# Patient Record
Sex: Male | Born: 1950 | Race: White | Hispanic: No | Marital: Married | Smoking: Former smoker
Health system: Southern US, Community
[De-identification: ages and names within clinical notes are randomized; demographics above are authoritative.]

## PROBLEM LIST (undated history)

## (undated) DIAGNOSIS — E785 Hyperlipidemia, unspecified: Secondary | ICD-10-CM

## (undated) DIAGNOSIS — R51 Headache: Secondary | ICD-10-CM

## (undated) DIAGNOSIS — N390 Urinary tract infection, site not specified: Secondary | ICD-10-CM

## (undated) DIAGNOSIS — T4145XA Adverse effect of unspecified anesthetic, initial encounter: Secondary | ICD-10-CM

## (undated) DIAGNOSIS — M5136 Other intervertebral disc degeneration, lumbar region: Secondary | ICD-10-CM

## (undated) DIAGNOSIS — R29818 Other symptoms and signs involving the nervous system: Secondary | ICD-10-CM

## (undated) DIAGNOSIS — Z8614 Personal history of Methicillin resistant Staphylococcus aureus infection: Secondary | ICD-10-CM

## (undated) DIAGNOSIS — R29898 Other symptoms and signs involving the musculoskeletal system: Secondary | ICD-10-CM

## (undated) DIAGNOSIS — G894 Chronic pain syndrome: Secondary | ICD-10-CM

## (undated) DIAGNOSIS — I739 Peripheral vascular disease, unspecified: Secondary | ICD-10-CM

## (undated) DIAGNOSIS — I5032 Chronic diastolic (congestive) heart failure: Secondary | ICD-10-CM

## (undated) DIAGNOSIS — N401 Enlarged prostate with lower urinary tract symptoms: Secondary | ICD-10-CM

## (undated) DIAGNOSIS — Z8701 Personal history of pneumonia (recurrent): Secondary | ICD-10-CM

## (undated) DIAGNOSIS — F329 Major depressive disorder, single episode, unspecified: Secondary | ICD-10-CM

## (undated) DIAGNOSIS — R0609 Other forms of dyspnea: Secondary | ICD-10-CM

## (undated) DIAGNOSIS — K000 Anodontia: Secondary | ICD-10-CM

## (undated) DIAGNOSIS — Z87442 Personal history of urinary calculi: Secondary | ICD-10-CM

## (undated) DIAGNOSIS — G479 Sleep disorder, unspecified: Secondary | ICD-10-CM

## (undated) DIAGNOSIS — K219 Gastro-esophageal reflux disease without esophagitis: Secondary | ICD-10-CM

## (undated) DIAGNOSIS — K760 Fatty (change of) liver, not elsewhere classified: Secondary | ICD-10-CM

## (undated) DIAGNOSIS — M1990 Unspecified osteoarthritis, unspecified site: Secondary | ICD-10-CM

## (undated) DIAGNOSIS — I6529 Occlusion and stenosis of unspecified carotid artery: Secondary | ICD-10-CM

## (undated) DIAGNOSIS — I10 Essential (primary) hypertension: Secondary | ICD-10-CM

## (undated) DIAGNOSIS — J449 Chronic obstructive pulmonary disease, unspecified: Secondary | ICD-10-CM

## (undated) DIAGNOSIS — N281 Cyst of kidney, acquired: Secondary | ICD-10-CM

## (undated) DIAGNOSIS — C801 Malignant (primary) neoplasm, unspecified: Secondary | ICD-10-CM

## (undated) DIAGNOSIS — M48062 Spinal stenosis, lumbar region with neurogenic claudication: Secondary | ICD-10-CM

## (undated) DIAGNOSIS — Z8639 Personal history of other endocrine, nutritional and metabolic disease: Secondary | ICD-10-CM

## (undated) DIAGNOSIS — Z9189 Other specified personal risk factors, not elsewhere classified: Secondary | ICD-10-CM

## (undated) DIAGNOSIS — G9519 Other vascular myelopathies: Secondary | ICD-10-CM

## (undated) DIAGNOSIS — A048 Other specified bacterial intestinal infections: Secondary | ICD-10-CM

## (undated) DIAGNOSIS — G629 Polyneuropathy, unspecified: Secondary | ICD-10-CM

## (undated) DIAGNOSIS — Z89519 Acquired absence of unspecified leg below knee: Secondary | ICD-10-CM

## (undated) DIAGNOSIS — T8859XA Other complications of anesthesia, initial encounter: Secondary | ICD-10-CM

## (undated) DIAGNOSIS — K08109 Complete loss of teeth, unspecified cause, unspecified class: Secondary | ICD-10-CM

## (undated) DIAGNOSIS — N529 Male erectile dysfunction, unspecified: Secondary | ICD-10-CM

## (undated) DIAGNOSIS — G8929 Other chronic pain: Secondary | ICD-10-CM

## (undated) DIAGNOSIS — K449 Diaphragmatic hernia without obstruction or gangrene: Secondary | ICD-10-CM

## (undated) DIAGNOSIS — R600 Localized edema: Secondary | ICD-10-CM

## (undated) DIAGNOSIS — K3184 Gastroparesis: Secondary | ICD-10-CM

## (undated) DIAGNOSIS — Z993 Dependence on wheelchair: Secondary | ICD-10-CM

## (undated) DIAGNOSIS — K222 Esophageal obstruction: Secondary | ICD-10-CM

## (undated) DIAGNOSIS — R519 Headache, unspecified: Secondary | ICD-10-CM

## (undated) DIAGNOSIS — N200 Calculus of kidney: Secondary | ICD-10-CM

## (undated) DIAGNOSIS — T884XXA Failed or difficult intubation, initial encounter: Secondary | ICD-10-CM

## (undated) DIAGNOSIS — E119 Type 2 diabetes mellitus without complications: Secondary | ICD-10-CM

## (undated) DIAGNOSIS — T7840XA Allergy, unspecified, initial encounter: Secondary | ICD-10-CM

## (undated) DIAGNOSIS — F419 Anxiety disorder, unspecified: Secondary | ICD-10-CM

## (undated) DIAGNOSIS — M51369 Other intervertebral disc degeneration, lumbar region without mention of lumbar back pain or lower extremity pain: Secondary | ICD-10-CM

## (undated) DIAGNOSIS — Z8619 Personal history of other infectious and parasitic diseases: Secondary | ICD-10-CM

## (undated) DIAGNOSIS — M722 Plantar fascial fibromatosis: Secondary | ICD-10-CM

## (undated) DIAGNOSIS — G5600 Carpal tunnel syndrome, unspecified upper limb: Secondary | ICD-10-CM

## (undated) DIAGNOSIS — F32A Depression, unspecified: Secondary | ICD-10-CM

## (undated) DIAGNOSIS — I2510 Atherosclerotic heart disease of native coronary artery without angina pectoris: Secondary | ICD-10-CM

## (undated) DIAGNOSIS — Z7409 Other reduced mobility: Secondary | ICD-10-CM

## (undated) DIAGNOSIS — N400 Enlarged prostate without lower urinary tract symptoms: Secondary | ICD-10-CM

## (undated) HISTORY — DX: Occlusion and stenosis of unspecified carotid artery: I65.29

## (undated) HISTORY — DX: Benign prostatic hyperplasia without lower urinary tract symptoms: N40.0

## (undated) HISTORY — DX: Allergy, unspecified, initial encounter: T78.40XA

## (undated) HISTORY — PX: TONSILLECTOMY: SUR1361

## (undated) HISTORY — DX: Urinary tract infection, site not specified: N39.0

## (undated) HISTORY — DX: Other specified bacterial intestinal infections: A04.8

## (undated) HISTORY — PX: ESOPHAGOGASTRODUODENOSCOPY (EGD) WITH ESOPHAGEAL DILATION: SHX5812

## (undated) HISTORY — PX: CATARACT EXTRACTION W/ INTRAOCULAR LENS  IMPLANT, BILATERAL: SHX1307

## (undated) HISTORY — DX: Carpal tunnel syndrome, unspecified upper limb: G56.00

## (undated) HISTORY — DX: Headache, unspecified: R51.9

## (undated) HISTORY — DX: Plantar fascial fibromatosis: M72.2

## (undated) HISTORY — PX: OTHER SURGICAL HISTORY: SHX169

## (undated) HISTORY — PX: CARDIOVASCULAR STRESS TEST: SHX262

## (undated) HISTORY — PX: PARATHYROIDECTOMY: SHX19

## (undated) HISTORY — PX: LUMBAR DISC SURGERY: SHX700

## (undated) HISTORY — PX: ESOPHAGOGASTRECTOMY: SHX6315

## (undated) HISTORY — DX: Fatty (change of) liver, not elsewhere classified: K76.0

## (undated) HISTORY — DX: Headache: R51

## (undated) HISTORY — DX: Other chronic pain: G89.29

## (undated) HISTORY — DX: Anxiety disorder, unspecified: F41.9

## (undated) HISTORY — PX: APPENDECTOMY: SHX54

---

## 1999-05-07 ENCOUNTER — Emergency Department (HOSPITAL_COMMUNITY): Admission: EM | Admit: 1999-05-07 | Discharge: 1999-05-07 | Payer: Self-pay | Admitting: Emergency Medicine

## 1999-05-15 ENCOUNTER — Encounter: Payer: Self-pay | Admitting: General Surgery

## 1999-05-15 ENCOUNTER — Ambulatory Visit (HOSPITAL_COMMUNITY): Admission: RE | Admit: 1999-05-15 | Discharge: 1999-05-15 | Payer: Self-pay | Admitting: General Surgery

## 1999-05-16 ENCOUNTER — Encounter: Payer: Self-pay | Admitting: General Surgery

## 1999-05-16 ENCOUNTER — Ambulatory Visit (HOSPITAL_COMMUNITY): Admission: RE | Admit: 1999-05-16 | Discharge: 1999-05-16 | Payer: Self-pay | Admitting: General Surgery

## 2000-05-25 ENCOUNTER — Encounter: Payer: Self-pay | Admitting: Endocrinology

## 2000-05-25 ENCOUNTER — Ambulatory Visit (HOSPITAL_COMMUNITY): Admission: RE | Admit: 2000-05-25 | Discharge: 2000-05-25 | Payer: Self-pay | Admitting: Endocrinology

## 2000-09-03 ENCOUNTER — Encounter: Payer: Self-pay | Admitting: Neurosurgery

## 2000-09-03 ENCOUNTER — Encounter: Admission: RE | Admit: 2000-09-03 | Discharge: 2000-09-03 | Payer: Self-pay | Admitting: Neurosurgery

## 2002-05-19 ENCOUNTER — Encounter: Payer: Self-pay | Admitting: Endocrinology

## 2002-05-19 ENCOUNTER — Encounter: Admission: RE | Admit: 2002-05-19 | Discharge: 2002-05-19 | Payer: Self-pay | Admitting: Endocrinology

## 2002-09-04 ENCOUNTER — Emergency Department (HOSPITAL_COMMUNITY): Admission: EM | Admit: 2002-09-04 | Discharge: 2002-09-04 | Payer: Self-pay | Admitting: Emergency Medicine

## 2002-09-04 ENCOUNTER — Encounter: Payer: Self-pay | Admitting: Emergency Medicine

## 2003-04-12 ENCOUNTER — Inpatient Hospital Stay (HOSPITAL_COMMUNITY): Admission: AD | Admit: 2003-04-12 | Discharge: 2003-04-14 | Payer: Self-pay | Admitting: Family Medicine

## 2003-04-12 ENCOUNTER — Encounter: Payer: Self-pay | Admitting: Family Medicine

## 2003-04-14 ENCOUNTER — Encounter: Admission: RE | Admit: 2003-04-14 | Discharge: 2003-04-14 | Payer: Self-pay | Admitting: Family Medicine

## 2004-07-16 ENCOUNTER — Ambulatory Visit: Payer: Self-pay | Admitting: Internal Medicine

## 2004-07-24 ENCOUNTER — Ambulatory Visit: Payer: Self-pay | Admitting: Endocrinology

## 2004-08-05 ENCOUNTER — Ambulatory Visit: Payer: Self-pay | Admitting: Internal Medicine

## 2004-08-05 LAB — HM COLONOSCOPY: HM Colonoscopy: NORMAL

## 2004-08-14 ENCOUNTER — Ambulatory Visit: Payer: Self-pay | Admitting: Endocrinology

## 2005-03-20 ENCOUNTER — Ambulatory Visit: Payer: Self-pay | Admitting: Endocrinology

## 2005-04-03 ENCOUNTER — Ambulatory Visit: Payer: Self-pay | Admitting: Endocrinology

## 2005-04-08 ENCOUNTER — Ambulatory Visit: Payer: Self-pay | Admitting: Cardiology

## 2005-04-15 ENCOUNTER — Ambulatory Visit: Payer: Self-pay | Admitting: Endocrinology

## 2005-05-05 ENCOUNTER — Ambulatory Visit: Payer: Self-pay | Admitting: Endocrinology

## 2005-05-08 ENCOUNTER — Ambulatory Visit (HOSPITAL_COMMUNITY): Admission: RE | Admit: 2005-05-08 | Discharge: 2005-05-08 | Payer: Self-pay | Admitting: Otolaryngology

## 2005-05-08 ENCOUNTER — Ambulatory Visit (HOSPITAL_BASED_OUTPATIENT_CLINIC_OR_DEPARTMENT_OTHER): Admission: RE | Admit: 2005-05-08 | Discharge: 2005-05-08 | Payer: Self-pay | Admitting: Otolaryngology

## 2005-05-08 ENCOUNTER — Encounter (INDEPENDENT_AMBULATORY_CARE_PROVIDER_SITE_OTHER): Payer: Self-pay | Admitting: *Deleted

## 2005-05-26 ENCOUNTER — Ambulatory Visit: Payer: Self-pay | Admitting: Endocrinology

## 2005-06-04 ENCOUNTER — Encounter: Admission: RE | Admit: 2005-06-04 | Discharge: 2005-06-04 | Payer: Self-pay | Admitting: Otolaryngology

## 2005-07-21 ENCOUNTER — Ambulatory Visit: Payer: Self-pay | Admitting: Endocrinology

## 2005-07-22 ENCOUNTER — Ambulatory Visit: Payer: Self-pay | Admitting: Endocrinology

## 2005-09-15 HISTORY — PX: CARPAL TUNNEL RELEASE: SHX101

## 2005-09-15 HISTORY — PX: KNEE ARTHROSCOPY: SUR90

## 2006-01-01 ENCOUNTER — Ambulatory Visit: Payer: Self-pay | Admitting: Endocrinology

## 2006-01-29 ENCOUNTER — Ambulatory Visit: Payer: Self-pay | Admitting: Endocrinology

## 2006-02-16 ENCOUNTER — Encounter: Admission: RE | Admit: 2006-02-16 | Discharge: 2006-02-16 | Payer: Self-pay | Admitting: Orthopedic Surgery

## 2006-04-02 ENCOUNTER — Ambulatory Visit: Payer: Self-pay | Admitting: Endocrinology

## 2006-05-14 ENCOUNTER — Ambulatory Visit (HOSPITAL_BASED_OUTPATIENT_CLINIC_OR_DEPARTMENT_OTHER): Admission: RE | Admit: 2006-05-14 | Discharge: 2006-05-14 | Payer: Self-pay | Admitting: Orthopedic Surgery

## 2006-05-14 HISTORY — PX: TRIGGER FINGER RELEASE: SHX641

## 2006-09-28 ENCOUNTER — Ambulatory Visit: Payer: Self-pay | Admitting: Endocrinology

## 2006-09-28 LAB — CONVERTED CEMR LAB
CO2: 27 meq/L (ref 19–32)
Calcium: 9 mg/dL (ref 8.4–10.5)
Chloride: 100 meq/L (ref 96–112)
GFR calc non Af Amer: 74 mL/min
Glucose, Bld: 402 mg/dL — ABNORMAL HIGH (ref 70–99)
Pro B Natriuretic peptide (BNP): 13 pg/mL (ref 0.0–100.0)

## 2006-10-01 ENCOUNTER — Ambulatory Visit: Payer: Self-pay

## 2006-10-02 ENCOUNTER — Ambulatory Visit: Payer: Self-pay

## 2006-10-05 ENCOUNTER — Ambulatory Visit: Payer: Self-pay | Admitting: Endocrinology

## 2006-11-04 ENCOUNTER — Ambulatory Visit: Payer: Self-pay | Admitting: Endocrinology

## 2006-11-24 ENCOUNTER — Ambulatory Visit: Payer: Self-pay | Admitting: Vascular Surgery

## 2006-11-24 ENCOUNTER — Ambulatory Visit (HOSPITAL_COMMUNITY): Admission: RE | Admit: 2006-11-24 | Discharge: 2006-11-24 | Payer: Self-pay | Admitting: Endocrinology

## 2006-11-24 ENCOUNTER — Ambulatory Visit: Payer: Self-pay | Admitting: Endocrinology

## 2007-01-06 ENCOUNTER — Ambulatory Visit (HOSPITAL_COMMUNITY): Admission: RE | Admit: 2007-01-06 | Discharge: 2007-01-06 | Payer: Self-pay | Admitting: Surgery

## 2007-01-08 ENCOUNTER — Ambulatory Visit (HOSPITAL_COMMUNITY): Admission: RE | Admit: 2007-01-08 | Discharge: 2007-01-08 | Payer: Self-pay | Admitting: Surgery

## 2007-01-12 ENCOUNTER — Encounter: Admission: RE | Admit: 2007-01-12 | Discharge: 2007-04-12 | Payer: Self-pay | Admitting: Internal Medicine

## 2007-03-29 ENCOUNTER — Encounter: Payer: Self-pay | Admitting: Endocrinology

## 2007-03-29 DIAGNOSIS — I10 Essential (primary) hypertension: Secondary | ICD-10-CM

## 2007-04-19 ENCOUNTER — Ambulatory Visit: Payer: Self-pay | Admitting: Endocrinology

## 2007-07-14 ENCOUNTER — Ambulatory Visit: Payer: Self-pay | Admitting: Endocrinology

## 2007-08-17 ENCOUNTER — Encounter: Payer: Self-pay | Admitting: Endocrinology

## 2007-09-16 HISTORY — PX: OTHER SURGICAL HISTORY: SHX169

## 2007-09-16 HISTORY — PX: TRACHEOSTOMY: SUR1362

## 2007-10-18 ENCOUNTER — Encounter: Payer: Self-pay | Admitting: Endocrinology

## 2007-10-18 ENCOUNTER — Ambulatory Visit: Payer: Self-pay | Admitting: Endocrinology

## 2007-10-19 LAB — CONVERTED CEMR LAB
BUN: 10 mg/dL (ref 6–23)
CO2: 29 meq/L (ref 19–32)
Calcium: 9.2 mg/dL (ref 8.4–10.5)
Creatinine, Ser: 0.9 mg/dL (ref 0.4–1.5)
GFR calc Af Amer: 113 mL/min
Hgb A1c MFr Bld: 8.5 % — ABNORMAL HIGH (ref 4.6–6.0)
Sed Rate: 34 mm/hr — ABNORMAL HIGH (ref 0–20)

## 2008-01-21 ENCOUNTER — Ambulatory Visit: Payer: Self-pay | Admitting: Endocrinology

## 2008-01-21 DIAGNOSIS — E210 Primary hyperparathyroidism: Secondary | ICD-10-CM

## 2008-01-21 DIAGNOSIS — M5137 Other intervertebral disc degeneration, lumbosacral region: Secondary | ICD-10-CM

## 2008-01-23 LAB — CONVERTED CEMR LAB
AST: 30 units/L (ref 0–37)
Albumin: 3.7 g/dL (ref 3.5–5.2)
Alkaline Phosphatase: 99 units/L (ref 39–117)
BUN: 15 mg/dL (ref 6–23)
Bacteria, UA: NEGATIVE
Basophils Absolute: 0.1 10*3/uL (ref 0.0–0.1)
Basophils Relative: 0.5 % (ref 0.0–1.0)
Bilirubin Urine: NEGATIVE
Cholesterol: 102 mg/dL (ref 0–200)
Creatinine, Ser: 1 mg/dL (ref 0.4–1.5)
Creatinine,U: 61.2 mg/dL
Crystals: NEGATIVE
Eosinophils Absolute: 0.2 10*3/uL (ref 0.0–0.7)
Eosinophils Relative: 1.3 % (ref 0.0–5.0)
GFR calc Af Amer: 100 mL/min
GFR calc non Af Amer: 83 mL/min
Glucose, Bld: 61 mg/dL — ABNORMAL LOW (ref 70–99)
HCT: 43.3 % (ref 39.0–52.0)
Hemoglobin, Urine: NEGATIVE
Hemoglobin: 14.5 g/dL (ref 13.0–17.0)
Hgb A1c MFr Bld: 9.2 % — ABNORMAL HIGH (ref 4.6–6.0)
Ketones, ur: NEGATIVE mg/dL
Leukocytes, UA: NEGATIVE
MCHC: 33.5 g/dL (ref 30.0–36.0)
MCV: 90.7 fL (ref 78.0–100.0)
Microalb Creat Ratio: 27.8 mg/g (ref 0.0–30.0)
Monocytes Absolute: 1.4 10*3/uL — ABNORMAL HIGH (ref 0.1–1.0)
Neutro Abs: 5.3 10*3/uL (ref 1.4–7.7)
Neutrophils Relative %: 39.4 % — ABNORMAL LOW (ref 43.0–77.0)
Potassium: 4.1 meq/L (ref 3.5–5.1)
RBC: 4.78 M/uL (ref 4.22–5.81)
Total Protein: 6.8 g/dL (ref 6.0–8.3)
Urine Glucose: NEGATIVE mg/dL
Urobilinogen, UA: 0.2 (ref 0.0–1.0)
VLDL: 17 mg/dL (ref 0–40)

## 2008-01-31 ENCOUNTER — Encounter: Admission: RE | Admit: 2008-01-31 | Discharge: 2008-01-31 | Payer: Self-pay | Admitting: Endocrinology

## 2008-02-09 ENCOUNTER — Telehealth (INDEPENDENT_AMBULATORY_CARE_PROVIDER_SITE_OTHER): Payer: Self-pay | Admitting: *Deleted

## 2008-02-14 ENCOUNTER — Encounter (INDEPENDENT_AMBULATORY_CARE_PROVIDER_SITE_OTHER): Payer: Self-pay | Admitting: *Deleted

## 2008-02-29 ENCOUNTER — Telehealth: Payer: Self-pay | Admitting: Endocrinology

## 2008-03-24 ENCOUNTER — Encounter
Admission: RE | Admit: 2008-03-24 | Discharge: 2008-04-10 | Payer: Self-pay | Admitting: Physical Medicine & Rehabilitation

## 2008-03-27 ENCOUNTER — Ambulatory Visit: Payer: Self-pay | Admitting: Physical Medicine & Rehabilitation

## 2008-04-10 ENCOUNTER — Ambulatory Visit: Payer: Self-pay | Admitting: Physical Medicine & Rehabilitation

## 2008-04-20 ENCOUNTER — Ambulatory Visit: Payer: Self-pay | Admitting: Endocrinology

## 2008-04-20 LAB — CONVERTED CEMR LAB: Hgb A1c MFr Bld: 9.7 % — ABNORMAL HIGH (ref 4.6–6.0)

## 2008-07-18 ENCOUNTER — Encounter: Payer: Self-pay | Admitting: Endocrinology

## 2008-09-18 ENCOUNTER — Ambulatory Visit: Payer: Self-pay | Admitting: Endocrinology

## 2008-09-18 DIAGNOSIS — E7800 Pure hypercholesterolemia, unspecified: Secondary | ICD-10-CM

## 2008-09-18 LAB — CONVERTED CEMR LAB
Albumin: 3.2 g/dL — ABNORMAL LOW (ref 3.5–5.2)
BUN: 9 mg/dL (ref 6–23)
Calcium: 10 mg/dL (ref 8.4–10.5)
Cholesterol: 102 mg/dL (ref 0–200)
Creatinine, Ser: 0.9 mg/dL (ref 0.4–1.5)
GFR calc Af Amer: 113 mL/min
GFR calc non Af Amer: 93 mL/min
Glucose, Bld: 136 mg/dL — ABNORMAL HIGH (ref 70–99)
HDL: 31.1 mg/dL — ABNORMAL LOW (ref >39.0)
Hgb A1c MFr Bld: 7.7 % — ABNORMAL HIGH (ref 4.6–6.0)
LDL Cholesterol: 44 mg/dL (ref 0–99)
Total Protein: 6.7 g/dL (ref 6.0–8.3)
VLDL: 27 mg/dL (ref 0–40)

## 2008-12-14 ENCOUNTER — Inpatient Hospital Stay: Admission: AD | Admit: 2008-12-14 | Discharge: 2009-01-09 | Payer: Self-pay | Admitting: Internal Medicine

## 2009-04-24 ENCOUNTER — Telehealth: Payer: Self-pay | Admitting: Endocrinology

## 2009-05-02 ENCOUNTER — Telehealth (INDEPENDENT_AMBULATORY_CARE_PROVIDER_SITE_OTHER): Payer: Self-pay | Admitting: *Deleted

## 2009-05-04 ENCOUNTER — Encounter: Payer: Self-pay | Admitting: Internal Medicine

## 2009-05-04 ENCOUNTER — Telehealth: Payer: Self-pay | Admitting: Internal Medicine

## 2009-05-14 ENCOUNTER — Encounter: Payer: Self-pay | Admitting: Endocrinology

## 2009-05-17 ENCOUNTER — Telehealth: Payer: Self-pay | Admitting: Endocrinology

## 2009-05-17 ENCOUNTER — Ambulatory Visit: Payer: Self-pay | Admitting: Endocrinology

## 2009-05-17 DIAGNOSIS — F329 Major depressive disorder, single episode, unspecified: Secondary | ICD-10-CM

## 2009-05-17 LAB — CONVERTED CEMR LAB
Creatinine,U: 170.9 mg/dL
Hgb A1c MFr Bld: 6.4 % (ref 4.6–6.5)
Microalb, Ur: 1.5 mg/dL (ref 0.0–1.9)

## 2009-05-30 ENCOUNTER — Ambulatory Visit: Payer: Self-pay | Admitting: *Deleted

## 2009-06-15 ENCOUNTER — Encounter: Payer: Self-pay | Admitting: Endocrinology

## 2009-06-15 ENCOUNTER — Telehealth: Payer: Self-pay | Admitting: Endocrinology

## 2009-06-21 ENCOUNTER — Ambulatory Visit: Payer: Self-pay | Admitting: Endocrinology

## 2009-06-21 DIAGNOSIS — M86169 Other acute osteomyelitis, unspecified tibia and fibula: Secondary | ICD-10-CM | POA: Insufficient documentation

## 2009-06-22 HISTORY — PX: BELOW KNEE LEG AMPUTATION: SUR23

## 2009-07-09 ENCOUNTER — Inpatient Hospital Stay (HOSPITAL_COMMUNITY)
Admission: RE | Admit: 2009-07-09 | Discharge: 2009-07-17 | Payer: Self-pay | Admitting: Physical Medicine & Rehabilitation

## 2009-07-09 ENCOUNTER — Ambulatory Visit: Payer: Self-pay | Admitting: Physical Medicine & Rehabilitation

## 2009-07-17 ENCOUNTER — Encounter: Payer: Self-pay | Admitting: Endocrinology

## 2009-08-21 HISTORY — PX: STUMP REVISION: SHX6102

## 2009-09-05 ENCOUNTER — Encounter: Payer: Self-pay | Admitting: Endocrinology

## 2009-09-10 ENCOUNTER — Encounter: Payer: Self-pay | Admitting: Endocrinology

## 2009-09-28 ENCOUNTER — Encounter: Payer: Self-pay | Admitting: Endocrinology

## 2009-10-01 ENCOUNTER — Encounter: Payer: Self-pay | Admitting: Endocrinology

## 2009-10-02 ENCOUNTER — Encounter: Payer: Self-pay | Admitting: Endocrinology

## 2009-10-11 ENCOUNTER — Ambulatory Visit: Payer: Self-pay | Admitting: Endocrinology

## 2009-10-11 ENCOUNTER — Telehealth (INDEPENDENT_AMBULATORY_CARE_PROVIDER_SITE_OTHER): Payer: Self-pay | Admitting: *Deleted

## 2009-10-11 DIAGNOSIS — D509 Iron deficiency anemia, unspecified: Secondary | ICD-10-CM | POA: Insufficient documentation

## 2009-11-12 ENCOUNTER — Ambulatory Visit: Payer: Self-pay | Admitting: Endocrinology

## 2009-11-12 DIAGNOSIS — I739 Peripheral vascular disease, unspecified: Secondary | ICD-10-CM

## 2009-11-12 DIAGNOSIS — K209 Esophagitis, unspecified without bleeding: Secondary | ICD-10-CM | POA: Insufficient documentation

## 2009-11-12 DIAGNOSIS — A4902 Methicillin resistant Staphylococcus aureus infection, unspecified site: Secondary | ICD-10-CM | POA: Insufficient documentation

## 2009-11-12 LAB — CONVERTED CEMR LAB
AST: 14 units/L (ref 0–37)
BUN: 9 mg/dL (ref 6–23)
Basophils Absolute: 0.1 10*3/uL (ref 0.0–0.1)
Bilirubin Urine: NEGATIVE
Bilirubin, Direct: 0.1 mg/dL (ref 0.0–0.3)
Calcium: 9.2 mg/dL (ref 8.4–10.5)
Cholesterol: 136 mg/dL (ref 0–200)
Creatinine, Ser: 0.6 mg/dL (ref 0.4–1.5)
GFR calc non Af Amer: 147.72 mL/min (ref >60)
Glucose, Bld: 191 mg/dL — ABNORMAL HIGH (ref 70–99)
HCT: 32 % — ABNORMAL LOW (ref 39.0–52.0)
HDL: 38.2 mg/dL — ABNORMAL LOW (ref >39.00)
Hemoglobin, Urine: NEGATIVE
Iron: 62 ug/dL (ref 42–165)
LDL Cholesterol: 65 mg/dL (ref 0–99)
Lymphs Abs: 3.4 10*3/uL (ref 0.7–4.0)
Microalb Creat Ratio: 6.5 mg/g (ref 0.0–30.0)
Monocytes Relative: 6.9 % (ref 3.0–12.0)
Neutrophils Relative %: 41.1 % — ABNORMAL LOW (ref 43.0–77.0)
Platelets: 468 10*3/uL — ABNORMAL HIGH (ref 150.0–400.0)
Potassium: 4 meq/L (ref 3.5–5.1)
RDW: 23.8 % — ABNORMAL HIGH (ref 11.5–14.6)
TSH: 0.81 microintl units/mL (ref 0.35–5.50)
Total Bilirubin: 0.3 mg/dL (ref 0.3–1.2)
Total Protein, Urine: NEGATIVE mg/dL
Urine Glucose: NEGATIVE mg/dL
VLDL: 33.2 mg/dL (ref 0.0–40.0)

## 2009-11-19 ENCOUNTER — Ambulatory Visit: Payer: Self-pay | Admitting: Cardiovascular Disease

## 2009-12-05 ENCOUNTER — Encounter: Payer: Self-pay | Admitting: Endocrinology

## 2009-12-20 ENCOUNTER — Encounter: Payer: Self-pay | Admitting: Endocrinology

## 2009-12-21 ENCOUNTER — Telehealth (INDEPENDENT_AMBULATORY_CARE_PROVIDER_SITE_OTHER): Payer: Self-pay | Admitting: *Deleted

## 2009-12-25 ENCOUNTER — Encounter: Payer: Self-pay | Admitting: Endocrinology

## 2009-12-25 ENCOUNTER — Telehealth: Payer: Self-pay | Admitting: Endocrinology

## 2009-12-25 ENCOUNTER — Encounter: Admission: RE | Admit: 2009-12-25 | Discharge: 2010-03-25 | Payer: Self-pay | Admitting: Endocrinology

## 2009-12-31 ENCOUNTER — Telehealth (INDEPENDENT_AMBULATORY_CARE_PROVIDER_SITE_OTHER): Payer: Self-pay | Admitting: *Deleted

## 2010-01-10 ENCOUNTER — Ambulatory Visit: Payer: Self-pay | Admitting: Endocrinology

## 2010-01-10 DIAGNOSIS — M25519 Pain in unspecified shoulder: Secondary | ICD-10-CM

## 2010-01-10 LAB — CONVERTED CEMR LAB
Basophils Absolute: 0.1 10*3/uL (ref 0.0–0.1)
Hgb A1c MFr Bld: 7.9 % — ABNORMAL HIGH (ref 4.6–6.5)
Lymphocytes Relative: 39.4 % (ref 12.0–46.0)
Lymphs Abs: 2.8 10*3/uL (ref 0.7–4.0)
Monocytes Relative: 6.8 % (ref 3.0–12.0)
Neutrophils Relative %: 48.7 % (ref 43.0–77.0)
Platelets: 323 10*3/uL (ref 150.0–400.0)
RDW: 27.7 % — ABNORMAL HIGH (ref 11.5–14.6)
Saturation Ratios: 36.5 % (ref 20.0–50.0)
Transferrin: 290 mg/dL (ref 212.0–360.0)

## 2010-01-11 ENCOUNTER — Encounter: Payer: Self-pay | Admitting: Endocrinology

## 2010-01-14 ENCOUNTER — Telehealth: Payer: Self-pay | Admitting: Endocrinology

## 2010-01-21 ENCOUNTER — Telehealth (INDEPENDENT_AMBULATORY_CARE_PROVIDER_SITE_OTHER): Payer: Self-pay | Admitting: *Deleted

## 2010-01-21 ENCOUNTER — Encounter: Payer: Self-pay | Admitting: Endocrinology

## 2010-01-22 ENCOUNTER — Telehealth (INDEPENDENT_AMBULATORY_CARE_PROVIDER_SITE_OTHER): Payer: Self-pay | Admitting: *Deleted

## 2010-01-23 ENCOUNTER — Encounter: Payer: Self-pay | Admitting: Endocrinology

## 2010-02-12 ENCOUNTER — Telehealth (INDEPENDENT_AMBULATORY_CARE_PROVIDER_SITE_OTHER): Payer: Self-pay | Admitting: *Deleted

## 2010-02-26 ENCOUNTER — Encounter: Payer: Self-pay | Admitting: Endocrinology

## 2010-03-13 ENCOUNTER — Encounter: Payer: Self-pay | Admitting: Endocrinology

## 2010-04-13 ENCOUNTER — Encounter: Payer: Self-pay | Admitting: Endocrinology

## 2010-04-29 ENCOUNTER — Encounter: Payer: Self-pay | Admitting: Endocrinology

## 2010-05-13 ENCOUNTER — Encounter: Payer: Self-pay | Admitting: Endocrinology

## 2010-05-13 ENCOUNTER — Ambulatory Visit: Payer: Self-pay | Admitting: Endocrinology

## 2010-05-13 DIAGNOSIS — R071 Chest pain on breathing: Secondary | ICD-10-CM

## 2010-05-13 LAB — CONVERTED CEMR LAB
Basophils Absolute: 0 10*3/uL (ref 0.0–0.1)
Hemoglobin: 9.1 g/dL — ABNORMAL LOW (ref 13.0–17.0)
Lymphocytes Relative: 40.5 % (ref 12.0–46.0)
Monocytes Relative: 7.8 % (ref 3.0–12.0)
Neutro Abs: 3.4 10*3/uL (ref 1.4–7.7)
PTH: 39.7 pg/mL (ref 14.0–72.0)
RBC: 2.91 M/uL — ABNORMAL LOW (ref 4.22–5.81)
RDW: 19 % — ABNORMAL HIGH (ref 11.5–14.6)
Saturation Ratios: 16.6 % — ABNORMAL LOW (ref 20.0–50.0)
Vitamin B-12: 581 pg/mL (ref 211–911)

## 2010-05-24 ENCOUNTER — Encounter: Payer: Self-pay | Admitting: Endocrinology

## 2010-07-01 ENCOUNTER — Ambulatory Visit: Payer: Self-pay | Admitting: Endocrinology

## 2010-08-01 ENCOUNTER — Ambulatory Visit: Payer: Self-pay | Admitting: Endocrinology

## 2010-09-02 ENCOUNTER — Ambulatory Visit: Payer: Self-pay | Admitting: Endocrinology

## 2010-09-02 DIAGNOSIS — N529 Male erectile dysfunction, unspecified: Secondary | ICD-10-CM

## 2010-10-01 ENCOUNTER — Ambulatory Visit
Admission: RE | Admit: 2010-10-01 | Discharge: 2010-10-01 | Payer: Self-pay | Source: Home / Self Care | Attending: Endocrinology | Admitting: Endocrinology

## 2010-10-01 ENCOUNTER — Other Ambulatory Visit: Payer: Self-pay | Admitting: Endocrinology

## 2010-10-01 ENCOUNTER — Encounter: Payer: Self-pay | Admitting: Endocrinology

## 2010-10-01 ENCOUNTER — Ambulatory Visit: Admission: RE | Admit: 2010-10-01 | Discharge: 2010-10-01 | Payer: Self-pay | Source: Home / Self Care

## 2010-10-01 DIAGNOSIS — R9389 Abnormal findings on diagnostic imaging of other specified body structures: Secondary | ICD-10-CM | POA: Insufficient documentation

## 2010-10-01 DIAGNOSIS — M7989 Other specified soft tissue disorders: Secondary | ICD-10-CM | POA: Insufficient documentation

## 2010-10-01 LAB — BASIC METABOLIC PANEL
BUN: 12 mg/dL (ref 6–23)
CO2: 24 mEq/L (ref 19–32)
Calcium: 8.7 mg/dL (ref 8.4–10.5)
Chloride: 108 mEq/L (ref 96–112)
Creatinine, Ser: 0.8 mg/dL (ref 0.4–1.5)
GFR: 101.26 mL/min (ref >60.00)
Glucose, Bld: 313 mg/dL — ABNORMAL HIGH (ref 70–99)
Potassium: 3.9 mEq/L (ref 3.5–5.1)
Sodium: 141 mEq/L (ref 135–145)

## 2010-10-01 LAB — CONVERTED CEMR LAB
Basophils Absolute: 0 10*3/uL (ref 0.0–0.1)
Basophils Relative: 0 % (ref 0–1)
Eosinophils Absolute: 0.1 10*3/uL (ref 0.0–0.7)
Eosinophils Relative: 2 % (ref 0–5)
HCT: 31.3 % — ABNORMAL LOW (ref 39.0–52.0)
Hemoglobin: 10.4 g/dL — ABNORMAL LOW (ref 13.0–17.0)
MCHC: 33.2 g/dL (ref 30.0–36.0)
MCV: 94.3 fL (ref 78.0–100.0)
Monocytes Absolute: 0.5 10*3/uL (ref 0.1–1.0)
Neutro Abs: 2 10*3/uL (ref 1.7–7.7)
RDW: 16.8 % — ABNORMAL HIGH (ref 11.5–15.5)

## 2010-10-01 LAB — IBC PANEL
Iron: 115 ug/dL (ref 42–165)
Saturation Ratios: 27.8 % (ref 20.0–50.0)
Transferrin: 295.1 mg/dL (ref 212.0–360.0)

## 2010-10-01 LAB — BRAIN NATRIURETIC PEPTIDE: Pro B Natriuretic peptide (BNP): 47.3 pg/mL (ref 0.0–100.0)

## 2010-10-01 LAB — HEMOGLOBIN A1C: Hgb A1c MFr Bld: 8.5 % — ABNORMAL HIGH (ref 4.6–6.5)

## 2010-10-02 ENCOUNTER — Telehealth: Payer: Self-pay | Admitting: Endocrinology

## 2010-10-15 NOTE — Letter (Signed)
Summary: [facility]   Imported By: Sherian Rein 04/19/2010 08:14:56  _____________________________________________________________________  External Attachment:    Type:   Image     Comment:   External Document
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
## 2010-10-15 NOTE — Miscellaneous (Signed)
Summary: [facility]  [facility]   Imported By: Sherian Rein 01/02/2010 09:02:27  _____________________________________________________________________  External Attachment:    Type:   Image     Comment:   External Document
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
## 2010-10-15 NOTE — Progress Notes (Signed)
Summary: dexilant  Phone Note Refill Request Message from:  Fax from Pharmacy on Jan 14, 2010 3:37 PM  Refills Requested: Medication #1:  DEXILANT 60 MG CPDR 1 qd  Method Requested: Electronic Initial call taken by: Orlan Leavens,  Jan 14, 2010 3:37 PM    Prescriptions: DEXILANT 60 MG CPDR (DEXLANSOPRAZOLE) 1 qd  #30 x 5   Entered by:   Orlan Leavens   Authorized by:   Minus Breeding MD   Signed by:   Orlan Leavens on 01/14/2010   Method used:   Electronically to        [facility] [address] #[id number]* (retail)       [address] [place], [place]  [address]       Ph: [phone]       Fax: [phone]   RxID:   [id number]
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
## 2010-10-15 NOTE — Progress Notes (Signed)
Summary: [facility]  Phone Note Outgoing Call   Summary of Call: Faxed completed paperwork to [facility] and sent a copy to be scanned.  Initial call taken by: Josph Macho RMA,  December 21, 2009 11:25 AM

## 2010-10-15 NOTE — Progress Notes (Signed)
Summary: [facility]  Phone Note Outgoing Call   Summary of Call: Faxed completed paperwork to [facility] and sent a copyt to be scanned. Initial call taken by: Josph Macho RMA,  Jan 22, 2010 9:14 AM

## 2010-10-15 NOTE — Miscellaneous (Signed)
Summary: Controlled Substances Contract  Controlled Substances Contract   Imported By: Sherian Rein 07/04/2010 14:16:20  _____________________________________________________________________  External Attachment:    Type:   Image     Comment:   External Document

## 2010-10-15 NOTE — Letter (Signed)
Summary: [facility]  [facility]   Imported By: Sherian Rein 10/16/2009 13:52:38  _____________________________________________________________________  External Attachment:    Type:   Image     Comment:   External Document

## 2010-10-15 NOTE — Miscellaneous (Signed)
Summary: OT Eval/[facility]  OT Eval/[place]   Imported By: Sherian Rein 03/22/2010 08:18:22  _____________________________________________________________________  External Attachment:    Type:   Image     Comment:   External Document
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
## 2010-10-15 NOTE — Miscellaneous (Signed)
Summary: [facility]   Imported By: Lester McKittrick 05/21/2010 13:01:28  _____________________________________________________________________  External Attachment:    Type:   Image     Comment:   External Document

## 2010-10-15 NOTE — Progress Notes (Signed)
[facility]  Phone Note Outgoing Call   Summary of Call: Informed Dot Lanes a at [facility] that MD is waiting on MRI report before signing off paperwork. Initial call taken by: Josph Macho RMA,  Jan 21, 2010 1:13 PM     Appended Document: [facility] Contacted Mia at [facility] and she is faxing over the MRI report of right and left shoulder.

## 2010-10-15 NOTE — Miscellaneous (Signed)
Summary: Discharge/[place]  Discharge/[facility]   Imported By: Lester Eupora 04/16/2010 09:13:14  _____________________________________________________________________  External Attachment:    Type:   Image     Comment:   External Document

## 2010-10-15 NOTE — Letter (Signed)
Summary: [facility]  [facility]   Imported By: Sherian Rein 10/16/2009 13:51:18  _____________________________________________________________________  External Attachment:    Type:   Image     Comment:   External Document

## 2010-10-15 NOTE — Letter (Signed)
Summary: [facility]  [facility]   Imported By: Sherian Rein 12/15/2009 08:57:55  _____________________________________________________________________  External Attachment:    Type:   Image     Comment:   External Document

## 2010-10-15 NOTE — Medication Information (Signed)
Summary: Crutches/[facility]  Crutches/[facility]   Imported By: Sherian Rein 01/24/2010 10:04:34  _____________________________________________________________________  External Attachment:    Type:   Image     Comment:   External Document

## 2010-10-15 NOTE — Progress Notes (Signed)
Summary:  [facility]  Phone Note From Other Clinic   Summary of Call: Faxed completed paperwork to [facility] and a copy to be scanned. Initial call taken by: Josph Macho RMA,  Feb 12, 2010 12:52 PM
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
## 2010-10-15 NOTE — Procedures (Signed)
Summary: EGD:    EGD  Procedure date:  08/05/2004  Findings:      Findings: Gastritis  Location: [facility]   Patient Name: [name], [name] MRN:  Procedure Procedures: Panendoscopy (EGD) CPT: 43235.    with biopsy(s)/brushing(s). CPT: [id number].  Personnel: Endoscopist: Iva Boop, MD, [facility].  Referred By: Cleophas Dunker Everardo All, MD.  Exam Location: Exam performed in Outpatient Clinic. Outpatient  Patient Consent: Procedure, Alternatives, Risks and Benefits discussed, consent obtained, from patient. Consent was obtained by the RN.  Indications Symptoms: Chest Pain. Nausea. Reflux symptoms  History  Current Medications: Patient is not currently taking Coumadin.  Pre-Exam Physical: Performed Aug 05, 2004  Cardio-pulmonary exam, HEENT exam, Mental status exam WNL. Abnormal PE findings include: ASA I airway.  Exam Exam Info: Maximum depth of insertion Duodenum, intended Duodenum. Patient position: on left side. Gastric retroflexion performed. Images taken. ASA Classification: III. Tolerance: excellent.  Sedation Meds: Patient assessed and found to be appropriate for moderate (conscious) sedation. Fentanyl 100 mcg. given IV. Versed 8 mg. given IV. Cetacaine Spray 2 sprays given aerosolized.  Monitoring: BP and pulse monitoring done. Oximetry used. Supplemental O2 given  Findings - Normal: Proximal Esophagus to Body.  MUCOSAL ABNORMALITY: Antrum. Erythematous mucosa. Red spots present. Mottled mucosa. Biopsy/Mucosal Abn taken. ICD9: Gastritis, Unspecified: 535.50.  Normal: Duodenal Bulb to Duodenal 2nd Portion.   Assessment Abnormal examination, see findings above.  Diagnoses: 535.50: Gastritis, Unspecified.   Comments: NON-EROSIVE GASTRITIS. HIS SYMPTOMS ARE MUCH IMPROVED. Events  Unplanned Intervention: No unplanned interventions were required.  Plans Comments: TRY TO REDUCE PREVACID TO 1X/DAY. CONROL BLOOD SUGARS. IF WORSE MAY NEED GASTRIC  EMPTYING STUDY Disposition: After procedure patient sent to recovery. After recovery patient sent home.  Scheduling: Colonoscopy, to Iva Boop, MD, Clementeen Graham, NEXT   CC:   [name], MD  This report was created from the original endoscopy report, which was reviewed and signed by the above listed endoscopist.

## 2010-10-15 NOTE — Letter (Signed)
Summary: CMN for Prosthetic/Advanced Prosthetic & Orthotics  CMN for Prosthetic/[facility] Orthotics   Imported By: Lennie Odor 12/27/2009 14:25:10  _____________________________________________________________________  External Attachment:    Type:   Image     Comment:   External Document

## 2010-10-15 NOTE — Assessment & Plan Note (Signed)
Summary: f/u appt/#/cd   Vital Signs:  Patient profile:   60 year old male Height:      72 inches (182.88 cm) Weight:      225 pounds (102.27 kg) O2 Sat:      98 % on Room air Temp:     97.3 degrees F (36.28 degrees C) oral Pulse rate:   82 / minute BP sitting:   158 / 62  (left arm) Cuff size:   large  Vitals Entered By: Josph Macho CMA (October 11, 2009 1:37 PM)  O2 Flow:  Room air CC: Follow-up visit/ pt states he needs RX refills/ CF Is Patient Diabetic? Yes   CC:  Follow-up visit/ pt states he needs RX refills/ CF.  History of Present Illness: dr at [facility] has noticed svere fe-deficiency anemia.  he does not take fe tabs.   no cbg record, but states cbg's are sometimes low in the early hours of the am. he still requires oxy-ir for the pain at his right bka site.  Current Medications (verified): 1)  Albertsons Ec Aspirin 325 Mg  Tbec (Aspirin) .... Take 1 By Mouth Qd 2)  Humalog Mix 50/50 Pen 50-50 %  Susp (Insulin Lispro Prot & Lispro) .... 60 Units Bid 3)  Bd U/f Short Pen Needle 31g X 8 Mm  Misc (Insulin Pen Needle) .... Use As Directed 4)  Accu-Chek Aviva   Strp (Glucose Blood) .... Check Blood Three Times A Day 5)  Metoclopramide Hcl 10 Mg Tabs (Metoclopramide Hcl) .... Take 1 Three Times A Day (Qac) 6)  Nexium 40 Mg Cpdr (Esomeprazole Magnesium) .... Take 2 By Mouth Qd 7)  Zolpidem Tartrate 10 Mg Tabs (Zolpidem Tartrate) .... Take 1 At Bedtime 8)  Oxycodone Hcl 5 Mg Tabs (Oxycodone Hcl) .Marland Kitchen.. 1-3 Tabs Q 3 To 4 Hours As Needed 9)  Sertraline Hcl 100 Mg Tabs (Sertraline Hcl) .Marland Kitchen.. 1 Qd 10)  Trazodone Hcl 100 Mg Tabs (Trazodone Hcl) .... At Bedtime  Allergies (verified): No Known Drug Allergies  Past History:  Past Medical History: Smoker (Quit 1984) Non-Cardiogenic (10/1997) ED Gastritis ANEMIA, IRON DEFICIENCY (ICD-280.9) ACUTE OSTEOMYELITIS, LOWER LEG (ICD-730.06) DEPRESSION (ICD-311) PULMONARY INSUFFICIENCY FOLLOWING TRAUMA&SURGERY  (ICD-518.5) ANTIHYPERLIPIDEMIC USE, LONG TERM (ICD-V58.69) HYPERCHOLESTEROLEMIA (ICD-272.0) PRIMARY HYPERPARATHYROIDISM (ICD-252.01) SPECIAL SCREENING MALIGNANT NEOPLASM OF PROSTATE (ICD-V76.44) ROUTINE GENERAL MEDICAL EXAM@HEALTH  CARE FACL (ICD-V70.0) SOB (ICD-786.05) BACK PAIN, LUMBAR (ICD-724.2) COUGH (ICD-786.2) EDEMA (ICD-782.3) DEGENERATIVE JOINT DISEASE, BACK (ICD-715.98) HYPERTENSION (ICD-401.9) DIABETES MELLITUS, TYPE I (ICD-250.01)  Review of Systems  The patient denies weight loss and weight gain.         denies brbpr/hematuria/bruising  Physical Exam  General:  obese.  no distress.  in wheelchair. Extremities:  has right bka Additional Exam:  outside test results are reviewed:  hemoglobin=7.6 iron saturation=5%   Impression & Recommendations:  Problem # 1:  DIABETES MELLITUS, TYPE I (ICD-250.01) ? overcontrolled  Problem # 2:  ANEMIA, IRON DEFICIENCY (ICD-280.9) needs increased rx  Problem # 3:  ACUTE OSTEOMYELITIS, LOWER LEG (ICD-730.06) s/p dka  Problem # 4:  HYPERTENSION (ICD-401.9) with ? of situational component  Medications Added to Medication List This Visit: 1)  Humalog Mix 50/50 Pen 50-50 % Susp (Insulin lispro prot & lispro) .... 60 units am and 50 units with evening meal  Other Orders: Gastroenterology Referral (GI) Est. Patient Level IV (82956)   Patient Instructions: 1)  take iron 2 tabs 3x a day. 2)  refer gi--prob needs repeat upper and lower gi endoscopic evaluation. 3)  reduce humalog 75/25 to 60 units am and 50 units with evening meal. 4)  we'll follow blood pressure on same medication for now. 5)  physical in 30 days with a1c and microalbumin 250.01, and iron panel 280.9 prior Prescriptions: ZOLPIDEM TARTRATE 10 MG TABS (ZOLPIDEM TARTRATE) take 1 at bedtime  #30 x 5   Entered and Authorized by:   Minus Breeding MD   Signed by:   Minus Breeding MD on 10/11/2009   Method used:   Print then Give to Patient   RxID:    [id number] TRAZODONE HCL 100 MG TABS (TRAZODONE HCL) at bedtime  #30.0 Each x 11   Entered and Authorized by:   Minus Breeding MD   Signed by:   Minus Breeding MD on 10/11/2009   Method used:   Electronically to        [facility] #[id number]* (retail)       [address] [place], [place]  [address]       Ph: [phone]       Fax: [phone]   RxID:   [id number] SERTRALINE HCL 100 MG TABS (SERTRALINE HCL) 1 qd  #30 x 11   Entered and Authorized by:   Minus Breeding MD   Signed by:   Minus Breeding MD on 10/11/2009   Method used:   Electronically to        [facility] #[id number]* (retail)       [address] [place], [place]  [address]       Ph: [phone]       Fax: [phone]   RxID:   [id number] NEXIUM 40 MG CPDR (ESOMEPRAZOLE MAGNESIUM) take 2 by mouth qd  #60 x 11   Entered and Authorized by:   Minus Breeding MD   Signed by:   Minus Breeding MD on 10/11/2009   Method used:   Electronically to        [facility] #[id number]* (retail)       [address] [place], [place]  [address]       Ph: [phone]       Fax: [phone]   RxID:   [id number] METOCLOPRAMIDE HCL 10 MG TABS (METOCLOPRAMIDE HCL) take 1 three times a day (qac)  #90 x 11   Entered and Authorized by:   Minus Breeding MD   Signed by:   Minus Breeding MD on 10/11/2009   Method used:   Electronically to        [facility] #[id number]* (retail)       [address] [place], [place]  [address]       Ph: [phone]       Fax: [phone]   RxID:   [id number] ACCU-CHEK AVIVA   STRP (GLUCOSE BLOOD) CHECK BLOOD three times a day  #100.0 Each x 11   Entered and Authorized by:   Minus Breeding MD   Signed by:   Minus Breeding MD on 10/11/2009   Method used:   Electronically to        [facility] #[id number]* (retail)       [address] [place], [place]  [address]       Ph: [phone]  Fax: [phone]   RxID:    [id number] BD U/F SHORT PEN NEEDLE 31G X 8 MM  MISC (INSULIN PEN NEEDLE) USE AS DIRECTED  #180 x 3   Entered and Authorized by:   Minus Breeding MD   Signed by:   Minus Breeding MD on 10/11/2009   Method used:   Electronically to        [facility] #[id number]* (retail)       [address] [place], [place]  [address]       Ph: [phone]       Fax: [phone]   RxID:   [id number] HUMALOG MIX 50/50 PEN 50-50 %  SUSP (INSULIN LISPRO PROT & LISPRO) 60 units am and 50 units with evening meal  #3 boxes x 11   Entered and Authorized by:   Minus Breeding MD   Signed by:   Minus Breeding MD on 10/11/2009   Method used:   Electronically to        [facility] #[id number]* (retail)       [address] [place], [place]  [address]       Ph: [phone]       Fax: [phone]   RxID:   [id number] OXYCODONE HCL 5 MG TABS (OXYCODONE HCL) 1-3 tabs q 3 to 4 hours as needed  #100 x 0   Entered and Authorized by:   Minus Breeding MD   Signed by:   Minus Breeding MD on 10/11/2009   Method used:   Print then Give to Patient   RxID:   [id number]

## 2010-10-15 NOTE — Procedures (Signed)
Summary: Colonoscopy: Normal   Colonoscopy  Procedure date:  08/05/2004  Findings:      Results: Normal. Location:  [facility].    Procedures Next Due Date:    Colonoscopy: 08/2014  Patient Name: [name], [name] MRN:  Procedure Procedures: Colonoscopy CPT: [phone].  Personnel: Endoscopist: Iva Boop, MD, [facility].  Referred By: Cleophas Dunker Everardo All, MD.  Exam Location: Exam performed in Outpatient Clinic. Outpatient  Patient Consent: Procedure, Alternatives, Risks and Benefits discussed, consent obtained, from patient. Consent was obtained by the RN.  Indications  Average Risk Screening Routine.  History  Current Medications: Patient is not currently taking Coumadin.  Pre-Exam Physical: Performed Aug 05, 2004. Cardio-pulmonary exam, Rectal exam, HEENT exam , Mental status exam WNL. Abnormal PE findings include: ASA I airway.  Exam Exam: Extent of exam reached: Cecum, extent intended: Cecum.  The cecum was identified by appendiceal orifice and IC valve. Patient position: on left side. Colon retroflexion performed. Images taken. ASA Classification: III. Tolerance: excellent.  Monitoring: Pulse and BP monitoring, Oximetry used. Supplemental O2 given.  Colon Prep Used MiraLax for colon prep. Prep results: excellent.  Sedation Meds: Patient assessed and found to be appropriate for moderate (conscious) sedation. Residual sedation present from prior procedure today.  Versed 2 mg. given IV.  Findings - NORMAL EXAM: Cecum to Rectum.   Assessment Normal examination.  Comments: NO POLYPS OR CANCER SEEN  Events  Unplanned Interventions: No intervention was required.  Plans Patient Education: Patient given standard instructions for: a normal exam. Yearly hemoccult testing recommended. START HEMOCCULTS IN 5 YRS.  Disposition: After procedure patient sent to recovery. After recovery patient sent home.  Scheduling/Referral: Colonoscopy, to Iva Boop, MD, Jenene Slicker,  Clinic Visit, to Iva Boop, MD, Eunice Blase NEEDED,  Clinic Visit, to [facility] A. Everardo All, MD, AS PLANNED,   CC:   [name], MD  This report was created from the original endoscopy report, which was reviewed and signed by the above listed endoscopist.

## 2010-10-15 NOTE — Letter (Signed)
Summary: CMN for Prosthetic/[facility]sthetic [facility] Orthotics  CMN for Prosthetic/[facility]sthetic [facility] Orthotics   Imported By: Sherian Rein 10/15/2009 10:27:29  _____________________________________________________________________  External Attachment:    Type:   Image     Comment:   External Document
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
## 2010-10-15 NOTE — Miscellaneous (Signed)
Summary: OT Eval order/[facility]  OT Eval order/[facility]   Imported By: Sherian Rein 02/14/2010 12:10:50  _____________________________________________________________________  External Attachment:    Type:   Image     Comment:   External Document

## 2010-10-15 NOTE — Progress Notes (Signed)
Summary: [facility]  Phone Note Outgoing Call   Summary of Call: Informed pt that he needed to come in for an appt or send records from dr that evaluated. Initial call taken by: Josph Macho RMA,  December 31, 2009 11:00 AM

## 2010-10-15 NOTE — Miscellaneous (Signed)
Summary: [facility]in[facility]  [facility]   Imported By: Lester Section 05/29/2010 11:39:35  _____________________________________________________________________  External Attachment:    Type:   Image     Comment:   External Document

## 2010-10-15 NOTE — Letter (Signed)
Summary: DME/[facility]  DME/[facility]   Imported By: Lester Carlton 01/16/2010 09:13:49  _____________________________________________________________________  External Attachment:    Type:   Image     Comment:   External Document

## 2010-10-15 NOTE — Progress Notes (Signed)
Summary: [employer]: Received paperwork from [facility] representative. Paperwork put on MD's desk. Initial call taken by: Josph Macho CMA,  October 11, 2009 3:52 PM

## 2010-10-17 NOTE — Miscellaneous (Signed)
Summary: Orders Update  Clinical Lists Changes  Problems: Added new problem of SWELLING, LIMB (ICD-729.81) Orders: Added new Test order of Venous Duplex Lower Extremity (Venous Duplex Lower) - Signed 

## 2010-10-17 NOTE — Assessment & Plan Note (Signed)
Summary: 1 MTH FU---STC   Vital Signs:  Patient profile:   60 year old male Height:      72 inches (182.88 cm) Weight:      240 pounds (109.09 kg) BMI:     32.67 O2 Sat:      95 % on Room air Temp:     99.2 degrees F (37.33 degrees C) oral Pulse rate:   81 / minute Pulse rhythm:   regular BP sitting:   126 / 62  (left arm) Cuff size:   large  Vitals Entered By: Brenton Grills CMA Duncan Dull) (October 01, 2010 9:39 AM)  O2 Flow:  Room air CC: 1 month F/U/aj Is Patient Diabetic? Yes   Primary Provider:  Minus Breeding MD  CC:  1 month F/U/aj.  History of Present Illness: pt states 2 weeks of moderate swelling of the left leg, and assoc pain. no cbg record, but states cbg's are 100-180.  it is higher later in the day, than in am. pt says he takes multiviatmin with fe, 2/day.  Current Medications (verified): 1)  Humalog Mix 50/50 Pen 50-50 %  Susp (Insulin Lispro Prot & Lispro) .... 60 Units Each Am 2)  Bd U/f Short Pen Needle 31g X 8 Mm  Misc (Insulin Pen Needle) .... Use As Directed 3)  Metoclopramide Hcl 10 Mg Tabs (Metoclopramide Hcl) .... Take 1 Three Times A Day (Qac) As Needed 4)  Oxycodone Hcl 5 Mg Tabs (Oxycodone Hcl) .Marland Kitchen.. 1-3 Tabs Q 3 To 4 Hours As Needed For Pain 5)  Sertraline Hcl 100 Mg Tabs (Sertraline Hcl) .... Take 1 Tablet By Mouth Once A Day 6)  Trazodone Hcl 100 Mg Tabs (Trazodone Hcl) .... At Bedtime 7)  Promethazine Hcl 25 Mg Tabs (Promethazine Hcl) .... As Needed 8)  Zofran 8 Mg Tabs (Ondansetron Hcl) .... As Needed 9)  Freestyle Lite Test  Strp (Glucose Blood) .... Check Blood Sugar Three Times A Day 10)  Metoprolol Tartrate 25 Mg Tabs (Metoprolol Tartrate) .... 1/2 Tab Two Times A Day 11)  Lyrica 100 Mg Caps (Pregabalin) .Marland Kitchen.. 1 Tab Three Times A Day 12)  Oxycontin 20 Mg Xr12h-Tab (Oxycodone Hcl) .Marland Kitchen.. 1 Tab Two Times A Day 13)  Triamcinolone Acetonide 0.1 % Crea (Triamcinolone Acetonide) .... Three Times A Day As Needed For Itching 14)  Nexium 40 Mg Cpdr  (Esomeprazole Magnesium) .Marland Kitchen.. 1 Capsule By Mouth Once Daily 15)  Advanced Diabetic Multivitamin  Tabs (Multiple Vitamins-Minerals) .Marland Kitchen.. 1 By Mouth Two Times A Day 16)  Levitra 20 Mg Tabs (Vardenafil Hcl) .... For As Needed Use 17)  Doxycycline Hyclate 100 Mg Caps (Doxycycline Hyclate) .Marland Kitchen.. 1 Tab Two Times A Day  Allergies (verified): No Known Drug Allergies  Review of Systems  The patient denies dyspnea on exertion.         denies hypoglycemia  Physical Exam  Pulses:  left dp is absent Extremities:  has right bka.   left leg and foot: no deformity.  no ulcer.  normal color and temp.   2+ left pedal edema.   Neurologic:  sensation is intact to touch on the left foot, but decreased from normal. Additional Exam:  Iron Saturation           27.8 %                      20.0-50.0 Hemoglobin A1C       [H]  8.5 %  B-Type Natriuetic Peptide  47.3 pg/mL                  0.0-100.0 Hemoglobin           [L]  10.4 g/dL                   21.3-08.6   Hematocrit           [L]  31.3 %        Impression & Recommendations:  Problem # 1:  SWELLING, LIMB (ICD-729.81) recurrent  Problem # 2:  ANEMIA, IRON DEFICIENCY (ICD-280.9) fe-deficiency does not completely explain the anemia  Problem # 3:  DIABETES MELLITUS, TYPE I (ICD-250.01) needs increased rx  Medications Added to Medication List This Visit: 1)  Humalog Mix 50/50 Pen 50-50 % Susp (Insulin lispro prot & lispro) .... 65 units each am 2)  Furosemide 20 Mg Tabs (Furosemide) .Marland Kitchen.. 1 tab once daily  Other Orders: [facility] (Vascular) T- * Misc. Laboratory test [phone]) T-2 View CXR (71020TC) TLB-IBC Pnl (Iron/FE;Transferrin) (83550-IBC) TLB-A1C / Hgb A1C (Glycohemoglobin) (83036-A1C) TLB-BMP (Basic Metabolic Panel-BMET) (80048-METABOL) TLB-BNP (B-Natriuretic Peptide) (83880-BNPR) Est. Patient Level IV (96295)  Patient Instructions: 1)  check an ultrasound of the left leg, to check for a blood clot. 2)  blood tests are  being ordered for you today.  please call [phone] to hear your test results. 3)  pending the test results, please increase insulin to 65 units each am. 4)  Please schedule a follow-up appointment in 1 month. 5)  (update: i left message on phone-tree:  rx as we discussed, except add lasix 20/d). Prescriptions: FUROSEMIDE 20 MG TABS (FUROSEMIDE) 1 tab once daily  #30 x 11   Entered and Authorized by:   Minus Breeding MD   Signed by:   Minus Breeding MD on 10/01/2010   Method used:   Electronically to        [facility] [address] [phone]* (retail)       [address]       [place], [place]  [address]       Ph: [phone]       Fax: [phone]   RxID:   [id number] OXYCONTIN 20 MG XR12H-TAB (OXYCODONE HCL) 1 tab two times a day  #60 x 0   Entered and Authorized by:   Minus Breeding MD   Signed by:   Minus Breeding MD on 10/01/2010   Method used:   Print then Give to Patient   RxID:   [id number] OXYCODONE HCL 5 MG TABS (OXYCODONE HCL) 1-3 tabs q 3 to 4 hours as needed for pain  #100 x 0   Entered and Authorized by:   Minus Breeding MD   Signed by:   Minus Breeding MD on 10/01/2010   Method used:   Print then Give to Patient   RxID:   [id number]    Orders Added: 1)  Vascular Clinic [Vascular] 2)  T- * Misc. Laboratory test [99999] 3)  T-2 View CXR [71020TC] 4)  TLB-IBC Pnl (Iron/FE;Transferrin) [83550-IBC] 5)  TLB-A1C / Hgb A1C (Glycohemoglobin) [83036-A1C] 6)  TLB-BMP (Basic Metabolic Panel-BMET) [80048-METABOL] 7)  TLB-BNP (B-Natriuretic Peptide) [83880-BNPR] 8)  Est. Patient Level IV [76160]

## 2010-10-17 NOTE — Progress Notes (Signed)
Summary: Call Report  Phone Note Other Incoming   Caller: [employer] Summary of Call: [facility] Triage Call Report Triage Record Num: [id number] Operator: Aundra Millet Patient Name: [facility] Call Date & Time: 10/01/2010 6:15:02PM Patient Phone: [phone] PCP: [name] Patient Gender: Male PCP Fax : [phone] Patient DOB: May 07, 1951 Practice Name: [facility] Reason for Call: Mary calling from [facility] with STAT CBC with diff lab resuults drawn 10/01/2010 at 342 pm per Dr Everardo All -- RBC: 3.32 HGB: 10.4 (lo) HCT: 31.3 (lo) RDW:16.8 (hi) Gran% :30 (lo) Lymp % : 60 (hi) -- No critical lab results. RN faxed results to office. Protocol(s) Used: Office Note Recommended Outcome per Protocol: Information Noted and Sent to Office Reason for Outcome: Caller information to office Care Advice:  ~ 01/ Initial call taken by: Margaret Pyle, CMA,  October 02, 2010 8:44 AM

## 2010-10-17 NOTE — Assessment & Plan Note (Signed)
Summary: 1 MTH FU  STC   Vital Signs:  Patient profile:   60 year old male Height:      72 inches (182.88 cm) Weight:      237 pounds (107.73 kg) BMI:     32.26 O2 Sat:      97 % on Room air Temp:     98.8 degrees F (37.11 degrees C) oral Pulse rate:   89 / minute BP sitting:   122 / 60  (left arm) Cuff size:   large  Vitals Entered By: Brenton Grills CMA Duncan Dull) (September 02, 2010 9:30 AM)  O2 Flow:  Room air CC: Follow-up visit/refill on Oxycontin/discuss Cialis/pt is no longer taking Metoprolol Tartrate/aj Is Patient Diabetic? Yes   Primary Provider:  Minus Breeding MD  CC:  Follow-up visit/refill on Oxycontin/discuss Cialis/pt is no longer taking Metoprolol Tartrate/aj.  History of Present Illness: the status of at least 3 ongoing medical problems is addressed today: dm: no cbg record, but states cbg's are sometimes low in the middle of the night.  it is highest in the afternoon.  he is taking insulin 30 units am and 20 in the evening. low-back pain: he says his pain control is much better since we increased th oxycontin, but he still requires the oxy-ir, 2-3/day.  ed: pt says cialis does not help.   pt has h/o pustules on the back, and of mrsa infection of the nose.  he now has a pustule on the back.    Current Medications (verified): 1)  Humalog Mix 50/50 Pen 50-50 %  Susp (Insulin Lispro Prot & Lispro) .... 60 Units Each Am 2)  Bd U/f Short Pen Needle 31g X 8 Mm  Misc (Insulin Pen Needle) .... Use As Directed 3)  Metoclopramide Hcl 10 Mg Tabs (Metoclopramide Hcl) .... Take 1 Three Times A Day (Qac) As Needed 4)  Oxycodone Hcl 5 Mg Tabs (Oxycodone Hcl) .Marland Kitchen.. 1-3 Tabs Q 3 To 4 Hours As Needed For Pain 5)  Sertraline Hcl 100 Mg Tabs (Sertraline Hcl) .... Take 1 Tablet By Mouth Once A Day 6)  Trazodone Hcl 100 Mg Tabs (Trazodone Hcl) .... At Bedtime 7)  Cialis 20 Mg Tabs (Tadalafil) .... For As Needed Use 8)  Promethazine Hcl 25 Mg Tabs (Promethazine Hcl) .... As Needed 9)   Zofran 8 Mg Tabs (Ondansetron Hcl) .... As Needed 10)  Freestyle Lite Test  Strp (Glucose Blood) .... Check Blood Sugar Three Times A Day 11)  Metoprolol Tartrate 25 Mg Tabs (Metoprolol Tartrate) .... 1/2 Tab Two Times A Day 12)  Lyrica 100 Mg Caps (Pregabalin) .Marland Kitchen.. 1 Tab Three Times A Day 13)  Oxycontin 20 Mg Xr12h-Tab (Oxycodone Hcl) .Marland Kitchen.. 1 Tab Two Times A Day 14)  Triamcinolone Acetonide 0.1 % Crea (Triamcinolone Acetonide) .... Three Times A Day As Needed For Itching 15)  Nexium 40 Mg Cpdr (Esomeprazole Magnesium) .Marland Kitchen.. 1 Capsule By Mouth Once Daily 16)  Advanced Diabetic Multivitamin  Tabs (Multiple Vitamins-Minerals) .Marland Kitchen.. 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    Physical Exam  General:  no distress  Skin:  4 cm pustule at the right upper back. no drainage.   Impression & Recommendations:  Problem # 1:  DIABETES MELLITUS, TYPE I (ICD-250.01) he needs some adjustment in his therapy  Problem # 2:  BACK PAIN, LUMBAR (ICD-724.2) needs increased rx  Problem # 3:   METHICILLIN RESISTANT STAPHYLOCOCCUS AUREUS INFECTION (ICD-041.12) with recurrence of a pustule  Problem # 4:  ERECTILE DYSFUNCTION, ORGANIC (ICD-607.84) needs increased rx  Medications Added to Medication List This Visit: 1)  Nexium 40 Mg Cpdr (Esomeprazole magnesium) .Marland Kitchen.. 1 capsule by mouth once daily 2)  Advanced Diabetic Multivitamin Tabs (Multiple vitamins-minerals) .Marland Kitchen.. 1 by mouth two times a day 3)  Levitra 20 Mg Tabs (Vardenafil hcl) .... For as needed use 4)  Doxycycline Hyclate 100 Mg Caps (Doxycycline hyclate) .Marland Kitchen.. 1 tab two times a day  Other Orders: Est. Patient Level IV ([address])  Patient Instructions: 1)  change insulin to 60 units am and none in the evening. 2)  same pain medications 3)  try levitra 20 mg as needed ed symptoms. 4)  Please schedule a follow-up appointment in 1 month. 5)  take doxycycline 100 mg two times a day. Prescriptions: DOXYCYCLINE HYCLATE 100 MG CAPS (DOXYCYCLINE HYCLATE) 1 tab two times a day  #14 x 0   Entered and Authorized by:   Minus Breeding MD   Signed by:   Minus Breeding MD on 09/02/2010   Method used:   Electronically to        [facility] #[id number]* (retail)       [address]       [place], [place]  [address]       Ph: [phone]       Fax: [phone]   RxID:   [id number] LEVITRA 20 MG TABS (VARDENAFIL HCL) for as needed use  #6 x 11   Entered and Authorized by:   Minus Breeding MD   Signed by:   Minus Breeding MD on 09/02/2010   Method used:   Print then Give to Patient   RxID:   [id number] OXYCONTIN 20 MG XR12H-TAB (OXYCODONE HCL) 1 tab two times a day  #60 x 0   Entered and Authorized by:   Minus Breeding MD   Signed by:   Minus Breeding MD on 09/02/2010   Method used:   Print then Give to Patient   RxID:   [id number] OXYCODONE HCL 5 MG TABS (OXYCODONE HCL) 1-3 tabs q 3 to 4 hours as needed for pain  #100 x 0   Entered and Authorized by:   Minus Breeding MD    Signed by:   Minus Breeding MD on 09/02/2010   Method used:   Print then Give to Patient   RxID:   [id number]    Orders Added: 1)  Est. Patient Level IV [23557]   Immunization History:  Influenza Immunization History:    Influenza:  historical (06/15/2010)   Immunization History:  Influenza Immunization History:    Influenza:  Historical (06/15/2010)

## 2010-10-18 NOTE — Letter (Signed)
Summary: [facility]  [facility]   Imported By: Sherian Rein 10/16/2009 13:50:24  _____________________________________________________________________  External Attachment:    Type:   Image     Comment:   External Document

## 2010-10-31 ENCOUNTER — Other Ambulatory Visit: Payer: Medicare Other

## 2010-10-31 ENCOUNTER — Encounter (INDEPENDENT_AMBULATORY_CARE_PROVIDER_SITE_OTHER): Payer: Self-pay | Admitting: *Deleted

## 2010-10-31 ENCOUNTER — Encounter: Payer: Self-pay | Admitting: Endocrinology

## 2010-10-31 ENCOUNTER — Ambulatory Visit (INDEPENDENT_AMBULATORY_CARE_PROVIDER_SITE_OTHER): Payer: Medicare Other | Admitting: Endocrinology

## 2010-10-31 ENCOUNTER — Other Ambulatory Visit: Payer: Self-pay | Admitting: Endocrinology

## 2010-10-31 DIAGNOSIS — R609 Edema, unspecified: Secondary | ICD-10-CM

## 2010-10-31 DIAGNOSIS — E109 Type 1 diabetes mellitus without complications: Secondary | ICD-10-CM

## 2010-10-31 LAB — BASIC METABOLIC PANEL
Calcium: 9 mg/dL (ref 8.4–10.5)
Creatinine, Ser: 0.7 mg/dL (ref 0.4–1.5)
GFR: 134.23 mL/min (ref >60.00)

## 2010-11-01 ENCOUNTER — Telehealth: Payer: Self-pay | Admitting: Endocrinology

## 2010-11-01 ENCOUNTER — Encounter (INDEPENDENT_AMBULATORY_CARE_PROVIDER_SITE_OTHER): Payer: Self-pay | Admitting: *Deleted

## 2010-11-06 NOTE — Progress Notes (Signed)
Summary: rx refill req  Phone Note Refill Request Message from:  Fax from Pharmacy on November 01, 2010 1:36 PM  Refills Requested: Medication #1:  NEXIUM 40 MG CPDR 1 capsule by mouth once daily   Dosage confirmed as above?Dosage Confirmed   Last Refilled: 08/12/2010  Method Requested: Electronic Next Appointment Scheduled: 12/05/2010 Initial call taken by: Brenton Grills CMA (AAMA),  November 01, 2010 1:37 PM    Prescriptions: NEXIUM 40 MG CPDR (ESOMEPRAZOLE MAGNESIUM) 1 capsule by mouth once daily  #30 x 5   Entered by:   Brenton Grills CMA (AAMA)   Authorized by:   Minus Breeding MD   Signed by:   Brenton Grills CMA (AAMA) on 11/01/2010   Method used:   Electronically to        [facility] #[id number]* (retail)       [address]       [place], [place]  [address]       Ph: [phone]       Fax: [phone]   RxID:   [id number]

## 2010-11-06 NOTE — Letter (Signed)
Summary: New Patient letter  [facility]  [address] [place], [place] [address]   Phone: [phone]  Fax: [phone]       11/01/2010 MRN: [id number]  [facility] [address] [place], [place]  [address]  [place]  Dear [name],  Welcome to the Gastroenterology Division at [facility].    You are scheduled to see Dr.  Leone Payor on 12-12-10 at 3:30P.M. on the 3rd floor at [facility], [address]  We ask that you try to arrive at our office 15 minutes prior to your appointment time to allow for check-in.  We would like you to complete the enclosed self-administered evaluation form prior to your visit and bring it with you on the day of your appointment.  We will review it with you.  Also, please bring a complete list of all your medications or, if you prefer, bring the medication bottles and we will list them.  Please bring your insurance card so that we may make a copy of it.  If your insurance requires a referral to see a specialist, please bring your referral form from your primary care physician.  Co-payments are due at the time of your visit and may be paid by cash, check or credit card.     Your office visit will consist of a consult with your physician (includes a physical exam), any laboratory testing he/she may order, scheduling of any necessary diagnostic testing (e.g. x-ray, ultrasound, CT-scan), and scheduling of a procedure (e.g. Endoscopy, Colonoscopy) if required.  Please allow enough time on your schedule to allow for any/all of these possibilities.    If you cannot keep your appointment, please call [phone] to cancel or reschedule prior to your appointment date.  This allows [place] the opportunity to schedule an appointment for another patient in need of care.  If you do not cancel or reschedule by 5 p.m. the business day prior to your appointment date, you will be charged a $50.00 late cancellation/no-show fee.    Thank you for  choosing [facility] for your medical needs.  We appreciate the opportunity to care for you.  Please visit [place] at our website  to learn more about our practice.                     Sincerely,                                                             The Gastroenterology Division

## 2010-11-12 NOTE — Assessment & Plan Note (Signed)
Summary: 1 MTH FU STC   Vital Signs:  Patient profile:   60 year old male Height:      72 inches (182.88 cm) Weight:      242 pounds (110.00 kg) BMI:     32.94 O2 Sat:      96 % on Room air Temp:     98.9 degrees F (37.17 degrees C) oral Pulse rate:   76 / minute Pulse rhythm:   regular BP sitting:   128 / 68  (left arm) Cuff size:   large  Vitals Entered By: Brenton Grills CMA Duncan Dull) (October 31, 2010 9:14 AM)  O2 Flow:  Room air CC: 1 month F/U/refills on Oxycodone, Oxycontin and Nexium/pt is no longer taking Metoprolol/aj Is Patient Diabetic? Yes   Primary Provider:  Minus Breeding MD  CC:  1 month F/U/refills on Oxycodone and Oxycontin and Nexium/pt is no longer taking Metoprolol/aj.  History of Present Illness: the status of at least 3 ongoing medical problems is addressed today: edema:  only slightly better on lasix, despite taking it two times a day. dm:  no cbg record, but states cbg's vary from 110 (am) to 200 (afternoon).  no hypoglycemia sxs htn:  he stopped metoprolol, and notices no difference in how he feels. esophagitis:  he has recurrent nausea.    Current Medications (verified): 1)  Humalog Mix 50/50 Pen 50-50 %  Susp (Insulin Lispro Prot & Lispro) .... 65 Units Each Am 2)  Bd U/f Short Pen Needle 31g X 8 Mm  Misc (Insulin Pen Needle) .... Use As Directed 3)  Metoclopramide Hcl 10 Mg Tabs (Metoclopramide Hcl) .... Take 1 Three Times A Day (Qac) As Needed 4)  Oxycodone Hcl 5 Mg Tabs (Oxycodone Hcl) .Marland Kitchen.. 1-3 Tabs Q 3 To 4 Hours As Needed For Pain 5)  Sertraline Hcl 100 Mg Tabs (Sertraline Hcl) .... Take 1 Tablet By Mouth Once A Day 6)  Trazodone Hcl 100 Mg Tabs (Trazodone Hcl) .... At Bedtime 7)  Promethazine Hcl 25 Mg Tabs (Promethazine Hcl) .... As Needed 8)  Zofran 8 Mg Tabs (Ondansetron Hcl) .... As Needed 9)  Freestyle Lite Test  Strp (Glucose Blood) .... Check Blood Sugar Three Times A Day 10)  Metoprolol Tartrate 25 Mg Tabs (Metoprolol Tartrate) ....  1/2 Tab Two Times A Day 11)  Lyrica 100 Mg Caps (Pregabalin) .Marland Kitchen.. 1 Tab Three Times A Day 12)  Oxycontin 20 Mg Xr12h-Tab (Oxycodone Hcl) .Marland Kitchen.. 1 Tab Two Times A Day 13)  Triamcinolone Acetonide 0.1 % Crea (Triamcinolone Acetonide) .... Three Times A Day As Needed For Itching 14)  Nexium 40 Mg Cpdr (Esomeprazole Magnesium) .Marland Kitchen.. 1 Capsule By Mouth Once Daily 15)  Advanced Diabetic Multivitamin  Tabs (Multiple Vitamins-Minerals) .Marland Kitchen.. 1 By Mouth Two Times A Day 16)  Levitra 20 Mg Tabs (Vardenafil Hcl) .... For As Needed Use 17)  Furosemide 20 Mg Tabs (Furosemide) .Marland Kitchen.. 1 Tab Once Daily 18)  Carafate 1 Gm/66ml Susp (Sucralfate) .Marland Kitchen.. 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         no vomiting  Physical Exam  General:  no distress. obese.   Extremities:  2+ left pedal edema.   Additional Exam:  Sodium                    144 mEq/L                   135-145   Potassium                 4.0 mEq/L                   3.5-5.1   Chloride                  109 mEq/L                    96-112   Carbon Dioxide            30 mEq/L                    19-32   Glucose                   83 mg/dL                    14-78   BUN                       11 mg/dL                    2-95   Creatinine                0.7 mg/dL                   6.2-1.3   Calcium                   9.0 mg/dL         Impression & Recommendations:  Problem # 1:  DIABETES MELLITUS, TYPE I (ICD-250.01) he needs slightly increased rx.  Problem # 2:  HYPERTENSION (ICD-401.9) he does not need lopressor now.  Problem # 3:  EDEMA (ICD-782.3) Assessment: Unchanged these labs say pt can increase lasix  Problem # 4:  ESOPHAGITIS (ICD-530.10) he needs f/u here in [place]  Problem # 5:  UNSPECIFIED PERIPHERAL VASCULAR DISEASE (ICD-443.9) needs f/u  Medications Added to Medication List This Visit: 1)  Carafate 1 Gm/69ml Susp (Sucralfate) .Marland Kitchen.. 10ml four times a day as needed 2)  Humalog Mix 50/50 Kwikpen 50-50 % Susp (Insulin lispro prot & lispro) .... 70 units each am 3)  Furosemide 40 Mg Tabs (Furosemide) .Marland Kitchen.. 1 tab once daily  Other Orders: TLB-BMP (Basic Metabolic Panel-BMET) (80048-METABOL) Cardiology Referral (Cardiology) Gastroenterology Referral (GI) Est. Patient Level IV ([address])  Patient Instructions: 1)  blood tests are being ordered for you today.  please call [phone] to hear your test results. 2)  pending the test results, please increase insulin to 70 units each am. 3)  it is ok to stay-off the metoprolol.   4)  refer to an artery specialist.  you will be called with a day and time for an appointment. 5)  refer gastroenterology.  you will be called with a day and  time for an appointment. 6)  Please schedule a follow-up appointment in 1 month. 7)  (update: i left message on phone-tree:  increase lasix to 40 mg once daily) Prescriptions: FUROSEMIDE 40 MG TABS (FUROSEMIDE) 1 tab once daily  #30 x 11   Entered and Authorized by:   Minus Breeding MD   Signed by:   Minus Breeding MD on  11/01/2010   Method used:   Electronically to        [facility] #[id number]* (retail)       [address]       [place], [place]  [address]       Ph: [phone]       Fax: [phone]   RxID:   [id number] CARAFATE 1 GM/10ML SUSP (SUCRALFATE) 10ml four times a day as needed  #30 days x 11   Entered and Authorized by:   Minus Breeding MD   Signed by:   Minus Breeding MD on 10/31/2010   Method used:   Electronically to        [facility] [address] [phone]* (retail)       [address]       [place], [place]  [address]       Ph: [phone]       Fax: [phone]   RxID:   [id number] OXYCONTIN 20 MG XR12H-TAB (OXYCODONE HCL) 1 tab two times a day  #60 x 0   Entered and Authorized by:   Minus Breeding MD   Signed by:   Minus Breeding MD on 10/31/2010   Method used:   Print then Give to Patient   RxID:   [id number] OXYCODONE HCL 5 MG TABS (OXYCODONE HCL) 1-3 tabs q 3 to 4 hours as needed for pain  #100 x 0   Entered and Authorized by:   Minus Breeding MD   Signed by:   Minus Breeding MD on 10/31/2010   Method used:   Print then Give to Patient   RxID:   [id number]    Orders Added: 1)  TLB-BMP (Basic Metabolic Panel-BMET) [80048-METABOL] 2)  Cardiology Referral [Cardiology] 3)  Gastroenterology Referral [GI] 4)  Est. Patient Level IV [82993]

## 2010-11-13 ENCOUNTER — Ambulatory Visit (INDEPENDENT_AMBULATORY_CARE_PROVIDER_SITE_OTHER): Payer: Medicare Other | Admitting: Cardiovascular Disease

## 2010-11-13 ENCOUNTER — Encounter: Payer: Self-pay | Admitting: Cardiovascular Disease

## 2010-11-13 DIAGNOSIS — R609 Edema, unspecified: Secondary | ICD-10-CM

## 2010-11-18 ENCOUNTER — Telehealth: Payer: Self-pay | Admitting: Internal Medicine

## 2010-11-21 ENCOUNTER — Ambulatory Visit (HOSPITAL_COMMUNITY): Payer: Medicare Other | Attending: Endocrinology

## 2010-11-21 DIAGNOSIS — I739 Peripheral vascular disease, unspecified: Secondary | ICD-10-CM | POA: Insufficient documentation

## 2010-11-21 DIAGNOSIS — I10 Essential (primary) hypertension: Secondary | ICD-10-CM | POA: Insufficient documentation

## 2010-11-21 DIAGNOSIS — I517 Cardiomegaly: Secondary | ICD-10-CM

## 2010-11-21 DIAGNOSIS — E119 Type 2 diabetes mellitus without complications: Secondary | ICD-10-CM | POA: Insufficient documentation

## 2010-11-21 DIAGNOSIS — R609 Edema, unspecified: Secondary | ICD-10-CM | POA: Insufficient documentation

## 2010-11-21 DIAGNOSIS — E7800 Pure hypercholesterolemia, unspecified: Secondary | ICD-10-CM | POA: Insufficient documentation
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
## 2010-11-23 ENCOUNTER — Inpatient Hospital Stay (HOSPITAL_COMMUNITY)
Admission: EM | Admit: 2010-11-23 | Discharge: 2010-11-28 | DRG: 565 | Disposition: A | Discharge status: Home Health | Payer: Medicare Other | Attending: Internal Medicine | Admitting: Internal Medicine

## 2010-11-23 ENCOUNTER — Emergency Department (HOSPITAL_COMMUNITY): Payer: Medicare Other

## 2010-11-23 DIAGNOSIS — L02419 Cutaneous abscess of limb, unspecified: Secondary | ICD-10-CM | POA: Diagnosis present

## 2010-11-23 DIAGNOSIS — T8740 Infection of amputation stump, unspecified extremity: Principal | ICD-10-CM | POA: Diagnosis present

## 2010-11-23 DIAGNOSIS — F3289 Other specified depressive episodes: Secondary | ICD-10-CM | POA: Diagnosis present

## 2010-11-23 DIAGNOSIS — Z794 Long term (current) use of insulin: Secondary | ICD-10-CM

## 2010-11-23 DIAGNOSIS — F329 Major depressive disorder, single episode, unspecified: Secondary | ICD-10-CM | POA: Diagnosis present

## 2010-11-23 DIAGNOSIS — Y835 Amputation of limb(s) as the cause of abnormal reaction of the patient, or of later complication, without mention of misadventure at the time of the procedure: Secondary | ICD-10-CM | POA: Diagnosis present

## 2010-11-23 DIAGNOSIS — I10 Essential (primary) hypertension: Secondary | ICD-10-CM | POA: Diagnosis present

## 2010-11-23 DIAGNOSIS — Z79899 Other long term (current) drug therapy: Secondary | ICD-10-CM

## 2010-11-23 DIAGNOSIS — G8929 Other chronic pain: Secondary | ICD-10-CM | POA: Diagnosis present

## 2010-11-23 DIAGNOSIS — Z8614 Personal history of Methicillin resistant Staphylococcus aureus infection: Secondary | ICD-10-CM

## 2010-11-23 DIAGNOSIS — K219 Gastro-esophageal reflux disease without esophagitis: Secondary | ICD-10-CM | POA: Diagnosis present

## 2010-11-23 DIAGNOSIS — Y92009 Unspecified place in unspecified non-institutional (private) residence as the place of occurrence of the external cause: Secondary | ICD-10-CM

## 2010-11-23 DIAGNOSIS — L03119 Cellulitis of unspecified part of limb: Secondary | ICD-10-CM | POA: Diagnosis present

## 2010-11-23 DIAGNOSIS — E1169 Type 2 diabetes mellitus with other specified complication: Secondary | ICD-10-CM | POA: Diagnosis present

## 2010-11-23 LAB — DIFFERENTIAL
Basophils Absolute: 0 10*3/uL (ref 0.0–0.1)
Basophils Relative: 0 % (ref 0–1)
Eosinophils Relative: 2 % (ref 0–5)
Monocytes Absolute: 0.6 10*3/uL (ref 0.1–1.0)
Neutro Abs: 5.5 10*3/uL (ref 1.7–7.7)

## 2010-11-23 LAB — GLUCOSE, CAPILLARY: Glucose-Capillary: 205 mg/dL — ABNORMAL HIGH (ref 70–99)

## 2010-11-23 LAB — COMPREHENSIVE METABOLIC PANEL
AST: 13 U/L (ref 0–37)
Albumin: 3.5 g/dL (ref 3.5–5.2)
Calcium: 9.2 mg/dL (ref 8.4–10.5)
Creatinine, Ser: 0.64 mg/dL (ref 0.4–1.5)
GFR calc Af Amer: >60 mL/min (ref >60)
Total Protein: 6.7 g/dL (ref 6.0–8.3)

## 2010-11-23 LAB — CBC
Hemoglobin: 11 g/dL — ABNORMAL LOW (ref 13.0–17.0)
MCHC: 34.9 g/dL (ref 30.0–36.0)
RDW: 15.4 % (ref 11.5–15.5)

## 2010-11-24 ENCOUNTER — Inpatient Hospital Stay (HOSPITAL_COMMUNITY): Payer: Medicare Other

## 2010-11-24 LAB — GLUCOSE, CAPILLARY
Glucose-Capillary: 236 mg/dL — ABNORMAL HIGH (ref 70–99)
Glucose-Capillary: 181 mg/dL — ABNORMAL HIGH (ref 70–99)

## 2010-11-24 LAB — CBC
Hemoglobin: 10 g/dL — ABNORMAL LOW (ref 13.0–17.0)
MCH: 32.8 pg (ref 26.0–34.0)
MCV: 95.1 fL (ref 78.0–100.0)
RBC: 3.05 MIL/uL — ABNORMAL LOW (ref 4.22–5.81)

## 2010-11-24 LAB — BASIC METABOLIC PANEL
Calcium: 8.5 mg/dL (ref 8.4–10.5)
Creatinine, Ser: 0.64 mg/dL (ref 0.4–1.5)
GFR calc non Af Amer: >60 mL/min (ref >60)
Glucose, Bld: 197 mg/dL — ABNORMAL HIGH (ref 70–99)
Sodium: 137 mEq/L (ref 135–145)

## 2010-11-24 LAB — LIPID PANEL
Cholesterol: 98 mg/dL (ref 0–200)
HDL: 27 mg/dL — ABNORMAL LOW (ref >39)
Total CHOL/HDL Ratio: 3.6 RATIO
Triglycerides: 110 mg/dL (ref <150)

## 2010-11-24 MED ORDER — GADOBENATE DIMEGLUMINE 529 MG/ML IV SOLN
20.0000 mL | Freq: Once | INTRAVENOUS | Status: AC | PRN
  Administered 2010-11-24: 20 mL via INTRAVENOUS

## 2010-11-25 LAB — GLUCOSE, CAPILLARY
Glucose-Capillary: 190 mg/dL — ABNORMAL HIGH (ref 70–99)
Glucose-Capillary: 132 mg/dL — ABNORMAL HIGH (ref 70–99)
Glucose-Capillary: 55 mg/dL — ABNORMAL LOW (ref 70–99)
Glucose-Capillary: 106 mg/dL — ABNORMAL HIGH (ref 70–99)

## 2010-11-25 LAB — WOUND CULTURE

## 2010-11-25 MED ORDER — GADOBENATE DIMEGLUMINE 529 MG/ML IV SOLN: 20.0000 mL | Freq: Once | INTRAVENOUS | Status: DC

## 2010-11-26 LAB — GLUCOSE, CAPILLARY
Glucose-Capillary: 159 mg/dL — ABNORMAL HIGH (ref 70–99)
Glucose-Capillary: 234 mg/dL — ABNORMAL HIGH (ref 70–99)
Glucose-Capillary: 48 mg/dL — ABNORMAL LOW (ref 70–99)

## 2010-11-26 LAB — CBC
MCH: 32.9 pg (ref 26.0–34.0)
Platelets: 335 10*3/uL (ref 150–400)
RBC: 3.1 MIL/uL — ABNORMAL LOW (ref 4.22–5.81)
WBC: 6.6 10*3/uL (ref 4.0–10.5)

## 2010-11-26 LAB — DIFFERENTIAL
Basophils Relative: 1 % (ref 0–1)
Eosinophils Absolute: 0.3 10*3/uL (ref 0.0–0.7)
Neutrophils Relative %: 32 % — ABNORMAL LOW (ref 43–77)
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
## 2010-11-27 LAB — GLUCOSE, CAPILLARY: Glucose-Capillary: 113 mg/dL — ABNORMAL HIGH (ref 70–99)

## 2010-11-28 LAB — GLUCOSE, CAPILLARY: Glucose-Capillary: 160 mg/dL — ABNORMAL HIGH (ref 70–99)

## 2010-11-28 LAB — CBC
HCT: 28.2 % — ABNORMAL LOW (ref 39.0–52.0)
Hemoglobin: 9.4 g/dL — ABNORMAL LOW (ref 13.0–17.0)
MCV: 94.6 fL (ref 78.0–100.0)
RDW: 15.2 % (ref 11.5–15.5)
WBC: 8 10*3/uL (ref 4.0–10.5)

## 2010-11-30 LAB — CULTURE, BLOOD (ROUTINE X 2)
Culture  Setup Time: 201203110023
Culture: NO GROWTH

## 2010-12-05 ENCOUNTER — Ambulatory Visit (INDEPENDENT_AMBULATORY_CARE_PROVIDER_SITE_OTHER): Payer: Medicare Other | Admitting: Endocrinology

## 2010-12-05 ENCOUNTER — Encounter: Payer: Self-pay | Admitting: Endocrinology

## 2010-12-05 VITALS — BP 124/58 | HR 70 | Temp 99.1°F | Ht 72.0 in

## 2010-12-05 DIAGNOSIS — M1990 Unspecified osteoarthritis, unspecified site: Secondary | ICD-10-CM

## 2010-12-05 DIAGNOSIS — D509 Iron deficiency anemia, unspecified: Secondary | ICD-10-CM

## 2010-12-05 DIAGNOSIS — E119 Type 2 diabetes mellitus without complications: Secondary | ICD-10-CM

## 2010-12-05 MED ORDER — OXYCODONE HCL 20 MG PO TB12: 20.0000 mg | ORAL_TABLET | Freq: Two times a day (BID) | ORAL | Status: DC

## 2010-12-05 MED ORDER — OXYCODONE HCL 5 MG PO TABS: ORAL_TABLET | ORAL | Status: DC
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
## 2010-12-05 NOTE — Discharge Summary (Signed)
NAMEJAHZEEL, [name]                ACCOUNT NO.:  [id number]  MEDICAL RECORD NO.:  [id number]           PATIENT TYPE:  I  LOCATION:  5004                         FACILITY:  [facility]  PHYSICIAN:  Marinda Elk, M.D.DATE OF BIRTH:  08-12-1951  DATE OF ADMISSION:  11/23/2010 DATE OF DISCHARGE:  11/28/2010                              DISCHARGE SUMMARY   PRIMARY CARE DOCTOR:  Sean A. Everardo All, MD  DISCHARGE DIAGNOSES: 1. Cellulite of the right below-knee amputation stump with MRI showing     negative for osteomyelitis, gas, or abscess. 2. Hypertension. 3. Diabetes.  DISCHARGE MEDICATIONS:  Please refer to med rec for the list of his medications.  PROCEDURES PERFORMED:  MRI of the legs showed ulceration along the distal stump with extensive surrounding subcutaneous without old abscess, osteomyelitis, diffuse continues femur, and some myositis and fasciitis cannot be excluded.  The lucency is most likely representative of ulcers.  X-ray of right leg shows soft tissue swelling with no evidence of osteomyelitis.  BRIEF ADMITTING H AND P:  This is a 60 year old gentleman with past medical history significant for diabetes and hypertension and also BKA in October 2012 by [facility].  He relates the past week he started noticing some increased redness of his stump with some increasing pain this morning when he woke up.  The lateral stump was black and blue so he decided to come to the ED.  Please refer to dictation from November 23, 2010, for further details.  PHYSICAL EXAMINATION:  VITAL SIGNS:  Heart rate of 78, blood pressure 142/59, respirations 20, he was saturating 97% on room air, temperature 98. GENERAL:  He is awake, alert, and oriented x3 in no acute distress. HEENT:  Normocephalic, atraumatic.  Pupils equally round and reactive to light and accommodation.  He wears corrective lenses. NECK:  Supple.  No JVD.  No lymphadenopathy.  No bruits. CARDIOVASCULAR:  He has a regular rate  and rhythm with a positive S1 and S2.  No murmurs, rubs, or gallops. ABDOMEN:  Positive bowel sounds, nontender, nondistended, soft. EXTREMITIES:  Positive pulses, no clubbing, cyanosis, or edema, except for his right below-the-knee amputation in the lateral aspect is noted to be swelling and erythematous and a blister that is draining bloody fluid. NEUROLOGICAL:  Nonfocal.  LABS ON ADMISSION:  Sodium 136, potassium 4.0, chloride 104, bicarb 24, glucose 287, BUN of 10, creatinine 0.8.  LFTs within normal limits except for alkaline phosphatase of 58.  His lactic acid was 2.1.  His white count was 10.1, hemoglobin of 10.0, platelet count of 301, with an MCV of 95.  His fasting lipid panel was within normal limits except for his HDL, which is 27.  IMAGING:  As above.  BRIEF HOSPITAL COURSE: 1. Cellulitis of right BKA stump.  He was admitted to the floor.     Imaging was done with results above.  No osteomyelitis.  He was     started on clindamycin for anaerobes and MRSA and Bactrim for gram-     negative due to his history of diabetes.  He will continue this for     4  more days.  He remained afebrile in the hospital and his white     count came down. 2. Diabetes type 2.  No changes were made.  Currently well-controlled. 3. Hypertension, currently well-controlled.  No changes were made.  Vitals on day of discharge shows temperature 98, pulse 54, respirations of 16, blood pressure 115/71.  He was saturating 100% on room air.  Labs on day of discharge shows a white count of 8.0, hemoglobin of 9.4, and a platelet count of 375.  His CK is 333.     Marinda Elk, M.D.     AF/MEDQ  D:  11/28/2010  T:  11/29/2010  Job:  [id number]  cc:   Gregary Signs A. Everardo All, MD  Electronically Signed by Lambert Keto M.D. on 12/05/2010 07:59:15 AM

## 2010-12-05 NOTE — Patient Instructions (Addendum)
Increase insulin to 50 units with breakfast, and 20 with the evening meal. Call if the improvement in your stump wound does not continue.    Take iron (non-prescription) 1 pill per day. Please return here in 1 month.

## 2010-12-12 ENCOUNTER — Ambulatory Visit: Payer: 59 | Admitting: Internal Medicine
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
## 2010-12-18 LAB — GLUCOSE, CAPILLARY
Glucose-Capillary: 170 mg/dL — ABNORMAL HIGH (ref 70–99)
Glucose-Capillary: 174 mg/dL — ABNORMAL HIGH (ref 70–99)
Glucose-Capillary: 88 mg/dL (ref 70–99)

## 2010-12-19 LAB — GLUCOSE, CAPILLARY
Glucose-Capillary: 116 mg/dL — ABNORMAL HIGH (ref 70–99)
Glucose-Capillary: 117 mg/dL — ABNORMAL HIGH (ref 70–99)
Glucose-Capillary: 123 mg/dL — ABNORMAL HIGH (ref 70–99)
Glucose-Capillary: 133 mg/dL — ABNORMAL HIGH (ref 70–99)
Glucose-Capillary: 135 mg/dL — ABNORMAL HIGH (ref 70–99)
Glucose-Capillary: 135 mg/dL — ABNORMAL HIGH (ref 70–99)
Glucose-Capillary: 149 mg/dL — ABNORMAL HIGH (ref 70–99)
Glucose-Capillary: 197 mg/dL — ABNORMAL HIGH (ref 70–99)
Glucose-Capillary: 258 mg/dL — ABNORMAL HIGH (ref 70–99)
Glucose-Capillary: 51 mg/dL — ABNORMAL LOW (ref 70–99)
Glucose-Capillary: 56 mg/dL — ABNORMAL LOW (ref 70–99)
Glucose-Capillary: 61 mg/dL — ABNORMAL LOW (ref 70–99)
Glucose-Capillary: 67 mg/dL — ABNORMAL LOW (ref 70–99)
Glucose-Capillary: 80 mg/dL (ref 70–99)
Glucose-Capillary: 81 mg/dL (ref 70–99)
Glucose-Capillary: 91 mg/dL (ref 70–99)
Glucose-Capillary: 96 mg/dL (ref 70–99)
Glucose-Capillary: 98 mg/dL (ref 70–99)

## 2010-12-19 LAB — COMPREHENSIVE METABOLIC PANEL
ALT: 13 U/L (ref 0–53)
AST: 12 U/L (ref 0–37)
Albumin: 2.8 g/dL — ABNORMAL LOW (ref 3.5–5.2)
Alkaline Phosphatase: 94 U/L (ref 39–117)
CO2: 28 mEq/L (ref 19–32)
Chloride: 108 mEq/L (ref 96–112)
Creatinine, Ser: 0.55 mg/dL (ref 0.4–1.5)
GFR calc Af Amer: >60 mL/min (ref >60)
GFR calc non Af Amer: >60 mL/min (ref >60)
Potassium: 4.2 mEq/L (ref 3.5–5.1)
Sodium: 142 mEq/L (ref 135–145)
Total Bilirubin: 0.1 mg/dL — ABNORMAL LOW (ref 0.3–1.2)

## 2010-12-19 LAB — DIFFERENTIAL
Basophils Absolute: 0.1 10*3/uL (ref 0.0–0.1)
Basophils Relative: 1 % (ref 0–1)
Eosinophils Absolute: 0.2 10*3/uL (ref 0.0–0.7)
Eosinophils Relative: 2 % (ref 0–5)
Monocytes Absolute: 0.4 10*3/uL (ref 0.1–1.0)

## 2010-12-19 LAB — CBC
MCV: 90.7 fL (ref 78.0–100.0)
Platelets: 513 10*3/uL — ABNORMAL HIGH (ref 150–400)
RBC: 3.04 MIL/uL — ABNORMAL LOW (ref 4.22–5.81)
WBC: 6.9 10*3/uL (ref 4.0–10.5)

## 2010-12-25 LAB — URINALYSIS, ROUTINE W REFLEX MICROSCOPIC
Ketones, ur: 15 mg/dL — AB
Nitrite: NEGATIVE
Protein, ur: NEGATIVE mg/dL
Urobilinogen, UA: 1 mg/dL (ref 0.0–1.0)

## 2010-12-25 LAB — PREALBUMIN: Prealbumin: 5.6 mg/dL — ABNORMAL LOW (ref 18.0–45.0)

## 2010-12-25 LAB — CBC
HCT: 29.3 % — ABNORMAL LOW (ref 39.0–52.0)
HCT: 26.9 % — ABNORMAL LOW (ref 39.0–52.0)
Hemoglobin: 9.7 g/dL — ABNORMAL LOW (ref 13.0–17.0)
Hemoglobin: 8.6 g/dL — ABNORMAL LOW (ref 13.0–17.0)
Hemoglobin: 9.1 g/dL — ABNORMAL LOW (ref 13.0–17.0)
MCHC: 33.2 g/dL (ref 30.0–36.0)
MCHC: 33.2 g/dL (ref 30.0–36.0)
MCHC: 33.8 g/dL (ref 30.0–36.0)
MCV: 85.1 fL (ref 78.0–100.0)
MCV: 86.6 fL (ref 78.0–100.0)
MCV: 86.6 fL (ref 78.0–100.0)
MCV: 86.7 fL (ref 78.0–100.0)
Platelets: 421 10*3/uL — ABNORMAL HIGH (ref 150–400)
RBC: 3.44 MIL/uL — ABNORMAL LOW (ref 4.22–5.81)
RBC: 3.02 MIL/uL — ABNORMAL LOW (ref 4.22–5.81)
RBC: 3.1 MIL/uL — ABNORMAL LOW (ref 4.22–5.81)
RBC: 3.13 MIL/uL — ABNORMAL LOW (ref 4.22–5.81)
RDW: 16.3 % — ABNORMAL HIGH (ref 11.5–15.5)
WBC: 6.4 10*3/uL (ref 4.0–10.5)
WBC: 6.7 10*3/uL (ref 4.0–10.5)

## 2010-12-25 LAB — COMPREHENSIVE METABOLIC PANEL
ALT: 10 U/L (ref 0–53)
AST: 11 U/L (ref 0–37)
Albumin: 1.8 g/dL — ABNORMAL LOW (ref 3.5–5.2)
CO2: 24 mEq/L (ref 19–32)
Calcium: 8.1 mg/dL — ABNORMAL LOW (ref 8.4–10.5)
Chloride: 103 mEq/L (ref 96–112)
Creatinine, Ser: 0.58 mg/dL (ref 0.4–1.5)
GFR calc Af Amer: >60 mL/min (ref >60)
Sodium: 134 mEq/L — ABNORMAL LOW (ref 135–145)

## 2010-12-25 LAB — BASIC METABOLIC PANEL
BUN: 3 mg/dL — ABNORMAL LOW (ref 6–23)
BUN: 5 mg/dL — ABNORMAL LOW (ref 6–23)
BUN: 5 mg/dL — ABNORMAL LOW (ref 6–23)
CO2: 26 mEq/L (ref 19–32)
CO2: 26 mEq/L (ref 19–32)
CO2: 24 mEq/L (ref 19–32)
CO2: 26 mEq/L (ref 19–32)
Calcium: 9.1 mg/dL (ref 8.4–10.5)
Calcium: 9.5 mg/dL (ref 8.4–10.5)
Calcium: 9.7 mg/dL (ref 8.4–10.5)
Calcium: 9.1 mg/dL (ref 8.4–10.5)
Chloride: 106 mEq/L (ref 96–112)
Chloride: 109 mEq/L (ref 96–112)
Chloride: 108 mEq/L (ref 96–112)
Chloride: 109 mEq/L (ref 96–112)
Creatinine, Ser: 0.53 mg/dL (ref 0.4–1.5)
Creatinine, Ser: 0.58 mg/dL (ref 0.4–1.5)
Creatinine, Ser: 0.61 mg/dL (ref 0.4–1.5)
Creatinine, Ser: 0.45 mg/dL (ref 0.4–1.5)
Creatinine, Ser: 0.47 mg/dL (ref 0.4–1.5)
Creatinine, Ser: 0.66 mg/dL (ref 0.4–1.5)
GFR calc Af Amer: >60 mL/min (ref >60)
GFR calc Af Amer: >60 mL/min (ref >60)
GFR calc Af Amer: >60 mL/min (ref >60)
GFR calc Af Amer: >60 mL/min (ref >60)
GFR calc non Af Amer: >60 mL/min (ref >60)
GFR calc non Af Amer: >60 mL/min (ref >60)
Glucose, Bld: 115 mg/dL — ABNORMAL HIGH (ref 70–99)
Glucose, Bld: 149 mg/dL — ABNORMAL HIGH (ref 70–99)
Glucose, Bld: 161 mg/dL — ABNORMAL HIGH (ref 70–99)
Glucose, Bld: 147 mg/dL — ABNORMAL HIGH (ref 70–99)
Glucose, Bld: 97 mg/dL (ref 70–99)
Potassium: 3.3 mEq/L — ABNORMAL LOW (ref 3.5–5.1)
Sodium: 136 mEq/L (ref 135–145)
Sodium: 141 mEq/L (ref 135–145)
Sodium: 139 mEq/L (ref 135–145)

## 2010-12-25 LAB — VITAMIN B12: Vitamin B-12: 555 pg/mL (ref 211–911)

## 2010-12-25 LAB — PROTIME-INR
INR: 1.2 (ref 0.00–1.49)
Prothrombin Time: 15.7 seconds — ABNORMAL HIGH (ref 11.6–15.2)

## 2010-12-25 LAB — TSH: TSH: 2.263 u[IU]/mL (ref 0.350–4.500)

## 2010-12-26 ENCOUNTER — Emergency Department (HOSPITAL_COMMUNITY)
Admission: EM | Admit: 2010-12-26 | Discharge: 2010-12-26 | Disposition: A | Discharge status: Home / Self Care | Payer: Medicare Other | Attending: Emergency Medicine | Admitting: Emergency Medicine

## 2010-12-26 DIAGNOSIS — F3289 Other specified depressive episodes: Secondary | ICD-10-CM | POA: Insufficient documentation

## 2010-12-26 DIAGNOSIS — Z794 Long term (current) use of insulin: Secondary | ICD-10-CM | POA: Insufficient documentation

## 2010-12-26 DIAGNOSIS — M7989 Other specified soft tissue disorders: Secondary | ICD-10-CM | POA: Insufficient documentation

## 2010-12-26 DIAGNOSIS — F329 Major depressive disorder, single episode, unspecified: Secondary | ICD-10-CM | POA: Insufficient documentation

## 2010-12-26 DIAGNOSIS — L02419 Cutaneous abscess of limb, unspecified: Secondary | ICD-10-CM | POA: Insufficient documentation

## 2010-12-26 DIAGNOSIS — K219 Gastro-esophageal reflux disease without esophagitis: Secondary | ICD-10-CM | POA: Insufficient documentation

## 2010-12-26 DIAGNOSIS — Z79899 Other long term (current) drug therapy: Secondary | ICD-10-CM | POA: Insufficient documentation

## 2010-12-26 DIAGNOSIS — M79609 Pain in unspecified limb: Secondary | ICD-10-CM | POA: Insufficient documentation

## 2010-12-26 DIAGNOSIS — G8929 Other chronic pain: Secondary | ICD-10-CM | POA: Insufficient documentation

## 2010-12-26 DIAGNOSIS — S88119A Complete traumatic amputation at level between knee and ankle, unspecified lower leg, initial encounter: Secondary | ICD-10-CM | POA: Insufficient documentation

## 2010-12-26 DIAGNOSIS — E119 Type 2 diabetes mellitus without complications: Secondary | ICD-10-CM | POA: Insufficient documentation

## 2010-12-29 DIAGNOSIS — L02419 Cutaneous abscess of limb, unspecified: Secondary | ICD-10-CM

## 2010-12-29 DIAGNOSIS — E119 Type 2 diabetes mellitus without complications: Secondary | ICD-10-CM

## 2010-12-29 DIAGNOSIS — F329 Major depressive disorder, single episode, unspecified: Secondary | ICD-10-CM

## 2010-12-29 DIAGNOSIS — K219 Gastro-esophageal reflux disease without esophagitis: Secondary | ICD-10-CM

## 2010-12-29 DIAGNOSIS — L03119 Cellulitis of unspecified part of limb: Secondary | ICD-10-CM

## 2011-01-02 ENCOUNTER — Encounter: Payer: Self-pay | Admitting: Endocrinology

## 2011-01-02 ENCOUNTER — Ambulatory Visit (INDEPENDENT_AMBULATORY_CARE_PROVIDER_SITE_OTHER): Payer: Medicare Other | Admitting: Endocrinology

## 2011-01-02 VITALS — BP 122/62 | HR 67 | Temp 98.7°F | Ht 72.0 in | Wt 247.0 lb

## 2011-01-02 DIAGNOSIS — L97909 Non-pressure chronic ulcer of unspecified part of unspecified lower leg with unspecified severity: Secondary | ICD-10-CM

## 2011-01-02 DIAGNOSIS — E119 Type 2 diabetes mellitus without complications: Secondary | ICD-10-CM

## 2011-01-02 DIAGNOSIS — J069 Acute upper respiratory infection, unspecified: Secondary | ICD-10-CM

## 2011-01-02 MED ORDER — OXYCODONE HCL 5 MG PO TABS: ORAL_TABLET | ORAL | Status: DC

## 2011-01-02 MED ORDER — OXYCODONE HCL 20 MG PO TB12: 20.0000 mg | ORAL_TABLET | Freq: Two times a day (BID) | ORAL | Status: DC
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
## 2011-01-02 NOTE — Patient Instructions (Addendum)
Change insulin to 60 units with breakfast, and 10 with the evening meal. check your blood sugar 2 times a day.  vary the time of day when you check, between before the 3 meals, and at bedtime.  also check if you have symptoms of your blood sugar being too high or too low.  please keep a record of the readings and bring it to your next appointment here.  please call us sooner if you are having low blood sugar episodes. Refer to wound-care.  you will be called with a day and time for an appointment Please make a "medicare wellness" appointment in 2 months. Here are 2 refills on each of your pain medications. Try store-brand claritin-d as needed for congestion.

## 2011-01-10 ENCOUNTER — Encounter (HOSPITAL_BASED_OUTPATIENT_CLINIC_OR_DEPARTMENT_OTHER): Payer: Medicare Other | Attending: General Surgery

## 2011-01-10 DIAGNOSIS — L8990 Pressure ulcer of unspecified site, unspecified stage: Secondary | ICD-10-CM | POA: Insufficient documentation

## 2011-01-10 DIAGNOSIS — E119 Type 2 diabetes mellitus without complications: Secondary | ICD-10-CM | POA: Insufficient documentation

## 2011-01-10 DIAGNOSIS — L89899 Pressure ulcer of other site, unspecified stage: Secondary | ICD-10-CM | POA: Insufficient documentation

## 2011-01-10 DIAGNOSIS — L089 Local infection of the skin and subcutaneous tissue, unspecified: Secondary | ICD-10-CM | POA: Insufficient documentation

## 2011-01-10 DIAGNOSIS — Z79899 Other long term (current) drug therapy: Secondary | ICD-10-CM | POA: Insufficient documentation

## 2011-01-10 DIAGNOSIS — S88119A Complete traumatic amputation at level between knee and ankle, unspecified lower leg, initial encounter: Secondary | ICD-10-CM | POA: Insufficient documentation

## 2011-01-10 DIAGNOSIS — L84 Corns and callosities: Secondary | ICD-10-CM | POA: Insufficient documentation

## 2011-01-13 ENCOUNTER — Other Ambulatory Visit: Payer: Self-pay | Admitting: Endocrinology

## 2011-01-14 NOTE — Telephone Encounter (Signed)
Please refill prn 

## 2011-01-17 ENCOUNTER — Encounter (HOSPITAL_BASED_OUTPATIENT_CLINIC_OR_DEPARTMENT_OTHER): Payer: Medicare Other | Attending: General Surgery

## 2011-01-17 DIAGNOSIS — E119 Type 2 diabetes mellitus without complications: Secondary | ICD-10-CM | POA: Insufficient documentation

## 2011-01-17 DIAGNOSIS — S88119A Complete traumatic amputation at level between knee and ankle, unspecified lower leg, initial encounter: Secondary | ICD-10-CM | POA: Insufficient documentation

## 2011-01-17 DIAGNOSIS — Z79899 Other long term (current) drug therapy: Secondary | ICD-10-CM | POA: Insufficient documentation

## 2011-01-17 DIAGNOSIS — L84 Corns and callosities: Secondary | ICD-10-CM | POA: Insufficient documentation

## 2011-01-17 DIAGNOSIS — L089 Local infection of the skin and subcutaneous tissue, unspecified: Secondary | ICD-10-CM | POA: Insufficient documentation

## 2011-01-17 DIAGNOSIS — L8990 Pressure ulcer of unspecified site, unspecified stage: Secondary | ICD-10-CM | POA: Insufficient documentation

## 2011-01-17 DIAGNOSIS — L89899 Pressure ulcer of other site, unspecified stage: Secondary | ICD-10-CM | POA: Insufficient documentation

## 2011-01-28 NOTE — Procedures (Signed)
[name], [name]                ACCOUNT NO.:  [id number]   MEDICAL RECORD NO.:  [id number]          PATIENT TYPE:  REC   LOCATION:  TPC                          FACILITY:  [facility]   PHYSICIAN:  Erick Colace, M.D.DATE OF BIRTH:  [birth year]   DATE OF PROCEDURE:  04/10/2008  DATE OF DISCHARGE:                               OPERATIVE REPORT   PROCEDURE:  Bilateral L5 dorsal ramus injections, bilateral L4 medial  branch block, and bilateral L3 medial branch block under fluoroscopic  guidance.   INDICATION:  Lumbar axial pain with facet arthropathy noted on imaging  studies, pain with extension, relieved by flexion.   Informed consent obtained after describing risks and benefits of the  procedure with the patient.  These include bleeding, bruising, and  infection.  He elects to proceed and has given written consent.  The  patient placed prone on fluoroscopy table.  Betadine prep and sterile  drape.  A 25-gauge 1.5-inch needle was used to anesthetize the skin and  subcutaneous tissue with 1% lidocaine x2 mL and a 22-gauge 5-inch spinal  needle was inserted under fluoroscopic guidance first targeting left S1,  SAP, sacral ala junction.  Bone contact made and confirmed with lateral  imaging.  Omnipaque 180 x0.3 mL demonstrated no intravascular uptake.  Then, 0.5 mL of solution containing 2% MPF lidocaine was injected.  Then, the left L5, SAP, transverse process junction targeted.  Bone  contact made and confirmed with lateral imaging.  Omnipaque 180 x0.3 mL  under live fluoro demonstrated no intravascular uptake, followed by  injection 0.5 mL of the lidocaine 2% solution.  Then, the left L4, SAP,  transverse process junction targeted.  Bone contact made and confirmed  with lateral imaging.  Omnipaque 180 x0.5 mL demonstrated no  intravascular uptake, then 0.5 mL of the lidocaine 2% MPF solution was  injected.  This same procedure was repeated on the right side at the  same  corresponding levels using same needle, injectate, and technique.  The patient tolerated the procedure well.  Pre- and post-injection  vitals were stable.  Pre-injection pain level 8/10, post-injection pain  level 5/10.  We will give a pain diary and repeat medial branches based  on the response.      Erick Colace, M.D.  Electronically Signed     AEK/MEDQ  D:  04/10/2008 10:15:10  T:  04/11/2008 00:54:47  Job:  [id number]

## 2011-01-28 NOTE — Group Therapy Note (Signed)
CONSULT REQUESTED BY:  Evalee Mutton, MD   REASON FOR CONSULTATION:  Consult requested for the evaluation of low  back pain.   CHIEF COMPLAINT:  Low back, mid back, and hip pain, which is relatively  constant as well as more intermittent lower extremity pain, which occurs  with ambulation or standing for greater than 10-15 minutes.   ONSET OF COMPLAINT:  He has had worsening of symptoms over the last  year.  He has had a long history of low back pain, and in fact, has had  spine surgery on two occasions, but they were 12 and 15 years ago  respectively.  I do not have any records in regards to his previous back  surgeries, but he states that they were on the lower 3 levels of the  spine, and he thought that the L3-L4, L4-L5, and L5-S1 levels sounded  familiar.   He states that he was out at a flea market yesterday, has had 7 times in  a 2-hour visit.   Up until about a year ago, he was walking 2-3 miles per day.   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  He needs some assistance with certain activities such as dressing,  bathing, household duties, and shopping at times.  His average pain is  rated in the 8-9 range, described as sharp, stabbing, constant,  tingling, and aching, and interferes with activity at an 8-10/10 range.  He does not have much nighttime pain, really more pain during the  morning through evening hours.  Pain is worse with walking, bending, and  standing; improved with medications; relief from meds, however, is just  a 3/10.  He can climb steps.  He can drive.   REVIEW OF SYSTEMS:  Positive for numbness and tingling in his feet.  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  No  neuro studies done.  Other review of systems is positive for trouble  walking, spasms, and weakness.  He also has some night sweats and blood  sugar control problems and limb swelling.   He has had an MRI, which I was able to  review and discuss with the  patient, performed on Jan 31, 2008, lumbar spine with and without  contrast.  No significant finding at L1-L2 and at L2-L3.  At L3-L4,  moderate multifactorial spinal and lateral recess stenosis due to disk  protrusion, short pedicles, and facet disease.  Mild foraminal  encouragement at L5-S1 annular tear, central disk protrusion, diffuse  bulging annulus, short pedicles, and facet disease creating moderately  severe spinal and lateral recess stenosis, but mild foraminal stenosis,  and L5-S1 broad-based disk protrusion, short pedicles and facet disease  with moderate severe spinal and lateral  recess stenosis and mild  foraminal disease.  Also noted was probable transitional lumbar anatomy  with partial lumbarization of S1 and a small disk base at S1-S2.   SOCIAL HISTORY:  Married.  Lives with his wife.  Still working 40 hours  a week, but at an easier job, where he can sit down at times.  Other  treatments tried.  No PT.  No epidurals.   FAMILY HISTORY:  Diabetes and hypertension.   PHYSICAL EXAMINATION:  GENERAL:  Obese male.  Stated weight is 345, this  is up from 280 three years ago.  VITAL SIGNS:  His blood pressure is 156/60, pulse 98, respirations 20,  and O2 sat 97% on room air.  In no acute distress.  Orientation x3.  Affect is anxious.  Gait is with a limp when he first gets up.  EXTREMITIES:  He has 1+ edema bilateral ankle and feet.  Coordination is  normal.  Deep tendon reflexes are 1+ bilateral upper and lower  extremities.  Sensation is reduced in just bilateral S1 dermatomes.  He  has mildly reduced internal and external rotation and abduction of his  shoulders, otherwise full range of motion in the upper extremities,  normal stability.  In his hips, he had decreased internal and external  rotation, normal knee and ankle range of motion instability.  He has  normal muscle tone.  No evidence of tremor.  His motor strength is 5/5  in the  bilateral deltoid, biceps, triceps, grip, hip flexion, knee  extension, and ankle dorsiflexion.  Neck range of motion is full.  Spine  range of motion is about 25% forward flexion, extension, lateral  rotation, and bending.   IMPRESSION:  1. Axial spine pain.  Given it is worse with standing and relieved by      sitting, I suspect lumbar facet disease due to spondylosis as noted      in MRI.  2. Neurogenic claudication/lumbar spinal stenosis, multifactorial.  3. 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  4. Obesity, compounding above problems.   RECOMMENDATIONS:  1. I discussed multipronged approach including lumbar medial branch      blocks, which we could do without steroids as well as combined with      physical therapy with flexion-biased program to decrease the      neurogenic claudication symptoms.  2. Medication management.  We will increase temporarily his Celebrex      to b.i.d.  Add on some Tramadol 50 t.i.d.   We will check urine drug screen, should we elect to go with some  narcotic analgesics, although and less likely given his working  schedule.   Thank you for this interesting consultation.  I have given him some  educational materials in regards to his back.  Should his back injection  do not prove to be very helpful, we may check some hip films, and  consider the potential pain generator as well.      Erick Colace, M.D.  Electronically Signed     AEK/MedQ  D:  03/27/2008 10:33:08  T:  03/28/2008 05:08:41  Job #:  [id number]   cc:   Evalee Mutton, MD

## 2011-01-31 NOTE — Discharge Summary (Signed)
[name], [name]                          ACCOUNT NO.:  [id number]   MEDICAL RECORD NO.:  [id number]                   PATIENT TYPE:  INP   LOCATION:  6705                                 FACILITY:  [facility]   PHYSICIAN:  Asencion Partridge, M.D.                  DATE OF BIRTH:  04-18-1951   DATE OF ADMISSION:  04/12/2003  DATE OF DISCHARGE:  04/14/2003                                 DISCHARGE SUMMARY   DISCHARGE DIAGNOSES:  1. Cellulitis of the left upper extremity.  2. Diabetes mellitus, poorly controlled.  3. Hypertension.  4. Elevated cholesterol.   DISCHARGE MEDICATIONS:  1. Humalog insulin 50 units b.i.d.  2. Accupril 40 mg daily.  3. Lasix 80 mg p.o. daily.  4. Glucophage 1000 mg p.o. daily.  5. Actos 15 mg p.o. daily.  6. Zocor 20 mg p.o. daily.  7. Ibuprofen 800 mg p.o. q.6h. p.r.n. pain.  8. Keflex 500 mg one tablet p.o. q.i.d. x12 days.   DISPOSITION AND FOLLOW UP:  [name] is advised to return to [facility], and also to make an appointment with his primary care  doctor, Dr. Everardo All, as soon as he possibly can.   CONSULTATIONS:  1. Nutrition.  2. Diabetes coordinator.   Upon admission, this 60 year old man with left hand and arm pain, presents  after going to [facility].  He hit his hand on an ice machine  approximately five days prior to admission and developed a small wound on  the dorsum of his hand.  He applied over-the-counter antibacterial cream,  but the day prior to admission he noticed more swelling, redness, and pain,  and developed a fever and chills.  He presented to urgent care who handles  his Workman's Comp.  He was given Rocephin 1 gm IM and transferred for IV  antibiotics to [facility].  He denies nausea, vomiting, chest pain,  shortness of breath, and chills.  He states his wrist pain is a 7/10.  He  has a history of cellulitis 2-3 years previous.  Upon examination, it is  noticed that he has severe tenderness in  his left wrist, limited range of  motion, swelling and erythema, a small healing eschar, and streaking redness  up to his left arm.  No axillary lymphadenopathy is noted.   HOSPITAL COURSE:  #1.  For cellulitis, he was started on IV Ancef.  This was  continued until he was afebrile for 24 hours.  Upon discharge, he was  switched to p.o. antibiotics.  The streaking up his arm and edema and  redness resolved.  His tenderness did not completely go away, but did  improve.  #2.  For diabetes mellitus, he was started on a different regimen of Lantus.  He was continued on his home regimen of Actos.  It was suggested by the  diabetes coordinator that she would try  50 units of Lantus with 10-15 units  of NovoLog three times daily, and a sliding scale to determine how much  insulin would be needed.  Would also continue his Glucophage at 500 mg  b.i.d. and Actos at the home dose of 15 mg.  It is suggested that his  primary care doctor follow up on the situation.  A hemoglobin A1C was  checked during his admission, and was found to be 13.  His diabetes is  clearly poorly controlled upon admission.  #3.  Hypertension.  He was continued on the same medications of Accupril and  Lasix.  #4.  For elevated cholesterol, he was continued on his home medication of  Zocor.   LABORATORIES UPON ADMISSION:  CBC with white count of 10.1, hemoglobin 14.2,  hematocrit 41.4, platelets 310.  Chem-7 revealed sodium of 137, potassium  4.1, chloride 104, bicarb 26, glucose 246, BUN 13, creatinine 0.8, calcium  9.1.  Upon discharge, CBC showed a white count of 8.7, hemoglobin 14.1,  hematocrit 41.2, and platelet count of 300.  He had a normal differential at  that time.  His glucose did remain elevated during his hospitalization.   DISCHARGE INSTRUCTIONS:  It is suggested that he return to [facility] if swelling, redness, streaking, or fever develop, and he should follow  up with [facility] and his  own primary care doctor as soon as  possible.      Ursula Beath, MD                     Asencion Partridge, M.D.    JT/MEDQ  D:  04/14/2003  T:  04/15/2003  Job:  [id number]   cc:   [facility]   Harrel Lemon. Merla Riches, M.D.  [address]  [place]  [place] [address]  Fax: [phone]   Gregary Signs A. Everardo All, M.D. [facility]

## 2011-01-31 NOTE — Op Note (Signed)
[name], [name]                ACCOUNT NO.:  [id number]   MEDICAL RECORD NO.:  [id number]          PATIENT TYPE:  AMB   LOCATION:  DSC                          FACILITY:  [facility]   PHYSICIAN:  Loreta Ave, M.D. DATE OF BIRTH:  01-Mar-1951   DATE OF PROCEDURE:  05/14/2006  DATE OF DISCHARGE:                                 OPERATIVE REPORT   PREOPERATIVE DIAGNOSIS:  Triggering A1 pulley, right hand, ring finger.   POSTOPERATIVE DIAGNOSIS:  Triggering A1 pulley, right hand, ring finger.   PROCEDURES:  Release A1 pulley, ring finger, right hand.   SURGEON:  Loreta Ave, M.D.   ASSISTANT:  Genene Churn. Denton Meek.   ANESTHESIA:  General.   ESTIMATED BLOOD LOSS:  Minimal.   TOURNIQUET TIME:  30 minutes.   SPECIMENS:  None.   CULTURES:  None.   COMPLICATIONS:  None.   DRESSING:  Self-compressive.   PROCEDURE IN DETAIL:  The patient was brought to the operating room and  after adequate anesthesia had been obtained, tourniquet applied to wrist,  right arm.  Prepped and draped in the usual sterile fashion.  A small  transverse incision over the A1 pulley, ring finger.  Skin and subcutaneous  tissue divided.  Neurovascular bundles identified, protected, retracted.  A1  pulley released in its entirety under direct visualization. Moderate fluid  in the sheath drained.  Some thickened tenosynovial tissue drained.  Some  attrition on the tendons, but they were functionally intact.  All triggering  obliterated.  Wound irrigated.  Closed with nylon.  Sterile compressive  dressing applied.  Anesthesia reversed after tourniquet deflated.  Patient  was brought to the recovery room and tolerated the surgery well.  No  complications.     Loreta Ave, M.D.  Electronically Signed    DFM/MEDQ  D:  05/14/2006  T:  05/14/2006  Job:  [id number]

## 2011-01-31 NOTE — Op Note (Signed)
[name], [name]                ACCOUNT NO.:  [id number]   MEDICAL RECORD NO.:  [id number]          PATIENT TYPE:  AMB   LOCATION:  DSC                          FACILITY:  [facility]   PHYSICIAN:  Lucky Cowboy, MD         DATE OF BIRTH:  1951/08/23   DATE OF PROCEDURE:  05/08/2005  DATE OF DISCHARGE:                                 OPERATIVE REPORT   PREOPERATIVE DIAGNOSIS:  Left tongue base mass with left neck mass.   POSTOPERATIVE DIAGNOSIS:  Left tonsil mass.   PROCEDURE:  Direct laryngoscopy with tongue base biopsy, esophagoscopy, left  tonsillectomy.   SURGEON:  Lucky Cowboy, MD   ANESTHESIA:  General endotracheal anesthesia.   ESTIMATED BLOOD LOSS:  20 mL.   SPECIMENS:  Left tonsil.   COMPLICATIONS:  None.   INDICATIONS:  This patient is a 60 year old male who is having a 2-week  history of severe left ear and throat pain.  He was noted to have fullness  in the left tongue base with enhancement and a left neck node on CT scan.  For these reasons, he is brought to the operating room for this procedure.   FINDINGS:  The patient was noted to have a normal laryngeal exam with the  exception of considerable endolaryngeal edema.  His esophageal exam was  normal with exception of findings of her refluxate at the lower esophagus.  The patient was noted to have erythema and a firmness within the left tonsil  that did not appear normal.  For this reason, the left tonsil was excised.  When this occurred, the left neck mass was no longer palpable.  For this  reason, the left fine-needle aspiration was not performed.   PROCEDURE:  The patient was taken to the operating room and placed on the  table in the supine position.  He was then placed under general endotracheal  anesthesia and the table rotated counterclockwise 90 degrees.  Intraoral  examination  by digital palpation was then performed.  There were no  distinct abnormalities of the tongue base.  Direct laryngoscopy was  performed using a Dedo laryngoscope.  The piriform sinuses, vallecula,  endolarynx and postcricoid areas were all examined.  The findings are as  noted above.  A Jesberg long esophagoscope was used to visualize the  postcricoid area while advancing it, visualizing the lumen at all times,  down into the stomach.  There were no abnormalities found, with the  exception of the findings as noted above.  The esophagoscope was then  removed.  A Crowe-Davis mouth gag with a #4 tongue blade was then placed  intraorally, opened and suspended on the Mayo stand.  Examination revealed  abnormality of the left tonsil, which was then removed using the Harmonic  scalpel.  The left palatine tonsil was grasped with an Allis clamp  and directed inferomedially.  The Harmonic scalpel was then used to excise  the tonsil, staying within the peritonsillar space adjacent to the tonsillar  capsule.  The tonsil was excised, staying within the peritonsillar space  adjacent to the tonsillar capsule.  Suction cautery was used for hemostasis.  An NG tube was placed down the  esophagus for suctioning of the gastric contents.  Palpation of the neck was  then performed.  There were no findings of a neck node.  The table was  rotated clockwise 90 degrees to its original position and the patient  awakened from anesthesia.  He was taken to the postanesthesia care unit in  stable condition.  There were no complications.      Lucky Cowboy, MD  Electronically Signed     SJ/MEDQ  D:  05/08/2005  T:  05/09/2005  Job:  [id number]   cc:   Brett Canales A. Cleta Alberts, M.D.  [address]  [place]  [place] [address]  Fax: [phone]   Gregary Signs A. Everardo All, M.D. [facility]  [address]  [place]  [place] [address]   [facility]

## 2011-01-31 NOTE — Op Note (Signed)
[name], [name]                ACCOUNT NO.:  [id number]   MEDICAL RECORD NO.:  [id number]          PATIENT TYPE:  AMB   LOCATION:  DSC                          FACILITY:  [facility]   PHYSICIAN:  Lucky Cowboy, MD         DATE OF BIRTH:  March 29, 1951   DATE OF PROCEDURE:  05/08/2005  DATE OF DISCHARGE:  05/08/2005                                 OPERATIVE REPORT   PREOPERATIVE DIAGNOSIS:  Left-sided tongue base and neck mass.   POSTOPERATIVE DIAGNOSIS:  Left-sided tongue base and neck mass with a left-  sided tonsil mass.   PROCEDURE:  Direct laryngoscopy with left-sided tongue base biopsy, left-  sided tonsillectomy, rigid esophagoscopy.   SURGEON:  Lucky Cowboy, M.D.   ANESTHESIA:  General endotracheal anesthesia.   ESTIMATED BLOOD LOSS:  Less than 20 mL.   SPECIMENS:  Left tongue base biopsy, left tonsil.   COMPLICATIONS:  None.   INDICATIONS:  The patient is a 60 year old male with a 1-week history of  severe left neck and ear pain. Pain medicine is not helping. He underwent a  CT scan of the neck with contrast. He has lost 7 pounds due to dysphasia.  Examination revealed a left upper neck mass. There was also a fullness in  the left tongue base. The CT scan did reveal also a left neck mass and  enhancement of the left tongue base. For these reasons, the above procedures  are performed.   FINDINGS:  The patient was noted to have erythema and fullness of the left  tonsil. For this reason, tonsillectomy was performed. There is diffuse  glottic edema and erythema without ulceration or mass. The esophagus was  without abnormality. There was tongue base fullness without discrete mass.  The tongue base fullness was on the left side.   PROCEDURE:  The patient was taken to the operating room and placed on the  table in the supine position. He was then placed under general endotracheal  anesthesia and table rotated counterclockwise 90 degrees. The head and body  were draped.  Moistened gauze was placed over the upper gingiva. A region #10  Jasper esophagoscope was placed in the postcricoid area and advanced to the  level of the stomach while visualizing the lumen at all times. It was then  retracted visualizing the lumen 360 degrees at all times as well. There were  no abnormalities noted. Attention was then turned to direct laryngoscopy.  The endolarynx was visualized looking at both pyriform sinuses, vallecula,  the endolarynx and postcricoid areas. The findings were as noted above. A  large cup forceps was then used to biopsy the depths of the left tongue  base. An incision was made and then the cup forceps used to enter the  mucosa. Once this was performed, the Crowe-Davis mouth gag with a #4 tongue  blade was placed intraorally, opened and suspended on a Mayo stand.  Inspection and palpation of the oral cavity was performed with the  abnormality of the left tonsil being noted. The left tonsil was grasped with  Allis clamps and directed  inferior medially. The Harmonic scalpel was then  used to excise the tonsil staying within the peritonsillar space adjacent to  the tonsillar capsule. Small amount of bleeding was controlled with suction  cautery. At this point, the mouth gag  was removed after suctioning out the stomach with an NG tube. The palpation  of the neck did not reveal a neck mass. The table was then rotated clockwise  90 degrees. The patient was awakened from anesthesia and taken to the Post  Anesthesia Care Unit in stable condition. No complications.      Lucky Cowboy, MD  Electronically Signed     SJ/MEDQ  D:  05/14/2005  T:  05/15/2005  Job:  [id number]   cc:   [facility]   Brett Canales A. Cleta Alberts, M.D.  [address]  [place]  [place] [address]  Fax: [phone]   Gregary Signs A. Everardo All, M.D. [facility]  [address]  [place]  [place] [address]

## 2011-02-08 ENCOUNTER — Other Ambulatory Visit: Payer: Self-pay | Admitting: Endocrinology

## 2011-02-14 ENCOUNTER — Encounter (HOSPITAL_BASED_OUTPATIENT_CLINIC_OR_DEPARTMENT_OTHER): Payer: Medicare Other | Attending: General Surgery

## 2011-02-14 DIAGNOSIS — Z794 Long term (current) use of insulin: Secondary | ICD-10-CM | POA: Insufficient documentation

## 2011-02-14 DIAGNOSIS — T8740 Infection of amputation stump, unspecified extremity: Secondary | ICD-10-CM | POA: Insufficient documentation

## 2011-02-14 DIAGNOSIS — Y835 Amputation of limb(s) as the cause of abnormal reaction of the patient, or of later complication, without mention of misadventure at the time of the procedure: Secondary | ICD-10-CM | POA: Insufficient documentation

## 2011-02-14 DIAGNOSIS — Z79899 Other long term (current) drug therapy: Secondary | ICD-10-CM | POA: Insufficient documentation

## 2011-02-14 DIAGNOSIS — L97509 Non-pressure chronic ulcer of other part of unspecified foot with unspecified severity: Secondary | ICD-10-CM | POA: Insufficient documentation

## 2011-02-14 DIAGNOSIS — E1169 Type 2 diabetes mellitus with other specified complication: Secondary | ICD-10-CM | POA: Insufficient documentation

## 2011-02-14 DIAGNOSIS — A4902 Methicillin resistant Staphylococcus aureus infection, unspecified site: Secondary | ICD-10-CM | POA: Insufficient documentation

## 2011-02-18 ENCOUNTER — Other Ambulatory Visit: Payer: Self-pay | Admitting: Endocrinology

## 2011-02-19 NOTE — Telephone Encounter (Signed)
Rx faxed to pharmacy  

## 2011-02-21 ENCOUNTER — Other Ambulatory Visit (HOSPITAL_BASED_OUTPATIENT_CLINIC_OR_DEPARTMENT_OTHER): Payer: Self-pay | Admitting: General Surgery

## 2011-02-21 ENCOUNTER — Ambulatory Visit (HOSPITAL_COMMUNITY)
Admission: RE | Admit: 2011-02-21 | Discharge: 2011-02-21 | Disposition: A | Discharge status: Home / Self Care | Payer: Medicare Other | Source: Ambulatory Visit | Attending: General Surgery | Admitting: General Surgery

## 2011-02-21 DIAGNOSIS — T8189XA Other complications of procedures, not elsewhere classified, initial encounter: Secondary | ICD-10-CM

## 2011-02-21 DIAGNOSIS — M79609 Pain in unspecified limb: Secondary | ICD-10-CM | POA: Insufficient documentation

## 2011-02-21 DIAGNOSIS — M949 Disorder of cartilage, unspecified: Secondary | ICD-10-CM | POA: Insufficient documentation

## 2011-02-21 DIAGNOSIS — M1710 Unilateral primary osteoarthritis, unspecified knee: Secondary | ICD-10-CM | POA: Insufficient documentation

## 2011-02-21 DIAGNOSIS — M25569 Pain in unspecified knee: Secondary | ICD-10-CM

## 2011-02-21 DIAGNOSIS — M899 Disorder of bone, unspecified: Secondary | ICD-10-CM | POA: Insufficient documentation

## 2011-02-21 DIAGNOSIS — Y835 Amputation of limb(s) as the cause of abnormal reaction of the patient, or of later complication, without mention of misadventure at the time of the procedure: Secondary | ICD-10-CM | POA: Insufficient documentation

## 2011-02-21 DIAGNOSIS — T8789 Other complications of amputation stump: Secondary | ICD-10-CM | POA: Insufficient documentation

## 2011-02-26 ENCOUNTER — Other Ambulatory Visit: Payer: Self-pay | Admitting: Endocrinology

## 2011-02-26 NOTE — Telephone Encounter (Signed)
Rx faxed to [facility]. 

## 2011-02-26 NOTE — Telephone Encounter (Signed)
SAE pt-please advise 

## 2011-03-03 ENCOUNTER — Other Ambulatory Visit (INDEPENDENT_AMBULATORY_CARE_PROVIDER_SITE_OTHER): Payer: Medicare Other

## 2011-03-03 ENCOUNTER — Encounter: Payer: Self-pay | Admitting: Endocrinology

## 2011-03-03 ENCOUNTER — Ambulatory Visit (INDEPENDENT_AMBULATORY_CARE_PROVIDER_SITE_OTHER): Payer: Medicare Other | Admitting: Endocrinology

## 2011-03-03 VITALS — BP 142/72 | HR 78 | Temp 99.9°F | Ht 72.0 in | Wt 246.0 lb

## 2011-03-03 DIAGNOSIS — F3289 Other specified depressive episodes: Secondary | ICD-10-CM

## 2011-03-03 DIAGNOSIS — D509 Iron deficiency anemia, unspecified: Secondary | ICD-10-CM

## 2011-03-03 DIAGNOSIS — F329 Major depressive disorder, single episode, unspecified: Secondary | ICD-10-CM

## 2011-03-03 DIAGNOSIS — I10 Essential (primary) hypertension: Secondary | ICD-10-CM

## 2011-03-03 DIAGNOSIS — Z0000 Encounter for general adult medical examination without abnormal findings: Secondary | ICD-10-CM

## 2011-03-03 DIAGNOSIS — E109 Type 1 diabetes mellitus without complications: Secondary | ICD-10-CM

## 2011-03-03 DIAGNOSIS — E7800 Pure hypercholesterolemia, unspecified: Secondary | ICD-10-CM

## 2011-03-03 DIAGNOSIS — E210 Primary hyperparathyroidism: Secondary | ICD-10-CM

## 2011-03-03 DIAGNOSIS — Z125 Encounter for screening for malignant neoplasm of prostate: Secondary | ICD-10-CM

## 2011-03-03 DIAGNOSIS — Z79899 Other long term (current) drug therapy: Secondary | ICD-10-CM

## 2011-03-03 LAB — URINALYSIS, ROUTINE W REFLEX MICROSCOPIC
Ketones, ur: NEGATIVE
Leukocytes, UA: NEGATIVE
Nitrite: NEGATIVE
Specific Gravity, Urine: 1.025 (ref 1.000–1.030)
Urobilinogen, UA: 0.2 (ref 0.0–1.0)

## 2011-03-03 LAB — CBC WITH DIFFERENTIAL/PLATELET
Basophils Absolute: 0 10*3/uL (ref 0.0–0.1)
Eosinophils Absolute: 0.1 10*3/uL (ref 0.0–0.7)
Hemoglobin: 12.1 g/dL — ABNORMAL LOW (ref 13.0–17.0)
Lymphocytes Relative: 47.5 % — ABNORMAL HIGH (ref 12.0–46.0)
MCHC: 34.6 g/dL (ref 30.0–36.0)
Neutro Abs: 2.8 10*3/uL (ref 1.4–7.7)
Platelets: 284 10*3/uL (ref 150.0–400.0)
RDW: 16 % — ABNORMAL HIGH (ref 11.5–14.6)

## 2011-03-03 LAB — BASIC METABOLIC PANEL
Chloride: 104 mEq/L (ref 96–112)
Potassium: 3.8 mEq/L (ref 3.5–5.1)

## 2011-03-03 LAB — HEPATIC FUNCTION PANEL
ALT: 14 U/L (ref 0–53)
AST: 17 U/L (ref 0–37)
Bilirubin, Direct: 0.1 mg/dL (ref 0.0–0.3)
Total Bilirubin: 0.6 mg/dL (ref 0.3–1.2)

## 2011-03-03 LAB — LIPID PANEL
Cholesterol: 135 mg/dL (ref 0–200)
LDL Cholesterol: 78 mg/dL (ref 0–99)
Total CHOL/HDL Ratio: 4

## 2011-03-03 LAB — MICROALBUMIN / CREATININE URINE RATIO
Creatinine,U: 107.1 mg/dL
Microalb Creat Ratio: 1.3 mg/g (ref 0.0–30.0)

## 2011-03-03 LAB — IBC PANEL
Iron: 113 ug/dL (ref 42–165)
Saturation Ratios: 27 % (ref 20.0–50.0)

## 2011-03-03 MED ORDER — OXYCODONE HCL 20 MG PO TB12: 20.0000 mg | ORAL_TABLET | Freq: Two times a day (BID) | ORAL | Status: DC

## 2011-03-03 MED ORDER — OXYCODONE HCL 5 MG PO TABS: ORAL_TABLET | ORAL | Status: DC
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
## 2011-03-03 NOTE — Patient Instructions (Addendum)
blood tests are being ordered for you today.  please call [phone] to hear your test results.  You will be prompted to enter the 9-digit "MRN" number that appears at the top left of this page, followed by #.  Then you will hear the message. check your blood sugar 2 times a day.  vary the time of day when you check, between before the 3 meals, and at bedtime.  also check if you have symptoms of your blood sugar being too high or too low.  please keep a record of the readings and bring it to your next appointment here.  please call us sooner if you are having low blood sugar episodes. good diet and exercise habits significanly improve the control of your diabetes.  please let me know if you wish to be referred to a dietician.  high blood sugar is very risky to your health.  you should see an eye doctor every year. controlling your blood pressure and cholesterol drastically reduces the damage diabetes does to your body.  this also applies to quitting smoking.  please discuss these with your doctor.  you should take an aspirin every day, unless you have been advised by a doctor not to. please consider these measures for your health:  minimize alcohol.  do not use tobacco products.  have a colonoscopy at least every 10 years from age 31.  keep firearms safely stored.  always use seat belts.  have working smoke alarms in your home.  see an eye doctor and dentist regularly.  never drive under the influence of alcohol or drugs (including prescription drugs).  those with fair skin should take precautions against the sun. please let me know what your wishes would be, if artificial life support measures should become necessary.  it is critically important to prevent falling down (keep floor areas well-lit, dry, and free of loose objects). Please make a follow-up appointment in 3 months. You should have a vaccine against shingles (a painful rash which results from the  chickenpox infection which most people had many  years ago).  This vaccine reduces, but does not totally eliminate the risk of shingles.  Because this is a medicare part d benefit, there are 3 ways yo can get it:  You can go to a pharmacy and get the injection (I can give you a prescription), or I can give you a prescription to have filled at a pharmacy, and bring back here for [place] to give.  The other option is that you can pay up-front for it, and we'll give you a form to make a claim for reimbursement from your medicare part d carrier. (we discussed code status.  pt requests full code, but would not want to be started or maintained on artificial life-support measures if there was not a reasonable chance of recovery) (update: i left message on phone-tree:  Increase insulin to 70 units am and 20 units pm.  Ret 6 weeks.)

## 2011-03-12 ENCOUNTER — Other Ambulatory Visit: Payer: Self-pay | Admitting: Endocrinology

## 2011-03-17 ENCOUNTER — Encounter (HOSPITAL_BASED_OUTPATIENT_CLINIC_OR_DEPARTMENT_OTHER): Payer: Medicare Other | Attending: General Surgery

## 2011-03-17 DIAGNOSIS — T8740 Infection of amputation stump, unspecified extremity: Secondary | ICD-10-CM | POA: Insufficient documentation

## 2011-03-17 DIAGNOSIS — Z79899 Other long term (current) drug therapy: Secondary | ICD-10-CM | POA: Insufficient documentation

## 2011-03-17 DIAGNOSIS — L97509 Non-pressure chronic ulcer of other part of unspecified foot with unspecified severity: Secondary | ICD-10-CM | POA: Insufficient documentation

## 2011-03-17 DIAGNOSIS — A4902 Methicillin resistant Staphylococcus aureus infection, unspecified site: Secondary | ICD-10-CM | POA: Insufficient documentation

## 2011-03-17 DIAGNOSIS — E1169 Type 2 diabetes mellitus with other specified complication: Secondary | ICD-10-CM | POA: Insufficient documentation

## 2011-03-17 DIAGNOSIS — Z794 Long term (current) use of insulin: Secondary | ICD-10-CM | POA: Insufficient documentation

## 2011-03-17 DIAGNOSIS — Y835 Amputation of limb(s) as the cause of abnormal reaction of the patient, or of later complication, without mention of misadventure at the time of the procedure: Secondary | ICD-10-CM | POA: Insufficient documentation

## 2011-03-24 ENCOUNTER — Other Ambulatory Visit: Payer: Self-pay

## 2011-03-24 MED ORDER — INSULIN LISPRO PROT & LISPRO (50-50 MIX) 100 UNIT/ML ~~LOC~~ SUSP: SUBCUTANEOUS | Status: DC

## 2011-03-26 ENCOUNTER — Other Ambulatory Visit: Payer: Self-pay | Admitting: *Deleted

## 2011-03-26 MED ORDER — INSULIN LISPRO PROT & LISPRO (50-50 MIX) 100 UNIT/ML ~~LOC~~ SUSP: SUBCUTANEOUS | Status: DC

## 2011-03-26 NOTE — Telephone Encounter (Signed)
[facility] sent a fax regarding pt's insulin. Pt is requesting Humalog pens instead of vials.

## 2011-05-04 ENCOUNTER — Other Ambulatory Visit: Payer: Self-pay | Admitting: Endocrinology

## 2011-05-20 ENCOUNTER — Encounter: Payer: Self-pay | Admitting: Endocrinology

## 2011-05-20 ENCOUNTER — Ambulatory Visit (INDEPENDENT_AMBULATORY_CARE_PROVIDER_SITE_OTHER): Payer: Medicare Other | Admitting: Endocrinology

## 2011-05-20 VITALS — BP 142/72 | HR 101 | Temp 98.2°F | Ht 72.0 in | Wt 257.1 lb

## 2011-05-20 DIAGNOSIS — E109 Type 1 diabetes mellitus without complications: Secondary | ICD-10-CM

## 2011-05-20 MED ORDER — OXYCODONE HCL 20 MG PO TB12: 20.0000 mg | ORAL_TABLET | Freq: Two times a day (BID) | ORAL | Status: DC

## 2011-05-20 MED ORDER — OXYCODONE HCL 5 MG PO TABS: ORAL_TABLET | ORAL | Status: DC

## 2011-05-20 MED ORDER — LORAZEPAM 0.5 MG PO TABS: 0.5000 mg | ORAL_TABLET | Freq: Every evening | ORAL | Status: DC | PRN
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
## 2011-05-20 NOTE — Patient Instructions (Addendum)
blood tests are being ordered for you today.  please call [phone] to hear your test results.  You will be prompted to enter the 9-digit "MRN" number that appears at the top left of this page, followed by #.  Then you will hear the message. check your blood sugar 2 times a day.  vary the time of day when you check, between before the 3 meals, and at bedtime.  also check if you have symptoms of your blood sugar being too high or too low.  please keep a record of the readings and bring it to your next appointment here.  please call us sooner if you are having low blood sugar episodes. Increase insulin to 70 units with breakfast,  and 30 units with the evening meal. Please make a follow-up appointment in 3 months.

## 2011-05-21 ENCOUNTER — Telehealth: Payer: Self-pay | Admitting: *Deleted

## 2011-05-21 NOTE — Telephone Encounter (Signed)
Per SAE,pt needs to come in for Hemoglobin A1c (Test was not done with most recent labs at [facility]). Left message for pt to come in to have A1c drawn and to callback office with any questions/concerns.

## 2011-06-02 ENCOUNTER — Ambulatory Visit: Payer: Medicare Other | Admitting: Endocrinology

## 2011-06-13 ENCOUNTER — Other Ambulatory Visit: Payer: Self-pay

## 2011-06-13 MED ORDER — INSULIN PEN NEEDLE 31G X 8 MM MISC: 1.0000 | Freq: Two times a day (BID) | Status: DC | PRN

## 2011-06-13 MED ORDER — GLUCOSE BLOOD VI STRP: ORAL_STRIP | Status: DC

## 2011-06-16 ENCOUNTER — Telehealth: Payer: Self-pay | Admitting: *Deleted

## 2011-06-16 NOTE — Telephone Encounter (Signed)
Immunization Report-[facility]  Vaccine Administered: Fluvirin Multidose vial Dose: 0.5 mL Injection Route/Site: Left arm IM Manufacturer: [employer] Date Administered: 06/13/2011 Lot #: [id number] Exp Date: 03/14/2012

## 2011-06-30 ENCOUNTER — Other Ambulatory Visit: Payer: Self-pay | Admitting: Endocrinology
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
## 2011-07-15 ENCOUNTER — Other Ambulatory Visit: Payer: Self-pay | Admitting: Endocrinology

## 2011-07-28 ENCOUNTER — Other Ambulatory Visit: Payer: Self-pay | Admitting: Endocrinology

## 2011-08-18 ENCOUNTER — Telehealth: Payer: Self-pay

## 2011-08-18 ENCOUNTER — Ambulatory Visit (INDEPENDENT_AMBULATORY_CARE_PROVIDER_SITE_OTHER): Payer: Medicare Other | Admitting: Endocrinology

## 2011-08-18 ENCOUNTER — Other Ambulatory Visit (INDEPENDENT_AMBULATORY_CARE_PROVIDER_SITE_OTHER): Payer: Medicare Other

## 2011-08-18 ENCOUNTER — Encounter: Payer: Self-pay | Admitting: Endocrinology

## 2011-08-18 VITALS — BP 128/62 | HR 82 | Temp 98.9°F | Ht 74.0 in | Wt 249.0 lb

## 2011-08-18 DIAGNOSIS — G894 Chronic pain syndrome: Secondary | ICD-10-CM

## 2011-08-18 DIAGNOSIS — E109 Type 1 diabetes mellitus without complications: Secondary | ICD-10-CM

## 2011-08-18 DIAGNOSIS — G4700 Insomnia, unspecified: Secondary | ICD-10-CM

## 2011-08-18 MED ORDER — OXYCODONE HCL 20 MG PO TB12: 20.0000 mg | ORAL_TABLET | Freq: Two times a day (BID) | ORAL | Status: DC

## 2011-08-18 MED ORDER — OXYCODONE HCL 5 MG PO TABS: ORAL_TABLET | ORAL | Status: DC

## 2011-08-18 MED ORDER — ZOLPIDEM TARTRATE 10 MG PO TABS: 10.0000 mg | ORAL_TABLET | Freq: Every evening | ORAL | Status: DC | PRN

## 2011-08-18 NOTE — Telephone Encounter (Signed)
Pt is requesting Rx for Zostavax be sent to his pharmacy.
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
## 2011-08-18 NOTE — Patient Instructions (Addendum)
blood tests are being ordered for you today.  please call [phone] to hear your test results.  You will be prompted to enter the 9-digit "MRN" number that appears at the top left of this page, followed by #.  Then you will hear the message. check your blood sugar 2 times a day.  vary the time of day when you check, between before the 3 meals, and at bedtime.  also check if you have symptoms of your blood sugar being too high or too low.  please keep a record of the readings and bring it to your next appointment here.  please call us sooner if you are having low blood sugar episodes. change insulin to 80 units with breakfast, and 20 units with the evening meal. Please make a follow-up appointment in 3 months.  Here is a prescription for "zolpidem" (sleep medication).

## 2011-08-19 MED ORDER — ZOSTER VACCINE LIVE 19400 UNT/0.65ML ~~LOC~~ SOLR: 0.6500 mL | Freq: Once | SUBCUTANEOUS | Status: AC

## 2011-08-19 NOTE — Telephone Encounter (Signed)
sent 

## 2011-08-25 ENCOUNTER — Other Ambulatory Visit: Payer: Self-pay | Admitting: Endocrinology

## 2011-09-14 ENCOUNTER — Other Ambulatory Visit: Payer: Self-pay | Admitting: Endocrinology

## 2011-10-23 ENCOUNTER — Other Ambulatory Visit: Payer: Self-pay | Admitting: Endocrinology

## 2011-10-24 DIAGNOSIS — T8789 Other complications of amputation stump: Secondary | ICD-10-CM | POA: Diagnosis not present

## 2011-10-28 ENCOUNTER — Other Ambulatory Visit: Payer: Self-pay | Admitting: Endocrinology

## 2011-10-28 NOTE — Telephone Encounter (Signed)
Rx faxed to [facility]. 

## 2011-11-10 ENCOUNTER — Ambulatory Visit: Payer: Medicare Other

## 2011-11-10 DIAGNOSIS — J029 Acute pharyngitis, unspecified: Secondary | ICD-10-CM | POA: Diagnosis not present

## 2011-11-10 DIAGNOSIS — J329 Chronic sinusitis, unspecified: Secondary | ICD-10-CM | POA: Diagnosis not present

## 2011-11-17 ENCOUNTER — Ambulatory Visit: Payer: 59 | Admitting: Endocrinology

## 2011-11-19 DIAGNOSIS — R05 Cough: Secondary | ICD-10-CM | POA: Diagnosis not present

## 2011-11-19 DIAGNOSIS — J9819 Other pulmonary collapse: Secondary | ICD-10-CM | POA: Diagnosis not present

## 2011-11-19 DIAGNOSIS — J189 Pneumonia, unspecified organism: Secondary | ICD-10-CM | POA: Diagnosis not present

## 2011-11-19 DIAGNOSIS — R918 Other nonspecific abnormal finding of lung field: Secondary | ICD-10-CM | POA: Diagnosis not present

## 2011-11-20 ENCOUNTER — Ambulatory Visit (INDEPENDENT_AMBULATORY_CARE_PROVIDER_SITE_OTHER): Payer: Medicare Other | Admitting: Endocrinology

## 2011-11-20 ENCOUNTER — Other Ambulatory Visit (INDEPENDENT_AMBULATORY_CARE_PROVIDER_SITE_OTHER): Payer: Medicare Other

## 2011-11-20 ENCOUNTER — Encounter: Payer: Self-pay | Admitting: Endocrinology

## 2011-11-20 DIAGNOSIS — E109 Type 1 diabetes mellitus without complications: Secondary | ICD-10-CM | POA: Diagnosis not present

## 2011-11-20 DIAGNOSIS — R071 Chest pain on breathing: Secondary | ICD-10-CM

## 2011-11-20 DIAGNOSIS — R918 Other nonspecific abnormal finding of lung field: Secondary | ICD-10-CM | POA: Diagnosis not present

## 2011-11-20 DIAGNOSIS — R0789 Other chest pain: Secondary | ICD-10-CM

## 2011-11-20 LAB — BASIC METABOLIC PANEL
BUN: 13 mg/dL (ref 6–23)
Chloride: 99 mEq/L (ref 96–112)
Creatinine, Ser: 0.9 mg/dL (ref 0.4–1.5)
GFR: 98.13 mL/min (ref >60.00)
Potassium: 3.9 mEq/L (ref 3.5–5.1)

## 2011-11-20 LAB — HEMOGLOBIN A1C: Hgb A1c MFr Bld: 9.8 % — ABNORMAL HIGH (ref 4.6–6.5)

## 2011-11-20 MED ORDER — OXYCODONE HCL 5 MG PO TABS: ORAL_TABLET | ORAL | Status: DC

## 2011-11-20 MED ORDER — OXYCODONE HCL 20 MG PO TB12: 20.0000 mg | ORAL_TABLET | Freq: Two times a day (BID) | ORAL | Status: DC

## 2011-11-20 NOTE — Patient Instructions (Addendum)
blood tests are being requested for you today.  please call [phone] to hear your test results.  You will be prompted to enter the 9-digit "MRN" number that appears at the top left of this page, followed by #.  Then you will hear the message. Let's check a ct scan.  you will receive a phone call, about a day and time for an appointment.  Then please call [phone] to hear your test results.  You will be prompted to enter the 9-digit "MRN" number that appears at the top left of this page, followed by #.  Then you will hear the message. Please come back for a "medicare wellness" appointment in 4 months. Please finish the antibiotics. Here are 2 months of both pain medications (update: i left message on phone-tree:  What time of day is cbg highest? Please call back)
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
## 2011-11-21 LAB — D-DIMER, QUANTITATIVE: D-Dimer, Quant: 0.22 ug/mL-FEU (ref 0.00–0.48)

## 2011-11-25 ENCOUNTER — Ambulatory Visit (INDEPENDENT_AMBULATORY_CARE_PROVIDER_SITE_OTHER)
Admission: RE | Admit: 2011-11-25 | Discharge: 2011-11-25 | Disposition: A | Discharge status: Home / Self Care | Payer: Medicare Other | Source: Ambulatory Visit | Attending: Endocrinology | Admitting: Endocrinology

## 2011-11-25 ENCOUNTER — Other Ambulatory Visit: Payer: Self-pay | Admitting: Endocrinology

## 2011-11-25 DIAGNOSIS — R918 Other nonspecific abnormal finding of lung field: Secondary | ICD-10-CM | POA: Diagnosis not present

## 2011-11-25 DIAGNOSIS — I2510 Atherosclerotic heart disease of native coronary artery without angina pectoris: Secondary | ICD-10-CM | POA: Insufficient documentation

## 2011-11-25 DIAGNOSIS — J9819 Other pulmonary collapse: Secondary | ICD-10-CM | POA: Diagnosis not present

## 2011-11-25 MED ORDER — IOHEXOL 300 MG/ML  SOLN
80.0000 mL | Freq: Once | INTRAMUSCULAR | Status: AC | PRN
  Administered 2011-11-25: 80 mL via INTRAVENOUS

## 2011-11-26 ENCOUNTER — Ambulatory Visit: Payer: 59 | Admitting: Endocrinology

## 2011-12-03 ENCOUNTER — Ambulatory Visit (INDEPENDENT_AMBULATORY_CARE_PROVIDER_SITE_OTHER): Payer: Medicare Other | Admitting: Cardiovascular Disease

## 2011-12-03 ENCOUNTER — Encounter: Payer: Self-pay | Admitting: Cardiovascular Disease

## 2011-12-03 VITALS — BP 132/70 | HR 70 | Ht 74.0 in | Wt 267.0 lb

## 2011-12-03 DIAGNOSIS — I10 Essential (primary) hypertension: Secondary | ICD-10-CM | POA: Diagnosis not present

## 2011-12-03 DIAGNOSIS — I2510 Atherosclerotic heart disease of native coronary artery without angina pectoris: Secondary | ICD-10-CM

## 2011-12-03 MED ORDER — PRAVASTATIN SODIUM 40 MG PO TABS: 40.0000 mg | ORAL_TABLET | Freq: Every day | ORAL | Status: DC
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
## 2011-12-03 NOTE — Patient Instructions (Signed)
Your physician has recommended you make the following change in your medication: START Aspirin 81mg  take one by mouth daily, START Pravastatin 40mg  take one by mouth daily  Your physician has requested that you have a lexiscan myoview. For further information please visit [email or web address] Please follow instruction sheet, as given.  Your physician recommends that you return for a FASTING LIPID and LIVER Profile in 3 MONTHS--nothing to eat or drink after midnight, lab opens at 8:30  Your physician wants you to follow-up in: 1 YEAR.  You will receive a reminder letter in the mail two months in advance. If you don't receive a letter, please call our office to schedule the follow-up appointment.

## 2011-12-03 NOTE — Assessment & Plan Note (Signed)
Controlled on current medical therapy. 

## 2011-12-03 NOTE — Assessment & Plan Note (Signed)
CAD incidentally diagnosed on chest CT. The patient does not have clear symptoms of angina. Since he has multivessel coronary calcification I recommended a pharmacologic stress Myoview to rule out significant ischemia. I've asked him to start aspirin 81 mg daily and to start pravastatin 40 mg daily. He'll have followup lipids and LFTs in 3 months. Further workup pending the results of the stress test.

## 2011-12-08 NOTE — Progress Notes (Signed)
Addended by: Judithe Modest D on: 12/08/2011 04:43 PM   Modules accepted: Orders

## 2011-12-10 ENCOUNTER — Ambulatory Visit (HOSPITAL_COMMUNITY): Payer: Medicare Other | Attending: Cardiology | Admitting: Radiology

## 2011-12-10 VITALS — BP 129/58 | Ht 74.0 in | Wt 244.0 lb

## 2011-12-10 DIAGNOSIS — R0602 Shortness of breath: Secondary | ICD-10-CM | POA: Insufficient documentation

## 2011-12-10 DIAGNOSIS — R079 Chest pain, unspecified: Secondary | ICD-10-CM | POA: Diagnosis not present

## 2011-12-10 DIAGNOSIS — I739 Peripheral vascular disease, unspecified: Secondary | ICD-10-CM | POA: Insufficient documentation

## 2011-12-10 DIAGNOSIS — E119 Type 2 diabetes mellitus without complications: Secondary | ICD-10-CM | POA: Insufficient documentation

## 2011-12-10 DIAGNOSIS — I2510 Atherosclerotic heart disease of native coronary artery without angina pectoris: Secondary | ICD-10-CM | POA: Insufficient documentation

## 2011-12-10 DIAGNOSIS — Z794 Long term (current) use of insulin: Secondary | ICD-10-CM | POA: Insufficient documentation

## 2011-12-10 DIAGNOSIS — R0609 Other forms of dyspnea: Secondary | ICD-10-CM | POA: Diagnosis not present

## 2011-12-10 DIAGNOSIS — F17200 Nicotine dependence, unspecified, uncomplicated: Secondary | ICD-10-CM | POA: Insufficient documentation

## 2011-12-10 DIAGNOSIS — I10 Essential (primary) hypertension: Secondary | ICD-10-CM | POA: Insufficient documentation

## 2011-12-10 DIAGNOSIS — R0789 Other chest pain: Secondary | ICD-10-CM | POA: Insufficient documentation

## 2011-12-10 DIAGNOSIS — E785 Hyperlipidemia, unspecified: Secondary | ICD-10-CM | POA: Insufficient documentation

## 2011-12-10 DIAGNOSIS — R0989 Other specified symptoms and signs involving the circulatory and respiratory systems: Secondary | ICD-10-CM | POA: Insufficient documentation

## 2011-12-10 MED ORDER — TECHNETIUM TC 99M TETROFOSMIN IV KIT
30.0000 | PACK | Freq: Once | INTRAVENOUS | Status: AC | PRN
  Administered 2011-12-10: 30 via INTRAVENOUS

## 2011-12-10 MED ORDER — TECHNETIUM TC 99M TETROFOSMIN IV KIT
10.0000 | PACK | Freq: Once | INTRAVENOUS | Status: AC | PRN
  Administered 2011-12-10: 10 via INTRAVENOUS

## 2011-12-10 MED ORDER — REGADENOSON 0.4 MG/5ML IV SOLN
0.4000 mg | Freq: Once | INTRAVENOUS | Status: AC
  Administered 2011-12-10: 0.4 mg via INTRAVENOUS

## 2011-12-10 NOTE — Progress Notes (Signed)
[facility] SITE 3 NUCLEAR MED [address] [place] [address] [phone]  Cardiology Nuclear Med Study  [name] is a 61 y.o. male     MRN : [id number]     DOB: February 19, 1951  Procedure Date: 12/10/2011  Nuclear Med Background Indication for Stress Test:  Evaluation for Ischemia and Abnormal Chest CT History: 11/25/11 CT: AS plaque LM LAD and RCA Cardiac Risk Factors: Hypertension, IDDM Type 1, Lipids, PVD and Smoker  Symptoms:  Chest Pressure, DOE and SOB  clear Nuclear Pre-Procedure Caffeine/Decaff Intake:  None NPO After: 7:00pm   Lungs:  clear O2 Sat: 94% on room air. IV 0.9% NS with Angio Cath:  20g  IV Site: R Wrist  IV Started by:  Bonnita Levan, RN  Chest Size (in):  50 Cup Size: n/a  Height: 6\' 2"  (1.88 m)  Weight:  244 lb (110.678 kg)  BMI:  Body mass index is 31.33 kg/(m^2). Tech Comments:  Patient held Insulin this AM, BS= 111 @ 730 AM    Nuclear Med Study 1 or 2 day study: 1 day  Stress Test Type:  Lexiscan  Reading MD: Marca Ancona, MD  Order Authorizing Provider:  Tonny Bollman, MD  Resting Radionuclide: Technetium 62m Tetrofosmin  Resting Radionuclide Dose: 11.0 mCi   Stress Radionuclide:  Technetium 66m Tetrofosmin  Stress Radionuclide Dose: 32.9 mCi           Stress Protocol Rest HR: 64 Stress HR: 76  Rest BP: 129/58 Stress BP: 132/59  Exercise Time (min): n/a METS: n/a   Predicted Max HR: 162 bpm % Max HR: 46.91 bpm Rate Pressure Product: 53664   Dose of Adenosine (mg):  n/a Dose of Lexiscan: 0.4 mg  Dose of Atropine (mg): n/a Dose of Dobutamine: n/a mcg/kg/min (at max HR)  Stress Test Technologist: Milana Na, EMT-P  Nuclear Technologist:  Domenic Polite, CNMT     Rest Procedure:  Myocardial perfusion imaging was performed at rest 45 minutes following the intravenous administration of Technetium 26m Tetrofosmin. Rest ECG: NSR - Normal EKG  Stress Procedure:  The patient received IV Lexiscan 0.4 mg over  15-seconds.  Technetium 45m Tetrofosmin injected at 30-seconds.  There were no significant changes and rare pvcs with Lexiscan.  Quantitative spect images were obtained after a 45 minute delay. Stress ECG: No significant change from baseline ECG  QPS Raw Data Images:  Normal; no motion artifact; normal heart/lung ratio. Stress Images:  Normal homogeneous uptake in all areas of the myocardium. Rest Images:  Normal homogeneous uptake in all areas of the myocardium. Subtraction (SDS):  There is no evidence of scar or ischemia. Transient Ischemic Dilatation (Normal <1.22): 1.16 Lung/Heart Ratio (Normal <0.45):  0.37  Quantitative Gated Spect Images QGS EDV: 111 ml QGS ESV:  42 ml  Impression Exercise Capacity:  Lexiscan with no exercise. BP Response:  Normal blood pressure response. Clinical Symptoms:  Short of breath, fatigue ECG Impression:  No significant ST segment change suggestive of ischemia. Comparison with Prior Nuclear Study: No significant change from previous study  Overall Impression:  Normal stress nuclear study.  LV Ejection Fraction: 63%.  LV Wall Motion:  NL LV Function; NL Wall Motion    [employer]

## 2011-12-16 DIAGNOSIS — E119 Type 2 diabetes mellitus without complications: Secondary | ICD-10-CM | POA: Diagnosis not present

## 2011-12-16 DIAGNOSIS — Z961 Presence of intraocular lens: Secondary | ICD-10-CM | POA: Diagnosis not present

## 2011-12-16 DIAGNOSIS — E11319 Type 2 diabetes mellitus with unspecified diabetic retinopathy without macular edema: Secondary | ICD-10-CM | POA: Diagnosis not present

## 2012-01-05 ENCOUNTER — Other Ambulatory Visit: Payer: Self-pay | Admitting: Endocrinology

## 2012-01-05 ENCOUNTER — Other Ambulatory Visit: Payer: Self-pay | Admitting: Internal Medicine

## 2012-01-12 DIAGNOSIS — K223 Perforation of esophagus: Secondary | ICD-10-CM | POA: Diagnosis not present

## 2012-01-12 DIAGNOSIS — K222 Esophageal obstruction: Secondary | ICD-10-CM | POA: Diagnosis not present

## 2012-01-12 DIAGNOSIS — D649 Anemia, unspecified: Secondary | ICD-10-CM | POA: Diagnosis not present

## 2012-01-12 DIAGNOSIS — Z01818 Encounter for other preprocedural examination: Secondary | ICD-10-CM | POA: Diagnosis not present

## 2012-01-12 DIAGNOSIS — E119 Type 2 diabetes mellitus without complications: Secondary | ICD-10-CM | POA: Diagnosis not present

## 2012-01-12 DIAGNOSIS — Z01810 Encounter for preprocedural cardiovascular examination: Secondary | ICD-10-CM | POA: Diagnosis not present

## 2012-01-13 DIAGNOSIS — Z794 Long term (current) use of insulin: Secondary | ICD-10-CM | POA: Diagnosis not present

## 2012-01-13 DIAGNOSIS — E669 Obesity, unspecified: Secondary | ICD-10-CM | POA: Diagnosis not present

## 2012-01-13 DIAGNOSIS — Z6837 Body mass index (BMI) 37.0-37.9, adult: Secondary | ICD-10-CM | POA: Diagnosis not present

## 2012-01-13 DIAGNOSIS — E119 Type 2 diabetes mellitus without complications: Secondary | ICD-10-CM | POA: Diagnosis not present

## 2012-01-13 DIAGNOSIS — Z79899 Other long term (current) drug therapy: Secondary | ICD-10-CM | POA: Diagnosis not present

## 2012-01-13 DIAGNOSIS — Z01818 Encounter for other preprocedural examination: Secondary | ICD-10-CM | POA: Diagnosis not present

## 2012-01-13 DIAGNOSIS — Z01810 Encounter for preprocedural cardiovascular examination: Secondary | ICD-10-CM | POA: Diagnosis not present

## 2012-01-13 DIAGNOSIS — S88119A Complete traumatic amputation at level between knee and ankle, unspecified lower leg, initial encounter: Secondary | ICD-10-CM | POA: Diagnosis not present

## 2012-01-13 DIAGNOSIS — K929 Disease of digestive system, unspecified: Secondary | ICD-10-CM | POA: Diagnosis not present

## 2012-01-13 DIAGNOSIS — Z8619 Personal history of other infectious and parasitic diseases: Secondary | ICD-10-CM | POA: Diagnosis not present

## 2012-01-13 DIAGNOSIS — K219 Gastro-esophageal reflux disease without esophagitis: Secondary | ICD-10-CM | POA: Diagnosis not present

## 2012-01-13 DIAGNOSIS — K222 Esophageal obstruction: Secondary | ICD-10-CM | POA: Diagnosis not present

## 2012-01-13 DIAGNOSIS — Z87891 Personal history of nicotine dependence: Secondary | ICD-10-CM | POA: Diagnosis not present

## 2012-01-13 DIAGNOSIS — D649 Anemia, unspecified: Secondary | ICD-10-CM | POA: Diagnosis not present

## 2012-01-13 DIAGNOSIS — Z8614 Personal history of Methicillin resistant Staphylococcus aureus infection: Secondary | ICD-10-CM | POA: Diagnosis not present

## 2012-01-13 DIAGNOSIS — R269 Unspecified abnormalities of gait and mobility: Secondary | ICD-10-CM | POA: Diagnosis not present

## 2012-01-13 DIAGNOSIS — R0989 Other specified symptoms and signs involving the circulatory and respiratory systems: Secondary | ICD-10-CM | POA: Diagnosis not present

## 2012-01-17 ENCOUNTER — Other Ambulatory Visit: Payer: Self-pay | Admitting: Endocrinology

## 2012-01-26 ENCOUNTER — Encounter: Payer: Self-pay | Admitting: Endocrinology

## 2012-01-26 ENCOUNTER — Ambulatory Visit (INDEPENDENT_AMBULATORY_CARE_PROVIDER_SITE_OTHER): Payer: Medicare Other | Admitting: Endocrinology

## 2012-01-26 VITALS — BP 132/66 | HR 83 | Temp 98.4°F | Ht 74.0 in | Wt 226.0 lb

## 2012-01-26 DIAGNOSIS — J449 Chronic obstructive pulmonary disease, unspecified: Secondary | ICD-10-CM | POA: Diagnosis not present

## 2012-01-26 DIAGNOSIS — G8929 Other chronic pain: Secondary | ICD-10-CM

## 2012-01-26 DIAGNOSIS — E119 Type 2 diabetes mellitus without complications: Secondary | ICD-10-CM

## 2012-01-26 DIAGNOSIS — R0602 Shortness of breath: Secondary | ICD-10-CM

## 2012-01-26 MED ORDER — OXYCODONE HCL 5 MG PO TABS: ORAL_TABLET | ORAL | Status: DC

## 2012-01-26 MED ORDER — OXYCODONE HCL 20 MG PO TB12: 20.0000 mg | ORAL_TABLET | Freq: Two times a day (BID) | ORAL | Status: DC

## 2012-01-26 NOTE — Patient Instructions (Addendum)
Please come back for a "medicare wellness" appointment in 3 months. Here are 3 months of both pain medications. here is a sample of "advair-100."  take 1 puff 2x a day.  rinse mouth after using.  Refer to a lung specialist.  you will receive a phone call, about a day and time for an appointment. take all of the insulin with breakfast, and none in the evening.
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
## 2012-02-15 ENCOUNTER — Other Ambulatory Visit: Payer: Self-pay | Admitting: Endocrinology

## 2012-02-20 ENCOUNTER — Ambulatory Visit (INDEPENDENT_AMBULATORY_CARE_PROVIDER_SITE_OTHER): Payer: Medicare Other | Admitting: Internal Medicine

## 2012-02-20 ENCOUNTER — Encounter: Payer: Self-pay | Admitting: Internal Medicine

## 2012-02-20 VITALS — BP 114/62 | HR 90 | Temp 98.7°F | Ht 74.0 in | Wt 250.4 lb

## 2012-02-20 DIAGNOSIS — J449 Chronic obstructive pulmonary disease, unspecified: Secondary | ICD-10-CM

## 2012-02-20 DIAGNOSIS — G4730 Sleep apnea, unspecified: Secondary | ICD-10-CM | POA: Insufficient documentation

## 2012-02-20 MED ORDER — TIOTROPIUM BROMIDE MONOHYDRATE 18 MCG IN CAPS: 18.0000 ug | ORAL_CAPSULE | Freq: Every day | RESPIRATORY_TRACT | Status: DC

## 2012-02-20 MED ORDER — BUDESONIDE-FORMOTEROL FUMARATE 80-4.5 MCG/ACT IN AERO: 2.0000 | INHALATION_SPRAY | Freq: Two times a day (BID) | RESPIRATORY_TRACT | Status: DC

## 2012-02-20 NOTE — Patient Instructions (Addendum)
#  COPD You have severe copd Because advair was not helping stop it Instead Please start symbicort 80/4.5 2 puff twice daily - take sample, script and show technique Also Please start spiriva 1 puff daily - take sample, script and show technique I have referred you to pulmonary rehab program REturn in 2 months or sooner if needed CAT score at followup   #Sleep apnea  - will do epworth sleep apnea score later

## 2012-02-20 NOTE — Assessment & Plan Note (Signed)
#  COPD You have severe copd Because advair was not helping stop it Instead Please start symbicort 80/4.5 2 puff twice daily - take sample, script and show technique Also Please start spiriva 1 puff daily - take sample, script and show technique I have referred you to pulmonary rehab program REturn in 2 months or sooner if needed CAT score at followup

## 2012-02-20 NOTE — Assessment & Plan Note (Signed)
High pre test prob for sleep apnea  Plan epworth score at a future visit
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
## 2012-02-26 ENCOUNTER — Other Ambulatory Visit: Payer: Self-pay | Admitting: Endocrinology

## 2012-02-26 NOTE — Telephone Encounter (Signed)
Please advise on refill.

## 2012-02-26 NOTE — Telephone Encounter (Signed)
Rx faxed to [facility]. 

## 2012-03-11 ENCOUNTER — Other Ambulatory Visit (INDEPENDENT_AMBULATORY_CARE_PROVIDER_SITE_OTHER): Payer: Medicare Other

## 2012-03-11 DIAGNOSIS — I2510 Atherosclerotic heart disease of native coronary artery without angina pectoris: Secondary | ICD-10-CM

## 2012-03-11 LAB — LIPID PANEL
Cholesterol: 147 mg/dL (ref 0–200)
HDL: 31.5 mg/dL — ABNORMAL LOW (ref >39.00)
Triglycerides: 210 mg/dL — ABNORMAL HIGH (ref 0.0–149.0)
VLDL: 42 mg/dL — ABNORMAL HIGH (ref 0.0–40.0)

## 2012-03-11 LAB — HEPATIC FUNCTION PANEL: Albumin: 4 g/dL (ref 3.5–5.2)

## 2012-03-22 ENCOUNTER — Telehealth: Payer: Self-pay | Admitting: *Deleted

## 2012-03-22 NOTE — Telephone Encounter (Signed)
R'cd call from from [facility] for pt. They states that notes sent for pt's prosthetics needed more information per Medicare in order to receive payment and they need amended notes stating that pt is a K level 3 and that he has the potential to use his prothesis for community ambulation and has ability to vary cadence. Amended note can be faxed to [phone].

## 2012-03-23 NOTE — Telephone Encounter (Signed)
Letter faxed to [employer] representative.

## 2012-03-24 ENCOUNTER — Telehealth: Payer: Self-pay | Admitting: Cardiovascular Disease

## 2012-03-24 NOTE — Telephone Encounter (Signed)
Patient returning nurse call, he can be reached at [phone].

## 2012-03-24 NOTE — Telephone Encounter (Signed)
PT AWARE OF LAB RESULTS./CY 

## 2012-03-25 ENCOUNTER — Other Ambulatory Visit (INDEPENDENT_AMBULATORY_CARE_PROVIDER_SITE_OTHER): Payer: Medicare Other

## 2012-03-25 ENCOUNTER — Encounter: Payer: Self-pay | Admitting: Endocrinology

## 2012-03-25 ENCOUNTER — Ambulatory Visit (INDEPENDENT_AMBULATORY_CARE_PROVIDER_SITE_OTHER): Payer: Medicare Other | Admitting: Endocrinology

## 2012-03-25 VITALS — BP 126/62 | HR 83 | Temp 99.4°F | Ht 74.0 in | Wt 262.0 lb

## 2012-03-25 DIAGNOSIS — Z125 Encounter for screening for malignant neoplasm of prostate: Secondary | ICD-10-CM

## 2012-03-25 DIAGNOSIS — R209 Unspecified disturbances of skin sensation: Secondary | ICD-10-CM

## 2012-03-25 DIAGNOSIS — Z0000 Encounter for general adult medical examination without abnormal findings: Secondary | ICD-10-CM

## 2012-03-25 DIAGNOSIS — D509 Iron deficiency anemia, unspecified: Secondary | ICD-10-CM

## 2012-03-25 DIAGNOSIS — E109 Type 1 diabetes mellitus without complications: Secondary | ICD-10-CM

## 2012-03-25 LAB — URINALYSIS, ROUTINE W REFLEX MICROSCOPIC
Ketones, ur: NEGATIVE
Leukocytes, UA: NEGATIVE
Specific Gravity, Urine: >=1.03 (ref 1.000–1.030)
Total Protein, Urine: NEGATIVE
pH: 5.5 (ref 5.0–8.0)

## 2012-03-25 LAB — CBC WITH DIFFERENTIAL/PLATELET
Basophils Absolute: 0 10*3/uL (ref 0.0–0.1)
Eosinophils Absolute: 0.1 10*3/uL (ref 0.0–0.7)
Lymphocytes Relative: 49.4 % — ABNORMAL HIGH (ref 12.0–46.0)
MCHC: 33.8 g/dL (ref 30.0–36.0)
MCV: 99.1 fl (ref 78.0–100.0)
Monocytes Absolute: 0.5 10*3/uL (ref 0.1–1.0)
Neutro Abs: 2.2 10*3/uL (ref 1.4–7.7)
Neutrophils Relative %: 40.1 % — ABNORMAL LOW (ref 43.0–77.0)
RDW: 15.6 % — ABNORMAL HIGH (ref 11.5–14.6)

## 2012-03-25 LAB — IBC PANEL
Saturation Ratios: 29 % (ref 20.0–50.0)
Transferrin: 300 mg/dL (ref 212.0–360.0)

## 2012-03-25 LAB — TSH: TSH: 1.22 u[IU]/mL (ref 0.35–5.50)

## 2012-03-25 LAB — MICROALBUMIN / CREATININE URINE RATIO: Creatinine,U: 236.4 mg/dL
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
## 2012-03-25 NOTE — Patient Instructions (Addendum)
blood tests are being requested for you today.  You will receive a letter with results. Change insulin to 105 units with breakfast, and 60 units with the evening meal. Please come back for a follow-up appointment in 3 months please consider these measures for your health:  minimize alcohol.  do not use tobacco products.  have a colonoscopy at least every 10 years from age 61.  keep firearms safely stored.  always use seat belts.  have working smoke alarms in your home.  see an eye doctor and dentist regularly.  never drive under the influence of alcohol or drugs (including prescription drugs).  those with fair skin should take precautions against the sun.  good diet and exercise habits significanly improve the control of your diabetes.  please let me know if you wish to be referred to a dietician.  high blood sugar is very risky to your health.  you should see an eye doctor every year. controlling your blood pressure and cholesterol drastically reduces the damage diabetes does to your body.  this also applies to quitting smoking.  please discuss these with your doctor.  you should take an aspirin every day, unless you have been advised by a doctor not to. check your blood sugar twice a day.  vary the time of day when you check, between before the 3 meals, and at bedtime.  also check if you have symptoms of your blood sugar being too high or too low.  please keep a record of the readings and bring it to your next appointment here.  please call us sooner if your blood sugar goes below 70, or if it stays over 200.

## 2012-03-26 ENCOUNTER — Encounter: Payer: Self-pay | Admitting: Endocrinology

## 2012-03-30 ENCOUNTER — Telehealth: Payer: Self-pay | Admitting: *Deleted

## 2012-03-30 NOTE — Telephone Encounter (Signed)
Letter given to [place].Marland KitchenMarland Kitchen7/16/13@3 :44pm/LMB

## 2012-03-30 NOTE — Telephone Encounter (Signed)
Message copied by Deatra James on Tue Mar 30, 2012  3:43 PM ------      Message from: [name]      Created: Fri Mar 26, 2012  8:56 PM       Please give this letter to lou, thank you.

## 2012-03-30 NOTE — Telephone Encounter (Signed)
Mailed out pt lab letter... 03/30/12@2 :54om/LMB

## 2012-04-07 ENCOUNTER — Telehealth: Payer: Self-pay | Admitting: Endocrinology

## 2012-04-07 NOTE — Telephone Encounter (Signed)
Dismissal Letter sent by Certified Mail 04/07/2012  Received the Return Receipt showing the patient has picked up the Dismissal Letter 04/13/2012

## 2012-04-12 ENCOUNTER — Ambulatory Visit: Payer: 59 | Admitting: Endocrinology

## 2012-04-20 ENCOUNTER — Other Ambulatory Visit: Payer: Self-pay | Admitting: Endocrinology

## 2012-04-20 ENCOUNTER — Ambulatory Visit (INDEPENDENT_AMBULATORY_CARE_PROVIDER_SITE_OTHER): Payer: 59 | Admitting: Family Medicine

## 2012-04-20 VITALS — BP 164/78 | HR 65 | Temp 98.3°F | Resp 18 | Wt 266.0 lb

## 2012-04-20 DIAGNOSIS — J449 Chronic obstructive pulmonary disease, unspecified: Secondary | ICD-10-CM

## 2012-04-20 DIAGNOSIS — I10 Essential (primary) hypertension: Secondary | ICD-10-CM | POA: Diagnosis not present

## 2012-04-20 DIAGNOSIS — K219 Gastro-esophageal reflux disease without esophagitis: Secondary | ICD-10-CM

## 2012-04-20 DIAGNOSIS — G8921 Chronic pain due to trauma: Secondary | ICD-10-CM

## 2012-04-20 DIAGNOSIS — IMO0001 Reserved for inherently not codable concepts without codable children: Secondary | ICD-10-CM

## 2012-04-20 MED ORDER — VARDENAFIL HCL 2.5 MG PO TABS: ORAL_TABLET | ORAL | Status: DC

## 2012-04-20 MED ORDER — ESOMEPRAZOLE MAGNESIUM 40 MG PO CPDR
40.0000 mg | DELAYED_RELEASE_CAPSULE | Freq: Every day | ORAL | Status: DC
Start: 2012-04-20 — End: 2012-11-04

## 2012-04-20 MED ORDER — LORAZEPAM 0.5 MG PO TABS: ORAL_TABLET | ORAL | Status: DC

## 2012-04-20 MED ORDER — INSULIN LISPRO PROT & LISPRO (50-50 MIX) 100 UNIT/ML ~~LOC~~ SUSP: SUBCUTANEOUS | Status: DC

## 2012-04-20 MED ORDER — TRIAMCINOLONE ACETONIDE 0.1 % EX CREA: TOPICAL_CREAM | Freq: Three times a day (TID) | CUTANEOUS | Status: DC

## 2012-04-20 MED ORDER — OXYCODONE HCL 20 MG PO TB12: 20.0000 mg | ORAL_TABLET | Freq: Two times a day (BID) | ORAL | Status: DC

## 2012-04-20 MED ORDER — ZOLPIDEM TARTRATE 10 MG PO TABS: 10.0000 mg | ORAL_TABLET | Freq: Every evening | ORAL | Status: DC | PRN

## 2012-04-20 MED ORDER — OXYCODONE HCL 5 MG PO TABS: ORAL_TABLET | ORAL | Status: DC
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
## 2012-04-23 ENCOUNTER — Encounter: Payer: Self-pay | Admitting: Internal Medicine

## 2012-04-23 ENCOUNTER — Ambulatory Visit (INDEPENDENT_AMBULATORY_CARE_PROVIDER_SITE_OTHER): Payer: 59 | Admitting: Internal Medicine

## 2012-04-23 VITALS — BP 132/62 | HR 83 | Temp 98.3°F | Ht 73.0 in | Wt 265.2 lb

## 2012-04-23 DIAGNOSIS — J449 Chronic obstructive pulmonary disease, unspecified: Secondary | ICD-10-CM

## 2012-04-23 NOTE — Patient Instructions (Addendum)
#  COPD You have severe copd Because symbicort increased blood sugars my cma will add it in allergy list Take 2-3 samples of low dose Dulera, take it 2 puff twice daily  Call in 1 month on how your sugars are handling it. IF okay, then we will do script for you Re-=refer rehab to see if rehab needed to improve dyspnea  #Followup Return in 3 months or sooner if needed Alpha 1 check and epworth sleep apnea score later at some point in folllowup
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
## 2012-04-25 ENCOUNTER — Encounter: Payer: Self-pay | Admitting: Family Medicine

## 2012-05-02 NOTE — Assessment & Plan Note (Signed)
#  COPD You have severe copd Because symbicort increased blood sugars my cma will add it in allergy list Take 2-3 samples of low dose Dulera, take it 2 puff twice daily  Call in 1 month on how your sugars are handling it. IF okay, then we will do script for you  #Followup Return in 3 months or sooner if needed Alpha 1 check and epworth sleep apnea score later at some point in folllowup

## 2012-05-07 DIAGNOSIS — H81399 Other peripheral vertigo, unspecified ear: Secondary | ICD-10-CM | POA: Diagnosis not present

## 2012-05-07 DIAGNOSIS — M542 Cervicalgia: Secondary | ICD-10-CM | POA: Diagnosis not present

## 2012-05-07 DIAGNOSIS — G541 Lumbosacral plexus disorders: Secondary | ICD-10-CM | POA: Diagnosis not present

## 2012-05-07 DIAGNOSIS — Z79899 Other long term (current) drug therapy: Secondary | ICD-10-CM | POA: Diagnosis not present

## 2012-05-07 DIAGNOSIS — R269 Unspecified abnormalities of gait and mobility: Secondary | ICD-10-CM | POA: Diagnosis not present

## 2012-05-07 DIAGNOSIS — R209 Unspecified disturbances of skin sensation: Secondary | ICD-10-CM | POA: Diagnosis not present

## 2012-05-07 DIAGNOSIS — M533 Sacrococcygeal disorders, not elsewhere classified: Secondary | ICD-10-CM | POA: Diagnosis not present

## 2012-05-07 DIAGNOSIS — M961 Postlaminectomy syndrome, not elsewhere classified: Secondary | ICD-10-CM | POA: Diagnosis not present

## 2012-05-20 ENCOUNTER — Ambulatory Visit: Payer: Medicare Other | Attending: Anesthesiology | Admitting: Physical Therapy

## 2012-05-20 DIAGNOSIS — IMO0001 Reserved for inherently not codable concepts without codable children: Secondary | ICD-10-CM | POA: Insufficient documentation

## 2012-05-20 DIAGNOSIS — M2560 Stiffness of unspecified joint, not elsewhere classified: Secondary | ICD-10-CM | POA: Insufficient documentation

## 2012-05-20 DIAGNOSIS — M2550 Pain in unspecified joint: Secondary | ICD-10-CM | POA: Diagnosis not present

## 2012-05-20 DIAGNOSIS — R269 Unspecified abnormalities of gait and mobility: Secondary | ICD-10-CM | POA: Insufficient documentation

## 2012-05-20 DIAGNOSIS — R5381 Other malaise: Secondary | ICD-10-CM | POA: Insufficient documentation

## 2012-05-24 ENCOUNTER — Ambulatory Visit: Payer: Medicare Other | Admitting: Physical Therapy

## 2012-05-26 ENCOUNTER — Ambulatory Visit: Payer: Medicare Other | Admitting: Physical Therapy

## 2012-06-02 ENCOUNTER — Ambulatory Visit: Payer: 59 | Admitting: Physical Therapy

## 2012-06-02 ENCOUNTER — Ambulatory Visit: Payer: Medicare Other | Admitting: Physical Therapy

## 2012-06-02 DIAGNOSIS — F4320 Adjustment disorder, unspecified: Secondary | ICD-10-CM | POA: Diagnosis not present

## 2012-06-07 DIAGNOSIS — H81399 Other peripheral vertigo, unspecified ear: Secondary | ICD-10-CM | POA: Diagnosis not present

## 2012-06-07 DIAGNOSIS — M533 Sacrococcygeal disorders, not elsewhere classified: Secondary | ICD-10-CM | POA: Diagnosis not present

## 2012-06-07 DIAGNOSIS — G541 Lumbosacral plexus disorders: Secondary | ICD-10-CM | POA: Diagnosis not present

## 2012-06-07 DIAGNOSIS — M961 Postlaminectomy syndrome, not elsewhere classified: Secondary | ICD-10-CM | POA: Diagnosis not present

## 2012-06-08 ENCOUNTER — Ambulatory Visit: Payer: Medicare Other | Admitting: Physical Therapy

## 2012-06-08 ENCOUNTER — Ambulatory Visit (INDEPENDENT_AMBULATORY_CARE_PROVIDER_SITE_OTHER): Payer: Medicare Other | Admitting: Family Medicine

## 2012-06-08 ENCOUNTER — Other Ambulatory Visit: Payer: Self-pay | Admitting: Endocrinology

## 2012-06-08 ENCOUNTER — Encounter: Payer: Self-pay | Admitting: Family Medicine

## 2012-06-08 VITALS — BP 134/56 | HR 64 | Temp 98.7°F | Resp 16 | Ht 73.0 in | Wt 274.4 lb

## 2012-06-08 DIAGNOSIS — L0291 Cutaneous abscess, unspecified: Secondary | ICD-10-CM

## 2012-06-08 DIAGNOSIS — IMO0001 Reserved for inherently not codable concepts without codable children: Secondary | ICD-10-CM

## 2012-06-08 DIAGNOSIS — T24209A Burn of second degree of unspecified site of unspecified lower limb, except ankle and foot, initial encounter: Secondary | ICD-10-CM

## 2012-06-08 DIAGNOSIS — L0390 Cellulitis, unspecified: Secondary | ICD-10-CM

## 2012-06-08 MED ORDER — CEPHALEXIN 500 MG PO CAPS: 500.0000 mg | ORAL_CAPSULE | Freq: Three times a day (TID) | ORAL | Status: DC

## 2012-06-08 MED ORDER — SILVER SULFADIAZINE 1 % EX CREA: TOPICAL_CREAM | Freq: Every day | CUTANEOUS | Status: DC

## 2012-06-08 NOTE — Patient Instructions (Signed)
Take the antibiotics and continue using the Silvadene cream. Return if in all worse.
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
## 2012-06-10 ENCOUNTER — Ambulatory Visit: Payer: Medicare Other | Admitting: Physical Therapy

## 2012-06-14 ENCOUNTER — Ambulatory Visit: Payer: 59 | Admitting: Physical Therapy

## 2012-06-16 ENCOUNTER — Ambulatory Visit: Payer: Medicare Other | Attending: Anesthesiology | Admitting: Physical Therapy

## 2012-06-16 DIAGNOSIS — IMO0001 Reserved for inherently not codable concepts without codable children: Secondary | ICD-10-CM | POA: Insufficient documentation

## 2012-06-16 DIAGNOSIS — R269 Unspecified abnormalities of gait and mobility: Secondary | ICD-10-CM | POA: Insufficient documentation

## 2012-06-16 DIAGNOSIS — R5381 Other malaise: Secondary | ICD-10-CM | POA: Diagnosis not present

## 2012-06-16 DIAGNOSIS — M2560 Stiffness of unspecified joint, not elsewhere classified: Secondary | ICD-10-CM | POA: Diagnosis not present

## 2012-06-16 DIAGNOSIS — M2550 Pain in unspecified joint: Secondary | ICD-10-CM | POA: Insufficient documentation

## 2012-06-22 ENCOUNTER — Ambulatory Visit: Payer: Medicare Other | Admitting: Physical Therapy

## 2012-06-24 ENCOUNTER — Ambulatory Visit: Payer: Medicare Other | Admitting: Physical Therapy

## 2012-06-24 ENCOUNTER — Ambulatory Visit: Payer: 59 | Admitting: Endocrinology

## 2012-06-26 ENCOUNTER — Other Ambulatory Visit: Payer: Self-pay | Admitting: Endocrinology

## 2012-06-29 ENCOUNTER — Encounter: Payer: 59 | Admitting: Physical Therapy

## 2012-07-01 ENCOUNTER — Encounter: Payer: 59 | Admitting: Physical Therapy

## 2012-07-06 ENCOUNTER — Ambulatory Visit: Payer: Medicare Other | Admitting: Physical Therapy

## 2012-07-08 ENCOUNTER — Telehealth (HOSPITAL_COMMUNITY): Payer: Self-pay | Admitting: *Deleted

## 2012-07-08 ENCOUNTER — Ambulatory Visit: Payer: Medicare Other | Admitting: Physical Therapy

## 2012-07-08 NOTE — Telephone Encounter (Signed)
Mr. [name] was contacted on 07/06/2012 regarding referral to Pulmonary Rehab.  He is interested, he will check his insurance coverage and call us when he is ready to schedule.

## 2012-07-13 ENCOUNTER — Ambulatory Visit: Payer: Medicare Other | Admitting: Physical Therapy

## 2012-07-15 ENCOUNTER — Ambulatory Visit: Payer: Medicare Other | Admitting: Physical Therapy

## 2012-07-20 DIAGNOSIS — G541 Lumbosacral plexus disorders: Secondary | ICD-10-CM | POA: Diagnosis not present

## 2012-07-20 DIAGNOSIS — M545 Low back pain: Secondary | ICD-10-CM | POA: Diagnosis not present

## 2012-07-20 DIAGNOSIS — H81399 Other peripheral vertigo, unspecified ear: Secondary | ICD-10-CM | POA: Diagnosis not present

## 2012-07-20 DIAGNOSIS — M961 Postlaminectomy syndrome, not elsewhere classified: Secondary | ICD-10-CM | POA: Diagnosis not present

## 2012-07-20 DIAGNOSIS — Z79899 Other long term (current) drug therapy: Secondary | ICD-10-CM | POA: Diagnosis not present

## 2012-07-20 DIAGNOSIS — M533 Sacrococcygeal disorders, not elsewhere classified: Secondary | ICD-10-CM | POA: Diagnosis not present

## 2012-07-21 ENCOUNTER — Ambulatory Visit (INDEPENDENT_AMBULATORY_CARE_PROVIDER_SITE_OTHER): Payer: Medicare Other | Admitting: Family Medicine

## 2012-07-21 VITALS — BP 134/67 | HR 76 | Temp 98.5°F | Resp 18 | Ht 72.0 in | Wt 273.0 lb

## 2012-07-21 DIAGNOSIS — IMO0001 Reserved for inherently not codable concepts without codable children: Secondary | ICD-10-CM | POA: Diagnosis not present

## 2012-07-21 DIAGNOSIS — K219 Gastro-esophageal reflux disease without esophagitis: Secondary | ICD-10-CM

## 2012-07-21 DIAGNOSIS — E785 Hyperlipidemia, unspecified: Secondary | ICD-10-CM

## 2012-07-21 DIAGNOSIS — Z23 Encounter for immunization: Secondary | ICD-10-CM

## 2012-07-21 LAB — CBC WITH DIFFERENTIAL/PLATELET
Eosinophils Relative: 2 % (ref 0–5)
HCT: 35.2 % — ABNORMAL LOW (ref 39.0–52.0)
Hemoglobin: 12.4 g/dL — ABNORMAL LOW (ref 13.0–17.0)
Lymphocytes Relative: 56 % — ABNORMAL HIGH (ref 12–46)
Lymphs Abs: 3.7 10*3/uL (ref 0.7–4.0)
MCV: 95.7 fL (ref 78.0–100.0)
Monocytes Absolute: 0.5 10*3/uL (ref 0.1–1.0)
Platelets: 321 10*3/uL (ref 150–400)
RBC: 3.68 MIL/uL — ABNORMAL LOW (ref 4.22–5.81)
WBC: 6.6 10*3/uL (ref 4.0–10.5)

## 2012-07-21 MED ORDER — INSULIN GLARGINE 100 UNIT/ML ~~LOC~~ SOLN: 40.0000 [IU] | Freq: Two times a day (BID) | SUBCUTANEOUS | Status: DC

## 2012-07-21 MED ORDER — ALBUTEROL SULFATE HFA 108 (90 BASE) MCG/ACT IN AERS: 2.0000 | INHALATION_SPRAY | Freq: Four times a day (QID) | RESPIRATORY_TRACT | Status: DC | PRN

## 2012-07-21 MED ORDER — INSULIN PEN NEEDLE 31G X 8 MM MISC: Status: DC

## 2012-07-21 NOTE — Progress Notes (Signed)
S: This 61 y.o. Cauc male is here with his wife. She is a Associate Professor and aware of current medication costs; also states Shingles vaccine not covered if pt tries to get it before age 59.  DM: Low FSBS= 60s; not following a meal plan and tends to skip meal; cannot eat in evening due to severe GERD. Would like to try a different type of Insulin.  GERD:  Increased nocturnal symptoms. Needs to see GI specialist. States he has to sleep in recliner due to severe reflux symptoms.  Chronic Pain Mgmt: not happy about care at [facility]. (Pt voices understanding of [facility] pain management policy as explained by me).  ROS: Negative for change in appetite, fatigue, fever/chills, CP or tightness, palpitations, SOB or cough, HA, numbness, weakness or syncope.           Positive for ongoing E.D. Viagra not effective.   O:  Filed Vitals:   07/21/12 1117  BP: 134/67  Pulse: 76  Temp: 98.5 F (36.9 C)  Resp: 18    GEN: In NAD; WN,WD. HENT: [place]/AT. EOMI w/ clear conj and sclerae. Otherwise unremarkable. NECK: No LAN or TMG. COR: RRR. LUNGS: Normal resp rate and effort. EXT: Right lower prosthetic; left leg intact. NEURO: A&O x 3; CNs intact. Nonfocal.  A1c= 7.4%  A/P: 1. Type II or unspecified type diabetes mellitus without mention of complication, uncontrolled  CBC with Differential, ALT RX: Lantus Solostar pen (insulin change)- start at 40 units bid (dose about 12 hours apart).  If fasting FSBS > 150, add 2 units every 4-5 days until fasting BS ~ 100-120.  Check FSBS 2x daily. Wife is very supportive and will assist pt with nutrition challenges and monitoring BS.  2. Other and unspecified hyperlipidemia  Lipid panel, ALT  3. GERD (gastroesophageal reflux disease)  Ambulatory referral to Gastroenterology  4. Need for prophylactic vaccination with combined diphtheria-tetanus-pertussis (DTP) vaccine  Tdap vaccine greater than or equal to 7yo IM   5. E.D.                                                                      RX: Levitra 2.5 mg  To be taken on daily basis.  RTC in 3 months.

## 2012-07-21 NOTE — Patient Instructions (Addendum)
The type of Insulin you use has been changes to Lantus (glarginine is the generic). You will start with 40 units injected under the skin twice a day, about 12 hours apart. Continue to check your sugars 2x daily. If your fastinig is above 150, add 2 units to each injection.  If you get any lows below 70, do not increase your dose. Contact the office if you have any questions.

## 2012-07-23 NOTE — Progress Notes (Signed)
Quick Note:  Please call pt and advise that the following labs are abnormal... HDL ("good") cholesterol way to low but ,good news is, LDL ("bad") chol is at target for Diabetic pts.  Niacin (Niaspan) is the medication that may help increase your HDL chol to at least 39. Your risk of heart disease is about average with your current lipid levels. Your triglycerides are better than 4 months ago. Regular physical activity will help raise your HDL .  I may have to add a different medication (Niaspan) or switch you to a combination med that has a statin (like Pravastatin) + niacin. I am not sure if this would cost the same as or more than the medicine you are taking now.  Continue to work on Diabetes control, stay as active as you can and we can recheck these numbers at your next visit.  Copy to pt. ______

## 2012-07-26 ENCOUNTER — Encounter: Payer: Self-pay | Admitting: Internal Medicine

## 2012-07-26 ENCOUNTER — Ambulatory Visit (INDEPENDENT_AMBULATORY_CARE_PROVIDER_SITE_OTHER): Payer: Medicare Other | Admitting: Internal Medicine

## 2012-07-26 ENCOUNTER — Other Ambulatory Visit: Payer: Medicare Other

## 2012-07-26 ENCOUNTER — Encounter: Payer: Self-pay | Admitting: Family Medicine

## 2012-07-26 VITALS — BP 108/76 | HR 77 | Temp 98.6°F | Ht 73.0 in | Wt 265.0 lb

## 2012-07-26 DIAGNOSIS — J449 Chronic obstructive pulmonary disease, unspecified: Secondary | ICD-10-CM

## 2012-07-26 NOTE — Patient Instructions (Addendum)
#  COPD Because symbicort and dulera increased blood sugars my cma will add it in allergy list Let us try advair 100/50, 1 putt twice daily; take 2 months worth of samples.  Call in 1 month on how your sugars are handling it. IF okay, then we will do script for you. Otherwise, will do serevent alone with inhaled steroids Glad you had flu shot Respect your desire not to undergo pulmonary rehab Please do blood work for alpha 1 genetic test for copd  #Followup Return in 3 months or sooner if needed Epworth sleep apnea score later at some point in folllowup

## 2012-07-26 NOTE — Assessment & Plan Note (Signed)
#  COPD Because symbicort and dulera increased blood sugars my cma will add it in allergy list Let us try advair 100/50, 1 putt twice daily; take 2 months worth of samples.  Call in 1 month on how your sugars are handling it. IF okay, then we will do script for you. Otherwise, will do serevent alone with inhaled steroids Glad you had flu shot Respect your desire not to undergo pulmonary rehab Please do blood work for alpha 1 genetic test for copd  #Followup Return in 3 months or sooner if needed Epworth sleep apnea score later at some point in folllowup  
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
## 2012-07-28 ENCOUNTER — Other Ambulatory Visit: Payer: Self-pay | Admitting: Family Medicine

## 2012-07-28 DIAGNOSIS — K219 Gastro-esophageal reflux disease without esophagitis: Secondary | ICD-10-CM | POA: Diagnosis not present

## 2012-07-28 DIAGNOSIS — R1310 Dysphagia, unspecified: Secondary | ICD-10-CM | POA: Diagnosis not present

## 2012-07-28 DIAGNOSIS — R112 Nausea with vomiting, unspecified: Secondary | ICD-10-CM | POA: Diagnosis not present

## 2012-07-28 MED ORDER — SIMVASTATIN 40 MG PO TABS: 40.0000 mg | ORAL_TABLET | Freq: Every day | ORAL | Status: DC

## 2012-07-28 NOTE — Progress Notes (Signed)
I reviewed pt's medications and he is already taking Pravastatin 40 mg daily. Rather than add Niaspan, I am discontinuing Pravastatin and starting Simvastatin 40 mg 1 tab at bedtime. Lipid profile will be rechecked at next visit.

## 2012-08-03 LAB — ALPHA-1 ANTITRYPSIN PHENOTYPE: A-1 Antitrypsin: 150 mg/dL (ref 83–199)

## 2012-08-04 ENCOUNTER — Telehealth: Payer: Self-pay | Admitting: Internal Medicine

## 2012-08-04 MED ORDER — MONTELUKAST SODIUM 10 MG PO TABS: 10.0000 mg | ORAL_TABLET | Freq: Every day | ORAL | Status: DC

## 2012-08-04 NOTE — Telephone Encounter (Signed)
That is fine for him to start singular 10mg  qhs but he should stop advair and instead also take serevent mdi 1 puff bid, 30 day supply with 3 refills

## 2012-08-04 NOTE — Progress Notes (Signed)
Quick Note:  ATC pt, no answer. LMOMTCB ______ 

## 2012-08-04 NOTE — Telephone Encounter (Signed)
Spoke with patient-states he has serevent at home and will start using; does need RX sent to [facility] for Singulair; I have sent 30 day supply with 1 refill.

## 2012-08-04 NOTE — Telephone Encounter (Signed)
Spoke with patient, made him aware of alpha 1 results as listed below per Dr. Marchelle Gearing.  Patient verbalized understanding.  Patient also wanted to let Dr. Marchelle Gearing know that the advair that was start 07/26/12 is not working and is increasing his blood sugar.  Patient states he "heard something about singulair" and that its "pretty good."  He would like to see if he can try this.  Dr. Marchelle Gearing please advise. Thank You

## 2012-08-04 NOTE — Telephone Encounter (Signed)
LMTCB

## 2012-08-06 ENCOUNTER — Telehealth: Payer: Self-pay

## 2012-08-06 NOTE — Telephone Encounter (Signed)
Error

## 2012-08-17 DIAGNOSIS — M533 Sacrococcygeal disorders, not elsewhere classified: Secondary | ICD-10-CM | POA: Diagnosis not present

## 2012-08-17 DIAGNOSIS — H81399 Other peripheral vertigo, unspecified ear: Secondary | ICD-10-CM | POA: Diagnosis not present

## 2012-08-17 DIAGNOSIS — M961 Postlaminectomy syndrome, not elsewhere classified: Secondary | ICD-10-CM | POA: Diagnosis not present

## 2012-08-17 DIAGNOSIS — G541 Lumbosacral plexus disorders: Secondary | ICD-10-CM | POA: Diagnosis not present

## 2012-08-18 ENCOUNTER — Encounter: Payer: Self-pay | Admitting: Family Medicine

## 2012-08-18 DIAGNOSIS — IMO0002 Reserved for concepts with insufficient information to code with codable children: Secondary | ICD-10-CM | POA: Insufficient documentation

## 2012-08-19 ENCOUNTER — Telehealth: Payer: Self-pay

## 2012-08-19 NOTE — Telephone Encounter (Signed)
[name] FROM [facility] STATES THEY HAVE EVERYTHING THEY NEED EXCEPT THEY NEED A WRITTEN PRESCRIPTION FOR THE LIFT CHAIR PLEASE FAX TO [phone] AND YOU MAY REACH HER AT [phone] IF NEEDED

## 2012-08-19 NOTE — Telephone Encounter (Signed)
Did you write for a lift chair?

## 2012-08-19 NOTE — Telephone Encounter (Signed)
I have written RX and will have staff at 104 fax it.

## 2012-08-30 DIAGNOSIS — R1310 Dysphagia, unspecified: Secondary | ICD-10-CM | POA: Diagnosis not present

## 2012-08-30 DIAGNOSIS — K219 Gastro-esophageal reflux disease without esophagitis: Secondary | ICD-10-CM | POA: Diagnosis not present

## 2012-09-02 DIAGNOSIS — R1310 Dysphagia, unspecified: Secondary | ICD-10-CM | POA: Diagnosis not present

## 2012-09-05 ENCOUNTER — Other Ambulatory Visit: Payer: Self-pay | Admitting: Endocrinology

## 2012-09-06 NOTE — Telephone Encounter (Signed)
Sonya, I don't think his med was refilled since 10/2010. Can you please call pt and ask if he is using it? And if so, if we are doing the refills?

## 2012-09-13 ENCOUNTER — Other Ambulatory Visit: Payer: Self-pay | Admitting: Endocrinology

## 2012-09-14 DIAGNOSIS — M961 Postlaminectomy syndrome, not elsewhere classified: Secondary | ICD-10-CM | POA: Diagnosis not present

## 2012-09-14 DIAGNOSIS — M533 Sacrococcygeal disorders, not elsewhere classified: Secondary | ICD-10-CM | POA: Diagnosis not present

## 2012-09-14 DIAGNOSIS — H81399 Other peripheral vertigo, unspecified ear: Secondary | ICD-10-CM | POA: Diagnosis not present

## 2012-09-14 DIAGNOSIS — G541 Lumbosacral plexus disorders: Secondary | ICD-10-CM | POA: Diagnosis not present

## 2012-09-27 ENCOUNTER — Other Ambulatory Visit: Payer: Self-pay | Admitting: *Deleted

## 2012-09-27 ENCOUNTER — Other Ambulatory Visit: Payer: Self-pay | Admitting: Endocrinology

## 2012-09-27 MED ORDER — INSULIN LISPRO PROT & LISPRO (50-50 MIX) 100 UNIT/ML ~~LOC~~ SUSP: SUBCUTANEOUS | Status: DC

## 2012-09-27 NOTE — Addendum Note (Signed)
Addended by: Elnora Morrison on: 09/27/2012 10:09 AM   Modules accepted: Orders

## 2012-09-27 NOTE — Telephone Encounter (Signed)
No longer a pt here

## 2012-09-27 NOTE — Telephone Encounter (Signed)
PATIENT REQUEST REFILL ON LYRICA. PLEASE ADVISE.

## 2012-09-30 ENCOUNTER — Encounter: Payer: Self-pay | Admitting: Family Medicine

## 2012-10-03 ENCOUNTER — Other Ambulatory Visit: Payer: Self-pay | Admitting: Endocrinology

## 2012-10-05 ENCOUNTER — Telehealth: Payer: Self-pay

## 2012-10-05 NOTE — Telephone Encounter (Signed)
Patients wife calling to check the status of his 3 prescriptions.   [phone]

## 2012-10-05 NOTE — Telephone Encounter (Signed)
Left message for call back, which meds does he need?

## 2012-10-06 ENCOUNTER — Telehealth: Payer: Self-pay

## 2012-10-06 MED ORDER — FUROSEMIDE 40 MG PO TABS: 40.0000 mg | ORAL_TABLET | Freq: Every day | ORAL | Status: DC

## 2012-10-06 MED ORDER — INSULIN LISPRO PROT & LISPRO (50-50 MIX) 100 UNIT/ML ~~LOC~~ SUSP: SUBCUTANEOUS | Status: DC

## 2012-10-06 MED ORDER — PREGABALIN 100 MG PO CAPS: 100.0000 mg | ORAL_CAPSULE | Freq: Three times a day (TID) | ORAL | Status: DC

## 2012-10-06 NOTE — Telephone Encounter (Signed)
No refill requests received from [facility].  Refills sent.  Pt due for follow-up with Dr. Audria Nine in early Feb

## 2012-10-06 NOTE — Telephone Encounter (Signed)
Lyrica, Furosemide, and Humalog 50/50 pens (4 boxes), needs refills on these. Please advise.

## 2012-10-06 NOTE — Telephone Encounter (Signed)
PATIENTS WIFE IS CALLING IN REGARDS TO A MISSED PHONE CALL, HE WAS TOLD TO HOLD ON BUT WASN'T ABLE TO SPEAK TO ANYONE WHEN WE HUNG UP ON HIM. PLEASE CALL BACK, PATIENT SAYS HE HAS RAN OUT OF RX FOR HIS LIVER AND HAS HAD [facility] SEND MULTIPLE FAXES. THANK YOU!

## 2012-10-06 NOTE — Telephone Encounter (Signed)
Patient's wife has called back to check on status of the refill. She said [facility] has also sent an escript and several faxes so she doesn't know why it's taking so long. He needs the lyrica and lasix refilled

## 2012-10-06 NOTE — Telephone Encounter (Signed)
Pt notified that rx's have been sent in 

## 2012-10-12 DIAGNOSIS — M961 Postlaminectomy syndrome, not elsewhere classified: Secondary | ICD-10-CM | POA: Diagnosis not present

## 2012-10-12 DIAGNOSIS — G541 Lumbosacral plexus disorders: Secondary | ICD-10-CM | POA: Diagnosis not present

## 2012-10-12 DIAGNOSIS — H81399 Other peripheral vertigo, unspecified ear: Secondary | ICD-10-CM | POA: Diagnosis not present

## 2012-10-12 DIAGNOSIS — M533 Sacrococcygeal disorders, not elsewhere classified: Secondary | ICD-10-CM | POA: Diagnosis not present

## 2012-10-14 ENCOUNTER — Other Ambulatory Visit: Payer: Self-pay | Admitting: Radiology

## 2012-10-20 ENCOUNTER — Ambulatory Visit (INDEPENDENT_AMBULATORY_CARE_PROVIDER_SITE_OTHER): Payer: Medicare Other | Admitting: Family Medicine

## 2012-10-20 ENCOUNTER — Encounter: Payer: Self-pay | Admitting: Family Medicine

## 2012-10-20 VITALS — BP 130/58 | HR 66 | Temp 98.2°F | Resp 16 | Ht 71.0 in | Wt 287.2 lb

## 2012-10-20 DIAGNOSIS — M898X9 Other specified disorders of bone, unspecified site: Secondary | ICD-10-CM

## 2012-10-20 DIAGNOSIS — R252 Cramp and spasm: Secondary | ICD-10-CM

## 2012-10-20 DIAGNOSIS — J4489 Other specified chronic obstructive pulmonary disease: Secondary | ICD-10-CM

## 2012-10-20 DIAGNOSIS — M949 Disorder of cartilage, unspecified: Secondary | ICD-10-CM | POA: Diagnosis not present

## 2012-10-20 DIAGNOSIS — R609 Edema, unspecified: Secondary | ICD-10-CM | POA: Diagnosis not present

## 2012-10-20 DIAGNOSIS — R600 Localized edema: Secondary | ICD-10-CM

## 2012-10-20 DIAGNOSIS — M899 Disorder of bone, unspecified: Secondary | ICD-10-CM | POA: Diagnosis not present

## 2012-10-20 DIAGNOSIS — IMO0001 Reserved for inherently not codable concepts without codable children: Secondary | ICD-10-CM

## 2012-10-20 DIAGNOSIS — J449 Chronic obstructive pulmonary disease, unspecified: Secondary | ICD-10-CM

## 2012-10-20 LAB — BASIC METABOLIC PANEL
BUN: 9 mg/dL (ref 6–23)
CO2: 26 mEq/L (ref 19–32)
Calcium: 9.3 mg/dL (ref 8.4–10.5)
Glucose, Bld: 231 mg/dL — ABNORMAL HIGH (ref 70–99)

## 2012-10-20 MED ORDER — LIRAGLUTIDE 18 MG/3ML ~~LOC~~ SOLN: 0.6000 mg | Freq: Every day | SUBCUTANEOUS | Status: DC
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
## 2012-10-21 ENCOUNTER — Other Ambulatory Visit: Payer: Self-pay | Admitting: Family Medicine

## 2012-10-21 LAB — VITAMIN D 25 HYDROXY (VIT D DEFICIENCY, FRACTURES): Vit D, 25-Hydroxy: 24 ng/mL — ABNORMAL LOW (ref 30–89)

## 2012-10-21 MED ORDER — ERGOCALCIFEROL 1.25 MG (50000 UT) PO CAPS: 50000.0000 [IU] | ORAL_CAPSULE | ORAL | Status: DC

## 2012-10-21 NOTE — Progress Notes (Signed)
Quick Note:  Please call pt and advise that the following labs are abnormal...  Blood sugar is high (no surprise). Kidney function is good. Magnesium level is normal.   Vitamin D is below normal; I am prescribing Vitamin D 11914 units 1 capsule to be taken once a week. There are multiple refills on this medication.  Copy to pt. ______

## 2012-10-22 DIAGNOSIS — R05 Cough: Secondary | ICD-10-CM | POA: Diagnosis not present

## 2012-10-22 DIAGNOSIS — R0602 Shortness of breath: Secondary | ICD-10-CM | POA: Diagnosis not present

## 2012-10-22 DIAGNOSIS — J45901 Unspecified asthma with (acute) exacerbation: Secondary | ICD-10-CM | POA: Diagnosis not present

## 2012-10-22 DIAGNOSIS — R404 Transient alteration of awareness: Secondary | ICD-10-CM | POA: Diagnosis not present

## 2012-10-22 DIAGNOSIS — J984 Other disorders of lung: Secondary | ICD-10-CM | POA: Diagnosis not present

## 2012-10-22 DIAGNOSIS — R1310 Dysphagia, unspecified: Secondary | ICD-10-CM | POA: Diagnosis not present

## 2012-10-27 ENCOUNTER — Other Ambulatory Visit: Payer: Self-pay | Admitting: *Deleted

## 2012-10-27 MED ORDER — GLUCOSE BLOOD VI STRP: ORAL_STRIP | Status: DC

## 2012-10-27 NOTE — Telephone Encounter (Signed)
Error

## 2012-11-01 ENCOUNTER — Ambulatory Visit: Payer: Medicare Other | Admitting: Internal Medicine

## 2012-11-04 ENCOUNTER — Ambulatory Visit (INDEPENDENT_AMBULATORY_CARE_PROVIDER_SITE_OTHER): Payer: Medicare Other | Admitting: Family Medicine

## 2012-11-04 VITALS — BP 153/69 | HR 101 | Temp 98.1°F | Wt 270.0 lb

## 2012-11-04 DIAGNOSIS — N12 Tubulo-interstitial nephritis, not specified as acute or chronic: Secondary | ICD-10-CM

## 2012-11-04 DIAGNOSIS — IMO0001 Reserved for inherently not codable concepts without codable children: Secondary | ICD-10-CM | POA: Diagnosis not present

## 2012-11-04 DIAGNOSIS — R509 Fever, unspecified: Secondary | ICD-10-CM | POA: Diagnosis not present

## 2012-11-04 DIAGNOSIS — R300 Dysuria: Secondary | ICD-10-CM

## 2012-11-04 DIAGNOSIS — N390 Urinary tract infection, site not specified: Secondary | ICD-10-CM

## 2012-11-04 LAB — POCT UA - MICROSCOPIC ONLY
Casts, Ur, LPF, POC: NEGATIVE
Crystals, Ur, HPF, POC: NEGATIVE
Mucus, UA: NEGATIVE
Yeast, UA: NEGATIVE

## 2012-11-04 LAB — POCT URINALYSIS DIPSTICK
Bilirubin, UA: NEGATIVE
Glucose, UA: >=1000
Ketones, UA: NEGATIVE
Nitrite, UA: POSITIVE
Protein, UA: 100
Spec Grav, UA: 1.01
Urobilinogen, UA: 2
pH, UA: 5.5

## 2012-11-04 LAB — POCT CBC
Granulocyte percent: 45.8 %G (ref 37–80)
HCT, POC: 37.4 % — AB (ref 43.5–53.7)
Hemoglobin: 11.8 g/dL — AB (ref 14.1–18.1)
MCV: 101.1 fL — AB (ref 80–97)
POC Granulocyte: 3.4 (ref 2–6.9)
POC LYMPH PERCENT: 47.3 %L (ref 10–50)
RBC: 3.7 M/uL — AB (ref 4.69–6.13)
RDW, POC: 15.9 %

## 2012-11-04 LAB — COMPREHENSIVE METABOLIC PANEL
ALT: 18 U/L (ref 0–53)
Albumin: 4.1 g/dL (ref 3.5–5.2)
CO2: 27 mEq/L (ref 19–32)
Calcium: 10.1 mg/dL (ref 8.4–10.5)
Chloride: 101 mEq/L (ref 96–112)
Glucose, Bld: 302 mg/dL — ABNORMAL HIGH (ref 70–99)
Potassium: 4.5 mEq/L (ref 3.5–5.3)
Sodium: 136 mEq/L (ref 135–145)
Total Bilirubin: 0.4 mg/dL (ref 0.3–1.2)
Total Protein: 6.7 g/dL (ref 6.0–8.3)

## 2012-11-04 LAB — GLUCOSE, POCT (MANUAL RESULT ENTRY): POC Glucose: 330 mg/dl — AB (ref 70–99)

## 2012-11-04 MED ORDER — LEVOFLOXACIN 750 MG PO TABS: 750.0000 mg | ORAL_TABLET | Freq: Every day | ORAL | Status: DC

## 2012-11-04 MED ORDER — CEFTRIAXONE SODIUM 1 G IJ SOLR
1.0000 g | INTRAMUSCULAR | Status: DC
  Administered 2012-11-04: 1 g via INTRAMUSCULAR

## 2012-11-04 NOTE — Patient Instructions (Addendum)
Continue to drink a lot of water.  Avoid starches and take your diabetes medicines faithfully to try and get the blood sugars coming down.  In the event of worsening abdominal pain, or more nausea or vomiting or high fevers return or go to the emergency room.  Stop habit of drinking Coca-Cola

## 2012-11-04 NOTE — Progress Notes (Signed)
Subjective: 62 year old man who has been ill for several days with urinary discomfort. He has developed pain in his low back. He's been running a fever. He has had some nausea and a little bit of vomit. He had stopped the victoza because of concern of pancreatitis that he had heard about television. His diabetes has been very poorly controlled. He lives on Coca-Cola. He hurts in his low abdomen  Objective: Overweight male who looks like he does not feel well. Chest clear. Heart regular without murmurs. Abdomen has bowel sounds present. Is soft. Tender across the low abdomen. Bilateral CVA tenderness.   Results for orders placed in visit on 11/04/12  POCT URINALYSIS DIPSTICK      Result Value Range   Color, UA amber     Clarity, UA cloudy     Glucose, UA >=1000     Bilirubin, UA neg     Ketones, UA neg     Spec Grav, UA 1.010     Blood, UA large     pH, UA 5.5     Protein, UA 100     Urobilinogen, UA 2.0     Nitrite, UA positive     Leukocytes, UA Trace    POCT UA - MICROSCOPIC ONLY      Result Value Range   WBC, Ur, HPF, POC TNTC     RBC, urine, microscopic TNTC     Bacteria, U Microscopic trace     Mucus, UA neg     Epithelial cells, urine per micros 0-1     Crystals, Ur, HPF, POC neg     Casts, Ur, LPF, POC neg     Yeast, UA neg    POCT CBC      Result Value Range   WBC 7.5  4.6 - 10.2 K/uL   Lymph, poc 3.5 (*) 0.6 - 3.4   POC LYMPH PERCENT 47.3  10 - 50 %L   MID (cbc) 0.5  0 - 0.9   POC MID % 6.9  0 - 12 %M   POC Granulocyte 3.4  2 - 6.9   Granulocyte percent 45.8  37 - 80 %G   RBC 3.70 (*) 4.69 - 6.13 M/uL   Hemoglobin 11.8 (*) 14.1 - 18.1 g/dL   HCT, POC 54.0 (*) 98.1 - 53.7 %   MCV 101.1 (*) 80 - 97 fL   MCH, POC 31.9 (*) 27 - 31.2 pg   MCHC 31.6 (*) 31.8 - 35.4 g/dL   RDW, POC 19.1     Platelet Count, POC 421  142 - 424 K/uL   MPV 8.4  0 - 99.8 fL  GLUCOSE, POCT (MANUAL RESULT ENTRY)      Result Value Range   POC Glucose 330 (*) 70 - 99 mg/dl    Assessment: Acute pyelonephritis Uncontrolled diabetes mellitus Back pain Nausea   Plan: Ceftriaxone 1 g IM Levaquin 750 mg daily for one week Continue his diabetes medicine. Watch his diet. Advised to avoid cokes.  Plan:

## 2012-11-06 ENCOUNTER — Encounter: Payer: Self-pay | Admitting: Family Medicine

## 2012-11-07 ENCOUNTER — Encounter: Payer: Self-pay | Admitting: Family Medicine

## 2012-11-07 LAB — URINE CULTURE: Colony Count: >=100000

## 2012-11-09 DIAGNOSIS — G541 Lumbosacral plexus disorders: Secondary | ICD-10-CM | POA: Diagnosis not present

## 2012-11-09 DIAGNOSIS — M533 Sacrococcygeal disorders, not elsewhere classified: Secondary | ICD-10-CM | POA: Diagnosis not present

## 2012-11-09 DIAGNOSIS — H8190 Unspecified disorder of vestibular function, unspecified ear: Secondary | ICD-10-CM | POA: Diagnosis not present

## 2012-11-09 DIAGNOSIS — M961 Postlaminectomy syndrome, not elsewhere classified: Secondary | ICD-10-CM | POA: Diagnosis not present

## 2012-11-09 DIAGNOSIS — H814 Vertigo of central origin: Secondary | ICD-10-CM | POA: Diagnosis not present

## 2012-11-09 DIAGNOSIS — H81399 Other peripheral vertigo, unspecified ear: Secondary | ICD-10-CM | POA: Diagnosis not present

## 2012-11-09 DIAGNOSIS — Z79899 Other long term (current) drug therapy: Secondary | ICD-10-CM | POA: Diagnosis not present

## 2012-11-09 DIAGNOSIS — M542 Cervicalgia: Secondary | ICD-10-CM | POA: Diagnosis not present

## 2012-11-09 DIAGNOSIS — H905 Unspecified sensorineural hearing loss: Secondary | ICD-10-CM | POA: Diagnosis not present

## 2012-11-11 ENCOUNTER — Telehealth: Payer: Self-pay

## 2012-11-11 NOTE — Telephone Encounter (Signed)
Patient wants Sertraline sent in by Dr Audria Nine he no longer goes to Dr Everardo All. Please advise. Pended Rx

## 2012-11-11 NOTE — Telephone Encounter (Signed)
Patient's wife is calling to refill patients medicine for a year  Pharmacy: [facility] [address]: [phone]

## 2012-11-11 NOTE — Telephone Encounter (Signed)
Which med(s) need refills?

## 2012-11-12 MED ORDER — SERTRALINE HCL 100 MG PO TABS: 100.0000 mg | ORAL_TABLET | Freq: Every day | ORAL | Status: DC

## 2012-11-12 NOTE — Telephone Encounter (Signed)
Sent in 1 3 month Rx because he will need to be seen for his DM every 3 months and at his next appt they can discuss whether a 1 yr Rx if appropriate.

## 2012-11-12 NOTE — Telephone Encounter (Signed)
Left message to advise patient due for follow up.

## 2012-11-16 ENCOUNTER — Telehealth: Payer: Self-pay | Admitting: Radiology

## 2012-11-16 ENCOUNTER — Other Ambulatory Visit: Payer: Self-pay | Admitting: Family Medicine

## 2012-11-16 NOTE — Telephone Encounter (Signed)
If you look in Chart Review under "CV procedure", Future Order for Lower Ext Arterial Study Left is listed.

## 2012-11-16 NOTE — Telephone Encounter (Signed)
Lower ext arterial study not done, [facility] calling it is showing in their work que but not in ours, please advise do you want to reorder this?

## 2012-11-16 NOTE — Telephone Encounter (Signed)
Please get study done.

## 2012-11-26 ENCOUNTER — Other Ambulatory Visit: Payer: Self-pay | Admitting: Radiology

## 2012-12-07 DIAGNOSIS — H8190 Unspecified disorder of vestibular function, unspecified ear: Secondary | ICD-10-CM | POA: Diagnosis not present

## 2012-12-07 DIAGNOSIS — H814 Vertigo of central origin: Secondary | ICD-10-CM | POA: Diagnosis not present

## 2012-12-07 DIAGNOSIS — H81399 Other peripheral vertigo, unspecified ear: Secondary | ICD-10-CM | POA: Diagnosis not present

## 2012-12-07 DIAGNOSIS — G541 Lumbosacral plexus disorders: Secondary | ICD-10-CM | POA: Diagnosis not present

## 2012-12-13 ENCOUNTER — Encounter: Payer: Self-pay | Admitting: Cardiovascular Disease

## 2012-12-13 ENCOUNTER — Ambulatory Visit (INDEPENDENT_AMBULATORY_CARE_PROVIDER_SITE_OTHER): Payer: Medicare Other | Admitting: Cardiovascular Disease

## 2012-12-13 VITALS — BP 122/67 | HR 72 | Ht 74.0 in | Wt 284.0 lb

## 2012-12-13 DIAGNOSIS — I2510 Atherosclerotic heart disease of native coronary artery without angina pectoris: Secondary | ICD-10-CM | POA: Diagnosis not present

## 2012-12-13 DIAGNOSIS — R0602 Shortness of breath: Secondary | ICD-10-CM

## 2012-12-13 MED ORDER — FUROSEMIDE 40 MG PO TABS: 40.0000 mg | ORAL_TABLET | Freq: Every day | ORAL | Status: DC
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
## 2012-12-13 NOTE — Patient Instructions (Addendum)
Your physician has requested that you have an echocardiogram. Echocardiography is a painless test that uses sound waves to create images of your heart. It provides your doctor with information about the size and shape of your heart and how well your heart's chambers and valves are working. This procedure takes approximately one hour. There are no restrictions for this procedure.  Your physician wants you to follow-up in: 1 YEAR with Dr Excell Seltzer.  You will receive a reminder letter in the mail two months in advance. If you don't receive a letter, please call our office to schedule the follow-up appointment.  Your physician recommends that you continue on your current medications as directed. Please refer to the Current Medication list given to you today. Restart Furosemide

## 2012-12-20 ENCOUNTER — Ambulatory Visit (HOSPITAL_COMMUNITY): Payer: Medicare Other | Attending: Cardiovascular Disease | Admitting: Radiology

## 2012-12-20 DIAGNOSIS — I2510 Atherosclerotic heart disease of native coronary artery without angina pectoris: Secondary | ICD-10-CM

## 2012-12-20 DIAGNOSIS — R0609 Other forms of dyspnea: Secondary | ICD-10-CM | POA: Diagnosis not present

## 2012-12-20 DIAGNOSIS — R0602 Shortness of breath: Secondary | ICD-10-CM | POA: Diagnosis not present

## 2012-12-20 DIAGNOSIS — R0989 Other specified symptoms and signs involving the circulatory and respiratory systems: Secondary | ICD-10-CM | POA: Diagnosis not present

## 2012-12-20 NOTE — Progress Notes (Signed)
Echocardiogram performed.  

## 2012-12-21 DIAGNOSIS — E119 Type 2 diabetes mellitus without complications: Secondary | ICD-10-CM | POA: Diagnosis not present

## 2012-12-21 DIAGNOSIS — E11319 Type 2 diabetes mellitus with unspecified diabetic retinopathy without macular edema: Secondary | ICD-10-CM | POA: Diagnosis not present

## 2012-12-21 DIAGNOSIS — Z961 Presence of intraocular lens: Secondary | ICD-10-CM | POA: Diagnosis not present

## 2012-12-22 ENCOUNTER — Telehealth: Payer: Self-pay | Admitting: Cardiovascular Disease

## 2012-12-22 NOTE — Telephone Encounter (Signed)
New problem:  Test results.  

## 2012-12-22 NOTE — Telephone Encounter (Signed)
Pt notified of results

## 2012-12-31 ENCOUNTER — Telehealth: Payer: Self-pay

## 2012-12-31 MED ORDER — PREGABALIN 100 MG PO CAPS: 100.0000 mg | ORAL_CAPSULE | Freq: Three times a day (TID) | ORAL | Status: DC

## 2012-12-31 NOTE — Telephone Encounter (Signed)
Sent!

## 2012-12-31 NOTE — Telephone Encounter (Signed)
[name] STATES SHE WORK AT THE PHARMACY AND KNOW THEY FAXED HER HUSBAND'S LYRICA TO BE REFILLED AND HE IS OUT, HE NEED TODAY WANTED [place] TO KNOW SHE WORKS AT THE PHARMACY AND KNOW HOW THINGS WORK HERE. PLEASE CALL [phone]     [facility] ON [address]

## 2012-12-31 NOTE — Telephone Encounter (Signed)
Spoke to [facility], 11/10/12 was the last fill date, there are no refills left, please advise on renewal/ pended Rx

## 2012-12-31 NOTE — Telephone Encounter (Signed)
Called [facility] to cancel, then can call to [facility]. Had to leave message with [facility] to call me back

## 2013-01-01 ENCOUNTER — Other Ambulatory Visit: Payer: Self-pay | Admitting: Endocrinology

## 2013-01-03 ENCOUNTER — Other Ambulatory Visit: Payer: Self-pay | Admitting: *Deleted

## 2013-01-03 MED ORDER — TRAZODONE HCL 100 MG PO TABS: 100.0000 mg | ORAL_TABLET | Freq: Every day | ORAL | Status: DC

## 2013-01-10 ENCOUNTER — Other Ambulatory Visit: Payer: Self-pay | Admitting: Physician Assistant

## 2013-01-19 ENCOUNTER — Ambulatory Visit (INDEPENDENT_AMBULATORY_CARE_PROVIDER_SITE_OTHER): Payer: Medicare Other | Admitting: Family Medicine

## 2013-01-19 ENCOUNTER — Encounter: Payer: Self-pay | Admitting: Family Medicine

## 2013-01-19 VITALS — BP 130/68 | HR 133 | Temp 98.7°F | Resp 20 | Ht 71.0 in | Wt 286.0 lb

## 2013-01-19 DIAGNOSIS — Z87442 Personal history of urinary calculi: Secondary | ICD-10-CM

## 2013-01-19 DIAGNOSIS — Z760 Encounter for issue of repeat prescription: Secondary | ICD-10-CM

## 2013-01-19 DIAGNOSIS — R319 Hematuria, unspecified: Secondary | ICD-10-CM | POA: Diagnosis not present

## 2013-01-19 LAB — POCT GLYCOSYLATED HEMOGLOBIN (HGB A1C): Hemoglobin A1C: 8.2

## 2013-01-19 LAB — POCT URINALYSIS DIPSTICK
Ketones, UA: NEGATIVE
Leukocytes, UA: NEGATIVE
Nitrite, UA: NEGATIVE
Protein, UA: NEGATIVE
pH, UA: 5.5

## 2013-01-19 MED ORDER — ESOMEPRAZOLE MAGNESIUM 40 MG PO CPDR: 40.0000 mg | DELAYED_RELEASE_CAPSULE | Freq: Two times a day (BID) | ORAL | Status: DC

## 2013-01-19 MED ORDER — TRAZODONE HCL 100 MG PO TABS: 100.0000 mg | ORAL_TABLET | Freq: Every day | ORAL | Status: DC

## 2013-01-19 MED ORDER — PREGABALIN 100 MG PO CAPS: 100.0000 mg | ORAL_CAPSULE | Freq: Three times a day (TID) | ORAL | Status: DC

## 2013-01-19 MED ORDER — SERTRALINE HCL 100 MG PO TABS: 100.0000 mg | ORAL_TABLET | Freq: Every day | ORAL | Status: DC

## 2013-01-19 NOTE — Progress Notes (Signed)
S:  This 62 y.o. Cauc male has Type II DM, not well controlled but better since starting Victoza (FSBS below 200 most of the time). Pt has been drinking mostly water and reduced colas since UTI in Feb 2014. Pt denies hypoglycemia, vision changes, diaphoresis, CP or tightness, palpitations, SOB, cough, wheezing, abd pain or GI problems, rashes or erythema, numbness or weakness or syncope.   He had episode yesterday of L flank pain radiating to L groin. He awoke this AM, pain having resolved. Pt thinks he may have passed a kidney stone. Pt denies frank hematuria.  Pt continues to go to Pain Mgmt but not absolutely satisfied; considering re-establishing care at another practice. He is requesting RX for prosthetic sleeve for R lower limb.   PMHx, Soc Hx, Surg Hx and Problem List reviewed.  ROS: As per HPI.  O: Filed Vitals:   01/19/13 1029  BP: 130/68  Pulse: 133  Temp: 98.7 F (37.1 C)  Resp: 20    GEN: In NAD; WN,WD. HENT: [place]/AT; EOMI w/ clear conj/ sclerae. Pt wears corrective lenses. EACs/ nose/ oroph normal. COR: RRR. No edema. LUNGS: Normal resp rate and effort. SKIN: W&D. No erythema, rashes or pallor. MS: R BKA; wearing prothesis. Ambulates unassisted. NEURO: A&O x 3; CNs intact. Nonfocal.   Results for orders placed in visit on 01/19/13  POCT GLYCOSYLATED HEMOGLOBIN (HGB A1C)      Result Value Range   Hemoglobin A1C 8.2    POCT URINALYSIS DIPSTICK      Result Value Range   Color, UA yellow     Clarity, UA clear     Glucose, UA >=1000mg /dl     Bilirubin, UA neg     Ketones, UA neg     Spec Grav, UA <=1.005     Blood, UA trace     pH, UA 5.5     Protein, UA neg     Urobilinogen, UA 2.0     Nitrite, UA neg     Leukocytes, UA Negative       A/P: Type II or unspecified type diabetes mellitus without mention of complication, uncontrolled -    Plan: Discussed medication adjustment (Victoza dose increased to 1.2 mg = 0.2 ml daily); continue FSBS at least 2x daily.  Nutrition adjustments as discussed.  Hematuria - trace of blood today in urine but pt asymptomatic.  Plan: Push po fluids esp water and eliminate colas.  History of kidney stones  Prescription refill  Meds ordered this encounter  Medications  . esomeprazole (NEXIUM) 40 MG capsule    Sig: Take 1 capsule (40 mg total) by mouth 2 (two) times daily.    Dispense:  60 capsule    Refill:  3  . sertraline (ZOLOFT) 100 MG tablet    Sig: Take 1 tablet (100 mg total) by mouth daily.    Dispense:  30 tablet    Refill:  3  . traZODone (DESYREL) 100 MG tablet    Sig: Take 1 tablet (100 mg total) by mouth at bedtime.    Dispense:  30 tablet    Refill:  3  . pregabalin (LYRICA) 100 MG capsule    Sig: Take 1 capsule (100 mg total) by mouth 3 (three) times daily.    Dispense:  90 capsule    Refill:  3

## 2013-01-19 NOTE — Patient Instructions (Addendum)
Diabetes is not controlled. Increase Victoza to 1.2 mg (0.2 ml) daily; this is double the current dose that you are taking. Continue to check blood sugars and monitor nutrition. Stay off the sodas. Return to  Clinic in 3 months.

## 2013-01-20 ENCOUNTER — Encounter: Payer: Self-pay | Admitting: Family Medicine

## 2013-01-21 DIAGNOSIS — H8190 Unspecified disorder of vestibular function, unspecified ear: Secondary | ICD-10-CM | POA: Diagnosis not present

## 2013-01-21 DIAGNOSIS — G541 Lumbosacral plexus disorders: Secondary | ICD-10-CM | POA: Diagnosis not present

## 2013-01-21 DIAGNOSIS — H814 Vertigo of central origin: Secondary | ICD-10-CM | POA: Diagnosis not present

## 2013-01-21 DIAGNOSIS — H81399 Other peripheral vertigo, unspecified ear: Secondary | ICD-10-CM | POA: Diagnosis not present

## 2013-02-02 ENCOUNTER — Other Ambulatory Visit: Payer: Self-pay

## 2013-02-08 ENCOUNTER — Other Ambulatory Visit: Payer: Self-pay | Admitting: Physician Assistant

## 2013-02-18 DIAGNOSIS — Z79899 Other long term (current) drug therapy: Secondary | ICD-10-CM | POA: Diagnosis not present

## 2013-02-18 DIAGNOSIS — M542 Cervicalgia: Secondary | ICD-10-CM | POA: Diagnosis not present

## 2013-02-18 DIAGNOSIS — G541 Lumbosacral plexus disorders: Secondary | ICD-10-CM | POA: Diagnosis not present

## 2013-02-18 DIAGNOSIS — H8190 Unspecified disorder of vestibular function, unspecified ear: Secondary | ICD-10-CM | POA: Diagnosis not present

## 2013-02-18 DIAGNOSIS — H81399 Other peripheral vertigo, unspecified ear: Secondary | ICD-10-CM | POA: Diagnosis not present

## 2013-02-18 DIAGNOSIS — H814 Vertigo of central origin: Secondary | ICD-10-CM | POA: Diagnosis not present

## 2013-03-14 DIAGNOSIS — H81399 Other peripheral vertigo, unspecified ear: Secondary | ICD-10-CM | POA: Diagnosis not present

## 2013-03-14 DIAGNOSIS — H8190 Unspecified disorder of vestibular function, unspecified ear: Secondary | ICD-10-CM | POA: Diagnosis not present

## 2013-03-14 DIAGNOSIS — G541 Lumbosacral plexus disorders: Secondary | ICD-10-CM | POA: Diagnosis not present

## 2013-03-14 DIAGNOSIS — Z79899 Other long term (current) drug therapy: Secondary | ICD-10-CM | POA: Diagnosis not present

## 2013-03-14 DIAGNOSIS — H814 Vertigo of central origin: Secondary | ICD-10-CM | POA: Diagnosis not present

## 2013-03-20 ENCOUNTER — Other Ambulatory Visit: Payer: Self-pay | Admitting: Endocrinology

## 2013-04-11 DIAGNOSIS — H814 Vertigo of central origin: Secondary | ICD-10-CM | POA: Diagnosis not present

## 2013-04-11 DIAGNOSIS — Z79899 Other long term (current) drug therapy: Secondary | ICD-10-CM | POA: Diagnosis not present

## 2013-04-11 DIAGNOSIS — M545 Low back pain: Secondary | ICD-10-CM | POA: Diagnosis not present

## 2013-04-11 DIAGNOSIS — H81399 Other peripheral vertigo, unspecified ear: Secondary | ICD-10-CM | POA: Diagnosis not present

## 2013-04-11 DIAGNOSIS — G541 Lumbosacral plexus disorders: Secondary | ICD-10-CM | POA: Diagnosis not present

## 2013-04-11 DIAGNOSIS — H8190 Unspecified disorder of vestibular function, unspecified ear: Secondary | ICD-10-CM | POA: Diagnosis not present

## 2013-04-20 ENCOUNTER — Encounter: Payer: Self-pay | Admitting: Family Medicine

## 2013-04-20 ENCOUNTER — Ambulatory Visit (INDEPENDENT_AMBULATORY_CARE_PROVIDER_SITE_OTHER): Payer: Medicare Other | Admitting: Family Medicine

## 2013-04-20 VITALS — BP 142/68 | HR 81 | Temp 99.2°F | Resp 18 | Ht 73.0 in | Wt 292.8 lb

## 2013-04-20 DIAGNOSIS — G894 Chronic pain syndrome: Secondary | ICD-10-CM

## 2013-04-20 DIAGNOSIS — K219 Gastro-esophageal reflux disease without esophagitis: Secondary | ICD-10-CM | POA: Diagnosis not present

## 2013-04-20 LAB — POCT GLYCOSYLATED HEMOGLOBIN (HGB A1C): Hemoglobin A1C: 8.7

## 2013-04-20 MED ORDER — TRAZODONE HCL 100 MG PO TABS: 100.0000 mg | ORAL_TABLET | Freq: Every day | ORAL | Status: DC

## 2013-04-20 MED ORDER — ZOLPIDEM TARTRATE 10 MG PO TABS: 10.0000 mg | ORAL_TABLET | Freq: Every evening | ORAL | Status: DC | PRN

## 2013-04-20 MED ORDER — TRAMADOL HCL 50 MG PO TABS: 50.0000 mg | ORAL_TABLET | Freq: Three times a day (TID) | ORAL | Status: DC | PRN

## 2013-04-20 MED ORDER — ESOMEPRAZOLE MAGNESIUM 40 MG PO CPDR: 40.0000 mg | DELAYED_RELEASE_CAPSULE | Freq: Two times a day (BID) | ORAL | Status: DC

## 2013-04-20 MED ORDER — SERTRALINE HCL 100 MG PO TABS: 100.0000 mg | ORAL_TABLET | Freq: Every day | ORAL | Status: DC

## 2013-04-20 MED ORDER — INSULIN GLULISINE 100 UNIT/ML IJ SOLN: 10.0000 [IU] | Freq: Three times a day (TID) | INTRAMUSCULAR | Status: DC

## 2013-04-20 MED ORDER — METOCLOPRAMIDE HCL 10 MG PO TABS: ORAL_TABLET | ORAL | Status: DC

## 2013-04-20 NOTE — Patient Instructions (Addendum)
Diabetes- New Insulin is Apidra (generic)- you will start by using 10 units immediately before main meals.   I have prescribed Tramadol 50 mg 1 tablet every 8 hours as needed for pain. You will take this medication alternating with Oxycodone as you attempt to wean off oxycodone.

## 2013-04-20 NOTE — Progress Notes (Signed)
S:  This 62 y.o. Cauc male has uncontrolled DM; he reports FSBS ranging from 50->400. Nutrition- not following a meal plan, drinks Cokes and snacks on peanut crackers, etc. His dentition and chronic severe GERD prohibit many foods. Pt no longer using Victoza- no benefit. Reports "A1c always hovers around 8". Activity level limited due to prosthetic R lower leg; currently having a new sleeve made. Current material on sleeve is causing a chafing red rash; pt has prescription cream to resolve rash.  Pt has chronic pain syndrome and is followed by [facility]; he is very displeased about cost of care there. He cannot afford to continue monthly visits and plans to wean off narcotic and/or seek care at a different pain clinic.   PMHx, Soc Hx and Problem List reviewed.  Medications reconciled.   ROS: As per HPI; GERD symptoms persists w/ occasional regurgitation and multiple food intolerances. Negative for diaphoresis, abnormal weight loss or gain, CP or tightness, palpitations, SOB or DOE, cough, hematochezia or diarrhea, myalgias or muscle cramps, AH, dizziness, tremor, numbness, tremor or syncope.    O: Filed Vitals:   04/20/13 1041  BP: 142/68  Pulse: 81  Temp: 99.2 F (37.3 C)  Resp: 18   GEN: In NAD; WN,WD. HENT: [place]/AT; EOMI w/ clear conj/sclerae. Poor dentition. Otherwise unremarkable. COR: RRR. LUNGS: CTA. Normal resp rate and effort. MS: R lower ext- prosthetic in place. Chafing evident on medial aspect of distal thigh. No sign of infection. NEURO: A&O x 3; CNs intact. Nonfocal. PSYCH: Pt mildly agitated when discussing Pain Clinic visits.   Results for orders placed in visit on 04/20/13  POCT GLYCOSYLATED HEMOGLOBIN (HGB A1C)      Result Value Range   Hemoglobin A1C 8.7      A/P: Type II or unspecified type diabetes mellitus without mention of complication, uncontrolled - Trial Apidra 10 units immediately before meals. Try to improve nutrition; poor dentition  prohibits optimum food choices.  Plan: POCT glycosylated hemoglobin (Hb A1C), Basic metabolic panel  Chronic pain syndrome- Trial Tramadol 50 mg 1 tab alternating with Oxycodone between doses of Oxycontin.  Pt's goal is to wean off narcotics by the end of the year. If he is not able to discontinue narcotics, he agrees to be referred to a different pain management clinic.  Esophageal reflux- COntinue current medications; pt to schedule follow-up with GI.  Meds ordered this encounter  Medications  . metoCLOPramide (REGLAN) 10 MG tablet    Sig: TAKE 1 TABLET BY MOUTH THREE TIMES DAILY BEFORE A MEAL    Dispense:  270 tablet    Refill:  1  . sertraline (ZOLOFT) 100 MG tablet    Sig: Take 1 tablet (100 mg total) by mouth daily.    Dispense:  90 tablet    Refill:  3  . traZODone (DESYREL) 100 MG tablet    Sig: Take 1 tablet (100 mg total) by mouth at bedtime.    Dispense:  90 tablet    Refill:  3  . zolpidem (AMBIEN) 10 MG tablet    Sig: Take 1 tablet (10 mg total) by mouth at bedtime as needed for sleep.    Dispense:  30 tablet    Refill:  3  . esomeprazole (NEXIUM) 40 MG capsule    Sig: Take 1 capsule (40 mg total) by mouth 2 (two) times daily.    Dispense:  180 capsule    Refill:  3  . traMADol (ULTRAM) 50 MG tablet  Sig: Take 1 tablet (50 mg total) by mouth every 8 (eight) hours as needed for pain.    Dispense:  100 tablet    Refill:  0  . insulin glulisine (APIDRA) 100 UNIT/ML injection    Sig: Inject 10 Units into the skin 3 (three) times daily before meals.    Dispense:  10 mL    Refill:  12

## 2013-04-21 LAB — BASIC METABOLIC PANEL
CO2: 24 mEq/L (ref 19–32)
Calcium: 9.3 mg/dL (ref 8.4–10.5)
Glucose, Bld: 282 mg/dL — ABNORMAL HIGH (ref 70–99)
Sodium: 135 mEq/L (ref 135–145)

## 2013-04-25 NOTE — Progress Notes (Signed)
Quick Note:  Please advise pt that the following labs are abnormal...  Blood sugar is very elevated (as expected) but electrolytes and kidney function are normal and stable.  Copy to pt.  ______

## 2013-05-03 ENCOUNTER — Other Ambulatory Visit: Payer: Self-pay | Admitting: Family Medicine

## 2013-05-04 NOTE — Telephone Encounter (Signed)
Dr Audria Nine, you just started pt on Apidra, do you also want him using the Humalog Kwikpen 50/50?

## 2013-05-09 ENCOUNTER — Telehealth: Payer: Self-pay

## 2013-05-09 ENCOUNTER — Other Ambulatory Visit: Payer: Self-pay | Admitting: Family Medicine

## 2013-05-09 NOTE — Telephone Encounter (Addendum)
PT WOULD LIKE TO SPEAK WITH SOMEONE REGARDING HIS MEDICINE. PLEASE CALL [phone] IT'S REGARDING HIS INSULIN

## 2013-05-10 ENCOUNTER — Telehealth: Payer: Self-pay | Admitting: Radiology

## 2013-05-10 MED ORDER — INSULIN LISPRO PROT & LISPRO (50-50 MIX) 100 UNIT/ML KWIKPEN: 75.0000 [IU] | PEN_INJECTOR | Freq: Two times a day (BID) | SUBCUTANEOUS | Status: DC

## 2013-05-10 NOTE — Telephone Encounter (Signed)
I called him about this, he is asking about the humalog, he is out message sent to Dr Audria Nine, to advise. Patient has been taking this with the Apidra

## 2013-05-10 NOTE — Telephone Encounter (Signed)
Thanks called pt to advise

## 2013-05-10 NOTE — Telephone Encounter (Signed)
Humalog refills have been routed to pharmacy.

## 2013-05-10 NOTE — Telephone Encounter (Signed)
Patient has been calling about his Humalog pens, he states you want him to use the pens AND the Apidra. Please advise. He is completley out of the Humalog pens. His phone # [phone]

## 2013-05-18 ENCOUNTER — Ambulatory Visit (INDEPENDENT_AMBULATORY_CARE_PROVIDER_SITE_OTHER): Payer: Medicare Other | Admitting: Family Medicine

## 2013-05-18 ENCOUNTER — Encounter: Payer: Self-pay | Admitting: Family Medicine

## 2013-05-18 ENCOUNTER — Ambulatory Visit: Payer: Medicare Other

## 2013-05-18 VITALS — BP 173/78 | HR 107 | Temp 98.1°F | Resp 20 | Ht 73.0 in | Wt 290.0 lb

## 2013-05-18 DIAGNOSIS — M546 Pain in thoracic spine: Secondary | ICD-10-CM | POA: Diagnosis not present

## 2013-05-18 DIAGNOSIS — K439 Ventral hernia without obstruction or gangrene: Secondary | ICD-10-CM | POA: Diagnosis not present

## 2013-05-18 DIAGNOSIS — G894 Chronic pain syndrome: Secondary | ICD-10-CM | POA: Diagnosis not present

## 2013-05-18 MED ORDER — OXYCODONE HCL ER 15 MG PO T12A: 15.0000 mg | EXTENDED_RELEASE_TABLET | Freq: Two times a day (BID) | ORAL | Status: DC

## 2013-05-18 NOTE — Progress Notes (Signed)
S: This 62 y.o. Cauc male returns today to review chronic pain medications and attempts at reducing use. He takes Oxycodone 5 mg tabs only prn; med is prescribed 1 tab 4 times a day. He ahs onlt taken 26 tabs since last visit. He requests reduction of Oxycontin from 20 mg/tab to 10 mg/tab; this med is taken every 12 hours. Pt also has Tramadol for prn use.   New complaint today- RUQ pain, associated w/ mid T-spine pain. Coughing or sneezing increases pain. Pain is relieved somewhat by belching; narcotics do not affect pain. Pt denies fever/chills,  n/v, change in stool pattern or color, hematochezia, melena or hematuria. Pt has known lumbar DDD. He has fallen on 2 occasions but no trauma to ribs. Pt's wife reports that abdominal wall hernia has increased in size. Pt also c/o increased difficulty swallowing; he will contact GI specialist re: GERD.  PMHx, Soc Hx and Fam Hx reviewed. Medications reconciled.  ROS: As per HPI; otherwise noncontributory.   O: Filed Vitals:   05/18/13 1109  BP: 173/78  Pulse: 107  Temp: 98.1 F (36.7 C)  Resp: 20   GEN: In mild distress, increased discomfort w/ movement. HENT: [place]/AT; EOMI w/ clear conj/sclerae. Otherwise unremarkable. COR: RRR. LUNGS: Normal resp rate and effort. BACK: No CVAT. R rib margin is tender in axillary line. Mid T-spine tender to palpation. ABD: Distended with football-size midline defect in abd wall. Tender RUQ and lower quadrants. No other masses or rebound. SKIN: W&D; no lesions, rashes, erythema or cyanosis. NEURO: A&Ox 3; CNs intact. Nonfocal.   [facility] reading (PRIMARY) by  Dr. Audria Nine: Thoracic spine- Kyphosis w/ diffuse degenerative disc disease and osteopenia.   A/P: Chronic pain syndrome- Reduce Oxycontin to 15 mg per tab 1 tab every 12 hours. Continue Oxycodone 5 mg 1 tab every 12 hours betw/ doses of Oxycontin.  Thoracic spine pain - Plan: DG Thoracic Spine 2 View  Abdominal wall hernia - Plan: CT Abdomen W  Contrast   Meds ordered this encounter  Medications  .       Marland Kitchen OxyCODONE (OXYCONTIN) 15 mg T12A 12 hr tablet    Sig: Take 1 tablet (15 mg total) by mouth every 12 (twelve) hours.    Dispense:  60 tablet    Refill:  0    Do not fill before 05/22/2013.

## 2013-05-18 NOTE — Patient Instructions (Addendum)
Ventral Hernia A ventral hernia (also called an incisional hernia) is a hernia that occurs at the site of a previous surgical cut (incision) in the abdomen. The abdominal wall spans from your lower chest down to your pelvis. If the abdominal wall is weakened from a surgical incision, a hernia can occur. A hernia is a bulge of bowel or muscle tissue pushing out on the weakened part of the abdominal wall. Ventral hernias can get bigger from straining or lifting. Obese and older people are at higher risk for a ventral hernia. People who develop infections after surgery or require repeat incisions at the same site on the abdomen are also at increased risk. CAUSES  A ventral hernia occurs because of weakness in the abdominal wall at an incision site.  SYMPTOMS  Common symptoms include:  A visible bulge or lump on the abdominal wall.  Pain or tenderness around the lump.  Increased discomfort if you cough or make a sudden movement. If the hernia has blocked part of the intestine, a serious complication can occur (incarcerated or strangulated hernia). This can become a problem that requires emergency surgery because the blood flow to the blocked intestine may be cut off. Symptoms may include:  Feeling sick to your stomach (nauseous).  Throwing up (vomiting).  Stomach swelling (distention) or bloating.  Fever.  Rapid heartbeat. DIAGNOSIS  Your caregiver will take a medical history and perform a physical exam. Various tests may be ordered, such as:  Blood tests.  Urine tests.  Ultrasonography.  X-rays.  Computed tomography (CT). TREATMENT  Watchful waiting may be all that is needed for a smaller hernia that does not cause symptoms. Your caregiver may recommend the use of a supportive belt (truss) that helps to keep the abdominal wall intact. For larger hernias or those that cause pain, surgery to repair the hernia is usually recommended. If a hernia becomes strangulated, emergency surgery  needs to be done right away. HOME CARE INSTRUCTIONS  Avoid putting pressure or strain on the abdominal area.  Avoid heavy lifting.  Use good body positioning for physical tasks. Ask your caregiver about proper body positioning.  Use a supportive belt as directed by your caregiver.  Maintain a healthy weight.  Eat foods that are high in fiber, such as whole grains, fruits, and vegetables. Fiber helps prevent difficult bowel movements (constipation).  Drink enough fluids to keep your urine clear or pale yellow.  Follow up with your caregiver as directed. SEEK MEDICAL CARE IF:   Your hernia seems to be getting larger or more painful. SEEK IMMEDIATE MEDICAL CARE IF:   You have abdominal pain that is sudden and sharp.  Your pain becomes severe.  You have repeated vomiting.  You are sweating a lot.  You notice a rapid heartbeat.  You develop a fever. MAKE SURE YOU:   Understand these instructions.  Will watch your condition.  Will get help right away if you are not doing well or get worse. Document Released: 08/18/2012 Document Reviewed: 08/06/2012 [employer] Patient Information 2014 [employer].  

## 2013-05-24 ENCOUNTER — Ambulatory Visit
Admission: RE | Admit: 2013-05-24 | Discharge: 2013-05-24 | Disposition: A | Discharge status: Home / Self Care | Payer: Medicare Other | Source: Ambulatory Visit | Attending: Family Medicine | Admitting: Family Medicine

## 2013-05-24 DIAGNOSIS — K439 Ventral hernia without obstruction or gangrene: Secondary | ICD-10-CM | POA: Diagnosis not present

## 2013-05-24 MED ORDER — IOHEXOL 300 MG/ML  SOLN
125.0000 mL | Freq: Once | INTRAMUSCULAR | Status: AC | PRN
  Administered 2013-05-24: 125 mL via INTRAVENOUS

## 2013-05-27 ENCOUNTER — Other Ambulatory Visit: Payer: Self-pay | Admitting: Family Medicine

## 2013-05-27 NOTE — Telephone Encounter (Signed)
I spoke w/ pt; he still has some abdominal wall tenderness but no redness or pain. BMs normal. No ventral hernia identified though abdominal wall muscles are lax. Large hiatal hernia seen but pt aware of this. He is advised to seek care if symptoms worsen; otherwise, he has an appt next month.

## 2013-06-06 ENCOUNTER — Other Ambulatory Visit: Payer: Self-pay | Admitting: Family Medicine

## 2013-06-14 NOTE — Progress Notes (Signed)
Received PA req for Nexium. Called pt to check status and he verified that we just completed a PA recently for this and he got his Nexium last week and was covered by ins.

## 2013-06-21 ENCOUNTER — Ambulatory Visit (INDEPENDENT_AMBULATORY_CARE_PROVIDER_SITE_OTHER): Payer: Medicare Other | Admitting: Family Medicine

## 2013-06-21 ENCOUNTER — Encounter: Payer: Self-pay | Admitting: Family Medicine

## 2013-06-21 VITALS — BP 188/60 | HR 90 | Temp 98.7°F | Resp 20 | Ht 71.0 in | Wt 291.0 lb

## 2013-06-21 DIAGNOSIS — G8929 Other chronic pain: Secondary | ICD-10-CM

## 2013-06-21 DIAGNOSIS — F1120 Opioid dependence, uncomplicated: Secondary | ICD-10-CM

## 2013-06-21 DIAGNOSIS — I10 Essential (primary) hypertension: Secondary | ICD-10-CM

## 2013-06-21 DIAGNOSIS — Z760 Encounter for issue of repeat prescription: Secondary | ICD-10-CM

## 2013-06-21 DIAGNOSIS — M546 Pain in thoracic spine: Secondary | ICD-10-CM

## 2013-06-21 MED ORDER — OXYCODONE HCL ER 15 MG PO T12A: 15.0000 mg | EXTENDED_RELEASE_TABLET | Freq: Two times a day (BID) | ORAL | Status: DC

## 2013-06-21 MED ORDER — OXYCODONE HCL 5 MG PO TABS: ORAL_TABLET | ORAL | Status: DC

## 2013-06-21 NOTE — Progress Notes (Signed)
S: This 62 y.o. Cauc male is here for follow-up of back and abd pain. 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 He feels the discomfort more w/ certain movements and when stretching. Pt has chronic hiatal hernia but has no change of dyspepsia; he denies n/v or increased dysphagia. He actually has improved appetite and is eating more fruits and grilled chicken w/ some soft vegetables. He reports FSBS are better. Pt will contact GI specialist to get EGD scheduled; he thinks he may have another stricture developing.  Chronic Pain/ opioid use- pt is coping w/ medication reduction. He takes oxycontin every 12 hours and feels that this medication dose can be further reduced to 10 mg at next visit. The oxycodone 5 mg tabs are taken twice a day most days; he does need to take 3 tabs on some days. He feels good about how he is tolerating the weaning process.  HTN- pt is compliant w/ medications and denies diaphoresis, fatigue, CP or tightness, palpitations, HA, dizziness, weakness, tremor, numbness or syncope.  PMHx, Soc Hx and Problem List reviewed. Medications reconciled.  ROS: As per HPI.  O:  Filed Vitals:   06/21/13 1300  BP: 188/60                                  BP recheck in R arm:  130/80.  Pulse: 90  Temp: 98.7 F (37.1 C)  Resp: 20   GEN: In NAD; WN,WD. COR: RRR. LUNGS: CTA; distant BS. Normal resp rate and effort. BACK: T-spine tenderness over midline bony vertebral process; no crepitus. No paravertebral muscle tenderness. NEURO: A&O x 3; CNs intact. Nonfocal.  A/P: Chronic thoracic spine pain- We discussed MRI imaging to get more details but pt declines at this time. He states he will proceed w/ further imaging if pain worsens.  Opioid dependence- Continue Oxycontin 15 mg 12 hr tablets for additional 30 days; will reduce dose to 10 mg per tab at next visit. Continue Oxycodone 5 mg IR tablets 1 tablets every 8 hours prn breakthrough  pain.  HYPERTENSION- Stable and controlled on current medications.  Medication refill Meds ordered this encounter  Medications  . OxyCODONE (OXYCONTIN) 15 mg T12A 12 hr tablet    Sig: Take 1 tablet (15 mg total) by mouth every 12 (twelve) hours.    Dispense:  60 tablet    Refill:  0  . oxyCODONE (OXY IR/ROXICODONE) 5 MG immediate release tablet    Sig: 1 tablet every 8 hours as needed for pain    Dispense:  90 tablet    Refill:  0

## 2013-06-21 NOTE — Patient Instructions (Addendum)
Your next visit will be a Diabetes follow-up. We will work on reducing the dose of the Oxycontin at your next visit.

## 2013-06-24 ENCOUNTER — Telehealth: Payer: Self-pay

## 2013-06-24 MED ORDER — PREGABALIN 100 MG PO CAPS: 100.0000 mg | ORAL_CAPSULE | Freq: Three times a day (TID) | ORAL | Status: DC

## 2013-06-24 NOTE — Telephone Encounter (Signed)
Lyrica refill authorized for 6 months.

## 2013-06-24 NOTE — Telephone Encounter (Signed)
Pharm reqs RFs of Lyrica 100 mg.

## 2013-06-27 ENCOUNTER — Telehealth: Payer: Self-pay

## 2013-06-27 NOTE — Telephone Encounter (Signed)
PT STATES HE IS OUT OF HIS LYRICA AND NEED TO HAVE A REFILL TODAY IF POSSIBLE PLEASE CALL [phone]   [facility] ON [address]

## 2013-06-27 NOTE — Telephone Encounter (Signed)
This has already been sent in called him to advise.

## 2013-06-29 NOTE — Telephone Encounter (Signed)
Received another faxed req for RF of Lyrica from [facility]. I called in Rx info to pharm w/ message that they should have already received RF on 06/24/13 but if not to take the verbal order.

## 2013-07-14 ENCOUNTER — Encounter: Payer: Self-pay | Admitting: Family Medicine

## 2013-07-14 ENCOUNTER — Ambulatory Visit (INDEPENDENT_AMBULATORY_CARE_PROVIDER_SITE_OTHER): Payer: Medicare Other | Admitting: Family Medicine

## 2013-07-14 VITALS — BP 138/64 | HR 85 | Temp 98.5°F | Resp 18 | Ht 71.0 in | Wt 292.0 lb

## 2013-07-14 DIAGNOSIS — Z23 Encounter for immunization: Secondary | ICD-10-CM | POA: Diagnosis not present

## 2013-07-14 DIAGNOSIS — Z79899 Other long term (current) drug therapy: Secondary | ICD-10-CM

## 2013-07-14 DIAGNOSIS — M5137 Other intervertebral disc degeneration, lumbosacral region: Secondary | ICD-10-CM

## 2013-07-14 DIAGNOSIS — M51379 Other intervertebral disc degeneration, lumbosacral region without mention of lumbar back pain or lower extremity pain: Secondary | ICD-10-CM

## 2013-07-14 MED ORDER — OXYCODONE HCL 5 MG PO TABS: ORAL_TABLET | ORAL | Status: DC

## 2013-07-14 MED ORDER — OXYCODONE HCL ER 10 MG PO T12A: 10.0000 mg | EXTENDED_RELEASE_TABLET | Freq: Two times a day (BID) | ORAL | Status: DC

## 2013-07-14 MED ORDER — ZOSTER VACCINE LIVE 19400 UNT/0.65ML ~~LOC~~ SOLR: 0.6500 mL | Freq: Once | SUBCUTANEOUS | Status: DC

## 2013-07-16 ENCOUNTER — Encounter: Payer: Self-pay | Admitting: Family Medicine

## 2013-07-16 NOTE — Progress Notes (Signed)
S:  This 62 y.o. Cauc male has chronic pain and takes narcotics. He was managed by Pain Specialist for a few months but cost of visits became prohibitive. Pt desires to wean off narcotics by end of 2014. He is here for monthly follow-up and prescription. He is ready to reduce dose of long-acting opioid. Pain described as mild-moderate; pt thinks he is doing well with current plan of pain management. 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  Pt requests prescription for new wheelchair; current one is in need of repair. Pt uses WC when he goes out to shop and for other errands when he cannot walk long distances.  Patient Active Problem List   Diagnosis Date Noted  . S/P implantation of prosthetic limb device August 28, 202013  . Disturbance of skin sensation 03/25/2012  . Sleep apnea 02/20/2012  . 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 CAD (coronary artery disease) 11/25/2011  . Encounter for long-term (current) use of other medications 03/03/2011  . 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 SHOULDER PAIN, BILATERAL 01/10/2010  . UNSPECIFIED PERIPHERAL VASCULAR DISEASE 11/12/2009  . ESOPHAGITIS 11/12/2009  . ANEMIA, IRON DEFICIENCY 10/11/2009  . DEPRESSION 05/17/2009  . HYPERCHOLESTEROLEMIA 09/18/2008  . PRIMARY HYPERPARATHYROIDISM 01/21/2008  . DDD (degenerative disc disease), lumbosacral 01/21/2008  . TYPE II or UNSPECIFIED TYPE DIABETES without mention of complication, uncontrolled 03/29/2007  . HYPERTENSION 03/29/2007   PMHx & Surg Hx, Soc hx reviewed. Medications reconciled.  ROS: Noncontributory.  O: Filed Vitals:   07/14/13 1121  BP: 138/64  Pulse: 85  Temp: 98.5 F (36.9 C)  Resp: 18   GEN: In NAD; WN,WD. COR: RRR. LUNGS: Normal resp rate and effort. SKIN: W&D; intact w/o diaphoresis, erythema, rashes or pallor. MS: R prosthetic limb. NEURO: A&O x 3; Cns intact. Nonfocal. Flat affect  noted.  A/P: Encounter for long-term (current) use of other medications  Need for prophylactic vaccination and inoculation against influenza - Plan: Flu Vaccine QUAD 36+ mos IM, DME Wheelchair manual  Need for shingles vaccine - Plan: zoster vaccine live, PF, (ZOSTAVAX) 16109 UNT/0.65ML injection  DDD (degenerative disc disease), lumbosacral  Meds ordered this encounter  Medications  . oxyCODONE (OXY IR/ROXICODONE) 5 MG immediate release tablet    Sig: 1 tablet every 8 hours as needed for pain    Dispense:  90 tablet    Refill:  0    Do not fill before July 19, 2013.  . OxyCODONE (OXYCONTIN) 10 mg T12A 12 hr tablet    Sig: Take 1 tablet (10 mg total) by mouth every 12 (twelve) hours.    Dispense:  60 tablet    Refill:  0    Do not fill before Jul 19, 2013.  . zoster vaccine live, PF, (ZOSTAVAX) 60454 UNT/0.65ML injection    Sig: Inject 19,400 Units into the skin once.    Dispense:  1 each    Refill:  0

## 2013-08-16 ENCOUNTER — Ambulatory Visit (INDEPENDENT_AMBULATORY_CARE_PROVIDER_SITE_OTHER): Payer: Medicare Other | Admitting: Family Medicine

## 2013-08-16 ENCOUNTER — Encounter: Payer: Self-pay | Admitting: Family Medicine

## 2013-08-16 VITALS — BP 134/68 | HR 96 | Temp 97.9°F | Resp 20 | Ht 70.0 in | Wt 288.0 lb

## 2013-08-16 DIAGNOSIS — M5134 Other intervertebral disc degeneration, thoracic region: Secondary | ICD-10-CM

## 2013-08-16 DIAGNOSIS — G894 Chronic pain syndrome: Secondary | ICD-10-CM | POA: Diagnosis not present

## 2013-08-16 DIAGNOSIS — IMO0002 Reserved for concepts with insufficient information to code with codable children: Secondary | ICD-10-CM | POA: Diagnosis not present

## 2013-08-16 MED ORDER — OXYCODONE HCL ER 10 MG PO T12A: 10.0000 mg | EXTENDED_RELEASE_TABLET | Freq: Two times a day (BID) | ORAL | Status: DC

## 2013-08-16 MED ORDER — OXYCODONE HCL 5 MG PO TABS: ORAL_TABLET | ORAL | Status: DC

## 2013-08-16 NOTE — Progress Notes (Signed)
S:  This 62 y.o. Cauc male has chronic back pain and reports worsening in last 2-3 weeks. He has not fallen but did stumble against a door. He last back surgery was > 10 years ago. The pain is in mid-back and pain radiating to front/ upper abdomen seems to be coming from inside. Pt does have severe GERD w/ large hiatal hernia and partial gastrectomy in 2011. Pt is doubtful that he will be able to return to the surgeon that performed lumbar procedure; that physician is no longer practicing. It has been this pt's experience that other surgeons are reluctant to "follow behind others surgeons work or clean other doctors' mistakes".  His activity is limited by pain; he sleeps "pretty good" if "I take all my pills". He still needs to sleep in a recliner; lying flat results in vomiting. Pt is complaint with current pain medications. Pt plans to contact GI specialist to get endoscopy re-study scheduled.  Problem List, PMHx and Surg Hx, Soc and Fam Hx reviewed.  Medications reconciled.  ROS: As per HPI.  O: Filed Vitals:   08/16/13 1333  BP: 134/68  Pulse: 96  Temp: 97.9 F (36.6 C)  Resp: 20   GEN: In mild distress; WN,WD w. truncal obesity. HENT: [place]/AT; EOMI w/ clear conj/sclerae. Otherwise unremarkable. COR: RRR. LUNGS: Normal resp rate and effort. BACK: Mid T-spine tenderness w/ palpation of spinous processes. ABD: Distended w/ mild upper/ epig tenderness. Abd wall muscle laxity. SKIN: W&D; intact w/ chronic dryness and scars over extremities. NEURO: A&O x 3; CNs intact. Gait abnormal due to R lower ext prosthetic and chronic back pain. Pt ambulates w/ a cane.  A/P: DDD (degenerative disc disease), thoracic - Pt will need to be referred to a spine specialist for definitive treatment.  Plan: MR Thoracic Spine Wo Contrast  Chronic pain syndrome- Continue current medications; will resume weaning of narcotics after this current pain issue is clarified and resolved.  Meds ordered this encounter   Medications  . oxyCODONE (OXY IR/ROXICODONE) 5 MG immediate release tablet    Sig: 1 tablet every 8 hours as needed for pain    Dispense:  90 tablet    Refill:  0    Do not fill before August 18, 2013.  . OxyCODONE (OXYCONTIN) 10 mg T12A 12 hr tablet    Sig: Take 1 tablet (10 mg total) by mouth every 12 (twelve) hours.    Dispense:  60 tablet    Refill:  0    Do not fill before August 18, 2013.

## 2013-08-29 ENCOUNTER — Ambulatory Visit
Admission: RE | Admit: 2013-08-29 | Discharge: 2013-08-29 | Disposition: A | Discharge status: Home / Self Care | Payer: Medicare Other | Source: Ambulatory Visit | Attending: Family Medicine | Admitting: Family Medicine

## 2013-08-29 DIAGNOSIS — M5124 Other intervertebral disc displacement, thoracic region: Secondary | ICD-10-CM | POA: Diagnosis not present

## 2013-08-29 DIAGNOSIS — M5134 Other intervertebral disc degeneration, thoracic region: Secondary | ICD-10-CM

## 2013-08-31 NOTE — Progress Notes (Signed)
Quick Note:  Your MRI is abnormal; it shows a bulging disc. You have a visit for early January 2015. If you do not want to come in before then and your pain medication is effective, we can discuss this problem at the next visit. ______

## 2013-09-12 ENCOUNTER — Other Ambulatory Visit: Payer: Self-pay | Admitting: Cardiovascular Disease

## 2013-09-15 ENCOUNTER — Other Ambulatory Visit: Payer: Self-pay | Admitting: Family Medicine

## 2013-09-16 ENCOUNTER — Ambulatory Visit (INDEPENDENT_AMBULATORY_CARE_PROVIDER_SITE_OTHER): Payer: Medicare Other | Admitting: Family Medicine

## 2013-09-16 ENCOUNTER — Encounter: Payer: Self-pay | Admitting: Family Medicine

## 2013-09-16 VITALS — BP 150/56 | HR 92 | Temp 98.1°F | Resp 18 | Ht 70.0 in | Wt 286.6 lb

## 2013-09-16 DIAGNOSIS — G894 Chronic pain syndrome: Secondary | ICD-10-CM

## 2013-09-16 DIAGNOSIS — IMO0002 Reserved for concepts with insufficient information to code with codable children: Secondary | ICD-10-CM | POA: Diagnosis not present

## 2013-09-16 DIAGNOSIS — IMO0001 Reserved for inherently not codable concepts without codable children: Secondary | ICD-10-CM | POA: Diagnosis not present

## 2013-09-16 DIAGNOSIS — E1165 Type 2 diabetes mellitus with hyperglycemia: Secondary | ICD-10-CM

## 2013-09-16 DIAGNOSIS — M5134 Other intervertebral disc degeneration, thoracic region: Secondary | ICD-10-CM

## 2013-09-16 LAB — POCT GLYCOSYLATED HEMOGLOBIN (HGB A1C): Hemoglobin A1C: 8.9

## 2013-09-16 MED ORDER — ESOMEPRAZOLE MAGNESIUM 40 MG PO CPDR: 40.0000 mg | DELAYED_RELEASE_CAPSULE | Freq: Two times a day (BID) | ORAL | Status: DC

## 2013-09-16 MED ORDER — OXYCODONE HCL ER 10 MG PO T12A: 10.0000 mg | EXTENDED_RELEASE_TABLET | Freq: Two times a day (BID) | ORAL | Status: DC

## 2013-09-16 MED ORDER — OXYCODONE HCL 5 MG PO TABS: ORAL_TABLET | ORAL | Status: DC

## 2013-09-16 NOTE — Patient Instructions (Signed)
I have placed an order for a referral to [facility] located at [address]  Ph# [phone]  (this is located off [address] headed towards [address]).  If you do not have an appointment within the month and it is time for pain medication refill, call the office for an office visit with me.  DIABETES- not well controlled (A1c= 8.9%).  Work on improving your nutrition; limit soda intake as we discussed before and consume less starchy foods.  Diabetes Meal Planning Guide The diabetes meal planning guide is a tool to help you plan your meals and snacks. It is important for people with diabetes to manage their blood glucose (sugar) levels. Choosing the right foods and the right amounts throughout your day will help control your blood glucose. Eating right can even help you improve your blood pressure and reach or maintain a healthy weight. CARBOHYDRATE COUNTING MADE EASY When you eat carbohydrates, they turn to sugar. This raises your blood glucose level. Counting carbohydrates can help you control this level so you feel better. When you plan your meals by counting carbohydrates, you can have more flexibility in what you eat and balance your medicine with your food intake. Carbohydrate counting simply means adding up the total amount of carbohydrate grams in your meals and snacks. Try to eat about the same amount at each meal. Foods with carbohydrates are listed below. Each portion below is 1 carbohydrate serving or 15 grams of carbohydrates. Ask your dietician how many grams of carbohydrates you should eat at each meal or snack. Grains and Starches  1 slice bread.   English muffin or hotdog/hamburger bun.   cup cold cereal (unsweetened).   cup cooked pasta or rice.   cup starchy vegetables (corn, potatoes, peas, beans, winter squash).  1 tortilla (6 inches).   bagel.  1 waffle or pancake (size of a CD).   cup cooked cereal.  4 to 6 small crackers. *Whole  grain is recommended. Fruit  1 cup fresh unsweetened berries, melon, papaya, pineapple.  1 small fresh fruit.   banana or mango.   cup fruit juice (4 oz unsweetened).   cup canned fruit in natural juice or water.  2 tbs dried fruit.  12 to 15 grapes or cherries. Milk and Yogurt  1 cup fat-free or 1% milk.  1 cup soy milk.  6 oz light yogurt with sugar-free sweetener.  6 oz low-fat soy yogurt.  6 oz plain yogurt. Vegetables  1 cup raw or  cup cooked is counted as 0 carbohydrates or a "free" food.  If you eat 3 or more servings at 1 meal, count them as 1 carbohydrate serving. Other Carbohydrates   oz chips or pretzels.   cup ice cream or frozen yogurt.   cup sherbet or sorbet.  2 inch square cake, no frosting.  1 tbs honey, sugar, jam, jelly, or syrup.  2 small cookies.  3 squares of graham crackers.  3 cups popcorn.  6 crackers.  1 cup broth-based soup.  Count 1 cup casserole or other mixed foods as 2 carbohydrate servings.  Foods with less than 20 calories in a serving may be counted as 0 carbohydrates or a "free" food. You may want to purchase a book or computer software that lists the carbohydrate gram counts of different foods. In addition, the nutrition facts panel on the labels of the foods you eat are a good source of this information. The label will tell you how big  the serving size is and the total number of carbohydrate grams you will be eating per serving. Divide this number by 15 to obtain the number of carbohydrate servings in a portion. Remember, 1 carbohydrate serving equals 15 grams of carbohydrate. SERVING SIZES Measuring foods and serving sizes helps you make sure you are getting the right amount of food. The list below tells how big or small some common serving sizes are.  1 oz.........4 stacked dice.  3 oz........Marland KitchenDeck of cards.  1 tsp.......Marland KitchenTip of little finger.  1 tbs......Marland KitchenMarland KitchenThumb.  2 tbs.......Marland KitchenGolf ball.    cup......Marland KitchenHalf of a fist.  1 cup.......Marland KitchenA fist. SAMPLE DIABETES MEAL PLAN Below is a sample meal plan that includes foods from the grain and starches, dairy, vegetable, fruit, and meat groups. A dietician can individualize a meal plan to fit your calorie needs and tell you the number of servings needed from each food group. However, controlling the total amount of carbohydrates in your meal or snack is more important than making sure you include all of the food groups at every meal. You may interchange carbohydrate containing foods (dairy, starches, and fruits). The meal plan below is an example of a 2000 calorie diet using carbohydrate counting. This meal plan has 17 carbohydrate servings. Breakfast  1 cup oatmeal (2 carb servings).   cup light yogurt (1 carb serving).  1 cup blueberries (1 carb serving).   cup almonds. Snack  1 large apple (2 carb servings).  1 low-fat string cheese stick. Lunch  Chicken breast salad.  1 cup spinach.   cup chopped tomatoes.  2 oz chicken breast, sliced.  2 tbs low-fat [place] dressing.  12 whole-wheat crackers (2 carb servings).  12 to 15 grapes (1 carb serving).  1 cup low-fat milk (1 carb serving). Snack  1 cup carrots.   cup hummus (1 carb serving). Dinner  3 oz broiled salmon.  1 cup brown rice (3 carb servings). Snack  1  cups steamed broccoli (1 carb serving) drizzled with 1 tsp olive oil and lemon juice.  1 cup light pudding (2 carb servings). DIABETES MEAL PLANNING WORKSHEET Your dietician can use this worksheet to help you decide how many servings of foods and what types of foods are right for you.  BREAKFAST Food Group and Servings / Carb Servings Grain/Starches __________________________________ Dairy __________________________________________ Vegetable ______________________________________ Fruit ___________________________________________ Meat __________________________________________ Fat  ____________________________________________ LUNCH Food Group and Servings / Carb Servings Grain/Starches ___________________________________ Dairy ___________________________________________ Fruit ____________________________________________ Meat ___________________________________________ Fat _____________________________________________ Wonda [employer] Food Group and Servings / Carb Servings Grain/Starches ___________________________________ Dairy ___________________________________________ Fruit ____________________________________________ Meat ___________________________________________ Fat _____________________________________________ SNACKS Food Group and Servings / Carb Servings Grain/Starches ___________________________________ Dairy ___________________________________________ Vegetable _______________________________________ Fruit ____________________________________________ Meat ___________________________________________ Fat _____________________________________________ DAILY TOTALS Starches _________________________ Vegetable ________________________ Fruit ____________________________ Dairy ____________________________ Meat ____________________________ Fat ______________________________ Document Released: 05/29/2005 Document Revised: 11/24/2011 Document Reviewed: 04/09/2009 [employer] Patient Information 2014 [employer].
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
## 2013-09-20 ENCOUNTER — Telehealth: Payer: Self-pay

## 2013-09-20 NOTE — Telephone Encounter (Signed)
PA needed for Nexium. Called pt who advised he has tried omeprazole ("makes him crazy"), zantac and pepcid OTC, and Rxs for protonix, prevacid and dexilant prev. Has been stable on Nexium for over 5 years. Completed form on covermymeds.

## 2013-09-22 NOTE — Telephone Encounter (Signed)
Is there a phone number that I can call to discuss this issue with PA representative?

## 2013-09-22 NOTE — Telephone Encounter (Signed)
Received a denial for coverage which I am sending to Dr Leward Quan at 104. Dr Leward Quan, I listed all of the below prev tried meds on PA form so not sure what else we can do?

## 2013-09-23 ENCOUNTER — Telehealth: Payer: Self-pay | Admitting: Family Medicine

## 2013-09-23 DIAGNOSIS — K449 Diaphragmatic hernia without obstruction or gangrene: Principal | ICD-10-CM

## 2013-09-23 DIAGNOSIS — K219 Gastro-esophageal reflux disease without esophagitis: Secondary | ICD-10-CM

## 2013-09-23 NOTE — Telephone Encounter (Signed)
Attempts to get PA for Nexium- I phoned [facility] and was informed that I can appeal the denial by calling "Member Services". The phone # is located on the back of pt's card. I left a msg for pt to call us with that phone number.

## 2013-09-23 NOTE — Telephone Encounter (Signed)
I phoned Member Services and spoke w/ rep who advised that 1st part  of Appeals process is faxing a letter to [employer]. Fax # [phone]- [phone]. Decision in 48 hours or may take 7 days.

## 2013-09-23 NOTE — Telephone Encounter (Signed)
Patient is returning Dr. Janelle Floor call. Telephone # for [facility] member services is [phone].

## 2013-09-28 ENCOUNTER — Encounter: Payer: Self-pay | Admitting: Family Medicine

## 2013-09-29 DIAGNOSIS — IMO0002 Reserved for concepts with insufficient information to code with codable children: Secondary | ICD-10-CM | POA: Diagnosis not present

## 2013-09-29 DIAGNOSIS — M459 Ankylosing spondylitis of unspecified sites in spine: Secondary | ICD-10-CM | POA: Diagnosis not present

## 2013-09-29 DIAGNOSIS — M545 Low back pain, unspecified: Secondary | ICD-10-CM | POA: Diagnosis not present

## 2013-09-29 DIAGNOSIS — M5137 Other intervertebral disc degeneration, lumbosacral region: Secondary | ICD-10-CM | POA: Diagnosis not present

## 2013-09-29 DIAGNOSIS — M546 Pain in thoracic spine: Secondary | ICD-10-CM | POA: Diagnosis not present

## 2013-10-05 DIAGNOSIS — M546 Pain in thoracic spine: Secondary | ICD-10-CM | POA: Diagnosis not present

## 2013-10-06 DIAGNOSIS — M47817 Spondylosis without myelopathy or radiculopathy, lumbosacral region: Secondary | ICD-10-CM | POA: Diagnosis not present

## 2013-10-10 DIAGNOSIS — M546 Pain in thoracic spine: Secondary | ICD-10-CM | POA: Diagnosis not present

## 2013-10-12 DIAGNOSIS — M546 Pain in thoracic spine: Secondary | ICD-10-CM | POA: Diagnosis not present

## 2013-10-13 NOTE — Telephone Encounter (Signed)
There are some gaps in the messages here re: PA for Nexium.  A letter to [employer] dated 09/28/2013 was faxed, appealing the initial denial. What I understand now is that [facility] has denied Nexium after the letter appeal. Is that correct?

## 2013-10-13 NOTE — Telephone Encounter (Signed)
Received denial of coverage after review by [facility]. I am sending the denial letter to Dr Leward Quan at 104.

## 2013-10-17 DIAGNOSIS — M546 Pain in thoracic spine: Secondary | ICD-10-CM | POA: Diagnosis not present

## 2013-10-18 NOTE — Telephone Encounter (Signed)
Yes, I believe the latest letter I sent over to you was a denial of the appeal, but will come get the letter and double check it.

## 2013-10-19 ENCOUNTER — Encounter: Payer: Self-pay | Admitting: Family Medicine

## 2013-10-19 ENCOUNTER — Ambulatory Visit (INDEPENDENT_AMBULATORY_CARE_PROVIDER_SITE_OTHER): Payer: Medicare Other | Admitting: Family Medicine

## 2013-10-19 VITALS — BP 166/64 | HR 101 | Temp 99.0°F | Resp 18 | Ht 72.0 in | Wt 278.4 lb

## 2013-10-19 DIAGNOSIS — Z760 Encounter for issue of repeat prescription: Secondary | ICD-10-CM | POA: Diagnosis not present

## 2013-10-19 DIAGNOSIS — G894 Chronic pain syndrome: Secondary | ICD-10-CM | POA: Diagnosis not present

## 2013-10-19 MED ORDER — OXYCODONE HCL ER 10 MG PO T12A: 10.0000 mg | EXTENDED_RELEASE_TABLET | Freq: Two times a day (BID) | ORAL | Status: DC

## 2013-10-19 MED ORDER — OXYCODONE HCL 5 MG PO TABS: ORAL_TABLET | ORAL | Status: DC

## 2013-10-19 NOTE — Progress Notes (Signed)
S:  This 63 y.o. Cauc male has multiple medical issues including HTN, chronic GERD and chronic pain syndrome. He is compliant w/ all medications. Nexium has been the PPI that has controlled severe reflux; this medication is not covered by his insurer despite letter outlining why pt needs this brand. Pt reports GI specialist is still trying to get medication approved. Currently, he takes OTC Omeprazole 20 mg bid. He is here for medication refills. Chronic pain is well controlled on medications and pt has been evaluated by Dr. Patrice Paradise who prescribed Meloxicam. Pt is exercising daily per instructions of Dr. Patrice Paradise and has lost weight. He has limited sodas to 2/day (down from 4-5/day).  Pt c/o sore on back of lower thigh where prosthetic limb rubs against skin. There is no pus or streaky redness. He is treating the lesion w/ topical Neosporin and loose bandage. He tries to leave prosthesis off as much as he can while this sore is healing.  PMHx, Surg Hx, Soc and Fam Hx reviewed.  MEDICATIONS reconciled.  ROS: As per HPI.  O: Filed Vitals:   10/19/13 1137  BP: 166/64  Pulse: 101  Temp: 99 F (37.2 C)  Resp: 18   GEN: In NAD; WN,WD. SKIN: W&D; intact w/ scattered erythematous lesions, healing abrasions and fading ecchymoses. COR: RRR. LUNGS: Unlabored resp. MS: R BKA w/ prosthetic limb. NEURO: A&O x 3; CNs intact. Otherwise, at baseline.  A/P: Chronic pain syndrome- Refill medications. Encouraged about dietary changes and weight loss.  Issue of repeat prescriptions Meds ordered this encounter  Medications  . meloxicam (MOBIC) 15 MG tablet    Sig: Take 15 mg by mouth daily.  Marland Kitchen omeprazole (PRILOSEC) 20 MG capsule    Sig: Take 20 mg by mouth 2 (two) times daily before a meal.  . oxyCODONE (OXY IR/ROXICODONE) 5 MG immediate release tablet    Sig: 1 tablet every 8 hours as needed for pain    Dispense:  100 tablet    Refill:  0  . OxyCODONE (OXYCONTIN) 10 mg T12A 12 hr tablet    Sig: Take 1  tablet (10 mg total) by mouth every 12 (twelve) hours.    Dispense:  60 tablet    Refill:  0

## 2013-10-20 DIAGNOSIS — M546 Pain in thoracic spine: Secondary | ICD-10-CM | POA: Diagnosis not present

## 2013-10-24 DIAGNOSIS — M546 Pain in thoracic spine: Secondary | ICD-10-CM | POA: Diagnosis not present

## 2013-10-28 ENCOUNTER — Telehealth: Payer: Self-pay | Admitting: Family Medicine

## 2013-10-28 ENCOUNTER — Other Ambulatory Visit: Payer: Self-pay | Admitting: Family Medicine

## 2013-10-28 MED ORDER — INSULIN LISPRO PROT & LISPRO (50-50 MIX) 100 UNIT/ML KWIKPEN: 75.0000 [IU] | PEN_INJECTOR | Freq: Two times a day (BID) | SUBCUTANEOUS | Status: DC

## 2013-10-28 NOTE — Telephone Encounter (Signed)
Advised pt of refill- reminded him he needed to RTC in April for his lab work.

## 2013-10-28 NOTE — Telephone Encounter (Signed)
Patients wife called stating her husband need a refill on his Insulin Lispro Prot & Lispro (50-50) 100 UNIT/ML SUPN

## 2013-10-31 DIAGNOSIS — M546 Pain in thoracic spine: Secondary | ICD-10-CM | POA: Diagnosis not present

## 2013-11-03 DIAGNOSIS — M546 Pain in thoracic spine: Secondary | ICD-10-CM | POA: Diagnosis not present

## 2013-11-07 DIAGNOSIS — M546 Pain in thoracic spine: Secondary | ICD-10-CM | POA: Diagnosis not present

## 2013-11-14 DIAGNOSIS — M546 Pain in thoracic spine: Secondary | ICD-10-CM | POA: Diagnosis not present

## 2013-11-16 ENCOUNTER — Ambulatory Visit (INDEPENDENT_AMBULATORY_CARE_PROVIDER_SITE_OTHER): Payer: Medicare Other | Admitting: Family Medicine

## 2013-11-16 ENCOUNTER — Encounter: Payer: Self-pay | Admitting: Family Medicine

## 2013-11-16 VITALS — BP 160/80 | HR 90 | Resp 18 | Wt 286.0 lb

## 2013-11-16 DIAGNOSIS — K219 Gastro-esophageal reflux disease without esophagitis: Secondary | ICD-10-CM | POA: Diagnosis not present

## 2013-11-16 DIAGNOSIS — Z760 Encounter for issue of repeat prescription: Secondary | ICD-10-CM

## 2013-11-16 DIAGNOSIS — G894 Chronic pain syndrome: Secondary | ICD-10-CM

## 2013-11-16 DIAGNOSIS — I10 Essential (primary) hypertension: Secondary | ICD-10-CM

## 2013-11-16 DIAGNOSIS — Z0489 Encounter for examination and observation for other specified reasons: Secondary | ICD-10-CM

## 2013-11-16 DIAGNOSIS — K449 Diaphragmatic hernia without obstruction or gangrene: Secondary | ICD-10-CM

## 2013-11-16 MED ORDER — OXYCODONE HCL ER 10 MG PO T12A: 10.0000 mg | EXTENDED_RELEASE_TABLET | Freq: Two times a day (BID) | ORAL | Status: DC

## 2013-11-16 MED ORDER — OXYCODONE HCL 5 MG PO TABS: ORAL_TABLET | ORAL | Status: DC

## 2013-11-16 MED ORDER — INSULIN LISPRO PROT & LISPRO (50-50 MIX) 100 UNIT/ML KWIKPEN: PEN_INJECTOR | SUBCUTANEOUS | Status: DC

## 2013-11-16 MED ORDER — INSULIN PEN NEEDLE 31G X 8 MM MISC: Status: DC

## 2013-11-16 MED ORDER — GLUCOSE BLOOD VI STRP: ORAL_STRIP | Status: DC

## 2013-11-16 MED ORDER — LORAZEPAM 0.5 MG PO TABS: ORAL_TABLET | ORAL | Status: DC

## 2013-11-17 ENCOUNTER — Encounter: Payer: Self-pay | Admitting: Family Medicine

## 2013-11-17 NOTE — Progress Notes (Signed)
S: This 63 y.o. Cauc male has chronic pain involving his spine and lower extremities. He has completed PT as ordered by spine specialist; does not feel much improved. Current pain medication doses are effective; narcotics have not been weaned in 3 months due to persistent pain. Pt to follow-up w/ Dr. Patrice Paradise to discuss next step in treatment.   HTN- pt is compliant w/ medication but notes increased readings w/ chronic pain and sleep difficulties. He sleeps in a recliner instead of a bed due to severe GERD. He again states that he will contact GI and schedule follow-up. Pt denies diaphoresis, vision disturbances, CP or tightness, palpitations, HA, dizziness, unilateral numbness or weakness or syncope.  DM- FSBS= 120-160; no values > 200. Denies hypoglycemia. Trying to eat healthier but difficult due to GERD.  Patient Active Problem List   Diagnosis Date Noted  . Gastroesophageal reflux disease with hiatal hernia 09/23/2013  . Chronic pain syndrome 09/16/2013  . 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 Disturbance of skin sensation 03/25/2012  . Sleep apnea 02/20/2012  . 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 CAD (coronary artery disease) 11/25/2011  . Encounter for long-term (current) use of other medications 03/03/2011  . 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 SHOULDER PAIN, BILATERAL 01/10/2010  . UNSPECIFIED PERIPHERAL VASCULAR DISEASE 11/12/2009  . ESOPHAGITIS 11/12/2009  . ANEMIA, IRON DEFICIENCY 10/11/2009  . DEPRESSION 05/17/2009  . HYPERCHOLESTEROLEMIA 09/18/2008  . PRIMARY HYPERPARATHYROIDISM 01/21/2008  . DDD (degenerative disc disease), lumbosacral 01/21/2008  . TYPE II or UNSPECIFIED TYPE DIABETES without mention of complication, uncontrolled 03/29/2007  . HYPERTENSION 03/29/2007   PMHx, Surg Hx, Soc and Fam Hx reviewed.  MEDICATIONS reconciled.  ROS: As per HPI.  O: Filed Vitals:   11/16/13 1045  BP: 160/80  Pulse: 90  Resp: 18   GEN: In NAD; weight down 2  lbs from Dec 2014. HENT: [place]/AT; EOMI w/ clear conj/sclerae. Otherwise unremarkable. COR: RRR. LUNGS: Normal resp rate and effort. SKIN: W&D; intact w/o  Diaphoresis, erythema or pallor. MS: R lower ext prosthesis; moves all other extremities. Ambulates w/ cane; gait antalgic. NEURO: A&O x 3; CNs intact. Nonfocal.  A/P: Chronic pain syndrome- Refill medication and obtain urine drug screen.  HYPERTENSION- Continue current medication. Consider adding ACEI or ARB; last urine microalb in 2013 was in normal range.  Gastroesophageal reflux disease with hiatal hernia- Pt to schedule with GI.  Issue of repeat prescriptions  Examination for medicolegal reason  Meds ordered this encounter  Medications  . Insulin Lispro Prot & Lispro (HUMALOG 50/50 MIX) (50-50) 100 UNIT/ML Kwikpen    Sig: Inject 75-125 Units into the skin 2 (two) times daily with a meal. 125 units in the morning and 95 units at night  . Insulin Lispro Prot & Lispro (HUMALOG 50/50 MIX) (50-50) 100 UNIT/ML Kwikpen    Sig: Use 125 units in morning 95 units at night    Dispense:  20 pen    Refill:  5  . glucose blood test strip    Sig: Use as instructed    Dispense:  100 each    Refill:  12    To check blood sugar tid (Dx: 250.02 - insulin dependent)  . OxyCODONE (OXYCONTIN) 10 mg T12A 12 hr tablet    Sig: Take 1 tablet (10 mg total) by mouth every 12 (twelve) hours.    Dispense:  60 tablet    Refill:  0  . oxyCODONE (OXY IR/ROXICODONE) 5 MG immediate release tablet  Sig: 1 tablet every 8 hours as needed for pain    Dispense:  100 tablet    Refill:  0  . LORazepam (ATIVAN) 0.5 MG tablet    Sig: Take 1 tablet at bedtime prn anxiety.    Dispense:  30 tablet    Refill:  2  . Insulin Pen Needle 31G X 8 MM MISC    Sig: Inject under skin twice a day.    Dispense:  100 each    Refill:  11

## 2013-11-23 DIAGNOSIS — M5137 Other intervertebral disc degeneration, lumbosacral region: Secondary | ICD-10-CM | POA: Diagnosis not present

## 2013-11-23 DIAGNOSIS — M545 Low back pain, unspecified: Secondary | ICD-10-CM | POA: Diagnosis not present

## 2013-11-23 DIAGNOSIS — IMO0002 Reserved for concepts with insufficient information to code with codable children: Secondary | ICD-10-CM | POA: Diagnosis not present

## 2013-11-23 DIAGNOSIS — M546 Pain in thoracic spine: Secondary | ICD-10-CM | POA: Diagnosis not present

## 2013-12-07 DIAGNOSIS — M546 Pain in thoracic spine: Secondary | ICD-10-CM | POA: Diagnosis not present

## 2013-12-07 DIAGNOSIS — M545 Low back pain, unspecified: Secondary | ICD-10-CM | POA: Diagnosis not present

## 2013-12-07 DIAGNOSIS — M5104 Intervertebral disc disorders with myelopathy, thoracic region: Secondary | ICD-10-CM | POA: Diagnosis not present

## 2013-12-11 ENCOUNTER — Other Ambulatory Visit: Payer: Self-pay | Admitting: Family Medicine

## 2013-12-12 NOTE — Telephone Encounter (Signed)
Dr Leward Quan, you have seen pt recently but don't see lab for vit D for a year. Do you want to give RFs?

## 2013-12-12 NOTE — Telephone Encounter (Signed)
Vitamin D refill authorized. Will discuss ongoing supplement need w/ pt at visit this week.

## 2013-12-15 ENCOUNTER — Ambulatory Visit: Payer: Medicare Other | Admitting: Family Medicine

## 2013-12-15 ENCOUNTER — Encounter: Payer: Self-pay | Admitting: Family Medicine

## 2013-12-15 ENCOUNTER — Ambulatory Visit (INDEPENDENT_AMBULATORY_CARE_PROVIDER_SITE_OTHER): Payer: Medicare Other | Admitting: Family Medicine

## 2013-12-15 VITALS — BP 160/65 | HR 91 | Temp 98.0°F | Resp 18 | Ht 70.0 in | Wt 296.8 lb

## 2013-12-15 DIAGNOSIS — IMO0001 Reserved for inherently not codable concepts without codable children: Secondary | ICD-10-CM

## 2013-12-15 DIAGNOSIS — E7800 Pure hypercholesterolemia, unspecified: Secondary | ICD-10-CM

## 2013-12-15 DIAGNOSIS — E1165 Type 2 diabetes mellitus with hyperglycemia: Principal | ICD-10-CM

## 2013-12-15 DIAGNOSIS — G894 Chronic pain syndrome: Secondary | ICD-10-CM

## 2013-12-15 DIAGNOSIS — I10 Essential (primary) hypertension: Secondary | ICD-10-CM | POA: Diagnosis not present

## 2013-12-15 DIAGNOSIS — D509 Iron deficiency anemia, unspecified: Secondary | ICD-10-CM | POA: Diagnosis not present

## 2013-12-15 LAB — COMPLETE METABOLIC PANEL WITH GFR
ALBUMIN: 4.1 g/dL (ref 3.5–5.2)
ALK PHOS: 109 U/L (ref 39–117)
ALT: 13 U/L (ref 0–53)
AST: 13 U/L (ref 0–37)
BUN: 11 mg/dL (ref 6–23)
CO2: 23 mEq/L (ref 19–32)
CREATININE: 0.76 mg/dL (ref 0.50–1.35)
Calcium: 9.3 mg/dL (ref 8.4–10.5)
Chloride: 106 mEq/L (ref 96–112)
GFR, Est African American: >89 mL/min
GLUCOSE: 77 mg/dL (ref 70–99)
Potassium: 3.6 mEq/L (ref 3.5–5.3)
Sodium: 139 mEq/L (ref 135–145)
Total Bilirubin: 0.6 mg/dL (ref 0.2–1.2)
Total Protein: 6.4 g/dL (ref 6.0–8.3)

## 2013-12-15 LAB — CBC WITH DIFFERENTIAL/PLATELET
Basophils Absolute: 0 10*3/uL (ref 0.0–0.1)
Basophils Relative: 0 % (ref 0–1)
EOS ABS: 0.1 10*3/uL (ref 0.0–0.7)
Eosinophils Relative: 2 % (ref 0–5)
HCT: 36.7 % — ABNORMAL LOW (ref 39.0–52.0)
HEMOGLOBIN: 12.7 g/dL — AB (ref 13.0–17.0)
LYMPHS ABS: 2.6 10*3/uL (ref 0.7–4.0)
Lymphocytes Relative: 41 % (ref 12–46)
MCH: 33.2 pg (ref 26.0–34.0)
MCHC: 34.6 g/dL (ref 30.0–36.0)
MCV: 96.1 fL (ref 78.0–100.0)
MONOS PCT: 9 % (ref 3–12)
Monocytes Absolute: 0.6 10*3/uL (ref 0.1–1.0)
NEUTROS ABS: 3 10*3/uL (ref 1.7–7.7)
Neutrophils Relative %: 48 % (ref 43–77)
Platelets: 269 10*3/uL (ref 150–400)
RBC: 3.82 MIL/uL — ABNORMAL LOW (ref 4.22–5.81)
RDW: 16.1 % — ABNORMAL HIGH (ref 11.5–15.5)
WBC: 6.3 10*3/uL (ref 4.0–10.5)

## 2013-12-15 LAB — LIPID PANEL
CHOL/HDL RATIO: 4.3 ratio
CHOLESTEROL: 111 mg/dL (ref 0–200)
HDL: 26 mg/dL — ABNORMAL LOW (ref >39)
LDL Cholesterol: 42 mg/dL (ref 0–99)
TRIGLYCERIDES: 216 mg/dL — AB (ref <150)
VLDL: 43 mg/dL — AB (ref 0–40)

## 2013-12-15 LAB — POCT GLYCOSYLATED HEMOGLOBIN (HGB A1C): Hemoglobin A1C: 9.1

## 2013-12-15 MED ORDER — METFORMIN HCL 500 MG PO TABS: 500.0000 mg | ORAL_TABLET | Freq: Two times a day (BID) | ORAL | Status: DC

## 2013-12-15 MED ORDER — OXYCODONE HCL 5 MG PO TABS: ORAL_TABLET | ORAL | Status: DC

## 2013-12-15 MED ORDER — TRAMADOL HCL 50 MG PO TABS: 100.0000 mg | ORAL_TABLET | Freq: Two times a day (BID) | ORAL | Status: DC | PRN

## 2013-12-15 MED ORDER — OXYCODONE HCL ER 10 MG PO T12A: 10.0000 mg | EXTENDED_RELEASE_TABLET | Freq: Two times a day (BID) | ORAL | Status: DC

## 2013-12-15 NOTE — Progress Notes (Signed)
Quick Note:  Please advise pt regarding following labs... Blood counts have improved but you still have a mild anemia. Contact Dr. Benson Norway (GI specialist) to schedule a follow-up appointment.  Metabolic panel is normal; kidney and liver function tests are normal. Cholesterol numbers are good except HDL ("good") cholesterol is too low. Triglycerides (sugars and other processed foods like [employer]bbie sweets) is above normal. We discussed changes you need to make in your eating habits.  Copy to pt. ______
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
## 2013-12-15 NOTE — Patient Instructions (Signed)
MEDICATIONS changes:  Decrease Insulin to 100 units in the mornings and 80 units in the evenings.  Continue to use  Apidra for high glucose readings.  I am adding Metformin 500 mg 1 tablet twice a day with main meals. CHECK your sugars at least once a day. You may need to check twice a day for the first few weeks since we have changed medications.  Stop eating [employer] snacks, drink more water and stop using Splenda or other artificial sweeteners.

## 2013-12-28 ENCOUNTER — Ambulatory Visit: Payer: Medicare Other | Admitting: Cardiovascular Disease

## 2013-12-28 DIAGNOSIS — E1139 Type 2 diabetes mellitus with other diabetic ophthalmic complication: Secondary | ICD-10-CM | POA: Diagnosis not present

## 2013-12-28 DIAGNOSIS — E11329 Type 2 diabetes mellitus with mild nonproliferative diabetic retinopathy without macular edema: Secondary | ICD-10-CM | POA: Diagnosis not present

## 2013-12-28 DIAGNOSIS — Z961 Presence of intraocular lens: Secondary | ICD-10-CM | POA: Diagnosis not present

## 2013-12-28 LAB — HM DIABETES EYE EXAM

## 2013-12-30 ENCOUNTER — Encounter: Payer: Self-pay | Admitting: *Deleted

## 2013-12-30 DIAGNOSIS — E11319 Type 2 diabetes mellitus with unspecified diabetic retinopathy without macular edema: Secondary | ICD-10-CM

## 2014-01-02 DIAGNOSIS — M545 Low back pain, unspecified: Secondary | ICD-10-CM | POA: Diagnosis not present

## 2014-01-02 DIAGNOSIS — M546 Pain in thoracic spine: Secondary | ICD-10-CM | POA: Diagnosis not present

## 2014-01-02 DIAGNOSIS — M5104 Intervertebral disc disorders with myelopathy, thoracic region: Secondary | ICD-10-CM | POA: Diagnosis not present

## 2014-01-06 ENCOUNTER — Encounter: Payer: Self-pay | Admitting: Cardiovascular Disease

## 2014-01-06 ENCOUNTER — Ambulatory Visit (INDEPENDENT_AMBULATORY_CARE_PROVIDER_SITE_OTHER): Payer: Medicare Other | Admitting: Cardiovascular Disease

## 2014-01-06 VITALS — BP 122/62 | HR 72 | Ht 70.0 in | Wt 288.0 lb

## 2014-01-06 DIAGNOSIS — I2510 Atherosclerotic heart disease of native coronary artery without angina pectoris: Secondary | ICD-10-CM
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
## 2014-01-06 NOTE — Patient Instructions (Signed)
Your physician recommends that you schedule a follow-up appointment as needed with Dr Cooper.   Your physician recommends that you continue on your current medications as directed. Please refer to the Current Medication list given to you today.  

## 2014-01-11 ENCOUNTER — Telehealth: Payer: Self-pay

## 2014-01-11 MED ORDER — OXYCODONE HCL 5 MG PO TABS: ORAL_TABLET | ORAL | Status: DC

## 2014-01-11 NOTE — Telephone Encounter (Signed)
RX: Oxycodone 5 MG IR  # 100 w/ no refill printed at 104. Pt will not be able to get it filled before Jan 15, 2014. RX will be at 102 for pick-up.

## 2014-01-11 NOTE — Telephone Encounter (Signed)
Refill  oxyCODONE (OXY IR/ROXICODONE) 5 MG immediate release tablet   [phone]

## 2014-01-11 NOTE — Telephone Encounter (Signed)
Spoke with pt to let him know that this was ready and he said that he also needed a rx for Oxcontin 10. Please call when ready for p/u

## 2014-01-13 ENCOUNTER — Other Ambulatory Visit: Payer: Self-pay | Admitting: Family Medicine

## 2014-01-13 MED ORDER — OXYCODONE HCL ER 10 MG PO T12A: 10.0000 mg | EXTENDED_RELEASE_TABLET | Freq: Two times a day (BID) | ORAL | Status: DC

## 2014-01-13 NOTE — Progress Notes (Signed)
Spike to patient.  RX ready for pickup.

## 2014-01-17 DIAGNOSIS — M546 Pain in thoracic spine: Secondary | ICD-10-CM | POA: Diagnosis not present

## 2014-01-17 DIAGNOSIS — M5104 Intervertebral disc disorders with myelopathy, thoracic region: Secondary | ICD-10-CM | POA: Diagnosis not present

## 2014-01-29 ENCOUNTER — Other Ambulatory Visit: Payer: Self-pay | Admitting: Family Medicine

## 2014-02-14 ENCOUNTER — Ambulatory Visit (INDEPENDENT_AMBULATORY_CARE_PROVIDER_SITE_OTHER): Payer: Medicare Other | Admitting: Family Medicine

## 2014-02-14 ENCOUNTER — Encounter: Payer: Self-pay | Admitting: Family Medicine

## 2014-02-14 VITALS — BP 154/67 | HR 84 | Temp 98.5°F | Resp 16 | Ht 70.0 in | Wt 283.0 lb

## 2014-02-14 DIAGNOSIS — IMO0001 Reserved for inherently not codable concepts without codable children: Secondary | ICD-10-CM | POA: Diagnosis not present

## 2014-02-14 DIAGNOSIS — S30860A Insect bite (nonvenomous) of lower back and pelvis, initial encounter: Secondary | ICD-10-CM

## 2014-02-14 DIAGNOSIS — J029 Acute pharyngitis, unspecified: Secondary | ICD-10-CM

## 2014-02-14 DIAGNOSIS — W57XXXA Bitten or stung by nonvenomous insect and other nonvenomous arthropods, initial encounter: Secondary | ICD-10-CM

## 2014-02-14 DIAGNOSIS — G894 Chronic pain syndrome: Secondary | ICD-10-CM

## 2014-02-14 DIAGNOSIS — E1165 Type 2 diabetes mellitus with hyperglycemia: Secondary | ICD-10-CM

## 2014-02-14 MED ORDER — OXYCODONE HCL ER 10 MG PO T12A: 10.0000 mg | EXTENDED_RELEASE_TABLET | Freq: Two times a day (BID) | ORAL | Status: DC

## 2014-02-14 MED ORDER — DOXYCYCLINE HYCLATE 100 MG PO TABS: 100.0000 mg | ORAL_TABLET | Freq: Two times a day (BID) | ORAL | Status: DC

## 2014-02-14 MED ORDER — OXYCODONE HCL 5 MG PO TABS: ORAL_TABLET | ORAL | Status: DC

## 2014-02-14 NOTE — Patient Instructions (Signed)
Tick Bite Information °Ticks are insects that attach themselves to the skin. There are many types of ticks. Common types include wood ticks and deer ticks. Sometimes, ticks carry diseases that can make a person very ill. The most common places for ticks to attach themselves are the scalp, neck, armpits, waist, and groin.  °HOW CAN YOU PREVENT TICK BITES? °Take these steps to help prevent tick bites when you are outdoors: °· Wear long sleeves and long pants. °· Wear white clothes so you can see ticks more easily. °· Tuck your pant legs into your socks. °· If walking on a trail, stay in the middle of the trail to avoid brushing against bushes. °· Avoid walking through areas with long grass. °· Put bug spray on all skin that is showing and along boot tops, pant legs, and sleeve cuffs. °· Check clothes, hair, and skin often and before going inside. °· Brush off any ticks that are not attached. °· Take a shower or bath as soon as possible after being outdoors. °HOW SHOULD YOU REMOVE A TICK? °Ticks should be removed as soon as possible to help prevent diseases. °1. If latex gloves are available, put them on before trying to remove a tick. °2. Use tweezers to grasp the tick as close to the skin as possible. You may also use curved forceps or a tick removal tool. Grasp the tick as close to its head as possible. Avoid grasping the tick on its body. °3. Pull gently upward until the tick lets go. Do not twist the tick or jerk it suddenly. This may break off the tick's head or mouth parts. °4. Do not squeeze or crush the tick's body. This could force disease-carrying fluids from the tick into your body. °5. After the tick is removed, wash the bite area and your hands with soap and water or alcohol. °6. Apply a small amount of antiseptic cream or ointment to the bite site. °7. Wash any tools that were used. °Do not try to remove a tick by applying a hot match, petroleum jelly, or fingernail polish to the tick. These methods do  not work. They may also increase the chances of disease being spread from the tick bite. °WHEN SHOULD YOU SEEK HELP? °Contact your health care provider if you are unable to remove a tick or if a part of the tick breaks off in the skin. °After a tick bite, you need to watch for signs and symptoms of diseases that can be spread by ticks. Contact your health care provider if you develop any of the following: °· Fever. °· Rash. °· Redness and puffiness (swelling) in the area of the tick bite. °· Tender, puffy lymph glands. °· Watery poop (diarrhea). °· Weight loss. °· Cough. °· Feeling more tired than normal (fatigue). °· Muscle, joint, or bone pain. °· Belly (abdominal) pain. °· Headache. °· Change in your level of consciousness. °· Trouble walking or moving your legs. °· Loss of feeling (numbness) in the legs. °· Loss of movement (paralysis). °· Shortness of breath. °· Confusion. °· Throwing up (vomiting) many times. °Document Released: 11/26/2009 Document Revised: 05/04/2013 Document Reviewed: 02/09/2013 °[employer]® Patient Information ©2014 [employer]. ° °

## 2014-02-14 NOTE — Progress Notes (Signed)
S:  This 63 y.o. Cauc male has chronic thoracic spine pain, currently being treated by spine specialist. Pt has been told he is not a surgical candidate; injections are ineffective. Pt still requires pain medications daily. Due to current pain level, we have not be able to wean his doses; he reports needing to increase oxycodone 5 mg IR back to 1 tab every 6 hours.  He reports his wife pulling a small engorged deer tick off last week. A small reddened area was on his back at the site but thathas cleared. He has no fever/chills, rash, GI upset, myalgias or HA. All family members and pets are not well; pt c/o sore throat and nasal congestion; he denies worsening reflux symptoms or swallowing difficulties.  DM- pt is compliant w/ medications. He reports one episode of BS. 500; sugar came down to < 200 w/ Apidra injection. He does not recall any change in diet or medications. Pt reports eating less; he no longer snacks on [employer]bbie pastries.  Patient Active Problem List   Diagnosis Date Noted  . Diabetic retinopathy 12/30/2013  . Gastroesophageal reflux disease with hiatal hernia 09/23/2013  . Chronic pain syndrome 09/16/2013  . 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 Disturbance of skin sensation 03/25/2012  . Sleep apnea 02/20/2012  . 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 CAD (coronary artery disease) 11/25/2011  . Encounter for long-term (current) use of other medications 03/03/2011  . 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 SHOULDER PAIN, BILATERAL 01/10/2010  . UNSPECIFIED PERIPHERAL VASCULAR DISEASE 11/12/2009  . ESOPHAGITIS 11/12/2009  . ANEMIA, IRON DEFICIENCY 10/11/2009  . DEPRESSION 05/17/2009  . HYPERCHOLESTEROLEMIA 09/18/2008  . PRIMARY HYPERPARATHYROIDISM 01/21/2008  . DDD (degenerative disc disease), lumbosacral 01/21/2008  . TYPE II or UNSPECIFIED TYPE DIABETES without mention of complication, uncontrolled 03/29/2007  . HYPERTENSION 03/29/2007   Prior to Admission  medications   Medication Sig Start Date End Date Taking? Authorizing Provider  aspirin EC 81 MG tablet Take 81 mg by mouth daily. ON HOLD AS OF 01-06-14 12/03/11  Yes Sherren Mocha, MD  furosemide (LASIX) 40 MG tablet TAKE 1 TABLET BY MOUTH EVERY DAY 09/12/13  Yes Sherren Mocha, MD  glucose blood (FREESTYLE LITE) test strip Check blood sugar three times a day dx 250.01 10/27/12  Yes Theda Sers, PA-C  glucose blood test strip Use as instructed 11/16/13  Yes Barton Fanny, MD  insulin glulisine (APIDRA) 100 UNIT/ML injection Inject 10 Units into the skin as needed. 04/20/13  Yes Barton Fanny, MD  Insulin Lispro Prot & Lispro (HUMALOG 50/50 MIX) (50-50) 100 UNIT/ML Kwikpen Use 125 units in morning 95 units at night 11/16/13  Yes Barton Fanny, MD  Insulin Pen Needle 31G X 8 MM MISC Inject under skin twice a day. 11/16/13  Yes Barton Fanny, MD  LORazepam (ATIVAN) 0.5 MG tablet Take 1 tablet at bedtime prn anxiety. 11/16/13  Yes Barton Fanny, MD  meloxicam (MOBIC) 15 MG tablet Take 15 mg by mouth daily.   Yes Historical Provider, MD  metFORMIN (GLUCOPHAGE) 500 MG tablet Take 1 tablet (500 mg total) by mouth 2 (two) times daily with a meal. 12/15/13  Yes Barton Fanny, MD  metoCLOPramide (REGLAN) 10 MG tablet TAKE 1 TABLET BY MOUTH THREE TIMES DAILY BEFORE A MEAL   Yes Barton Fanny, MD  omeprazole (PRILOSEC) 20 MG capsule Take 20 mg by mouth 2 (two) times daily before a meal.   Yes Historical Provider, MD  ondansetron (ZOFRAN) 8 MG tablet TAKE AS NEEDED 09/14/11  Yes Renato Shin, MD  oxyCODONE (OXY IR/ROXICODONE) 5 MG immediate release tablet Take 1 tablet every 8hours as needed for pain.   Yes Barton Fanny, MD  OxyCODONE (OXYCONTIN) 10 mg T12A 12 hr tablet Take 1 tablet (10 mg total) by mouth every 12 (twelve) hours.   Yes Barton Fanny, MD  pregabalin (LYRICA) 100 MG capsule Take 1 capsule (100 mg total) by mouth 3 (three) times daily. 06/24/13  Yes  Barton Fanny, MD  sertraline (ZOLOFT) 100 MG tablet Take 1 tablet (100 mg total) by mouth daily. 04/20/13  Yes Barton Fanny, MD  silver sulfADIAZINE (SILVADENE) 1 % cream Apply topically as needed. 06/08/12  Yes Posey Boyer, MD  simvastatin (ZOCOR) 40 MG tablet Take 1 tablet (40 mg total) by mouth at bedtime. 07/28/12  Yes Barton Fanny, MD  sucralfate (CARAFATE) 1 GM/10ML suspension Take 1 g by mouth 4 (four) times daily. As needed    Yes Historical Provider, MD  traMADol (ULTRAM) 50 MG tablet Take 2 tablets (100 mg total) by mouth every 12 (twelve) hours as needed. 12/15/13  Yes Barton Fanny, MD  traZODone (DESYREL) 100 MG tablet Take 1 tablet (100 mg total) by mouth at bedtime. 04/20/13  Yes Barton Fanny, MD  triamcinolone cream (KENALOG) 0.1 % Apply topically 3 (three) times daily. As needed for itching 04/20/12  Yes Barton Fanny, MD  Vitamin D, Ergocalciferol, (DRISDOL) 50000 UNITS CAPS capsule Take 1 capsule (50,000 Units total) by mouth every 7 (seven) days.   Yes Barton Fanny, MD  zolpidem (AMBIEN) 10 MG tablet Take 1 tablet (10 mg total) by mouth at bedtime as needed for sleep. 04/20/13  Yes Barton Fanny, MD          PMHx, Surg Hx, Soc and Fam Hx reviewed.  ROS: As per HPI.  O: Filed Vitals:   02/14/14 1311  BP: 154/67    (BP normal at cardiology visit in April)  Pulse: 84  Temp: 98.5 F (36.9 C)  Resp: 16   GEN: In NAD; WN,WD. Weight down 14 lbs. HENT: [place]/AT; EOMI w/ clear conj/sclerae. Nares with clear/cloudy discharge. Posterior pharynx erythematous w/o exudate or lesions. LUNGS: CTA; normal resp rate and effort. COR: RRR. SKIN; Back- chronic scarring but no discrete erythematous lesions or ulcerations. NEURO: A&O x 3; CNs intact. Pt declines to remove shoe from L foot for DM foot exam.  A/P: Chronic pain syndrome- Continue current medications. Offered to refer pt to spine specialist in [place], [place] for 2nd opinion. He  prefers to wait until after next scheduled visit with current treating spine specilaist to discuss next step in treatment plan.  Tick bite of back- Doxycycline 100 mg 1 tab bid.  TYPE II or UNSPECIFIED TYPE DIABETES without mention of complication, uncontrolled- Pt has improved nutrition but still has some lability in sugars. Will check A1c at next visit.  Acute pharyngitis- Suspect acute viral illness.  Meds ordered this encounter  Medications  . oxyCODONE (OXY IR/ROXICODONE) 5 MG immediate release tablet    Sig: Take 1 tablet every 6 hours as needed for pain.    Dispense:  120 tablet    Refill:  0  . OxyCODONE (OXYCONTIN) 10 mg T12A 12 hr tablet    Sig: Take 1 tablet (10 mg total) by mouth every 12 (twelve) hours.    Dispense:  60 tablet    Refill:  0  . doxycycline (VIBRA-TABS) 100 MG tablet    Sig: Take 1 tablet (100 mg total) by mouth 2 (two) times daily.    Dispense:  20 tablet    Refill:  0

## 2014-03-16 ENCOUNTER — Encounter: Payer: Self-pay | Admitting: Family Medicine

## 2014-03-16 ENCOUNTER — Ambulatory Visit (INDEPENDENT_AMBULATORY_CARE_PROVIDER_SITE_OTHER): Payer: Medicare Other | Admitting: Family Medicine

## 2014-03-16 VITALS — BP 123/67 | HR 83 | Temp 98.3°F | Resp 18 | Ht 70.0 in | Wt 286.0 lb

## 2014-03-16 DIAGNOSIS — K449 Diaphragmatic hernia without obstruction or gangrene: Secondary | ICD-10-CM

## 2014-03-16 DIAGNOSIS — M4324 Fusion of spine, thoracic region: Secondary | ICD-10-CM

## 2014-03-16 DIAGNOSIS — IMO0001 Reserved for inherently not codable concepts without codable children: Secondary | ICD-10-CM

## 2014-03-16 DIAGNOSIS — IMO0002 Reserved for concepts with insufficient information to code with codable children: Secondary | ICD-10-CM | POA: Diagnosis not present

## 2014-03-16 DIAGNOSIS — G894 Chronic pain syndrome: Secondary | ICD-10-CM | POA: Diagnosis not present

## 2014-03-16 DIAGNOSIS — M539 Dorsopathy, unspecified: Secondary | ICD-10-CM | POA: Diagnosis not present

## 2014-03-16 DIAGNOSIS — K219 Gastro-esophageal reflux disease without esophagitis: Secondary | ICD-10-CM

## 2014-03-16 DIAGNOSIS — I2510 Atherosclerotic heart disease of native coronary artery without angina pectoris: Secondary | ICD-10-CM

## 2014-03-16 DIAGNOSIS — M5134 Other intervertebral disc degeneration, thoracic region: Secondary | ICD-10-CM

## 2014-03-16 DIAGNOSIS — E1165 Type 2 diabetes mellitus with hyperglycemia: Principal | ICD-10-CM

## 2014-03-16 LAB — POCT GLYCOSYLATED HEMOGLOBIN (HGB A1C): Hemoglobin A1C: 8.1

## 2014-03-16 MED ORDER — TRAMADOL HCL 50 MG PO TABS: 100.0000 mg | ORAL_TABLET | Freq: Two times a day (BID) | ORAL | Status: DC | PRN

## 2014-03-16 MED ORDER — LORAZEPAM 0.5 MG PO TABS: ORAL_TABLET | ORAL | Status: DC

## 2014-03-16 MED ORDER — OXYCODONE HCL ER 10 MG PO T12A: 10.0000 mg | EXTENDED_RELEASE_TABLET | Freq: Two times a day (BID) | ORAL | Status: DC

## 2014-03-16 MED ORDER — OXYCODONE HCL 5 MG PO TABS: ORAL_TABLET | ORAL | Status: DC

## 2014-03-16 NOTE — Progress Notes (Signed)
Subjective:    Patient ID: [name], male    DOB: Mar 24, 1951, 63 y.o.   MRN: [id number]  HPI  This 63 y.o. Cauc male has uncontrolled Type II DM; he is better about FSBS and nutrition.  No documented values available. Is compliant with medications. Denies hypoglycemia, increased appetite or polyuria. Weight is unchanged in 3 months. Pt unable to exercise due to chronic back pain.  Pt continues to deal with chronic pain in thoracic spine; Dr. Patrice Paradise has tried injections w/o success. Pt relies on oral narcotics for relief. Surgical option was explained by Dr. Patrice Paradise but pt is adamant about avoiding further surgeries. He is interested in a second opinion; I have recommended a specialist at [facility]. Pt refuses to consider returning to Pain Management at this time; he thinks it is costly and the services provided were inadequate (he was being seen at [facility]). Pt states that everyone says "the pain clinics are all connected" so the treatment is the same everywhere.  Pt states that Dr. Patrice Paradise has offered to take over management of his pain medications.  Pt has R lower prothesis that is ill-fitting; a calloused area has developed on his anterior knee. He has applying Neosporin to area. He plans to contact the person who supplies his sleeves and fitted his prosthesis to have a new one made.  Pt reports last colonoscopy was at [facility]; Dr. Benson Norway did EGD 09/02/2012. Colonoscopy was ordered by another provider in 2011 but pt did not have the procedure done in that year. Pt has stated on several occasions that he needs to call GI office to schedule follow-up; His GERD symptoms seem to be unresponsive to current medications.  Review of Systems  Constitutional: Negative for fatigue.  HENT: Positive for trouble swallowing.   Eyes: Negative for visual disturbance.       Eye exam in 2014; wears corrective lenses.  Respiratory: Negative.   Gastrointestinal: Positive for nausea and  abdominal distention.       Chronic GERD w/ HH.  Musculoskeletal: Positive for back pain and gait problem.  Neurological: Negative.   Psychiatric/Behavioral:       Chronic pain >> irritability, depression, sleep disturbance.      Objective:   Physical Exam  Nursing note and vitals reviewed. Constitutional: He is oriented to person, place, and time. Vital signs are normal. He appears well-developed and well-nourished. No distress.  Appears chronically ill.  HENT:  Head: Normocephalic and atraumatic.  Eyes: Conjunctivae and EOM are normal. Pupils are equal, round, and reactive to light. No scleral icterus.  Cardiovascular: Normal rate.   Pulmonary/Chest: Effort normal. No respiratory distress.  Abdominal: He exhibits distension.  Truncal obesity.  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 Stump is erythematous w/o signs of infection; callous over anterior patella area.  Neurological: He is alert and oriented to person, place, and time. No cranial nerve deficit.  Skin: Skin is warm and dry.  Psychiatric: His speech is normal. Thought content normal. He is agitated. He is not aggressive and not combative.  Pt voices frustration about the situation w/ his back. He was angered w/ the discussion re: possible referral back to Pain Management.  He was somewhat impulsive in dismissal of treatment options.    Results for orders placed in visit on 03/16/14  POCT GLYCOSYLATED HEMOGLOBIN (HGB A1C)      Result Value Ref Range   Hemoglobin A1C 8.1  Assessment & Plan:  TYPE II or UNSPECIFIED TYPE DIABETES without mention of complication, uncontrolled - Improved control. COntinue same medications. Plan: POCT glycosylated hemoglobin (Hb A1C)  Chronic pain syndrome- Brief but unsuccessful discussion about return to Pain Management. Will continue current medications.  DDD (degenerative disc disease), thoracic- Pt agrees to referral to specialist at  [facility]. I will refer  pt to Dr. Ancil Linsey- [facility]  Ph # [phone].  Fusion of spine of thoracic region  Gastroesophageal reflux disease with hiatal hernia- Pt encouraged to contact Dr. Ulyses Amor office for follow-up.  Meds ordered this encounter  Medications  . LORazepam (ATIVAN) 0.5 MG tablet    Sig: Take 1 tablet at bedtime prn anxiety.    Dispense:  30 tablet    Refill:  2  . oxyCODONE (OXY IR/ROXICODONE) 5 MG immediate release tablet    Sig: Take 1 tablet every 6 hours as needed for pain.    Dispense:  120 tablet    Refill:  0  . OxyCODONE (OXYCONTIN) 10 mg T12A 12 hr tablet    Sig: Take 1 tablet (10 mg total) by mouth every 12 (twelve) hours.    Dispense:  60 tablet    Refill:  0  . traMADol (ULTRAM) 50 MG tablet    Sig: Take 2 tablets (100 mg total) by mouth every 12 (twelve) hours as needed.    Dispense:  100 tablet    Refill:  0

## 2014-03-16 NOTE — Patient Instructions (Signed)
We contacted Dr. Ulyses Amor office. You have had an upper endoscopy but not a colonoscopy. You need to get these studies scheduled with his office to satisfy your health maintenance profile for your insurance company.

## 2014-03-17 ENCOUNTER — Encounter: Payer: Self-pay | Admitting: Family Medicine

## 2014-03-24 ENCOUNTER — Other Ambulatory Visit: Payer: Self-pay | Admitting: Family Medicine

## 2014-03-29 ENCOUNTER — Telehealth: Payer: Self-pay

## 2014-03-29 ENCOUNTER — Other Ambulatory Visit: Payer: Self-pay | Admitting: Family Medicine

## 2014-03-29 MED ORDER — MELOXICAM 15 MG PO TABS: 15.0000 mg | ORAL_TABLET | Freq: Every day | ORAL | Status: DC

## 2014-03-29 MED ORDER — PREGABALIN 100 MG PO CAPS: ORAL_CAPSULE | ORAL | Status: DC

## 2014-03-29 NOTE — Telephone Encounter (Signed)
Lyrica refill phoned to pt's pharmacy.

## 2014-03-29 NOTE — Telephone Encounter (Signed)
Patient states [facility] on  [address] sent request for meloxicam (MOBIC) 15 MG tablet  and LYRICA 100 MG capsule   [phone]

## 2014-03-31 ENCOUNTER — Telehealth: Payer: Self-pay

## 2014-03-31 DIAGNOSIS — G894 Chronic pain syndrome: Secondary | ICD-10-CM

## 2014-03-31 NOTE — Telephone Encounter (Signed)
Patient needs refills for prostate medication and medication for stomach that he takes 3x a day ... States stomach medication bottle may have fallen in the trash can. Please return call and advise. States pharmacy told him they could fill it at the end of the month but he does not want to do that.  CB #: [phone]

## 2014-03-31 NOTE — Telephone Encounter (Signed)
Metoclopramide (3x/day medication) was refilled on 03/29/14. I am not sure which medication is for "prostate"; is it an antibiotic?

## 2014-04-01 NOTE — Telephone Encounter (Signed)
Pt picked up the Reglan yesterday. He needs a rx for a prosthetic sleeve for his leg. wants to know if we can rx 2 please. Thanks

## 2014-04-02 MED ORDER — KNEE SLEEVE/MEDIUM MISC: 1.0000 [IU] | Status: DC | PRN

## 2014-04-02 NOTE — Telephone Encounter (Signed)
Prescription for prosthetic sleeves is at 104 for pick-up.

## 2014-04-06 NOTE — Telephone Encounter (Signed)
Pt already picked up

## 2014-04-12 ENCOUNTER — Telehealth: Payer: Self-pay

## 2014-04-12 NOTE — Telephone Encounter (Signed)
Pt called in for Dr Leward Quan for refills for his Oxyicodone and Oxycontin. He can be reached @ [phone]. Thank you

## 2014-04-13 MED ORDER — OXYCODONE HCL ER 10 MG PO T12A: 10.0000 mg | EXTENDED_RELEASE_TABLET | Freq: Two times a day (BID) | ORAL | Status: DC

## 2014-04-13 MED ORDER — OXYCODONE HCL 5 MG PO TABS: ORAL_TABLET | ORAL | Status: DC

## 2014-04-13 NOTE — Telephone Encounter (Signed)
Medication refills have been printed and are at [address] [facility] for pick-up on Friday, April 14, 2014.

## 2014-04-14 NOTE — Telephone Encounter (Signed)
Called pt to advise

## 2014-04-18 ENCOUNTER — Other Ambulatory Visit: Payer: Self-pay | Admitting: Family Medicine

## 2014-04-18 ENCOUNTER — Other Ambulatory Visit: Payer: Self-pay | Admitting: Cardiovascular Disease

## 2014-04-18 DIAGNOSIS — M546 Pain in thoracic spine: Secondary | ICD-10-CM | POA: Diagnosis not present

## 2014-04-19 NOTE — Telephone Encounter (Signed)
Trazodone refilled for 90 days w/ 1 additional refill.

## 2014-04-19 NOTE — Telephone Encounter (Signed)
Dr Leward Quan, pt was in to see you in July, but I don't see this med addressed for some time. OK to RF?

## 2014-04-24 ENCOUNTER — Other Ambulatory Visit: Payer: Self-pay | Admitting: Gastroenterology

## 2014-04-24 DIAGNOSIS — R112 Nausea with vomiting, unspecified: Secondary | ICD-10-CM | POA: Diagnosis not present

## 2014-04-24 DIAGNOSIS — R1310 Dysphagia, unspecified: Secondary | ICD-10-CM | POA: Diagnosis not present

## 2014-04-24 DIAGNOSIS — K219 Gastro-esophageal reflux disease without esophagitis: Secondary | ICD-10-CM | POA: Diagnosis not present

## 2014-04-27 ENCOUNTER — Other Ambulatory Visit: Payer: Medicare Other

## 2014-04-29 ENCOUNTER — Other Ambulatory Visit: Payer: Self-pay | Admitting: Family Medicine

## 2014-05-01 ENCOUNTER — Ambulatory Visit
Admission: RE | Admit: 2014-05-01 | Discharge: 2014-05-01 | Disposition: A | Discharge status: Home / Self Care | Payer: Medicare Other | Source: Ambulatory Visit | Attending: Gastroenterology | Admitting: Gastroenterology

## 2014-05-01 DIAGNOSIS — K209 Esophagitis, unspecified without bleeding: Secondary | ICD-10-CM | POA: Diagnosis not present

## 2014-05-01 DIAGNOSIS — R1310 Dysphagia, unspecified: Secondary | ICD-10-CM

## 2014-05-15 ENCOUNTER — Other Ambulatory Visit: Payer: Self-pay | Admitting: Family Medicine

## 2014-05-15 ENCOUNTER — Telehealth: Payer: Self-pay

## 2014-05-15 NOTE — Telephone Encounter (Signed)
Pt in need of his Oxycodone 10mg s. Please call [phone] when ready for pick up

## 2014-05-16 MED ORDER — OXYCODONE HCL ER 10 MG PO T12A: 10.0000 mg | EXTENDED_RELEASE_TABLET | Freq: Two times a day (BID) | ORAL | Status: DC

## 2014-05-16 NOTE — Telephone Encounter (Signed)
Oxycodone RX printed and at [address] for pick-up on 05/17/2014 (Wednesday). Signature required.

## 2014-05-17 ENCOUNTER — Other Ambulatory Visit: Payer: Self-pay | Admitting: Cardiovascular Disease

## 2014-05-17 ENCOUNTER — Other Ambulatory Visit: Payer: Self-pay | Admitting: Family Medicine

## 2014-05-17 NOTE — Telephone Encounter (Signed)
Pt advised.

## 2014-05-17 NOTE — Telephone Encounter (Signed)
Dr Leward Quan, Ok to RF these? At July OV you encouraged pt to see his GI, so wasn't sure you wanted to RF the reglan? Also don't see any recent labs or discussions about vit D, soneeded to check first.

## 2014-05-17 NOTE — Telephone Encounter (Signed)
Patient came in to pick up his Oxycontin 10mg  prescription, he is also asking for Oxycodone, please advise

## 2014-05-18 MED ORDER — OXYCODONE HCL 5 MG PO TABS: ORAL_TABLET | ORAL | Status: DC

## 2014-05-18 MED ORDER — VITAMIN D (ERGOCALCIFEROL) 1.25 MG (50000 UNIT) PO CAPS: ORAL_CAPSULE | ORAL | Status: DC

## 2014-05-18 NOTE — Telephone Encounter (Signed)
Pt reported that he has seen Dr Benson Norway and he didn't tell the pt that he wanted to change any medications. He reported that he had a barium swallow test done and was advised "everything was all right". Pt agreed to make appt to see Dr Leward Quan w/in the next two months. I called Dr Ulyses Amor office and req'd fax of [place] notes w/Dr Benson Norway.

## 2014-05-18 NOTE — Telephone Encounter (Signed)
I will refill the Reglan for 2 months. He must follow- through on GI follow-up. Will need to check Vitamin D but will refill it for 2 months also.

## 2014-05-18 NOTE — Telephone Encounter (Signed)
Oxycodone 5 mg RX printed and ready for pick up at [facility] (signature required).

## 2014-05-23 NOTE — Telephone Encounter (Signed)
Pt called to check status of this Rx. Checked w/Becky and then advised pt oxycodone Rx is ready for p/up.

## 2014-06-01 DIAGNOSIS — Z23 Encounter for immunization: Secondary | ICD-10-CM | POA: Diagnosis not present

## 2014-06-05 ENCOUNTER — Other Ambulatory Visit: Payer: Self-pay | Admitting: Family Medicine

## 2014-06-07 ENCOUNTER — Telehealth: Payer: Self-pay

## 2014-06-07 NOTE — Telephone Encounter (Signed)
sertraline (ZOLOFT) 100 MG tablet   Patient states he has contacted the pharmacy on Friday and Tuesday for a refill on the above medication.   [phone]

## 2014-06-08 ENCOUNTER — Other Ambulatory Visit: Payer: Self-pay | Admitting: Physician Assistant

## 2014-06-08 MED ORDER — INSULIN LISPRO PROT & LISPRO (50-50 MIX) 100 UNIT/ML KWIKPEN: PEN_INJECTOR | SUBCUTANEOUS | Status: DC

## 2014-06-08 MED ORDER — SERTRALINE HCL 100 MG PO TABS: ORAL_TABLET | ORAL | Status: DC

## 2014-06-08 NOTE — Telephone Encounter (Signed)
Prescription refill was sent 06/06/14 along with the insulin refill. Pt states [facility] had computer issues and they have not received it.  I resent these two medications.

## 2014-06-13 ENCOUNTER — Telehealth: Payer: Self-pay

## 2014-06-13 NOTE — Telephone Encounter (Signed)
Pt states he is in need of his Oxycodone 10mg s and his Oxycodone 5mg s. Pt would like to make sure he gets both. Please call [phone] when ready for pick up

## 2014-06-14 ENCOUNTER — Encounter: Payer: Self-pay | Admitting: Family Medicine

## 2014-06-15 MED ORDER — OXYCODONE HCL ER 10 MG PO T12A: 10.0000 mg | EXTENDED_RELEASE_TABLET | Freq: Two times a day (BID) | ORAL | Status: DC

## 2014-06-15 MED ORDER — OXYCODONE HCL 5 MG PO TABS: ORAL_TABLET | ORAL | Status: DC

## 2014-06-15 NOTE — Telephone Encounter (Signed)
Patient advised.

## 2014-06-15 NOTE — Telephone Encounter (Signed)
Prescriptions are ready for pick-up at [address] building; signature required.

## 2014-06-20 ENCOUNTER — Encounter: Payer: Self-pay | Admitting: Family Medicine

## 2014-06-20 ENCOUNTER — Ambulatory Visit (INDEPENDENT_AMBULATORY_CARE_PROVIDER_SITE_OTHER): Payer: Medicare Other | Admitting: Family Medicine

## 2014-06-20 VITALS — BP 140/68 | HR 83 | Temp 98.2°F | Resp 20 | Ht 70.0 in | Wt 288.4 lb

## 2014-06-20 DIAGNOSIS — G8911 Acute pain due to trauma: Secondary | ICD-10-CM

## 2014-06-20 DIAGNOSIS — IMO0002 Reserved for concepts with insufficient information to code with codable children: Secondary | ICD-10-CM

## 2014-06-20 DIAGNOSIS — M51379 Other intervertebral disc degeneration, lumbosacral region without mention of lumbar back pain or lower extremity pain: Secondary | ICD-10-CM

## 2014-06-20 DIAGNOSIS — M545 Low back pain, unspecified: Secondary | ICD-10-CM

## 2014-06-20 DIAGNOSIS — M5137 Other intervertebral disc degeneration, lumbosacral region: Secondary | ICD-10-CM

## 2014-06-20 DIAGNOSIS — Z23 Encounter for immunization: Secondary | ICD-10-CM | POA: Diagnosis not present

## 2014-06-20 DIAGNOSIS — I10 Essential (primary) hypertension: Secondary | ICD-10-CM

## 2014-06-20 DIAGNOSIS — E1165 Type 2 diabetes mellitus with hyperglycemia: Secondary | ICD-10-CM

## 2014-06-20 DIAGNOSIS — Z9889 Other specified postprocedural states: Secondary | ICD-10-CM

## 2014-06-20 DIAGNOSIS — G894 Chronic pain syndrome: Secondary | ICD-10-CM

## 2014-06-20 DIAGNOSIS — I2510 Atherosclerotic heart disease of native coronary artery without angina pectoris: Secondary | ICD-10-CM

## 2014-06-20 LAB — POCT GLYCOSYLATED HEMOGLOBIN (HGB A1C): HEMOGLOBIN A1C: 8.7

## 2014-06-20 MED ORDER — ZOSTER VACCINE LIVE 19400 UNT/0.65ML ~~LOC~~ SOLR: 0.6500 mL | Freq: Once | SUBCUTANEOUS | Status: DC

## 2014-06-20 MED ORDER — CARISOPRODOL 350 MG PO TABS: 350.0000 mg | ORAL_TABLET | Freq: Every day | ORAL | Status: DC

## 2014-06-20 MED ORDER — PREGABALIN 100 MG PO CAPS: ORAL_CAPSULE | ORAL | Status: DC

## 2014-06-20 NOTE — Progress Notes (Signed)
Subjective:    Patient ID: [name], male    DOB: [birth year], 63 y.o.   MRN: [id number]  HPI  This 63 y.o. Cauc male has Type II DM, last A1c= 8.1% in July 2015. FSBS = 130-160. BS > 500 for 2 days last month but pt cannot recall what could have caused this elevation; he does not follow a meal plan and has no diary of nutrition or activity. He is not taking Apidra with meals but, instead, uses this insulin to bring down high sugars.  Reports Metformin taken as directed. Nutrition changes include eliminating Lil[employer] Debbie snacks and less sodas.   New complaint today is acute right-sided low back pain x 2 weeks. Pt fell of his reiding mower onto R hip. Pain radiates into R buttock and down leg. Pt has contacted Dr. Towanda Malkin office; he has an appt in 2 weeks. Sleep hygiene is poor; pt continues to sleep in  [place]. Re: chronic pain medication- pt is taking Oxycodone as prescribed. Pt reports that Dr. Patrice Paradise has agreed to take over prescribing of controlled substances. Pt states he has been evaluated by surgeon at [facility] who has nothing to offer pt in the way of surgical treatment.    Pt requesting another RX for wheelchair to take to a different DME supplier. Having difficulty w/ increased LBP and stiffness after prolonged sitting. Increasing difficulty w/ ambulation using cane.  HTN- Pt is compliant w/ medication; currently the only medication taken for BP is furosemide. No reports of diaphoresis, CP or tightness, palpitations, SOB or DOE, cough, HA, dizziness, weakness or syncope.   Patient Active Problem List   Diagnosis Date Noted  . Diabetic retinopathy 12/30/2013  . Gastroesophageal reflux disease with hiatal hernia 09/23/2013  . Chronic pain syndrome 09/16/2013  . 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 Disturbance of skin sensation 03/25/2012  . Sleep apnea 02/20/2012  . 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 CAD (coronary artery disease) 11/25/2011  . Encounter for long-term  (current) use of other medications 03/03/2011  . 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 SHOULDER PAIN, BILATERAL 01/10/2010  . UNSPECIFIED PERIPHERAL VASCULAR DISEASE 11/12/2009  . ESOPHAGITIS 11/12/2009  . ANEMIA, IRON DEFICIENCY 10/11/2009  . DEPRESSION 05/17/2009  . HYPERCHOLESTEROLEMIA 09/18/2008  . PRIMARY HYPERPARATHYROIDISM 01/21/2008  . DDD (degenerative disc disease), lumbosacral 01/21/2008  . TYPE II or UNSPECIFIED TYPE DIABETES without mention of complication, uncontrolled 03/29/2007  . Essential hypertension 03/29/2007    Prior to Admission medications   Medication Sig Start Date End Date Taking? Authorizing Provider  aspirin EC 81 MG tablet Take 81 mg by mouth daily. ON HOLD AS OF 01-06-14 12/03/11  Yes Sherren Mocha, MD  Elastic Bandages & Supports (KNEE SLEEVE/MEDIUM) MISC 1 Units by Does not apply route as needed. 04/02/14  Yes Barton Fanny, MD  furosemide (LASIX) 40 MG tablet TAKE 1 TABLET BY MOUTH DAILY 05/18/14  Yes Sherren Mocha, MD  glucose blood (FREESTYLE LITE) test strip Check blood sugar three times a day dx 250.01 10/27/12  Yes Theda Sers, PA-C  glucose blood test strip Use as instructed 11/16/13  Yes Barton Fanny, MD  insulin glulisine (APIDRA) 100 UNIT/ML injection Inject 10 Units into the skin as needed. 04/20/13  Yes Barton Fanny, MD  Insulin Lispro Prot & Lispro (HUMALOG MIX 50/50 KWIKPEN) (50-50) 100 UNIT/ML Kwikpen INJECT 125 UNITS UNDER THE SKIN EVERY MORNING AND 95 UNITS EVERY EVENING 06/08/14  Yes Mancel Bale, PA-C  Insulin Pen  Needle 31G X 8 MM MISC Inject under skin twice a day. 11/16/13  Yes Barton Fanny, MD  LORazepam (ATIVAN) 0.5 MG tablet Take 1 tablet at bedtime prn anxiety. 03/16/14  Yes Barton Fanny, MD  meloxicam (MOBIC) 15 MG tablet Take 1 tablet (15 mg total) by mouth daily. 03/29/14  Yes Barton Fanny, MD  omeprazole (PRILOSEC) 20 MG capsule Take 20 mg by mouth 2 (two) times daily before a meal.   Yes  Historical Provider, MD  ondansetron (ZOFRAN) 8 MG tablet TAKE AS NEEDED 09/14/11  Yes Renato Shin, MD  oxyCODONE (OXY IR/ROXICODONE) 5 MG immediate release tablet Take 1 tablet every 6 hours as needed for pain. 06/15/14  Yes Barton Fanny, MD  OxyCODONE (OXYCONTIN) 10 mg T12A 12 hr tablet Take 1 tablet (10 mg total) by mouth every 12 (twelve) hours. 06/15/14  Yes Barton Fanny, MD  pregabalin (LYRICA) 100 MG capsule TAKE 1 CAPSULE BY MOUTH THREE TIMES DAILY   Yes Barton Fanny, MD  sertraline (ZOLOFT) 100 MG tablet TAKE 1 TABLET BY MOUTH DAILY 06/08/14  Yes Mancel Bale, PA-C  silver sulfADIAZINE (SILVADENE) 1 % cream Apply topically as needed. 06/08/12  Yes Posey Boyer, MD  simvastatin (ZOCOR) 40 MG tablet Take 1 tablet (40 mg total) by mouth at bedtime. 07/28/12  Yes Barton Fanny, MD  sucralfate (CARAFATE) 1 GM/10ML suspension Take 1 g by mouth 4 (four) times daily. As needed    Yes Historical Provider, MD  traMADol (ULTRAM) 50 MG tablet Take 2 tablets (100 mg total) by mouth every 12 (twelve) hours as needed. 03/16/14  Yes Barton Fanny, MD  traZODone (DESYREL) 100 MG tablet TAKE 1 TABLET BY MOUTH AT BEDTIME   Yes Barton Fanny, MD  triamcinolone cream (KENALOG) 0.1 % Apply topically 3 (three) times daily. As needed for itching 04/20/12  Yes Barton Fanny, MD  Vitamin D, Ergocalciferol, (DRISDOL) 50000 UNITS CAPS capsule TAKE ONE CAPSULE BY MOUTH EVERY 7 DAYS 05/18/14  Yes Barton Fanny, MD  zolpidem (AMBIEN) 10 MG tablet Take 1 tablet (10 mg total) by mouth at bedtime as needed for sleep. 04/20/13  Yes Barton Fanny, MD  APIDRA SOLOSTAR 100 UNIT/ML Solostar Pen INJECT 10 UNITS INTO THE SKIN THREE TIMES DAILY BEFORE MEALS 05/01/14   Barton Fanny, MD  metFORMIN (GLUCOPHAGE) 500 MG tablet Take 1 tablet (500 mg total) by mouth 2 (two) times daily with a meal. 12/15/13   Barton Fanny, MD    [facility] and [facility] Hx reviewed.   Review of Systems    Constitutional: Positive for appetite change. Negative for fever and diaphoresis.       Appetite is poor to fair.  Eyes: Negative for visual disturbance.  Respiratory: Negative.   Cardiovascular: Negative.   Musculoskeletal: Positive for arthralgias, back pain, gait problem and myalgias.  Neurological: Negative for dizziness, syncope, weakness, light-headedness and headaches.       Objective:   Physical Exam  Nursing note and vitals reviewed. Constitutional: He is oriented to person, place, and time. He appears well-developed and well-nourished. No distress.  HENT:  Head: Normocephalic and atraumatic.  Right Ear: External ear normal.  Left Ear: External ear normal.  Mouth/Throat: Oropharynx is clear and moist.  Eyes: Conjunctivae and EOM are normal. Pupils are equal, round, and reactive to light. No scleral icterus.  Cardiovascular: Normal rate and regular rhythm.   Pulmonary/Chest: Effort normal. No respiratory distress.  Musculoskeletal:  Thoracic back: He exhibits tenderness and spasm. He exhibits no swelling and no deformity.       Lumbar back: He exhibits tenderness, pain and spasm. He exhibits no swelling, no edema and no deformity.  Very stiff in lower back and R hip upon arising from exam table. R lower ext has prosthesis.  Neurological: He is alert and oriented to person, place, and time. No cranial nerve deficit. Coordination normal.  Skin: Skin is warm and dry. He is not diaphoretic.     Results for orders placed in visit on 06/20/14  POCT GLYCOSYLATED HEMOGLOBIN (HGB A1C)      Result Value Ref Range   Hemoglobin A1C 8.7         Assessment & Plan:  Type II diabetes mellitus, uncontrolled - Pt advised about importance of meal plan and proper use of Insulins.  Plan: POCT glycosylated hemoglobin (Hb A1C)  Essential hypertension- Stable. Pt reports BP better at home; he has elevated BP after exertion.  Acute low back pain due to trauma- Keep appt with Dr.  Patrice Paradise; continue Oxycodone as prescribed. RX: Soma 350 mg 1 tablet at bedtime.  Chronic pain syndrome - Plan: Wheelchair. Reminded pt to discuss pain medications; I would prefer that Dr. Patrice Paradise assume responsibility for these medications.  Need for shingles vaccine - Plan: zoster vaccine live, PF, (ZOSTAVAX) 04888 UNT/0.65ML injection  DDD (degenerative disc disease), lumbosacral - Plan: Wheelchair  S/P implantation of prosthetic limb device - Plan: Wheelchair   Meds ordered this encounter  Medications  . pregabalin (LYRICA) 100 MG capsule    Sig: TAKE 1 CAPSULE BY MOUTH THREE TIMES DAILY    Dispense:  90 capsule    Refill:  2  . carisoprodol (SOMA) 350 MG tablet    Sig: Take 1 tablet (350 mg total) by mouth at bedtime.    Dispense:  30 tablet    Refill:  0  . zoster vaccine live, PF, (ZOSTAVAX) [id number] UNT/0.65ML injection    Sig: Inject 19,400 Units into the skin once.    Dispense:  1 each    Refill:  0

## 2014-06-20 NOTE — Patient Instructions (Addendum)
Your A1c = 8.7%.  Continue to work on better food choices; take your Insulin as directed . If you are missing doses, work on compliance. Take Apidra (the mealtime Insulin) with main meals. Eating once a day is not helpful for Diabetic control.  Below is information about a meal plan that is a guide to healthier living- it is called MEDITERRANEAN DIET.      Mediterranean Diet  Why follow it? Research shows.   Those who follow the Mediterranean diet have a reduced risk of heart disease    The diet is associated with a reduced incidence of Parkinson's and Alzheimer's diseases   People following the diet may have longer life expectancies and lower rates of chronic diseases    The Dietary Guidelines for Americans recommends the Mediterranean diet as an eating plan to promote health and prevent disease  What Is the Mediterranean Diet?    Healthy eating plan based on typical foods and recipes of Mediterranean-style cooking   The diet is primarily a plant based diet; these foods should make up a majority of meals   Starches - Plant based foods should make up a majority of meals - They are an important sources of vitamins, minerals, energy, antioxidants, and fiber - Choose whole grains, foods high in fiber and minimally processed items  - Typical grain sources include wheat, oats, barley, corn, brown rice, bulgar, farro, millet, polenta, couscous  - Various types of beans include chickpeas, lentils, fava beans, black beans, white beans   Fruits  Veggies - Large quantities of antioxidant rich fruits & veggies; 6 or more servings  - Vegetables can be eaten raw or lightly drizzled with oil and cooked  - Vegetables common to the traditional Mediterranean Diet include: artichokes, arugula, beets, broccoli, brussel sprouts, cabbage, carrots, celery, collard greens, cucumbers, eggplant, kale, leeks, lemons, lettuce, mushrooms, okra, onions, peas, peppers, potatoes, pumpkin, radishes, rutabaga, shallots,  spinach, sweet potatoes, turnips, zucchini - Fruits common to the Mediterranean Diet include: apples, apricots, avocados, cherries, clementines, dates, figs, grapefruits, grapes, melons, nectarines, oranges, peaches, pears, pomegranates, strawberries, tangerines  Fats - Replace butter and margarine with healthy oils, such as olive oil, canola oil, and tahini  - Limit nuts to no more than a handful a day  - Nuts include walnuts, almonds, pecans, pistachios, pine nuts  - Limit or avoid candied, honey roasted or heavily salted nuts - Olives are central to the [employer] - can be eaten whole or used in a variety of dishes   Meats Protein - Limiting red meat: no more than a few times a month - When eating red meat: choose lean cuts and keep the portion to the size of deck of cards - Eggs: approx. 0 to 4 times a week  - Fish and lean poultry: at least 2 a week  - Healthy protein sources include, chicken, [place], lean beef, lamb - Increase intake of seafood such as tuna, salmon, trout, mackerel, shrimp, scallops - Avoid or limit high fat processed meats such as sausage and bacon  Dairy - Include moderate amounts of low fat dairy products  - Focus on healthy dairy such as fat free yogurt, skim milk, low or reduced fat cheese - Limit dairy products higher in fat such as whole or 2% milk, cheese, ice cream  Alcohol - Moderate amounts of red wine is ok  - No more than 5 oz daily for women (all ages) and men older than age 14  - No more than  10 oz of wine daily for men younger than 21  Other - Limit sweets and other desserts  - Use herbs and spices instead of salt to flavor foods  - Herbs and spices common to the traditional Mediterranean Diet include: basil, bay leaves, chives, cloves, cumin, fennel, garlic, lavender, marjoram, mint, oregano, parsley, pepper, rosemary, sage, savory, sumac, tarragon, thyme   It's not just a diet, it's a lifestyle:    The Mediterranean diet includes lifestyle  factors typical of those in the region    Foods, drinks and meals are best eaten with others and savored   Daily physical activity is important for overall good health   This could be strenuous exercise like running and aerobics   This could also be more leisurely activities such as walking, housework, yard-work, or taking the stairs   Moderation is the key; a balanced and healthy diet accommodates most foods and drinks   Consider portion sizes and frequency of consumption of certain foods   Meal Ideas & Options:    Breakfast:  o Whole wheat toast or whole wheat English muffins with peanut butter & hard boiled egg o Steel cut oats topped with apples & cinnamon and skim milk  o Fresh fruit: banana, strawberries, melon, berries, peaches  o Smoothies: strawberries, bananas, greek yogurt, peanut butter o Low fat greek yogurt with blueberries and granola  o Egg white omelet with spinach and mushrooms o Breakfast couscous: whole wheat couscous, apricots, skim milk, cranberries    Sandwiches:  o Hummus and grilled vegetables (peppers, zucchini, squash) on whole wheat bread   o Grilled chicken on whole wheat pita with lettuce, tomatoes, cucumbers or tzatziki  o Tuna salad on whole wheat bread: tuna salad made with greek yogurt, olives, red peppers, capers, green onions o Garlic rosemary lamb pita: lamb sauted with garlic, rosemary, salt & pepper; add lettuce, cucumber, greek yogurt to pita - flavor with lemon juice and black pepper    Seafood:  o Mediterranean grilled salmon, seasoned with garlic, basil, parsley, lemon juice and black pepper o Shrimp, lemon, and spinach whole-grain pasta salad made with low fat greek yogurt  o Seared scallops with lemon orzo  o Seared tuna steaks seasoned salt, pepper, coriander topped with tomato mixture of olives, tomatoes, olive oil, minced garlic, parsley, green onions and cappers    Meats:  o Herbed greek chicken salad with kalamata olives, cucumber, feta   o Red bell peppers stuffed with spinach, bulgur, lean ground beef (or lentils) & topped with feta   o Kebabs: skewers of chicken, tomatoes, onions, zucchini, squash  o [place] burgers: made with red onions, mint, dill, lemon juice, feta cheese topped with roasted red peppers   Vegetarian o Cucumber salad: cucumbers, artichoke hearts, celery, red onion, feta cheese, tossed in olive oil & lemon juice  o Hummus and whole grain pita points with a greek salad (lettuce, tomato, feta, olives, cucumbers, red onion) o Lentil soup with celery, carrots made with vegetable broth, garlic, salt and pepper  o Tabouli salad: parsley, bulgur, mint, scallions, cucumbers, tomato, radishes, lemon juice, olive oil, salt and pepper. o

## 2014-07-10 ENCOUNTER — Other Ambulatory Visit: Payer: Self-pay | Admitting: Physician Assistant

## 2014-07-10 ENCOUNTER — Other Ambulatory Visit: Payer: Self-pay | Admitting: Family Medicine

## 2014-07-11 NOTE — Telephone Encounter (Signed)
Thanks but it looks like he has been on this medication for at least 3 years. I will try to address it w/ him at next visit.

## 2014-07-11 NOTE — Telephone Encounter (Signed)
Dr Leward Quan, this medication shows a high level possible interaction w/sertraline, could cause serotonin syndrome with extrapyramidal movements. I wanted to check w/you before giving any RFs/

## 2014-07-13 ENCOUNTER — Telehealth: Payer: Self-pay

## 2014-07-13 DIAGNOSIS — M5104 Intervertebral disc disorders with myelopathy, thoracic region: Secondary | ICD-10-CM | POA: Diagnosis not present

## 2014-07-13 DIAGNOSIS — G894 Chronic pain syndrome: Secondary | ICD-10-CM | POA: Diagnosis not present

## 2014-07-13 DIAGNOSIS — M545 Low back pain: Secondary | ICD-10-CM | POA: Diagnosis not present

## 2014-07-13 MED ORDER — OXYCODONE HCL 5 MG PO TABS: ORAL_TABLET | ORAL | Status: DC

## 2014-07-13 MED ORDER — OXYCODONE HCL ER 10 MG PO T12A: 10.0000 mg | EXTENDED_RELEASE_TABLET | Freq: Two times a day (BID) | ORAL | Status: DC

## 2014-07-13 NOTE — Telephone Encounter (Signed)
I have printed RXs for oxycodone medications. They are [facility] [address] for pick-up w/ signature on Friday, Jul 14, 2014

## 2014-07-13 NOTE — Telephone Encounter (Signed)
Pt requesting refills on OxyCODONE (OXYCONTIN) 10 mg T12A 12 hr tablet [[id number]] and  oxyCODONE (OXY IR/ROXICODONE) 5 MG immediate release tablet [[id number]]

## 2014-07-14 NOTE — Telephone Encounter (Signed)
Pt advised.

## 2014-08-14 ENCOUNTER — Telehealth: Payer: Self-pay

## 2014-08-14 NOTE — Telephone Encounter (Signed)
Pt requesting Oxycotin,and oxychodone refills    Best phone [phone]

## 2014-08-15 MED ORDER — OXYCODONE HCL 5 MG PO TABS: ORAL_TABLET | ORAL | Status: DC

## 2014-08-15 MED ORDER — OXYCODONE HCL ER 10 MG PO T12A: 10.0000 mg | EXTENDED_RELEASE_TABLET | Freq: Two times a day (BID) | ORAL | Status: DC

## 2014-08-15 NOTE — Telephone Encounter (Signed)
Prescriptions for controlled substances printed and available for pick up at [address] (signature required).

## 2014-08-15 NOTE — Telephone Encounter (Signed)
Called patient advised.

## 2014-08-28 ENCOUNTER — Encounter: Payer: Self-pay | Admitting: Internal Medicine

## 2014-09-12 ENCOUNTER — Other Ambulatory Visit: Payer: Self-pay | Admitting: Family Medicine

## 2014-09-13 ENCOUNTER — Other Ambulatory Visit: Payer: Self-pay | Admitting: Family Medicine

## 2014-09-13 MED ORDER — OXYCODONE HCL ER 10 MG PO T12A: 10.0000 mg | EXTENDED_RELEASE_TABLET | Freq: Two times a day (BID) | ORAL | Status: DC

## 2014-09-13 MED ORDER — OXYCODONE HCL 5 MG PO TABS: ORAL_TABLET | ORAL | Status: DC

## 2014-09-13 NOTE — Telephone Encounter (Signed)
Oxycodone scripts are printed and ready for pick-up at [address]. Pt needs to pick up before 5 PM today. Signature required.

## 2014-09-13 NOTE — Telephone Encounter (Signed)
Called pt to notify ready and someone had already contacted him. He will p/up tomorrow morning.

## 2014-09-13 NOTE — Telephone Encounter (Signed)
Refill Request  oxyCODONE (OXY IR/ROXICODONE) 5 MG immediate release tablet OxyCODONE (OXYCONTIN) 10 mg T12A 12 hr tablet   [phone]

## 2014-09-14 ENCOUNTER — Other Ambulatory Visit: Payer: Self-pay | Admitting: Physician Assistant

## 2014-09-20 ENCOUNTER — Ambulatory Visit (INDEPENDENT_AMBULATORY_CARE_PROVIDER_SITE_OTHER): Payer: Medicare Other | Admitting: Family Medicine

## 2014-09-20 ENCOUNTER — Encounter: Payer: Self-pay | Admitting: Family Medicine

## 2014-09-20 ENCOUNTER — Other Ambulatory Visit: Payer: Self-pay | Admitting: Family Medicine

## 2014-09-20 ENCOUNTER — Ambulatory Visit: Payer: Medicare Other | Admitting: Family Medicine

## 2014-09-20 VITALS — BP 150/70 | HR 81 | Temp 98.5°F | Resp 16 | Ht 71.0 in | Wt 283.0 lb

## 2014-09-20 DIAGNOSIS — E1169 Type 2 diabetes mellitus with other specified complication: Secondary | ICD-10-CM | POA: Insufficient documentation

## 2014-09-20 DIAGNOSIS — I10 Essential (primary) hypertension: Secondary | ICD-10-CM

## 2014-09-20 DIAGNOSIS — L98491 Non-pressure chronic ulcer of skin of other sites limited to breakdown of skin: Secondary | ICD-10-CM | POA: Diagnosis not present

## 2014-09-20 DIAGNOSIS — E1165 Type 2 diabetes mellitus with hyperglycemia: Secondary | ICD-10-CM

## 2014-09-20 DIAGNOSIS — G894 Chronic pain syndrome: Secondary | ICD-10-CM | POA: Diagnosis not present

## 2014-09-20 DIAGNOSIS — E669 Obesity, unspecified: Secondary | ICD-10-CM

## 2014-09-20 DIAGNOSIS — E1139 Type 2 diabetes mellitus with other diabetic ophthalmic complication: Secondary | ICD-10-CM

## 2014-09-20 DIAGNOSIS — IMO0002 Reserved for concepts with insufficient information to code with codable children: Secondary | ICD-10-CM

## 2014-09-20 LAB — BASIC METABOLIC PANEL WITH GFR
BUN: 11 mg/dL (ref 6–23)
CHLORIDE: 97 meq/L (ref 96–112)
CO2: 24 meq/L (ref 19–32)
CREATININE: 0.8 mg/dL (ref 0.50–1.35)
Calcium: 10.2 mg/dL (ref 8.4–10.5)
GFR, Est African American: >89 mL/min
Glucose, Bld: 304 mg/dL — ABNORMAL HIGH (ref 70–99)
Potassium: 3.7 mEq/L (ref 3.5–5.3)
Sodium: 134 mEq/L — ABNORMAL LOW (ref 135–145)

## 2014-09-20 LAB — POCT GLYCOSYLATED HEMOGLOBIN (HGB A1C): HEMOGLOBIN A1C: 10.7

## 2014-09-20 MED ORDER — MUPIROCIN CALCIUM 2 % EX CREA: 1.0000 "application " | TOPICAL_CREAM | Freq: Two times a day (BID) | CUTANEOUS | Status: DC

## 2014-09-20 MED ORDER — MELOXICAM 15 MG PO TABS: 15.0000 mg | ORAL_TABLET | Freq: Every day | ORAL | Status: DC

## 2014-09-20 NOTE — Patient Instructions (Addendum)
Keep me informed about getting an appointment with PAIN MANAGEMENT.   Diabetes is not controlled; take APIDRA 10 units before main meals, 3 times daily. Resume healthy eating and reduce soda intake to 1 per day. Try to have small regular, healthy meals spread throughout the day.  Contact Dr. Ulyses Amor office and schedule your follow-up GI evaluation.

## 2014-09-20 NOTE — Progress Notes (Signed)
Subjective:    Patient ID: [name], male    DOB: 09/07/1951, 64 y.o.   MRN: [id number]  HPI  This 64 y.o. 63 male is well known to me; he has uncontrolled Type II DM and reports FSBS have been "all over the place". He reports compliance w/ medications but upon further questioning, he stopped metformin > 6 months ago. He states that med caused GI upset. Apidra Insulin is not being taken 3 times daily before meals; pt takes it "when BS is very high and it brings sugar right down". Pt states he is eating the same foods that wife consumes; she has lost 30+ lbs. His soda intake is down to 2-3 per day.  Pt is not very active due to chronic pain and prosthetic limb. He also has a sore on inner aspect of R stump. Neosporin is not healing lesion; his prosthetic leg "cuts into" back of knee and rubs it raw. Daily regimen includes cleansing sleeve and skin on stump. Pt states he will talk with person who made this prosthetic device to get it "shaved down" so that friction is reduced and fit is better.  Pt has chronic back pain/joint pain requiring narcotic medications. I have advised pt that he will need to be managed by pain specialist and he contacted the facility in [facility]. Pt reports that $200 is required up front and he cannot afford this; the [place] facility does not take Medicare. He reports that he is checking into 2 other pain specialists who may work with [facility]. He has seen several ORTHO and Spine specialists and has been advised that surgery is a very complicated option and pt is not a good surgical candidate. He is not interested in surgery.  HTN is well controlled on current medication w/o report of adverse effects. Pt does not report diaphoresis, vision disturbances, CP or tightness (except as it related to his severe GERD), palpitations, SOB or DOE, HA, dizziness , numbness, weakness or syncope.  Patient Active Problem List   Diagnosis Date Noted  . DM (diabetes mellitus) type II  uncontrolled with eye manifestation 09/20/2014  . Diabetic retinopathy 12/30/2013  . Gastroesophageal reflux disease with hiatal hernia 09/23/2013  . Chronic pain syndrome 09/16/2013  . 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 Disturbance of skin sensation 03/25/2012  . Sleep apnea 02/20/2012  . 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 CAD (coronary artery disease) 11/25/2011  . Encounter for long-term (current) use of other medications 03/03/2011  . 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 SHOULDER PAIN, BILATERAL 01/10/2010  . UNSPECIFIED PERIPHERAL VASCULAR DISEASE 11/12/2009  . ESOPHAGITIS 11/12/2009  . ANEMIA, IRON DEFICIENCY 10/11/2009  . DEPRESSION 05/17/2009  . HYPERCHOLESTEROLEMIA 09/18/2008  . PRIMARY HYPERPARATHYROIDISM 01/21/2008  . DDD (degenerative disc disease), lumbosacral 01/21/2008  . Essential hypertension 03/29/2007    Prior to Admission medications   Medication Sig Start Date End Date Taking? Authorizing Provider  APIDRA SOLOSTAR 100 UNIT/ML Solostar Pen INJECT 10 UNITS INTO THE SKIN THREE TIMES DAILY BEFORE MEALS 05/01/14  Yes Barton Fanny, MD  aspirin EC 81 MG tablet Take 81 mg by mouth daily. ON HOLD AS OF 01-06-14 12/03/11  Yes Blane Ohara, MD  carisoprodol (SOMA) 350 MG tablet Take 1 tablet (350 mg total) by mouth at bedtime. 06/20/14  Yes Barton Fanny, MD  Elastic Bandages & Supports (KNEE SLEEVE/MEDIUM) MISC 1 Units by Does not apply route as needed. 04/02/14  Yes Barton Fanny, MD  furosemide (LASIX) 40 MG tablet TAKE 1 TABLET BY MOUTH DAILY 05/18/14  Yes Blane Ohara, MD  glucose blood (FREESTYLE LITE) test strip Check blood sugar three times a day dx 250.01 10/27/12  Yes Theda Sers, PA-C  glucose blood test strip Use as instructed 11/16/13  Yes Barton Fanny, MD  HUMALOG MIX 50/50 KWIKPEN (50-50) 100 UNIT/ML Kwikpen INJECT 125 UNITS UNDER THE SKIN EVERY MORNING AND THEN INJECT 95 UNITS EVERY EVENING 07/11/14  Yes Barton Fanny, MD  Insulin Pen Needle 31G X 8 MM MISC Inject under skin twice a day. 11/16/13  Yes Barton Fanny, MD  LORazepam (ATIVAN) 0.5 MG tablet Take 1 tablet at bedtime prn anxiety. 03/16/14  Yes Barton Fanny, MD  meloxicam (MOBIC) 15 MG tablet Take 1 tablet (15 mg total) by mouth daily.   Yes Barton Fanny, MD  metoCLOPramide (REGLAN) 10 MG tablet TAKE 1 TABLET BY MOUTH THREE TIMES DAILY, BEFORE A MEAL 07/11/14  Yes Barton Fanny, MD  omeprazole (PRILOSEC) 20 MG capsule Take 20 mg by mouth 2 (two) times daily before a meal.   Yes Historical Provider, MD  ondansetron (ZOFRAN) 8 MG tablet TAKE AS NEEDED 09/14/11  Yes Renato Shin, MD  oxyCODONE (OXY IR/ROXICODONE) 5 MG immediate release tablet Take 1 tablet every 6 hours as needed for pain. 09/13/14  Yes Barton Fanny, MD  OxyCODONE (OXYCONTIN) 10 mg T12A 12 hr tablet Take 1 tablet (10 mg total) by mouth every 12 (twelve) hours. 09/13/14  Yes Barton Fanny, MD  pregabalin (LYRICA) 100 MG capsule TAKE 1 CAPSULE BY MOUTH THREE TIMES DAILY 06/20/14  Yes Barton Fanny, MD  sertraline (ZOLOFT) 100 MG tablet TAKE 1 TABLET BY MOUTH DAILY 06/08/14  Yes Mancel Bale, PA-C  simvastatin (ZOCOR) 40 MG tablet Take 1 tablet (40 mg total) by mouth at bedtime. 07/28/12  Yes Barton Fanny, MD  sucralfate (CARAFATE) 1 GM/10ML suspension Take 1 g by mouth 4 (four) times daily. As needed    Yes Historical Provider, MD  traMADol (ULTRAM) 50 MG tablet Take 2 tablets (100 mg total) by mouth every 12 (twelve) hours as needed. 03/16/14  Yes Barton Fanny, MD  traZODone (DESYREL) 100 MG tablet TAKE 1 TABLET BY MOUTH AT BEDTIME   Yes Barton Fanny, MD  triamcinolone cream (KENALOG) 0.1 % Apply topically 3 (three) times daily. As needed for itching 04/20/12  Yes Barton Fanny, MD  Vitamin D, Ergocalciferol, (DRISDOL) 50000 UNITS CAPS capsule TAKE ONE CAPSULE BY MOUTH EVERY 7 DAYS 09/14/14  Yes Mancel Bale, PA-C    zolpidem (AMBIEN) 10 MG tablet Take 1 tablet (10 mg total) by mouth at bedtime as needed for sleep. 04/20/13  Yes Barton Fanny, MD  zoster vaccine live, PF, (ZOSTAVAX) 22979 UNT/0.65ML injection Inject 19,400 Units into the skin once. 06/20/14  Yes Barton Fanny, MD    PMHx, SURG Hx, SOC and FAM Hx reviewed.   Review of Systems As per HPI; otherwise noncontributory.     Objective:   Physical Exam  Constitutional: He is oriented to person, place, and time. He appears well-developed and well-nourished. No distress.  HENT:  Head: Normocephalic and atraumatic.  Right Ear: External ear normal.  Left Ear: External ear normal.  Nose: Nose normal.  Mouth/Throat: Oropharynx is clear and moist.  Eyes: Conjunctivae and EOM are normal. Pupils are equal, round, and reactive to light. No scleral icterus.  Cardiovascular: Normal  rate and regular rhythm.   Pulmonary/Chest: Effort normal. No respiratory distress.  Musculoskeletal: He exhibits no edema.  Neurological: He is alert and oriented to person, place, and time. No cranial nerve deficit. Coordination normal.  Skin: Skin is warm and dry. Lesion and rash noted. He is not diaphoretic. No pallor.  R stump w/ BKA- 1 cm shallow ulceration medial aspect of popliteal space at knee joint.  Other rash in area cover by sleeve- coin-sized lesions w/ raised irreg-shaped border. Scaliness and mild background erythema noted.  Psychiatric: He has a normal mood and affect. His behavior is normal. Judgment and thought content normal.  Nursing note and vitals reviewed.   Results for orders placed or performed in visit on 09/20/14  POCT glycosylated hemoglobin (Hb A1C)  Result Value Ref Range   Hemoglobin A1C 10.7        Assessment & Plan:  Type II diabetes mellitus, uncontrolled - Reviewed importance of meal planning, portion sizes and taking medications as prescribed. Discontinue metformin. Take Apidra 3 times daily before main meals or  sizable snack. Continue other Insulin as directed. Plan: POCT glycosylated hemoglobin (Hb F3L), BASIC METABOLIC PANEL WITH GFR, For home use only DME Glucometer  Essential hypertension - Stable on current medications. Plan: POCT glycosylated hemoglobin (Hb K5G), BASIC METABOLIC PANEL WITH GFR  Chronic pain syndrome- Pt will contact me if he is able to get a appt with pain specialist.  Superficial ulcer of skin- Continue current skin care; Bactroban bid application. Leave prosthetic device off if not ambulating.   Discussed [id number] with pt; he will receive this vaccine at next visit.  Meds ordered this encounter  Medications  . mupirocin cream (BACTROBAN) 2 %    Sig: Apply 1 application topically 2 (two) times daily.    Dispense:  30 g    Refill:  1  . meloxicam (MOBIC) 15 MG tablet    Sig: Take 1 tablet (15 mg total) by mouth daily.    Dispense:  30 tablet    Refill:  3

## 2014-09-24 NOTE — Progress Notes (Signed)
Quick Note:  Please advise pt regarding following labs...  Blood sugar is very elevated as we discussed. Sodium is below normal because blood sugar is so high. Kidney function remains normal.  Copy to pt. ______

## 2014-09-25 ENCOUNTER — Encounter: Payer: Self-pay | Admitting: *Deleted

## 2014-09-26 ENCOUNTER — Other Ambulatory Visit: Payer: Self-pay

## 2014-09-26 ENCOUNTER — Other Ambulatory Visit: Payer: Self-pay | Admitting: Family Medicine

## 2014-09-26 MED ORDER — GLUCOSE BLOOD VI STRP
ORAL_STRIP | Status: DC
Start: 2014-09-26 — End: 2015-11-05

## 2014-09-26 MED ORDER — LANCETS MISC: Status: DC

## 2014-10-10 ENCOUNTER — Other Ambulatory Visit: Payer: Self-pay | Admitting: Family Medicine

## 2014-10-11 ENCOUNTER — Telehealth: Payer: Self-pay

## 2014-10-11 NOTE — Telephone Encounter (Signed)
Pended both w/ may fill date of Jan 31. Last Rxs for 09/15/14.

## 2014-10-11 NOTE — Telephone Encounter (Signed)
Patient requesting a refill on his "Oxycodone" and "Oxycontin". Please call patient at [phone] when ready to be picked up.

## 2014-10-14 MED ORDER — OXYCODONE HCL ER 10 MG PO T12A: 10.0000 mg | EXTENDED_RELEASE_TABLET | Freq: Two times a day (BID) | ORAL | Status: DC

## 2014-10-14 MED ORDER — OXYCODONE HCL 5 MG PO TABS: ORAL_TABLET | ORAL | Status: DC

## 2014-10-14 NOTE — Telephone Encounter (Signed)
RXs printed and will be at [facility] for pick-up on Sunday afternoon after 2 PM. At last visit, we discussed that pt is supposed to be making efforts to find a Pain Management Facility. He mentioned that he had contacted 2 facilities in [facility] that work w/ Medicare pts. Please remind pt about this. I expect an update on this situation at his visit in March.  Thank you.

## 2014-10-16 NOTE — Telephone Encounter (Signed)
Patient called checking status of prescription. Informed him that it is in the pick up drawer at [address].

## 2014-11-06 ENCOUNTER — Other Ambulatory Visit: Payer: Self-pay

## 2014-11-06 NOTE — Telephone Encounter (Signed)
Pharm sent req for tramadol Rf.

## 2014-11-07 MED ORDER — TRAMADOL HCL 50 MG PO TABS: 100.0000 mg | ORAL_TABLET | Freq: Two times a day (BID) | ORAL | Status: DC | PRN

## 2014-11-07 NOTE — Telephone Encounter (Signed)
Tramadol refill phoned to pt's pharmacy.

## 2014-11-16 ENCOUNTER — Other Ambulatory Visit: Payer: Self-pay | Admitting: Family Medicine

## 2014-11-29 ENCOUNTER — Encounter: Payer: Self-pay | Admitting: Family Medicine

## 2014-11-29 ENCOUNTER — Ambulatory Visit (INDEPENDENT_AMBULATORY_CARE_PROVIDER_SITE_OTHER): Payer: Medicare Other | Admitting: Family Medicine

## 2014-11-29 VITALS — BP 142/64 | HR 98 | Temp 98.0°F | Resp 18 | Ht 71.0 in | Wt 261.6 lb

## 2014-11-29 DIAGNOSIS — Z23 Encounter for immunization: Secondary | ICD-10-CM | POA: Diagnosis not present

## 2014-11-29 DIAGNOSIS — B372 Candidiasis of skin and nail: Secondary | ICD-10-CM | POA: Diagnosis not present

## 2014-11-29 DIAGNOSIS — L97911 Non-pressure chronic ulcer of unspecified part of right lower leg limited to breakdown of skin: Secondary | ICD-10-CM

## 2014-11-29 DIAGNOSIS — G894 Chronic pain syndrome: Secondary | ICD-10-CM

## 2014-11-29 MED ORDER — MUPIROCIN CALCIUM 2 % EX CREA: 1.0000 "application " | TOPICAL_CREAM | Freq: Two times a day (BID) | CUTANEOUS | Status: DC

## 2014-11-29 MED ORDER — LORAZEPAM 0.5 MG PO TABS: ORAL_TABLET | ORAL | Status: DC

## 2014-11-29 MED ORDER — KETOCONAZOLE 2 % EX CREA: 1.0000 "application " | TOPICAL_CREAM | Freq: Every day | CUTANEOUS | Status: DC

## 2014-11-29 MED ORDER — TRAMADOL HCL 50 MG PO TABS: 100.0000 mg | ORAL_TABLET | Freq: Two times a day (BID) | ORAL | Status: DC | PRN

## 2014-11-29 MED ORDER — DOXYCYCLINE HYCLATE 100 MG PO TABS: 100.0000 mg | ORAL_TABLET | Freq: Two times a day (BID) | ORAL | Status: DC

## 2014-11-29 MED ORDER — OXYCODONE HCL 5 MG PO TABS: ORAL_TABLET | ORAL | Status: DC

## 2014-11-29 NOTE — Patient Instructions (Signed)
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 I will see you in 6 weeks to follow-up on Diabetes and do some lab tests.

## 2014-11-30 NOTE — Progress Notes (Addendum)
Subjective:    Patient ID: [name], male    DOB: 18-Mar-1951, 64 y.o.   MRN: [id number]  HPI  This 7 y.oale has Type II DM, poorly controlled on current medications. He had been non-compliant re: nutrition but states he has improved and is better about using Apidra Insulin. FSBS not monitored regularly but values tend to be above 150. Pt denies hypoglycemia.  His activity is limited due to R leg prosthesis; ill-fitting device is rubbing back of his leg and he has a small non-healing ulcer on back of knee. It heals w/ topical mupirocin ointment if left uncovered. Pt is scheduled to have prothesis adjusted or new one made next week.  Chronic pain medication- pt is not taking oxycodone 10 mg tablets; he has a bottle that is almost full. He has not taken this medication in > 10 days. He is determined to wean off the narcotics. He takes oxycodone 5 mg tablets 1-2 times a day. Tramadol is adequate most of the tme for his pain. Pt has decided that he does not want to go back to pain management so he plans to eventually discontinue all narcotics. He does have moderately severe pain on occasion. Lyrica is effective for long term pain management.  Pt has a rash in L armpit that is itchy; present for 7-10 days. Pt used expired nystatin-type cream on the area w/o improvement.  Patient Active Problem List   Diagnosis Date Noted  . DM (diabetes mellitus) type II uncontrolled with eye manifestation 09/20/2014  . Diabetic retinopathy 12/30/2013  . Gastroesophageal reflux disease with hiatal hernia 09/23/2013  . Chronic pain syndrome 09/16/2013  . 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 Disturbance of skin sensation 03/25/2012  . Sleep apnea 02/20/2012  . 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 CAD (coronary artery disease) 11/25/2011  . Encounter for long-term (current) use of other medications 03/03/2011  . 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 SHOULDER PAIN, BILATERAL 01/10/2010  .  UNSPECIFIED PERIPHERAL VASCULAR DISEASE 11/12/2009  . ESOPHAGITIS 11/12/2009  . ANEMIA, IRON DEFICIENCY 10/11/2009  . DEPRESSION 05/17/2009  . HYPERCHOLESTEROLEMIA 09/18/2008  . PRIMARY HYPERPARATHYROIDISM 01/21/2008  . DDD (degenerative disc disease), lumbosacral 01/21/2008  . Essential hypertension 03/29/2007    Prior to Admission medications   Medication Sig Start Date End Date Taking? Authorizing Provider  APIDRA SOLOSTAR 100 UNIT/ML Solostar Pen INJECT 10 UNITS INTO THE SKIN THREE TIMES DAILY BEFORE MEALS 05/01/14  Yes Barton Fanny, MD  aspirin EC 81 MG tablet Take 81 mg by mouth daily. ON HOLD AS OF 01-06-14 12/03/11  Yes Sherren Mocha, MD  carisoprodol (SOMA) 350 MG tablet Take 1 tablet (350 mg total) by mouth at bedtime. 06/20/14  Yes Barton Fanny, MD  furosemide (LASIX) 40 MG tablet TAKE 1 TABLET BY MOUTH DAILY 05/18/14  Yes Sherren Mocha, MD  HUMALOG MIX 50/50 KWIKPEN (50-50) 100 UNIT/ML Kwikpen INJECT 125 UNITS UNDER THE SKIN EVERY MORNING AND THEN INJECT 95 UNITS EVERY EVENING 07/11/14  Yes Barton Fanny, MD  LORazepam (ATIVAN) 0.5 MG tablet Take 1 tablet at bedtime prn anxiety.   Yes Barton Fanny, MD  LYRICA 100 MG capsule TAKE ONE CAPSULE BY MOUTH THREE TIMES DAILY 10/11/14  Yes Barton Fanny, MD  meloxicam (MOBIC) 15 MG tablet Take 1 tablet (15 mg total) by mouth daily. 09/20/14  Yes Barton Fanny, MD  metoCLOPramide (REGLAN) 10 MG tablet TAKE 1 TABLET BY MOUTH THREE TIMES DAILY BEFORE A MEAL  10/11/14  Yes Barton Fanny, MD  mupirocin cream (BACTROBAN) 2 % Apply 1 application topically 2 (two) times daily.   Yes Barton Fanny, MD  omeprazole (PRILOSEC) 20 MG capsule Take 20 mg by mouth 2 (two) times daily before a meal.   Yes Historical Provider, MD  ondansetron (ZOFRAN) 8 MG tablet TAKE AS NEEDED 09/14/11  Yes Renato Shin, MD  oxyCODONE (OXY IR/ROXICODONE) 5 MG immediate release tablet Take 1 tablet every 8 hours as needed for pain.    Yes Barton Fanny, MD  sertraline (ZOLOFT) 100 MG tablet TAKE 1 TABLET BY MOUTH DAILY 06/08/14  Yes Mancel Bale, PA-C  simvastatin (ZOCOR) 40 MG tablet Take 1 tablet (40 mg total) by mouth at bedtime. 07/28/12  Yes Barton Fanny, MD  sucralfate (CARAFATE) 1 GM/10ML suspension Take 1 g by mouth 4 (four) times daily. As needed    Yes Historical Provider, MD  traMADol (ULTRAM) 50 MG tablet Take 2 tablets (100 mg total) by mouth every 12 (twelve) hours as needed.   Yes Barton Fanny, MD  triamcinolone cream (KENALOG) 0.1 % Apply topically 3 (three) times daily. As needed for itching 04/20/12  Yes Barton Fanny, MD  Vitamin D, Ergocalciferol, (DRISDOL) 50000 UNITS CAPS capsule TAKE ONE CAPSULE BY MOUTH EVERY 7 DAYS 09/14/14  Yes Mancel Bale, PA-C  zolpidem (AMBIEN) 10 MG tablet Take 1 tablet (10 mg total) by mouth at bedtime as needed for sleep. 04/20/13  Yes Barton Fanny, MD  Elastic Bandages & Supports (KNEE SLEEVE/MEDIUM) MISC 1 Units by Does not apply route as needed. 04/02/14   Barton Fanny, MD  glucose blood test strip Check blood sugar three times a day. Dx codes: E11.39, E11.65. 20-Oct-2014   Barton Fanny, MD  Insulin Pen Needle 31G X 8 MM MISC Inject under skin twice a day. 11/16/13   Barton Fanny, MD  Lancets MISC Check blood sugar three times a day. Dx codes: E11.39, E11.65. 10/20/2014   Barton Fanny, MD  OxyCODONE (OXYCONTIN) 10 mg T12A 12 hr tablet Take 1 tablet (10 mg total) by mouth every 12 (twelve) hours. Patient not taking: Reported on 11/29/2014 10/14/14   Barton Fanny, MD  traZODone (DESYREL) 100 MG tablet TAKE 1 TABLET BY MOUTH EVERY NIGHT AT BEDTIME 10/11/14   Barton Fanny, MD  zoster vaccine live, PF, (ZOSTAVAX) 95638 UNT/0.65ML injection Inject 19,400 Units into the skin once. 06/20/14   Barton Fanny, MD    Past Surgical History  Procedure Laterality Date  . Appendectomy    . Thyroidectomy    . Tonsillectomy      . Spine surgery  1993    L Spine Disc Reset  . Carpal tunnel release  2007    Right  . Trigger finger release  2007  . Knee arthroscopy  2007    right knee  . Partial esophagectomy  2011  . Partial gastrectomy  2011    Review of Systems  As per HPI.     Objective:   Physical Exam  Constitutional: He is oriented to person, place, and time. He appears well-developed and well-nourished. No distress.  Blood pressure 142/64, pulse 98, temperature 98 F (36.7 C), temperature source Oral, resp. rate 18, height 5\' 11"  (1.803 m), weight 261 lb 9.6 oz (118.661 kg), SpO2 97 %.    HENT:  Head: Normocephalic and atraumatic.  Right Ear: External ear normal.  Left Ear: External ear normal.  Mouth/Throat: Oropharynx is clear and moist.  Eyes: Conjunctivae and EOM are normal. No scleral icterus.  Cardiovascular: Normal rate and regular rhythm.   Pulmonary/Chest: Effort normal. No respiratory distress.  Neurological: He is alert and oriented to person, place, and time. No cranial nerve deficit. Gait abnormal. Coordination normal.  Pt bears weight on cane which he places in front; he cannot bear much weight on prosthetic leg.  Skin: Skin is warm and dry. Rash noted. He is not diaphoretic. No pallor.  Intensely red rash in L axilla; erythematous papules noted at edges of large macule; mild maceration noted. Popliteal space of R knee- 0.5 cm shallow ulcer w/o erythema or drainage.  Psychiatric: He has a normal mood and affect. His behavior is normal. Judgment and thought content normal.  PHQ-9 score= 6.  Nursing note and vitals reviewed.      Assessment & Plan:  Skin yeast infection- RX: Nizoral cream.  Ulcer of lower extremity, right, limited to breakdown of skin- RX: Doxycycline bid and topical mupirocin as directed. Keep appt to have prosthesis adjusted.  Chronic pain syndrome- Congratulated pt on weaning off oxycodone 10 mg tablets,. HE will continue to use oxycodone 5 mg tablets bid  prn and Tramadol hs. His goal is to be off pain medications by this summer, if not sooner.  Need for prophylactic vaccination against Streptococcus pneumoniae (pneumococcus) - Plan: Pneumococcal conjugate vaccine 13-valent IM  Meds ordered this encounter  Medications  . oxyCODONE (OXY IR/ROXICODONE) 5 MG immediate release tablet    Sig: Take 1 tablet every 8 hours as needed for pain.    Dispense:  100 tablet    Refill:  0    May fill on or after December 03, 2014.  . traMADol (ULTRAM) 50 MG tablet    Sig: Take 2 tablets (100 mg total) by mouth every 12 (twelve) hours as needed.    Dispense:  120 tablet    Refill:  0  . LORazepam (ATIVAN) 0.5 MG tablet    Sig: Take 1 tablet at bedtime prn anxiety.    Dispense:  30 tablet    Refill:  2  . mupirocin cream (BACTROBAN) 2 %    Sig: Apply 1 application topically 2 (two) times daily.    Dispense:  30 g    Refill:  1  . doxycycline (VIBRA-TABS) 100 MG tablet    Sig: Take 1 tablet (100 mg total) by mouth 2 (two) times daily.    Dispense:  20 tablet    Refill:  0  . ketoconazole (NIZORAL) 2 % cream    Sig: Apply 1 application topically daily. Use for at least 2 weeks.    Dispense:  30 g    Refill:  1     Regarding request for LIGHTWEIGHT WHEELCHAIR:   This 64 y.o male suffers from lumbosacral and thoracic degenerative disc disease, chronic pain syndrome and peripheral vascular disease; he is also s/p right below-the-knee amputation requiring prosthesis (ill-fitting). These medical problems impair his ability to perform daily activities like toiletting, standing for long periods for meal preparation and getting out of his home to shop for groceries and attend other activities. A cane or walker will not resolve the issues that are negatively affect activities of daily living. Due to instability on prosthesis, the pt has been prone to near-falls. A wheelchair will allow the pt to safely perform daily activities.  Pt requires a set dimension of  22"  x 18" which is not available in a  standard or lightweight wheelchair and he spends at least 4 hours per day in the chair.

## 2014-12-14 ENCOUNTER — Telehealth: Payer: Self-pay | Admitting: Family Medicine

## 2014-12-14 NOTE — Telephone Encounter (Signed)
Corene Cornea from [facility] called and states that he faxed an order for a new wheelchair for the patient.   [phone].

## 2014-12-19 ENCOUNTER — Encounter: Payer: Self-pay | Admitting: Internal Medicine

## 2014-12-25 NOTE — Telephone Encounter (Signed)
Corene Cornea from [place] called and is faxing over another form for a new wheelchair for this patieint.   Dr. Leward Quan   [phone]

## 2015-01-09 ENCOUNTER — Other Ambulatory Visit: Payer: Self-pay | Admitting: Family Medicine

## 2015-01-10 ENCOUNTER — Encounter: Payer: Self-pay | Admitting: Family Medicine

## 2015-01-10 ENCOUNTER — Ambulatory Visit (INDEPENDENT_AMBULATORY_CARE_PROVIDER_SITE_OTHER): Payer: Medicare Other

## 2015-01-10 ENCOUNTER — Ambulatory Visit: Payer: Medicare Other

## 2015-01-10 ENCOUNTER — Ambulatory Visit (INDEPENDENT_AMBULATORY_CARE_PROVIDER_SITE_OTHER): Payer: Medicare Other | Admitting: Family Medicine

## 2015-01-10 VITALS — BP 131/66 | HR 64 | Temp 98.3°F | Resp 16

## 2015-01-10 DIAGNOSIS — D509 Iron deficiency anemia, unspecified: Secondary | ICD-10-CM | POA: Diagnosis not present

## 2015-01-10 DIAGNOSIS — M25511 Pain in right shoulder: Secondary | ICD-10-CM

## 2015-01-10 DIAGNOSIS — E1139 Type 2 diabetes mellitus with other diabetic ophthalmic complication: Secondary | ICD-10-CM

## 2015-01-10 DIAGNOSIS — M25512 Pain in left shoulder: Secondary | ICD-10-CM

## 2015-01-10 DIAGNOSIS — IMO0002 Reserved for concepts with insufficient information to code with codable children: Secondary | ICD-10-CM

## 2015-01-10 DIAGNOSIS — E1165 Type 2 diabetes mellitus with hyperglycemia: Secondary | ICD-10-CM | POA: Diagnosis not present

## 2015-01-10 LAB — CBC
HCT: 36.9 % — ABNORMAL LOW (ref 39.0–52.0)
HEMOGLOBIN: 12.3 g/dL — AB (ref 13.0–17.0)
MCH: 32.5 pg (ref 26.0–34.0)
MCHC: 33.3 g/dL (ref 30.0–36.0)
MCV: 97.6 fL (ref 78.0–100.0)
MPV: 10.7 fL (ref 8.6–12.4)
Platelets: 284 10*3/uL (ref 150–400)
RBC: 3.78 MIL/uL — ABNORMAL LOW (ref 4.22–5.81)
RDW: 16.3 % — ABNORMAL HIGH (ref 11.5–15.5)
WBC: 7.2 10*3/uL (ref 4.0–10.5)

## 2015-01-10 LAB — BASIC METABOLIC PANEL
BUN: 10 mg/dL (ref 6–23)
CALCIUM: 10 mg/dL (ref 8.4–10.5)
CO2: 27 mEq/L (ref 19–32)
Chloride: 101 mEq/L (ref 96–112)
Creat: 0.8 mg/dL (ref 0.50–1.35)
GLUCOSE: 235 mg/dL — AB (ref 70–99)
Potassium: 4.4 mEq/L (ref 3.5–5.3)
Sodium: 136 mEq/L (ref 135–145)

## 2015-01-10 LAB — POCT GLYCOSYLATED HEMOGLOBIN (HGB A1C): Hemoglobin A1C: 9.4

## 2015-01-10 MED ORDER — OXYCODONE HCL 5 MG PO TABS: ORAL_TABLET | ORAL | Status: DC

## 2015-01-10 MED ORDER — TRAMADOL HCL 50 MG PO TABS: 100.0000 mg | ORAL_TABLET | Freq: Two times a day (BID) | ORAL | Status: DC | PRN

## 2015-01-10 MED ORDER — TRAMADOL HCL 50 MG PO TABS: 50.0000 mg | ORAL_TABLET | Freq: Three times a day (TID) | ORAL | Status: DC | PRN
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
## 2015-01-10 NOTE — Patient Instructions (Signed)
Dizziness and feeling off balance may be related to 2 of your pain medications. Sertraline and Tramadol can cause problems with unsteady gait. I have decreased Tramadol to 1 tablet every 8 hours. You will continue taking same dose of Sertraline (Zoloft). Try to limit use of Tramadol; I know you want to discontinue narcotics in the future.

## 2015-01-11 NOTE — Progress Notes (Signed)
Quick Note:  Please advise pt regarding following labs...  Blood sugar is above normal; we discussed changes to improve Diabetes control. Kidney function is stable. Chronic anemia is stable. Try to improve your nutrition; drink a bottle of Glucerna daily in place of a meal or as a partial meal replacement.   Copy to pt. ______

## 2015-01-12 ENCOUNTER — Encounter: Payer: Self-pay | Admitting: Family Medicine

## 2015-01-15 ENCOUNTER — Telehealth: Payer: Self-pay | Admitting: *Deleted

## 2015-01-15 NOTE — Telephone Encounter (Signed)
Pharmacy requesting refill on Tramadol 50 mg tab.

## 2015-01-16 NOTE — Telephone Encounter (Signed)
Tramadol prescription was printed out and given to pt at [facility] on 01/10/2015.

## 2015-01-17 NOTE — Telephone Encounter (Signed)
LM for pharm that pt was given paper copy 4/27.

## 2015-02-01 ENCOUNTER — Telehealth: Payer: Self-pay

## 2015-02-01 NOTE — Telephone Encounter (Signed)
[name] from [facility] is calling in regards to getting a prescription for a light weight wheel chair the patient. [name] states that he has been trying to get this taken care of since March 17th. He states that he's called several times and sent many faxes and hasn't heard a response. [name] states that the patient's family has been fussing at him for not getting this taken care. He also states that the patient has a spine disease and hasn't been able to leave the house for two weeks. [name] also states that our office hasn't been doing a good job with responding to the patient needs and also goes on to state that he's been here for himself and he's had the same experience getting his medical needs met. He is sending another fax and would like to this to get taken care of today. Please call [name]! Phone: [phone] ext. [phone]

## 2015-02-02 NOTE — Telephone Encounter (Signed)
Can we Rx wheelchair?

## 2015-02-02 NOTE — Telephone Encounter (Signed)
I have completed the form/prescripton for the wheelchair according to the written instructions. I faxed the 16-page document back on 02/02/2015 at 7:45 PM. I phoned Jiles Crocker and left a message to let him know that the document has been faxed to his office. I asked that he notify us and acknowledge receipt of said document.

## 2015-02-05 ENCOUNTER — Telehealth: Payer: Self-pay

## 2015-02-05 NOTE — Telephone Encounter (Signed)
FYi Dr Leward Quan

## 2015-02-05 NOTE — Telephone Encounter (Signed)
[name] is wanting to let Dr Leward Quan know that they have received the paperwork and it is processed

## 2015-02-09 ENCOUNTER — Encounter: Payer: Self-pay | Admitting: Family Medicine

## 2015-02-09 ENCOUNTER — Ambulatory Visit (INDEPENDENT_AMBULATORY_CARE_PROVIDER_SITE_OTHER): Payer: Medicare Other | Admitting: Family Medicine

## 2015-02-09 VITALS — BP 158/76 | HR 92 | Temp 98.5°F | Resp 18 | Wt 254.2 lb

## 2015-02-09 DIAGNOSIS — Z87898 Personal history of other specified conditions: Secondary | ICD-10-CM | POA: Diagnosis not present

## 2015-02-09 DIAGNOSIS — G894 Chronic pain syndrome: Secondary | ICD-10-CM

## 2015-02-09 MED ORDER — OXYCODONE HCL 5 MG PO TABS: ORAL_TABLET | ORAL | Status: DC

## 2015-02-09 NOTE — Patient Instructions (Addendum)
You have done a great job with weight loss and nutrition improvement. You will be scheduled to see Dr. Carlota Raspberry in 4 weeks to get established and discuss pain management.  Keep in mind that he may require that you do a urine drug screen as we discussed to day. Continuation of prescribing narcotics will be at his discretion.  I have provided you with a prescription for the month of July; this will have to last until you see Dr. Carlota Raspberry in August.

## 2015-02-13 ENCOUNTER — Encounter: Payer: Self-pay | Admitting: Family Medicine
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
## 2015-02-23 ENCOUNTER — Other Ambulatory Visit: Payer: Self-pay | Admitting: Family Medicine

## 2015-03-09 ENCOUNTER — Other Ambulatory Visit: Payer: Self-pay | Admitting: Family Medicine

## 2015-03-09 NOTE — Telephone Encounter (Signed)
Rx faxed

## 2015-03-09 NOTE — Telephone Encounter (Signed)
Meds ordered this encounter  Medications  . traMADol (ULTRAM) 50 MG tablet    Sig: TAKE 2 TABLETS BY MOUTH EVERY 12 HOURS AS NEEDED    Dispense:  120 tablet    Refill:  0    Please remind patient that he needs to follow-up with Dr. Carlota Raspberry, per his discussion with Dr. Leward Quan on 5/27.

## 2015-03-17 ENCOUNTER — Other Ambulatory Visit: Payer: Self-pay | Admitting: Cardiovascular Disease

## 2015-03-20 ENCOUNTER — Other Ambulatory Visit: Payer: Self-pay | Admitting: Family Medicine

## 2015-03-20 DIAGNOSIS — G894 Chronic pain syndrome: Secondary | ICD-10-CM

## 2015-03-20 DIAGNOSIS — G4700 Insomnia, unspecified: Secondary | ICD-10-CM

## 2015-03-21 NOTE — Telephone Encounter (Signed)
Pt has been in several times recently but I don't see insomnia addressed so am sending req for trazadone to review along with Lyrica.

## 2015-03-21 NOTE — Telephone Encounter (Signed)
Pt needs to return to establish care with Dr. Carlota Raspberry to get further refills.

## 2015-03-22 NOTE — Telephone Encounter (Signed)
Faxed Rx. Couldn't reach pt by phone, no VM set up. Wrote note on Rx asking pharm to let pt know about need for OV to est care w/new provider.

## 2015-03-23 ENCOUNTER — Telehealth: Payer: Self-pay | Admitting: *Deleted

## 2015-03-23 NOTE — Telephone Encounter (Signed)
[facility] on [address] in [place] requesting a refill for patient for Humolog mix 50/50

## 2015-03-24 MED ORDER — INSULIN LISPRO PROT & LISPRO (50-50 MIX) 100 UNIT/ML KWIKPEN: PEN_INJECTOR | SUBCUTANEOUS | Status: DC

## 2015-03-24 NOTE — Telephone Encounter (Addendum)
Refill sent. Patient is due for follow up and should come back within the month. He is scheduled with Dr. Carlota Raspberry in August but please let patient know that if he has any other diabetes related problems that he should just come back for follow up. Thank you!

## 2015-03-24 NOTE — Addendum Note (Signed)
Addended by: Jaynee Eagles on: 03/24/2015 10:14 AM   Modules accepted: Orders

## 2015-03-27 ENCOUNTER — Other Ambulatory Visit: Payer: Self-pay | Admitting: Physician Assistant

## 2015-03-29 NOTE — Telephone Encounter (Signed)
Please contact this patient. Please clarify his follow-up plans. Dr. Billee Cashing Pherson saw this patient in May, and he was to return in 4 weeks to establish with Dr. Carlota Raspberry. Her note says that she provided him with a prescription for pain medication "for July" and he was to follow-up with Dr. Carlota Raspberry again in August.

## 2015-03-30 NOTE — Telephone Encounter (Signed)
Pt has an appt scheduled with Dr Carlota Raspberry on 04/30/15. It looks like Dr Leward Quan gave pt a 1 mos supply of tramadol on 03/09/15, so this request is about 10 days early. I called pt and he stated that he does have tramadol remaining, he is taking it 4 times daily. He stated that his wife is a Software engineer and he thinks she must have gone ahead and req'd it to make sure he has enough to last until his appt.

## 2015-03-31 NOTE — Telephone Encounter (Signed)
Got it.  Meds ordered this encounter  Medications  . traMADol (ULTRAM) 50 MG tablet    Sig: TAKE 2 TABLETS BY MOUTH EVERY 12 HOURS AS NEEDED    Dispense:  120 tablet    Refill:  0

## 2015-04-02 NOTE — Telephone Encounter (Signed)
Faxed

## 2015-04-05 ENCOUNTER — Other Ambulatory Visit: Payer: Self-pay | Admitting: Family Medicine

## 2015-04-22 ENCOUNTER — Other Ambulatory Visit: Payer: Self-pay | Admitting: Physician Assistant

## 2015-04-23 NOTE — Telephone Encounter (Signed)
Can someone please refill pt's Lyrica until his appt to est care w/Dr Carlota Raspberry on 8/15?

## 2015-04-24 NOTE — Telephone Encounter (Signed)
Refill provided. Patient needs to keep his appt with Dr. Carlota Raspberry.

## 2015-04-24 NOTE — Telephone Encounter (Signed)
Faxed

## 2015-04-30 ENCOUNTER — Ambulatory Visit (INDEPENDENT_AMBULATORY_CARE_PROVIDER_SITE_OTHER): Payer: Medicare Other | Admitting: Family Medicine

## 2015-04-30 ENCOUNTER — Encounter: Payer: Self-pay | Admitting: Family Medicine

## 2015-04-30 VITALS — BP 148/64 | HR 105 | Temp 98.5°F | Resp 18 | Ht 71.0 in | Wt 257.6 lb

## 2015-04-30 DIAGNOSIS — K219 Gastro-esophageal reflux disease without esophagitis: Secondary | ICD-10-CM

## 2015-04-30 DIAGNOSIS — E1165 Type 2 diabetes mellitus with hyperglycemia: Secondary | ICD-10-CM

## 2015-04-30 DIAGNOSIS — F329 Major depressive disorder, single episode, unspecified: Secondary | ICD-10-CM

## 2015-04-30 DIAGNOSIS — M546 Pain in thoracic spine: Secondary | ICD-10-CM | POA: Diagnosis not present

## 2015-04-30 DIAGNOSIS — M25511 Pain in right shoulder: Secondary | ICD-10-CM

## 2015-04-30 DIAGNOSIS — E1139 Type 2 diabetes mellitus with other diabetic ophthalmic complication: Secondary | ICD-10-CM | POA: Diagnosis not present

## 2015-04-30 DIAGNOSIS — IMO0002 Reserved for concepts with insufficient information to code with codable children: Secondary | ICD-10-CM

## 2015-04-30 DIAGNOSIS — G8929 Other chronic pain: Secondary | ICD-10-CM

## 2015-04-30 DIAGNOSIS — G894 Chronic pain syndrome: Secondary | ICD-10-CM | POA: Diagnosis not present

## 2015-04-30 DIAGNOSIS — F32A Depression, unspecified: Secondary | ICD-10-CM

## 2015-04-30 LAB — GLUCOSE, POCT (MANUAL RESULT ENTRY): POC GLUCOSE: 392 mg/dL — AB (ref 70–99)

## 2015-04-30 LAB — POCT GLYCOSYLATED HEMOGLOBIN (HGB A1C): Hemoglobin A1C: 10.4

## 2015-04-30 MED ORDER — SERTRALINE HCL 100 MG PO TABS: 100.0000 mg | ORAL_TABLET | Freq: Every day | ORAL | Status: DC

## 2015-04-30 MED ORDER — DEXLANSOPRAZOLE 30 MG PO CPDR: 30.0000 mg | DELAYED_RELEASE_CAPSULE | Freq: Every day | ORAL | Status: DC

## 2015-04-30 MED ORDER — OXYCODONE HCL 5 MG PO TABS: ORAL_TABLET | ORAL | Status: DC

## 2015-04-30 NOTE — Patient Instructions (Addendum)
I would advise cutting back on Ambien or 1/2 pill at the most to decrease risk of falls. Make sure there are no loose area rugs or other objects at home that may interfere with your walking or may cause you to fall.  Use walker or wheelchair if needed to lessen risk risk of fall with long distances.   Blood sugar is very high today. I will refer you to endocrinologist, but if your blood sugar is too high to read at home, nausea, headaches, abdominal pain or any change in your baseline symptoms, call 911 or go to emergency room.   Continue the lyrica 3 times per day,, and zoloft for the depression. If you feel that depression is worsening - return here, emergency room or [place] for other options.   As you have refused referral to pain management as we discussed today, I can provide you enough of the oxycodone for the next 2 weeks to be used up to 3 times per day. I encourage you to seek care with a provider who can provide pain management either in this area or in [place]. If you change your mind about referral to a pain management specialist, or need names of providers closer to your home- please let me know and I will be happy to refer you.  Due to use of zoloft and risk of seizures, I recommend against taking tramadol.   I ordered the [place] for heartburn, but if this is not covered by your insurance - we do have other options.   Return to the clinic or go to the nearest emergency room if any of your symptoms worsen or new symptoms occur.   Hyperglycemia Hyperglycemia occurs when the glucose (sugar) in your blood is too high. Hyperglycemia can happen for many reasons, but it most often happens to people who do not know they have diabetes or are not managing their diabetes properly.  CAUSES  Whether you have diabetes or not, there are other causes of hyperglycemia. Hyperglycemia can occur when you have diabetes, but it can also occur in other situations that you might not be as aware  of, such as: Diabetes  If you have diabetes and are having problems controlling your blood glucose, hyperglycemia could occur because of some of the following reasons:  Not following your meal plan.  Not taking your diabetes medications or not taking it properly.  Exercising less or doing less activity than you normally do.  Being sick. Pre-diabetes  This cannot be ignored. Before people develop Type 2 diabetes, they almost always have "pre-diabetes." This is when your blood glucose levels are higher than normal, but not yet high enough to be diagnosed as diabetes. Research has shown that some long-term damage to the body, especially the heart and circulatory system, may already be occurring during pre-diabetes. If you take action to manage your blood glucose when you have pre-diabetes, you may delay or prevent Type 2 diabetes from developing. Stress  If you have diabetes, you may be "diet" controlled or on oral medications or insulin to control your diabetes. However, you may find that your blood glucose is higher than usual in the hospital whether you have diabetes or not. This is often referred to as "stress hyperglycemia." Stress can elevate your blood glucose. This happens because of hormones put out by the body during times of stress. If stress has been the cause of your high blood glucose, it can be followed regularly by your caregiver. That way he/she can  make sure your hyperglycemia does not continue to get worse or progress to diabetes. Steroids  Steroids are medications that act on the infection fighting system (immune system) to block inflammation or infection. One side effect can be a rise in blood glucose. Most people can produce enough extra insulin to allow for this rise, but for those who cannot, steroids make blood glucose levels go even higher. It is not unusual for steroid treatments to "uncover" diabetes that is developing. It is not always possible to determine if the  hyperglycemia will go away after the steroids are stopped. A special blood test called an A1c is sometimes done to determine if your blood glucose was elevated before the steroids were started. SYMPTOMS  Thirsty.  Frequent urination.  Dry mouth.  Blurred vision.  Tired or fatigue.  Weakness.  Sleepy.  Tingling in feet or leg. DIAGNOSIS  Diagnosis is made by monitoring blood glucose in one or all of the following ways:  A1c test. This is a chemical found in your blood.  Fingerstick blood glucose monitoring.  Laboratory results. TREATMENT  First, knowing the cause of the hyperglycemia is important before the hyperglycemia can be treated. Treatment may include, but is not be limited to:  Education.  Change or adjustment in medications.  Change or adjustment in meal plan.  Treatment for an illness, infection, etc.  More frequent blood glucose monitoring.  Change in exercise plan.  Decreasing or stopping steroids.  Lifestyle changes. HOME CARE INSTRUCTIONS   Test your blood glucose as directed.  Exercise regularly. Your caregiver will give you instructions about exercise. Pre-diabetes or diabetes which comes on with stress is helped by exercising.  Eat wholesome, balanced meals. Eat often and at regular, fixed times. Your caregiver or nutritionist will give you a meal plan to guide your sugar intake.  Being at an ideal weight is important. If needed, losing as little as 10 to 15 pounds may help improve blood glucose levels. SEEK MEDICAL CARE IF:   You have questions about medicine, activity, or diet.  You continue to have symptoms (problems such as increased thirst, urination, or weight gain). SEEK IMMEDIATE MEDICAL CARE IF:   You are vomiting or have diarrhea.  Your breath smells fruity.  You are breathing faster or slower.  You are very sleepy or incoherent.  You have numbness, tingling, or pain in your feet or hands.  You have chest pain.  Your  symptoms get worse even though you have been following your caregiver's orders.  If you have any other questions or concerns. Document Released: 02/25/2001 Document Revised: 11/24/2011 Document Reviewed: 12/29/2011 [facility] Patient Information 2015 [place], [place]. This information is not intended to replace advice given to you by your health care provider. Make sure you discuss any questions you have with your health care provider.
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
## 2015-05-01 LAB — COMPLETE METABOLIC PANEL WITH GFR
ALT: 9 U/L (ref 9–46)
AST: 13 U/L (ref 10–35)
Albumin: 3.8 g/dL (ref 3.6–5.1)
Alkaline Phosphatase: 148 U/L — ABNORMAL HIGH (ref 40–115)
BILIRUBIN TOTAL: 0.7 mg/dL (ref 0.2–1.2)
BUN: 9 mg/dL (ref 7–25)
CALCIUM: 9.9 mg/dL (ref 8.6–10.3)
CHLORIDE: 99 mmol/L (ref 98–110)
CO2: 23 mmol/L (ref 20–31)
CREATININE: 0.93 mg/dL (ref 0.70–1.25)
GFR, Est African American: >89 mL/min (ref >=60)
GFR, Est Non African American: 88 mL/min (ref >=60)
Glucose, Bld: 367 mg/dL — ABNORMAL HIGH (ref 65–99)
Potassium: 3.8 mmol/L (ref 3.5–5.3)
Sodium: 134 mmol/L — ABNORMAL LOW (ref 135–146)
Total Protein: 6.5 g/dL (ref 6.1–8.1)

## 2015-05-01 LAB — LIPID PANEL
CHOLESTEROL: 127 mg/dL (ref 125–200)
HDL: 20 mg/dL — ABNORMAL LOW (ref >=40)
LDL Cholesterol: 33 mg/dL (ref <130)
Total CHOL/HDL Ratio: 6.4 Ratio — ABNORMAL HIGH (ref <=5.0)
Triglycerides: 369 mg/dL — ABNORMAL HIGH (ref <150)
VLDL: 74 mg/dL — AB (ref <30)

## 2015-05-05 ENCOUNTER — Emergency Department: Payer: Medicare Other

## 2015-05-05 ENCOUNTER — Encounter: Payer: Self-pay | Admitting: Emergency Medicine

## 2015-05-05 ENCOUNTER — Other Ambulatory Visit: Payer: Self-pay

## 2015-05-05 ENCOUNTER — Emergency Department
Admission: EM | Admit: 2015-05-05 | Discharge: 2015-05-05 | Disposition: A | Discharge status: Home / Self Care | Payer: Medicare Other | Attending: Emergency Medicine | Admitting: Emergency Medicine

## 2015-05-05 DIAGNOSIS — Y9389 Activity, other specified: Secondary | ICD-10-CM | POA: Insufficient documentation

## 2015-05-05 DIAGNOSIS — Z87891 Personal history of nicotine dependence: Secondary | ICD-10-CM | POA: Diagnosis not present

## 2015-05-05 DIAGNOSIS — Y92000 Kitchen of unspecified non-institutional (private) residence as  the place of occurrence of the external cause: Secondary | ICD-10-CM | POA: Diagnosis not present

## 2015-05-05 DIAGNOSIS — Z79899 Other long term (current) drug therapy: Secondary | ICD-10-CM | POA: Diagnosis not present

## 2015-05-05 DIAGNOSIS — Z794 Long term (current) use of insulin: Secondary | ICD-10-CM | POA: Insufficient documentation

## 2015-05-05 DIAGNOSIS — W01198A Fall on same level from slipping, tripping and stumbling with subsequent striking against other object, initial encounter: Secondary | ICD-10-CM | POA: Diagnosis not present

## 2015-05-05 DIAGNOSIS — S4991XA Unspecified injury of right shoulder and upper arm, initial encounter: Secondary | ICD-10-CM | POA: Diagnosis present

## 2015-05-05 DIAGNOSIS — Z791 Long term (current) use of non-steroidal anti-inflammatories (NSAID): Secondary | ICD-10-CM | POA: Insufficient documentation

## 2015-05-05 DIAGNOSIS — M25562 Pain in left knee: Secondary | ICD-10-CM | POA: Diagnosis not present

## 2015-05-05 DIAGNOSIS — E1165 Type 2 diabetes mellitus with hyperglycemia: Secondary | ICD-10-CM | POA: Diagnosis not present

## 2015-05-05 DIAGNOSIS — S8992XA Unspecified injury of left lower leg, initial encounter: Secondary | ICD-10-CM | POA: Diagnosis not present

## 2015-05-05 DIAGNOSIS — S40021A Contusion of right upper arm, initial encounter: Secondary | ICD-10-CM | POA: Insufficient documentation

## 2015-05-05 DIAGNOSIS — Y998 Other external cause status: Secondary | ICD-10-CM | POA: Insufficient documentation

## 2015-05-05 LAB — CBC
HEMATOCRIT: 38.1 % — AB (ref 40.0–52.0)
Hemoglobin: 12.7 g/dL — ABNORMAL LOW (ref 13.0–18.0)
MCH: 32.8 pg (ref 26.0–34.0)
MCHC: 33.4 g/dL (ref 32.0–36.0)
MCV: 98.3 fL (ref 80.0–100.0)
Platelets: 274 10*3/uL (ref 150–440)
RBC: 3.88 MIL/uL — ABNORMAL LOW (ref 4.40–5.90)
RDW: 15.7 % — AB (ref 11.5–14.5)
WBC: 7.9 10*3/uL (ref 3.8–10.6)

## 2015-05-05 LAB — URINALYSIS COMPLETE WITH MICROSCOPIC (ARMC ONLY)
BILIRUBIN URINE: NEGATIVE
Bacteria, UA: NONE SEEN
Glucose, UA: >500 mg/dL — AB
KETONES UR: NEGATIVE mg/dL
Nitrite: NEGATIVE
PH: 6 (ref 5.0–8.0)
Protein, ur: NEGATIVE mg/dL
SQUAMOUS EPITHELIAL / LPF: NONE SEEN
Specific Gravity, Urine: 1.023 (ref 1.005–1.030)

## 2015-05-05 LAB — COMPREHENSIVE METABOLIC PANEL
ALT: 12 U/L — ABNORMAL LOW (ref 17–63)
AST: 27 U/L (ref 15–41)
Albumin: 3.8 g/dL (ref 3.5–5.0)
Alkaline Phosphatase: 137 U/L — ABNORMAL HIGH (ref 38–126)
Anion gap: 10 (ref 5–15)
BILIRUBIN TOTAL: 0.4 mg/dL (ref 0.3–1.2)
BUN: 10 mg/dL (ref 6–20)
CO2: 20 mmol/L — ABNORMAL LOW (ref 22–32)
Calcium: 9.4 mg/dL (ref 8.9–10.3)
Chloride: 101 mmol/L (ref 101–111)
Creatinine, Ser: 0.86 mg/dL (ref 0.61–1.24)
Glucose, Bld: 406 mg/dL — ABNORMAL HIGH (ref 65–99)
POTASSIUM: 3.9 mmol/L (ref 3.5–5.1)
Sodium: 131 mmol/L — ABNORMAL LOW (ref 135–145)
TOTAL PROTEIN: 6.6 g/dL (ref 6.5–8.1)

## 2015-05-05 LAB — TROPONIN I

## 2015-05-05 LAB — GLUCOSE, CAPILLARY
Glucose-Capillary: 371 mg/dL — ABNORMAL HIGH (ref 65–99)
Glucose-Capillary: 270 mg/dL — ABNORMAL HIGH (ref 65–99)

## 2015-05-05 MED ORDER — OXYCODONE HCL 5 MG PO TABS: 5.0000 mg | ORAL_TABLET | Freq: Four times a day (QID) | ORAL | Status: DC | PRN

## 2015-05-05 MED ORDER — OXYCODONE HCL 5 MG PO TABS
10.0000 mg | ORAL_TABLET | ORAL | Status: AC
Start: 2015-05-05 — End: 2015-05-05
  Administered 2015-05-05: 10 mg via ORAL
  Filled 2015-05-05: qty 2

## 2015-05-05 MED ORDER — INSULIN ASPART 100 UNIT/ML ~~LOC~~ SOLN
30.0000 [IU] | Freq: Once | SUBCUTANEOUS | Status: AC
  Administered 2015-05-05: 30 [IU] via SUBCUTANEOUS
  Filled 2015-05-05: qty 30

## 2015-05-05 MED ORDER — INSULIN ASPART 100 UNIT/ML ~~LOC~~ SOLN: 18.0000 [IU] | Freq: Once | SUBCUTANEOUS | Status: DC

## 2015-05-05 MED ORDER — SODIUM CHLORIDE 0.9 % IV BOLUS (SEPSIS): 1000.0000 mL | Freq: Once | INTRAVENOUS | Status: DC

## 2015-05-05 NOTE — Discharge Instructions (Signed)
Contusion A contusion is a deep bruise. Contusions are the result of an injury that caused bleeding under the skin. The contusion may turn blue, purple, or yellow. Minor injuries will give you a painless contusion, but more severe contusions may stay painful and swollen for a few weeks.  CAUSES  A contusion is usually caused by a blow, trauma, or direct force to an area of the body. SYMPTOMS   Swelling and redness of the injured area.  Bruising of the injured area.  Tenderness and soreness of the injured area.  Pain. DIAGNOSIS  The diagnosis can be made by taking a history and physical exam. An X-ray, CT scan, or MRI may be needed to determine if there were any associated injuries, such as fractures. TREATMENT  Specific treatment will depend on what area of the body was injured. In general, the best treatment for a contusion is resting, icing, elevating, and applying cold compresses to the injured area. Over-the-counter medicines may also be recommended for pain control. Ask your caregiver what the best treatment is for your contusion. HOME CARE INSTRUCTIONS   Put ice on the injured area.  Put ice in a plastic bag.  Place a towel between your skin and the bag.  Leave the ice on for 15-20 minutes, 3-4 times a day, or as directed by your health care provider.  Only take over-the-counter or prescription medicines for pain, discomfort, or fever as directed by your caregiver. Your caregiver may recommend avoiding anti-inflammatory medicines (aspirin, ibuprofen, and naproxen) for 48 hours because these medicines may increase bruising.  Rest the injured area.  If possible, elevate the injured area to reduce swelling. SEEK IMMEDIATE MEDICAL CARE IF:   You have increased bruising or swelling.  You have pain that is getting worse.  Your swelling or pain is not relieved with medicines. MAKE SURE YOU:   Understand these instructions.  Will watch your condition.  Will get help right  away if you are not doing well or get worse. Document Released: 06/11/2005 Document Revised: 09/06/2013 Document Reviewed: 07/07/2011 [facility] Patient Information 2015 [place], [place]. This information is not intended to replace advice given to you by your health care provider. Make sure you discuss any questions you have with your health care provider.  Insulin Treatment for Diabetes Diabetes is a disease that does not go away (chronic). It occurs when the body does not properly use the sugar (glucose) that is released from food after it is digested. Glucose levels are controlled by a hormone called insulin, which is made by your pancreas. Depending on the type of diabetes you have, either of the following will apply:   The pancreas does not make any insulin (type 1 diabetes).  The pancreas makes too little insulin, and the body cannot respond normally to the insulin that is made (type 2 diabetes). Without insulin, death can occur. However, with the addition of insulin, blood sugar monitoring, and treatment, someone with diabetes can live a full and productive life. This document will discuss the role of insulin in your treatment and provide information about its use.  HOW IS INSULIN GIVEN? Insulin is a medicine that can only be given by injection. Taking it by mouth makes it inactive because of the acid in your stomach. Insulin is injected under the skin by a syringe and needle, an insulin pen, a pump, or a jet injector. Your dose will be determined by your health care provider based on your individual needs. You will also be given  guidance on which method of giving insulin is right for you. Remember that if you give insulin with a needle and syringe, you must do so using only a special insulin syringe made for this purpose. WHERE ON THE BODY SHOULD INSULIN BE INJECTED? Insulin is injected into the fatty layer of tissue just under your skin. Good places to inject insulin include the upper arm, the  front and outer area of the thigh, the hips, and the abdomen. Giving your insulin in the abdomen is preferred because this provides the most rapid and consistent absorption. Avoid the area 2 inches (5 cm) around the navel and avoid injecting into areas on your body with scar tissue. In addition, it is important to rotate your injection sites with every shot to prevent irritation and improve absorption. WHAT ARE THE DIFFERENT TYPES OF INSULIN?  If you have type 1 diabetes, you must take insulin to stay alive. Your body does not produce it. If you have type 2 diabetes, you might require insulin in addition to, or instead of, other medicines. In either case, proper use of insulin is critical to control your diabetes.  There are a number of different types of insulin. Usually, you will give yourself injections, though others can be trained to give them to you. Some people have an insulin pump that delivers insulin continuously through a tube (cannula) that is placed under the skin. Using insulin requires that you check your blood sugar several times a day. The exact number of times and time of day to check will vary depending on your type of diabetes, your type of insulin, and treatment goals. Your health care provider will direct you.  Generally, different insulins have different properties. The following is a general guide. Specifics will vary by product, and new products are introduced periodically.   Rapid-acting insulin starts working quickly (in as little as 5 minutes) and wears off in 4 to 6 hours (sometimes longer). This type of insulin works well when taken just before a meal to bring your blood sugar quickly back to normal.   Short-acting insulin starts working in about 30 minutes and can last 6 to 10 hours. This type of insulin should be taken about 30 minutes before you start eating a meal.  Intermediate-acting insulin starts working in 1-2 hours and wears off after about 10 to 18 hours. This  insulin will lower your blood sugar for a longer period of time, but it will not be as effective in lowering your blood sugar right after a meal.   Long-acting insulin mimics the small amount of insulin that your pancreas usually produces throughout the day. You need to have some insulin present at all times. It is crucial to the metabolism of brain cells and other cells. Long-acting insulin is meant to be used either once or twice a day. It is usually used in combination with other types of insulin, or in combination with other diabetes medicines.  Discuss the type of insulin you are taking with your health care provider or pharmacist. You will then be aware of when the insulin can be expected to peak and when it will wear off. This is important to know so you can plan for meal times and periods of exercise.  Your health care provider will usually have a strategy in mind when treating you with insulin. This will vary with your type of diabetes, your diabetes treatment goals, and your health history. It is important that you understand this strategy so  you can be an active partner in treating your diabetes. Here are some terms you might hear:   Basal insulin. This refers to the small amount of insulin that needs to be present in your blood at all times. Sometimes oral medicines will be enough. For other people, and especially for people with type 1 diabetes, insulin is needed. Usually, intermediate-acting or long-acting insulin is used once or twice a day to accomplish this.   Prandial (meal-related) insulin. Your blood sugar will rise rapidly after a meal. Rapid-acting or short-acting insulin can be used right before the meal to bring your blood sugar back to normal quickly. You might be instructed to adjust the amount of insulin depending on how much carbohydrate (starch) is in your meal.   Corrective insulin. You might be instructed to check your blood sugar at certain times of the day. You then  might use a small amount of rapid-acting or short-acting insulin to bring the blood sugar down to normal if it is elevated.   Tight control (also called intensive therapy). Tight control means keeping your blood sugar as close to your target as possible and keeping it from going too high after meals. People with tight control of their diabetes are shown to have fewer long-term problems from their diabetes.   Glycohemoglobin (also called glyco, glycosylated hemoglobin, hemoglobin A1c, or A1c) level. This measures how well your blood sugar has been controlled during the past 1 to 3 months. It helps your health care provider see how effective your treatment is and decide if any changes are needed. Your health care provider will discuss your target glycohemoglobin level with you.  Insulin treatment requires your careful attention. Treatment plans will be different for different people. Some people do well with a simple program. Others require more complicated programs, with multiple insulin injections daily. You will work with your health care provider to develop the best program for you. Regardless of your insulin treatment plan, you must also do your best on weight control, diet and food choices, exercise, blood pressure control, cholesterol control, and stress levels.  WHAT ARE THE SIDE EFFECTS OF INSULIN? Although insulin treatment is important, it does have some side effects, such as:   Insulin can cause your blood sugar to go too low (hypoglycemia).   Weight gain can occur.   Improper injection technique can cause hypoglycemia, blood sugar to go too high (hyperglycemia), skin injury or irritation, or other problems. You must learn to inject insulin properly. Document Released: 11/28/2008 Document Revised: 01/16/2014 Document Reviewed: 02/13/2013 [facility] Patient Information 2015 [place], [place]. This information is not intended to replace advice given to you by your health care provider. Make  sure you discuss any questions you have with your health care provider.
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
## 2015-05-05 NOTE — ED Notes (Signed)
States was walking and felt dizzy and fell last night, states bloodsugar was high at that time, read HI on meter, this am blood sugar 379, denies LOC last night, denies chest pain or SOB at present, denies dizziness in chair, pain R upper arm and L knee

## 2015-05-07 ENCOUNTER — Telehealth: Payer: Self-pay | Admitting: Family Medicine

## 2015-05-07 NOTE — Telephone Encounter (Signed)
Patient and his wife dropped off FMLA forms on 04/30/2015. Initially, there was some confusion about these forms. Although the patient's wife is his caregiver and she is the one who needs the FMLA for her job, the original phone message about this paperwork was documented in patient's wife chart and she is not a patient at [facility]. Patient's wife's name and DOB was also written on the payment form in the "patient" section when really it should've been the patient's name written there. Called patient to get more information. He states that he is a former Dr. Leward Quan patient who now sees Dr. Carlota Raspberry. He states that he has one leg, diabetes, and a whole host of other problems and his wife helps take care of him. Dr. Carlota Raspberry, can you review and sign these forms and send them back to the [facility] department? Please and thank you!   -Delana Meyer

## 2015-05-08 NOTE — Telephone Encounter (Signed)
I can complete these, but based on last visit - please verify whether he plans on having me as his primary provider, as we discussed that I would not be able to provide long-term pain medications for him, and he refused to be referred to pain management. I know one option was that he was looking into another primary provider that would do long-term pain management. Again, I do not mind completing this paperwork, but I have had only one visit with him, and at that visit there was some question whether or not he would be seeking care with a different provider.

## 2015-05-10 NOTE — Telephone Encounter (Signed)
Have we heard back from him yet? Thanks. -JG

## 2015-05-12 NOTE — Telephone Encounter (Signed)
I was off Friday. Will call on Monday.

## 2015-05-14 ENCOUNTER — Other Ambulatory Visit: Payer: Self-pay

## 2015-05-14 NOTE — Telephone Encounter (Signed)
Dr Carlota Raspberry, pharm faxed req stating that the pt would like you to write him Rxs for his anti-nausea medications zofran and promethazine. I don't see that we have ever Rxd it before, and don't see that it was specifically addressed in OV notes.

## 2015-05-15 ENCOUNTER — Other Ambulatory Visit: Payer: Self-pay | Admitting: Physician Assistant

## 2015-05-15 NOTE — Telephone Encounter (Signed)
More info - I do not see where we prescribed the Zofran, and Dr. Leward Quan ordered Reglan in January but I do not see phenergan or Zofran listed.  Should not be on both, and need reasons for their use as well as frequency.  Was this prescribed by gastroenterology?  Additionally - see phone note about FMLA paperwork.  Unsure if he planned on following up with me or other PCP based on our last visit and discussion of pain medications.

## 2015-05-16 ENCOUNTER — Encounter: Payer: Self-pay | Admitting: Family Medicine

## 2015-05-16 NOTE — Telephone Encounter (Signed)
Refilled for 30 days, as per other notes, not sure of his plan for follow-up.

## 2015-05-16 NOTE — Telephone Encounter (Signed)
Dr. Carlota Raspberry,   This patient was just seen on 8/15, he is looking for a prescription of Trazodone I didn't see anything in note can refill? How many?  Thank you

## 2015-05-16 NOTE — Telephone Encounter (Signed)
Dr. Carlota Raspberry is coming in at 4pm today, will check on ppwk then. Also need to know (per Dr. Vonna Kotyk message), if pt intends to find a PCP elsewhere. Called patient, no answer and no voicemail

## 2015-05-16 NOTE — Telephone Encounter (Signed)
Patient's wife is upset because the paper work hasn't been faxed and she states it must be done today. I explained to her that I will try my best to find someone to help

## 2015-05-17 ENCOUNTER — Other Ambulatory Visit: Payer: Self-pay | Admitting: Family Medicine

## 2015-05-25 ENCOUNTER — Other Ambulatory Visit: Payer: Self-pay | Admitting: Family Medicine

## 2015-05-25 DIAGNOSIS — G894 Chronic pain syndrome: Secondary | ICD-10-CM

## 2015-05-28 NOTE — Telephone Encounter (Signed)
Dr Carlota Raspberry, did you want to Rx this for pt? He recently est care w/you (from Dr Leward Quan).

## 2015-05-29 MED ORDER — ONDANSETRON HCL 4 MG PO TABS: 4.0000 mg | ORAL_TABLET | Freq: Three times a day (TID) | ORAL | Status: DC | PRN

## 2015-05-29 NOTE — Telephone Encounter (Signed)
Refilled zofran (less side effects except for constipation compared to Phenergan).  Follow-up as planned, and can discuss gastroenterologist he has seen in the past at that time. If frequent use of  this medicine, should have regular follow-up with gastroenterology

## 2015-05-29 NOTE — Telephone Encounter (Signed)
[facility] faxed.

## 2015-05-29 NOTE — Telephone Encounter (Signed)
Refilled

## 2015-05-29 NOTE — Telephone Encounter (Signed)
Spoke to pt and he reported that he is not sure who originally Rxd these anti-nausea meds, but he is not currently seeing his GI doctor and would like his PCP to Rx. 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 He stated that if Dr Carlota Raspberry only wants to Rx one, the phenergan probably works best for him. He does plan to f/up w/Dr Carlota Raspberry next week.

## 2015-05-31 ENCOUNTER — Other Ambulatory Visit: Payer: Self-pay

## 2015-05-31 DIAGNOSIS — E881 Lipodystrophy, not elsewhere classified: Secondary | ICD-10-CM | POA: Diagnosis not present

## 2015-05-31 DIAGNOSIS — Z6838 Body mass index (BMI) 38.0-38.9, adult: Secondary | ICD-10-CM | POA: Diagnosis not present

## 2015-05-31 DIAGNOSIS — E669 Obesity, unspecified: Secondary | ICD-10-CM | POA: Diagnosis not present

## 2015-05-31 DIAGNOSIS — E1142 Type 2 diabetes mellitus with diabetic polyneuropathy: Secondary | ICD-10-CM | POA: Diagnosis not present

## 2015-05-31 DIAGNOSIS — I10 Essential (primary) hypertension: Secondary | ICD-10-CM | POA: Diagnosis not present

## 2015-05-31 DIAGNOSIS — E785 Hyperlipidemia, unspecified: Secondary | ICD-10-CM | POA: Diagnosis not present

## 2015-05-31 DIAGNOSIS — E1165 Type 2 diabetes mellitus with hyperglycemia: Secondary | ICD-10-CM | POA: Diagnosis not present

## 2015-05-31 NOTE — Telephone Encounter (Signed)
Dr Carlota Raspberry, pharm has req'd RFs of reglan. It is on pt's med list at his OV w/you in Aug, but don't see it specifically addressed. Do you want to have pt continue it?

## 2015-06-01 NOTE — Telephone Encounter (Signed)
More info please. I just refilled his Zofran, and on that note I thought we discussed he was on Zofran or this medicine. Is he taking Reglan and Zofran?

## 2015-06-04 ENCOUNTER — Ambulatory Visit (INDEPENDENT_AMBULATORY_CARE_PROVIDER_SITE_OTHER): Payer: Medicare Other | Admitting: Family Medicine

## 2015-06-04 ENCOUNTER — Encounter: Payer: Self-pay | Admitting: Family Medicine

## 2015-06-04 ENCOUNTER — Telehealth: Payer: Self-pay | Admitting: Family Medicine

## 2015-06-04 VITALS — BP 116/69 | HR 68 | Temp 98.8°F | Resp 18

## 2015-06-04 DIAGNOSIS — E119 Type 2 diabetes mellitus without complications: Secondary | ICD-10-CM

## 2015-06-04 DIAGNOSIS — K2100 Gastro-esophageal reflux disease with esophagitis, without bleeding: Secondary | ICD-10-CM

## 2015-06-04 DIAGNOSIS — R112 Nausea with vomiting, unspecified: Secondary | ICD-10-CM

## 2015-06-04 DIAGNOSIS — G894 Chronic pain syndrome: Secondary | ICD-10-CM | POA: Diagnosis not present

## 2015-06-04 MED ORDER — METOCLOPRAMIDE HCL 10 MG PO TABS: ORAL_TABLET | ORAL | Status: DC

## 2015-06-04 MED ORDER — PROMETHAZINE HCL 25 MG PO TABS: 25.0000 mg | ORAL_TABLET | Freq: Three times a day (TID) | ORAL | Status: DC | PRN
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
## 2015-06-04 NOTE — Patient Instructions (Addendum)
I will refer you to Dr. Benson Norway  - can discuss Reglan, phenergan, zofran, dexilant for nausea/vomiting and discuss his symptoms and abdominal pain. Can also discuss if Cymbalta an option for him in place of Zoloft for derpession and chronic with his gastrointestinal issues.    Follow up in next 6 weeks. Return to the clinic or go to the nearest emergency room if any of your symptoms worsen or new symptoms occur.

## 2015-06-04 NOTE — Telephone Encounter (Signed)
Mr. [name] said Dr. Carlota Raspberry was supposed to give him the name of a provider he could see in [facility] for Pain Management. They'd like a phone call with this information. Also, he had to leave before his 6wk follow-up appointment was made, please let him know his next appointment with Dr. Carlota Raspberry is on October 31st, 2016 at 9:15am. Pt ph# [phone] Thank you.

## 2015-06-05 NOTE — Telephone Encounter (Signed)
Dr. Carlota Raspberry,  I spoke with [name], he states he takes the Reglan everyday and the Zofran as needed.  Please advise?

## 2015-06-05 NOTE — Telephone Encounter (Signed)
Will discuss with Dr. Tamala Julian tomorrow as I believe she is aware of that provider's name.

## 2015-06-05 NOTE — Telephone Encounter (Signed)
Refilled at [facility] yesterday.

## 2015-06-06 DIAGNOSIS — R1084 Generalized abdominal pain: Secondary | ICD-10-CM | POA: Diagnosis not present

## 2015-06-06 DIAGNOSIS — K219 Gastro-esophageal reflux disease without esophagitis: Secondary | ICD-10-CM | POA: Diagnosis not present

## 2015-06-08 DIAGNOSIS — E1165 Type 2 diabetes mellitus with hyperglycemia: Secondary | ICD-10-CM | POA: Diagnosis not present

## 2015-06-18 ENCOUNTER — Other Ambulatory Visit: Payer: Self-pay | Admitting: Family Medicine

## 2015-06-19 ENCOUNTER — Other Ambulatory Visit: Payer: Self-pay

## 2015-06-19 NOTE — Telephone Encounter (Signed)
Pt states  Dr Carlota Raspberry was suppose to talk to patients diabetic doctor and order Cymbalta,but They have not heard anything???   Best phone for pt is [phone]   [facility] in [place]

## 2015-06-19 NOTE — Telephone Encounter (Signed)
Dr Greene please advise.  

## 2015-06-20 NOTE — Telephone Encounter (Signed)
Actually we discussed last visit concern for his GI motility/gastrointestinal issues and use of Cymbalta. The plan was for him to discuss this medication at follow up with gastroenterology.  Would not start Cymbalta until GI gave the go ahead.

## 2015-06-20 NOTE — Telephone Encounter (Signed)
Spoke with pt, he states his GI doctor said it was ok. I will see if medical records can find the notes.

## 2015-06-21 NOTE — Telephone Encounter (Signed)
Did not see any recent GI records for this patient. Checked scan box and media tab.

## 2015-06-23 NOTE — Telephone Encounter (Signed)
Mohammed Kindle, MD is pain specialist in [place].  He is affiliated with [facility] and [facility].  Also, Dr. Lelon Huh at [facility] manages chronic pain frequently; not sure if he is taking on new chronic pain patients.

## 2015-06-23 NOTE — Telephone Encounter (Signed)
Dr. Tamala Julian, Do you still have the name of the primary provider in [facility] that may also provide pain management through his office? This patient may want to set up primary care there as he preferred to have pain management through primary care office and not at pain management. Thanks.  -JG.

## 2015-06-26 ENCOUNTER — Other Ambulatory Visit: Payer: Self-pay

## 2015-06-26 MED ORDER — VITAMIN D (ERGOCALCIFEROL) 1.25 MG (50000 UNIT) PO CAPS: ORAL_CAPSULE | ORAL | Status: DC

## 2015-06-27 NOTE — Telephone Encounter (Signed)
Pharmacy called about Cymbalta. Pt states he was Okayed by GI doctor to be prescribed Cymbalta. Pt states Dr. Carlota Raspberry was supposed to send prescription in to his pharmacy for the cymbalta after GI doctor said it was ok to take. He is currently at the pharmacy inquiring of it. Dr. Carlota Raspberry is out of office today. Please advise

## 2015-06-28 NOTE — Telephone Encounter (Signed)
Please find out what is going on with the GI referral.  [place]'s notes should populate into EPIC.  I can't tell if he has gone to GI appointment or not.

## 2015-06-28 NOTE — Telephone Encounter (Signed)
I have advised patient, he will think about it and call us back, let us know if he would like referrals to one of these two physicians. To you FYI

## 2015-07-03 DIAGNOSIS — E1165 Type 2 diabetes mellitus with hyperglycemia: Secondary | ICD-10-CM | POA: Diagnosis not present

## 2015-07-03 DIAGNOSIS — E669 Obesity, unspecified: Secondary | ICD-10-CM | POA: Diagnosis not present

## 2015-07-03 DIAGNOSIS — Z6838 Body mass index (BMI) 38.0-38.9, adult: Secondary | ICD-10-CM | POA: Diagnosis not present

## 2015-07-03 DIAGNOSIS — E1142 Type 2 diabetes mellitus with diabetic polyneuropathy: Secondary | ICD-10-CM | POA: Diagnosis not present

## 2015-07-03 DIAGNOSIS — E785 Hyperlipidemia, unspecified: Secondary | ICD-10-CM | POA: Diagnosis not present

## 2015-07-03 DIAGNOSIS — I10 Essential (primary) hypertension: Secondary | ICD-10-CM | POA: Diagnosis not present

## 2015-07-04 NOTE — Telephone Encounter (Signed)
Dr. Carlota Raspberry, pt saw Dr. Benson Norway and Dr. Benson Norway cleared him to take Cymbalta and faxed over his note (should be in your box). Pt is anxious about wanting to re start Cymbalta. Please advise. Thanks

## 2015-07-08 ENCOUNTER — Telehealth: Payer: Self-pay | Admitting: Radiology

## 2015-07-09 DIAGNOSIS — Z23 Encounter for immunization: Secondary | ICD-10-CM | POA: Diagnosis not present

## 2015-07-16 ENCOUNTER — Encounter: Payer: Self-pay | Admitting: Family Medicine

## 2015-07-16 ENCOUNTER — Other Ambulatory Visit: Payer: Self-pay | Admitting: Family Medicine

## 2015-07-16 ENCOUNTER — Ambulatory Visit (INDEPENDENT_AMBULATORY_CARE_PROVIDER_SITE_OTHER): Payer: Medicare Other | Admitting: Family Medicine

## 2015-07-16 VITALS — BP 128/58 | HR 67 | Temp 98.5°F | Resp 16 | Ht 72.0 in | Wt 280.0 lb

## 2015-07-16 DIAGNOSIS — Z794 Long term (current) use of insulin: Secondary | ICD-10-CM | POA: Diagnosis not present

## 2015-07-16 DIAGNOSIS — F329 Major depressive disorder, single episode, unspecified: Secondary | ICD-10-CM

## 2015-07-16 DIAGNOSIS — G894 Chronic pain syndrome: Secondary | ICD-10-CM | POA: Diagnosis not present

## 2015-07-16 DIAGNOSIS — F32A Depression, unspecified: Secondary | ICD-10-CM

## 2015-07-16 DIAGNOSIS — E119 Type 2 diabetes mellitus without complications: Secondary | ICD-10-CM

## 2015-07-16 MED ORDER — DULOXETINE HCL 30 MG PO CPEP
30.0000 mg | ORAL_CAPSULE | Freq: Every day | ORAL | Status: DC
Start: 2015-07-16 — End: 2015-09-13

## 2015-07-16 NOTE — Patient Instructions (Addendum)
I will refer you to Dr. Primus Bravo in [place] for pain management. If you chose to see Lelon Huh, instead - should not need a referral.   For the change to Cymbalta, we need to transition you off Zoloft and into a dose you can tolerate of the Cymbalta, with a goal of 60mg  each day.   -decrease Zoloft to 1/2 pill for 1 week, then stop it.   -start Cymbalta at 30mg  each day.  Continue this dose for at least 2-3 weeks, and then can increase to 2 each day (60mg  dose) if you are tolerating 30mg .   -call me in next month and I can refill medication at dose you are tolerating, with plan on follow up in next 3 months.   -let me know if there are questions in the meantime.    Lester, MD.  [address], [place], [place], [place] [address]  Main: [phone]
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
## 2015-07-17 LAB — MICROALBUMIN, URINE: Microalb, Ur: 8.1 mg/dL

## 2015-07-23 ENCOUNTER — Other Ambulatory Visit: Payer: Self-pay | Admitting: Family Medicine

## 2015-07-24 ENCOUNTER — Other Ambulatory Visit: Payer: Self-pay | Admitting: Family Medicine

## 2015-07-29 NOTE — Telephone Encounter (Signed)
Dr [name] patient has booked a follow up appointment with you 1/30 can we refill his trazodone until then?

## 2015-07-30 NOTE — Telephone Encounter (Signed)
Yes.  Refill sent in.

## 2015-08-03 DIAGNOSIS — E1165 Type 2 diabetes mellitus with hyperglycemia: Secondary | ICD-10-CM | POA: Diagnosis not present

## 2015-08-03 DIAGNOSIS — E785 Hyperlipidemia, unspecified: Secondary | ICD-10-CM | POA: Diagnosis not present

## 2015-08-03 DIAGNOSIS — E1142 Type 2 diabetes mellitus with diabetic polyneuropathy: Secondary | ICD-10-CM | POA: Diagnosis not present

## 2015-08-03 DIAGNOSIS — Z794 Long term (current) use of insulin: Secondary | ICD-10-CM | POA: Diagnosis not present

## 2015-08-03 DIAGNOSIS — Z6838 Body mass index (BMI) 38.0-38.9, adult: Secondary | ICD-10-CM | POA: Diagnosis not present

## 2015-08-03 DIAGNOSIS — I10 Essential (primary) hypertension: Secondary | ICD-10-CM | POA: Diagnosis not present

## 2015-08-03 DIAGNOSIS — E669 Obesity, unspecified: Secondary | ICD-10-CM | POA: Diagnosis not present

## 2015-08-09 ENCOUNTER — Other Ambulatory Visit: Payer: Self-pay | Admitting: Physician Assistant

## 2015-08-20 ENCOUNTER — Other Ambulatory Visit: Payer: Self-pay | Admitting: Family Medicine

## 2015-08-30 ENCOUNTER — Encounter: Payer: Self-pay | Admitting: Pain Medicine

## 2015-08-30 ENCOUNTER — Ambulatory Visit: Payer: Medicare Other | Attending: Pain Medicine | Admitting: Pain Medicine

## 2015-08-30 VITALS — BP 152/57 | HR 90 | Temp 98.2°F | Resp 16 | Ht 72.0 in | Wt 280.0 lb

## 2015-08-30 DIAGNOSIS — M5124 Other intervertebral disc displacement, thoracic region: Secondary | ICD-10-CM | POA: Diagnosis not present

## 2015-08-30 DIAGNOSIS — R52 Pain, unspecified: Secondary | ICD-10-CM

## 2015-08-30 DIAGNOSIS — M5126 Other intervertebral disc displacement, lumbar region: Secondary | ICD-10-CM | POA: Diagnosis not present

## 2015-08-30 DIAGNOSIS — M79604 Pain in right leg: Secondary | ICD-10-CM | POA: Insufficient documentation

## 2015-08-30 DIAGNOSIS — M5416 Radiculopathy, lumbar region: Secondary | ICD-10-CM | POA: Diagnosis not present

## 2015-08-30 DIAGNOSIS — Z89511 Acquired absence of right leg below knee: Secondary | ICD-10-CM

## 2015-08-30 DIAGNOSIS — Z89519 Acquired absence of unspecified leg below knee: Secondary | ICD-10-CM | POA: Insufficient documentation

## 2015-08-30 DIAGNOSIS — M25511 Pain in right shoulder: Secondary | ICD-10-CM | POA: Diagnosis present

## 2015-08-30 DIAGNOSIS — G546 Phantom limb syndrome with pain: Secondary | ICD-10-CM | POA: Insufficient documentation

## 2015-08-30 DIAGNOSIS — M4806 Spinal stenosis, lumbar region: Secondary | ICD-10-CM | POA: Insufficient documentation

## 2015-08-30 DIAGNOSIS — M533 Sacrococcygeal disorders, not elsewhere classified: Secondary | ICD-10-CM | POA: Insufficient documentation

## 2015-08-30 DIAGNOSIS — K219 Gastro-esophageal reflux disease without esophagitis: Secondary | ICD-10-CM | POA: Diagnosis not present

## 2015-08-30 DIAGNOSIS — M5134 Other intervertebral disc degeneration, thoracic region: Secondary | ICD-10-CM | POA: Diagnosis not present

## 2015-08-30 DIAGNOSIS — G588 Other specified mononeuropathies: Secondary | ICD-10-CM | POA: Diagnosis not present

## 2015-08-30 DIAGNOSIS — G9520 Unspecified cord compression: Secondary | ICD-10-CM | POA: Insufficient documentation

## 2015-08-30 DIAGNOSIS — M47817 Spondylosis without myelopathy or radiculopathy, lumbosacral region: Secondary | ICD-10-CM | POA: Diagnosis not present

## 2015-08-30 DIAGNOSIS — E119 Type 2 diabetes mellitus without complications: Secondary | ICD-10-CM | POA: Insufficient documentation

## 2015-08-30 DIAGNOSIS — F419 Anxiety disorder, unspecified: Secondary | ICD-10-CM | POA: Insufficient documentation

## 2015-08-30 DIAGNOSIS — F329 Major depressive disorder, single episode, unspecified: Secondary | ICD-10-CM | POA: Diagnosis not present

## 2015-08-30 DIAGNOSIS — M51369 Other intervertebral disc degeneration, lumbar region without mention of lumbar back pain or lower extremity pain: Secondary | ICD-10-CM | POA: Insufficient documentation

## 2015-08-30 DIAGNOSIS — N529 Male erectile dysfunction, unspecified: Secondary | ICD-10-CM | POA: Insufficient documentation

## 2015-08-30 DIAGNOSIS — Z981 Arthrodesis status: Secondary | ICD-10-CM | POA: Insufficient documentation

## 2015-08-30 DIAGNOSIS — M2578 Osteophyte, vertebrae: Secondary | ICD-10-CM | POA: Insufficient documentation

## 2015-08-30 DIAGNOSIS — M5136 Other intervertebral disc degeneration, lumbar region: Secondary | ICD-10-CM | POA: Insufficient documentation

## 2015-08-30 DIAGNOSIS — M19011 Primary osteoarthritis, right shoulder: Secondary | ICD-10-CM

## 2015-08-30 DIAGNOSIS — M19019 Primary osteoarthritis, unspecified shoulder: Secondary | ICD-10-CM | POA: Insufficient documentation

## 2015-08-30 NOTE — Progress Notes (Signed)
Safety precautions to be maintained throughout the outpatient stay will include: orient to surroundings, keep bed in low position, maintain call bell within reach at all times, provide assistance with transfer out of bed and ambulation.  

## 2015-08-30 NOTE — Patient Instructions (Addendum)
Pain Management Discharge Instructions  General Discharge Instructions :  If you need to reach your doctor call: Monday-Friday 8:00 am - 4:00 pm at [phone] or toll free [phone].  After clinic hours [phone] to have operator reach doctor.  Bring all of your medication bottles to all your appointments in the pain clinic.  To cancel or reschedule your appointment with Pain Management please remember to call 24 hours in advance to avoid a fee.  Refer to the educational materials which you have been given on: General Risks, I had my Procedure. Discharge Instructions, Post Sedation.  Post Procedure Instructions:  The drugs you were given will stay in your system until tomorrow, so for the next 24 hours you should not drive, make any legal decisions or drink any alcoholic beverages.  You may eat anything you prefer, but it is better to start with liquids then soups and crackers, and gradually work up to solid foods.  Please notify your doctor immediately if you have any unusual bleeding, trouble breathing or pain that is not related to your normal pain.  Depending on the type of procedure that was done, some parts of your body may feel week and/or numb.  This usually clears up by tonight or the next day.  Walk with the use of an assistive device or accompanied by an adult for the 24 hours.  You may use ice on the affected area for the first 24 hours.  Put ice in a Ziploc bag and cover with a towel and place against area 15 minutes on 15 minutes off.  You may switch to heat after 24 hours.GENERAL RISKS AND COMPLICATIONS  What are the risk, side effects and possible complications? Generally speaking, most procedures are safe.  However, with any procedure there are risks, side effects, and the possibility of complications.  The risks and complications are dependent upon the sites that are lesioned, or the type of nerve block to be performed.  The closer the procedure is to the spine,  the more serious the risks are.  Great care is taken when placing the radio frequency needles, block needles or lesioning probes, but sometimes complications can occur. 1. Infection: Any time there is an injection through the skin, there is a risk of infection.  This is why sterile conditions are used for these blocks.  There are four possible types of infection. 1. Localized skin infection. 2. Central Nervous System Infection-This can be in the form of Meningitis, which can be deadly. 3. Epidural Infections-This can be in the form of an epidural abscess, which can cause pressure inside of the spine, causing compression of the spinal cord with subsequent paralysis. This would require an emergency surgery to decompress, and there are no guarantees that the patient would recover from the paralysis. 4. Discitis-This is an infection of the intervertebral discs.  It occurs in about 1% of discography procedures.  It is difficult to treat and it may lead to surgery.        2. Pain: the needles have to go through skin and soft tissues, will cause soreness.       3. Damage to internal structures:  The nerves to be lesioned may be near blood vessels or    other nerves which can be potentially damaged.       4. Bleeding: Bleeding is more common if the patient is taking blood thinners such as  aspirin, Coumadin, Ticiid, Plavix, etc., or if he/she have some genetic predisposition  such as   hemophilia. Bleeding into the spinal canal can cause compression of the spinal  cord with subsequent paralysis.  This would require an emergency surgery to  decompress and there are no guarantees that the patient would recover from the  paralysis.       5. Pneumothorax:  Puncturing of a lung is a possibility, every time a needle is introduced in  the area of the chest or upper back.  Pneumothorax refers to free air around the  collapsed lung(s), inside of the thoracic cavity (chest cavity).  Another two possible  complications  related to a similar event would include: Hemothorax and Chylothorax.   These are variations of the Pneumothorax, where instead of air around the collapsed  lung(s), you may have blood or chyle, respectively.       6. Spinal headaches: They may occur with any procedures in the area of the spine.       7. Persistent CSF (Cerebro-Spinal Fluid) leakage: This is a rare problem, but may occur  with prolonged intrathecal or epidural catheters either due to the formation of a fistulous  track or a dural tear.       8. Nerve damage: By working so close to the spinal cord, there is always a possibility of  nerve damage, which could be as serious as a permanent spinal cord injury with  paralysis.       9. Death:  Although rare, severe deadly allergic reactions known as "Anaphylactic  reaction" can occur to any of the medications used.      10. Worsening of the symptoms:  We can always make thing worse.  What are the chances of something like this happening? Chances of any of this occuring are extremely low.  By statistics, you have more of a chance of getting killed in a motor vehicle accident: while driving to the hospital than any of the above occurring .  Nevertheless, you should be aware that they are possibilities.  In general, it is similar to taking a shower.  Everybody knows that you can slip, hit your head and get killed.  Does that mean that you should not shower again?  Nevertheless always keep in mind that statistics do not mean anything if you happen to be on the wrong side of them.  Even if a procedure has a 1 (one) in a 1,000,000 (million) chance of going wrong, it you happen to be that one..Also, keep in mind that by statistics, you have more of a chance of having something go wrong when taking medications.  Who should not have this procedure? If you are on a blood thinning medication (e.g. Coumadin, Plavix, see list of "Blood Thinners"), or if you have an active infection going on, you should not  have the procedure.  If you are taking any blood thinners, please inform your physician.  How should I prepare for this procedure?  Do not eat or drink anything at least six hours prior to the procedure.  Bring a driver with you .  It cannot be a taxi.  Come accompanied by an adult that can drive you back, and that is strong enough to help you if your legs get weak or numb from the local anesthetic.  Take all of your medicines the morning of the procedure with just enough water to swallow them.  If you have diabetes, make sure that you are scheduled to have your procedure done first thing in the morning, whenever possible.  If you have diabetes,   take only half of your insulin dose and notify our nurse that you have done so as soon as you arrive at the clinic.  If you are diabetic, but only take blood sugar pills (oral hypoglycemic), then do not take them on the morning of your procedure.  You may take them after you have had the procedure.  Do not take aspirin or any aspirin-containing medications, at least eleven (11) days prior to the procedure.  They may prolong bleeding.  Wear loose fitting clothing that may be easy to take off and that you would not mind if it got stained with Betadine or blood.  Do not wear any jewelry or perfume  Remove any nail coloring.  It will interfere with some of our monitoring equipment.  NOTE: Remember that this is not meant to be interpreted as a complete list of all possible complications.  Unforeseen problems may occur.  BLOOD THINNERS The following drugs contain aspirin or other products, which can cause increased bleeding during surgery and should not be taken for 2 weeks prior to and 1 week after surgery.  If you should need take something for relief of minor pain, you may take acetaminophen which is found in Tylenol,m Datril, Anacin-3 and Panadol. It is not blood thinner. The products listed below are.  Do not take any of the products listed below  in addition to any listed on your instruction sheet.  A.P.C or A.P.C with Codeine Codeine Phosphate Capsules #3 Ibuprofen Ridaura  ABC compound Congesprin Imuran rimadil  Advil Cope Indocin Robaxisal  Alka-Seltzer Effervescent Pain Reliever and Antacid Coricidin or Coricidin-D  Indomethacin Rufen  Alka-Seltzer plus Cold Medicine Cosprin Ketoprofen S-A-C Tablets  Anacin Analgesic Tablets or Capsules Coumadin Korlgesic Salflex  Anacin Extra Strength Analgesic tablets or capsules CP-2 Tablets Lanoril Salicylate  Anaprox Cuprimine Capsules Levenox Salocol  Anexsia-D Dalteparin Magan Salsalate  Anodynos Darvon compound Magnesium Salicylate Sine-off  Ansaid Dasin Capsules Magsal Sodium Salicylate  Anturane Depen Capsules Marnal Soma  APF Arthritis pain formula Dewitt's Pills Measurin Stanback  Argesic Dia-Gesic Meclofenamic Sulfinpyrazone  Arthritis Bayer Timed Release Aspirin Diclofenac Meclomen Sulindac  Arthritis pain formula Anacin Dicumarol Medipren Supac  Analgesic (Safety coated) Arthralgen Diffunasal Mefanamic Suprofen  Arthritis Strength Bufferin Dihydrocodeine Mepro Compound Suprol  Arthropan liquid Dopirydamole Methcarbomol with Aspirin Synalgos  ASA tablets/Enseals Disalcid Micrainin Tagament  Ascriptin Doan's Midol Talwin  Ascriptin A/D Dolene Mobidin Tanderil  Ascriptin Extra Strength Dolobid Moblgesic Ticlid  Ascriptin with Codeine Doloprin or Doloprin with Codeine Momentum Tolectin  Asperbuf Duoprin Mono-gesic Trendar  Aspergum Duradyne Motrin or Motrin IB Triminicin  Aspirin plain, buffered or enteric coated Durasal Myochrisine Trigesic  Aspirin Suppositories Easprin Nalfon Trillsate  Aspirin with Codeine Ecotrin Regular or Extra Strength Naprosyn Uracel  Atromid-S Efficin Naproxen Ursinus  Auranofin Capsules Elmiron Neocylate Vanquish  Axotal Emagrin Norgesic Verin  Azathioprine Empirin or Empirin with Codeine Normiflo Vitamin E  Azolid Emprazil Nuprin Voltaren  Bayer  Aspirin plain, buffered or children's or timed BC Tablets or powders Encaprin Orgaran Warfarin Sodium  Buff-a-Comp Enoxaparin Orudis Zorpin  Buff-a-Comp with Codeine Equegesic Os-Cal-Gesic   Buffaprin Excedrin plain, buffered or Extra Strength Oxalid   Bufferin Arthritis Strength Feldene Oxphenbutazone   Bufferin plain or Extra Strength Feldene Capsules Oxycodone with Aspirin   Bufferin with Codeine Fenoprofen Fenoprofen Pabalate or Pabalate-SF   Buffets II Flogesic Panagesic   Buffinol plain or Extra Strength Florinal or Florinal with Codeine Panwarfarin   Buf-Tabs Flurbiprofen Penicillamine   Butalbital Compound Four-way cold tablets   Penicillin   Butazolidin Fragmin Pepto-Bismol   Carbenicillin Geminisyn Percodan   Carna Arthritis Reliever Geopen Persantine   Carprofen Gold's salt Persistin   Chloramphenicol Goody's Phenylbutazone   Chloromycetin Haltrain Piroxlcam   Clmetidine heparin Plaquenil   Cllnoril Hyco-pap Ponstel   Clofibrate Hydroxy chloroquine Propoxyphen         Before stopping any of these medications, be sure to consult the physician who ordered them.  Some, such as Coumadin (Warfarin) are ordered to prevent or treat serious conditions such as "deep thrombosis", "pumonary embolisms", and other heart problems.  The amount of time that you may need off of the medication may also vary with the medication and the reason for which you were taking it.  If you are taking any of these medications, please make sure you notify your pain physician before you undergo any procedures.         Facet Joint Block The facet joints connect the bones of the spine (vertebrae). They make it possible for you to bend, twist, and make other movements with your spine. They also prevent you from overbending, overtwisting, and making other excessive movements.  A facet joint block is a procedure where a numbing medicine (anesthetic) is injected into a facet joint. Often, a type of  anti-inflammatory medicine called a steroid is also injected. A facet joint block may be done for two reasons:  2. Diagnosis. A facet joint block may be done as a test to see whether neck or back pain is caused by a worn-down or infected facet joint. If the pain gets better after a facet joint block, it means the pain is probably coming from the facet joint. If the pain does not get better, it means the pain is probably not coming from the facet joint.  3. Therapy. A facet joint block may be done to relieve neck or back pain caused by a facet joint. A facet joint block is only done as a therapy if the pain does not improve with medicine, exercise programs, physical therapy, and other forms of pain management. LET YOUR HEALTH CARE PROVIDER KNOW ABOUT:   Any allergies you have.   All medicines you are taking, including vitamins, herbs, eyedrops, and over-the-counter medicines and creams.   Previous problems you or members of your family have had with the use of anesthetics.   Any blood disorders you have had.   Other health problems you have. RISKS AND COMPLICATIONS Generally, having a facet joint block is safe. However, as with any procedure, complications can occur. Possible complications associated with having a facet joint block include:   Bleeding.   Injury to a nerve near the injection site.   Pain at the injection site.   Weakness or numbness in areas controlled by nerves near the injection site.   Infection.   Temporary fluid retention.   Allergic reaction to anesthetics or medicines used during the procedure. BEFORE THE PROCEDURE   Follow your health care provider's instructions if you are taking dietary supplements or medicines. You may need to stop taking them or reduce your dosage.   Do not take any new dietary supplements or medicines without asking your health care provider first.   Follow your health care provider's instructions about eating and drinking  before the procedure. You may need to stop eating and drinking several hours before the procedure.   Arrange to have an adult drive you home after the procedure. PROCEDURE 12. You may need to remove your clothing and   dress in an open-back gown so that your health care provider can access your spine.  13. The procedure will be done while you are lying on an X-ray table. Most of the time you will be asked to lie on your stomach, but you may be asked to lie in a different position if an injection will be made in your neck.  14. Special machines will be used to monitor your oxygen levels, heart rate, and blood pressure.  15. If an injection will be made in your neck, an intravenous (IV) tube will be inserted into one of your veins. Fluids and medicine will flow directly into your body through the IV tube.  16. The area over the facet joint where the injection will be made will be cleaned with an antiseptic soap. The surrounding skin will be covered with sterile drapes.  17. An anesthetic will be applied to your skin to make the injection area numb. You may feel a temporary stinging or burning sensation.  18. A video X-ray machine will be used to locate the joint. A contrast dye may be injected into the facet joint area to help with locating the joint.  19. When the joint is located, an anesthetic medicine will be injected into the joint through the needle.  20. Your health care provider will ask you whether you feel pain relief. If you do feel relief, a steroid may be injected to provide pain relief for a longer period of time. If you do not feel relief or feel only partial relief, additional injections of an anesthetic may be made in other facet joints.  21. The needle will be removed, the skin will be cleansed, and bandages will be applied.  AFTER THE PROCEDURE   You will be observed for 15-30 minutes before being allowed to go home. Do not drive. Have an adult drive you or take a taxi or  public transportation instead.   If you feel pain relief, the pain will return in several hours or days when the anesthetic wears off.   You may feel pain relief 2-14 days after the procedure. The amount of time this relief lasts varies from person to person.   It is normal to feel some tenderness over the injected area(s) for 2 days following the procedure.   If you have diabetes, you may have a temporary increase in blood sugar.   This information is not intended to replace advice given to you by your health care provider. Make sure you discuss any questions you have with your health care provider.   Document Released: 01/21/2007 Document Revised: 09/22/2014 Document Reviewed: 06/21/2012 Elsevier Interactive Patient Education 2016 [employer]  
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
## 2015-09-13 ENCOUNTER — Other Ambulatory Visit: Payer: Self-pay | Admitting: Family Medicine

## 2015-09-17 ENCOUNTER — Other Ambulatory Visit: Payer: Self-pay | Admitting: Physician Assistant

## 2015-09-18 ENCOUNTER — Other Ambulatory Visit: Payer: Self-pay | Admitting: Pain Medicine

## 2015-09-19 ENCOUNTER — Other Ambulatory Visit: Payer: Self-pay | Admitting: Family Medicine

## 2015-09-21 NOTE — Telephone Encounter (Signed)
Refilled

## 2015-09-21 NOTE — Telephone Encounter (Signed)
Called in.

## 2015-09-24 ENCOUNTER — Encounter: Payer: Self-pay | Admitting: Pain Medicine

## 2015-09-24 ENCOUNTER — Ambulatory Visit: Payer: Medicare Other | Admitting: Pain Medicine

## 2015-09-24 ENCOUNTER — Ambulatory Visit: Payer: Medicare Other | Attending: Pain Medicine | Admitting: Pain Medicine

## 2015-09-24 VITALS — BP 143/78 | HR 82 | Temp 98.4°F | Resp 18 | Ht 72.0 in | Wt 280.0 lb

## 2015-09-24 DIAGNOSIS — M4806 Spinal stenosis, lumbar region: Secondary | ICD-10-CM | POA: Diagnosis not present

## 2015-09-24 DIAGNOSIS — Z89511 Acquired absence of right leg below knee: Secondary | ICD-10-CM

## 2015-09-24 DIAGNOSIS — M47817 Spondylosis without myelopathy or radiculopathy, lumbosacral region: Secondary | ICD-10-CM | POA: Diagnosis not present

## 2015-09-24 DIAGNOSIS — M5126 Other intervertebral disc displacement, lumbar region: Secondary | ICD-10-CM | POA: Diagnosis not present

## 2015-09-24 DIAGNOSIS — M5136 Other intervertebral disc degeneration, lumbar region: Secondary | ICD-10-CM | POA: Insufficient documentation

## 2015-09-24 DIAGNOSIS — M19011 Primary osteoarthritis, right shoulder: Secondary | ICD-10-CM

## 2015-09-24 DIAGNOSIS — G546 Phantom limb syndrome with pain: Secondary | ICD-10-CM

## 2015-09-24 DIAGNOSIS — M545 Low back pain: Secondary | ICD-10-CM | POA: Insufficient documentation

## 2015-09-24 DIAGNOSIS — M5134 Other intervertebral disc degeneration, thoracic region: Secondary | ICD-10-CM

## 2015-09-24 DIAGNOSIS — M79606 Pain in leg, unspecified: Secondary | ICD-10-CM | POA: Diagnosis present

## 2015-09-24 MED ORDER — MIDAZOLAM HCL 5 MG/5ML IJ SOLN
5.0000 mg | Freq: Once | INTRAMUSCULAR | Status: AC
  Administered 2015-09-24: 5 mg via INTRAVENOUS

## 2015-09-24 MED ORDER — FENTANYL CITRATE (PF) 100 MCG/2ML IJ SOLN
INTRAMUSCULAR | Status: AC
  Administered 2015-09-24: 100 ug via INTRAVENOUS
  Filled 2015-09-24: qty 2

## 2015-09-24 MED ORDER — FENTANYL CITRATE (PF) 100 MCG/2ML IJ SOLN
100.0000 ug | Freq: Once | INTRAMUSCULAR | Status: AC
  Administered 2015-09-24: 100 ug via INTRAVENOUS

## 2015-09-24 MED ORDER — LACTATED RINGERS IV SOLN: 1000.0000 mL | INTRAVENOUS | Status: DC

## 2015-09-24 MED ORDER — ORPHENADRINE CITRATE 30 MG/ML IJ SOLN
INTRAMUSCULAR | Status: AC
  Administered 2015-09-24: 60 mg via INTRAMUSCULAR
  Filled 2015-09-24: qty 2

## 2015-09-24 MED ORDER — CIPROFLOXACIN HCL 250 MG PO TABS: 250.0000 mg | ORAL_TABLET | Freq: Two times a day (BID) | ORAL | Status: DC

## 2015-09-24 MED ORDER — MIDAZOLAM HCL 5 MG/5ML IJ SOLN
INTRAMUSCULAR | Status: AC
  Administered 2015-09-24: 5 mg via INTRAVENOUS
  Filled 2015-09-24: qty 5

## 2015-09-24 MED ORDER — BUPIVACAINE HCL (PF) 0.25 % IJ SOLN
30.0000 mL | Freq: Once | INTRAMUSCULAR | Status: AC
Start: 2015-09-24 — End: 2015-09-24
  Administered 2015-09-24: 11:00:00

## 2015-09-24 MED ORDER — TRIAMCINOLONE ACETONIDE 40 MG/ML IJ SUSP
INTRAMUSCULAR | Status: AC
  Administered 2015-09-24: 40 mg
  Filled 2015-09-24: qty 1

## 2015-09-24 MED ORDER — TRIAMCINOLONE ACETONIDE 40 MG/ML IJ SUSP
40.0000 mg | Freq: Once | INTRAMUSCULAR | Status: AC
  Administered 2015-09-24: 40 mg

## 2015-09-24 MED ORDER — ORPHENADRINE CITRATE 30 MG/ML IJ SOLN
60.0000 mg | Freq: Once | INTRAMUSCULAR | Status: AC
  Administered 2015-09-24: 60 mg via INTRAMUSCULAR

## 2015-09-24 MED ORDER — BUPIVACAINE HCL (PF) 0.25 % IJ SOLN
INTRAMUSCULAR | Status: AC
  Filled 2015-09-24: qty 30

## 2015-09-24 MED ORDER — CIPROFLOXACIN IN D5W 400 MG/200ML IV SOLN
INTRAVENOUS | Status: AC
  Administered 2015-09-24: 2 mg via INTRAVENOUS
  Filled 2015-09-24: qty 200

## 2015-09-24 MED ORDER — CIPROFLOXACIN IN D5W 400 MG/200ML IV SOLN
400.0000 mg | Freq: Once | INTRAVENOUS | Status: AC
  Administered 2015-09-24: 2 mg via INTRAVENOUS

## 2015-09-24 NOTE — Progress Notes (Signed)
Safety precautions to be maintained throughout the outpatient stay will include: orient to surroundings, keep bed in low position, maintain call bell within reach at all times, provide assistance with transfer out of bed and ambulation.  

## 2015-09-24 NOTE — Progress Notes (Signed)
Subjective:    Patient ID: [name], male    DOB: Jul 09, 1951, 65 y.o.   MRN: [id number]  HPI  PROCEDURE PERFORMED: Lumbar facet (medial branch block)   NOTE: The patient is a 65 y.o. male who returns to [facility] for further evaluation and treatment of pain involving the lumbar and lower extremity region. 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 Due to is protrusion, short pedicles and facet disease. Mild foraminal encroachment bilaterally. L4-L5 annular rent associated focal central disc protrusion. Diffuse disc bulging facet hypertrophy moderately severe spinal and lateral recess stenosis with foraminal stenosis bilaterally. L5-S1 broad-based disc protrusion with short pedicles and facet disease with moderately severe spinal stenosis and lateral recess stenosis with bilateral foraminal encroachment There is concern regarding significant component of patient's pain began due to facet syndrome. The risks, benefits, and expectations of the procedure have been discussed and explained to the patient who was understanding and in agreement with suggested treatment plan. We will proceed with interventional treatment as discussed and as explained to the patient who was understanding and wished to proceed with procedure as planned.   DESCRIPTION OF PROCEDURE: Lumbar facet (medial branch block) with IV Versed, IV fentanyl conscious sedation, EKG, blood pressure, pulse, and pulse oximetry monitoring. The procedure was performed with the patient in the prone position. Betadine prep of proposed entry site performed.   NEEDLE PLACEMENT AT: Left L 2 lumbar facet (medial branch block). Under fluoroscopic guidance with oblique orientation of 15 degrees, a 22-gauge needle was inserted at the L 2 vertebral body level with needle placed at the targeted area of  Burton's Eye or Eye of the Scotty Dog with documentation of needle placement in the superior and lateral border of targeted area of Burton's Eye or Eye of the Scotty Dog with oblique orientation of 15 degrees. Following documentation of needle placement at the L 2 vertebral body level, needle placement was then accomplished at the L 3 vertebral body level.   NEEDLE PLACEMENT AT L3, L4, and L5 VERTEBRAL BODY LEVELS ON THE LEFT SIDE The procedure was performed at the L3, L4, and L5 vertebral body levels exactly as was performed at the L 2 vertebral body level utilizing the same technique and under fluoroscopic guidance.  NEEDLE PLACEMENT AT THE SACRAL ALA with AP view of the lumbosacral spine. With the patient in the prone position, Betadine prep of proposed entry site accomplished, a 22 gauge needle was inserted in the region of the sacral ala (groove formed by the superior articulating process of S1 and the sacral wing). Following documentation of needle placement at the sacral ala,  needle placement was then accomplished at the S1 foramen level.    Needle placement was then verified at all levels on lateral view. Following documentation of needle placement at all levels on lateral view and following negative aspiration for heme and CSF, each level was injected with 1 mL of 0.25% bupivacaine with Kenalog.     LUMBAR FACET, MEDIAL BRANCH NERVE, BLOCKS PERFORMED ON THE RIGHT SIDE   The procedure was performed on the right side exactly as was performed on the left side at the same levels and utilizing the same technique under fluoroscopic guidance.     The patient tolerated the procedure well. A total of 40 mg of Kenalog was utilized for the procedure.   PLAN:  1.  Medications: The patient will continue presently prescribed medications. 2. May consider modification of treatment regimen at time of return appointment pending response to treatment rendered on today's visit. 3. The patient is to  follow-up with primary care physician Dr. Carlota Raspberry for further evaluation of blood pressure and general medical condition status post steroid injection performed on today's visit. 4. Surgical follow-up evaluation. Has been addressed 5. Neurological follow-up evaluation. Has been addressed 6. The patient may be candidate for radiofrequency procedures, implantation type procedures, and other treatment pending response to treatment and follow-up evaluation. 7. The patient has been advised to call the Pain Management Center prior to scheduled return appointment should there be significant change in condition or should patient have other concerns regarding condition prior to scheduled return appointment.  The patient is understanding and in agreement with suggested treatment plan.   Review of Systems     Objective:   Physical Exam        Assessment & Plan:

## 2015-09-24 NOTE — Patient Instructions (Addendum)
Continue present medication and begin taking antibiotic Cipro as prescribed. Please obtain your antibiotic Cipro today and begin taking antibiotic today  F/U PCP Dr. Carlota Raspberry for evaliation of  BP and general medical  condition.  F/U surgical evaluation. May consider pending follow-up evaluations  F/U neurological evaluation. May consider pending follow-up evaluations  May consider radiofrequency rhizolysis or intraspinal procedures pending response to present treatment and F/U evaluation.  Patient to call Pain Management Center should patient have concerns prior to scheduled return appointment.  Pain Management Discharge Instructions  General Discharge Instructions :  If you need to reach your doctor call: Monday-Friday 8:00 am - 4:00 pm at [phone] or toll free [phone].  After clinic hours [phone] to have operator reach doctor.  Bring all of your medication bottles to all your appointments in the pain clinic.  To cancel or reschedule your appointment with Pain Management please remember to call 24 hours in advance to avoid a fee.  Refer to the educational materials which you have been given on: General Risks, I had my Procedure. Discharge Instructions, Post Sedation.  Post Procedure Instructions:  The drugs you were given will stay in your system until tomorrow, so for the next 24 hours you should not drive, make any legal decisions or drink any alcoholic beverages.  You may eat anything you prefer, but it is better to start with liquids then soups and crackers, and gradually work up to solid foods.  Please notify your doctor immediately if you have any unusual bleeding, trouble breathing or pain that is not related to your normal pain.  Depending on the type of procedure that was done, some parts of your body may feel week and/or numb.  This usually clears up by tonight or the next day.  Walk with the use of an assistive device or accompanied by an adult for the 24  hours.  You may use ice on the affected area for the first 24 hours.  Put ice in a Ziploc bag and cover with a towel and place against area 15 minutes on 15 minutes off.  You may switch to heat after 24 hours.Facet Joint Block The facet joints connect the bones of the spine (vertebrae). They make it possible for you to bend, twist, and make other movements with your spine. They also prevent you from overbending, overtwisting, and making other excessive movements.  A facet joint block is a procedure where a numbing medicine (anesthetic) is injected into a facet joint. Often, a type of anti-inflammatory medicine called a steroid is also injected. A facet joint block may be done for two reasons:   Diagnosis. A facet joint block may be done as a test to see whether neck or back pain is caused by a worn-down or infected facet joint. If the pain gets better after a facet joint block, it means the pain is probably coming from the facet joint. If the pain does not get better, it means the pain is probably not coming from the facet joint.   Therapy. A facet joint block may be done to relieve neck or back pain caused by a facet joint. A facet joint block is only done as a therapy if the pain does not improve with medicine, exercise programs, physical therapy, and other forms of pain management. LET [facility] CARE PROVIDER KNOW ABOUT:   Any allergies you have.   All medicines you are taking, including vitamins, herbs, eyedrops, and over-the-counter medicines and creams.   Previous problems you  or members of your family have had with the use of anesthetics.   Any blood disorders you have had.   Other health problems you have. RISKS AND COMPLICATIONS Generally, having a facet joint block is safe. However, as with any procedure, complications can occur. Possible complications associated with having a facet joint block include:   Bleeding.   Injury to a nerve near the injection site.   Pain at  the injection site.   Weakness or numbness in areas controlled by nerves near the injection site.   Infection.   Temporary fluid retention.   Allergic reaction to anesthetics or medicines used during the procedure. BEFORE THE PROCEDURE   Follow your health care provider's instructions if you are taking dietary supplements or medicines. You may need to stop taking them or reduce your dosage.   Do not take any new dietary supplements or medicines without asking your health care provider first.   Follow your health care provider's instructions about eating and drinking before the procedure. You may need to stop eating and drinking several hours before the procedure.   Arrange to have an adult drive you home after the procedure. PROCEDURE  You may need to remove your clothing and dress in an open-back gown so that your health care provider can access your spine.   The procedure will be done while you are lying on an X-ray table. Most of the time you will be asked to lie on your stomach, but you may be asked to lie in a different position if an injection will be made in your neck.   Special machines will be used to monitor your oxygen levels, heart rate, and blood pressure.   If an injection will be made in your neck, an intravenous (IV) tube will be inserted into one of your veins. Fluids and medicine will flow directly into your body through the IV tube.   The area over the facet joint where the injection will be made will be cleaned with an antiseptic soap. The surrounding skin will be covered with sterile drapes.   An anesthetic will be applied to your skin to make the injection area numb. You may feel a temporary stinging or burning sensation.   A video X-ray machine will be used to locate the joint. A contrast dye may be injected into the facet joint area to help with locating the joint.   When the joint is located, an anesthetic medicine will be injected into the  joint through the needle.   Your health care provider will ask you whether you feel pain relief. If you do feel relief, a steroid may be injected to provide pain relief for a longer period of time. If you do not feel relief or feel only partial relief, additional injections of an anesthetic may be made in other facet joints.   The needle will be removed, the skin will be cleansed, and bandages will be applied.  AFTER THE PROCEDURE   You will be observed for 15-30 minutes before being allowed to go home. Do not drive. Have an adult drive you or take a taxi or public transportation instead.   If you feel pain relief, the pain will return in several hours or days when the anesthetic wears off.   You may feel pain relief 2-14 days after the procedure. The amount of time this relief lasts varies from person to person.   It is normal to feel some tenderness over the injected area(s) for 2  days following the procedure.   If you have diabetes, you may have a temporary increase in blood sugar.   This information is not intended to replace advice given to you by your health care provider. Make sure you discuss any questions you have with your health care provider.   Document Released: 01/21/2007 Document Revised: 09/22/2014 Document Reviewed: 06/21/2012 Elsevier Interactive Patient Education [employer].

## 2015-09-25 ENCOUNTER — Encounter: Payer: Self-pay | Admitting: Pain Medicine

## 2015-09-25 ENCOUNTER — Telehealth: Payer: Self-pay

## 2015-09-25 ENCOUNTER — Ambulatory Visit: Payer: Medicare Other | Attending: Pain Medicine | Admitting: Pain Medicine

## 2015-09-25 VITALS — BP 137/60 | HR 69 | Temp 98.2°F | Resp 16 | Ht 72.0 in | Wt 280.0 lb

## 2015-09-25 DIAGNOSIS — M5136 Other intervertebral disc degeneration, lumbar region: Secondary | ICD-10-CM

## 2015-09-25 DIAGNOSIS — M791 Myalgia: Secondary | ICD-10-CM | POA: Diagnosis not present

## 2015-09-25 DIAGNOSIS — G9520 Unspecified cord compression: Secondary | ICD-10-CM | POA: Diagnosis not present

## 2015-09-25 DIAGNOSIS — M19011 Primary osteoarthritis, right shoulder: Secondary | ICD-10-CM

## 2015-09-25 DIAGNOSIS — G588 Other specified mononeuropathies: Secondary | ICD-10-CM | POA: Insufficient documentation

## 2015-09-25 DIAGNOSIS — M5134 Other intervertebral disc degeneration, thoracic region: Secondary | ICD-10-CM | POA: Insufficient documentation

## 2015-09-25 DIAGNOSIS — M4806 Spinal stenosis, lumbar region: Secondary | ICD-10-CM | POA: Insufficient documentation

## 2015-09-25 DIAGNOSIS — M5126 Other intervertebral disc displacement, lumbar region: Secondary | ICD-10-CM | POA: Diagnosis not present

## 2015-09-25 DIAGNOSIS — G546 Phantom limb syndrome with pain: Secondary | ICD-10-CM

## 2015-09-25 DIAGNOSIS — M79609 Pain in unspecified limb: Secondary | ICD-10-CM | POA: Diagnosis not present

## 2015-09-25 DIAGNOSIS — Z89511 Acquired absence of right leg below knee: Secondary | ICD-10-CM | POA: Diagnosis not present

## 2015-09-25 DIAGNOSIS — M545 Low back pain: Secondary | ICD-10-CM | POA: Diagnosis present

## 2015-09-25 DIAGNOSIS — M47817 Spondylosis without myelopathy or radiculopathy, lumbosacral region: Secondary | ICD-10-CM | POA: Diagnosis not present

## 2015-09-25 DIAGNOSIS — M2578 Osteophyte, vertebrae: Secondary | ICD-10-CM | POA: Insufficient documentation

## 2015-09-25 DIAGNOSIS — M79606 Pain in leg, unspecified: Secondary | ICD-10-CM | POA: Diagnosis present

## 2015-09-25 DIAGNOSIS — M5124 Other intervertebral disc displacement, thoracic region: Secondary | ICD-10-CM | POA: Diagnosis not present

## 2015-09-25 DIAGNOSIS — M5416 Radiculopathy, lumbar region: Secondary | ICD-10-CM | POA: Diagnosis not present

## 2015-09-25 MED ORDER — OXYCODONE HCL 5 MG PO TABS: ORAL_TABLET | ORAL | Status: DC

## 2015-09-25 MED ORDER — OXYCODONE HCL ER 10 MG PO T12A: EXTENDED_RELEASE_TABLET | ORAL | Status: DC
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
## 2015-09-25 NOTE — Progress Notes (Signed)
Safety precautions to be maintained throughout the outpatient stay will include: orient to surroundings, keep bed in low position, maintain call bell within reach at all times, provide assistance with transfer out of bed and ambulation.   Patient here s/p procedure on yesterday and needing pain medicine.  Patient states that sugar reading was >500 last evening after procedure and has made him feel terrible.  States he is very sore today after procedure and positioning involved.  Patient is asking for pain medication.  States he no longer has tramadol and that did not work and desires something else stronger for pain.

## 2015-09-25 NOTE — Patient Instructions (Addendum)
Continue present medications OxyContin and oxycodone. Caution OxyContin and oxycodone can cause respiratory depression, excessive sedation, confusion, and other undesirable side effects. You stated that you have taken this medication for a very long time. Limit OxyContin to 1 per day for the first 3 days to evaluate the effect of the medication on you   F/U PCP Dr. Carlota Raspberry for evaliation of  BP and general medical  condition.  F/U surgical evaluation. May consider pending follow-up evaluations  F/U neurological evaluation. May consider pending follow-up evaluations  May consider radiofrequency rhizolysis or intraspinal procedures pending response to present treatment and F/U evaluation.  Patient to call Pain Management Center should patient have concerns prior to scheduled return appointment.

## 2015-09-25 NOTE — Telephone Encounter (Signed)
Procedure call back.Marland KitchenMarland KitchenWife states he is in a lot of pain and is on his way into the clinic now to see Dr Primus Bravo.

## 2015-10-01 ENCOUNTER — Telehealth: Payer: Self-pay | Admitting: *Deleted

## 2015-10-01 ENCOUNTER — Other Ambulatory Visit: Payer: Self-pay | Admitting: *Deleted

## 2015-10-01 NOTE — Telephone Encounter (Signed)
Called [facility] in [place]; his insurance will  Cover  Ms contin,fentanyl, opana and nycynta by [employer].

## 2015-10-09 ENCOUNTER — Encounter: Payer: Self-pay | Admitting: Pain Medicine

## 2015-10-09 ENCOUNTER — Ambulatory Visit: Payer: Medicare Other | Attending: Pain Medicine | Admitting: Pain Medicine

## 2015-10-09 VITALS — BP 159/54 | HR 67 | Temp 98.3°F | Resp 18 | Ht 72.0 in | Wt 280.0 lb

## 2015-10-09 DIAGNOSIS — M5134 Other intervertebral disc degeneration, thoracic region: Secondary | ICD-10-CM | POA: Diagnosis not present

## 2015-10-09 DIAGNOSIS — M5416 Radiculopathy, lumbar region: Secondary | ICD-10-CM | POA: Diagnosis not present

## 2015-10-09 DIAGNOSIS — M5136 Other intervertebral disc degeneration, lumbar region: Secondary | ICD-10-CM | POA: Diagnosis not present

## 2015-10-09 DIAGNOSIS — M791 Myalgia: Secondary | ICD-10-CM | POA: Diagnosis not present

## 2015-10-09 DIAGNOSIS — M48062 Spinal stenosis, lumbar region with neurogenic claudication: Secondary | ICD-10-CM

## 2015-10-09 DIAGNOSIS — M4806 Spinal stenosis, lumbar region: Secondary | ICD-10-CM | POA: Insufficient documentation

## 2015-10-09 DIAGNOSIS — M79609 Pain in unspecified limb: Secondary | ICD-10-CM | POA: Diagnosis not present

## 2015-10-09 DIAGNOSIS — M19011 Primary osteoarthritis, right shoulder: Secondary | ICD-10-CM

## 2015-10-09 DIAGNOSIS — M79606 Pain in leg, unspecified: Secondary | ICD-10-CM | POA: Diagnosis present

## 2015-10-09 DIAGNOSIS — M5126 Other intervertebral disc displacement, lumbar region: Secondary | ICD-10-CM | POA: Insufficient documentation

## 2015-10-09 DIAGNOSIS — G588 Other specified mononeuropathies: Secondary | ICD-10-CM | POA: Diagnosis not present

## 2015-10-09 DIAGNOSIS — G546 Phantom limb syndrome with pain: Secondary | ICD-10-CM

## 2015-10-09 DIAGNOSIS — M545 Low back pain: Secondary | ICD-10-CM | POA: Diagnosis present

## 2015-10-09 DIAGNOSIS — M47817 Spondylosis without myelopathy or radiculopathy, lumbosacral region: Secondary | ICD-10-CM | POA: Diagnosis not present

## 2015-10-09 DIAGNOSIS — M2578 Osteophyte, vertebrae: Secondary | ICD-10-CM | POA: Diagnosis not present

## 2015-10-09 DIAGNOSIS — Z89511 Acquired absence of right leg below knee: Secondary | ICD-10-CM

## 2015-10-09 MED ORDER — OXYCODONE HCL 5 MG PO TABS: ORAL_TABLET | ORAL | Status: DC

## 2015-10-09 MED ORDER — NYSTATIN 100000 UNIT/ML MT SUSP: 5.0000 mL | Freq: Four times a day (QID) | OROMUCOSAL | Status: DC
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
## 2015-10-09 NOTE — Progress Notes (Signed)
Safety precautions to be maintained throughout the outpatient stay will include: orient to surroundings, keep bed in low position, maintain call bell within reach at all times, provide assistance with transfer out of bed and ambulation.  Pt insurance will not pay for oxyo 10mg  and would like something different
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
## 2015-10-15 ENCOUNTER — Encounter: Payer: Self-pay | Admitting: Pain Medicine

## 2015-10-15 ENCOUNTER — Other Ambulatory Visit
Admission: RE | Admit: 2015-10-15 | Discharge: 2015-10-15 | Disposition: A | Discharge status: Home / Self Care | Payer: Medicare Other | Source: Ambulatory Visit | Attending: Pain Medicine | Admitting: Pain Medicine

## 2015-10-15 ENCOUNTER — Encounter: Payer: Medicare Other | Admitting: Family Medicine

## 2015-10-15 ENCOUNTER — Ambulatory Visit: Payer: Medicare Other | Admitting: Pain Medicine

## 2015-10-15 VITALS — BP 146/58 | HR 69 | Temp 98.5°F | Resp 16 | Ht 72.0 in | Wt 280.0 lb

## 2015-10-15 DIAGNOSIS — M5136 Other intervertebral disc degeneration, lumbar region: Secondary | ICD-10-CM | POA: Insufficient documentation

## 2015-10-15 DIAGNOSIS — Z7982 Long term (current) use of aspirin: Secondary | ICD-10-CM

## 2015-10-15 DIAGNOSIS — M791 Myalgia: Secondary | ICD-10-CM | POA: Diagnosis not present

## 2015-10-15 DIAGNOSIS — Z89511 Acquired absence of right leg below knee: Secondary | ICD-10-CM | POA: Insufficient documentation

## 2015-10-15 DIAGNOSIS — M5134 Other intervertebral disc degeneration, thoracic region: Secondary | ICD-10-CM | POA: Insufficient documentation

## 2015-10-15 DIAGNOSIS — M51369 Other intervertebral disc degeneration, lumbar region without mention of lumbar back pain or lower extremity pain: Secondary | ICD-10-CM

## 2015-10-15 DIAGNOSIS — G546 Phantom limb syndrome with pain: Secondary | ICD-10-CM

## 2015-10-15 DIAGNOSIS — M4806 Spinal stenosis, lumbar region: Secondary | ICD-10-CM | POA: Diagnosis not present

## 2015-10-15 DIAGNOSIS — M5416 Radiculopathy, lumbar region: Secondary | ICD-10-CM | POA: Diagnosis not present

## 2015-10-15 DIAGNOSIS — M19011 Primary osteoarthritis, right shoulder: Secondary | ICD-10-CM | POA: Diagnosis not present

## 2015-10-15 DIAGNOSIS — M79609 Pain in unspecified limb: Secondary | ICD-10-CM | POA: Diagnosis not present

## 2015-10-15 DIAGNOSIS — R52 Pain, unspecified: Secondary | ICD-10-CM

## 2015-10-15 DIAGNOSIS — M47817 Spondylosis without myelopathy or radiculopathy, lumbosacral region: Secondary | ICD-10-CM | POA: Diagnosis not present

## 2015-10-15 DIAGNOSIS — M48062 Spinal stenosis, lumbar region with neurogenic claudication: Secondary | ICD-10-CM

## 2015-10-15 LAB — PROTIME-INR
INR: 1.01
Prothrombin Time: 13.5 seconds (ref 11.4–15.0)

## 2015-10-15 LAB — APTT: APTT: 29 s (ref 24–36)

## 2015-10-15 MED ORDER — BUPIVACAINE HCL (PF) 0.25 % IJ SOLN: 30.0000 mL | Freq: Once | INTRAMUSCULAR | Status: DC

## 2015-10-15 MED ORDER — LIDOCAINE HCL (PF) 1 % IJ SOLN
INTRAMUSCULAR | Status: AC
  Administered 2015-10-15: 11:00:00
  Filled 2015-10-15: qty 10

## 2015-10-15 MED ORDER — FENTANYL CITRATE (PF) 100 MCG/2ML IJ SOLN: 100.0000 ug | Freq: Once | INTRAMUSCULAR | Status: DC

## 2015-10-15 MED ORDER — SODIUM CHLORIDE 0.9% FLUSH: 20.0000 mL | Freq: Once | INTRAVENOUS | Status: DC

## 2015-10-15 MED ORDER — ORPHENADRINE CITRATE 30 MG/ML IJ SOLN: 60.0000 mg | Freq: Once | INTRAMUSCULAR | Status: DC

## 2015-10-15 MED ORDER — SODIUM CHLORIDE 0.9 % IJ SOLN
INTRAMUSCULAR | Status: AC
  Administered 2015-10-15: 11:00:00
  Filled 2015-10-15: qty 20

## 2015-10-15 MED ORDER — LIDOCAINE HCL (PF) 1 % IJ SOLN: 10.0000 mL | Freq: Once | INTRAMUSCULAR | Status: DC

## 2015-10-15 MED ORDER — ORPHENADRINE CITRATE 30 MG/ML IJ SOLN
INTRAMUSCULAR | Status: AC
  Filled 2015-10-15: qty 2

## 2015-10-15 MED ORDER — TRIAMCINOLONE ACETONIDE 40 MG/ML IJ SUSP: 40.0000 mg | Freq: Once | INTRAMUSCULAR | Status: DC

## 2015-10-15 MED ORDER — DOXYCYCLINE HYCLATE 100 MG PO TABS: 100.0000 mg | ORAL_TABLET | Freq: Two times a day (BID) | ORAL | Status: DC

## 2015-10-15 MED ORDER — TRIAMCINOLONE ACETONIDE 40 MG/ML IJ SUSP
INTRAMUSCULAR | Status: AC
  Administered 2015-10-15: 11:00:00
  Filled 2015-10-15: qty 1

## 2015-10-15 MED ORDER — CIPROFLOXACIN IN D5W 400 MG/200ML IV SOLN: 400.0000 mg | Freq: Once | INTRAVENOUS | Status: DC

## 2015-10-15 MED ORDER — MIDAZOLAM HCL 5 MG/5ML IJ SOLN: 5.0000 mg | Freq: Once | INTRAMUSCULAR | Status: DC

## 2015-10-15 MED ORDER — CIPROFLOXACIN HCL 250 MG PO TABS: 250.0000 mg | ORAL_TABLET | Freq: Two times a day (BID) | ORAL | Status: DC

## 2015-10-15 MED ORDER — BUPIVACAINE HCL (PF) 0.25 % IJ SOLN
INTRAMUSCULAR | Status: AC
  Administered 2015-10-15: 11:00:00
  Filled 2015-10-15: qty 30

## 2015-10-15 MED ORDER — LACTATED RINGERS IV SOLN: 1000.0000 mL | INTRAVENOUS | Status: DC

## 2015-10-15 NOTE — Progress Notes (Signed)
Safety precautions to be maintained throughout the outpatient stay will include: orient to surroundings, keep bed in low position, maintain call bell within reach at all times, provide assistance with transfer out of bed and ambulation.  

## 2015-10-15 NOTE — Progress Notes (Signed)
   Subjective:    Patient ID: [name], male    DOB: 03-12-1951, 65 y.o.   MRN: [id number]  HPI PROCEDURE PERFORMED: Lumbar epidural steroid injection   NOTE: The patient is a 65 y.o. male who returns to [facility] for further evaluation and treatment of pain involving the lumbar and lower extremity region. 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 Due to is protrusion, short pedicles and facet disease. Mild foraminal encroachment bilaterally. L4-L5 annular rent associated focal central disc protrusion. Diffuse disc bulging facet hypertrophy moderately severe spinal and lateral recess stenosis with foraminal stenosis bilaterally. L5-S1 broad-based disc protrusion with short pedicles and facet disease with moderately severe spinal stenosis and lateral recess stenosis with bilateral foraminal encroachment. There is concern regarding lumbar stenosis and lumbar radiculopathy contributing to patient's symptomatology to significant degree The risks, benefits, and expectations of the procedure have been discussed and explained to the patient who was understanding and in agreement with suggested treatment plan. We will proceed with lumbar epidural steroid injection as discussed and as explained to the patient who is willing to proceed with procedure as planned.   DESCRIPTION OF PROCEDURE: Lumbar epidural steroid injection with  EKG, blood pressure, pulse, and pulse oximetry monitoring. The procedure was performed with the patient in the prone position under fluoroscopic guidance. A local anesthetic skin wheal of 1.5% plain lidocaine was accomplished at proposed entry site. An 18-gauge Tuohy epidural needle was inserted at the L 3 vertebral body level right of the midline via loss-of-resistance technique with negative heme and negative CSF return. A total of  4 mL of Preservative-Free normal saline with 40 mg of Kenalog injected incrementally via epidurally placed needle. Needle was removed.    A total of 40 mg of Kenalog was utilized for the procedure.   The patient tolerated the injection well.    PLAN:   1. Medications: We will continue presently prescribed medication oxycodone 2. Will consider modification of treatment regimen pending response to treatment rendered on today's visit and follow-up evaluation. 3. The patient is to follow-up with primary care physician Dr Jacklyn Shell regarding blood pressure and general medical condition status post lumbar epidural steroid injection performed on today's visit. 4. Surgical evaluation. Has been addressed 5. Neurological evaluation. Has been addressed 6. The patient may be a candidate for radiofrequency procedures, implantation device, and other treatment pending response to treatment and follow-up evaluation. 7. The patient has been advised to adhere to proper body mechanics and avoid activities which appear to aggravate condition. 8. The patient has been advised to call the Pain Management Center prior to scheduled return appointment should there be significant change in condition or should there be sign  The patient is understanding and agrees with the suggested  treatment plan    Review of Systems     Objective:   Physical Exam        Assessment & Plan:

## 2015-10-15 NOTE — Patient Instructions (Addendum)
Continue present medication and begin taking antibiotic doxycycline as prescribed. Please obtain your antibiotic doxycycline today and begin taking antibiotic today. Do not take aspirin today or tomorrow CAUTION  go immediately to emergency room if you develop back pain, leg pain, or weakness in your legs and tell be Dr. that you had an epidural steroid injection in your back and that you need immediate scan of your back to look for any abnormalities of the spine. You should not take any aspirin until Wednesday  F/U PCP Dr. Carlota Raspberry for evaliation of  BP and general medical  condition.  F/U surgical evaluation. May consider pending follow-up evaluations  F/U neurological evaluation. May consider pending follow-up evaluations  May consider radiofrequency rhizolysis or intraspinal procedures pending response to present treatment and F/U evaluation.  Patient to call [facility] Center should patient have concerns prior to scheduled return appointment. Epidural Steroid Injection Patient Information  Description: The epidural space surrounds the nerves as they exit the spinal cord.  In some patients, the nerves can be compressed and inflamed by a bulging disc or a tight spinal canal (spinal stenosis).  By injecting steroids into the epidural space, we can bring irritated nerves into direct contact with a potentially helpful medication.  These steroids act directly on the irritated nerves and can reduce swelling and inflammation which often leads to decreased pain.  Epidural steroids may be injected anywhere along the spine and from the neck to the low back depending upon the location of your pain.   After numbing the skin with local anesthetic (like Novocaine), a small needle is passed into the epidural space slowly.  You may experience a sensation of pressure while this is being done.  The entire block usually last less than 10 minutes.  Conditions which may be treated by epidural steroids:   Low  back and leg pain  Neck and arm pain  Spinal stenosis  Post-laminectomy syndrome  Herpes zoster (shingles) pain  Pain from compression fractures  Preparation for the injection:  1. Do not eat any solid food or dairy products within 6 hours of your appointment.  2. You may drink clear liquids up to 2 hours before appointment.  Clear liquids include water, black coffee, juice or soda.  No milk or cream please. 3. You may take your regular medication, including pain medications, with a sip of water before your appointment  Diabetics should hold regular insulin (if taken separately) and take 1/2 normal NPH dos the morning of the procedure.  Carry some sugar containing items with you to your appointment. 4. A driver must accompany you and be prepared to drive you home after your procedure.  5. Bring all your current medications with your. 6. An IV may be inserted and sedation may be given at the discretion of the physician.   7. A blood pressure cuff, EKG and other monitors will often be applied during the procedure.  Some patients may need to have extra oxygen administered for a short period. 8. You will be asked to provide medical information, including your allergies, prior to the procedure.  We must know immediately if you are taking blood thinners (like Coumadin/Warfarin)  Or if you are allergic to IV iodine contrast (dye). We must know if you could possible be pregnant.  Possible side-effects:  Bleeding from needle site  Infection (rare, may require surgery)  Nerve injury (rare)  Numbness & tingling (temporary)  Difficulty urinating (rare, temporary)  Spinal headache ( a headache worse with  upright posture)  Light -headedness (temporary)  Pain at injection site (several days)  Decreased blood pressure (temporary)  Weakness in arm/leg (temporary)  Pressure sensation in back/neck (temporary)  Call if you experience:  Fever/chills associated with headache or increased  back/neck pain.  Headache worsened by an upright position.  New onset weakness or numbness of an extremity below the injection site  Hives or difficulty breathing (go to the emergency room)  Inflammation or drainage at the infection site  Severe back/neck pain  Any new symptoms which are concerning to you  Please note:  Although the local anesthetic injected can often make your back or neck feel good for several hours after the injection, the pain will likely return.  It takes 3-7 days for steroids to work in the epidural space.  You may not notice any pain relief for at least that one week.  If effective, we will often do a series of three injections spaced 3-6 weeks apart to maximally decrease your pain.  After the initial series, we generally will wait several months before considering a repeat injection of the same type.  If you have any questions, please call [phone] [facility] Inst[facility]  General Discharge Instructions :  If you need to reach your doctor call: Monday-Friday 8:00 am - 4:00 pm at [phone] or toll free [phone].  After clinic hours [phone] to have operator reach doctor.  Bring all of your medication bottles to all your appointments in the pain clinic.  To cancel or reschedule your appointment with Pain Management please remember to call 24 hours in advance to avoid a fee.  Refer to the educational materials which you have been given on: General Risks, I had my Procedure. Discharge Instructions, Post Sedation.  Post Procedure Instructions:  The drugs you were given will stay in your system until tomorrow, so for the next 24 hours you should not drive, make any legal decisions or drink any alcoholic beverages.  You may eat anything you prefer, but it is better to start with liquids then soups and crackers, and gradually work up to solid foods.  Please notify your doctor  immediately if you have any unusual bleeding, trouble breathing or pain that is not related to your normal pain.  Depending on the type of procedure that was done, some parts of your body may feel week and/or numb.  This usually clears up by tonight or the next day.  Walk with the use of an assistive device or accompanied by an adult for the 24 hours.  You may use ice on the affected area for the first 24 hours.  Put ice in a Ziploc bag and cover with a towel and place against area 15 minutes on 15 minutes off.  You may switch to heat after 24 hours.

## 2015-10-16 ENCOUNTER — Other Ambulatory Visit: Payer: Self-pay | Admitting: Cardiovascular Disease

## 2015-10-16 NOTE — Telephone Encounter (Signed)
Message left

## 2015-10-17 ENCOUNTER — Telehealth: Payer: Self-pay | Admitting: Family Medicine

## 2015-10-17 NOTE — Telephone Encounter (Signed)
Spoke with patient about his eye exam.  He is going to call Dr. Patrici Ranks office and schedule the exam himself.  I asked him to please let us know the date that it is scheduled for.

## 2015-10-21 ENCOUNTER — Other Ambulatory Visit: Payer: Self-pay | Admitting: Family Medicine

## 2015-10-28 ENCOUNTER — Other Ambulatory Visit: Payer: Self-pay | Admitting: Family Medicine

## 2015-10-30 NOTE — Telephone Encounter (Signed)
I do not mind refilling this, but is this not being prescribed by his pain management specialist?

## 2015-10-31 ENCOUNTER — Encounter: Payer: Self-pay | Admitting: Family Medicine

## 2015-10-31 ENCOUNTER — Ambulatory Visit: Payer: Medicare Other | Admitting: Family Medicine

## 2015-10-31 ENCOUNTER — Ambulatory Visit (INDEPENDENT_AMBULATORY_CARE_PROVIDER_SITE_OTHER): Payer: Medicare Other | Admitting: Family Medicine

## 2015-10-31 VITALS — BP 120/74 | HR 78 | Temp 98.4°F | Resp 16 | Wt 280.0 lb

## 2015-10-31 DIAGNOSIS — N5082 Scrotal pain: Secondary | ICD-10-CM

## 2015-10-31 DIAGNOSIS — B372 Candidiasis of skin and nail: Secondary | ICD-10-CM | POA: Diagnosis not present

## 2015-10-31 DIAGNOSIS — N50819 Testicular pain, unspecified: Secondary | ICD-10-CM | POA: Diagnosis not present

## 2015-10-31 DIAGNOSIS — R319 Hematuria, unspecified: Secondary | ICD-10-CM | POA: Diagnosis not present

## 2015-10-31 DIAGNOSIS — G894 Chronic pain syndrome: Secondary | ICD-10-CM

## 2015-10-31 DIAGNOSIS — R1030 Lower abdominal pain, unspecified: Secondary | ICD-10-CM | POA: Diagnosis not present

## 2015-10-31 DIAGNOSIS — F329 Major depressive disorder, single episode, unspecified: Secondary | ICD-10-CM | POA: Diagnosis not present

## 2015-10-31 DIAGNOSIS — F32A Depression, unspecified: Secondary | ICD-10-CM

## 2015-10-31 DIAGNOSIS — N200 Calculus of kidney: Secondary | ICD-10-CM

## 2015-10-31 LAB — POCT URINALYSIS DIP (MANUAL ENTRY)
BILIRUBIN UA: NEGATIVE
BILIRUBIN UA: NEGATIVE
Nitrite, UA: NEGATIVE
PH UA: 7
Protein Ur, POC: 100 — AB
Spec Grav, UA: 1.02
Urobilinogen, UA: >=8

## 2015-10-31 LAB — POCT CBC
Granulocyte percent: 34.2 %G — AB (ref 37–80)
HCT, POC: 39.8 % — AB (ref 43.5–53.7)
Hemoglobin: 13.3 g/dL — AB (ref 14.1–18.1)
Lymph, poc: 4 — AB (ref 0.6–3.4)
MCH, POC: 34.4 pg — AB (ref 27–31.2)
MCHC: 33.3 g/dL (ref 31.8–35.4)
MCV: 103.3 fL — AB (ref 80–97)
MID (CBC): 0.4 (ref 0–0.9)
MPV: 7 fL (ref 0–99.8)
POC GRANULOCYTE: 2.3 (ref 2–6.9)
POC LYMPH PERCENT: 59.5 %L — AB (ref 10–50)
POC MID %: 6.3 % (ref 0–12)
Platelet Count, POC: 341 10*3/uL (ref 142–424)
RBC: 3.86 M/uL — AB (ref 4.69–6.13)
RDW, POC: 18.1 %
WBC: 6.7 10*3/uL (ref 4.6–10.2)

## 2015-10-31 LAB — POC MICROSCOPIC URINALYSIS (UMFC)

## 2015-10-31 MED ORDER — DULOXETINE HCL 30 MG PO CPEP: 30.0000 mg | ORAL_CAPSULE | Freq: Two times a day (BID) | ORAL | Status: DC

## 2015-10-31 MED ORDER — NYSTATIN 100000 UNIT/GM EX POWD: Freq: Three times a day (TID) | CUTANEOUS | Status: DC

## 2015-10-31 MED ORDER — CIPROFLOXACIN HCL 500 MG PO TABS: 500.0000 mg | ORAL_TABLET | Freq: Two times a day (BID) | ORAL | Status: DC

## 2015-10-31 NOTE — Telephone Encounter (Signed)
Refilled
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
## 2015-10-31 NOTE — Patient Instructions (Addendum)
Try to increase Cymbalta to twice per day for depression and pain. If tolerating that dose - let me know so I can send in right medication.   Nystatin powder to rash below abdomen and groin.   We will schedule ultrasound of scrotum to evaluate other causes of pain, but start antibiotic (cipro) for now in case there is an infection of urine, epididymis (part of testicle), or prostate. If pain not improving in next 2-3 days - recommend scheduling a CT scan of your abdomen  Call me to schedule this if not improving. Depending on those results - may need other testing for prostate or urology evaluation. We were unable to obtain enough blood for prostate test today, so may need to repeat blood draw in a few days if not improving.   Return to the clinic or go to the nearest emergency room if any of your symptoms worsen or new symptoms occur.  Keep follow up with Dr. Primus Bravo and diabetes specialist tomorrow.   Hematuria, Adult Hematuria is blood in your urine. It can be caused by a bladder infection, kidney infection, prostate infection, kidney stone, or cancer of your urinary tract. Infections can usually be treated with medicine, and a kidney stone usually will pass through your urine. If neither of these is the cause of your hematuria, further workup to find out the reason may be needed. It is very important that you tell your health care provider about any blood you see in your urine, even if the blood stops without treatment or happens without causing pain. Blood in your urine that happens and then stops and then happens again can be a symptom of a very serious condition. Also, pain is not a symptom in the initial stages of many urinary cancers. HOME CARE INSTRUCTIONS   Drink lots of fluid, 3-4 quarts a day. If you have been diagnosed with an infection, cranberry juice is especially recommended, in addition to large amounts of water.  Avoid caffeine, tea, and carbonated beverages because they tend to  irritate the bladder.  Avoid alcohol because it may irritate the prostate.  Take all medicines as directed by your health care provider.  If you were prescribed an antibiotic medicine, finish it all even if you start to feel better.  If you have been diagnosed with a kidney stone, follow your health care provider's instructions regarding straining your urine to catch the stone.  Empty your bladder often. Avoid holding urine for long periods of time.  After a bowel movement, women should cleanse front to back. Use each tissue only once.  Empty your bladder before and after sexual intercourse if you are a male. SEEK MEDICAL CARE IF:  You develop back pain.  You have a fever.  You have a feeling of sickness in your stomach (nausea) or vomiting.  Your symptoms are not better in 3 days. Return sooner if you are getting worse. SEEK IMMEDIATE MEDICAL CARE IF:   You develop severe vomiting and are unable to keep the medicine down.  You develop severe back or abdominal pain despite taking your medicines.  You begin passing a large amount of blood or clots in your urine.  You feel extremely weak or faint, or you pass out. MAKE SURE YOU:   Understand these instructions.  Will watch your condition.  Will get help right away if you are not doing well or get worse.   This information is not intended to replace advice given to you by your health  care provider. Make sure you discuss any questions you have with your health care provider.   Document Released: 09/01/2005 Document Revised: 09/22/2014 Document Reviewed: 05/02/2013 Elsevier Interactive Patient Education [employer].

## 2015-11-01 ENCOUNTER — Other Ambulatory Visit: Payer: Self-pay | Admitting: Physician Assistant

## 2015-11-01 ENCOUNTER — Other Ambulatory Visit: Payer: Self-pay | Admitting: Family Medicine

## 2015-11-01 DIAGNOSIS — R7989 Other specified abnormal findings of blood chemistry: Secondary | ICD-10-CM

## 2015-11-01 DIAGNOSIS — E782 Mixed hyperlipidemia: Secondary | ICD-10-CM | POA: Diagnosis not present

## 2015-11-01 DIAGNOSIS — Z6836 Body mass index (BMI) 36.0-36.9, adult: Secondary | ICD-10-CM | POA: Diagnosis not present

## 2015-11-01 DIAGNOSIS — E1165 Type 2 diabetes mellitus with hyperglycemia: Secondary | ICD-10-CM | POA: Diagnosis not present

## 2015-11-01 DIAGNOSIS — E669 Obesity, unspecified: Secondary | ICD-10-CM | POA: Diagnosis not present

## 2015-11-01 DIAGNOSIS — Z13820 Encounter for screening for osteoporosis: Secondary | ICD-10-CM

## 2015-11-01 DIAGNOSIS — Z794 Long term (current) use of insulin: Secondary | ICD-10-CM | POA: Diagnosis not present

## 2015-11-01 DIAGNOSIS — I10 Essential (primary) hypertension: Secondary | ICD-10-CM | POA: Diagnosis not present

## 2015-11-02 LAB — URINE CULTURE: Colony Count: 7000

## 2015-11-02 NOTE — Telephone Encounter (Signed)
Would want to see level first. I tried to order as lab only visit, but not covered by [facility] the way I ordered it. If someone can place lab only order - can have done here,  at his endocrinologist, or other provider if more convenient.Marland Kitchen How has he been taking the vitamin D?

## 2015-11-02 NOTE — Telephone Encounter (Signed)
Dr Carlota Raspberry, [place] to RF vit D? I don't see a lab in [place] since 10/2012.

## 2015-11-05 ENCOUNTER — Emergency Department (HOSPITAL_COMMUNITY): Payer: Medicare Other

## 2015-11-05 ENCOUNTER — Emergency Department (HOSPITAL_COMMUNITY)
Admission: EM | Admit: 2015-11-05 | Discharge: 2015-11-05 | Disposition: A | Discharge status: Home / Self Care | Payer: Medicare Other | Attending: Emergency Medicine | Admitting: Emergency Medicine

## 2015-11-05 ENCOUNTER — Encounter (HOSPITAL_COMMUNITY): Payer: Self-pay

## 2015-11-05 DIAGNOSIS — E109 Type 1 diabetes mellitus without complications: Secondary | ICD-10-CM | POA: Insufficient documentation

## 2015-11-05 DIAGNOSIS — F329 Major depressive disorder, single episode, unspecified: Secondary | ICD-10-CM | POA: Diagnosis not present

## 2015-11-05 DIAGNOSIS — Z87438 Personal history of other diseases of male genital organs: Secondary | ICD-10-CM | POA: Diagnosis not present

## 2015-11-05 DIAGNOSIS — Z792 Long term (current) use of antibiotics: Secondary | ICD-10-CM | POA: Insufficient documentation

## 2015-11-05 DIAGNOSIS — N1339 Other hydronephrosis: Secondary | ICD-10-CM | POA: Diagnosis not present

## 2015-11-05 DIAGNOSIS — Z8614 Personal history of Methicillin resistant Staphylococcus aureus infection: Secondary | ICD-10-CM | POA: Diagnosis not present

## 2015-11-05 DIAGNOSIS — Z8739 Personal history of other diseases of the musculoskeletal system and connective tissue: Secondary | ICD-10-CM | POA: Insufficient documentation

## 2015-11-05 DIAGNOSIS — Z8719 Personal history of other diseases of the digestive system: Secondary | ICD-10-CM | POA: Insufficient documentation

## 2015-11-05 DIAGNOSIS — R1012 Left upper quadrant pain: Secondary | ICD-10-CM | POA: Diagnosis present

## 2015-11-05 DIAGNOSIS — Z862 Personal history of diseases of the blood and blood-forming organs and certain disorders involving the immune mechanism: Secondary | ICD-10-CM | POA: Diagnosis not present

## 2015-11-05 DIAGNOSIS — Z794 Long term (current) use of insulin: Secondary | ICD-10-CM | POA: Diagnosis not present

## 2015-11-05 DIAGNOSIS — N201 Calculus of ureter: Secondary | ICD-10-CM | POA: Diagnosis not present

## 2015-11-05 DIAGNOSIS — Z7982 Long term (current) use of aspirin: Secondary | ICD-10-CM | POA: Insufficient documentation

## 2015-11-05 DIAGNOSIS — N1330 Unspecified hydronephrosis: Secondary | ICD-10-CM | POA: Diagnosis not present

## 2015-11-05 DIAGNOSIS — E7800 Pure hypercholesterolemia, unspecified: Secondary | ICD-10-CM | POA: Diagnosis not present

## 2015-11-05 DIAGNOSIS — Z79899 Other long term (current) drug therapy: Secondary | ICD-10-CM | POA: Insufficient documentation

## 2015-11-05 DIAGNOSIS — Z903 Acquired absence of stomach [part of]: Secondary | ICD-10-CM | POA: Diagnosis not present

## 2015-11-05 DIAGNOSIS — Z87891 Personal history of nicotine dependence: Secondary | ICD-10-CM | POA: Diagnosis not present

## 2015-11-05 DIAGNOSIS — Z87828 Personal history of other (healed) physical injury and trauma: Secondary | ICD-10-CM | POA: Insufficient documentation

## 2015-11-05 DIAGNOSIS — N200 Calculus of kidney: Secondary | ICD-10-CM | POA: Diagnosis not present

## 2015-11-05 DIAGNOSIS — Z791 Long term (current) use of non-steroidal anti-inflammatories (NSAID): Secondary | ICD-10-CM | POA: Insufficient documentation

## 2015-11-05 DIAGNOSIS — I10 Essential (primary) hypertension: Secondary | ICD-10-CM | POA: Insufficient documentation

## 2015-11-05 DIAGNOSIS — N23 Unspecified renal colic: Secondary | ICD-10-CM | POA: Diagnosis not present

## 2015-11-05 LAB — URINALYSIS, ROUTINE W REFLEX MICROSCOPIC
BILIRUBIN URINE: NEGATIVE
Ketones, ur: NEGATIVE mg/dL
Leukocytes, UA: NEGATIVE
Nitrite: NEGATIVE
PH: 5 (ref 5.0–8.0)
Protein, ur: NEGATIVE mg/dL
SPECIFIC GRAVITY, URINE: 1.019 (ref 1.005–1.030)

## 2015-11-05 LAB — CBC
HEMATOCRIT: 37.1 % — AB (ref 39.0–52.0)
HEMOGLOBIN: 12.5 g/dL — AB (ref 13.0–17.0)
MCH: 34.7 pg — AB (ref 26.0–34.0)
MCHC: 33.7 g/dL (ref 30.0–36.0)
MCV: 103.1 fL — AB (ref 78.0–100.0)
Platelets: 249 10*3/uL (ref 150–400)
RBC: 3.6 MIL/uL — ABNORMAL LOW (ref 4.22–5.81)
RDW: 15.8 % — ABNORMAL HIGH (ref 11.5–15.5)
WBC: 6.1 10*3/uL (ref 4.0–10.5)

## 2015-11-05 LAB — COMPREHENSIVE METABOLIC PANEL
ALBUMIN: 3.8 g/dL (ref 3.5–5.0)
ALK PHOS: 142 U/L — AB (ref 38–126)
ALT: 17 U/L (ref 17–63)
ANION GAP: 11 (ref 5–15)
AST: 19 U/L (ref 15–41)
BUN: 13 mg/dL (ref 6–20)
CALCIUM: 10 mg/dL (ref 8.9–10.3)
CHLORIDE: 102 mmol/L (ref 101–111)
CO2: 23 mmol/L (ref 22–32)
Creatinine, Ser: 1.28 mg/dL — ABNORMAL HIGH (ref 0.61–1.24)
GFR calc Af Amer: >60 mL/min (ref >60)
GFR calc non Af Amer: 58 mL/min — ABNORMAL LOW (ref >60)
GLUCOSE: 352 mg/dL — AB (ref 65–99)
POTASSIUM: 4.3 mmol/L (ref 3.5–5.1)
SODIUM: 136 mmol/L (ref 135–145)
Total Bilirubin: 0.8 mg/dL (ref 0.3–1.2)
Total Protein: 6.7 g/dL (ref 6.5–8.1)

## 2015-11-05 LAB — LIPASE, BLOOD: LIPASE: 17 U/L (ref 11–51)

## 2015-11-05 LAB — URINE MICROSCOPIC-ADD ON

## 2015-11-05 MED ORDER — TAMSULOSIN HCL 0.4 MG PO CAPS
0.4000 mg | ORAL_CAPSULE | Freq: Every day | ORAL | Status: DC
Start: 2015-11-05 — End: 2015-12-03

## 2015-11-05 MED ORDER — POLYETHYLENE GLYCOL 3350 17 GM/SCOOP PO POWD: 17.0000 g | Freq: Every day | ORAL | Status: DC

## 2015-11-05 MED ORDER — HYDROMORPHONE HCL 1 MG/ML IJ SOLN
1.0000 mg | Freq: Once | INTRAMUSCULAR | Status: AC
  Administered 2015-11-05: 1 mg via INTRAVENOUS
  Filled 2015-11-05: qty 1

## 2015-11-05 MED ORDER — OXYCODONE-ACETAMINOPHEN 5-325 MG PO TABS
1.0000 | ORAL_TABLET | Freq: Once | ORAL | Status: AC
  Administered 2015-11-05: 1 via ORAL
  Filled 2015-11-05: qty 1

## 2015-11-05 MED ORDER — ONDANSETRON HCL 4 MG/2ML IJ SOLN
4.0000 mg | Freq: Once | INTRAMUSCULAR | Status: AC
  Administered 2015-11-05: 4 mg via INTRAVENOUS
  Filled 2015-11-05: qty 2

## 2015-11-05 MED ORDER — NAPROXEN 500 MG PO TABS: 500.0000 mg | ORAL_TABLET | Freq: Two times a day (BID) | ORAL | Status: DC

## 2015-11-05 MED ORDER — IOHEXOL 300 MG/ML  SOLN
100.0000 mL | Freq: Once | INTRAMUSCULAR | Status: AC | PRN
  Administered 2015-11-05: 100 mL via INTRAVENOUS

## 2015-11-05 MED ORDER — OXYCODONE-ACETAMINOPHEN 5-325 MG PO TABS
ORAL_TABLET | ORAL | Status: AC
  Filled 2015-11-05: qty 1

## 2015-11-05 MED ORDER — KETOROLAC TROMETHAMINE 30 MG/ML IJ SOLN
30.0000 mg | Freq: Once | INTRAMUSCULAR | Status: AC
  Administered 2015-11-05: 30 mg via INTRAVENOUS
  Filled 2015-11-05: qty 1

## 2015-11-05 MED ORDER — OXYCODONE-ACETAMINOPHEN 5-325 MG PO TABS
1.0000 | ORAL_TABLET | Freq: Once | ORAL | Status: AC
  Administered 2015-11-05: 1 via ORAL

## 2015-11-05 MED ORDER — SODIUM CHLORIDE 0.9 % IV BOLUS (SEPSIS)
1000.0000 mL | Freq: Once | INTRAVENOUS | Status: AC
  Administered 2015-11-05: 1000 mL via INTRAVENOUS

## 2015-11-05 NOTE — Telephone Encounter (Signed)
James Hail, do you know how to put this lab only visit in so that [facility] will cover it?

## 2015-11-05 NOTE — Discharge Instructions (Signed)
Please read and follow all provided instructions.  Your diagnoses today include:  1. Ureteral colic   2. Bilateral hydronephrosis     Tests performed today include:  Urine test that showed blood in your urine and no infection  CT scan which showed a 2 millimeter kidney on the left side  Blood test that showed high normal kidney function  Vital signs. See below for your results today.   Medications prescribed:   Flomax (tamsulosin) - relaxes smooth muscle to help kidney stones pass   Naproxen - anti-inflammatory pain medication  Do not exceed 500mg  naproxen every 12 hours, take with food  You have been prescribed an anti-inflammatory medication or NSAID. Take with food. Take smallest effective dose for the shortest duration needed for your pain. Stop taking if you experience stomach pain or vomiting.   Take any prescribed medications only as directed.  Home care instructions:  Follow any educational materials contained in this packet.  Please double your fluid intake for the next several days. Strain your urine and save any stones that may pass.   BE VERY CAREFUL not to take multiple medicines containing Tylenol (also called acetaminophen). Doing so can lead to an overdose which can damage your liver and cause liver failure and possibly death.   Follow-up instructions: Please follow-up with your urologist or the urologist referral (provided on front page) for further evaluation of your symptoms.  If you need to return to the Emergency Department, go to [facility] and not [facility]. The urologists are located at [facility] and can better care for you at this location.  Return instructions:  If you need to return to the Emergency Department, go to [facility] and not [facility]. The urologists are located at [facility] and can better care for you at this location.   Please return to the Emergency Department if you experience worsening  symptoms.  Please return if you develop fever or uncontrolled pain or vomiting.  Please return if you have any other emergent concerns.  Additional Information:  Your vital signs today were: BP 156/72 mmHg   Pulse 89   Temp(Src) 98.4 F (36.9 C) (Oral)   Resp 14   SpO2 93% If your blood pressure (BP) was elevated above 135/85 this visit, please have this repeated by your doctor within one month. --------------
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
## 2015-11-05 NOTE — ED Notes (Signed)
Patient here with ongoing left sided abdominal pain x 4 days, taking cipro as prescribed for hematuria but reports that the pain is not worse. Constipation on/off x 6 days

## 2015-11-06 ENCOUNTER — Encounter: Payer: Self-pay | Admitting: Pain Medicine

## 2015-11-06 ENCOUNTER — Ambulatory Visit: Payer: Medicare Other | Attending: Pain Medicine | Admitting: Pain Medicine

## 2015-11-06 VITALS — BP 126/47 | HR 85 | Temp 98.3°F | Resp 16 | Ht 73.0 in | Wt 280.0 lb

## 2015-11-06 DIAGNOSIS — M791 Myalgia: Secondary | ICD-10-CM | POA: Diagnosis not present

## 2015-11-06 DIAGNOSIS — Z89511 Acquired absence of right leg below knee: Secondary | ICD-10-CM

## 2015-11-06 DIAGNOSIS — G588 Other specified mononeuropathies: Secondary | ICD-10-CM | POA: Diagnosis not present

## 2015-11-06 DIAGNOSIS — G546 Phantom limb syndrome with pain: Secondary | ICD-10-CM

## 2015-11-06 DIAGNOSIS — M5124 Other intervertebral disc displacement, thoracic region: Secondary | ICD-10-CM | POA: Diagnosis not present

## 2015-11-06 DIAGNOSIS — M4806 Spinal stenosis, lumbar region: Secondary | ICD-10-CM | POA: Insufficient documentation

## 2015-11-06 DIAGNOSIS — M48062 Spinal stenosis, lumbar region with neurogenic claudication: Secondary | ICD-10-CM

## 2015-11-06 DIAGNOSIS — N200 Calculus of kidney: Secondary | ICD-10-CM | POA: Diagnosis not present

## 2015-11-06 DIAGNOSIS — M5136 Other intervertebral disc degeneration, lumbar region: Secondary | ICD-10-CM | POA: Insufficient documentation

## 2015-11-06 DIAGNOSIS — M47817 Spondylosis without myelopathy or radiculopathy, lumbosacral region: Secondary | ICD-10-CM | POA: Diagnosis not present

## 2015-11-06 DIAGNOSIS — M533 Sacrococcygeal disorders, not elsewhere classified: Secondary | ICD-10-CM | POA: Diagnosis not present

## 2015-11-06 DIAGNOSIS — M19011 Primary osteoarthritis, right shoulder: Secondary | ICD-10-CM

## 2015-11-06 DIAGNOSIS — M5416 Radiculopathy, lumbar region: Secondary | ICD-10-CM | POA: Diagnosis not present

## 2015-11-06 DIAGNOSIS — M5134 Other intervertebral disc degeneration, thoracic region: Secondary | ICD-10-CM | POA: Diagnosis not present

## 2015-11-06 DIAGNOSIS — M5126 Other intervertebral disc displacement, lumbar region: Secondary | ICD-10-CM | POA: Diagnosis not present

## 2015-11-06 DIAGNOSIS — M79606 Pain in leg, unspecified: Secondary | ICD-10-CM | POA: Diagnosis present

## 2015-11-06 DIAGNOSIS — M545 Low back pain: Secondary | ICD-10-CM | POA: Diagnosis present

## 2015-11-06 MED ORDER — OXYCODONE HCL 5 MG PO TABS: ORAL_TABLET | ORAL | Status: DC

## 2015-11-06 NOTE — Patient Instructions (Addendum)
PLAN   Continue present medication oxycodone. OxyContin has not been approved by insurance  F/U PCP Dr. Carlota Raspberry for evaliation of  BP and general medical  condition. Also discuss referral to urology with Dr. Carlota Raspberry as we discussed today  F/U surgical evaluation. May consider pending follow-up evaluations  F/U neurological evaluation. May consider PNCV/EMG studies and other studies pending follow-up evaluations  May consider radiofrequency rhizolysis or intraspinal procedures pending response to present treatment and F/U evaluation.  Patient to call [facility] should patient have concerns prior to scheduled return appointment  A prescription for OXYCODONE was given to you today.
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
## 2015-11-06 NOTE — Progress Notes (Signed)
Safety precautions to be maintained throughout the outpatient stay will include: orient to surroundings, keep bed in low position, maintain call bell within reach at all times, provide assistance with transfer out of bed and ambulation. Patient was in ED yesterday for Kidney stones.

## 2015-11-06 NOTE — Addendum Note (Signed)
Addended by: [name] R on: 11/06/2015 11:01 PM   Modules accepted: Orders

## 2015-11-12 DIAGNOSIS — N200 Calculus of kidney: Secondary | ICD-10-CM | POA: Diagnosis not present

## 2015-11-12 DIAGNOSIS — N433 Hydrocele, unspecified: Secondary | ICD-10-CM | POA: Diagnosis not present

## 2015-11-12 DIAGNOSIS — Z0000 Encounter for general adult medical examination without abnormal findings: Secondary | ICD-10-CM | POA: Diagnosis not present

## 2015-11-13 ENCOUNTER — Other Ambulatory Visit: Payer: Self-pay | Admitting: Urology

## 2015-11-15 ENCOUNTER — Telehealth: Payer: Self-pay | Admitting: Pain Medicine

## 2015-11-15 ENCOUNTER — Telehealth: Payer: Self-pay | Admitting: *Deleted

## 2015-11-15 MED FILL — HYDROmorphone HCL 4 MG TABS: 4 | 5 days supply | Qty: 30 | Fill #0

## 2015-11-15 NOTE — Telephone Encounter (Signed)
Patient having extreme pain and needs script for dilaudid ? Please call patient to discuss

## 2015-11-15 NOTE — Telephone Encounter (Signed)
Spoke with patient re; his request for dilaudid.  Patient is having a problem with kidney stones and has been treated by a nephrologist but declined pain management because he thought he had to only go through [place] for that type of treatment. Explained to [name], after speaking with Dr Primus Bravo, that sometimes acute pain would arise, like kidney stones, and another Dr Lu Duffel have to intervene with pain management and in these instances if he would just let us know, that would cover his pain management policy agreement.  [name] verbalizes understanding of this information and asked if I would please call his Nephrologist Dr Alyson Ingles to let him know this information.  [name], transferred upfront to change his appt to accommodate his surgical schedule with nephrologist.

## 2015-11-15 NOTE — Telephone Encounter (Signed)
Spoke with Glenard Haring with [facility] in [place] to let them know that it is okay for Dr Alyson Ingles to prescribe for [facility] during his acute pain of "kidney stones".  Glenard Haring stated to me that she will make a note in the chart and will let Mr Mendillo know when pain medication is available for him.

## 2015-11-16 ENCOUNTER — Other Ambulatory Visit: Payer: Self-pay | Admitting: Urology

## 2015-11-16 DIAGNOSIS — N200 Calculus of kidney: Secondary | ICD-10-CM

## 2015-11-17 NOTE — Telephone Encounter (Signed)
error 

## 2015-11-19 ENCOUNTER — Other Ambulatory Visit: Payer: Self-pay | Admitting: Family Medicine

## 2015-11-21 ENCOUNTER — Telehealth: Payer: Self-pay | Admitting: *Deleted

## 2015-11-21 ENCOUNTER — Telehealth: Payer: Self-pay | Admitting: Pain Medicine

## 2015-11-21 NOTE — Telephone Encounter (Signed)
[name] and Nurses Patient must have clearance from primary care physician

## 2015-11-21 NOTE — Telephone Encounter (Signed)
Patient has surgery set up for 12-03-15 and 12-24-15, they need surgical clearance from dr crisp before this can happen, forms were sent in and left at nurses station/ please call Reeves at [phone] and let her know status

## 2015-11-21 NOTE — Telephone Encounter (Signed)
Spoke with Chasity @ [facility] to let her know that Mr Sneed will need to obtain surgical clearance from his PCP per Dr Primus Bravo.

## 2015-11-23 ENCOUNTER — Other Ambulatory Visit: Payer: Self-pay | Admitting: Family Medicine

## 2015-11-26 NOTE — Patient Instructions (Addendum)
[place]  11/26/2015   Your procedure is scheduled on: 12/03/2015    Report to [facility] Main  Entrance take [place]  elevators to 3rd floor to  [place] at     [place] AM.  Call this number if you have problems the morning of surgery [phone]   Remember: ONLY 1 PERSON MAY GO WITH YOU TO SHORT STAY TO GET  READY MORNING OF YOUR SURGERY.  Do not eat food or drink liquids :After Midnight.              Eat a good healthy snack prior to bedtime.      Take these medicines the morning of surgery with A SIP OF WATER: Dexilant, Lyrica, Oxycodone if needed DO NOT TAKE ANY DIABETIC MEDICATIONS DAY OF YOUR SURGERY                               You may not have any metal on your body including hair pins and              piercings  Do not wear jewelry,  lotions, powders or perfumes, deodorant                      Men may shave face and neck.   Do not bring valuables to the hospital. [place].  Contacts, dentures or bridgework may not be worn into surgery.  Leave suitcase in the car. After surgery it may be brought to your room.         Special Instructions: coughing and deep breathing exercises, leg exercises               Please read over the following fact sheets you were given: _____________________________________________________________________             [facility] - Preparing for Surgery Before surgery, you can play an important role.  Because skin is not sterile, your skin needs to be as free of germs as possible.  You can reduce the number of germs on your skin by washing with CHG (chlorahexidine gluconate) soap before surgery.  CHG is an antiseptic cleaner which kills germs and bonds with the skin to continue killing germs even after washing. Please DO NOT use if you have an allergy to CHG or antibacterial soaps.  If your skin becomes reddened/irritated stop using the CHG and inform your  nurse when you arrive at Short Stay. Do not shave (including legs and underarms) for at least 48 hours prior to the first CHG shower.  You may shave your face/neck. Please follow these instructions carefully:  1.  Shower with CHG Soap the night before surgery and the  morning of Surgery.  2.  If you choose to wash your hair, wash your hair first as usual with your  normal  shampoo.  3.  After you shampoo, rinse your hair and body thoroughly to remove the  shampoo.                           4.  Use CHG as you would any other liquid soap.  You can apply chg directly  to the skin and wash  Gently with a scrungie or clean washcloth.  5.  Apply the CHG Soap to your body ONLY FROM THE NECK DOWN.   Do not use on face/ open                           Wound or open sores. Avoid contact with eyes, ears mouth and genitals (private parts).                       Wash face,  Genitals (private parts) with your normal soap.             6.  Wash thoroughly, paying special attention to the area where your surgery  will be performed.  7.  Thoroughly rinse your body with warm water from the neck down.  8.  DO NOT shower/wash with your normal soap after using and rinsing off  the CHG Soap.                9.  Pat yourself dry with a clean towel.            10.  Wear clean pajamas.            11.  Place clean sheets on your bed the night of your first shower and do not  sleep with pets. Day of Surgery : Do not apply any lotions/deodorants the morning of surgery.  Please wear clean clothes to the hospital/surgery center.  FAILURE TO FOLLOW THESE INSTRUCTIONS MAY RESULT IN THE CANCELLATION OF YOUR SURGERY PATIENT SIGNATURE_________________________________  NURSE SIGNATURE__________________________________  ________________________________________________________________________

## 2015-11-27 ENCOUNTER — Encounter (HOSPITAL_COMMUNITY)
Admission: RE | Admit: 2015-11-27 | Discharge: 2015-11-27 | Disposition: A | Discharge status: Home / Self Care | Payer: Medicare Other | Source: Ambulatory Visit | Attending: Urology | Admitting: Urology

## 2015-11-27 ENCOUNTER — Encounter (HOSPITAL_COMMUNITY): Payer: Self-pay

## 2015-11-27 DIAGNOSIS — Z01812 Encounter for preprocedural laboratory examination: Secondary | ICD-10-CM | POA: Diagnosis not present

## 2015-11-27 DIAGNOSIS — N200 Calculus of kidney: Secondary | ICD-10-CM | POA: Diagnosis not present

## 2015-11-27 HISTORY — DX: Gastro-esophageal reflux disease without esophagitis: K21.9

## 2015-11-27 LAB — BASIC METABOLIC PANEL
ANION GAP: 9 (ref 5–15)
BUN: 11 mg/dL (ref 6–20)
CALCIUM: 9.7 mg/dL (ref 8.9–10.3)
CO2: 26 mmol/L (ref 22–32)
Chloride: 103 mmol/L (ref 101–111)
Creatinine, Ser: 1.03 mg/dL (ref 0.61–1.24)
GFR calc non Af Amer: >60 mL/min (ref >60)
Glucose, Bld: 265 mg/dL — ABNORMAL HIGH (ref 65–99)
POTASSIUM: 4.7 mmol/L (ref 3.5–5.1)
Sodium: 138 mmol/L (ref 135–145)

## 2015-11-27 LAB — CBC
HCT: 36.6 % — ABNORMAL LOW (ref 39.0–52.0)
HEMOGLOBIN: 13 g/dL (ref 13.0–17.0)
MCH: 35.7 pg — AB (ref 26.0–34.0)
MCHC: 35.5 g/dL (ref 30.0–36.0)
MCV: 100.5 fL — ABNORMAL HIGH (ref 78.0–100.0)
Platelets: 296 10*3/uL (ref 150–400)
RBC: 3.64 MIL/uL — AB (ref 4.22–5.81)
RDW: 16.1 % — ABNORMAL HIGH (ref 11.5–15.5)
WBC: 6.8 10*3/uL (ref 4.0–10.5)

## 2015-11-27 NOTE — Progress Notes (Signed)
BMp done 11/27/15 faxed via EPIC to Dr [name].

## 2015-11-27 NOTE — Progress Notes (Signed)
Called and requested LOV note from Ines Bloomer at [facility] ( Endocrinology) and Sliding Scale ranges for Sliding Scale Insulin.

## 2015-11-27 NOTE — Progress Notes (Addendum)
Patient to call back with the range of his Sliding Scale so the range can be placed on the medications.   HGA1C done 11/01/2015 on chart.   EKG-8.21.16- EPIC

## 2015-11-28 ENCOUNTER — Telehealth: Payer: Self-pay | Admitting: Family Medicine

## 2015-11-28 NOTE — Telephone Encounter (Signed)
  Request for surgical clearance received from [facility] for L and R percutaneous nephrolithotomy on 12/03/15 and 12/24/15. Letter completed to fax 11/28/15 with information below.   History of anemia - most recent HGB noted yesterday at 13. Perioperative CBC assessment recommended.   History of severe COPD - seen by pulmonary in 2013, no recent pulmonary evaluation noted. Not on inhalers at last visit. No recent CXR. Recommend CXR and perioperative inhalers may be needed,and may be more difficult extubation based on this history.   History of diabetes - insulin dependent. Prior uncontrolled, but reportedly under improved control under care of endocrinology. Recent glucose reading 352 on 11/05/15 and 265 yesterday on BMP indicating not controlled. Recommend perioperative glycemic monitoring and control, and can discuss perioperative control with his endocrinologist if needed.   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  Essentially normal echo in 12/2012.  Dyspnea thought to be due to obesity and deconditioning. Last eval with cardiologist 01/06/14. With history of CAD and last eval in 2015 - will ask cardiology to determine if cleared from cardiac standpoint for above surgery.   Noted he is taking aspirin 81mg  QD. Called patient to advise him to stop as of today, but he has not been taking this since last week in anticipation of upcoming surgery.   Will route this note to Dr. Burt Knack regarding cardiac clearance for percutaneous nephrolithotomy, but other medical issues and recommendations as listed above.  Letter printed to fax with cover letter/request from [facility]. Will fax to them tonight.

## 2015-11-28 NOTE — Telephone Encounter (Signed)
I can set up a PA/NP visit for cardiac clearance. Otherwise difficult for me to comment on clearance since I haven't seen him in 2 years. I reviewed his chart and he had a normal nuclear scan at the time of initial evaluation. If he isn't having cardiac symptoms it is unlikely that we will do any further testing or add much from a perspective of risk stratification. If you would like me to arrange a visit for clearance please let me know.   thx Ronalee Belts

## 2015-11-28 NOTE — Telephone Encounter (Deleted)
Request for surgical clearance received from [facility] for L and R percutaneous nephrolithotomy on 12/03/15 and 12/24/15.    History of anemia - most recent HGB noted yesterday at 13. Perioperative CBC assessment recommended.   History of severe COPD - seen by pulmonary in 2013, no recent pulmonary evaluation noted.  Not on inhalers at last visit.  No recent CXR. Recommend CXR and perioperative inhalers may be needed,and may be more difficult extubation based on this history.   History of diabetes - insulin dependent. Prior uncontrolled, but reportedly under improved control under care of endocrinology. Recent glucose reading 352 on 11/05/15 and 265 yesterday on BMP. Recommend perioperative glycemic monitoring and control, and can discuss perioperative control with his endocrinologist if needed.   iso

## 2015-11-29 ENCOUNTER — Telehealth: Payer: Self-pay | Admitting: Cardiovascular Disease

## 2015-11-29 NOTE — Telephone Encounter (Signed)
Called Dr. Antionette Char office to see if patient could be seen today or tomorrow for clearance since appt is scheduled for Monday. They are not able to see him until Monday at 2:30pm and stated he would just need to push his surgery out. [place] [facility] and left detailed voice mail with OR scheduler regarding all this and that he would need to cxl surgery on Monday.

## 2015-11-29 NOTE — Progress Notes (Signed)
[facility], [place] placed on chart.

## 2015-11-29 NOTE — Telephone Encounter (Signed)
Will forward to Dr Burt Knack and Theodosia Quay

## 2015-11-29 NOTE — Telephone Encounter (Signed)
  Called patient and explained to him Dr. Antionette Char office could not see  Patient  today or tomorrow for clearance, they would be able to on Monday so they scheduled him an appointment for 2:30pm.  They also stated he would just need to push his surgery out. [facility] and left detailed voice mail with OR scheduler regarding all this and that he would need to cxl surgery on Monday.   Patient and spouse were not happy regarding this. They said he doesn't have a heart problem and never has had a heart problem. They asked if there was a form/paper they could sign so he could still get surgery on Monday with out having to see cardiologist. I responded that they would need to call and speak with someone from urology office to see if there were any other options he could do so he could still have surgery. I reviewed chart with patient explaining where/why this was needed. Also explained hx of CAD in Dr. Vonna Kotyk documentation in a previous message. They kept going on and on about who/why this is happening, it's all about money, all about everyone covering their own ass's, he is just going to have to suffer and making him suffer, why is it on the 11th hour this is being done. I tried to take the time and go over in detail to help them better understand where/why this is. During the 11 minute conversation they kept on and on with the above details and more, not in a nice or polite voice or way. At that point I realized nothing I could say, explain, in a nice and polite tone was going to help the situation and there was nothing else left for me to discussion or continue so I said I will have Dr. Carlota Raspberry give you a call, thank you and hung up.          OR scheduler called me back earlier this afternoon and stated she would take him off the schedule for Monday. She would try and see if the surgical appointment that is scheduled a couple weeks after initial suargery for other side, could be changed to the  first surgical procedure.

## 2015-11-29 NOTE — Telephone Encounter (Signed)
Call cardiology - are they able to work him in tomorrow?  Scheduled for surgery Monday, so if not able to be seen before then, would need to contact his urologist and let him know about information below. Let me know what I need to do.

## 2015-11-29 NOTE — Telephone Encounter (Signed)
Request for surgical clearance:  1. What type of surgery is being performed? Percutaneous Nephrolithotomy   2. When is this surgery scheduled? 12-03-15 3. Are there any medications that need to be held prior to surgery and how long?Cardiac clearance-Is there any risk for the procedure -Need this back asap  4. Name of physician performing surgery? Dr [name]   5. What is your office phone and fax number? [phone] Fax#[phone]  6.

## 2015-11-30 MED FILL — oxyCODONE HCL 15 MG TABS: 15 | 7 days supply | Qty: 30 | Fill #0

## 2015-11-30 NOTE — Telephone Encounter (Signed)
Call placed to urologist, currently plan appears to be rescheduled surgery for April 10. May be trying to see if this can be done sooner. Cardiology note reviewed as below, recommended evaluation at their office, which will be done this Monday. Unfortunately with his history of suspected non-obstructive coronary disease, previous uncontrolled diabetes, does have increased cardiac risk factors. Stress test was in 2013. I would still recommend he be evaluated by cardiology to determine if any other testing needed prior to surgery. As it stands, he has an appointment this upcoming Monday. When I called [name], he did not have any specific questions for me at this time, and stated that everything was taking care of. Advised him to let me know if there is anything I can do in the meantime. Understanding expressed.

## 2015-11-30 NOTE — Telephone Encounter (Signed)
Please see previous Epic note. I haven't seen the patient in 2 years so he will need a preop visit if cardiac clearance required.

## 2015-11-30 NOTE — Telephone Encounter (Signed)
The pt is scheduled for cardiac clearance evaluation on 12/03/15 with Truitt Merle NP. I have left a message on Selita's voicemail with this information. I left the pt a voicemail in regards to the scheduled appointment on 12/03/15.

## 2015-12-03 ENCOUNTER — Ambulatory Visit (HOSPITAL_COMMUNITY): Payer: Medicare Other

## 2015-12-03 ENCOUNTER — Other Ambulatory Visit: Payer: Self-pay

## 2015-12-03 ENCOUNTER — Encounter (HOSPITAL_COMMUNITY): Admission: RE | Payer: Self-pay | Source: Ambulatory Visit

## 2015-12-03 ENCOUNTER — Encounter: Payer: Self-pay | Admitting: Nurse Practitioner

## 2015-12-03 ENCOUNTER — Telehealth (HOSPITAL_COMMUNITY): Payer: Self-pay | Admitting: *Deleted

## 2015-12-03 ENCOUNTER — Ambulatory Visit (HOSPITAL_COMMUNITY): Admission: RE | Admit: 2015-12-03 | Payer: Medicare Other | Source: Ambulatory Visit | Admitting: Urology

## 2015-12-03 ENCOUNTER — Ambulatory Visit (INDEPENDENT_AMBULATORY_CARE_PROVIDER_SITE_OTHER): Payer: Medicare Other | Admitting: Nurse Practitioner

## 2015-12-03 ENCOUNTER — Other Ambulatory Visit (HOSPITAL_COMMUNITY): Payer: Medicare Other

## 2015-12-03 VITALS — BP 122/62 | HR 98 | Ht 73.0 in | Wt 264.0 lb

## 2015-12-03 DIAGNOSIS — I259 Chronic ischemic heart disease, unspecified: Secondary | ICD-10-CM

## 2015-12-03 DIAGNOSIS — Z01810 Encounter for preprocedural cardiovascular examination: Secondary | ICD-10-CM

## 2015-12-03 DIAGNOSIS — R9431 Abnormal electrocardiogram [ECG] [EKG]: Secondary | ICD-10-CM | POA: Diagnosis not present

## 2015-12-03 SURGERY — NEPHROLITHOTOMY PERCUTANEOUS
Anesthesia: General | Laterality: Left

## 2015-12-03 NOTE — Telephone Encounter (Signed)
I just saw that pt has appt sched for 3/22.

## 2015-12-03 NOTE — Telephone Encounter (Signed)
Patient given detailed instructions per Myocardial Perfusion Study Information Sheet for the test on 12/04/15 at 0730. Patient notified to arrive 15 minutes early and that it is imperative to arrive on time for appointment to keep from having the test rescheduled.  If you need to cancel or reschedule your appointment, please call the office within 24 hours of your appointment. Failure to do so may result in a cancellation of your appointment, and a $50 no show fee. Patient verbalized understanding.Annalee Meyerhoff, Ranae [place]

## 2015-12-03 NOTE — Patient Instructions (Addendum)
We will be checking the following labs today - NONE   Medication Instructions:    Continue with your current medicines.     Testing/Procedures To Be Arranged:  Lexiscan Myoview  Follow-Up:   To be determined   Other Special Instructions:   N/A    If you need a refill on your cardiac medications before your next appointment, please call your pharmacy.   Call the [place] office at [phone] if you have any questions, problems or concerns.
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
## 2015-12-03 NOTE — Telephone Encounter (Signed)
Pharm faxed req for RFs of vit D. Dr Carlota Raspberry, pt was just here for check up in Feb, but I don't see any vit D labs since 2014. Do you want to OK RFs, or have pt come back for labs/OV?

## 2015-12-04 ENCOUNTER — Ambulatory Visit (HOSPITAL_COMMUNITY): Payer: Medicare Other | Attending: Cardiovascular Disease

## 2015-12-04 ENCOUNTER — Encounter: Payer: Medicare Other | Admitting: Pain Medicine

## 2015-12-04 DIAGNOSIS — R0609 Other forms of dyspnea: Secondary | ICD-10-CM | POA: Insufficient documentation

## 2015-12-04 DIAGNOSIS — R9431 Abnormal electrocardiogram [ECG] [EKG]: Secondary | ICD-10-CM | POA: Diagnosis not present

## 2015-12-04 DIAGNOSIS — E119 Type 2 diabetes mellitus without complications: Secondary | ICD-10-CM | POA: Insufficient documentation

## 2015-12-04 DIAGNOSIS — I259 Chronic ischemic heart disease, unspecified: Secondary | ICD-10-CM | POA: Insufficient documentation

## 2015-12-04 DIAGNOSIS — Z01810 Encounter for preprocedural cardiovascular examination: Secondary | ICD-10-CM | POA: Diagnosis not present

## 2015-12-04 DIAGNOSIS — I10 Essential (primary) hypertension: Secondary | ICD-10-CM | POA: Diagnosis not present

## 2015-12-04 LAB — MYOCARDIAL PERFUSION IMAGING
LV dias vol: 102 mL (ref 62–150)
LV sys vol: 37 mL
Peak HR: 100 {beats}/min
RATE: 0.36
Rest HR: 86 {beats}/min
SDS: 1
SRS: 6
SSS: 7
TID: 1.18

## 2015-12-04 MED ORDER — REGADENOSON 0.4 MG/5ML IV SOLN
0.4000 mg | Freq: Once | INTRAVENOUS | Status: AC
  Administered 2015-12-04: 0.4 mg via INTRAVENOUS

## 2015-12-04 MED ORDER — TECHNETIUM TC 99M SESTAMIBI GENERIC - CARDIOLITE
10.3000 | Freq: Once | INTRAVENOUS | Status: AC | PRN
  Administered 2015-12-04: 10 via INTRAVENOUS

## 2015-12-04 MED ORDER — TECHNETIUM TC 99M SESTAMIBI GENERIC - CARDIOLITE
33.0000 | Freq: Once | INTRAVENOUS | Status: AC | PRN
  Administered 2015-12-04: 33 via INTRAVENOUS

## 2015-12-05 ENCOUNTER — Emergency Department (HOSPITAL_COMMUNITY): Payer: Medicare Other

## 2015-12-05 ENCOUNTER — Encounter: Payer: Self-pay | Admitting: Family Medicine

## 2015-12-05 ENCOUNTER — Observation Stay (HOSPITAL_COMMUNITY)
Admission: EM | Admit: 2015-12-05 | Discharge: 2015-12-07 | Disposition: A | Discharge status: Home / Self Care | Payer: Medicare Other | Attending: Internal Medicine | Admitting: Internal Medicine

## 2015-12-05 ENCOUNTER — Encounter (HOSPITAL_COMMUNITY): Payer: Self-pay | Admitting: Emergency Medicine

## 2015-12-05 ENCOUNTER — Observation Stay (HOSPITAL_COMMUNITY): Payer: Medicare Other

## 2015-12-05 ENCOUNTER — Other Ambulatory Visit: Payer: Self-pay | Admitting: Urology

## 2015-12-05 ENCOUNTER — Encounter (HOSPITAL_COMMUNITY): Payer: Medicare Other

## 2015-12-05 ENCOUNTER — Ambulatory Visit (INDEPENDENT_AMBULATORY_CARE_PROVIDER_SITE_OTHER): Payer: Medicare Other | Admitting: Family Medicine

## 2015-12-05 ENCOUNTER — Other Ambulatory Visit: Payer: Self-pay

## 2015-12-05 VITALS — BP 128/70 | HR 104 | Temp 99.7°F | Resp 16 | Ht 73.0 in | Wt 280.0 lb

## 2015-12-05 DIAGNOSIS — Z0000 Encounter for general adult medical examination without abnormal findings: Secondary | ICD-10-CM

## 2015-12-05 DIAGNOSIS — H5360 Unspecified night blindness: Secondary | ICD-10-CM | POA: Diagnosis present

## 2015-12-05 DIAGNOSIS — J209 Acute bronchitis, unspecified: Secondary | ICD-10-CM | POA: Diagnosis present

## 2015-12-05 DIAGNOSIS — Z794 Long term (current) use of insulin: Secondary | ICD-10-CM | POA: Diagnosis not present

## 2015-12-05 DIAGNOSIS — E210 Primary hyperparathyroidism: Secondary | ICD-10-CM | POA: Insufficient documentation

## 2015-12-05 DIAGNOSIS — G4733 Obstructive sleep apnea (adult) (pediatric): Secondary | ICD-10-CM | POA: Insufficient documentation

## 2015-12-05 DIAGNOSIS — Z7982 Long term (current) use of aspirin: Secondary | ICD-10-CM | POA: Diagnosis not present

## 2015-12-05 DIAGNOSIS — E669 Obesity, unspecified: Secondary | ICD-10-CM | POA: Insufficient documentation

## 2015-12-05 DIAGNOSIS — K449 Diaphragmatic hernia without obstruction or gangrene: Secondary | ICD-10-CM

## 2015-12-05 DIAGNOSIS — K219 Gastro-esophageal reflux disease without esophagitis: Secondary | ICD-10-CM | POA: Diagnosis not present

## 2015-12-05 DIAGNOSIS — I639 Cerebral infarction, unspecified: Secondary | ICD-10-CM | POA: Diagnosis not present

## 2015-12-05 DIAGNOSIS — G894 Chronic pain syndrome: Secondary | ICD-10-CM

## 2015-12-05 DIAGNOSIS — R410 Disorientation, unspecified: Secondary | ICD-10-CM

## 2015-12-05 DIAGNOSIS — F32A Depression, unspecified: Secondary | ICD-10-CM

## 2015-12-05 DIAGNOSIS — N200 Calculus of kidney: Secondary | ICD-10-CM

## 2015-12-05 DIAGNOSIS — R51 Headache: Secondary | ICD-10-CM

## 2015-12-05 DIAGNOSIS — I2510 Atherosclerotic heart disease of native coronary artery without angina pectoris: Secondary | ICD-10-CM | POA: Diagnosis not present

## 2015-12-05 DIAGNOSIS — G4700 Insomnia, unspecified: Secondary | ICD-10-CM

## 2015-12-05 DIAGNOSIS — Z79899 Other long term (current) drug therapy: Secondary | ICD-10-CM | POA: Insufficient documentation

## 2015-12-05 DIAGNOSIS — F329 Major depressive disorder, single episode, unspecified: Secondary | ICD-10-CM | POA: Diagnosis not present

## 2015-12-05 DIAGNOSIS — E1169 Type 2 diabetes mellitus with other specified complication: Secondary | ICD-10-CM | POA: Diagnosis present

## 2015-12-05 DIAGNOSIS — F05 Delirium due to known physiological condition: Secondary | ICD-10-CM | POA: Diagnosis not present

## 2015-12-05 DIAGNOSIS — R112 Nausea with vomiting, unspecified: Secondary | ICD-10-CM | POA: Diagnosis not present

## 2015-12-05 DIAGNOSIS — R531 Weakness: Secondary | ICD-10-CM | POA: Diagnosis not present

## 2015-12-05 DIAGNOSIS — E7800 Pure hypercholesterolemia, unspecified: Secondary | ICD-10-CM

## 2015-12-05 DIAGNOSIS — M5137 Other intervertebral disc degeneration, lumbosacral region: Secondary | ICD-10-CM | POA: Diagnosis not present

## 2015-12-05 DIAGNOSIS — R61 Generalized hyperhidrosis: Secondary | ICD-10-CM

## 2015-12-05 DIAGNOSIS — I10 Essential (primary) hypertension: Secondary | ICD-10-CM

## 2015-12-05 DIAGNOSIS — R42 Dizziness and giddiness: Secondary | ICD-10-CM | POA: Diagnosis not present

## 2015-12-05 DIAGNOSIS — Z87891 Personal history of nicotine dependence: Secondary | ICD-10-CM | POA: Diagnosis not present

## 2015-12-05 DIAGNOSIS — G8929 Other chronic pain: Secondary | ICD-10-CM | POA: Diagnosis not present

## 2015-12-05 DIAGNOSIS — E1165 Type 2 diabetes mellitus with hyperglycemia: Secondary | ICD-10-CM | POA: Diagnosis not present

## 2015-12-05 DIAGNOSIS — H1033 Unspecified acute conjunctivitis, bilateral: Secondary | ICD-10-CM | POA: Diagnosis not present

## 2015-12-05 DIAGNOSIS — J449 Chronic obstructive pulmonary disease, unspecified: Secondary | ICD-10-CM | POA: Insufficient documentation

## 2015-12-05 DIAGNOSIS — H9209 Otalgia, unspecified ear: Secondary | ICD-10-CM | POA: Diagnosis not present

## 2015-12-05 DIAGNOSIS — E785 Hyperlipidemia, unspecified: Secondary | ICD-10-CM | POA: Insufficient documentation

## 2015-12-05 DIAGNOSIS — H5461 Unqualified visual loss, right eye, normal vision left eye: Secondary | ICD-10-CM | POA: Diagnosis not present

## 2015-12-05 DIAGNOSIS — G459 Transient cerebral ischemic attack, unspecified: Secondary | ICD-10-CM | POA: Diagnosis not present

## 2015-12-05 DIAGNOSIS — M549 Dorsalgia, unspecified: Secondary | ICD-10-CM | POA: Diagnosis not present

## 2015-12-05 DIAGNOSIS — M25512 Pain in left shoulder: Secondary | ICD-10-CM | POA: Insufficient documentation

## 2015-12-05 DIAGNOSIS — M6289 Other specified disorders of muscle: Secondary | ICD-10-CM | POA: Diagnosis not present

## 2015-12-05 DIAGNOSIS — R4781 Slurred speech: Secondary | ICD-10-CM | POA: Diagnosis not present

## 2015-12-05 DIAGNOSIS — N390 Urinary tract infection, site not specified: Secondary | ICD-10-CM | POA: Diagnosis not present

## 2015-12-05 DIAGNOSIS — M5136 Other intervertebral disc degeneration, lumbar region: Secondary | ICD-10-CM

## 2015-12-05 DIAGNOSIS — R2681 Unsteadiness on feet: Secondary | ICD-10-CM

## 2015-12-05 DIAGNOSIS — M25511 Pain in right shoulder: Secondary | ICD-10-CM | POA: Insufficient documentation

## 2015-12-05 DIAGNOSIS — E11319 Type 2 diabetes mellitus with unspecified diabetic retinopathy without macular edema: Secondary | ICD-10-CM | POA: Insufficient documentation

## 2015-12-05 DIAGNOSIS — I259 Chronic ischemic heart disease, unspecified: Secondary | ICD-10-CM

## 2015-12-05 DIAGNOSIS — R404 Transient alteration of awareness: Secondary | ICD-10-CM | POA: Diagnosis not present

## 2015-12-05 DIAGNOSIS — R509 Fever, unspecified: Secondary | ICD-10-CM

## 2015-12-05 DIAGNOSIS — R29898 Other symptoms and signs involving the musculoskeletal system: Secondary | ICD-10-CM | POA: Diagnosis not present

## 2015-12-05 DIAGNOSIS — R413 Other amnesia: Secondary | ICD-10-CM | POA: Diagnosis not present

## 2015-12-05 DIAGNOSIS — E1151 Type 2 diabetes mellitus with diabetic peripheral angiopathy without gangrene: Secondary | ICD-10-CM | POA: Insufficient documentation

## 2015-12-05 DIAGNOSIS — R519 Headache, unspecified: Secondary | ICD-10-CM

## 2015-12-05 DIAGNOSIS — Z87442 Personal history of urinary calculi: Secondary | ICD-10-CM | POA: Insufficient documentation

## 2015-12-05 DIAGNOSIS — M25519 Pain in unspecified shoulder: Secondary | ICD-10-CM | POA: Diagnosis present

## 2015-12-05 HISTORY — PX: TRANSTHORACIC ECHOCARDIOGRAM: SHX275

## 2015-12-05 LAB — URINE MICROSCOPIC-ADD ON

## 2015-12-05 LAB — URINALYSIS, ROUTINE W REFLEX MICROSCOPIC
Bilirubin Urine: NEGATIVE
Glucose, UA: 250 mg/dL — AB
Ketones, ur: NEGATIVE mg/dL
Nitrite: NEGATIVE
Protein, ur: 30 mg/dL — AB
Specific Gravity, Urine: 1.019 (ref 1.005–1.030)
pH: 5.5 (ref 5.0–8.0)

## 2015-12-05 LAB — BASIC METABOLIC PANEL
Anion gap: 8 (ref 5–15)
BUN: 6 mg/dL (ref 6–20)
CO2: 24 mmol/L (ref 22–32)
Calcium: 9.9 mg/dL (ref 8.9–10.3)
Chloride: 103 mmol/L (ref 101–111)
Creatinine, Ser: 0.88 mg/dL (ref 0.61–1.24)
GFR calc Af Amer: >60 mL/min (ref >60)
GFR calc non Af Amer: >60 mL/min (ref >60)
Glucose, Bld: 78 mg/dL (ref 65–99)
Potassium: 3.5 mmol/L (ref 3.5–5.1)
Sodium: 135 mmol/L (ref 135–145)

## 2015-12-05 LAB — CBC WITH DIFFERENTIAL/PLATELET
Basophils Absolute: 0 10*3/uL (ref 0.0–0.1)
Basophils Relative: 0 %
Eosinophils Absolute: 0.1 10*3/uL (ref 0.0–0.7)
Eosinophils Relative: 1 %
HCT: 35.5 % — ABNORMAL LOW (ref 39.0–52.0)
Hemoglobin: 12 g/dL — ABNORMAL LOW (ref 13.0–17.0)
Lymphocytes Relative: 47 %
Lymphs Abs: 3.7 10*3/uL (ref 0.7–4.0)
MCH: 34.2 pg — ABNORMAL HIGH (ref 26.0–34.0)
MCHC: 33.8 g/dL (ref 30.0–36.0)
MCV: 101.1 fL — ABNORMAL HIGH (ref 78.0–100.0)
Monocytes Absolute: 0.9 10*3/uL (ref 0.1–1.0)
Monocytes Relative: 11 %
Neutro Abs: 3.3 10*3/uL (ref 1.7–7.7)
Neutrophils Relative %: 41 %
Platelets: 270 10*3/uL (ref 150–400)
RBC: 3.51 MIL/uL — ABNORMAL LOW (ref 4.22–5.81)
RDW: 15.8 % — ABNORMAL HIGH (ref 11.5–15.5)
WBC: 8 10*3/uL (ref 4.0–10.5)

## 2015-12-05 LAB — PROTIME-INR
INR: 1.2 (ref 0.00–1.49)
PROTHROMBIN TIME: 15.4 s — AB (ref 11.6–15.2)

## 2015-12-05 LAB — APTT: aPTT: 31 seconds (ref 24–37)

## 2015-12-05 LAB — MAGNESIUM: Magnesium: 1.9 mg/dL (ref 1.7–2.4)

## 2015-12-05 LAB — GLUCOSE, POCT (MANUAL RESULT ENTRY): POC GLUCOSE: 85 mg/dL (ref 70–99)

## 2015-12-05 MED ORDER — ASPIRIN 325 MG PO TABS
325.0000 mg | ORAL_TABLET | Freq: Every day | ORAL | Status: DC
  Administered 2015-12-06 – 2015-12-07 (×3): 325 mg via ORAL
  Filled 2015-12-05 (×3): qty 1

## 2015-12-05 MED ORDER — LORAZEPAM 1 MG PO TABS: 0.5000 mg | ORAL_TABLET | Freq: Every evening | ORAL | Status: DC | PRN

## 2015-12-05 MED ORDER — DOCUSATE SODIUM 100 MG PO CAPS
200.0000 mg | ORAL_CAPSULE | Freq: Every day | ORAL | Status: DC
  Administered 2015-12-06 – 2015-12-07 (×2): 200 mg via ORAL
  Filled 2015-12-05 (×2): qty 2

## 2015-12-05 MED ORDER — ENOXAPARIN SODIUM 40 MG/0.4ML ~~LOC~~ SOLN
40.0000 mg | Freq: Every day | SUBCUTANEOUS | Status: DC
  Administered 2015-12-06 – 2015-12-07 (×2): 40 mg via SUBCUTANEOUS
  Filled 2015-12-05 (×2): qty 0.4

## 2015-12-05 MED ORDER — SENNOSIDES-DOCUSATE SODIUM 8.6-50 MG PO TABS: 1.0000 | ORAL_TABLET | Freq: Every evening | ORAL | Status: DC | PRN

## 2015-12-05 MED ORDER — OXYCODONE-ACETAMINOPHEN 5-325 MG PO TABS
2.0000 | ORAL_TABLET | Freq: Once | ORAL | Status: AC
  Administered 2015-12-05: 2 via ORAL
  Filled 2015-12-05: qty 2

## 2015-12-05 MED ORDER — PANTOPRAZOLE SODIUM 40 MG PO TBEC
40.0000 mg | DELAYED_RELEASE_TABLET | Freq: Every day | ORAL | Status: DC
  Administered 2015-12-06 – 2015-12-07 (×3): 40 mg via ORAL
  Filled 2015-12-05 (×3): qty 1

## 2015-12-05 MED ORDER — TRAZODONE HCL 100 MG PO TABS
100.0000 mg | ORAL_TABLET | Freq: Every day | ORAL | Status: DC
  Administered 2015-12-06 (×2): 100 mg via ORAL
  Filled 2015-12-05 (×2): qty 1

## 2015-12-05 MED ORDER — NYSTATIN 100000 UNIT/GM EX POWD: 1.0000 g | Freq: Every day | CUTANEOUS | Status: DC | PRN

## 2015-12-05 MED ORDER — ASPIRIN 300 MG RE SUPP: 300.0000 mg | Freq: Every day | RECTAL | Status: DC

## 2015-12-05 MED ORDER — SIMVASTATIN 40 MG PO TABS
40.0000 mg | ORAL_TABLET | Freq: Every day | ORAL | Status: DC
  Administered 2015-12-06 (×2): 40 mg via ORAL
  Filled 2015-12-05 (×2): qty 1

## 2015-12-05 MED ORDER — STROKE: EARLY STAGES OF RECOVERY BOOK
Freq: Once | Status: AC
  Administered 2015-12-06: 10:00:00
  Filled 2015-12-05: qty 1

## 2015-12-05 MED ORDER — INSULIN ASPART 100 UNIT/ML ~~LOC~~ SOLN
0.0000 [IU] | Freq: Every day | SUBCUTANEOUS | Status: DC
  Administered 2015-12-06: 0 [IU] via SUBCUTANEOUS

## 2015-12-05 MED ORDER — INSULIN ASPART 100 UNIT/ML ~~LOC~~ SOLN: 0.0000 [IU] | Freq: Three times a day (TID) | SUBCUTANEOUS | Status: DC

## 2015-12-05 MED ORDER — SODIUM CHLORIDE 0.9 % IV SOLN
INTRAVENOUS | Status: DC
  Administered 2015-12-06 – 2015-12-07 (×2): via INTRAVENOUS

## 2015-12-05 MED ORDER — PREGABALIN 75 MG PO CAPS
100.0000 mg | ORAL_CAPSULE | Freq: Three times a day (TID) | ORAL | Status: DC
  Administered 2015-12-06 – 2015-12-07 (×5): 100 mg via ORAL
  Filled 2015-12-05 (×5): qty 1

## 2015-12-05 MED ORDER — OXYCODONE HCL 5 MG PO TABS
5.0000 mg | ORAL_TABLET | ORAL | Status: DC | PRN
  Filled 2015-12-05: qty 1

## 2015-12-05 MED ORDER — ZOLPIDEM TARTRATE 5 MG PO TABS: 10.0000 mg | ORAL_TABLET | Freq: Every evening | ORAL | Status: DC | PRN

## 2015-12-05 NOTE — ED Notes (Signed)
This RN attempted IV x 2 without any success

## 2015-12-05 NOTE — ED Notes (Signed)
MD made aware of patient's request for home pain medication

## 2015-12-05 NOTE — ED Notes (Signed)
Pt provided with urinal and made aware of need for urine sample
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
## 2015-12-05 NOTE — Patient Instructions (Addendum)
For insomnia - see info below.  Try lower dose of Ambien and only if needed. Do not combine with ativan, and use caution combining these medicines with other sedating medicines.   Consider an ace inhibitor for blood pressure instead of the furosemide - you can discuss this with urologist and diabetes specialist.  This group of medicines can be helpful in diabetes to help protect kidneys.   Based on symptoms of headache, intermittent confusion and R arm weakness since yesterday, will need to have you evaluated in ER today. Can finish physical and address other medications over phone or follow up after ER visit.   IF you received an x-ray today, you will receive an invoice from [facility]. Please contact [facility] at [phone] with questions or concerns regarding your invoice.   IF you received labwork today, you will receive an invoice from [employer]. Please contact [facility] at [phone] with questions or concerns regarding your invoice.   Our billing staff will not be able to assist you with questions regarding bills from these companies.  You will be contacted with the lab results as soon as they are available. The fastest way to get your results is to activate your My Chart account. Instructions are located on the last page of this paperwork. If you have not heard from [place] regarding the results in 2 weeks, please contact this office.
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
## 2015-12-05 NOTE — ED Notes (Signed)
Spoke to patient's wife who would like patient to continue using his home insulin while in the hospital.  States patient has been doing well on Humilin Pen.  Spoke to Dr. Blaine Hamper who states okay to do.   CBGs will be checked by hospital staff, and then Humilin dose will be confirmed with RN and patient can administer home insulin.  Rich on 47M made aware

## 2015-12-05 NOTE — ED Notes (Signed)
Attempted report x1. 

## 2015-12-05 NOTE — ED Notes (Signed)
MD at bedside updating family.  
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
## 2015-12-05 NOTE — ED Notes (Signed)
Had appointment today with dr Nyoka Cowden and told them that yesterday after hjis stress test and he went home he had weakness and h/a rt side and some intermittent confusion states s/s or nh/a and weakness have also been intermittent since yesterday the dr called ems tioday

## 2015-12-05 NOTE — ED Notes (Signed)
Pt states he feels like he's lightheaded on the right side of his head. Denies pain or any weakness at this time. Pt states he has had "episodes" of weakness x2 days since he had his stress test.

## 2015-12-05 NOTE — Progress Notes (Signed)
Pt got here from ED with stroke symptoms, alert and oriented but upset, verbally saying that he is tired of people telling him what to do, asking his wife to take him home, pt wants to use his own insulin from home, RN tried to explain the policy to pt that any medication brought from home has to taken to the pharmacy and verified before we can administer it, but pt did not want to hear it, his wife requested that i leave the room so that she can talk to him, so i left the room. Pt's wife walked to the nurses station after a few minutes requesting for a different RN for the night, Charge RN notified and a different RN was assigned to pt. [place]-Asidi, Lindsy Cerullo Efe

## 2015-12-06 ENCOUNTER — Observation Stay (HOSPITAL_BASED_OUTPATIENT_CLINIC_OR_DEPARTMENT_OTHER): Payer: Medicare Other

## 2015-12-06 DIAGNOSIS — G459 Transient cerebral ischemic attack, unspecified: Secondary | ICD-10-CM | POA: Diagnosis not present

## 2015-12-06 DIAGNOSIS — M6289 Other specified disorders of muscle: Secondary | ICD-10-CM

## 2015-12-06 DIAGNOSIS — I639 Cerebral infarction, unspecified: Secondary | ICD-10-CM | POA: Diagnosis not present

## 2015-12-06 LAB — GLUCOSE, CAPILLARY
GLUCOSE-CAPILLARY: 106 mg/dL — AB (ref 65–99)
GLUCOSE-CAPILLARY: 107 mg/dL — AB (ref 65–99)
GLUCOSE-CAPILLARY: 164 mg/dL — AB (ref 65–99)
GLUCOSE-CAPILLARY: 192 mg/dL — AB (ref 65–99)
GLUCOSE-CAPILLARY: 247 mg/dL — AB (ref 65–99)
Glucose-Capillary: 79 mg/dL (ref 65–99)

## 2015-12-06 LAB — RAPID URINE DRUG SCREEN, HOSP PERFORMED
Amphetamines: NOT DETECTED
BARBITURATES: NOT DETECTED
BENZODIAZEPINES: NOT DETECTED
COCAINE: NOT DETECTED
OPIATES: POSITIVE — AB
TETRAHYDROCANNABINOL: NOT DETECTED

## 2015-12-06 LAB — LIPID PANEL
CHOL/HDL RATIO: 5.2 ratio
CHOLESTEROL: 103 mg/dL (ref 0–200)
HDL: 20 mg/dL — ABNORMAL LOW (ref >40)
LDL CALC: 50 mg/dL (ref 0–99)
Triglycerides: 167 mg/dL — ABNORMAL HIGH (ref <150)
VLDL: 33 mg/dL (ref 0–40)

## 2015-12-06 MED ORDER — OXYCODONE HCL 5 MG PO TABS
15.0000 mg | ORAL_TABLET | ORAL | Status: DC | PRN
  Administered 2015-12-06 – 2015-12-07 (×7): 15 mg via ORAL
  Filled 2015-12-06 (×7): qty 3

## 2015-12-06 MED ORDER — DEXTROSE 5 % IV SOLN
2.0000 g | INTRAVENOUS | Status: DC
  Administered 2015-12-06: 2 g via INTRAVENOUS
  Filled 2015-12-06 (×2): qty 2

## 2015-12-06 MED ORDER — POLYVINYL ALCOHOL 1.4 % OP SOLN
1.0000 [drp] | OPHTHALMIC | Status: DC | PRN
  Filled 2015-12-06 (×2): qty 15

## 2015-12-06 NOTE — Evaluation (Signed)
Physical Therapy Evaluation and Discharge  Patient Details Name: [name] MRN: [id number] DOB: February 21, 1951 Today's Date: 12/06/2015   History of Present Illness  65 y.o. male history diabetes mellitus, hypertension, hyperlipidemia and peripheral vascular disease, presenting with new onset weakness involving his right side with slurred speech as well as intermittent loss of vision involving his right eye. Is also experiencing numbness involving his right upper extremity distally. CT scan of his head showed no acute intracranial abnormality.     Clinical Impression  Patient evaluated by Physical Therapy with no further PT needs identified. Patient/wife had questions re: shower transfer and problem-solved with them. Encouraged [facility] therapy evaluation to better assess the situation and make recommendations, however wife and pt refused HH. Otherwise, all education has been completed and the patient has no further questions. They report he is at his baseline. PT is signing off. Thank you for this referral.     Follow Up Recommendations No PT follow up;Supervision for mobility/OOB    Equipment Recommendations  None recommended by PT    Recommendations for Other Services       Precautions / Restrictions Precautions Precautions: None Required Braces or Orthoses: Other Brace/Splint Other Brace/Splint: R BKA prosthesis Restrictions Weight Bearing Restrictions: No      Mobility  Bed Mobility               General bed mobility comments: Pt OOB in chair upon arrival.  Transfers Overall transfer level: Needs assistance Equipment used: Rolling walker (2 wheeled) Transfers: Sit to/from Stand Sit to Stand: Min guard         General transfer comment: stood from chair with armrests (impulsively while PT preparing RW--wife reports this is typical for pt); on return to sit, pt "parked" RW off to the side as he reached for armrest (feet 18" from chair) and pivoted to  sit.  Ambulation/Gait Ambulation/Gait assistance: Min guard Ambulation Distance (Feet): 25 Feet Assistive device: Rolling walker (2 wheeled) (Rt prosthesis) Gait Pattern/deviations: Step-through pattern;Decreased stride length;Shuffle;Trunk flexed;Wide base of support Gait velocity: very slow   General Gait Details: pt pushes RW so far ahead that his feet are outside the RW (pt/wife reports this is safest for him as he otherwise catches his feet/legs on back legs of RW (due to wide BOS and toe out posture). As pt turns, his feet are outside the base of support of RW and discussed safety issues with this technique.   Stairs            Wheelchair Mobility    Modified Rankin (Stroke Patients Only)       Balance Overall balance assessment: Needs assistance Sitting-balance support: Feet supported;No upper extremity supported Sitting balance-Leahy Scale: Fair     Standing balance support: Bilateral upper extremity supported Standing balance-Leahy Scale: Poor (reaching for UE support due to unsteadiness)                               Pertinent Vitals/Pain Pain Assessment: No/denies pain    Home Living Family/patient expects to be discharged to:: Private residence Living Arrangements: Spouse/significant other Available Help at Discharge: Family;Available 24 hours/day Type of Home: House Home Access: Stairs to enter Entrance Stairs-Rails: Right Entrance Stairs-Number of Steps: 3 Home Layout: One level Home Equipment: Walker - 2 wheels;Cane - single point;Grab bars - tub/shower;Hand held shower head;Adaptive equipment;Wheelchair - [employer];Tub bench;Electric scooter (uses drop-arm BSC in shower)      Prior  Function Level of Independence: Needs assistance   Gait / Transfers Assistance Needed: Used w/c for community mobility; RW for in home mobility  ADL's / Homemaking Assistance Needed: Wife assisted with all ADLs and shower transfer. Pt able to  don/doff R BKA prosthesis with setup        Hand Dominance   Dominant Hand: Right    Extremity/Trunk Assessment   Upper Extremity Assessment: Defer to OT evaluation RUE Deficits / Details: Pt reports "frozen shoulder" with limited ROM (0-90 degrees); present PTA.    RUE Sensation: history of peripheral neuropathy LUE Deficits / Details: Pt reports "frozen shoulder" with limited ROM (0-90 degrees); present PTA.   Lower Extremity Assessment: Generalized weakness (RLE difficult to assess strength due to BKA)      Cervical / Trunk Assessment: Kyphotic  Communication   Communication: No difficulties  Cognition Arousal/Alertness: Awake/alert Behavior During Therapy: Flat affect Overall Cognitive Status: Within Functional Limits for tasks assessed                      General Comments General comments (skin integrity, edema, etc.): Wife present throughout. "We've been dealing with all this for 7 years." Discussed safety issues and she asked about a different seat for their shower (currently using drop-arm commode, however pt does not easily clear the drop arm with his genitals causing discomfort. Discussed that insurance typically does not pay for a shower chair. Offered some simple solutions (use tub bench in shower, change the manner he transfers into shower) and offered HHPT or OT to come assess their needs in their home. They both refused HH therapies    Exercises        Assessment/Plan    PT Assessment Patent does not need any further PT services  PT Diagnosis Generalized weakness   PT Problem List    PT Treatment Interventions     PT Goals (Current goals can be found in the Care Plan section) Acute Rehab PT Goals Patient Stated Goal: to return home PT Goal Formulation: All assessment and education complete, DC therapy    Frequency     Barriers to discharge        Co-evaluation               End of Session Equipment Utilized During Treatment: Gait  belt Activity Tolerance: Patient tolerated treatment well Patient left: in chair;with family/visitor present (pt prefers recliner by window; wife present and reaches call) Nurse Communication: Mobility status (no PT needs)    Functional Assessment Tool Used: clinical judgement Functional Limitation: Mobility: Walking and moving around Mobility: Walking and Moving Around Current Status [id number]): At least 1 percent but less than 20 percent impaired, limited or restricted Mobility: Walking and Moving Around Goal Status [phone]): At least 1 percent but less than 20 percent impaired, limited or restricted Mobility: Walking and Moving Around Discharge Status [phone]): At least 1 percent but less than 20 percent impaired, limited or restricted    Time: 0941-0958 PT Time Calculation (min) (ACUTE ONLY): 17 min   Charges:   PT Evaluation $PT Eval Low Complexity: 1 Procedure     PT G Codes:   PT G-Codes **NOT FOR INPATIENT CLASS** Functional Assessment Tool Used: clinical judgement Functional Limitation: Mobility: Walking and moving around Mobility: Walking and Moving Around Current Status [id number]): At least 1 percent but less than 20 percent impaired, limited or restricted Mobility: Walking and Moving Around Goal Status [phone]): At least 1 percent but less than 20  percent impaired, limited or restricted Mobility: Walking and Moving Around Discharge Status [phone]): At least 1 percent but less than 20 percent impaired, limited or restricted    Skyleen Bentley 12/06/2015, 10:28 AM Pager [phone]

## 2015-12-06 NOTE — Care Management Note (Signed)
Case Management Note  Patient Details  Name: [name] MRN: [id number] Date of Birth: 1951-04-15  Subjective/Objective:      Patient admitted with Rt sided weakness. MRI results negative. Patient is from home with his wife.             Action/Plan: Awaiting PT/OT recs. CM following for d/c needs.   Expected Discharge Date:                  Expected Discharge Plan:     In-House Referral:     Discharge planning Services     Post Acute Care Choice:    Choice offered to:     DME Arranged:    DME Agency:     HH Arranged:    HH Agency:     Status of Service:  In process, will continue to follow  Medicare Important Message Given:    Date Medicare IM Given:    Medicare IM give by:    Date Additional Medicare IM Given:    Additional Medicare Important Message give by:     If discussed at [place] of Stay Meetings, dates discussed:    Additional Comments:  Pollie Friar, RN 12/06/2015, 1:59 PM

## 2015-12-06 NOTE — Progress Notes (Signed)
STROKE TEAM PROGRESS NOTE   HISTORY OF PRESENT ILLNESS [name] is an 65 y.o. male history diabetes mellitus, hypertension, hyperlipidemia and peripheral vascular disease, presenting with new onset weakness involving his right side with slurred speech as well as intermittent loss of vision involving his right eye. Is also experiencing numbness involving his right upper extremity distally. He has no previous history of stroke nor TIA. He's been taking aspirin daily. CT scan of his head showed no acute intracranial abnormality. He was LKW 11 PM on 12/04/2015. Patient was not administered IV t-PA secondary to beyond time window for treatment consideration. He was admitted for further evaluation and treatment.   SUBJECTIVE (INTERVAL HISTORY) His wife is at the bedside.  Overall he feels his condition is rapidly improving. Confusion has improved per wife.   OBJECTIVE Temp:  [98.1 F (36.7 C)-100.4 F (38 C)] 98.1 F (36.7 C) (03/23 1000) Pulse Rate:  [67-104] 86 (03/23 1000) Cardiac Rhythm:  [-] Heart block (03/23 0700) Resp:  [12-20] 20 (03/23 1000) BP: (128-162)/(49-80) 132/55 mmHg (03/23 1000) SpO2:  [93 %-100 %] 100 % (03/23 1000) Weight:  [127.007 kg (280 lb)] 127.007 kg (280 lb) (03/22 1333)  CBC:   Recent Labs Lab 12/05/15 1653  WBC 8.0  NEUTROABS 3.3  HGB 12.0*  HCT 35.5*  MCV 101.1*  PLT [id number]    Basic Metabolic Panel:   Recent Labs Lab 12/05/15 1653  NA 135  K 3.5  CL 103  CO2 24  GLUCOSE 78  BUN 6  CREATININE 0.88  CALCIUM 9.9  MG 1.9    Lipid Panel:     Component Value Date/Time   CHOL 103 12/06/2015 0450   TRIG 167* 12/06/2015 0450   HDL 20* 12/06/2015 0450   CHOLHDL 5.2 12/06/2015 0450   VLDL 33 12/06/2015 0450   LDLCALC 50 12/06/2015 0450   HgbA1c:  Lab Results  Component Value Date   HGBA1C 10.4 04/30/2015   Urine Drug Screen:     Component Value Date/Time   LABOPIA POSITIVE* 12/05/2015 0019   COCAINSCRNUR NONE DETECTED 12/05/2015  0019   LABBENZ NONE DETECTED 12/05/2015 0019   AMPHETMU NONE DETECTED 12/05/2015 0019   THCU NONE DETECTED 12/05/2015 0019   LABBARB NONE DETECTED 12/05/2015 0019      IMAGING  Ct Head Wo Contrast 12/05/2015   Chronic ischemic changes and atrophy. No acute intracranial pathology.   MRI HEAD  12/05/2015  1. Limited study due to motion artifact and patient's inability to tolerate the full length of the exam. 2. No acute intracranial infarct. No definite other acute intracranial process identified. 3. Mild atrophy with chronic small vessel ischemic disease.   MRA HEAD  12/05/2015  Negative intracranial MRA. No large or proximal arterial branch occlusion. No high-grade or correctable stenosis.   Carotid Doppler   There is 1-39% bilateral ICA stenosis. Vertebral artery flow is antegrade.     PHYSICAL EXAM Pleasant middle-age Caucasian male not in distress. . Afebrile. Head is nontraumatic. Neck is supple without bruit.    Cardiac exam no murmur or gallop. Lungs are clear to auscultation. Distal pulses are well felt. Neurological Exam ;  Awake  Alert oriented x 3. Normal speech and language.eye movements full without nystagmus.fundi were not visualized. Vision acuity and fields appear normal. Hearing is normal. Palatal movements are normal. Face symmetric. Tongue midline. Normal strength, tone, reflexes and coordination. Normal sensation. Gait deferred. ASSESSMENT/PLAN Mr. [name] is a 65 y.o. male with history of diabetes  mellitus, hypertension, hyperlipidemia and peripheral vascular disease presenting who had a stress test yesterday with right sided weakness and slurred speech. He did not receive IV t-PA due to delay in arrival.   HA, vision changes and confusion post myoview with Lexicscan. ? Reaction to injection vs TIA (had lexiscan myoview in 2013 without incident). Prudent to workup both.  MRI  No acute stroke  MRA  Unremarkable   Carotid Doppler  No significant stenosis    2D Echo  pending   LDL 50  HgbA1c pending  Lovenox 40 mg sq daily for VTE prophylaxis Diet heart healthy/carb modified Room service appropriate?: Yes; Fluid consistency:: Thin  aspirin 81 mg daily prior to admission, now on aspirin 325 mg daily. Stay on aspirin for now given likely upcoming kidney stones and inability to do procedure on plavix. Once kidney issues addressed, change aspirin to plavix 75 mg daily for optimal stroke prevention.  Patient counseled to be compliant with his antithrombotic medications  Ongoing aggressive stroke risk factor management  Therapy recommendations:  No therapy needs  Disposition:  Return home  Hypertension  Stable  Hyperlipidemia  Home meds:  zocor 40, resumed in hospital  LDL 50, goal < 70  Continue statin at discharge  Diabetes type  II  HgbA1c pending , goal < 7.0  Follow[facility] Karna Dupes [facility]  Other Stroke Risk Factors  Former Cigarette smoker, quit smoking 37 years ago  Obesity, There is no weight on file to calculate BMI.   Other Active Problems  Chronic shoulder and back pain  GERD  Nephrolithiasis - has bilateral large kidney stones, planned procedure soon  Hospital day #   [place] for Pager information 12/06/2015 11:44 AM  I have personally examined this patient, reviewed notes, independently viewed imaging studies, participated in medical decision making and plan of care. I have made any additions or clarifications directly to the above note. Agree with note above. He presented with transient right-sided weakness and slurred speech and blurred vision like a due to a left brain TIA. He remains at risk for neurological worsening, recurrent stroke, TIA and needs ongoing stroke evaluation and aggressive risk factor modification. The patient's symptoms began after stress test and lateral some of his symptoms could be related to a reaction to Mindi Curling, [place] Pager: [phone] 12/06/2015 2:51 PM    To contact Stroke Continuity provider, please refer to [email or web address] After hours, contact General Neurology

## 2015-12-06 NOTE — Progress Notes (Signed)
[facility] PROGRESS NOTE  [name] [name] DOB: October 07, 1950 DOA: 12/05/2015 PCP: Wendie Agreste, MD  Assessment/Plan: Principal Problem:   Right sided weakness, dizziness, headache, right-sided vision loss, slurred speech - patient had left CT scan Myoview prior to admission and currently neurology is questioning whether patient has side effect of medication administered for evaluation. Workup currently negative but pending. Awaiting final results from work of further disposition planning. Discharge home if no new findings on echo.   Active Problems:   HYPERCHOLESTEROLEMIA - stable on statin. Continue current regimen    Essential hypertension - last blood pressure normotensive off antihypertensive medications  Fever/UTI - patient spike fever and urinalysis suspicious for small leukocytes, urine culture currently inconclusive. We'll place on Rocephin while awaiting urine culture results    SHOULDER PAIN, BILATERAL - continue supportive therapy   DDD (degenerative disc disease), lumbosacral   CAD (coronary artery disease) - continue statins, stable no chest pain reported    Gastroesophageal reflux disease with hiatal hernia   Diabetic retinopathy (HCC)   DM (diabetes mellitus) type II uncontrolled with eye manifestation ([place]) - continue heart healthy/carb modified diet    DDD (degenerative disc disease), lumbar   Nephrolithiasis - patient has procedures for kidney stones as such neurology recommended discharging on aspirin 325 mg by mouth daily instead of Plavix  Code Status: full Family Communication: discussed with patient and wife at bedside Disposition Plan: as listed above   Consultants:  neurology  Procedures:  Stroke workup  Antibiotics:  none  HPI/Subjective: Patient has no new complaints. Wife states that he is back at baseline  Objective: Filed Vitals:   12/06/15 1218 12/06/15 1337  BP: 146/61 112/57  Pulse: 78 110  Temp: 98.4  F (36.9 C) 98.4 F (36.9 C)  Resp: 20 20    Intake/Output Summary (Last 24 hours) at 12/06/15 1409 Last data filed at 12/06/15 1230  Gross per 24 hour  Intake    720 ml  Output    700 ml  Net     20 ml   There were no vitals filed for this visit.  Exam:   General:  Patient in no acute distress, alert and awake  Cardiovascular: regular rate and rhythm no rubs  Respiratory: no increased work of breathing, no wheezes  Abdomen: soft, nondistended, nontender  Musculoskeletal: no cyanosis or clubbing   Data Reviewed: Basic Metabolic Panel:  Recent Labs Lab 12/05/15 1653  NA 135  K 3.5  CL 103  CO2 24  GLUCOSE 78  BUN 6  CREATININE 0.88  CALCIUM 9.9  MG 1.9   Liver Function Tests: No results for input(s): AST, ALT, ALKPHOS, BILITOT, PROT, ALBUMIN in the last 168 hours. No results for input(s): LIPASE, AMYLASE in the last 168 hours. No results for input(s): AMMONIA in the last 168 hours. CBC:  Recent Labs Lab 12/05/15 1653  WBC 8.0  NEUTROABS 3.3  HGB 12.0*  HCT 35.5*  MCV 101.1*  PLT 270   Cardiac Enzymes: No results for input(s): CKTOTAL, CKMB, CKMBINDEX, TROPONINI in the last 168 hours. BNP (last 3 results) No results for input(s): BNP in the last 8760 hours.  ProBNP (last 3 results) No results for input(s): PROBNP in the last 8760 hours.  CBG:  Recent Labs Lab 12/06/15 0024 12/06/15 0643 12/06/15 1142  GLUCAP 247* 164* 192*    Recent Results (from the past 240 hour(s))  Urine culture     Status: None (Preliminary result)   Collection Time: 12/05/15  7:47 PM  Result Value Ref Range Status   Specimen Description URINE, RANDOM  Final   Special Requests NONE  Final   Culture TOO YOUNG TO READ  Final   Report Status PENDING  Incomplete     Studies: Ct Head Wo Contrast  12/05/2015  CLINICAL DATA:  Memory loss and generalized weakness EXAM: CT HEAD WITHOUT CONTRAST TECHNIQUE: Contiguous axial images were obtained from the base of the  skull through the vertex without intravenous contrast. COMPARISON:  None. FINDINGS: There is no mass effect, midline shift, or acute intracranial hemorrhage. Global atrophy is present. There are chronic ischemic changes in the periventricular white matter as well as the gray-white junction of the right frontal lobe. Mastoid air cells are clear. Cranium is intact. Dense mucosal thickening in the right maxillary sinus. There is scattered mucosal thickening in the ethmoid air cells worse on the right. IMPRESSION: Chronic ischemic changes and atrophy. No acute intracranial pathology. Electronically Signed   By: Marybelle Killings M.D.   On: 12/05/2015 18:50   Mr Brain Wo Contrast  12/05/2015  CLINICAL DATA:  Initial evaluation for acute right-sided weakness with dizziness and headache. EXAM: MRI HEAD WITHOUT CONTRAST MRA HEAD WITHOUT CONTRAST TECHNIQUE: Multiplanar, multiecho pulse sequences of the brain and surrounding structures were obtained without intravenous contrast. Angiographic images of the head were obtained using MRA technique without contrast. COMPARISON:  Prior CT from earlier the same day. FINDINGS: MRI HEAD FINDINGS Study limited due to motion artifact and patient's inability to tolerate the full length of the exam. Diffusion-weighted sequences demonstrate no abnormal foci of restricted diffusion to suggest acute infarct. Gray-white matter differentiation grossly maintained. Major intracranial vascular flow voids are preserved. Mild atrophy with chronic small vessel ischemic disease. No definite mass lesion identified. No mass effect or midline shift. No hydrocephalus. No extra-axial fluid collection. Major dural sinuses are grossly patent. No definite acute abnormality about the orbits. Sequela prior bilateral lens extraction noted. Moderate mucosal thickening within the right maxilla sinus and ethmoidal air cells on the right. No significant mastoid effusion. Bone marrow signal intensity grossly normal.  No scalp soft tissue abnormality. MRA HEAD FINDINGS ANTERIOR CIRCULATION: Visualized distal cervical segments of the internal carotid arteries are patent with antegrade flow. Petrous, cavernous, and supraclinoid segments patent without flow limiting stenosis. A1 segments, anterior communicating artery, and anterior cerebral arteries patent bilaterally. M1 segments patent without stenosis or occlusion. MCA bifurcations normal. Distal MCA branches symmetric and fairly well opacified bilaterally. POSTERIOR CIRCULATION: Vertebral arteries patent to the vertebrobasilar junction. Right vertebral artery dominant. Posterior inferior cerebral arteries patent proximally. Basilar artery patent to its distal aspect. Superior cerebellar arteries well opacified bilaterally. Hypoplastic left P1 segment with prominent left posterior communicating artery. Small right posterior communicating artery noted as well. PCAs well opacified to their distal aspects. No aneurysm. IMPRESSION: MRI HEAD IMPRESSION: 1. Limited study due to motion artifact and patient's inability to tolerate the full length of the exam. 2. No acute intracranial infarct. No definite other acute intracranial process identified. 3. Mild atrophy with chronic small vessel ischemic disease. MRA HEAD IMPRESSION: Negative intracranial MRA. No large or proximal arterial branch occlusion. No high-grade or correctable stenosis. Electronically Signed   By: Jeannine Boga M.D.   On: 12/05/2015 23:55   Mr Jodene Nam Head/brain Wo Cm  12/05/2015  CLINICAL DATA:  Initial evaluation for acute right-sided weakness with dizziness and headache. EXAM: MRI HEAD WITHOUT CONTRAST MRA HEAD WITHOUT CONTRAST TECHNIQUE: Multiplanar, multiecho pulse sequences of the brain and  surrounding structures were obtained without intravenous contrast. Angiographic images of the head were obtained using MRA technique without contrast. COMPARISON:  Prior CT from earlier the same day. FINDINGS: MRI HEAD  FINDINGS Study limited due to motion artifact and patient's inability to tolerate the full length of the exam. Diffusion-weighted sequences demonstrate no abnormal foci of restricted diffusion to suggest acute infarct. Gray-white matter differentiation grossly maintained. Major intracranial vascular flow voids are preserved. Mild atrophy with chronic small vessel ischemic disease. No definite mass lesion identified. No mass effect or midline shift. No hydrocephalus. No extra-axial fluid collection. Major dural sinuses are grossly patent. No definite acute abnormality about the orbits. Sequela prior bilateral lens extraction noted. Moderate mucosal thickening within the right maxilla sinus and ethmoidal air cells on the right. No significant mastoid effusion. Bone marrow signal intensity grossly normal. No scalp soft tissue abnormality. MRA HEAD FINDINGS ANTERIOR CIRCULATION: Visualized distal cervical segments of the internal carotid arteries are patent with antegrade flow. Petrous, cavernous, and supraclinoid segments patent without flow limiting stenosis. A1 segments, anterior communicating artery, and anterior cerebral arteries patent bilaterally. M1 segments patent without stenosis or occlusion. MCA bifurcations normal. Distal MCA branches symmetric and fairly well opacified bilaterally. POSTERIOR CIRCULATION: Vertebral arteries patent to the vertebrobasilar junction. Right vertebral artery dominant. Posterior inferior cerebral arteries patent proximally. Basilar artery patent to its distal aspect. Superior cerebellar arteries well opacified bilaterally. Hypoplastic left P1 segment with prominent left posterior communicating artery. Small right posterior communicating artery noted as well. PCAs well opacified to their distal aspects. No aneurysm. IMPRESSION: MRI HEAD IMPRESSION: 1. Limited study due to motion artifact and patient's inability to tolerate the full length of the exam. 2. No acute intracranial  infarct. No definite other acute intracranial process identified. 3. Mild atrophy with chronic small vessel ischemic disease. MRA HEAD IMPRESSION: Negative intracranial MRA. No large or proximal arterial branch occlusion. No high-grade or correctable stenosis. Electronically Signed   By: Jeannine Boga M.D.   On: 12/05/2015 23:55    Scheduled Meds: . aspirin  300 mg Rectal Daily   Or  . aspirin  325 mg Oral Daily  . docusate sodium  200 mg Oral Daily  . enoxaparin (LOVENOX) injection  40 mg Subcutaneous Daily  . insulin aspart  0-5 Units Subcutaneous QHS  . insulin aspart  0-9 Units Subcutaneous TID WC  . pantoprazole  40 mg Oral Daily  . pregabalin  100 mg Oral TID  . simvastatin  40 mg Oral QHS  . traZODone  100 mg Oral QHS   Continuous Infusions: . sodium chloride 75 mL/hr at 12/06/15 0059    Time spent: > 35 minutes    Velvet Bathe  [facility] Pager [phone] If 7PM-7AM, please contact night-coverage at [email or web address], password [facility] 12/06/2015, 2:09 PM

## 2015-12-06 NOTE — Progress Notes (Signed)
Pt self administered 60 units of u500 insulin from home, based on sliding scale coverage for CBG of 247. Pt was told he could use home insulin per Dr Blaine Hamper. Fortino Sic, RN, BSN 12/06/2015 12:27 AM

## 2015-12-06 NOTE — Progress Notes (Signed)
Ceftriaxone for UTI per pharmacy ordered.  Ceftriaxone does not need renal adjustment.  [employer] policy allows pharmacy to change the ordered dose based on indication without contacting the physician, therefore a consult is not needed.   Plan: -ceftriaxone 2g IV q24h d/t weight -pharmacy to sign off as no adjustment needed.  Monica Zahler D. Gayle Martinez, PharmD, BCPS Clinical Pharmacist Pager: [phone] 12/06/2015 2:40 PM
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
## 2015-12-06 NOTE — Care Management Obs Status (Signed)
[place] NOTIFICATION   Patient Details  Name: BOYD MAGSINO MRN: [id number] Date of Birth: [birth year]   Medicare Observation Status Notification Given:  Yes (MRI results negative)    Pollie Friar, RN 12/06/2015, 1:49 PM

## 2015-12-06 NOTE — Progress Notes (Signed)
VASCULAR LAB PRELIMINARY  PRELIMINARY  PRELIMINARY  PRELIMINARY  Carotid duplex completed.    Preliminary report:  1-39% ICA plaquing.  Vertebral artery flow is antegrade.   Lucianne Smestad, RVT 12/06/2015, 11:02 AM

## 2015-12-06 NOTE — Evaluation (Signed)
Occupational Therapy Evaluation Patient Details Name: [name] MRN: [id number] DOB: Jan 03, 1951 Today's Date: 12/06/2015    History of Present Illness 65 y.o. male history diabetes mellitus, hypertension, hyperlipidemia and peripheral vascular disease, presenting with new onset weakness involving his right side with slurred speech as well as intermittent loss of vision involving his right eye. Is also experiencing numbness involving his right upper extremity distally. CT scan of his head showed no acute intracranial abnormality.    Clinical Impression   Pt reports his wife provided assist for ADLs and he used a w/c or RW for mobility PTA. Currently pt is overall mod assist for sit to stand transfers, min guard for safety with functional mobility and mod assist for ADLs. Pt and wife report they feel pt has returned to baseline and wife reports she feels comfortable with providing the level of care pt will need upon return home. All education complete; pt and wife with no further questions or concerns for OT at this time. No further acute OT needs identified; signing off at this time. Please re-consult if needs change. Thank you for this referral.     Follow Up Recommendations  No OT follow up;Supervision/Assistance - 24 hour    Equipment Recommendations  None recommended by OT    Recommendations for Other Services PT consult     Precautions / Restrictions Precautions Precautions: None Required Braces or Orthoses: Other Brace/Splint Other Brace/Splint: R BKA prosthesis Restrictions Weight Bearing Restrictions: No      Mobility Bed Mobility               General bed mobility comments: Pt OOB in chair upon arrival.  Transfers Overall transfer level: Needs assistance Equipment used: Rolling walker (2 wheeled) Transfers: Sit to/from Stand Sit to Stand: Mod assist         General transfer comment: Wife able to demo assisting pt with sit to stand transfer from  chair.    Balance Overall balance assessment: Needs assistance Sitting-balance support: Feet supported;No upper extremity supported Sitting balance-Leahy Scale: Fair     Standing balance support: Single extremity supported;During functional activity Standing balance-Leahy Scale: Fair                              ADL Overall ADL's : Needs assistance/impaired Eating/Feeding: Set up;Sitting   Grooming: Min guard;Standing;Wash/dry face;Oral care   Upper Body Bathing: Moderate assistance;Sitting   Lower Body Bathing: Maximal assistance;Sitting/lateral leans   Upper Body Dressing : Moderate assistance;Sitting   Lower Body Dressing: Maximal assistance;Sitting/lateral leans Lower Body Dressing Details (indicate cue type and reason): Pt reports no difficulties donning R BKA prosthesis this AM. Toilet Transfer: Ambulation;RW;BSC;Moderate assistance (BSC over toilet) Toilet Transfer Details (indicate cue type and reason): Mod A for sit to stand Toileting- Water quality scientist and Hygiene: Min guard;Sit to/from stand       Functional mobility during ADLs: Min guard;Rolling walker General ADL Comments: Pts wife present for OT eval. Pt and wife report they feel that the pt has returned to baseline level of function and wife feels she can assist pt as needed upon return home. Pt reports visual deficits have resolved but has discharge coming from bilateral eyes; MD and RN aware.      Vision Vision Assessment?: No apparent visual deficits   Perception     Praxis      Pertinent Vitals/Pain Pain Assessment: No/denies pain     Hand Dominance Right  Extremity/Trunk Assessment Upper Extremity Assessment Upper Extremity Assessment: RUE deficits/detail;LUE deficits/detail RUE Deficits / Details: Pt reports "frozen shoulder" with limited ROM (0-90 degrees); present PTA.  RUE Sensation: history of peripheral neuropathy LUE Deficits / Details: Pt reports "frozen shoulder"  with limited ROM (0-90 degrees); present PTA. LUE Sensation: history of peripheral neuropathy   Lower Extremity Assessment Lower Extremity Assessment: Defer to PT evaluation   Cervical / Trunk Assessment Cervical / Trunk Assessment: Other exceptions   Communication Communication Communication: No difficulties   Cognition Arousal/Alertness: Awake/alert Behavior During Therapy: Flat affect Overall Cognitive Status: Within Functional Limits for tasks assessed                     General Comments       Exercises       Shoulder Instructions      Home Living Family/patient expects to be discharged to:: Private residence Living Arrangements: Spouse/significant other Available Help at Discharge: Family;Available 24 hours/day Type of Home: House Home Access: Stairs to enter [employer] of Steps: 3 Entrance Stairs-Rails: Right Home Layout: One level     Bathroom Shower/Tub: Occupational psychologist: Handicapped height Bathroom Accessibility: Yes How Accessible: Accessible via walker Home Equipment: Bruno - 2 wheels;Cane - single point;Shower seat;Grab bars - tub/shower;Hand held shower head;Adaptive equipment;Wheelchair - Higher education careers adviser: Reacher        Prior Functioning/Environment Level of Independence: Needs assistance  Gait / Transfers Assistance Needed: Used w/c for community mobility; RW for in home mobility ADL's / Homemaking Assistance Needed: Wife assisted with all ADLs and shower transfer. Pt able to don/doff R BKA prosthesis with setup        OT Diagnosis: Generalized weakness   OT Problem List:     OT Treatment/Interventions:      OT Goals(Current goals can be found in the care plan section) Acute Rehab OT Goals Patient Stated Goal: to return home OT Goal Formulation: All assessment and education complete, DC therapy  OT Frequency:     Barriers to D/C:            Co-evaluation              End of  Session Equipment Utilized During Treatment: Gait belt;Rolling walker;Other (comment) (R BKA prosthesis) Nurse Communication: Mobility status  Activity Tolerance: Patient tolerated treatment well Patient left: in chair;with call bell/phone within reach;with family/visitor present   Time: [id number] OT Time Calculation (min): 18 min Charges:  OT General Charges $OT Visit: 1 Procedure OT Evaluation $OT Eval Moderate Complexity: 1 Procedure G-Codes: OT G-codes **NOT FOR INPATIENT CLASS** Functional Assessment Tool Used: Clinical judgement Functional Limitation: Self care Self Care Current Status [id number]): At least 40 percent but less than 60 percent impaired, limited or restricted Self Care Goal Status [id number]): At least 40 percent but less than 60 percent impaired, limited or restricted Self Care Discharge Status [phone]): At least 40 percent but less than 60 percent impaired, limited or restricted   Binnie Kand M.S., OTR/L Pager: [phone]  12/06/2015, 9:13 AM

## 2015-12-07 ENCOUNTER — Observation Stay (HOSPITAL_COMMUNITY): Payer: Medicare Other

## 2015-12-07 ENCOUNTER — Observation Stay (HOSPITAL_BASED_OUTPATIENT_CLINIC_OR_DEPARTMENT_OTHER): Payer: Medicare Other

## 2015-12-07 DIAGNOSIS — J209 Acute bronchitis, unspecified: Secondary | ICD-10-CM

## 2015-12-07 DIAGNOSIS — I6789 Other cerebrovascular disease: Secondary | ICD-10-CM

## 2015-12-07 DIAGNOSIS — M6289 Other specified disorders of muscle: Secondary | ICD-10-CM | POA: Diagnosis not present

## 2015-12-07 DIAGNOSIS — R918 Other nonspecific abnormal finding of lung field: Secondary | ICD-10-CM | POA: Diagnosis not present

## 2015-12-07 DIAGNOSIS — G459 Transient cerebral ischemic attack, unspecified: Principal | ICD-10-CM

## 2015-12-07 DIAGNOSIS — H1033 Unspecified acute conjunctivitis, bilateral: Secondary | ICD-10-CM | POA: Diagnosis not present

## 2015-12-07 LAB — HEMOGLOBIN A1C
HEMOGLOBIN A1C: 7.3 % — AB (ref 4.8–5.6)
Mean Plasma Glucose: 163 mg/dL

## 2015-12-07 LAB — GLUCOSE, CAPILLARY
Glucose-Capillary: 197 mg/dL — ABNORMAL HIGH (ref 65–99)
Glucose-Capillary: 227 mg/dL — ABNORMAL HIGH (ref 65–99)

## 2015-12-07 LAB — URINE CULTURE

## 2015-12-07 LAB — ECHOCARDIOGRAM COMPLETE
HEIGHTINCHES: 73 in
WEIGHTICAEL: 4480 [oz_av]

## 2015-12-07 MED ORDER — AMOXICILLIN-POT CLAVULANATE 875-125 MG PO TABS: 1.0000 | ORAL_TABLET | Freq: Two times a day (BID) | ORAL | Status: AC

## 2015-12-07 MED ORDER — POLYVINYL ALCOHOL 1.4 % OP SOLN: 1.0000 [drp] | OPHTHALMIC | Status: DC | PRN

## 2015-12-07 MED ORDER — ASPIRIN 325 MG PO TABS: 325.0000 mg | ORAL_TABLET | Freq: Every day | ORAL | Status: DC

## 2015-12-07 MED ORDER — CIPROFLOXACIN HCL 0.3 % OP SOLN
2.0000 [drp] | OPHTHALMIC | Status: DC
  Administered 2015-12-07: 2 [drp] via OPHTHALMIC
  Filled 2015-12-07: qty 2.5

## 2015-12-07 MED ORDER — AMOXICILLIN-POT CLAVULANATE 875-125 MG PO TABS
1.0000 | ORAL_TABLET | Freq: Two times a day (BID) | ORAL | Status: DC
  Administered 2015-12-07: 1 via ORAL
  Filled 2015-12-07: qty 1

## 2015-12-07 MED ORDER — CIPROFLOXACIN HCL 0.3 % OP SOLN: 2.0000 [drp] | OPHTHALMIC | Status: DC

## 2015-12-07 MED ORDER — ONDANSETRON HCL 4 MG/2ML IJ SOLN
4.0000 mg | Freq: Four times a day (QID) | INTRAMUSCULAR | Status: DC | PRN
  Administered 2015-12-07: 4 mg via INTRAVENOUS
  Filled 2015-12-07: qty 2

## 2015-12-07 MED ORDER — PERFLUTREN LIPID MICROSPHERE
1.0000 mL | INTRAVENOUS | Status: AC | PRN
Start: 2015-12-07 — End: 2015-12-07
  Administered 2015-12-07: 2 mL via INTRAVENOUS
  Filled 2015-12-07: qty 10

## 2015-12-07 NOTE — Progress Notes (Signed)
  Echocardiogram 2D Echocardiogram has been performed.  Jennette Dubin 12/07/2015, 10:21 AM

## 2015-12-07 NOTE — Progress Notes (Signed)
STROKE TEAM PROGRESS NOTE   HISTORY OF PRESENT ILLNESS [name] is an 65 y.o. male history diabetes mellitus, hypertension, hyperlipidemia and peripheral vascular disease, presenting with new onset weakness involving his right side with slurred speech as well as intermittent loss of vision involving his right eye. Is also experiencing numbness involving his right upper extremity distally. He has no previous history of stroke nor TIA. He's been taking aspirin daily. CT scan of his head showed no acute intracranial abnormality. He was LKW 11 PM on 12/04/2015. Patient was not administered IV t-PA secondary to beyond time window for treatment consideration. He was admitted for further evaluation and treatment.   SUBJECTIVE (INTERVAL HISTORY) His wife is at the bedside.  Overall he feels his condition is rapidly improving. Confusion has improved per wife.   OBJECTIVE Temp:  [98.5 F (36.9 C)-99.7 F (37.6 C)] 99.7 F (37.6 C) (03/24 0943) Pulse Rate:  [91-105] 105 (03/24 0943) Cardiac Rhythm:  [-] Normal sinus rhythm (03/24 0730) Resp:  [20] 20 (03/24 0943) BP: (140-167)/(54-74) 167/60 mmHg (03/24 0943) SpO2:  [94 %-98 %] 94 % (03/24 0943)  CBC:   Recent Labs Lab 12/05/15 1653  WBC 8.0  NEUTROABS 3.3  HGB 12.0*  HCT 35.5*  MCV 101.1*  PLT [id number]    Basic Metabolic Panel:   Recent Labs Lab 12/05/15 1653  NA 135  K 3.5  CL 103  CO2 24  GLUCOSE 78  BUN 6  CREATININE 0.88  CALCIUM 9.9  MG 1.9    Lipid Panel:     Component Value Date/Time   CHOL 103 12/06/2015 0450   TRIG 167* 12/06/2015 0450   HDL 20* 12/06/2015 0450   CHOLHDL 5.2 12/06/2015 0450   VLDL 33 12/06/2015 0450   LDLCALC 50 12/06/2015 0450   HgbA1c:  Lab Results  Component Value Date   HGBA1C 7.3* 12/06/2015   Urine Drug Screen:     Component Value Date/Time   LABOPIA POSITIVE* 12/05/2015 0019   COCAINSCRNUR NONE DETECTED 12/05/2015 0019   LABBENZ NONE DETECTED 12/05/2015 0019   AMPHETMU  NONE DETECTED 12/05/2015 0019   THCU NONE DETECTED 12/05/2015 0019   LABBARB NONE DETECTED 12/05/2015 0019      IMAGING  Ct Head Wo Contrast 12/05/2015   Chronic ischemic changes and atrophy. No acute intracranial pathology.   MRI HEAD  12/05/2015  1. Limited study due to motion artifact and patient's inability to tolerate the full length of the exam. 2. No acute intracranial infarct. No definite other acute intracranial process identified. 3. Mild atrophy with chronic small vessel ischemic disease.   MRA HEAD  12/05/2015  Negative intracranial MRA. No large or proximal arterial branch occlusion. No high-grade or correctable stenosis.   Carotid Doppler   There is 1-39% bilateral ICA stenosis. Vertebral artery flow is antegrade.     PHYSICAL EXAM Pleasant middle-age Caucasian male not in distress. . Afebrile. Head is nontraumatic. Neck is supple without bruit.    Cardiac exam no murmur or gallop. Lungs are clear to auscultation. Distal pulses are well felt. Neurological Exam ;  Awake  Alert oriented x 3. Normal speech and language.eye movements full without nystagmus.fundi were not visualized. Vision acuity and fields appear normal. Hearing is normal. Palatal movements are normal. Face symmetric. Tongue midline. Normal strength, tone, reflexes and coordination. Normal sensation. Gait deferred. ASSESSMENT/PLAN Mr. [name] is a 65 y.o. male with history of diabetes mellitus, hypertension, hyperlipidemia and peripheral vascular disease presenting who had a  stress test yesterday with right sided weakness and slurred speech. He did not receive IV t-PA due to delay in arrival.   HA, vision changes and confusion post myoview with Lexicscan. ? Reaction to injection vs TIA (had lexiscan myoview in 2013 without incident). Prudent to workup both.  MRI  No acute stroke  MRA  Unremarkable   Carotid Doppler  No significant stenosis  2D Echo  Left ventricle: The cavity size was normal. Wall  thickness was  increased in a pattern of mild LVH. Systolic function was  vigorous. The estimated ejection fraction was in the range of 65%  to 70%. Wall motion was normal; there were no regional wall   motion abnormalities.  LDL 50  HgbA1c 7.3  Lovenox 40 mg sq daily for VTE prophylaxis    aspirin 81 mg daily prior to admission, now on aspirin 325 mg daily. Stay on aspirin for now given likely upcoming kidney stones and inability to do procedure on plavix. Once kidney issues addressed, change aspirin to plavix 75 mg daily for optimal stroke prevention.  Patient counseled to be compliant with his antithrombotic medications  Ongoing aggressive stroke risk factor management  Therapy recommendations:  No therapy needs  Disposition:  Return home  Hypertension  Stable  Hyperlipidemia  Home meds:  zocor 40, resumed in hospital  LDL 50, goal < 70  Continue statin at discharge  Diabetes type  II  HgbA1c 7.3, goal < 7.0  Followed by Dr. Karna Dupes at [facility]  Other Stroke Risk Factors  Former Cigarette smoker, quit smoking 37 years ago  Obesity, There is no weight on file to calculate BMI.   Other Active Problems  Chronic shoulder and back pain  GERD  Nephrolithiasis - has bilateral large kidney stones, planned procedure soon  Hospital day #   [place] for Pager information 12/07/2015 6:02 PM  I have personally examined this patient, reviewed notes, independently viewed imaging studies, participated in medical decision making and plan of care. I have made any additions or clarifications directly to the above note.  DC home today. D/w patient and wife James Contras, MD Medical Director Onset Pager: [phone] 12/07/2015 6:02 PM    To contact Stroke Continuity provider, please refer to [email or web address] After hours, contact General Neurology

## 2015-12-07 NOTE — Discharge Summary (Addendum)
Physician Discharge Summary  Kinsey Rupani [facility] [id number] DOB: [birth year] DOA: 12/05/2015  PCP: Wendie Agreste, MD  Admit date: 12/05/2015 Discharge date: 12/07/2015  Time spent: 25 minutes  Recommendations for Outpatient Follow-up:  1. Discharge home with outpatient PCP follow-up. Patient will be discharged on baby aspirin for now until his renal procedure done and then he would need to be started on Plavix 75 mg daily. 2. Will discharge him on 5 days of oral Augmentin for acute bronchitis. Stop date 3/29.   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  History of present illness:  Please refer to admission H&P for details, in brief, 65 year old male with history of uncontrolled diabetes mellitus, COPD, GERD, anxiety and depression, kidney stones, OSA, hypertension, hyperlipidemia, peripheral vascular disease status post right BKA presented with new onset weakness on his right side with slurred speech and intermittent loss of vision involving his right eye. He also had numbness in his right upper extremity. No prior history of TIA or stroke. Patient is on baby aspirin daily. In the ED vitals were stable except for temperature of 99.9 Fahrenheit, normal CBC and chemistry. Head CT was negative for acute abnormality, showed chronic ischemic  changes and atrophy. Admitted for further management. Of note patient had a Lexiscan Myoview done on 12/04/2015 and received IV contrast.  Hospital Course:  TIA Symptoms resolved. MRI brain without acute findings. 2-D echo with normal EF of 65 and 70% with no wall seen abnormality or cardiac source of emboli. Carotid Doppler without significant stenosis.  LDL 50. Continue statin. A1c of 7.3. Patient on baby aspirin and recommend to be switched to Plavix however he is having surgery for his kidney stones Week so will discharge him on baby aspirin for now and once that issue is resolved he should be started on Plavix. -Appreciate stroke team recommendations. -Seen by PT/OT and no further recommendations.   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 Continue home dose of insulin.  Acute bronchitis Will discharge on 5 day course of Augmentin. Chest x-ray unremarkable.  Acute conjunctivitis Added Cipro Floxin drops and artificial tears.  Chronic back pain Continue home medications.  ?UTI Positive UA on admission. Culture growing multiple species. She being discharged on Augmentin for bronchitis that should cover.  Coronary artery disease Recent Lexiscan Myoview on 3/21 was normal. Continue aspirin and statin.  Patient is stable to be discharged home with outpatient follow-up.  Procedures:  Head CT  MRI brain  2-D echo  Carotid Doppler  Consultations:  Neurology  Discharge Exam: Filed Vitals:   12/07/15 0544 12/07/15 0943  BP: 147/74 167/60  Pulse: 91 105  Temp: 98.5 F (36.9 C) 99.7 F (37.6 C)  Resp: 20 20    General: Middle aged obese male not in distress HEENT: No pallor, moist mucosa, bilateral conjunctivitis  chest: Clear bilaterally CVS:  Normal S1 and S2, no murmurs rub or gallop GI: Soft, nondistended, nontender, bowel sounds present Musculoskeletal: Warm, no edema , left BKA CNS: Alert  and oriented on nonfocal  Discharge Instructions    Current Discharge Medication List    START taking these medications   Details  amoxicillin-clavulanate (AUGMENTIN) 875-125 MG tablet Take 1 tablet by mouth every 12 (twelve) hours. Qty: 10 tablet, Refills: 0    ciprofloxacin (CILOXAN) 0.3 % ophthalmic solution Place 2 drops into both eyes every 4 (four) hours while awake. Administer 1 drop, every 2 hours, while awake, for 2 days. Then 1 drop, every 4 hours, while awake, for the next 5 days. Qty: 10 mL, Refills: 0    polyvinyl alcohol (LIQUIFILM TEARS) 1.4 % ophthalmic solution Place 1 drop into both eyes as needed for dry eyes. Qty: 15 mL, Refills: 0      CONTINUE these medications which have NOT CHANGED   Details  aspirin EC 81 MG tablet Take 81 mg by mouth daily. Reported on 12/05/2015 Qty: 1 tablet, Refills: 0   Associated Diagnoses: Coronary atherosclerosis of native coronary artery    DEXILANT 30 MG capsule TAKE 1 CAPSULE(30 MG) BY MOUTH DAILY Qty: 30 capsule, Refills: 4    docusate sodium (COLACE) 250 MG capsule Take 250 mg by mouth daily.    furosemide (LASIX) 40 MG tablet TAKE 1 TABLET BY MOUTH EVERY DAY Qty: 30 tablet, Refills: 3    HUMULIN R U-500 KWIKPEN 500 UNIT/ML kwikpen Inject 25-105 Units into the skin 3 (three) times daily. Per sliding scale Per office visit note on chart of 11/01/15 the sliding scale is as follows; 65 + 5 units at breakfast, 45+5 units at lunch and 45 + 5 units at supper. Refills: 11    LORazepam (ATIVAN) 0.5 MG tablet Take 1 tablet at bedtime prn anxiety. Qty: 30 tablet, Refills: 2    naproxen (NAPROSYN) 500 MG tablet Take 500 mg by mouth 2 (two) times daily as needed for mild pain.    nystatin (MYCOSTATIN) powder Apply topically 3 (three) times daily. Qty: 15 g, Refills: 0   Associated Diagnoses: Candidal intertrigo    ondansetron (ZOFRAN) 4 MG tablet Take 1-2 tablets (4-8 mg total) by mouth every 8 (eight) hours as needed. Qty: 30  tablet, Refills: 0    oxyCODONE (ROXICODONE) 15 MG immediate release tablet Take 15 mg by mouth every 6 (six) hours as needed for pain.  Refills: 0    pregabalin (LYRICA) 100 MG capsule Take 100 mg by mouth 3 (three) times daily.    promethazine (PHENERGAN) 25 MG tablet Take 1 tablet (25 mg total) by mouth every 8 (eight) hours as needed for nausea or vomiting. Qty: 30 tablet, Refills: 1   Associated Diagnoses: Non-intractable vomiting with nausea, vomiting of unspecified type; Gastroesophageal reflux disease with esophagitis    simvastatin (ZOCOR) 40 MG tablet Take 1 tablet (40 mg total) by mouth at bedtime. Qty: 90 tablet, Refills: 3    traZODone (DESYREL) 100 MG tablet TAKE 1 TABLET BY MOUTH AT BEDTIME Qty: 30 tablet, Refills: 0    zolpidem (AMBIEN) 10 MG tablet Take 1 tablet (10 mg total) by mouth at bedtime as needed for sleep. Qty: 30 tablet, Refills: 3    Vitamin D, Ergocalciferol, (DRISDOL) 50000 UNITS CAPS capsule Take 1 capsule by mouth every 7 days. PATIENT NEEDS OFFICE VISIT/LABS FOR ADDITIONAL REFILLS Qty: 12 capsule, Refills: 0      STOP taking these medications  ketoconazole (NIZORAL) 2 % cream        Allergies  Allergen Reactions  . Symbicort [Budesonide-Formoterol Fumarate] Other (See Comments)    Hyperglycemia   . Advair Diskus [Fluticasone-Salmeterol] Other (See Comments)    hyperglycemia  . Hydromorphone Nausea And Vomiting    Itching   . Morphine Itching  . Soap Itching and Other (See Comments)    Other Reaction: ivory soap=itching    Follow-up Information    Follow up with GREENE,JEFFREY R, MD. Schedule an appointment as soon as possible for a visit in 1 week.   Specialties:  Family Medicine, Sports Medicine   Contact information:   [address] [place] [id number] [phone]        The results of significant diagnostics from this hospitalization (including imaging, microbiology, ancillary and laboratory) are listed below for  reference.    Significant Diagnostic Studies: Ct Head Wo Contrast  12/05/2015  CLINICAL DATA:  Memory loss and generalized weakness EXAM: CT HEAD WITHOUT CONTRAST TECHNIQUE: Contiguous axial images were obtained from the base of the skull through the vertex without intravenous contrast. COMPARISON:  None. FINDINGS: There is no mass effect, midline shift, or acute intracranial hemorrhage. Global atrophy is present. There are chronic ischemic changes in the periventricular white matter as well as the gray-white junction of the right frontal lobe. Mastoid air cells are clear. Cranium is intact. Dense mucosal thickening in the right maxillary sinus. There is scattered mucosal thickening in the ethmoid air cells worse on the right. IMPRESSION: Chronic ischemic changes and atrophy. No acute intracranial pathology. Electronically Signed   By: Marybelle Killings M.D.   On: 12/05/2015 18:50   Mr Brain Wo Contrast  12/05/2015  CLINICAL DATA:  Initial evaluation for acute right-sided weakness with dizziness and headache. EXAM: MRI HEAD WITHOUT CONTRAST MRA HEAD WITHOUT CONTRAST TECHNIQUE: Multiplanar, multiecho pulse sequences of the brain and surrounding structures were obtained without intravenous contrast. Angiographic images of the head were obtained using MRA technique without contrast. COMPARISON:  Prior CT from earlier the same day. FINDINGS: MRI HEAD FINDINGS Study limited due to motion artifact and patient's inability to tolerate the full length of the exam. Diffusion-weighted sequences demonstrate no abnormal foci of restricted diffusion to suggest acute infarct. Gray-white matter differentiation grossly maintained. Major intracranial vascular flow voids are preserved. Mild atrophy with chronic small vessel ischemic disease. No definite mass lesion identified. No mass effect or midline shift. No hydrocephalus. No extra-axial fluid collection. Major dural sinuses are grossly patent. No definite acute abnormality  about the orbits. Sequela prior bilateral lens extraction noted. Moderate mucosal thickening within the right maxilla sinus and ethmoidal air cells on the right. No significant mastoid effusion. Bone marrow signal intensity grossly normal. No scalp soft tissue abnormality. MRA HEAD FINDINGS ANTERIOR CIRCULATION: Visualized distal cervical segments of the internal carotid arteries are patent with antegrade flow. Petrous, cavernous, and supraclinoid segments patent without flow limiting stenosis. A1 segments, anterior communicating artery, and anterior cerebral arteries patent bilaterally. M1 segments patent without stenosis or occlusion. MCA bifurcations normal. Distal MCA branches symmetric and fairly well opacified bilaterally. POSTERIOR CIRCULATION: Vertebral arteries patent to the vertebrobasilar junction. Right vertebral artery dominant. Posterior inferior cerebral arteries patent proximally. Basilar artery patent to its distal aspect. Superior cerebellar arteries well opacified bilaterally. Hypoplastic left P1 segment with prominent left posterior communicating artery. Small right posterior communicating artery noted as well. PCAs well opacified to their distal aspects. No aneurysm. IMPRESSION: MRI HEAD IMPRESSION: 1. Limited study due to  motion artifact and patient's inability to tolerate the full length of the exam. 2. No acute intracranial infarct. No definite other acute intracranial process identified. 3. Mild atrophy with chronic small vessel ischemic disease. MRA HEAD IMPRESSION: Negative intracranial MRA. No large or proximal arterial branch occlusion. No high-grade or correctable stenosis. Electronically Signed   By: Jeannine Boga M.D.   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 Right-sided rib deformities are stable. Left lung remains clear. Upper normal heart size. No pneumothorax. IMPRESSION: No active cardiopulmonary disease.  Postop changes are noted. Electronically Signed   By: Marybelle Killings M.D.   On: 12/07/2015 11:53   [name] Head/brain Wo Cm  12/05/2015  CLINICAL DATA:  Initial evaluation for acute right-sided weakness with dizziness and headache. EXAM: MRI HEAD WITHOUT CONTRAST MRA HEAD WITHOUT CONTRAST TECHNIQUE: Multiplanar, multiecho pulse sequences of the brain and surrounding structures were obtained without intravenous contrast. Angiographic images of the head were obtained using MRA technique without contrast. COMPARISON:  Prior CT from earlier the same day. FINDINGS: MRI HEAD FINDINGS Study limited due to motion artifact and patient's inability to tolerate the full length of the exam. Diffusion-weighted sequences demonstrate no abnormal foci of restricted diffusion to suggest acute infarct. Gray-white matter differentiation grossly maintained. Major intracranial vascular flow voids are preserved. Mild atrophy with chronic small vessel ischemic disease. No definite mass lesion identified. No mass effect or midline shift. No hydrocephalus. No extra-axial fluid collection. Major dural sinuses are grossly patent. No definite acute abnormality about the orbits. Sequela prior bilateral lens extraction noted. Moderate mucosal thickening within the right maxilla sinus and ethmoidal air cells on the right. No significant mastoid effusion. Bone marrow signal intensity grossly normal. No scalp soft tissue abnormality. MRA HEAD FINDINGS ANTERIOR CIRCULATION: Visualized distal cervical segments of the internal carotid arteries are patent with antegrade flow. Petrous, cavernous, and supraclinoid segments patent without flow limiting stenosis. A1 segments, anterior communicating artery, and anterior cerebral arteries patent bilaterally. M1 segments patent without  stenosis or occlusion. MCA bifurcations normal. Distal MCA branches symmetric and fairly well opacified bilaterally. POSTERIOR CIRCULATION: Vertebral arteries patent to the vertebrobasilar junction. Right vertebral artery dominant. Posterior inferior cerebral arteries patent proximally. Basilar artery patent to its distal aspect. Superior cerebellar arteries well opacified bilaterally. Hypoplastic left P1 segment with prominent left posterior communicating artery. Small right posterior communicating artery noted as well. PCAs well opacified to their distal aspects. No aneurysm. IMPRESSION: MRI HEAD IMPRESSION: 1. Limited study due to motion artifact and patient's inability to tolerate the full length of the exam. 2. No acute intracranial infarct. No definite other acute intracranial process identified. 3. Mild atrophy with chronic small vessel ischemic disease. MRA HEAD IMPRESSION: Negative intracranial MRA. No large or proximal arterial branch occlusion. No high-grade or correctable stenosis. Electronically Signed   By: Jeannine Boga M.D.   On: 12/05/2015 23:55    Microbiology: Recent Results (from the past 240 hour(s))  Urine culture     Status: None (Preliminary result)   Collection Time: 12/05/15  7:47 PM  Result Value Ref Range Status   Specimen Description URINE, RANDOM  Final   Special Requests NONE  Final   Culture TOO YOUNG TO READ  Final   Report Status PENDING  Incomplete  Labs: Basic Metabolic Panel:  Recent Labs Lab 12/05/15 1653  NA 135  K 3.5  CL 103  CO2 24  GLUCOSE 78  BUN 6  CREATININE 0.88  CALCIUM 9.9  MG 1.9   Liver Function Tests: No results for input(s): AST, ALT, ALKPHOS, BILITOT, PROT, ALBUMIN in the last 168 hours. No results for input(s): LIPASE, AMYLASE in the last 168 hours. No results for input(s): AMMONIA in the last 168 hours. CBC:  Recent Labs Lab 12/05/15 1653  WBC 8.0  NEUTROABS 3.3  HGB 12.0*  HCT 35.5*  MCV 101.1*  PLT 270    Cardiac Enzymes: No results for input(s): CKTOTAL, CKMB, CKMBINDEX, TROPONINI in the last 168 hours. BNP: BNP (last 3 results) No results for input(s): BNP in the last 8760 hours.  ProBNP (last 3 results) No results for input(s): PROBNP in the last 8760 hours.  CBG:  Recent Labs Lab 12/06/15 1647 12/06/15 1959 12/06/15 2250 12/07/15 0648 12/07/15 1145  GLUCAP 106* 79 107* 197* 227*       Signed:  Louellen Molder MD.  [facility] 12/07/2015, 12:16 PM

## 2015-12-07 NOTE — Discharge Instructions (Signed)
Transient Ischemic Attack °A transient ischemic attack (TIA) is a "warning stroke" that causes stroke-like symptoms. Unlike a stroke, a TIA does not cause permanent damage to the brain. The symptoms of a TIA can happen very fast and do not last long. It is important to know the symptoms of a TIA and what to do. This can help prevent a major stroke or death. °CAUSES  °A TIA is caused by a temporary blockage in an artery in the brain or neck (carotid artery). The blockage does not allow the brain to get the blood supply it needs and can cause different symptoms. The blockage can be caused by either: °· A blood clot. °· Fatty buildup (plaque) in a neck or brain artery. °RISK FACTORS °· High blood pressure (hypertension). °· High cholesterol. °· Diabetes mellitus. °· Heart disease. °· The buildup of plaque in the blood vessels (peripheral artery disease or atherosclerosis). °· The buildup of plaque in the blood vessels that provide blood and oxygen to the brain (carotid artery stenosis). °· An abnormal heart rhythm (atrial fibrillation). °· Obesity. °· Using any tobacco products, including cigarettes, chewing tobacco, or electronic cigarettes. °· Taking oral contraceptives, especially in combination with using tobacco. °· Physical inactivity. °· A diet high in fats, salt (sodium), and calories. °· Excessive alcohol use. °· Use of illegal drugs (especially cocaine and methamphetamine). °· Being male. °· Being African American. °· Being over the age of 55 years. °· Family history of stroke. °· Previous history of blood clots, stroke, TIA, or heart attack. °· Sickle cell disease. °SIGNS AND SYMPTOMS  °TIA symptoms are the same as a stroke but are temporary. These symptoms usually develop suddenly, or may be newly present upon waking from sleep: °· Sudden weakness or numbness of the face, arm, or leg, especially on one side of the body. °· Sudden trouble walking or difficulty moving arms or legs. °· Sudden  confusion. °· Sudden personality changes. °· Trouble speaking (aphasia) or understanding. °· Difficulty swallowing. °· Sudden trouble seeing in one or both eyes. °· Double vision. °· Dizziness. °· Loss of balance or coordination. °· Sudden severe headache with no known cause. °· Trouble reading or writing. °· Loss of bowel or bladder control. °· Loss of consciousness. °DIAGNOSIS  °Your health care provider may be able to determine the presence or absence of a TIA based on your symptoms, history, and physical exam. CT scan of the brain is usually performed to help identify a TIA. Other tests may include: °· Electrocardiography (ECG). °· Continuous heart monitoring. °· Echocardiography. °· Carotid ultrasonography. °· MRI. °· A scan of the brain circulation. °· Blood tests. °TREATMENT  °Since the symptoms of TIA are the same as a stroke, it is important to seek treatment as soon as possible. You may need a medicine to dissolve a blood clot (thrombolytic) if that is the cause of the TIA. This medicine cannot be given if too much time has passed. Treatment may also include:  °· Rest, oxygen, fluids through an IV tube, and medicines to thin the blood (anticoagulants). °· Measures will be taken to prevent short-term and long-term complications, including infection from breathing foreign material into the lungs (aspiration pneumonia), blood clots in the legs, and falls. °· Procedures to either remove plaque in the carotid arteries or dilate carotid arteries that have narrowed due to plaque. Those procedures are: °¨ Carotid endarterectomy. °¨ Carotid angioplasty and stenting. °· Medicines and diet may be used to address diabetes, high blood pressure, and   other underlying risk factors. °HOME CARE INSTRUCTIONS  °· Take medicines only as directed by your health care provider. Follow the directions carefully. Medicines may be used to control risk factors for a stroke. Be sure you understand all your medicine instructions. °· You  may be told to take aspirin or the anticoagulant warfarin. Warfarin needs to be taken exactly as instructed. °¨ Taking too much or too little warfarin is dangerous. Too much warfarin increases the risk of bleeding. Too little warfarin continues to allow the risk for blood clots. While taking warfarin, you will need to have regular blood tests to measure your blood clotting time. A PT blood test measures how long it takes for blood to clot. Your PT is used to calculate another value called an INR. Your PT and INR help your health care provider to adjust your dose of warfarin. The dose can change for many reasons. It is critically important that you take warfarin exactly as prescribed. °¨ Many foods, especially foods high in vitamin K can interfere with warfarin and affect the PT and INR. Foods high in vitamin K include spinach, kale, broccoli, cabbage, collard and turnip greens, Brussels sprouts, peas, cauliflower, seaweed, and parsley, as well as beef and pork liver, green tea, and soybean oil. You should eat a consistent amount of foods high in vitamin K. Avoid major changes in your diet, or notify your health care provider before changing your diet. Arrange a visit with a dietitian to answer your questions. °¨ Many medicines can interfere with warfarin and affect the PT and INR. You must tell your health care provider about any and all medicines you take; this includes all vitamins and supplements. Be especially cautious with aspirin and anti-inflammatory medicines. Do not take or discontinue any prescribed or over-the-counter medicine except on the advice of your health care provider or pharmacist. °¨ Warfarin can have side effects, such as excessive bruising or bleeding. You will need to hold pressure over cuts for longer than usual. Your health care provider or pharmacist will discuss other potential side effects. °¨ Avoid sports or activities that may cause injury or bleeding. °¨ Be careful when shaving,  flossing your teeth, or handling sharp objects. °¨ Alcohol can change the body's ability to handle warfarin. It is best to avoid alcoholic drinks or consume only very small amounts while taking warfarin. Notify your health care provider if you change your alcohol intake. °¨ Notify your dentist or other health care providers before procedures. °· Eat a diet that includes 5 or more servings of fruits and vegetables each day. This may reduce the risk of stroke. Certain diets may be prescribed to address high blood pressure, high cholesterol, diabetes, or obesity. °¨ A diet low in sodium, saturated fat, trans fat, and cholesterol is recommended to manage high blood pressure. °¨ A diet low in saturated fat, trans fat, and cholesterol, and high in fiber may control cholesterol levels. °¨ A controlled-carbohydrate, controlled-sugar diet is recommended to manage diabetes. °¨ A reduced-calorie diet that is low in sodium, saturated fat, trans fat, and cholesterol is recommended to manage obesity. °· Maintain a healthy weight. °· Stay physically active. It is recommended that you get at least 30 minutes of activity on most or all days. °· Do not use any tobacco products, including cigarettes, chewing tobacco, or electronic cigarettes. If you need help quitting, ask your health care provider. °· Limit alcohol intake to no more than 1 drink per day for nonpregnant women and 2 drinks   per day for men. One drink equals 12 ounces of beer, 5 ounces of wine, or 1½ ounces of hard liquor. °· Do not abuse drugs. °· A safe home environment is important to reduce the risk of falls. Your health care provider may arrange for specialists to evaluate your home. Having grab bars in the bedroom and bathroom is often important. Your health care provider may arrange for equipment to be used at home, such as raised toilets and a seat for the shower. °· Follow all instructions for follow-up with your health care provider. This is very important.  This includes any referrals and lab tests. Proper follow-up can prevent a stroke or another TIA from occurring. °PREVENTION  °The risk of a TIA can be decreased by appropriately treating high blood pressure, high cholesterol, diabetes, heart disease, and obesity, and by quitting smoking, limiting alcohol, and staying physically active. °SEEK MEDICAL CARE IF: °· You have personality changes. °· You have difficulty swallowing. °· You are seeing double. °· You have dizziness. °· You have a fever. °SEEK IMMEDIATE MEDICAL CARE IF:  °Any of the following symptoms may represent a serious problem that is an emergency. Do not wait to see if the symptoms will go away. Get medical help right away. Call your local emergency services (911 in [place] Do not drive yourself to the hospital. °· You have sudden weakness or numbness of the face, arm, or leg, especially on one side of the body. °· You have sudden trouble walking or difficulty moving arms or legs. °· You have sudden confusion. °· You have trouble speaking (aphasia) or understanding. °· You have sudden trouble seeing in one or both eyes. °· You have a loss of balance or coordination. °· You have a sudden, severe headache with no known cause. °· You have new chest pain or an irregular heartbeat. °· You have a partial or total loss of consciousness. °MAKE SURE YOU:  °· Understand these instructions. °· Will watch your condition. °· Will get help right away if you are not doing well or get worse. °  °This information is not intended to replace advice given to you by your health care provider. Make sure you discuss any questions you have with your health care provider. °  °Document Released: 06/11/2005 Document Revised: 09/22/2014 Document Reviewed: 12/07/2013 °Elsevier Interactive Patient Education ©2016 [employer] ° °

## 2015-12-07 NOTE — Progress Notes (Signed)
Discharge instructions reviewed with patient/family. RX given. All questions answered at this time. Transport home by family.   Nicholaus Steinke, RN 

## 2015-12-09 LAB — EYE CULTURE

## 2015-12-10 NOTE — Addendum Note (Signed)
Addended by: Wyatt Haste on: 12/10/2015 07:40 AM   Modules accepted: Miquel Dunn

## 2015-12-11 ENCOUNTER — Ambulatory Visit: Payer: Medicare Other | Attending: Pain Medicine | Admitting: Pain Medicine

## 2015-12-11 ENCOUNTER — Encounter: Payer: Self-pay | Admitting: Pain Medicine

## 2015-12-11 VITALS — BP 152/57 | HR 81 | Temp 98.8°F | Resp 18 | Ht 73.0 in | Wt 280.0 lb

## 2015-12-11 DIAGNOSIS — M533 Sacrococcygeal disorders, not elsewhere classified: Secondary | ICD-10-CM | POA: Diagnosis not present

## 2015-12-11 DIAGNOSIS — M4806 Spinal stenosis, lumbar region: Secondary | ICD-10-CM | POA: Diagnosis not present

## 2015-12-11 DIAGNOSIS — Z89519 Acquired absence of unspecified leg below knee: Secondary | ICD-10-CM | POA: Insufficient documentation

## 2015-12-11 DIAGNOSIS — M5136 Other intervertebral disc degeneration, lumbar region: Secondary | ICD-10-CM | POA: Diagnosis not present

## 2015-12-11 DIAGNOSIS — M2578 Osteophyte, vertebrae: Secondary | ICD-10-CM | POA: Insufficient documentation

## 2015-12-11 DIAGNOSIS — Z981 Arthrodesis status: Secondary | ICD-10-CM | POA: Diagnosis not present

## 2015-12-11 DIAGNOSIS — M5134 Other intervertebral disc degeneration, thoracic region: Secondary | ICD-10-CM

## 2015-12-11 DIAGNOSIS — M51369 Other intervertebral disc degeneration, lumbar region without mention of lumbar back pain or lower extremity pain: Secondary | ICD-10-CM

## 2015-12-11 DIAGNOSIS — M5124 Other intervertebral disc displacement, thoracic region: Secondary | ICD-10-CM | POA: Diagnosis not present

## 2015-12-11 DIAGNOSIS — G588 Other specified mononeuropathies: Secondary | ICD-10-CM | POA: Diagnosis not present

## 2015-12-11 DIAGNOSIS — G546 Phantom limb syndrome with pain: Secondary | ICD-10-CM | POA: Diagnosis not present

## 2015-12-11 DIAGNOSIS — R52 Pain, unspecified: Secondary | ICD-10-CM

## 2015-12-11 DIAGNOSIS — M791 Myalgia: Secondary | ICD-10-CM | POA: Diagnosis not present

## 2015-12-11 DIAGNOSIS — M19011 Primary osteoarthritis, right shoulder: Secondary | ICD-10-CM

## 2015-12-11 DIAGNOSIS — M5126 Other intervertebral disc displacement, lumbar region: Secondary | ICD-10-CM | POA: Diagnosis not present

## 2015-12-11 DIAGNOSIS — N200 Calculus of kidney: Secondary | ICD-10-CM

## 2015-12-11 DIAGNOSIS — M5416 Radiculopathy, lumbar region: Secondary | ICD-10-CM | POA: Diagnosis not present

## 2015-12-11 DIAGNOSIS — M47817 Spondylosis without myelopathy or radiculopathy, lumbosacral region: Secondary | ICD-10-CM | POA: Diagnosis not present

## 2015-12-11 DIAGNOSIS — Z89511 Acquired absence of right leg below knee: Secondary | ICD-10-CM

## 2015-12-11 DIAGNOSIS — M48062 Spinal stenosis, lumbar region with neurogenic claudication: Secondary | ICD-10-CM

## 2015-12-11 DIAGNOSIS — M79606 Pain in leg, unspecified: Secondary | ICD-10-CM | POA: Diagnosis present

## 2015-12-11 DIAGNOSIS — M545 Low back pain: Secondary | ICD-10-CM | POA: Diagnosis present

## 2015-12-11 MED ORDER — OXYCODONE HCL 5 MG PO TABS: ORAL_TABLET | ORAL | Status: DC

## 2015-12-11 MED ORDER — OXYCODONE HCL 15 MG PO TABS: ORAL_TABLET | ORAL | Status: DC

## 2015-12-11 NOTE — Patient Instructions (Addendum)
PLAN   Continue present medication oxycodone. PLEASE NOTE that you have a 15 mg oxycodone which should be seen-year-old and only opened for extreme pain. DO NOT confuse with the 5 mg size oxycodone.  F/U PCP Dr. Carlota Raspberry for evaliation of  BP and general medical  condition.   F/U surgical evaluation. May consider pending follow-up evaluations  F/U cardiologist as discussed  F/U neurological evaluation for questionable TIA versus CVA  May consider PNCV/EMG studies and other studies pending follow-up evaluations  F/U urological evaluation and surgery as planned  May consider radiofrequency rhizolysis or intraspinal procedures pending response to present treatment and F/U evaluation.. We will avoid such procedures at this time  Patient to call [facility] should patient have concerns prior to scheduled return appointment

## 2015-12-11 NOTE — Progress Notes (Signed)
Safety precautions to be maintained throughout the outpatient stay will include: orient to surroundings, keep bed in low position, maintain call bell within reach at all times, provide assistance with transfer out of bed and ambulation.  Pt was in Hospital last week to rule out stroke- did not have one - states everything is good
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
## 2015-12-18 ENCOUNTER — Encounter (HOSPITAL_COMMUNITY): Payer: Self-pay | Admitting: *Deleted

## 2015-12-18 ENCOUNTER — Other Ambulatory Visit: Payer: Self-pay | Admitting: Radiology

## 2015-12-18 NOTE — Progress Notes (Addendum)
12/04/15- Stress Test- [facility] - Clearance under CV Procedures in Stress Test for surgery  12/05/15- PT,PTT and U/A and culture in EPIC and CBC/DIFF/BMp and Magnesium in EPIC  12/06/15- HGA1C- EPIC  12/06/15- EYE Culture- EPIC  12/07/15- [place]  12/09/15- EKG-EPIC  LOV- Cardiology- 12/03/15- EPIC

## 2015-12-19 ENCOUNTER — Ambulatory Visit (HOSPITAL_COMMUNITY)
Admission: RE | Admit: 2015-12-19 | Discharge: 2015-12-19 | Disposition: A | Discharge status: Home / Self Care | Payer: Medicare Other | Source: Ambulatory Visit | Attending: Urology | Admitting: Urology

## 2015-12-19 ENCOUNTER — Encounter (HOSPITAL_COMMUNITY): Payer: Self-pay

## 2015-12-19 ENCOUNTER — Other Ambulatory Visit: Payer: Self-pay | Admitting: Family Medicine

## 2015-12-19 DIAGNOSIS — G9341 Metabolic encephalopathy: Secondary | ICD-10-CM | POA: Diagnosis not present

## 2015-12-19 DIAGNOSIS — A419 Sepsis, unspecified organism: Secondary | ICD-10-CM | POA: Diagnosis not present

## 2015-12-19 DIAGNOSIS — N200 Calculus of kidney: Secondary | ICD-10-CM | POA: Diagnosis not present

## 2015-12-19 DIAGNOSIS — T814XXA Infection following a procedure, initial encounter: Secondary | ICD-10-CM | POA: Diagnosis not present

## 2015-12-19 DIAGNOSIS — J151 Pneumonia due to Pseudomonas: Secondary | ICD-10-CM | POA: Diagnosis not present

## 2015-12-19 DIAGNOSIS — N12 Tubulo-interstitial nephritis, not specified as acute or chronic: Secondary | ICD-10-CM | POA: Diagnosis not present

## 2015-12-19 DIAGNOSIS — N10 Acute tubulo-interstitial nephritis: Secondary | ICD-10-CM | POA: Diagnosis not present

## 2015-12-19 DIAGNOSIS — R509 Fever, unspecified: Secondary | ICD-10-CM | POA: Diagnosis not present

## 2015-12-19 DIAGNOSIS — K449 Diaphragmatic hernia without obstruction or gangrene: Secondary | ICD-10-CM | POA: Diagnosis not present

## 2015-12-19 LAB — BASIC METABOLIC PANEL
Anion gap: 9 (ref 5–15)
BUN: 10 mg/dL (ref 6–20)
CALCIUM: 9.7 mg/dL (ref 8.9–10.3)
CO2: 24 mmol/L (ref 22–32)
CREATININE: 0.9 mg/dL (ref 0.61–1.24)
Chloride: 104 mmol/L (ref 101–111)
GFR calc non Af Amer: >60 mL/min (ref >60)
Glucose, Bld: 349 mg/dL — ABNORMAL HIGH (ref 65–99)
Potassium: 4.2 mmol/L (ref 3.5–5.1)
SODIUM: 137 mmol/L (ref 135–145)

## 2015-12-19 LAB — CBC WITH DIFFERENTIAL/PLATELET
BASOS PCT: 0 %
Basophils Absolute: 0 10*3/uL (ref 0.0–0.1)
EOS ABS: 0.1 10*3/uL (ref 0.0–0.7)
EOS PCT: 1 %
HCT: 37.1 % — ABNORMAL LOW (ref 39.0–52.0)
HEMOGLOBIN: 12.3 g/dL — AB (ref 13.0–17.0)
Lymphocytes Relative: 24 %
Lymphs Abs: 2.5 10*3/uL (ref 0.7–4.0)
MCH: 34 pg (ref 26.0–34.0)
MCHC: 33.2 g/dL (ref 30.0–36.0)
MCV: 102.5 fL — ABNORMAL HIGH (ref 78.0–100.0)
MONOS PCT: 9 %
Monocytes Absolute: 0.9 10*3/uL (ref 0.1–1.0)
NEUTROS PCT: 66 %
Neutro Abs: 6.7 10*3/uL (ref 1.7–7.7)
PLATELETS: 385 10*3/uL (ref 150–400)
RBC: 3.62 MIL/uL — ABNORMAL LOW (ref 4.22–5.81)
RDW: 15.9 % — AB (ref 11.5–15.5)
WBC: 10.2 10*3/uL (ref 4.0–10.5)

## 2015-12-19 LAB — GLUCOSE, CAPILLARY
GLUCOSE-CAPILLARY: 312 mg/dL — AB (ref 65–99)
Glucose-Capillary: 337 mg/dL — ABNORMAL HIGH (ref 65–99)
Glucose-Capillary: 289 mg/dL — ABNORMAL HIGH (ref 65–99)

## 2015-12-19 LAB — PROTIME-INR
INR: 1.09 (ref 0.00–1.49)
PROTHROMBIN TIME: 13.8 s (ref 11.6–15.2)

## 2015-12-19 LAB — APTT: aPTT: 28 seconds (ref 24–37)

## 2015-12-19 MED ORDER — CIPROFLOXACIN IN D5W 400 MG/200ML IV SOLN
400.0000 mg | Freq: Once | INTRAVENOUS | Status: AC
  Administered 2015-12-19: 400 mg via INTRAVENOUS

## 2015-12-19 MED ORDER — LIDOCAINE HCL 1 % IJ SOLN
INTRAMUSCULAR | Status: AC
  Filled 2015-12-19: qty 20

## 2015-12-19 MED ORDER — FENTANYL CITRATE (PF) 100 MCG/2ML IJ SOLN
INTRAMUSCULAR | Status: AC | PRN
  Administered 2015-12-19 (×4): 25 ug via INTRAVENOUS

## 2015-12-19 MED ORDER — ONDANSETRON HCL 4 MG/2ML IJ SOLN
INTRAMUSCULAR | Status: AC
  Filled 2015-12-19: qty 2

## 2015-12-19 MED ORDER — LIDOCAINE HCL 1 % IJ SOLN
INTRAMUSCULAR | Status: AC | PRN
  Administered 2015-12-19: 10 mL

## 2015-12-19 MED ORDER — MIDAZOLAM HCL 2 MG/2ML IJ SOLN
INTRAMUSCULAR | Status: AC | PRN
  Administered 2015-12-19 (×3): 0.5 mg via INTRAVENOUS
  Administered 2015-12-19: 1 mg via INTRAVENOUS

## 2015-12-19 MED ORDER — FENTANYL CITRATE (PF) 100 MCG/2ML IJ SOLN
INTRAMUSCULAR | Status: AC
  Filled 2015-12-19: qty 4

## 2015-12-19 MED ORDER — DEXTROSE 5 % IV SOLN
3.0000 g | INTRAVENOUS | Status: DC
  Filled 2015-12-19: qty 3000

## 2015-12-19 MED ORDER — CIPROFLOXACIN IN D5W 400 MG/200ML IV SOLN
INTRAVENOUS | Status: AC
  Filled 2015-12-19: qty 200

## 2015-12-19 MED ORDER — MIDAZOLAM HCL 2 MG/2ML IJ SOLN
INTRAMUSCULAR | Status: AC
  Filled 2015-12-19: qty 6

## 2015-12-19 MED ORDER — IOPAMIDOL (ISOVUE-300) INJECTION 61%
25.0000 mL | Freq: Once | INTRAVENOUS | Status: AC | PRN
  Administered 2015-12-19: 25 mL

## 2015-12-19 MED ORDER — ONDANSETRON HCL 4 MG/2ML IJ SOLN
4.0000 mg | Freq: Once | INTRAMUSCULAR | Status: AC
  Administered 2015-12-19: 4 mg via INTRAVENOUS

## 2015-12-19 MED ORDER — SODIUM CHLORIDE 0.9 % IV SOLN
INTRAVENOUS | Status: DC
  Administered 2015-12-19: 08:00:00 via INTRAVENOUS

## 2015-12-19 NOTE — Sedation Documentation (Signed)
Patient denies pain and is resting comfortably.  
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
## 2015-12-19 NOTE — Progress Notes (Addendum)
Patient had preop on 11/27/2015 for surgery on 12/03/15.  Surgery then rescheduled for 12/20/2015.  Patient in hospital at [facility] from 12/05/15-12/07/2015 - see Discharge Summary in EPIC.  On 12/18/2015 I reviewed Allergies, Medications and Medical History and updated from 11/27/2015.  Patient was on Augmentin from 12/07/2015- 12/12/2015.  Unsure of dose.   Also preop instructions with patient.   Latest labs in EPIC on 12/19/15 to include PT,Ptt, CBC and BMP

## 2015-12-19 NOTE — Discharge Instructions (Signed)
Percutaneous Nephrostomy, Care After Refer to this sheet in the next few weeks. These instructions provide you with information on caring for yourself after your procedure. Your health care provider may also give you more specific instructions. Your treatment has been planned according to current medical practices, but problems sometimes occur. Call your health care provider if you have any problems or questions after your procedure. WHAT TO EXPECT AFTER THE PROCEDURE You will need to remain lying down for several hours. HOME CARE INSTRUCTIONS  Your nephrostomy tube is connected to a leg bag or bedside drainage bag. Always keep the tubing, the leg bag, or the bedside drainage bags below the level of the kidney so that the urine drains freely.  During the day, if you are connecting the nephrostomy tube to a leg bag, be sure there are no kinks in the tubing and that the urine is draining freely.  At night, you may want to connect the nephrostomy tube or the leg bag to a larger bedside drainage bag.  Change the dressing as often as directed by your health care provider, or if it becomes wet.  Gently remove the tapes and dressing from around the nephrostomy tube. Be careful not to pull on the tube while removing the dressing.  Wash the skin around the tube, rinse well, and dry.  Place two split drain sponges in and around the tube exit site.  Place tape around edge of the dressing.  Secure the nephrostomy tubing. Remember to make certain that the nephrostomy tube does not kink or become pinched closed. It can be useful to wrap any exposed tubing going from the nephrostomy tube to any of the connecting tubes to either the leg bag or drainage bag with an elastic bandage.  Every three weeks, replace the leg bag, drainage bag, and any extension tubing connected to your nephrostomy tube. Your health care provider will explain how to change the drainage bag and extension tubing. SEEK MEDICAL CARE  IF:  You experience any problems with any of the valves or tubing.  You have persistent pain or soreness in your back.  You have a fever or chills. SEEK IMMEDIATE MEDICAL CARE IF:  You have abdominal pain during the first week.  You have a new appearance of blood in your urine.  You have back pain that is not relieved by your medicine.  You have drainage, redness, swelling, or pain at the tube insertion site.  You have decreased urine output.  Your nephrostomy tube comes out.   This information is not intended to replace advice given to you by your health care provider. Make sure you discuss any questions you have with your health care provider.   Document Released: 04/24/2004 Document Revised: 09/22/2014 Document Reviewed: 04/28/2013 Elsevier Interactive Patient Education 2016 [employer] Percutaneous Nephrostomy Home Guide A nephrostomy tube allows urine to leave your body when a medical condition prevents it from leaving your kidney normally. Urine is normally carried from the kidneys to the bladder through narrow tubes called ureters. The ureter can become obstructed due to conditions such as kidney stones, tumors, infection, or blood clots. A nephrostomy tube is a hollow, flexible tube placed into the kidney to restore the flow of urine. The tube is placed on the right or left side of your lower back and is connected to an external drainage bag. PERSONAL HYGIENE  You may shower unless otherwise told by your health care provider. Prepare for a shower by placing a plastic covering over  the nephrostomy tube dressing.  Change the dressing immediately after showering. Make sure your skin around the nephrostomy tube exit site is dry.  Avoid immersing your nephrostomy tube in water, such as taking a bath or swimming. NEPHROSTOMY TUBE CARE  Your nephrostomy tube is connected to a leg bag or bedside drainage bag. Always keep your tubing, leg bag, or bedside drainage bag below the  level of your kidney so that your urine drains freely.  Your activity is not restricted as long as your activity does not pull or tug on your tube.  During the day, if you are connecting your nephrostomy tube to a leg bag, ensure that your tubing does not have any kinks and that your urine is draining freely. One helpful technique to prevent kinking or inadvertent dislodging of your nephrostomy tubing is to gently wrap an elastic bandage over the tubing. This will help to secure the tubing in place. Make sure there is no tension on the tubing so it does not become dislodged.  At night, you may want to connect your nephrostomy tube to a larger bedside drainage bag. EMPTYING THE LEG BAG OR BEDSIDE DRAINAGE BAG  The leg bag or bedside drainage bag should be emptied when it becomes  full and before going to sleep. Most leg bags and bedside drainage bags have a drain at the bottom that allows urine to be emptied.  Hold the leg bag or bedside drainage bag over a toilet or collection container. Use a measuring container if you are directed to measure your urine.  Open the drain and allow the urine to drain.  Once all the urine is drained from the leg bag or bedside drainage bag, close the drain fully to avoid urine leakage.  Flush urine down toilet. If a collection container was used, rinse the container. NEPHROSTOMY TUBE EXIT SITE CARE The exit site for your nephrostomy tube is covered with a bandage (dressing). Clean your exit site and change your dressing as directed by your health care provider or if your dressing becomes wet. Supplies Needed:  Mild soap and water.  44 inch (10x10 cm) split gauze pads.  44 inch (10x10 cm) gauze pads.  Paper tape. Exit Site Care and Dressing Change:For the first 2 weeks after having a nephrostomy tube inserted, you should change the dressing every day. After 2 weeks, you can change the dressing 2 times per week, whenever the dressing becomes wet, or as told  by your health care provider. Because of the location of your nephrostomy tube, you may need help from another person to complete dressing changes. The steps to changing a dressing are: 1. Wash hands well with soap and water. 2. Gently remove the tapes and dressing from around the nephrostomy tube. Be careful not to pull on the tube while removing the dressing. Avoid using scissors to remove the dressing since this may lead to accidental damage to the tube. 3. Wash the skin around the tube with the mild soap and water, rinse well, and dry with a clean cloth. 4. Inspect the skin around the drain for redness, swelling, and foul-smelling yellow or green discharge. 5. If the drain was sutured to the skin, inspect the suture to verify that it is still anchored in the skin. 6. Place two split gauze pads in and around the tube exit site. Do not apply ointments or alcohol to the site. 7. Place a gauze pad on top of the split gauze pad. 8. Coil the tube on  top of the gauze. The tubing should rest on the gauze, not on the skin. 9. Place tape around each edge of the gauze pad. 10. Secure the nephrostomy tubing. Ensure that the nephrostomy tube does not kink or become pinched. The tubing should rest on the gauze pad and not on the skin. 11. Dispose of used supplies properly. FLUSHING YOUR NEPHROSTOMY TUBE  Flush your nephrostomy tube as directed by your health care provider. Flushing of a nephrostomy tube is easier if a three-way stopcock is placed between the tube and the leg bag or bedside drainage bag. One connection of the three-way stopcock connects to your tube, the second connects to the leg bag or bedside drainage bag, and the third connection is usually covered with a cap. The three-way stopcock lever points to the direction on the stopcock that is closed to flow. Normally, the lever points in the direction of the cap to allow urine to drain from the tube to the leg bag or bedside drainage bag. Supplies  Needed:  Rubbing alcohol wipe.  10 mL 0.9% saline syringe. Flushing Your Nephrostomy Tube 1. Gather needed supplies. 2. Move the lever of the three-way stopcock so that it points toward the leg bag or bedside drainage bag. 3. Clean the cap with a rubbing alcohol wipe and then screw the tip of a 10 mL 0.9% saline syringe onto the cap. 4. Using the syringe plunger, slowly push the 10 mL 0.9% saline in the syringe over 5-10 seconds. If resistance is met or pain occurs while pushing, stop pushing the saline immediately. 5. Remove the syringe from the cap. 6. Return the stopcock lever to the usual position which is pointing in the direction of the cap. 7. Dispose of used supplies properly. REPLACING YOUR LEG BAG OR BEDSIDE DRAINAGE BAG  Replace your leg bag or bedside drainage bag, three-way stopcock, and any extension tubing as directed by your health care provider. Make sure you always have an extra drainage bag and connecting tubing available. 1. Empty urine from your leg bag or bedside drainage bag. 2. Gather new leg bag or bedside drainage bag, three-way stopcock, and any extension tubing. 3. Remove the leg bag or bedside drainage bag, three-way stopcock, and any extension tubing from the nephrostomy tube. 4. Attach the new leg bag or bedside drainage bag, three-way stopcock, and any extension tubing to the nephrostomy tube. 5. Dispose of the used leg bag or bedside drainage bag, three-way stopcock, and any extension tubing from the nephrostomy tube. SEEK IMMEDIATE MEDICAL CARE IF:  You have a fever.  You have abdominal pain during the first week after nephrostomy tube insertion.  You have new appearance of blood in your urine.  You have drainage, redness, swelling or pain at your nephrostomy tube insertion site.  You have difficulty or pain with flushing the tube.  You notice a decrease in your urine output not explained by drinking less fluids.  Your nephrostomy tube comes out or  the suture securing the tube comes free.   This information is not intended to replace advice given to you by your health care provider. Make sure you discuss any questions you have with your health care provider.   Document Released: 06/22/2013 Document Revised: 09/06/2013 Document Reviewed: 06/22/2013 Elsevier Interactive Patient Education 2016 Elsevier Inc. Percutaneous Nephrostomy Percutaneous nephrostomy is the insertion of a flexible tube into your kidney through your back. This is done to provide access to an obstructed kidney. The goal of this procedure is to  allow the urine that is produced in the kidney to drain, which will relieve pressure or infection from damaging your kidney. This will allow your health care provider to identify the cause of the obstruction and plan appropriate treatment. LET [facility] CARE PROVIDER KNOW ABOUT:  Any allergies you have.  All medicines you are taking, including vitamins, herbs, eye drops, creams, and over-the-counter medicines.  Previous problems you or members of your family have had with the use of anesthetics.  Any blood disorders you have.  Previous surgeries you have had.  Medical conditions you have.  Possibility of pregnancy, if this applies. RISKS AND COMPLICATIONS Generally, this is a safe procedure. However, as with any procedure, problems can occur. Possible problems include:  Infection.  Damage to the organs surrounding your kidney. BEFORE THE PROCEDURE Your health care provider may want you to have blood tests. These tests can help tell how well your kidneys and liver are working. They can also show how well your blood clots. If you take anticoagulant medicine, sometimes called blood thinners, ask your health care provider when you should stop taking them. Make arrangements for someone to take you home after the procedure, if needed. PROCEDURE The procedure is performed as follows:  An intravenous IV catheter will be  inserted into one of the veins in your arm. Medicine will be able to flow directly into your body through this catheter. You may be given medicines through this tube to help prevent nausea and pain, and antibiotics to help prevent infection.   You will be placed on your stomach and given medicine that numbs the site (local anesthetic) where the percutaneous nephrostomy tube will be inserted.  You will be given a medicine that makes you go to sleep (general anesthetic).  The percutaneous nephrostomy tube, which is thin and flexible, will be inserted into a needle.  The needle will be inserted into your body and guided to your kidney with the help of an imaging method that uses X-ray images (fluoroscopy).  A dye will be injected through the nephrostomy tube. Then, X-ray images that highlight your kidney will be taken.  The needle is then removed, but the nephrostomy tube will be left in your kidney. The tube will drain urine from your kidney to a collection bag outside your body. The tube is usually secured to your skin with stitches (sutures). AFTER THE PROCEDURE   You will stay in a recovery area until the sedation has worn off. Your blood pressure and pulse will be checked.  You will need to remain lying down for several hours.   This information is not intended to replace advice given to you by your health care provider. Make sure you discuss any questions you have with your health care provider.   Document Released: 06/22/2013 Document Revised: 09/22/2014 Document Reviewed: 06/22/2013 Elsevier Interactive Patient Education 2016 [employer]. Moderate Conscious Sedation, Adult, Care After Refer to this sheet in the next few weeks. These instructions provide you with information on caring for yourself after your procedure. Your health care provider may also give you more specific instructions. Your treatment has been planned according to current medical practices, but problems sometimes  occur. Call your health care provider if you have any problems or questions after your procedure. WHAT TO EXPECT AFTER THE PROCEDURE  After your procedure:  You may feel sleepy, clumsy, and have poor balance for several hours.  Vomiting may occur if you eat too soon after the procedure. HOME CARE INSTRUCTIONS  Do not participate in any activities where you could become injured for at least 24 hours. Do not:  Drive.  Swim.  Ride a bicycle.  Operate heavy machinery.  Cook.  Use power tools.  Climb ladders.  Work from a high place.  Do not make important decisions or sign legal documents until you are improved.  If you vomit, drink water, juice, or soup when you can drink without vomiting. Make sure you have little or no nausea before eating solid foods.  Only take over-the-counter or prescription medicines for pain, discomfort, or fever as directed by your health care provider.  Make sure you and your family fully understand everything about the medicines given to you, including what side effects may occur.  You should not drink alcohol, take sleeping pills, or take medicines that cause drowsiness for at least 24 hours.  If you smoke, do not smoke without supervision.  If you are feeling better, you may resume normal activities 24 hours after you were sedated.  Keep all appointments with your health care provider. SEEK MEDICAL CARE IF:  Your skin is pale or bluish in color.  You continue to feel nauseous or vomit.  Your pain is getting worse and is not helped by medicine.  You have bleeding or swelling.  You are still sleepy or feeling clumsy after 24 hours. SEEK IMMEDIATE MEDICAL CARE IF:  You develop a rash.  You have difficulty breathing.  You develop any type of allergic problem.  You have a fever. MAKE SURE YOU: 12. Understand these instructions. 13. Will watch your condition. 14. Will get help right away if you are not doing well or get worse.     This information is not intended to replace advice given to you by your health care provider. Make sure you discuss any questions you have with your health care provider.   Document Released: 06/22/2013 Document Revised: 09/22/2014 Document Reviewed: 06/22/2013 Elsevier Interactive Patient Education [employer].

## 2015-12-19 NOTE — Procedures (Signed)
Interventional Radiology Procedure Note  Procedure:  Image guided left PCN, with placement of left 42F multi-purpose catheter through the collecting system to the bladder.  Secured with prolene suture. Capped.  Complications: None Recommendations:  - Surgery tomorrow.  - Ok to shower tomorrow - Do not submerge - Capped until am. - Routine care   Signed,  Dulcy Fanny. Earleen Newport, DO

## 2015-12-19 NOTE — Progress Notes (Signed)
Abnormal blood sugars called to PA Rowe Robert at 0749am 0923am and 1121am. Upon discharge, Pt and wife agree to treat blood sugar with home dose insulin. Pt alert and stable.

## 2015-12-20 ENCOUNTER — Other Ambulatory Visit: Payer: Self-pay | Admitting: Family Medicine

## 2015-12-20 ENCOUNTER — Encounter (HOSPITAL_COMMUNITY): Payer: Self-pay | Admitting: *Deleted

## 2015-12-20 ENCOUNTER — Other Ambulatory Visit: Payer: Self-pay | Admitting: Radiology

## 2015-12-20 ENCOUNTER — Observation Stay (HOSPITAL_COMMUNITY)
Admission: RE | Admit: 2015-12-20 | Discharge: 2015-12-21 | Disposition: A | Discharge status: Home / Self Care | Payer: Medicare Other | Source: Ambulatory Visit | Attending: Urology | Admitting: Urology

## 2015-12-20 ENCOUNTER — Ambulatory Visit (HOSPITAL_COMMUNITY): Payer: Medicare Other

## 2015-12-20 ENCOUNTER — Encounter (HOSPITAL_COMMUNITY)
Admission: RE | Disposition: A | Discharge status: Home / Self Care | Payer: Self-pay | Source: Ambulatory Visit | Attending: Urology

## 2015-12-20 ENCOUNTER — Ambulatory Visit (HOSPITAL_COMMUNITY): Payer: Medicare Other | Admitting: Certified Registered Nurse Anesthetist

## 2015-12-20 DIAGNOSIS — Z794 Long term (current) use of insulin: Secondary | ICD-10-CM

## 2015-12-20 DIAGNOSIS — Z9049 Acquired absence of other specified parts of digestive tract: Secondary | ICD-10-CM | POA: Insufficient documentation

## 2015-12-20 DIAGNOSIS — Z89511 Acquired absence of right leg below knee: Secondary | ICD-10-CM | POA: Insufficient documentation

## 2015-12-20 DIAGNOSIS — R109 Unspecified abdominal pain: Secondary | ICD-10-CM | POA: Insufficient documentation

## 2015-12-20 DIAGNOSIS — Z79899 Other long term (current) drug therapy: Secondary | ICD-10-CM

## 2015-12-20 DIAGNOSIS — E7800 Pure hypercholesterolemia, unspecified: Secondary | ICD-10-CM | POA: Insufficient documentation

## 2015-12-20 DIAGNOSIS — G4730 Sleep apnea, unspecified: Secondary | ICD-10-CM | POA: Insufficient documentation

## 2015-12-20 DIAGNOSIS — E109 Type 1 diabetes mellitus without complications: Secondary | ICD-10-CM | POA: Insufficient documentation

## 2015-12-20 DIAGNOSIS — Z903 Acquired absence of stomach [part of]: Secondary | ICD-10-CM | POA: Insufficient documentation

## 2015-12-20 DIAGNOSIS — A419 Sepsis, unspecified organism: Secondary | ICD-10-CM | POA: Diagnosis not present

## 2015-12-20 DIAGNOSIS — N200 Calculus of kidney: Secondary | ICD-10-CM | POA: Insufficient documentation

## 2015-12-20 DIAGNOSIS — T814XXA Infection following a procedure, initial encounter: Secondary | ICD-10-CM | POA: Diagnosis not present

## 2015-12-20 DIAGNOSIS — K219 Gastro-esophageal reflux disease without esophagitis: Secondary | ICD-10-CM | POA: Insufficient documentation

## 2015-12-20 DIAGNOSIS — I10 Essential (primary) hypertension: Secondary | ICD-10-CM | POA: Insufficient documentation

## 2015-12-20 DIAGNOSIS — G9341 Metabolic encephalopathy: Secondary | ICD-10-CM | POA: Diagnosis not present

## 2015-12-20 DIAGNOSIS — Z87891 Personal history of nicotine dependence: Secondary | ICD-10-CM | POA: Insufficient documentation

## 2015-12-20 DIAGNOSIS — Z7982 Long term (current) use of aspirin: Secondary | ICD-10-CM

## 2015-12-20 DIAGNOSIS — J151 Pneumonia due to Pseudomonas: Secondary | ICD-10-CM | POA: Diagnosis not present

## 2015-12-20 HISTORY — PX: NEPHROLITHOTOMY: SHX5134

## 2015-12-20 LAB — GLUCOSE, CAPILLARY
GLUCOSE-CAPILLARY: 195 mg/dL — AB (ref 65–99)
GLUCOSE-CAPILLARY: 310 mg/dL — AB (ref 65–99)
Glucose-Capillary: 126 mg/dL — ABNORMAL HIGH (ref 65–99)
Glucose-Capillary: 269 mg/dL — ABNORMAL HIGH (ref 65–99)

## 2015-12-20 LAB — CBC
HCT: 34.9 % — ABNORMAL LOW (ref 39.0–52.0)
HEMOGLOBIN: 11.7 g/dL — AB (ref 13.0–17.0)
MCH: 34.4 pg — ABNORMAL HIGH (ref 26.0–34.0)
MCHC: 33.5 g/dL (ref 30.0–36.0)
MCV: 102.6 fL — ABNORMAL HIGH (ref 78.0–100.0)
Platelets: 371 10*3/uL (ref 150–400)
RBC: 3.4 MIL/uL — ABNORMAL LOW (ref 4.22–5.81)
RDW: 15.8 % — ABNORMAL HIGH (ref 11.5–15.5)
WBC: 11.4 10*3/uL — ABNORMAL HIGH (ref 4.0–10.5)

## 2015-12-20 LAB — BASIC METABOLIC PANEL
Anion gap: 9 (ref 5–15)
BUN: 11 mg/dL (ref 6–20)
CALCIUM: 9.1 mg/dL (ref 8.9–10.3)
CHLORIDE: 102 mmol/L (ref 101–111)
CO2: 25 mmol/L (ref 22–32)
CREATININE: 0.98 mg/dL (ref 0.61–1.24)
GFR calc non Af Amer: >60 mL/min (ref >60)
Glucose, Bld: 266 mg/dL — ABNORMAL HIGH (ref 65–99)
Potassium: 4 mmol/L (ref 3.5–5.1)
SODIUM: 136 mmol/L (ref 135–145)

## 2015-12-20 SURGERY — NEPHROLITHOTOMY PERCUTANEOUS
Anesthesia: General | Laterality: Left

## 2015-12-20 MED ORDER — FENTANYL CITRATE (PF) 100 MCG/2ML IJ SOLN
25.0000 ug | INTRAMUSCULAR | Status: DC | PRN
  Administered 2015-12-20 (×2): 50 ug via INTRAVENOUS
  Filled 2015-12-20 (×2): qty 2

## 2015-12-20 MED ORDER — PROPOFOL 10 MG/ML IV BOLUS
INTRAVENOUS | Status: AC
  Filled 2015-12-20: qty 20

## 2015-12-20 MED ORDER — LACTATED RINGERS IV SOLN
INTRAVENOUS | Status: DC
  Administered 2015-12-20: 13:00:00 via INTRAVENOUS

## 2015-12-20 MED ORDER — LACTATED RINGERS IV SOLN
INTRAVENOUS | Status: DC | PRN
  Administered 2015-12-20: 10:00:00 via INTRAVENOUS

## 2015-12-20 MED ORDER — SUGAMMADEX SODIUM 500 MG/5ML IV SOLN
INTRAVENOUS | Status: AC
  Filled 2015-12-20: qty 5

## 2015-12-20 MED ORDER — SIMVASTATIN 40 MG PO TABS
40.0000 mg | ORAL_TABLET | Freq: Every day | ORAL | Status: DC
  Administered 2015-12-20: 40 mg via ORAL
  Filled 2015-12-20: qty 1

## 2015-12-20 MED ORDER — ONDANSETRON HCL 4 MG/2ML IJ SOLN
INTRAMUSCULAR | Status: DC | PRN
  Administered 2015-12-20: 4 mg via INTRAVENOUS

## 2015-12-20 MED ORDER — INSULIN REGULAR HUMAN (CONC) 500 UNIT/ML ~~LOC~~ SOPN
25.0000 [IU] | PEN_INJECTOR | Freq: Every day | SUBCUTANEOUS | Status: DC
  Administered 2015-12-20: 70 [IU] via SUBCUTANEOUS

## 2015-12-20 MED ORDER — OXYCODONE HCL 5 MG PO TABS
30.0000 mg | ORAL_TABLET | ORAL | Status: DC | PRN
  Administered 2015-12-20 – 2015-12-21 (×5): 30 mg via ORAL
  Filled 2015-12-20 (×5): qty 6

## 2015-12-20 MED ORDER — ROCURONIUM BROMIDE 100 MG/10ML IV SOLN
INTRAVENOUS | Status: AC
  Filled 2015-12-20: qty 1

## 2015-12-20 MED ORDER — INSULIN REGULAR HUMAN (CONC) 500 UNIT/ML ~~LOC~~ SOPN: 25.0000 [IU] | PEN_INJECTOR | Freq: Every day | SUBCUTANEOUS | Status: DC

## 2015-12-20 MED ORDER — LABETALOL HCL 5 MG/ML IV SOLN
INTRAVENOUS | Status: AC
  Filled 2015-12-20: qty 4

## 2015-12-20 MED ORDER — PROMETHAZINE HCL 25 MG PO TABS: 12.5000 mg | ORAL_TABLET | Freq: Four times a day (QID) | ORAL | Status: DC | PRN

## 2015-12-20 MED ORDER — FENTANYL CITRATE (PF) 100 MCG/2ML IJ SOLN
INTRAMUSCULAR | Status: AC
  Filled 2015-12-20: qty 2

## 2015-12-20 MED ORDER — CEFTRIAXONE SODIUM 2 G IJ SOLR
INTRAMUSCULAR | Status: AC
  Filled 2015-12-20: qty 2

## 2015-12-20 MED ORDER — DIPHENHYDRAMINE HCL 50 MG/ML IJ SOLN
12.5000 mg | Freq: Four times a day (QID) | INTRAMUSCULAR | Status: DC | PRN
  Administered 2015-12-20 – 2015-12-21 (×2): 25 mg via INTRAVENOUS
  Filled 2015-12-20 (×2): qty 1

## 2015-12-20 MED ORDER — MIDAZOLAM HCL 2 MG/2ML IJ SOLN
INTRAMUSCULAR | Status: AC
  Filled 2015-12-20: qty 2

## 2015-12-20 MED ORDER — SUGAMMADEX SODIUM 200 MG/2ML IV SOLN
INTRAVENOUS | Status: DC | PRN
  Administered 2015-12-20: 300 mg via INTRAVENOUS

## 2015-12-20 MED ORDER — ASPIRIN EC 81 MG PO TBEC
81.0000 mg | DELAYED_RELEASE_TABLET | Freq: Every morning | ORAL | Status: DC
  Administered 2015-12-20 – 2015-12-21 (×2): 81 mg via ORAL
  Filled 2015-12-20 (×2): qty 1

## 2015-12-20 MED ORDER — INSULIN ASPART 100 UNIT/ML ~~LOC~~ SOLN: 0.0000 [IU] | SUBCUTANEOUS | Status: DC

## 2015-12-20 MED ORDER — MIDAZOLAM HCL 5 MG/5ML IJ SOLN
INTRAMUSCULAR | Status: DC | PRN
  Administered 2015-12-20: 2 mg via INTRAVENOUS

## 2015-12-20 MED ORDER — PROPOFOL 10 MG/ML IV BOLUS
INTRAVENOUS | Status: DC | PRN
  Administered 2015-12-20: 200 mg via INTRAVENOUS

## 2015-12-20 MED ORDER — ONDANSETRON HCL 4 MG/2ML IJ SOLN
4.0000 mg | INTRAMUSCULAR | Status: DC | PRN
Start: 2015-12-20 — End: 2015-12-21

## 2015-12-20 MED ORDER — INSULIN REGULAR HUMAN (CONC) 500 UNIT/ML ~~LOC~~ SOPN
25.0000 [IU] | PEN_INJECTOR | Freq: Every day | SUBCUTANEOUS | Status: DC
  Administered 2015-12-21: 55 [IU] via SUBCUTANEOUS

## 2015-12-20 MED ORDER — LABETALOL HCL 5 MG/ML IV SOLN
INTRAVENOUS | Status: DC | PRN
  Administered 2015-12-20 (×2): 2.5 mg via INTRAVENOUS
  Administered 2015-12-20: 5 mg via INTRAVENOUS

## 2015-12-20 MED ORDER — ZOLPIDEM TARTRATE 5 MG PO TABS: 5.0000 mg | ORAL_TABLET | Freq: Every evening | ORAL | Status: DC | PRN

## 2015-12-20 MED ORDER — CEFAZOLIN SODIUM 1 G IJ SOLR: 1.0000 g | INTRAMUSCULAR | Status: DC

## 2015-12-20 MED ORDER — INSULIN ASPART 100 UNIT/ML ~~LOC~~ SOLN: 0.0000 [IU] | Freq: Three times a day (TID) | SUBCUTANEOUS | Status: DC

## 2015-12-20 MED ORDER — DEXTROSE 5 % IV SOLN
2.0000 g | INTRAVENOUS | Status: AC
  Administered 2015-12-20: 2 g via INTRAVENOUS

## 2015-12-20 MED ORDER — INSULIN REGULAR HUMAN (CONC) 500 UNIT/ML ~~LOC~~ SOPN
25.0000 [IU] | PEN_INJECTOR | Freq: Every day | SUBCUTANEOUS | Status: DC
  Administered 2015-12-21: 80 [IU] via SUBCUTANEOUS

## 2015-12-20 MED ORDER — LIDOCAINE HCL (CARDIAC) 20 MG/ML IV SOLN
INTRAVENOUS | Status: AC
  Filled 2015-12-20: qty 5

## 2015-12-20 MED ORDER — FENTANYL CITRATE (PF) 100 MCG/2ML IJ SOLN
INTRAMUSCULAR | Status: DC | PRN
  Administered 2015-12-20 (×2): 50 ug via INTRAVENOUS
  Administered 2015-12-20: 100 ug via INTRAVENOUS
  Administered 2015-12-20: 50 ug via INTRAVENOUS

## 2015-12-20 MED ORDER — HYDROMORPHONE HCL 1 MG/ML IJ SOLN
1.0000 mg | INTRAMUSCULAR | Status: DC | PRN
  Administered 2015-12-20 (×3): 1 mg via INTRAVENOUS
  Filled 2015-12-20 (×3): qty 1

## 2015-12-20 MED ORDER — TRAZODONE HCL 100 MG PO TABS
100.0000 mg | ORAL_TABLET | Freq: Every day | ORAL | Status: DC
  Administered 2015-12-20: 100 mg via ORAL
  Filled 2015-12-20: qty 1

## 2015-12-20 MED ORDER — PREGABALIN 50 MG PO CAPS
100.0000 mg | ORAL_CAPSULE | Freq: Three times a day (TID) | ORAL | Status: DC
  Administered 2015-12-20 – 2015-12-21 (×3): 100 mg via ORAL
  Filled 2015-12-20 (×3): qty 2

## 2015-12-20 MED ORDER — LIDOCAINE HCL (CARDIAC) 20 MG/ML IV SOLN
INTRAVENOUS | Status: DC | PRN
  Administered 2015-12-20: 100 mg via INTRAVENOUS

## 2015-12-20 MED ORDER — ONDANSETRON HCL 4 MG/2ML IJ SOLN
INTRAMUSCULAR | Status: AC
  Filled 2015-12-20: qty 2

## 2015-12-20 MED ORDER — ROCURONIUM BROMIDE 100 MG/10ML IV SOLN
INTRAVENOUS | Status: DC | PRN
  Administered 2015-12-20: 50 mg via INTRAVENOUS

## 2015-12-20 MED ORDER — FENTANYL CITRATE (PF) 100 MCG/2ML IJ SOLN
25.0000 ug | INTRAMUSCULAR | Status: DC | PRN
  Administered 2015-12-20 (×2): 50 ug via INTRAVENOUS

## 2015-12-20 MED ORDER — SUCCINYLCHOLINE CHLORIDE 20 MG/ML IJ SOLN
INTRAMUSCULAR | Status: DC | PRN
  Administered 2015-12-20: 100 mg via INTRAVENOUS

## 2015-12-20 MED ORDER — FENTANYL CITRATE (PF) 250 MCG/5ML IJ SOLN
INTRAMUSCULAR | Status: AC
  Filled 2015-12-20: qty 5

## 2015-12-20 MED ORDER — METOCLOPRAMIDE HCL 5 MG/ML IJ SOLN: 10.0000 mg | Freq: Once | INTRAMUSCULAR | Status: DC | PRN

## 2015-12-20 MED ORDER — MEPERIDINE HCL 50 MG/ML IJ SOLN: 6.2500 mg | INTRAMUSCULAR | Status: DC | PRN

## 2015-12-20 MED ORDER — PANTOPRAZOLE SODIUM 40 MG PO TBEC
40.0000 mg | DELAYED_RELEASE_TABLET | Freq: Every day | ORAL | Status: DC
  Administered 2015-12-21: 40 mg via ORAL
  Filled 2015-12-20 (×2): qty 1

## 2015-12-20 MED ORDER — BELLADONNA ALKALOIDS-OPIUM 16.2-60 MG RE SUPP: 1.0000 | Freq: Four times a day (QID) | RECTAL | Status: DC | PRN

## 2015-12-20 MED ORDER — DIPHENHYDRAMINE HCL 12.5 MG/5ML PO ELIX: 12.5000 mg | ORAL_SOLUTION | Freq: Four times a day (QID) | ORAL | Status: DC | PRN

## 2015-12-20 MED ORDER — SODIUM CHLORIDE 0.9 % IV SOLN
INTRAVENOUS | Status: DC
  Administered 2015-12-20 – 2015-12-21 (×2): via INTRAVENOUS

## 2015-12-20 MED ORDER — LORAZEPAM 0.5 MG PO TABS: 0.5000 mg | ORAL_TABLET | Freq: Every evening | ORAL | Status: DC | PRN

## 2015-12-20 MED ORDER — SODIUM CHLORIDE 0.9 % IR SOLN
Status: DC | PRN
  Administered 2015-12-20: 3000 mL
  Administered 2015-12-20: 1000 mL
  Administered 2015-12-20 (×2): 3000 mL

## 2015-12-20 MED ORDER — FUROSEMIDE 40 MG PO TABS
40.0000 mg | ORAL_TABLET | Freq: Every day | ORAL | Status: DC
  Administered 2015-12-20 – 2015-12-21 (×2): 40 mg via ORAL
  Filled 2015-12-20 (×2): qty 1

## 2015-12-20 SURGICAL SUPPLY — 69 items
APL SKNCLS STERI-STRIP NONHPOA (GAUZE/BANDAGES/DRESSINGS) ×4
BAG URINE DRAINAGE (UROLOGICAL SUPPLIES) ×8 IMPLANT
BAG URO CATCHER STRL LF (MISCELLANEOUS) ×4 IMPLANT
BASKET STONE NITINOL 3FRX115MB (UROLOGICAL SUPPLIES) IMPLANT
BASKET ZERO TIP NITINOL 2.4FR (BASKET) ×3 IMPLANT
BENZOIN TINCTURE PRP APPL 2/3 (GAUZE/BANDAGES/DRESSINGS) ×8 IMPLANT
BLADE SURG 15 STRL LF DISP TIS (BLADE) ×2 IMPLANT
BLADE SURG 15 STRL SS (BLADE) ×4
BSKT STON RTRVL ZERO TP 2.4FR (BASKET) ×2
CATCHER STONE W/TUBE ADAPTER (UROLOGICAL SUPPLIES) IMPLANT
CATH FOLEY 2W COUNCIL 20FR 5CC (CATHETERS) IMPLANT
CATH FOLEY 2W COUNCIL 5CC 18FR (CATHETERS) ×3 IMPLANT
CATH FOLEY 2WAY SLVR  5CC 16FR (CATHETERS) ×2
CATH FOLEY 2WAY SLVR  5CC 18FR (CATHETERS) ×2
CATH FOLEY 2WAY SLVR 5CC 16FR (CATHETERS) ×2 IMPLANT
CATH FOLEY 2WAY SLVR 5CC 18FR (CATHETERS) ×2 IMPLANT
CATH IMAGER II 65CM (CATHETERS) ×4 IMPLANT
CATH INTERMIT  6FR 70CM (CATHETERS) ×4 IMPLANT
CATH ROBINSON RED A/P 20FR (CATHETERS) IMPLANT
CATH URET DUAL LUMEN 6-10FR 50 (CATHETERS) IMPLANT
CATH X-FORCE N30 NEPHROSTOMY (TUBING) ×4 IMPLANT
CLOTH BEACON ORANGE TIMEOUT ST (SAFETY) ×4 IMPLANT
COVER SURGICAL LIGHT HANDLE (MISCELLANEOUS) ×4 IMPLANT
DRAPE C-ARM 42X120 X-RAY (DRAPES) ×4 IMPLANT
DRAPE LINGEMAN PERC (DRAPES) ×4 IMPLANT
DRAPE SHEET LG 3/4 BI-LAMINATE (DRAPES) ×4 IMPLANT
DRAPE SURG IRRIG POUCH 19X23 (DRAPES) ×4 IMPLANT
DRSG PAD ABDOMINAL 8X10 ST (GAUZE/BANDAGES/DRESSINGS) ×8 IMPLANT
DRSG TEGADERM 8X12 (GAUZE/BANDAGES/DRESSINGS) ×8 IMPLANT
FIBER LASER FLEXIVA 1000 (UROLOGICAL SUPPLIES) IMPLANT
FIBER LASER FLEXIVA 200 (UROLOGICAL SUPPLIES) IMPLANT
FIBER LASER FLEXIVA 365 (UROLOGICAL SUPPLIES) IMPLANT
FIBER LASER FLEXIVA 550 (UROLOGICAL SUPPLIES) IMPLANT
FIBER LASER TRAC TIP (UROLOGICAL SUPPLIES) IMPLANT
GAUZE SPONGE 4X4 12PLY STRL (GAUZE/BANDAGES/DRESSINGS) ×4 IMPLANT
GLOVE BIO SURGEON STRL SZ8 (GLOVE) ×12 IMPLANT
GLOVE BIOGEL M 8.0 STRL (GLOVE) ×4 IMPLANT
GLOVE BIOGEL PI IND STRL 8 (GLOVE) IMPLANT
GLOVE BIOGEL PI INDICATOR 8 (GLOVE)
GOWN STRL REUS W/TWL LRG LVL3 (GOWN DISPOSABLE) ×8 IMPLANT
GOWN STRL REUS W/TWL XL LVL3 (GOWN DISPOSABLE) ×4 IMPLANT
GUIDEWIRE AMPLAZ .035X145 (WIRE) ×4 IMPLANT
GUIDEWIRE STR DUAL SENSOR (WIRE) ×8 IMPLANT
INSERT SILICONE FOR G14464 (MISCELLANEOUS) ×4 IMPLANT
KIT BASIN OR (CUSTOM PROCEDURE TRAY) ×4 IMPLANT
MANIFOLD NEPTUNE II (INSTRUMENTS) ×4 IMPLANT
NDL TROCAR 18X15 ECHO (NEEDLE) IMPLANT
NDL TROCAR 18X20 (NEEDLE) IMPLANT
NEEDLE TROCAR 18X15 ECHO (NEEDLE) IMPLANT
NEEDLE TROCAR 18X20 (NEEDLE) IMPLANT
NS IRRIG 1000ML POUR BTL (IV SOLUTION) ×4 IMPLANT
PACK CYSTO (CUSTOM PROCEDURE TRAY) ×4 IMPLANT
PROBE LITHOCLAST ULTRA 3.8X403 (UROLOGICAL SUPPLIES) ×3 IMPLANT
PROBE PNEUMATIC 1.0MMX570MM (UROLOGICAL SUPPLIES) ×3 IMPLANT
SET AMPLATZ RENAL DILATOR (MISCELLANEOUS) IMPLANT
SET IRRIG Y TYPE TUR BLADDER L (SET/KITS/TRAYS/PACK) ×4 IMPLANT
SHEATH PEELAWAY SET 9 (SHEATH) ×3 IMPLANT
SPONGE LAP 4X18 X RAY DECT (DISPOSABLE) ×4 IMPLANT
STENT CONTOUR 6FRX26X.038 (STENTS) IMPLANT
STENT CONTOUR 6FRX28X.038 (STENTS) ×3 IMPLANT
STONE CATCHER W/TUBE ADAPTER (UROLOGICAL SUPPLIES) ×4 IMPLANT
SUT SILK 2 0 30  PSL (SUTURE) ×2
SUT SILK 2 0 30 PSL (SUTURE) ×2 IMPLANT
SYR 20CC LL (SYRINGE) ×8 IMPLANT
SYRINGE 10CC LL (SYRINGE) ×4 IMPLANT
TOWEL OR 17X26 10 PK STRL BLUE (TOWEL DISPOSABLE) ×4 IMPLANT
TUBING CONNECTING 10 (TUBING) ×6 IMPLANT
TUBING CONNECTING 10' (TUBING) ×2
WATER STERILE IRR 1500ML POUR (IV SOLUTION) ×4 IMPLANT

## 2015-12-20 NOTE — Transfer of Care (Signed)
Immediate Anesthesia Transfer of Care Note  Patient: [name] [facility]  Procedure(s) Performed: Procedure(s): NEPHROLITHOTOMY PERCUTANEOUS (Left)  Patient Location: PACU  Anesthesia Type:General  Level of Consciousness:  sedated, patient cooperative and responds to stimulation  Airway & Oxygen Therapy:Patient Spontanous Breathing and Patient connected to face mask oxgen  Post-op Assessment:  Report given to PACU RN and Post -op Vital signs reviewed and stable  Post vital signs:  Reviewed and stable  Last Vitals:  Filed Vitals:   12/20/15 0742  BP: 151/68  Pulse: 112  Temp: 37.9 C  Resp: 18    Complications: No apparent anesthesia complications

## 2015-12-20 NOTE — Progress Notes (Signed)
Dr. Marcell Barlow made aware of patient's CBG  Results- 195 in PACU

## 2015-12-20 NOTE — Progress Notes (Signed)
CBC and B met drawn by lab. 

## 2015-12-20 NOTE — Addendum Note (Signed)
Addendum  created 12/20/15 1339 by Maxwell Caul, CRNA   Modules edited: Charges VN

## 2015-12-20 NOTE — Anesthesia Preprocedure Evaluation (Signed)
Anesthesia Evaluation  Patient identified by MRN, date of birth, ID band Patient awake    Reviewed: Allergy & Precautions, NPO status , Patient's Chart, lab work & pertinent test results  Airway Mallampati: II  TM Distance: >3 FB Neck ROM: Full   Comment: Huge beard Dental no notable dental hx.    Pulmonary sleep apnea , former smoker,    Pulmonary exam normal breath sounds clear to auscultation       Cardiovascular hypertension, (-) CAD Normal cardiovascular exam Rhythm:Regular Rate:Normal     Neuro/Psych negative neurological ROS  negative psych ROS   GI/Hepatic negative GI ROS, Neg liver ROS,   Endo/Other  diabetes, Type 1, Insulin Dependent  Renal/GU negative Renal ROS  negative genitourinary   Musculoskeletal negative musculoskeletal ROS (+)   Abdominal   Peds negative pediatric ROS (+)  Hematology negative hematology ROS (+)   Anesthesia Other Findings   Reproductive/Obstetrics negative OB ROS                             Anesthesia Physical Anesthesia Plan  ASA: II  Anesthesia Plan: General   Post-op Pain Management:    Induction: Intravenous  Airway Management Planned: Oral ETT  Additional Equipment:   Intra-op Plan:   Post-operative Plan: Extubation in OR  Informed Consent: I have reviewed the patients History and Physical, chart, labs and discussed the procedure including the risks, benefits and alternatives for the proposed anesthesia with the patient or authorized representative who has indicated his/her understanding and acceptance.   Dental advisory given  Plan Discussed with: CRNA  Anesthesia Plan Comments:         Anesthesia Quick Evaluation

## 2015-12-20 NOTE — Progress Notes (Signed)
Inpatient Diabetes Program Recommendations  AACE/ADA: New Consensus Statement on Inpatient Glycemic Control (2015)  Target Ranges:  Prepandial:   less than 140 mg/dL      Peak postprandial:   less than 180 mg/dL (1-2 hours)      Critically ill patients:  140 - 180 mg/dL   Review of Glycemic Control  Diabetes history: DM2 Outpatient Diabetes medications: Current regimen includes: Humulin U-500 kiwipen 65+5 units BF, 45+5 lunch and 45+5 units supper (from Endo in 10/2015) Current orders for Inpatient glycemic control: U-500 25-105 units tidwc  Spoke with pt's wife about his home insulin regiment with U-500 insulin. Talked with endo's office and copied scale above from last OV. Wife has gone home to get his s/s which says how many units to give, according to blood sugar readings. Wife states blood sugars are usually < 200 mg/dL, but have been elevated recently from steroids.  Results for EMIGDIO, DUTY (MRN [id number]) as of 12/20/2015 15:38  Ref. Range 12/06/2015 04:50  Hemoglobin A1C Latest Ref Range: 4.8-5.6 % 7.3 (H)  Results for KIDD, ASCHENBRENNER (MRN [id number]) as of 12/20/2015 15:38  Ref. Range 12/19/2015 07:49 12/19/2015 09:23 12/19/2015 11:21 12/20/2015 07:37 12/20/2015 12:17  Glucose-Capillary Latest Ref Range: 65-99 mg/dL 337 (H) 289 (H) 312 (H) 126 (H) 195 (H)  Pt's MD office has message to call [facility] Pharmacist with s/s information. Wife has gone home to get this info. States "I can probably have it hear faster than the office." Discussed with RN.  Inpatient Diabetes Program Recommendations:    Change Novolog to resistant Q4H while poor po intake. Will likely need less U-500 than home dose.  Continue to follow. Thank you. Lorenda Peck, RD, LDN, CDE Inpatient Diabetes Coordinator [phone]

## 2015-12-20 NOTE — Anesthesia Postprocedure Evaluation (Signed)
Anesthesia Post Note  Patient: [name]  Procedure(s) Performed: Procedure(s) (LRB): NEPHROLITHOTOMY PERCUTANEOUS (Left)  Patient location during evaluation: PACU Anesthesia Type: General Level of consciousness: awake and alert Pain management: pain level controlled Vital Signs Assessment: post-procedure vital signs reviewed and stable Respiratory status: spontaneous breathing, nonlabored ventilation, respiratory function stable and patient connected to nasal cannula oxygen Cardiovascular status: blood pressure returned to baseline and stable Postop Assessment: no signs of nausea or vomiting Anesthetic complications: no    Last Vitals:  Filed Vitals:   12/20/15 1300 12/20/15 1315  BP: 157/67 151/67  Pulse: 87 86  Temp:  37.3 C  Resp: 14 13    Last Pain:  Filed Vitals:   12/20/15 1316  PainSc: 10-Worst pain ever                 [name]

## 2015-12-20 NOTE — Brief Op Note (Signed)
12/20/2015  11:53 AM  PATIENT:  [name]  65 y.o. male  PRE-OPERATIVE DIAGNOSIS:  BILATERAL RENAL CALCULI  POST-OPERATIVE DIAGNOSIS:  BILATERAL RENAL CALCULI  PROCEDURE:  Procedure(s): NEPHROLITHOTOMY PERCUTANEOUS (Left)  SURGEON:  Surgeon(s) and Role:    * Cleon Gustin, MD - Primary  PHYSICIAN ASSISTANT:   ASSISTANTS: none   ANESTHESIA:   general  EBL:  Total I/O In: -  Out: 200 [Urine:100; Blood:100]  BLOOD ADMINISTERED:none  DRAINS: Urinary Catheter (Foley), left 6x26 JJ ureteral stent, left 18 French nephrostomy tube  LOCAL MEDICATIONS USED:  NONE  SPECIMEN:  Source of Specimen:  left renal calculus  DISPOSITION OF SPECIMEN:  N/A  COUNTS:  YES  TOURNIQUET:  * No tourniquets in log *  DICTATION: .Note written in EPIC  PLAN OF CARE: Admit for overnight observation  PATIENT DISPOSITION:  PACU - hemodynamically stable.   Delay start of Pharmacological VTE agent (>24hrs) due to surgical blood loss or risk of bleeding: not applicable
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
## 2015-12-20 NOTE — Anesthesia Procedure Notes (Addendum)
Procedure Name: Intubation Date/Time: 12/20/2015 10:18 AM Performed by: Lajuana Carry E Pre-anesthesia Checklist: Patient identified, Emergency Drugs available, Suction available and Patient being monitored Patient Re-evaluated:Patient Re-evaluated prior to inductionOxygen Delivery Method: Circle System Utilized Preoxygenation: Pre-oxygenation with 100% oxygen Intubation Type: IV induction, Rapid sequence and Cricoid Pressure applied Tube type: Oral Tube size: 7.5 mm Number of attempts: 1 Airway Equipment and Method: Stylet Placement Confirmation: ETT inserted through vocal cords under direct vision,  positive ETCO2 and breath sounds checked- equal and bilateral Tube secured with: Tape Dental Injury: Teeth and Oropharynx as per pre-operative assessment  Comments: SRNA intubation.

## 2015-12-21 ENCOUNTER — Encounter (HOSPITAL_COMMUNITY): Payer: Self-pay | Admitting: *Deleted

## 2015-12-21 ENCOUNTER — Other Ambulatory Visit (HOSPITAL_COMMUNITY): Payer: Self-pay

## 2015-12-21 ENCOUNTER — Inpatient Hospital Stay (HOSPITAL_COMMUNITY)
Admission: EM | Admit: 2015-12-21 | Discharge: 2015-12-26 | DRG: 856 | Disposition: A | Discharge status: Home Health | Payer: Medicare Other | Attending: Internal Medicine | Admitting: Internal Medicine

## 2015-12-21 ENCOUNTER — Other Ambulatory Visit: Payer: Self-pay | Admitting: Radiology

## 2015-12-21 ENCOUNTER — Emergency Department (HOSPITAL_COMMUNITY): Payer: Medicare Other

## 2015-12-21 ENCOUNTER — Other Ambulatory Visit: Payer: Self-pay | Admitting: Urology

## 2015-12-21 ENCOUNTER — Encounter (HOSPITAL_COMMUNITY): Payer: Self-pay | Admitting: Nurse Practitioner

## 2015-12-21 ENCOUNTER — Other Ambulatory Visit: Payer: Self-pay

## 2015-12-21 DIAGNOSIS — Z6835 Body mass index (BMI) 35.0-35.9, adult: Secondary | ICD-10-CM

## 2015-12-21 DIAGNOSIS — F419 Anxiety disorder, unspecified: Secondary | ICD-10-CM | POA: Diagnosis present

## 2015-12-21 DIAGNOSIS — F32A Depression, unspecified: Secondary | ICD-10-CM | POA: Diagnosis present

## 2015-12-21 DIAGNOSIS — Z9111 Patient's noncompliance with dietary regimen: Secondary | ICD-10-CM

## 2015-12-21 DIAGNOSIS — G894 Chronic pain syndrome: Secondary | ICD-10-CM | POA: Diagnosis present

## 2015-12-21 DIAGNOSIS — J189 Pneumonia, unspecified organism: Secondary | ICD-10-CM | POA: Diagnosis present

## 2015-12-21 DIAGNOSIS — J151 Pneumonia due to Pseudomonas: Secondary | ICD-10-CM

## 2015-12-21 DIAGNOSIS — N12 Tubulo-interstitial nephritis, not specified as acute or chronic: Secondary | ICD-10-CM | POA: Diagnosis not present

## 2015-12-21 DIAGNOSIS — I10 Essential (primary) hypertension: Secondary | ICD-10-CM | POA: Diagnosis present

## 2015-12-21 DIAGNOSIS — I2510 Atherosclerotic heart disease of native coronary artery without angina pectoris: Secondary | ICD-10-CM | POA: Diagnosis present

## 2015-12-21 DIAGNOSIS — F329 Major depressive disorder, single episode, unspecified: Secondary | ICD-10-CM | POA: Diagnosis present

## 2015-12-21 DIAGNOSIS — M51379 Other intervertebral disc degeneration, lumbosacral region without mention of lumbar back pain or lower extremity pain: Secondary | ICD-10-CM | POA: Diagnosis present

## 2015-12-21 DIAGNOSIS — R509 Fever, unspecified: Secondary | ICD-10-CM | POA: Diagnosis not present

## 2015-12-21 DIAGNOSIS — K219 Gastro-esophageal reflux disease without esophagitis: Secondary | ICD-10-CM

## 2015-12-21 DIAGNOSIS — E1169 Type 2 diabetes mellitus with other specified complication: Secondary | ICD-10-CM | POA: Diagnosis present

## 2015-12-21 DIAGNOSIS — A419 Sepsis, unspecified organism: Secondary | ICD-10-CM | POA: Diagnosis present

## 2015-12-21 DIAGNOSIS — Z7982 Long term (current) use of aspirin: Secondary | ICD-10-CM

## 2015-12-21 DIAGNOSIS — Z8614 Personal history of Methicillin resistant Staphylococcus aureus infection: Secondary | ICD-10-CM

## 2015-12-21 DIAGNOSIS — Z87442 Personal history of urinary calculi: Secondary | ICD-10-CM

## 2015-12-21 DIAGNOSIS — Z87891 Personal history of nicotine dependence: Secondary | ICD-10-CM

## 2015-12-21 DIAGNOSIS — E11319 Type 2 diabetes mellitus with unspecified diabetic retinopathy without macular edema: Secondary | ICD-10-CM | POA: Diagnosis present

## 2015-12-21 DIAGNOSIS — K449 Diaphragmatic hernia without obstruction or gangrene: Secondary | ICD-10-CM | POA: Diagnosis present

## 2015-12-21 DIAGNOSIS — N200 Calculus of kidney: Secondary | ICD-10-CM | POA: Diagnosis not present

## 2015-12-21 DIAGNOSIS — T814XXA Infection following a procedure, initial encounter: Principal | ICD-10-CM | POA: Diagnosis present

## 2015-12-21 DIAGNOSIS — M5137 Other intervertebral disc degeneration, lumbosacral region: Secondary | ICD-10-CM | POA: Diagnosis present

## 2015-12-21 DIAGNOSIS — E119 Type 2 diabetes mellitus without complications: Secondary | ICD-10-CM | POA: Diagnosis present

## 2015-12-21 DIAGNOSIS — G4730 Sleep apnea, unspecified: Secondary | ICD-10-CM | POA: Diagnosis present

## 2015-12-21 DIAGNOSIS — N10 Acute tubulo-interstitial nephritis: Secondary | ICD-10-CM | POA: Diagnosis present

## 2015-12-21 DIAGNOSIS — N209 Urinary calculus, unspecified: Secondary | ICD-10-CM

## 2015-12-21 DIAGNOSIS — E669 Obesity, unspecified: Secondary | ICD-10-CM | POA: Diagnosis present

## 2015-12-21 DIAGNOSIS — Y95 Nosocomial condition: Secondary | ICD-10-CM | POA: Diagnosis present

## 2015-12-21 DIAGNOSIS — N202 Calculus of kidney with calculus of ureter: Secondary | ICD-10-CM | POA: Diagnosis present

## 2015-12-21 DIAGNOSIS — Z89511 Acquired absence of right leg below knee: Secondary | ICD-10-CM

## 2015-12-21 DIAGNOSIS — G9341 Metabolic encephalopathy: Secondary | ICD-10-CM | POA: Diagnosis present

## 2015-12-21 DIAGNOSIS — E210 Primary hyperparathyroidism: Secondary | ICD-10-CM | POA: Diagnosis present

## 2015-12-21 DIAGNOSIS — E162 Hypoglycemia, unspecified: Secondary | ICD-10-CM | POA: Diagnosis present

## 2015-12-21 DIAGNOSIS — E7800 Pure hypercholesterolemia, unspecified: Secondary | ICD-10-CM | POA: Diagnosis present

## 2015-12-21 DIAGNOSIS — Z936 Other artificial openings of urinary tract status: Secondary | ICD-10-CM

## 2015-12-21 LAB — COMPREHENSIVE METABOLIC PANEL
ALBUMIN: 3.8 g/dL (ref 3.5–5.0)
ALT: 16 U/L — ABNORMAL LOW (ref 17–63)
ANION GAP: 12 (ref 5–15)
AST: 19 U/L (ref 15–41)
Alkaline Phosphatase: 133 U/L — ABNORMAL HIGH (ref 38–126)
BUN: 12 mg/dL (ref 6–20)
CO2: 24 mmol/L (ref 22–32)
Calcium: 9.8 mg/dL (ref 8.9–10.3)
Chloride: 100 mmol/L — ABNORMAL LOW (ref 101–111)
Creatinine, Ser: 1 mg/dL (ref 0.61–1.24)
GFR calc Af Amer: >60 mL/min (ref >60)
GFR calc non Af Amer: >60 mL/min (ref >60)
Glucose, Bld: 168 mg/dL — ABNORMAL HIGH (ref 65–99)
POTASSIUM: 3.9 mmol/L (ref 3.5–5.1)
SODIUM: 136 mmol/L (ref 135–145)
TOTAL PROTEIN: 7.2 g/dL (ref 6.5–8.1)
Total Bilirubin: 1.5 mg/dL — ABNORMAL HIGH (ref 0.3–1.2)

## 2015-12-21 LAB — CBC WITH DIFFERENTIAL/PLATELET
BASOS ABS: 0 10*3/uL (ref 0.0–0.1)
Basophils Relative: 0 %
Eosinophils Absolute: 0 10*3/uL (ref 0.0–0.7)
Eosinophils Relative: 0 %
HEMATOCRIT: 37.8 % — AB (ref 39.0–52.0)
HEMOGLOBIN: 12.6 g/dL — AB (ref 13.0–17.0)
LYMPHS PCT: 9 %
Lymphs Abs: 1.3 10*3/uL (ref 0.7–4.0)
MCH: 34.1 pg — ABNORMAL HIGH (ref 26.0–34.0)
MCHC: 33.3 g/dL (ref 30.0–36.0)
MCV: 102.2 fL — AB (ref 78.0–100.0)
MONO ABS: 0.9 10*3/uL (ref 0.1–1.0)
Monocytes Relative: 7 %
NEUTROS ABS: 11 10*3/uL — AB (ref 1.7–7.7)
Neutrophils Relative %: 84 %
Platelets: 392 10*3/uL (ref 150–400)
RBC: 3.7 MIL/uL — ABNORMAL LOW (ref 4.22–5.81)
RDW: 15.6 % — AB (ref 11.5–15.5)
WBC: 13.2 10*3/uL — ABNORMAL HIGH (ref 4.0–10.5)

## 2015-12-21 LAB — CBC
HCT: 32.6 % — ABNORMAL LOW (ref 39.0–52.0)
HEMOGLOBIN: 10.8 g/dL — AB (ref 13.0–17.0)
MCH: 34.2 pg — AB (ref 26.0–34.0)
MCHC: 33.1 g/dL (ref 30.0–36.0)
MCV: 103.2 fL — AB (ref 78.0–100.0)
Platelets: 341 10*3/uL (ref 150–400)
RBC: 3.16 MIL/uL — ABNORMAL LOW (ref 4.22–5.81)
RDW: 15.6 % — ABNORMAL HIGH (ref 11.5–15.5)
WBC: 10.5 10*3/uL (ref 4.0–10.5)

## 2015-12-21 LAB — GLUCOSE, CAPILLARY
GLUCOSE-CAPILLARY: 194 mg/dL — AB (ref 65–99)
Glucose-Capillary: 246 mg/dL — ABNORMAL HIGH (ref 65–99)
Glucose-Capillary: 170 mg/dL — ABNORMAL HIGH (ref 65–99)

## 2015-12-21 LAB — BASIC METABOLIC PANEL
Anion gap: 7 (ref 5–15)
BUN: 10 mg/dL (ref 6–20)
CHLORIDE: 104 mmol/L (ref 101–111)
CO2: 29 mmol/L (ref 22–32)
CREATININE: 0.94 mg/dL (ref 0.61–1.24)
Calcium: 9.5 mg/dL (ref 8.9–10.3)
GFR calc Af Amer: >60 mL/min (ref >60)
GFR calc non Af Amer: >60 mL/min (ref >60)
GLUCOSE: 224 mg/dL — AB (ref 65–99)
Potassium: 4.6 mmol/L (ref 3.5–5.1)
SODIUM: 140 mmol/L (ref 135–145)

## 2015-12-21 LAB — I-STAT CG4 LACTIC ACID, ED: Lactic Acid, Venous: 1.44 mmol/L (ref 0.5–2.0)

## 2015-12-21 LAB — CBG MONITORING, ED: Glucose-Capillary: 137 mg/dL — ABNORMAL HIGH (ref 65–99)

## 2015-12-21 MED ORDER — SODIUM CHLORIDE 0.9 % IV BOLUS (SEPSIS)
1000.0000 mL | INTRAVENOUS | Status: AC
  Administered 2015-12-21 (×4): 1000 mL via INTRAVENOUS

## 2015-12-21 MED ORDER — CEFEPIME HCL 1 G IJ SOLR
1.0000 g | Freq: Two times a day (BID) | INTRAMUSCULAR | Status: DC
  Administered 2015-12-21: 1 g via INTRAVENOUS
  Filled 2015-12-21: qty 1

## 2015-12-21 MED ORDER — DIPHENHYDRAMINE-ZINC ACETATE 2-0.1 % EX CREA
TOPICAL_CREAM | CUTANEOUS | Status: DC | PRN
  Administered 2015-12-21: 04:00:00 via TOPICAL
  Filled 2015-12-21: qty 28

## 2015-12-21 MED ORDER — OXYCODONE HCL 30 MG PO TABS: 30.0000 mg | ORAL_TABLET | ORAL | Status: DC | PRN

## 2015-12-21 MED ORDER — ACETAMINOPHEN 650 MG RE SUPP
650.0000 mg | Freq: Once | RECTAL | Status: DC
  Filled 2015-12-21: qty 1

## 2015-12-21 NOTE — Progress Notes (Signed)
Pharmacy Antibiotic Note  [name] is a 65 y.o. male admitted on 12/21/2015 with UTI  Pharmacy has been consulted for cefepime dosing.  Plan: Cefepime 1gm IV q12h Follow cultures, clinical course     Temp (24hrs), Avg:100 F (37.8 C), Min:98.1 F (36.7 C), Max:104.4 F (40.2 C)   Recent Labs Lab 12/19/15 0745 12/20/15 1216 12/21/15 0446  WBC 10.2 11.4* 10.5  CREATININE 0.90 0.98 0.94    Estimated Creatinine Clearance: 112.7 mL/min (by C-G formula based on Cr of 0.94).    Allergies  Allergen Reactions  . Symbicort [Budesonide-Formoterol Fumarate] Other (See Comments)    Hyperglycemia   . Advair Diskus [Fluticasone-Salmeterol] Other (See Comments)    hyperglycemia  . Hydromorphone Nausea And Vomiting    Itching   . Morphine Itching  . Soap Itching and Other (See Comments)    Other Reaction: ivory soap=itching     Antimicrobials this admission: 4/7 cefepime Dose adjustments this admission:   Microbiology results: 4/7 blood x2: 4/7 urine:   Thank you for allowing pharmacy to be a part of this patient's care.  Dolly Rias RPh 12/21/2015, 9:14 PM Pager [phone]

## 2015-12-21 NOTE — ED Notes (Signed)
Pt has received 1000 mg of tylenol from emesis for fever. I plan to get a rectal temp to re-evalaute the fever.

## 2015-12-21 NOTE — Progress Notes (Signed)
72 ml of urine in bladder after bladder scanned. Patients foley was removed before day shift. MD paged.   Left nephrostomy drained 800 ml. Patient has no urge to void at this time.

## 2015-12-21 NOTE — Progress Notes (Signed)
Pt and spouse are c/o burning, itching and welts to R arm, back, and abdomen due to pain medication given. IV Benadryl given. Spouse called RN in room and stated that pt needs "something" for this. This RN paged on call Urology PA. Will continue to monitor pt and carry out plan of care. [name] I

## 2015-12-21 NOTE — ED Provider Notes (Signed)
Medical screening examination/treatment/procedure(s) were conducted as a shared visit with non-physician practitioner(s) and myself.  I personally evaluated the patient during the encounter.  ED ECG REPORT   Date: 12/21/2015  Rate: 137  Rhythm: sinus tachycardia  QRS Axis: normal  Intervals: normal  ST/T Wave abnormalities: nonspecific ST changes  Conduction Disutrbances:none  Narrative Interpretation:   Old EKG Reviewed: none available  I have personally reviewed the EKG tracing and agree with the computerized printout as noted.   Patient here with altered mental status from home. Recently had urological procedure done yesterday. He is febrile here and cannot cooperate with this exam. Maintaining his airway. Sepsis pathway started including fluids and antibiotics and will consult critical care   Lacretia Leigh, MD 12/21/15 2111

## 2015-12-21 NOTE — Care Management Obs Status (Signed)
[place] NOTIFICATION   Patient Details  Name: [name] MRN: [id number] Date of Birth: 15-Sep-1951   Medicare Observation Status Notification Given:       Dessa Phi, RN 12/21/2015, 10:38 AM

## 2015-12-21 NOTE — Progress Notes (Signed)
Dr. Alyson Ingles gave okay for patient to discharge to home without voiding after removal of foley. Patient has had 800 ml from left nephrostomy. Education provided to patient and wife. Additional supplied given to wife for reinforcement of the nephrostomy tube dressing. Urinal given. Prescription given. No additional questions. Follow-up appointment for Monday.

## 2015-12-21 NOTE — Progress Notes (Signed)
Pt and spouse refusing bed alarm at this time. RN educated patient and spouse regarding safety and that pt is at increased risk for falls due to surgery, medications, etc. Pt and spouse still refusing bed alarm. Will continue to monitor pt closely. [name] I
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
## 2015-12-21 NOTE — ED Notes (Signed)
Bed: [id number] Expected date:  Expected time:  Means of arrival:  Comments: Fever 103.2 tachy

## 2015-12-21 NOTE — ED Notes (Signed)
Dr. Jeffie Pollock called ahead about this pt.  Per Dr. Jeffie Pollock he had percutaneous nephrolithotomy done yesterday.  Pt's wife called Dr. Jeffie Pollock to tell him pt had developed fever, lethargy and confusion.  Per Dr. Jeffie Pollock pt will most likely need to be worked up for sepsis and admitted.  Will call Dr. Jeffie Pollock and inform him that pt has arrived in the ED.
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
## 2015-12-21 NOTE — Progress Notes (Signed)
80 U of Humulin R U-500 given to patient for a morning BS of 246 in right arm with insulin pen provided by patient. Will continue to monitor.

## 2015-12-22 DIAGNOSIS — Z9111 Patient's noncompliance with dietary regimen: Secondary | ICD-10-CM | POA: Diagnosis not present

## 2015-12-22 DIAGNOSIS — Z794 Long term (current) use of insulin: Secondary | ICD-10-CM | POA: Diagnosis not present

## 2015-12-22 DIAGNOSIS — G894 Chronic pain syndrome: Secondary | ICD-10-CM | POA: Diagnosis not present

## 2015-12-22 DIAGNOSIS — J189 Pneumonia, unspecified organism: Secondary | ICD-10-CM | POA: Diagnosis not present

## 2015-12-22 DIAGNOSIS — Z6835 Body mass index (BMI) 35.0-35.9, adult: Secondary | ICD-10-CM | POA: Diagnosis not present

## 2015-12-22 DIAGNOSIS — T814XXA Infection following a procedure, initial encounter: Secondary | ICD-10-CM | POA: Diagnosis not present

## 2015-12-22 DIAGNOSIS — Y95 Nosocomial condition: Secondary | ICD-10-CM | POA: Diagnosis present

## 2015-12-22 DIAGNOSIS — F329 Major depressive disorder, single episode, unspecified: Secondary | ICD-10-CM

## 2015-12-22 DIAGNOSIS — E210 Primary hyperparathyroidism: Secondary | ICD-10-CM | POA: Diagnosis present

## 2015-12-22 DIAGNOSIS — I2510 Atherosclerotic heart disease of native coronary artery without angina pectoris: Secondary | ICD-10-CM | POA: Diagnosis present

## 2015-12-22 DIAGNOSIS — G4730 Sleep apnea, unspecified: Secondary | ICD-10-CM | POA: Diagnosis present

## 2015-12-22 DIAGNOSIS — F419 Anxiety disorder, unspecified: Secondary | ICD-10-CM | POA: Diagnosis present

## 2015-12-22 DIAGNOSIS — K449 Diaphragmatic hernia without obstruction or gangrene: Secondary | ICD-10-CM | POA: Diagnosis present

## 2015-12-22 DIAGNOSIS — Z87891 Personal history of nicotine dependence: Secondary | ICD-10-CM | POA: Diagnosis not present

## 2015-12-22 DIAGNOSIS — N12 Tubulo-interstitial nephritis, not specified as acute or chronic: Secondary | ICD-10-CM | POA: Diagnosis present

## 2015-12-22 DIAGNOSIS — A419 Sepsis, unspecified organism: Secondary | ICD-10-CM | POA: Diagnosis not present

## 2015-12-22 DIAGNOSIS — N10 Acute tubulo-interstitial nephritis: Secondary | ICD-10-CM | POA: Diagnosis present

## 2015-12-22 DIAGNOSIS — K219 Gastro-esophageal reflux disease without esophagitis: Secondary | ICD-10-CM | POA: Diagnosis present

## 2015-12-22 DIAGNOSIS — E119 Type 2 diabetes mellitus without complications: Secondary | ICD-10-CM | POA: Diagnosis present

## 2015-12-22 DIAGNOSIS — J151 Pneumonia due to Pseudomonas: Secondary | ICD-10-CM | POA: Diagnosis not present

## 2015-12-22 DIAGNOSIS — N202 Calculus of kidney with calculus of ureter: Secondary | ICD-10-CM | POA: Diagnosis present

## 2015-12-22 DIAGNOSIS — E162 Hypoglycemia, unspecified: Secondary | ICD-10-CM | POA: Diagnosis present

## 2015-12-22 DIAGNOSIS — G9341 Metabolic encephalopathy: Secondary | ICD-10-CM | POA: Diagnosis not present

## 2015-12-22 DIAGNOSIS — Z936 Other artificial openings of urinary tract status: Secondary | ICD-10-CM | POA: Diagnosis not present

## 2015-12-22 DIAGNOSIS — Z466 Encounter for fitting and adjustment of urinary device: Secondary | ICD-10-CM | POA: Diagnosis not present

## 2015-12-22 DIAGNOSIS — Z7982 Long term (current) use of aspirin: Secondary | ICD-10-CM | POA: Diagnosis not present

## 2015-12-22 DIAGNOSIS — M5137 Other intervertebral disc degeneration, lumbosacral region: Secondary | ICD-10-CM | POA: Diagnosis present

## 2015-12-22 DIAGNOSIS — I10 Essential (primary) hypertension: Secondary | ICD-10-CM | POA: Diagnosis not present

## 2015-12-22 DIAGNOSIS — E7800 Pure hypercholesterolemia, unspecified: Secondary | ICD-10-CM

## 2015-12-22 DIAGNOSIS — Z8614 Personal history of Methicillin resistant Staphylococcus aureus infection: Secondary | ICD-10-CM | POA: Diagnosis not present

## 2015-12-22 DIAGNOSIS — Z87442 Personal history of urinary calculi: Secondary | ICD-10-CM | POA: Diagnosis not present

## 2015-12-22 DIAGNOSIS — E109 Type 1 diabetes mellitus without complications: Secondary | ICD-10-CM | POA: Diagnosis not present

## 2015-12-22 DIAGNOSIS — N200 Calculus of kidney: Secondary | ICD-10-CM | POA: Diagnosis not present

## 2015-12-22 DIAGNOSIS — Z89511 Acquired absence of right leg below knee: Secondary | ICD-10-CM | POA: Diagnosis not present

## 2015-12-22 LAB — STREP PNEUMONIAE URINARY ANTIGEN: Strep Pneumo Urinary Antigen: NEGATIVE

## 2015-12-22 LAB — INFLUENZA PANEL BY PCR (TYPE A & B)
H1N1FLUPCR: NOT DETECTED
INFLBPCR: NEGATIVE
Influenza A By PCR: NEGATIVE

## 2015-12-22 LAB — GLUCOSE, CAPILLARY
GLUCOSE-CAPILLARY: 267 mg/dL — AB (ref 65–99)
Glucose-Capillary: 213 mg/dL — ABNORMAL HIGH (ref 65–99)
Glucose-Capillary: 204 mg/dL — ABNORMAL HIGH (ref 65–99)
Glucose-Capillary: 226 mg/dL — ABNORMAL HIGH (ref 65–99)

## 2015-12-22 LAB — URINE MICROSCOPIC-ADD ON

## 2015-12-22 LAB — URINALYSIS, ROUTINE W REFLEX MICROSCOPIC
Bilirubin Urine: NEGATIVE
GLUCOSE, UA: 100 mg/dL — AB
Ketones, ur: 15 mg/dL — AB
Nitrite: NEGATIVE
PH: 5.5 (ref 5.0–8.0)
PROTEIN: 100 mg/dL — AB
SPECIFIC GRAVITY, URINE: 1.018 (ref 1.005–1.030)

## 2015-12-22 LAB — PROCALCITONIN: PROCALCITONIN: 0.8 ng/mL

## 2015-12-22 LAB — LACTIC ACID, PLASMA
LACTIC ACID, VENOUS: 1.7 mmol/L (ref 0.5–2.0)
Lactic Acid, Venous: 1.4 mmol/L (ref 0.5–2.0)

## 2015-12-22 LAB — APTT: APTT: 41 s — AB (ref 24–37)

## 2015-12-22 LAB — PROTIME-INR
INR: 1.2 (ref 0.00–1.49)
Prothrombin Time: 14.9 seconds (ref 11.6–15.2)

## 2015-12-22 LAB — MRSA PCR SCREENING: MRSA BY PCR: NEGATIVE

## 2015-12-22 LAB — BRAIN NATRIURETIC PEPTIDE: B Natriuretic Peptide: 35.5 pg/mL (ref 0.0–100.0)

## 2015-12-22 MED ORDER — VANCOMYCIN HCL IN DEXTROSE 1-5 GM/200ML-% IV SOLN: 1000.0000 mg | Freq: Two times a day (BID) | INTRAVENOUS | Status: DC

## 2015-12-22 MED ORDER — ASPIRIN EC 81 MG PO TBEC
81.0000 mg | DELAYED_RELEASE_TABLET | Freq: Every morning | ORAL | Status: DC
  Administered 2015-12-22 – 2015-12-23 (×2): 81 mg via ORAL
  Filled 2015-12-22 (×2): qty 1

## 2015-12-22 MED ORDER — SODIUM CHLORIDE 0.9 % IV BOLUS (SEPSIS): 2000.0000 mL | INTRAVENOUS | Status: DC

## 2015-12-22 MED ORDER — SIMVASTATIN 40 MG PO TABS
40.0000 mg | ORAL_TABLET | Freq: Every day | ORAL | Status: DC
  Administered 2015-12-22 – 2015-12-25 (×4): 40 mg via ORAL
  Filled 2015-12-22 (×4): qty 1

## 2015-12-22 MED ORDER — ACETAMINOPHEN 325 MG PO TABS: 650.0000 mg | ORAL_TABLET | Freq: Four times a day (QID) | ORAL | Status: DC | PRN

## 2015-12-22 MED ORDER — INSULIN REGULAR HUMAN (CONC) 500 UNIT/ML ~~LOC~~ SOPN
25.0000 [IU] | PEN_INJECTOR | Freq: Every day | SUBCUTANEOUS | Status: DC
  Administered 2015-12-23: 75 [IU] via SUBCUTANEOUS

## 2015-12-22 MED ORDER — PREGABALIN 50 MG PO CAPS
100.0000 mg | ORAL_CAPSULE | Freq: Three times a day (TID) | ORAL | Status: DC
  Administered 2015-12-22 – 2015-12-26 (×12): 100 mg via ORAL
  Filled 2015-12-22: qty 2
  Filled 2015-12-22: qty 1
  Filled 2015-12-22 (×2): qty 2
  Filled 2015-12-22 (×4): qty 1
  Filled 2015-12-22 (×4): qty 2

## 2015-12-22 MED ORDER — INSULIN REGULAR HUMAN (CONC) 500 UNIT/ML ~~LOC~~ SOPN
25.0000 [IU] | PEN_INJECTOR | Freq: Every day | SUBCUTANEOUS | Status: DC
  Administered 2015-12-23: 60 [IU] via SUBCUTANEOUS
  Administered 2015-12-25: 55 [IU] via SUBCUTANEOUS
  Administered 2015-12-26: 60 [IU] via SUBCUTANEOUS

## 2015-12-22 MED ORDER — HYDROMORPHONE HCL 1 MG/ML IJ SOLN: 1.0000 mg | INTRAMUSCULAR | Status: DC | PRN

## 2015-12-22 MED ORDER — DOCUSATE SODIUM 50 MG PO CAPS
250.0000 mg | ORAL_CAPSULE | Freq: Every day | ORAL | Status: DC
  Administered 2015-12-22 – 2015-12-26 (×4): 250 mg via ORAL
  Filled 2015-12-22 (×5): qty 1

## 2015-12-22 MED ORDER — OXYCODONE HCL 5 MG PO TABS
15.0000 mg | ORAL_TABLET | Freq: Four times a day (QID) | ORAL | Status: DC | PRN
  Administered 2015-12-22 – 2015-12-26 (×14): 15 mg via ORAL
  Filled 2015-12-22 (×14): qty 3

## 2015-12-22 MED ORDER — VANCOMYCIN HCL 10 G IV SOLR
2000.0000 mg | INTRAVENOUS | Status: DC
  Filled 2015-12-22: qty 2000

## 2015-12-22 MED ORDER — ZOLPIDEM TARTRATE 10 MG PO TABS: 10.0000 mg | ORAL_TABLET | Freq: Every evening | ORAL | Status: DC | PRN

## 2015-12-22 MED ORDER — INSULIN REGULAR HUMAN (CONC) 500 UNIT/ML ~~LOC~~ SOPN
25.0000 [IU] | PEN_INJECTOR | Freq: Every day | SUBCUTANEOUS | Status: DC
  Administered 2015-12-22 – 2015-12-24 (×2): 60 [IU] via SUBCUTANEOUS
  Administered 2015-12-26: 45 [IU] via SUBCUTANEOUS
  Filled 2015-12-22: qty 3

## 2015-12-22 MED ORDER — LORAZEPAM 0.5 MG PO TABS
0.5000 mg | ORAL_TABLET | Freq: Every evening | ORAL | Status: DC | PRN
Start: 2015-12-22 — End: 2015-12-26
  Administered 2015-12-23 (×2): 1 mg via ORAL
  Filled 2015-12-22: qty 2

## 2015-12-22 MED ORDER — HYDRALAZINE HCL 20 MG/ML IJ SOLN: 5.0000 mg | INTRAMUSCULAR | Status: DC | PRN

## 2015-12-22 MED ORDER — MICONAZOLE NITRATE 2 % EX POWD: CUTANEOUS | Status: DC | PRN

## 2015-12-22 MED ORDER — PANTOPRAZOLE SODIUM 40 MG PO TBEC
40.0000 mg | DELAYED_RELEASE_TABLET | Freq: Every day | ORAL | Status: DC
  Administered 2015-12-22 – 2015-12-26 (×4): 40 mg via ORAL
  Filled 2015-12-22 (×4): qty 1

## 2015-12-22 MED ORDER — INSULIN ASPART 100 UNIT/ML ~~LOC~~ SOLN: 0.0000 [IU] | Freq: Every day | SUBCUTANEOUS | Status: DC

## 2015-12-22 MED ORDER — OXYCODONE HCL 5 MG PO TABS
15.0000 mg | ORAL_TABLET | Freq: Four times a day (QID) | ORAL | Status: DC | PRN
  Administered 2015-12-22: 15 mg via ORAL
  Filled 2015-12-22: qty 3

## 2015-12-22 MED ORDER — TRAZODONE HCL 50 MG PO TABS
100.0000 mg | ORAL_TABLET | Freq: Every day | ORAL | Status: DC
  Administered 2015-12-22: 100 mg via ORAL
  Filled 2015-12-22: qty 2

## 2015-12-22 MED ORDER — FENTANYL CITRATE (PF) 100 MCG/2ML IJ SOLN
50.0000 ug | INTRAMUSCULAR | Status: DC | PRN
  Administered 2015-12-22 – 2015-12-26 (×12): 50 ug via INTRAVENOUS
  Filled 2015-12-22 (×13): qty 2

## 2015-12-22 MED ORDER — DEXTROSE 5 % IV SOLN
1.0000 g | Freq: Three times a day (TID) | INTRAVENOUS | Status: DC
  Administered 2015-12-22 – 2015-12-24 (×8): 1 g via INTRAVENOUS
  Filled 2015-12-22 (×9): qty 1

## 2015-12-22 MED ORDER — SODIUM CHLORIDE 0.9 % IV SOLN
INTRAVENOUS | Status: DC
  Administered 2015-12-22 (×2): via INTRAVENOUS
  Administered 2015-12-24: 50 mL/h via INTRAVENOUS

## 2015-12-22 MED ORDER — NYSTATIN 100000 UNIT/GM EX POWD
1.0000 g | Freq: Every day | CUTANEOUS | Status: DC | PRN
  Filled 2015-12-22: qty 15

## 2015-12-22 MED ORDER — ONDANSETRON HCL 4 MG/2ML IJ SOLN
4.0000 mg | Freq: Three times a day (TID) | INTRAMUSCULAR | Status: DC | PRN
  Administered 2015-12-22 – 2015-12-25 (×6): 4 mg via INTRAVENOUS
  Filled 2015-12-22 (×5): qty 2

## 2015-12-22 MED ORDER — INSULIN ASPART 100 UNIT/ML ~~LOC~~ SOLN
0.0000 [IU] | Freq: Three times a day (TID) | SUBCUTANEOUS | Status: DC
  Administered 2015-12-22 (×2): 3 [IU] via SUBCUTANEOUS
  Administered 2015-12-22: 5 [IU] via SUBCUTANEOUS
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
## 2015-12-22 NOTE — Progress Notes (Signed)
Pt received 2,000 mg dose of Vancomycin at 04:40 this morning. MAR charting that says dose was held is an error.

## 2015-12-22 NOTE — Progress Notes (Signed)
Pharmacy Antibiotic Note  [name] is a 65 y.o. male admitted on 12/21/2015 with UTI  Pharmacy had been consulted for cefepime dosing.  Upon admission pharmacy now asked to dose Vancomycin for pneumonia and to adjust other antibiotics based on renal function.  As pt now being treated for pneuonia, will adjust Cefepime dose to q8h regimen.  Plan: Vancomycin  Cefepime 1gm IV q8h Follow cultures, clinical course  Height: 6\' 1"  (185.4 cm) Weight: 274 lb 11.1 oz (124.6 kg) IBW/kg (Calculated) : 79.9  Temp (24hrs), Avg:100.6 F (38.1 C), Min:98.1 F (36.7 C), Max:104.4 F (40.2 C)   Recent Labs Lab 12/19/15 0745 12/20/15 1216 12/21/15 0446 12/21/15 2126 12/21/15 2131  WBC 10.2 11.4* 10.5  --  13.2*  CREATININE 0.90 0.98 0.94  --  1.00  LATICACIDVEN  --   --   --  1.44  --     Estimated Creatinine Clearance: 106 mL/min (by C-G formula based on Cr of 1).    Allergies  Allergen Reactions  . Symbicort [Budesonide-Formoterol Fumarate] Other (See Comments)    Hyperglycemia   . Advair Diskus [Fluticasone-Salmeterol] Other (See Comments)    hyperglycemia  . Hydromorphone Nausea And Vomiting    Itching   . Morphine Itching  . Soap Itching and Other (See Comments)    Other Reaction: ivory soap=itching     Antimicrobials this admission: 4/7  Cefepime >> 4/8 Vanc >>  Dose adjustments this admission:   Microbiology results: 4/7 blood x2: 4/7 urine:  4/8 sputum: 4/8 MRSA 4/8 Resp virus panL:  Thank you for allowing pharmacy to be a part of this patient's care.  Leone Haven, PharmD  12/22/2015, 4:12 AM

## 2015-12-22 NOTE — Progress Notes (Addendum)
PROGRESS NOTE    [name]  [name]  DOB: 26-Apr-1951  DOA: 12/21/2015 PCP: Wendie Agreste, MD Outpatient Specialists: Pain Management: Dr. Primus Bravo in [facility] course: 65 y.o. male with PMH of hypertension, hyperlipidemia, diabetes mellitus, GERD, depression, PVD, kidney stone, CAD, kidney stones, s/p left PCNL by Dr. Alyson Ingles on 4/6, discharged home on 4/7, presented to the ED same afternoon with complaints of fever, confusion, lethargy, hematuria, dysuria & left flank pain. In ED, patient was found to have positive urinalysis with moderate amount of leukocytes, WBC 13.2, temperature 104.4, tachycardia, tachypnea, oxygen desaturated to 83%, electrolytes renal function okay, lactate 1.44. Chest x-ray showed new patchy infiltration in RLL. Admitted to stepdown unit for sepsis secondary to acute pyelonephritis and possible HCAP. Urology consulted. Improving.  Assessment & Plan:   Postoperative Sepsis secondary to acute pyelonephritis and possible healthcare associated pneumonia (RLL) - Treated with IV fluids and started IV vancomycin and cefepime. - RSV panel and urine Legionella antigen: Pending. Flu panel PCR: Negative. Urinary pneumococcal antigen: Negative. Lactate normal. Pro-calcitonin 0.8. - Urine culture 1 and blood cultures 2: Pending - Chest x-ray was negative but pneumonia appeared on CT renal stone study. - MRSA PCR negative. DC vancomycin (upon chart review, was never started) - Improving. Continue current management.  Status post left PCNL 4/6/nephrolithiasis - Urology consultation and follow-up appreciated. - Patient has left nephrostomy tube with some pain at that site. Adjust pain medications. - Right PCNL planned for 4/10.  Essential hypertension - Reasonable control. Continue hydralazine when necessary.  Chronic pain - Follows with pain management as outpatient. - States significant pain at left flank/PCNL site rated at 10/10 in  severity. Titrate pain medications and monitor.  HLD:  - Last LDL was 50 on 12/06/15  - Continue home medications: Zocor  Depression and anxiety: - Continue home medications: ativan   DM-II/IDDM:  - Last A1c 7.3 on 12/06/15, fairly controled. Patient is taking sliding scale insulin at home - Patient is on high-dose insulin's at home i.e. U 500. Discussed at length with patient's spouse. She stated that patient was on home regimen of these insulins during recent hospitalization by urology and when he had a TIA and was admitted at [facility].  - Discussed extensively with pharmacy who will place insulins as per home orders and monitor closely.    CAD:  - No CP - continue ASA and Zocor  GERD/large hiatal hernia: - Protonix   Confusion - Likely secondary to sepsis. Resolved.  Morbid obesity/Body mass index is 36.25 kg/(m^2).       DVT prophylaxis: SCDs Code Status: Full Family Communication: None at bedside Disposition Plan: Admitted to stepdown. Continue management in stepdown for additional 24 hours. DC home when medically stable.   Consultants:  Urology  Procedures:  Status post left PCNL 4/6  Antimicrobials:  Cefepime 4/7 >   Subjective: Feels better. Left flank pain rated at 10/10 in severity and not controlled by current oral pain medications.As per RN, no acute issues.   Objective: Filed Vitals:   12/22/15 0700 12/22/15 0800 12/22/15 0900 12/22/15 1000  BP: 157/56   146/60  Pulse: 101 99 100 100  Temp:  97.7 F (36.5 C)    TempSrc:  Oral    Resp: 20 16 25 19   Height:      Weight:      SpO2: 95% 94% 91% 95%    Intake/Output Summary (Last 24 hours) at 12/22/15 1149 Last data filed at 12/22/15 1131  Gross per 24 hour  Intake   1350 ml  Output   1350 ml  Net      0 ml   Filed Weights   12/22/15 0312  Weight: 124.6 kg (274 lb 11.1 oz)    Exam:  General exam: Moderately built and morbidly obese male lying comfortably propped up in bed.    Respiratory system: diminished breath sounds in the bases but otherwise clear to auscultation. No increased work of breathing. Cardiovascular system: S1 & S2 heard, RRR. No JVD, murmurs, gallops, clicks or pedal edema. Telemetry: Sinus tachycardia in the 100s.  Gastrointestinal system: Abdomen is nondistended, soft and nontender. Normal bowel sounds heard. Left PCN draining concentrated urine.  Central nervous system: Alert and oriented. No focal neurological deficits. Extremities: Symmetric 5 x 5 power.   Data Reviewed: Basic Metabolic Panel:  Recent Labs Lab 12/19/15 0745 12/20/15 1216 12/21/15 0446 12/21/15 2131  NA 137 136 140 136  K 4.2 4.0 4.6 3.9  CL 104 102 104 100*  CO2 24 25 29 24   GLUCOSE 349* 266* 224* 168*  BUN 10 11 10 12   CREATININE 0.90 0.98 0.94 1.00  CALCIUM 9.7 9.1 9.5 9.8   Liver Function Tests:  Recent Labs Lab 12/21/15 2131  AST 19  ALT 16*  ALKPHOS 133*  BILITOT 1.5*  PROT 7.2  ALBUMIN 3.8   No results for input(s): LIPASE, AMYLASE in the last 168 hours. No results for input(s): AMMONIA in the last 168 hours. CBC:  Recent Labs Lab 12/19/15 0745 12/20/15 1216 12/21/15 0446 12/21/15 2131  WBC 10.2 11.4* 10.5 13.2*  NEUTROABS 6.7  --   --  11.0*  HGB 12.3* 11.7* 10.8* 12.6*  HCT 37.1* 34.9* 32.6* 37.8*  MCV 102.5* 102.6* 103.2* 102.2*  PLT 385 371 341 392   Cardiac Enzymes: No results for input(s): CKTOTAL, CKMB, CKMBINDEX, TROPONINI in the last 168 hours. BNP (last 3 results) No results for input(s): PROBNP in the last 8760 hours. CBG:  Recent Labs Lab 12/21/15 0328 12/21/15 0715 12/21/15 1136 12/21/15 2045 12/22/15 0822  GLUCAP 194* 246* 170* 137* 213*    Recent Results (from the past 240 hour(s))  MRSA PCR Screening     Status: None   Collection Time: 12/22/15  4:22 AM  Result Value Ref Range Status   MRSA by PCR NEGATIVE NEGATIVE Final    Comment:        The GeneXpert MRSA Assay (FDA approved for NASAL  specimens only), is one component of a comprehensive MRSA colonization surveillance program. It is not intended to diagnose MRSA infection nor to guide or monitor treatment for MRSA infections.          Studies: Dg Abd 1 View  12/20/2015  CLINICAL DATA:  Left renal stone removal and stent placement EXAM: ABDOMEN - 1 VIEW COMPARISON:  12/19/2015.  11/05/2015. FINDINGS: C-arm images show a left nephrostomy tube in place. Wire in catheter are in place extending from this approach into the bladder. Contrast injection shows opacification of the renal collecting system. Limited resolution. IMPRESSION: Intraprocedural films for left stone removal and stent placement. Electronically Signed   By: Nelson Chimes M.D.   On: 12/20/2015 12:37   Dg Chest Port 1 View  12/21/2015  CLINICAL DATA:  Sepsis.  Fever and tachycardia. EXAM: PORTABLE CHEST 1 VIEW COMPARISON:  12/07/2015 FINDINGS: Chronic right chest wall deformities and pleural thickening due to remote rib fractures. Mild chronic interstitial coarsening. No confluent alveolar opacity. No large effusions. Unchanged  cardiomegaly and aortic tortuosity. IMPRESSION: No acute cardiopulmonary findings. Electronically Signed   By: Andreas Newport M.D.   On: 12/21/2015 21:35   Dg C-arm 1-60 Min-no Report  12/20/2015  CLINICAL DATA: left perc C-ARM 1-60 MINUTES Fluoroscopy was utilized by the requesting physician.  No radiographic interpretation.   Ct Renal Stone Study  12/22/2015  CLINICAL DATA:  Fever and confusion one day after percutaneous nephrolithotomy. EXAM: CT ABDOMEN AND PELVIS WITHOUT CONTRAST TECHNIQUE: Multidetector CT imaging of the abdomen and pelvis was performed following the standard protocol without IV contrast. COMPARISON:  11/05/2015 FINDINGS: There is a percutaneous nephrostomy on the left, extending into the left renal pelvis. There is a left ureteral stent with proximal end in the left renal pelvis and distal end in the urinary bladder.  No calculi are evident within the ureter. There is near complete removal of calculi from the left renal collecting system. There are 2 tiny residual calculus fragments in a left lower pole calyx, measuring up to 3 mm. There is a 3 x 4 mm calculus in a posterior left midpole calyx. There is mild stranding around the left renal pelvis and left ureter but no urinoma or other abnormal fluid collection. No perinephric hematoma. The right collecting system contains numerous calculi measuring up to 2 cm. This is unchanged from 11/05/2015. There are unremarkable unenhanced appearances of the liver, spleen, pancreas, biliary system and adrenals. The abdominal aorta is normal in caliber. There is mild atherosclerotic calcification. There is no adenopathy in the abdomen or pelvis. There is a large hiatal hernia.  Bowel is otherwise unremarkable. There is new patchy airspace opacity in the right lower lobe. IMPRESSION: 1. Left percutaneous nephrostomy catheter and left ureteral stent appear unchanged in position from the intra procedural images of 12/20/2015. Near complete removal of calculi from the left renal collecting system. No hematoma, urinoma or other immediate complication is evident. No ureteral calculus. 2. Unchanged right nephrolithiasis. 3. New patchy airspace opacity in the right lower lobe posteriorly may represent pneumonia or aspiration. 4. Large hiatal hernia. Electronically Signed   By: Andreas Newport M.D.   On: 12/22/2015 01:14        Scheduled Meds: . acetaminophen  650 mg Rectal Once  . aspirin EC  81 mg Oral q morning - 10a  . ceFEPime (MAXIPIME) IV  1 g Intravenous 3 times per day  . docusate sodium  250 mg Oral Daily  . insulin aspart  0-5 Units Subcutaneous QHS  . insulin aspart  0-9 Units Subcutaneous TID WC  . pantoprazole  40 mg Oral Daily  . pregabalin  100 mg Oral TID  . simvastatin  40 mg Oral QHS  . traZODone  100 mg Oral QHS  . vancomycin  2,000 mg Intravenous STAT  .  vancomycin  1,000 mg Intravenous Q12H   Continuous Infusions: . 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 [facility] Pager [phone]  If 7PM-7AM, please contact night-coverage [email or web address] Password [email or web address]  12/22/2015, 11:49 AM    LOS: 0 days

## 2015-12-22 NOTE — Progress Notes (Signed)
Patient ID: [name], male   DOB: August 14, 1951, 65 y.o.   MRN: [id number]    Subjective: [name] is awake and alert this morning with no fever.  He continues to have some left flank pain from the tubes.   CT stone study showed stable right renal stones with mild obstruction and left nephrostomy tubes in good position.    ROS:  Review of Systems  Respiratory: Negative for shortness of breath.   Cardiovascular: Negative for chest pain.  Gastrointestinal: Positive for abdominal pain. Negative for nausea.    Anti-infectives: Anti-infectives    Start     Dose/Rate Route Frequency Ordered Stop   12/22/15 1700  vancomycin (VANCOCIN) IVPB 1000 mg/200 mL premix     1,000 mg 200 mL/hr over 60 Minutes Intravenous Every 12 hours 12/22/15 0415     12/22/15 0600  ceFEPIme (MAXIPIME) 1 g in dextrose 5 % 50 mL IVPB     1 g 100 mL/hr over 30 Minutes Intravenous 3 times per day 12/22/15 0326 12/30/15 0559   12/22/15 0430  vancomycin (VANCOCIN) 2,000 mg in sodium chloride 0.9 % 500 mL IVPB     2,000 mg 250 mL/hr over 120 Minutes Intravenous STAT 12/22/15 0415 12/23/15 0430   12/21/15 2200  ceFEPIme (MAXIPIME) 1 g in dextrose 5 % 50 mL IVPB  Status:  Discontinued     1 g 100 mL/hr over 30 Minutes Intravenous Every 12 hours 12/21/15 2112 12/22/15 0410      Current Facility-Administered Medications  Medication Dose Route Frequency Provider Last Rate Last Dose  . 0.9 %  sodium chloride infusion   Intravenous Continuous Ivor Costa, MD 50 mL/hr at 12/22/15 0413    . acetaminophen (TYLENOL) suppository 650 mg  650 mg Rectal Once Shary Decamp, PA-C   650 mg at 12/21/15 2238  . aspirin EC tablet 81 mg  81 mg Oral q morning - 10a Ivor Costa, MD      . ceFEPIme (MAXIPIME) 1 g in dextrose 5 % 50 mL IVPB  1 g Intravenous 3 times per day Ivor Costa, MD   1 g at 12/22/15 0757  . docusate sodium (COLACE) capsule 250 mg  250 mg Oral Daily Ivor Costa, MD      . fentaNYL (SUBLIMAZE) injection 50 mcg  50 mcg  Intravenous Q3H PRN Modena Jansky, MD   50 mcg at 12/22/15 0831  . hydrALAZINE (APRESOLINE) injection 5 mg  5 mg Intravenous Q2H PRN Ivor Costa, MD      . insulin aspart (novoLOG) injection 0-5 Units  0-5 Units Subcutaneous QHS Ivor Costa, MD      . insulin aspart (novoLOG) injection 0-9 Units  0-9 Units Subcutaneous TID WC Ivor Costa, MD   3 Units at 12/22/15 0831  . LORazepam (ATIVAN) tablet 0.5-1 mg  0.5-1 mg Oral QHS PRN Ivor Costa, MD      . nystatin (MYCOSTATIN) powder 1 g  1 g Topical Daily PRN Ivor Costa, MD      . ondansetron [facility]) injection 4 mg  4 mg Intravenous Q8H PRN Ivor Costa, MD   4 mg at 12/22/15 0551  . oxyCODONE (Oxy IR/ROXICODONE) immediate release tablet 15 mg  15 mg Oral Q6H PRN Modena Jansky, MD      . pantoprazole (PROTONIX) EC tablet 40 mg  40 mg Oral Daily Ivor Costa, MD      . pregabalin (LYRICA) capsule 100 mg  100 mg Oral TID Ivor Costa, MD      .  simvastatin (ZOCOR) tablet 40 mg  40 mg Oral QHS Ivor Costa, MD      . traZODone (DESYREL) tablet 100 mg  100 mg Oral QHS Ivor Costa, MD      . vancomycin (VANCOCIN) 2,000 mg in sodium chloride 0.9 % 500 mL IVPB  2,000 mg Intravenous STAT Nilda Simmer, RPH   Stopped at 12/22/15 0440  . vancomycin (VANCOCIN) IVPB 1000 mg/200 mL premix  1,000 mg Intravenous Q12H Leann T Poindexter, RPH      . zolpidem (AMBIEN) tablet 10 mg  10 mg Oral QHS PRN Ivor Costa, MD       Facility-Administered Medications Ordered in Other Encounters  Medication Dose Route Frequency Provider Last Rate Last Dose  . 0.9 %  sodium chloride infusion   Intravenous Continuous Darrell K Allred, PA-C 50 mL/hr at 12/19/15 0745       Objective: Vital signs in last 24 hours: Temp:  [97.7 F (36.5 C)-104.4 F (40.2 C)] 97.7 F (36.5 C) (04/08 0800) Pulse Rate:  [99-140] 100 (04/08 0900) Resp:  [11-32] 25 (04/08 0900) BP: (121-160)/(54-107) 157/56 mmHg (04/08 0700) SpO2:  [83 %-99 %] 91 % (04/08 0900) Weight:  [124.6 kg (274 lb 11.1 oz)] 124.6 kg  (274 lb 11.1 oz) (04/08 0312)  Intake/Output from previous day: 04/07 0701 - 04/08 0700 In: 1350 [I.V.:300; IV Piggyback:1050] Out: 300 [Urine:300] Intake/Output this shift: Total I/O In: -  Out: 925 [Urine:925]   Physical Exam  Lab Results:   Recent Labs  12/21/15 0446 12/21/15 2131  WBC 10.5 13.2*  HGB 10.8* 12.6*  HCT 32.6* 37.8*  PLT 341 392   BMET  Recent Labs  12/21/15 0446 12/21/15 2131  NA 140 136  K 4.6 3.9  CL 104 100*  CO2 29 24  GLUCOSE 224* 168*  BUN 10 12  CREATININE 0.94 1.00  CALCIUM 9.5 9.8   PT/INR  Recent Labs  12/22/15 0330  LABPROT 14.9  INR 1.20   ABG No results for input(s): PHART, HCO3 in the last 72 hours.  Invalid input(s): PCO2, PO2  Studies/Results: Dg Abd 1 View  12/20/2015  CLINICAL DATA:  Left renal stone removal and stent placement EXAM: ABDOMEN - 1 VIEW COMPARISON:  12/19/2015.  11/05/2015. FINDINGS: C-arm images show a left nephrostomy tube in place. Wire in catheter are in place extending from this approach into the bladder. Contrast injection shows opacification of the renal collecting system. Limited resolution. IMPRESSION: Intraprocedural films for left stone removal and stent placement. Electronically Signed   By: Nelson Chimes M.D.   On: 12/20/2015 12:37   Dg Chest Port 1 View  12/21/2015  CLINICAL DATA:  Sepsis.  Fever and tachycardia. EXAM: PORTABLE CHEST 1 VIEW COMPARISON:  12/07/2015 FINDINGS: Chronic right chest wall deformities and pleural thickening due to remote rib fractures. Mild chronic interstitial coarsening. No confluent alveolar opacity. No large effusions. Unchanged cardiomegaly and aortic tortuosity. IMPRESSION: No acute cardiopulmonary findings. Electronically Signed   By: Andreas Newport M.D.   On: 12/21/2015 21:35   Dg C-arm 1-60 Min-no Report  12/20/2015  CLINICAL DATA: left perc C-ARM 1-60 MINUTES Fluoroscopy was utilized by the requesting physician.  No radiographic interpretation.   Ct Renal  Stone Study  12/22/2015  CLINICAL DATA:  Fever and confusion one day after percutaneous nephrolithotomy. EXAM: CT ABDOMEN AND PELVIS WITHOUT CONTRAST TECHNIQUE: Multidetector CT imaging of the abdomen and pelvis was performed following the standard protocol without IV contrast. COMPARISON:  11/05/2015 FINDINGS: There is a  percutaneous nephrostomy on the left, extending into the left renal pelvis. There is a left ureteral stent with proximal end in the left renal pelvis and distal end in the urinary bladder. No calculi are evident within the ureter. There is near complete removal of calculi from the left renal collecting system. There are 2 tiny residual calculus fragments in a left lower pole calyx, measuring up to 3 mm. There is a 3 x 4 mm calculus in a posterior left midpole calyx. There is mild stranding around the left renal pelvis and left ureter but no urinoma or other abnormal fluid collection. No perinephric hematoma. The right collecting system contains numerous calculi measuring up to 2 cm. This is unchanged from 11/05/2015. There are unremarkable unenhanced appearances of the liver, spleen, pancreas, biliary system and adrenals. The abdominal aorta is normal in caliber. There is mild atherosclerotic calcification. There is no adenopathy in the abdomen or pelvis. There is a large hiatal hernia.  Bowel is otherwise unremarkable. There is new patchy airspace opacity in the right lower lobe. IMPRESSION: 1. Left percutaneous nephrostomy catheter and left ureteral stent appear unchanged in position from the intra procedural images of 12/20/2015. Near complete removal of calculi from the left renal collecting system. No hematoma, urinoma or other immediate complication is evident. No ureteral calculus. 2. Unchanged right nephrolithiasis. 3. New patchy airspace opacity in the right lower lobe posteriorly may represent pneumonia or aspiration. 4. Large hiatal hernia. Electronically Signed   By: Andreas Newport  M.D.   On: 12/22/2015 01:14     Assessment and Plan: Recent left PCNL with post op sepsis.   He appears markedly improved today but complains of pain from the left nephrostomy tube.   Right renal stones with planned right PCNL on 4/10.  CT stone study showed no change in the right renal stones without increased hydro and the left tubes were in good position without urinoma or hematuria.    I have ordered a mod carb diet.     I will discuss the disposition of the left tubes with Dr. Alyson Ingles to see if that can be removed.   With the rapid improvement, he may still be able to have the right PCNL on 4/10.         LOS: 0 days    Malka So 12/22/2015 [id number]

## 2015-12-23 ENCOUNTER — Other Ambulatory Visit: Payer: Self-pay | Admitting: Family Medicine

## 2015-12-23 DIAGNOSIS — G894 Chronic pain syndrome: Secondary | ICD-10-CM

## 2015-12-23 LAB — EXPECTORATED SPUTUM ASSESSMENT W REFEX TO RESP CULTURE

## 2015-12-23 LAB — BASIC METABOLIC PANEL
Anion gap: 8 (ref 5–15)
BUN: 7 mg/dL (ref 6–20)
CHLORIDE: 105 mmol/L (ref 101–111)
CO2: 23 mmol/L (ref 22–32)
CREATININE: 0.72 mg/dL (ref 0.61–1.24)
Calcium: 9.3 mg/dL (ref 8.9–10.3)
GFR calc Af Amer: >60 mL/min (ref >60)
GFR calc non Af Amer: >60 mL/min (ref >60)
GLUCOSE: 163 mg/dL — AB (ref 65–99)
Potassium: 3.8 mmol/L (ref 3.5–5.1)
Sodium: 136 mmol/L (ref 135–145)

## 2015-12-23 LAB — CBC
HEMATOCRIT: 29.3 % — AB (ref 39.0–52.0)
Hemoglobin: 10.2 g/dL — ABNORMAL LOW (ref 13.0–17.0)
MCH: 33.9 pg (ref 26.0–34.0)
MCHC: 34.8 g/dL (ref 30.0–36.0)
MCV: 97.3 fL (ref 78.0–100.0)
PLATELETS: 324 10*3/uL (ref 150–400)
RBC: 3.01 MIL/uL — ABNORMAL LOW (ref 4.22–5.81)
RDW: 15 % (ref 11.5–15.5)
WBC: 7.3 10*3/uL (ref 4.0–10.5)

## 2015-12-23 LAB — GLUCOSE, CAPILLARY
GLUCOSE-CAPILLARY: 57 mg/dL — AB (ref 65–99)
Glucose-Capillary: 226 mg/dL — ABNORMAL HIGH (ref 65–99)
Glucose-Capillary: 170 mg/dL — ABNORMAL HIGH (ref 65–99)
Glucose-Capillary: 202 mg/dL — ABNORMAL HIGH (ref 65–99)
Glucose-Capillary: 60 mg/dL — ABNORMAL LOW (ref 65–99)
Glucose-Capillary: 66 mg/dL (ref 65–99)
Glucose-Capillary: 113 mg/dL — ABNORMAL HIGH (ref 65–99)

## 2015-12-23 LAB — URINE CULTURE: CULTURE: NO GROWTH

## 2015-12-23 LAB — EXPECTORATED SPUTUM ASSESSMENT W GRAM STAIN, RFLX TO RESP C

## 2015-12-23 MED ORDER — HYDRALAZINE HCL 20 MG/ML IJ SOLN: 5.0000 mg | Freq: Four times a day (QID) | INTRAMUSCULAR | Status: DC | PRN

## 2015-12-23 MED ORDER — INSULIN REGULAR HUMAN (CONC) 500 UNIT/ML ~~LOC~~ SOPN
35.0000 [IU] | PEN_INJECTOR | Freq: Every day | SUBCUTANEOUS | Status: DC
  Administered 2015-12-25: 75 [IU] via SUBCUTANEOUS
  Administered 2015-12-26: 80 [IU] via SUBCUTANEOUS
  Filled 2015-12-23: qty 3

## 2015-12-23 NOTE — Progress Notes (Signed)
Patient ID: [name], male   DOB: 03-28-1951, 65 y.o.   MRN: [id number]    Subjective: [name] is doing better today with no further fever or mental status changes.  He continues to have pain from the left perc tube.  ROS:  Review of Systems  Constitutional: Negative for fever.  Gastrointestinal: Negative for nausea.    Anti-infectives: Anti-infectives    Start     Dose/Rate Route Frequency Ordered Stop   12/22/15 1700  vancomycin (VANCOCIN) IVPB 1000 mg/200 mL premix  Status:  Discontinued     1,000 mg 200 mL/hr over 60 Minutes Intravenous Every 12 hours 12/22/15 0415 12/22/15 1208   12/22/15 0600  ceFEPIme (MAXIPIME) 1 g in dextrose 5 % 50 mL IVPB     1 g 100 mL/hr over 30 Minutes Intravenous 3 times per day 12/22/15 0326 12/30/15 0559   12/22/15 0430  vancomycin (VANCOCIN) 2,000 mg in sodium chloride 0.9 % 500 mL IVPB  Status:  Discontinued     2,000 mg 250 mL/hr over 120 Minutes Intravenous STAT 12/22/15 0415 12/22/15 1208   12/21/15 2200  ceFEPIme (MAXIPIME) 1 g in dextrose 5 % 50 mL IVPB  Status:  Discontinued     1 g 100 mL/hr over 30 Minutes Intravenous Every 12 hours 12/21/15 2112 12/22/15 0410      Current Facility-Administered Medications  Medication Dose Route Frequency Provider Last Rate Last Dose  . 0.9 %  sodium chloride infusion   Intravenous Continuous Modena Jansky, MD 50 mL/hr at 12/22/15 1746    . acetaminophen (TYLENOL) tablet 650 mg  650 mg Oral Q6H PRN Modena Jansky, MD      . ceFEPIme (MAXIPIME) 1 g in dextrose 5 % 50 mL IVPB  1 g Intravenous 3 times per day Ivor Costa, MD   1 g at 12/23/15 0558  . docusate sodium (COLACE) capsule 250 mg  250 mg Oral Daily Ivor Costa, MD   250 mg at 12/23/15 1011  . fentaNYL (SUBLIMAZE) injection 50 mcg  50 mcg Intravenous Q3H PRN Modena Jansky, MD   50 mcg at 12/23/15 0849  . hydrALAZINE (APRESOLINE) injection 5 mg  5 mg Intravenous Q2H PRN Ivor Costa, MD      . insulin regular human CONCENTRATED (HUMULIN R)  500 UNIT/ML kwikpen 25-105 Units  25-105 Units Subcutaneous QAC breakfast Modena Jansky, MD   75 Units at 12/23/15 0850  . insulin regular human CONCENTRATED (HUMULIN R) 500 UNIT/ML kwikpen 25-85 Units  25-85 Units Subcutaneous QAC lunch Modena Jansky, MD      . insulin regular human CONCENTRATED (HUMULIN R) 500 UNIT/ML kwikpen 25-85 Units  25-85 Units Subcutaneous QAC supper Modena Jansky, MD   60 Units at 12/22/15 1836  . LORazepam (ATIVAN) tablet 0.5-1 mg  0.5-1 mg Oral QHS PRN Ivor Costa, MD      . nystatin (MYCOSTATIN) powder 1 g  1 g Topical Daily PRN Ivor Costa, MD      . ondansetron [facility]) injection 4 mg  4 mg Intravenous Q8H PRN Ivor Costa, MD   4 mg at 12/23/15 1011  . oxyCODONE (Oxy IR/ROXICODONE) immediate release tablet 15 mg  15 mg Oral Q6H PRN Modena Jansky, MD   15 mg at 12/23/15 1040  . pantoprazole (PROTONIX) EC tablet 40 mg  40 mg Oral Daily Ivor Costa, MD   40 mg at 12/23/15 1012  . pregabalin (LYRICA) capsule 100 mg  100 mg Oral TID  Ivor Costa, MD   100 mg at 12/23/15 1012  . simvastatin (ZOCOR) tablet 40 mg  40 mg Oral QHS Ivor Costa, MD   40 mg at 12/22/15 2049  . traZODone (DESYREL) tablet 100 mg  100 mg Oral QHS Ivor Costa, MD   100 mg at 12/22/15 2049  . zolpidem (AMBIEN) tablet 10 mg  10 mg Oral QHS PRN Ivor Costa, MD       Facility-Administered Medications Ordered in Other Encounters  Medication Dose Route Frequency Provider Last Rate Last Dose  . 0.9 %  sodium chloride infusion   Intravenous Continuous Darrell K Allred, PA-C 50 mL/hr at 12/19/15 0745       Objective: Vital signs in last 24 hours: Temp:  [98.8 F (37.1 C)-100.3 F (37.9 C)] 98.8 F (37.1 C) (04/09 0800) Pulse Rate:  [75-106] 84 (04/09 0900) Resp:  [12-20] 13 (04/09 0900) BP: (134-164)/(47-64) 151/57 mmHg (04/09 0800) SpO2:  [92 %-98 %] 98 % (04/09 0900)  Intake/Output from previous day: 04/08 0701 - 04/09 0700 In: 1755 [P.O.:480; I.V.:1175; IV Piggyback:100] Out: 3125  [Urine:3125] Intake/Output this shift: Total I/O In: 100 [I.V.:100] Out: 450 [Urine:450]   Physical Exam  Constitutional: He is well-developed, well-nourished, and in no distress.  Vitals reviewed.   Lab Results:   Recent Labs  12/21/15 2131 12/23/15 0343  WBC 13.2* 7.3  HGB 12.6* 10.2*  HCT 37.8* 29.3*  PLT 392 324   BMET  Recent Labs  12/21/15 2131 12/23/15 0343  NA 136 136  K 3.9 3.8  CL 100* 105  CO2 24 23  GLUCOSE 168* 163*  BUN 12 7  CREATININE 1.00 0.72  CALCIUM 9.8 9.3   PT/INR  Recent Labs  12/22/15 0330  LABPROT 14.9  INR 1.20   ABG No results for input(s): PHART, HCO3 in the last 72 hours.  Invalid input(s): PCO2, PO2  Studies/Results: Dg Chest Port 1 View  12/21/2015  CLINICAL DATA:  Sepsis.  Fever and tachycardia. EXAM: PORTABLE CHEST 1 VIEW COMPARISON:  12/07/2015 FINDINGS: Chronic right chest wall deformities and pleural thickening due to remote rib fractures. Mild chronic interstitial coarsening. No confluent alveolar opacity. No large effusions. Unchanged cardiomegaly and aortic tortuosity. IMPRESSION: No acute cardiopulmonary findings. Electronically Signed   By: Andreas Newport M.D.   On: 12/21/2015 21:35   Ct Renal Stone Study  12/22/2015  CLINICAL DATA:  Fever and confusion one day after percutaneous nephrolithotomy. EXAM: CT ABDOMEN AND PELVIS WITHOUT CONTRAST TECHNIQUE: Multidetector CT imaging of the abdomen and pelvis was performed following the standard protocol without IV contrast. COMPARISON:  11/05/2015 FINDINGS: There is a percutaneous nephrostomy on the left, extending into the left renal pelvis. There is a left ureteral stent with proximal end in the left renal pelvis and distal end in the urinary bladder. No calculi are evident within the ureter. There is near complete removal of calculi from the left renal collecting system. There are 2 tiny residual calculus fragments in a left lower pole calyx, measuring up to 3 mm. There is  a 3 x 4 mm calculus in a posterior left midpole calyx. There is mild stranding around the left renal pelvis and left ureter but no urinoma or other abnormal fluid collection. No perinephric hematoma. The right collecting system contains numerous calculi measuring up to 2 cm. This is unchanged from 11/05/2015. There are unremarkable unenhanced appearances of the liver, spleen, pancreas, biliary system and adrenals. The abdominal aorta is normal in caliber. There is mild atherosclerotic  calcification. There is no adenopathy in the abdomen or pelvis. There is a large hiatal hernia.  Bowel is otherwise unremarkable. There is new patchy airspace opacity in the right lower lobe. IMPRESSION: 1. Left percutaneous nephrostomy catheter and left ureteral stent appear unchanged in position from the intra procedural images of 12/20/2015. Near complete removal of calculi from the left renal collecting system. No hematoma, urinoma or other immediate complication is evident. No ureteral calculus. 2. Unchanged right nephrolithiasis. 3. New patchy airspace opacity in the right lower lobe posteriorly may represent pneumonia or aspiration. 4. Large hiatal hernia. Electronically Signed   By: Andreas Newport M.D.   On: 12/22/2015 01:14   The left nephrostomy tube was removed without difficulty and a new dressing was applied.   Assessment and Plan: Left nephrolithiasis s/p Left PCNL with postop sepsis.   He is doing well with a good recovery from the septic episode.   I removed the left nephrostomy tube today but he still has a stent.   Right nephrolithiasis.   He is scheduled for the right PCNL tomorrow and still should be able to proceed.  I will make him NPO and have requested the consent.   I have notified the OR of his inpatient status and replaced the order for IR.        LOS: 1 day    Gresham Caetano J 12/23/2015 [id number]
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
## 2015-12-23 NOTE — Discharge Summary (Signed)
Physician Discharge Summary  Patient ID: [name] MRN: [id number] DOB/AGE: 63-Oct-1952 63 y.o.  Admit date: 12/20/2015 Discharge date: 12/21/2015 Admission Diagnoses: Renal calculi Discharge Diagnoses:  Active Problems:   Renal calculi   Discharged Condition: good  Hospital Course: The patient tolerated the procedure well and was transferred to the floor on IV pain meds, IV fluid. On POD#1 foley was removed, pt was started on regular diet and they ambulated in the halls. Prior to discharge the pt was tolerating a regular diet, pain was controlled on PO pain meds, they were ambulating without difficulty, and they had normal bowel function.   Consults: None  Significant Diagnostic Studies: none  Treatments: surgery: L PCNL  Discharge Exam: Blood pressure 133/67, pulse 104, temperature 98.1 F (36.7 C), temperature source Oral, resp. rate 18, height 6\' 1"  (1.854 m), weight 124.739 kg (275 lb), SpO2 98 %. General appearance: alert, cooperative and appears stated age Head: Normocephalic, without obvious abnormality, atraumatic Eyes: conjunctivae/corneas clear. PERRL, EOM's intact. Fundi benign. Back: symmetric, no curvature. ROM normal. No CVA tenderness. Resp: clear to auscultation bilaterally Cardio: regular rate and rhythm, S1, S2 normal, no murmur, click, rub or gallop GI: soft, non-tender; bowel sounds normal; no masses,  no organomegaly Extremities: extremities normal, atraumatic, no cyanosis or edema  Disposition: 01-Home or Self Care     Medication List    STOP taking these medications        traZODone 100 MG tablet  Commonly known as:  DESYREL      TAKE these medications        aspirin EC 81 MG tablet  Take 81 mg by mouth every morning. Reported on 12/05/2015     ciprofloxacin 0.3 % ophthalmic solution  Commonly known as:  CILOXAN  Place 2 drops into both eyes every 4 (four) hours while awake. Administer 1 drop, every 2 hours, while awake, for 2 days. Then 1  drop, every 4 hours, while awake, for the next 5 days.     DEXILANT 30 MG capsule  Generic drug:  Dexlansoprazole  TAKE 1 CAPSULE(30 MG) BY MOUTH DAILY     docusate sodium 250 MG capsule  Commonly known as:  COLACE  Take 250 mg by mouth daily.     furosemide 40 MG tablet  Commonly known as:  LASIX  TAKE 1 TABLET BY MOUTH EVERY DAY     HUMULIN R U-500 KWIKPEN 500 UNIT/ML kwikpen  Generic drug:  insulin regular human CONCENTRATED  Inject 25-105 Units into the skin 3 (three) times daily. Sliding scale instructions per [facility]: "65+5 units at breakfast, 45+5 units at lunch and 45+5 units at supper." This means: CBG <70 = 0 units CBG 70-90 = 35 units if breakfast, 25 units if lunch or dinner. CBG 91-130 = base amount(65 units if breakfast, 45 units if lunch or dinner) CBG 131-150 = base amount + 5 units CBG 151-200 = + 5 more units CBG 201-250 = + 5 more units CBG 251-300 = + 5 more units CBG 301-350 = + 5 more units CBG 351-400 = + 5 more units CBG 401-450 = + 5 more units CBG >450 = + 5 more units     LORazepam 0.5 MG tablet  Commonly known as:  ATIVAN  Take 1 tablet at bedtime prn anxiety.     nystatin powder  Commonly known as:  MYCOSTATIN  Apply topically 3 (three) times daily.     ondansetron 4 MG tablet  Commonly known as:  ZOFRAN  Take 1-2 tablets (4-8 mg total) by mouth every 8 (eight) hours as needed.     oxyCODONE 15 MG immediate release tablet  Commonly known as:  ROXICODONE  Limit 1 tablet by mouth 3-5 times per day if tolerated     oxyCODONE 5 MG immediate release tablet  Commonly known as:  ROXICODONE  Limit 1 tab by mouth 3-5 times per day if tolerated  NOTE OXYCODONE IS 5 MG TAB     oxycodone 30 MG immediate release tablet  Commonly known as:  ROXICODONE  Take 1 tablet (30 mg total) by mouth every 4 (four) hours as needed for severe pain.     pregabalin 100 MG capsule  Commonly known as:  LYRICA  Take 100 mg by mouth 3 (three) times daily.      promethazine 25 MG tablet  Commonly known as:  PHENERGAN  Take 1 tablet (25 mg total) by mouth every 8 (eight) hours as needed for nausea or vomiting.     simvastatin 40 MG tablet  Commonly known as:  ZOCOR  Take 1 tablet (40 mg total) by mouth at bedtime.     zolpidem 10 MG tablet  Commonly known as:  AMBIEN  Take 1 tablet (10 mg total) by mouth at bedtime as needed for sleep.         SignedNicolette Bang 12/23/2015, 8:35 PM

## 2015-12-23 NOTE — Evaluation (Signed)
Physical Therapy Evaluation Patient Details Name: [name] MRN: [id number] DOB: 1951/08/26 Today's Date: 12/23/2015   History of Present Illness  [name] is a 65 y.o. male with PMH of hypertension, hyperlipidemia, diabetes mellitus, R BKA,GERD, depression, PVD, kidney stone, CAD, who presents 12/21/15 with a fever and altered mental status.He underwent left PCNL on 4/6. He did well and was discharged home. He he has hematuria, dysuria and burning on urination. He has left flank pain.   Clinical Impression  The patient is able to participate in evaluation for bed mobility only. Patient is requesting to wait for his prosthesis  For easier transfer OOB. Wife to bring.  HR 120 sats >95 % on RA. C/o L flank pain with mobility.  Pt admitted with above diagnosis. Pt currently with functional limitations due to the deficits listed below (see PT Problem List).  Pt will benefit from skilled PT to increase their independence and safety with mobility to allow discharge to the venue listed below.       Follow Up Recommendations  (TBD- need to see how he progresses, may have R nephrostomy tube placed)    Equipment Recommendations  None recommended by PT    Recommendations for Other Services       Precautions / Restrictions Precautions Precautions: Fall Precaution Comments: cannot lay flat Other Brace/Splint: R BKA prosthesis Restrictions Weight Bearing Restrictions: No      Mobility  Bed Mobility Overal bed mobility: Needs Assistance Bed Mobility: Supine to Sit;Sit to Supine     Supine to sit: Supervision;HOB elevated Sit to supine: Min assist;HOB elevated   General bed mobility comments: assist the L leg,   Transfers                 General transfer comment: NT-wants prosthesis  Ambulation/Gait                Stairs            Wheelchair Mobility    Modified Rankin (Stroke Patients Only)       Balance                                              Pertinent Vitals/Pain Pain Assessment: 0-10 Pain Score: 8  Pain Location: L flank Pain Descriptors / Indicators: Grimacing;Guarding;Headache Pain Intervention(s): Limited activity within patient's tolerance;Monitored during session;Repositioned    Home Living Family/patient expects to be discharged to:: Private residence Living Arrangements: Spouse/significant other;Children Available Help at Discharge: Family Type of Home: House Home Access: Stairs to enter Entrance Stairs-Rails: Right Entrance Stairs-Number of Steps: 3 Home Layout: One level Home Equipment: Environmental consultant - 2 wheels;Cane - single point;Grab bars - tub/shower;Hand held shower head;Adaptive equipment;Wheelchair - [employer];Tub bench;Electric scooter      Prior Function Level of Independence: Needs assistance   Gait / Transfers Assistance Needed: Used w/c for community mobility; RW for in home mobility  ADL's / Homemaking Assistance Needed: Wife assisted with all ADLs and shower transfer. Pt able to don/doff R BKA prosthesis with setup        Hand Dominance        Extremity/Trunk Assessment   Upper Extremity Assessment: Defer to OT evaluation           Lower Extremity Assessment: RLE deficits/detail;LLE deficits/detail RLE Deficits / Details: BKA    Cervical / Trunk Assessment: Kyphotic  Communication      Cognition Arousal/Alertness: Awake/alert Behavior During Therapy: Flat affect Overall Cognitive Status: Within Functional Limits for tasks assessed                      General Comments      Exercises        Assessment/Plan    PT Assessment Patient needs continued PT services  PT Diagnosis Difficulty walking;Generalized weakness;Acute pain   PT Problem List Decreased strength;Decreased activity tolerance;Decreased mobility;Impaired sensation;Cardiopulmonary status limiting activity;Decreased knowledge of precautions;Decreased safety  awareness;Decreased knowledge of use of DME;Pain  PT Treatment Interventions DME instruction;Gait training;Functional mobility training;Stair training;Therapeutic activities;Therapeutic exercise;Patient/family education   PT Goals (Current goals can be found in the Care Plan section) Acute Rehab PT Goals Patient Stated Goal: to return home PT Goal Formulation: With patient Time For Goal Achievement: 01/06/16 Potential to Achieve Goals: Good    Frequency     Barriers to discharge        Co-evaluation               End of Session   Activity Tolerance: Patient limited by pain Patient left: in bed;with call bell/phone within reach Nurse Communication: Mobility status         Time: [id number] PT Time Calculation (min) (ACUTE ONLY): 25 min   Charges:   PT Evaluation $PT Eval Low Complexity: 1 Procedure PT Treatments $Therapeutic Activity: 8-22 mins   PT G Codes:        Claretha Cooper 12/23/2015, 9:33 AM  Tresa Endo PT [phone]

## 2015-12-23 NOTE — Op Note (Signed)
Preoperative diagnosis: Left renal stone  Postoperative diagnosis: Same  Procedure 1.  Left percutaneous nephrostolithotomy for stone greater than 2 cm 2.  Left nephrostogram 3.  Intraoperative fluoroscopy, under 1 hour, with interpretation 4.  Placement of a 6 x 26 double-J ureteral stent. 5.  Left ureteroscopy with lithotripsy and stone extraction 6.  Placement of a 77 French nephrostomy tube 7.  Dilation of percutaneous tract  Attending: Dr. Alyson Ingles  Anesthesia: General  Estimated blood loss: 150cc  Antibiotics: ancef  Drains: 1.  94 French Foley catheter 2.  6 x 26 left double-J ureteral stent 3.  18 French nephrostomy tube  Specimens: Stone for analysis  Findings: 2cm renal pelvis stone. Numerous 0.5-1cm lower pole calculi  Indications: Patient is a 65 year old male with a history of large left renal stones.  After discussing treatment options and decided she was left percutaneous nephrostolithotomy.  Patient already has a nephrostomy tube.  Procedure in detail: Prior to procedure consent was obtained.  Patient was brought to the operating room debridement was done to ensure correct patient, correct procedure, and correct site.  General anesthesia was administered.  A 16 French Foley catheter was in place.  The patient was then placed in the prone position.  His nephrostomy tube and left flank was then prepped and draped in usual sterile fashion.  A nephrostogram was obtained and findings noted above.  Through the nephrostomy tube we then placed a sensor wire.  Sensor wire was coiled in the renal pelvis we then removed the nephrostomy tube.  We then made an incision at the level of the skin and over the wire we then placed a NephroMax dilator.  We dilated the nephrostomy tract to 30 [place] and held this 18 cm of water for 1 minute.  We then  placed the access sheath over the balloon.  The balloon was then deflated.  We then used a rigid nephroscope to perform nephroscopy.  We  encountered a large renal pelvis stone that was impacted at the ureteropelvic junction.  We then used a 200 nm laser fiber to fragment the stone in multiple pieces.  Using an in [facility] removed stone fragments and sent for composition analysis.  Once the majority of the stone was removed we then were able to perform ureteroscopy with a flexible ureteroscope.  We advanced ureteroscope down to the level of bladder.  We then placed a second wire through the ureteroscope into the bladder.  We then removed the ureteroscope and over the wire placed a 6 x 26 double-J ureteral stent.  The wire was then removed and good coil was noted in the renal pelvis under direct vision in the bladder under fluoroscopy.  We then placed a 18 French nephrostomy tube through the sheath into the renal pelvis.  The balloon was inflated with 3 amounts of contrast.  We then removed the access sheath in and obtain another nephrostogram.  We noted minimal extravasation of contrast.  We then secured the nephrostomy tubes with 0 silks in interrupted fashion.  Dressing was placed over the nephrostomy tube site and this then concluded the procedure was well-tolerated by the patient.  Complications: None  Condition: Stable, extubated, transferred to PACU  Plan: Patient is to be admitted overnight for observation.  His Foley catheter through the morning.  He is then to be discharged home and followup in 1 weeks for R PCNL

## 2015-12-23 NOTE — Progress Notes (Signed)
Hypoglycemic Event  CBG:60  Treatment: 4 oz  Coke   Symptoms: none   Follow-up CBG: Time 1700 CBG Result:57    Possible Reasons for Event: High doses of concentrated Humulin (home regimen)  Comments/MD notified: Patient ate 90% of dinner tray and received 4 oz. Skim milk CBG 66@1720  patient received another 4 oz of coke CBG 113 @1750 --Dr. Algis Liming notified of hypoglycemic event. Spoke with Pharmacist. DM coordinator  Consult recommended.      [name], [name]

## 2015-12-23 NOTE — Progress Notes (Signed)
PROGRESS NOTE    [name]  [id number]  DOB: 01-Aug-1951  DOA: 12/21/2015 PCP: Wendie Agreste, MD Outpatient Specialists: Pain Management: Dr. Primus Bravo in [facility] course: 65 y.o. male with PMH of hypertension, hyperlipidemia, diabetes mellitus, GERD, depression, PVD, kidney stone, CAD, kidney stones, s/p left PCNL by Dr. Alyson Ingles on 4/6, discharged home on 4/7, presented to the ED same afternoon with complaints of fever, confusion, lethargy, hematuria, dysuria & left flank pain. In ED, patient was found to have positive urinalysis with moderate amount of leukocytes, WBC 13.2, temperature 104.4, tachycardia, tachypnea, oxygen desaturated to 83%, electrolytes renal function okay, lactate 1.44. Chest x-ray showed new patchy infiltration in RLL. Admitted to stepdown unit for sepsis secondary to acute pyelonephritis and possible HCAP. Urology consulted. Improving. Transferred to medical bed 4/9. Scheduled for right PCNL 4/10.  Assessment & Plan:   Postoperative Sepsis secondary to acute pyelonephritis and possible healthcare associated pneumonia (RLL) - Treated with IV fluids and started IV vancomycin and cefepime. - RSV panel and urine Legionella antigen: Pending. Flu panel PCR: Negative. Urinary pneumococcal antigen: Negative. Lactate normal. Pro-calcitonin 0.8. - Urine culture 1 and blood cultures 2: Pending - Chest x-ray was negative but pneumonia appeared on CT renal stone study. - MRSA PCR negative. DC vancomycin (upon chart review, received a dose of vancomycin on 4/8 AM.) - Improving. Continue current management.  Status post left PCNL 4/6/nephrolithiasis - Urology consultation and follow-up appreciated. - Patient had left nephrostomy tube with some pain at that site. Urology has removed left nephrostomy tube. Pain should improve. Adjusted pain medications. - Right PCNL planned for 4/10.  Essential hypertension - Reasonable control. Continue hydralazine when  necessary.  Chronic pain - Follows with pain management as outpatient. - States significant pain at left flank/PCNL site rated at 10/10 in severity. Left PCNL removed 4/9. Hopefully pain should improve. Titrate pain medications and monitor.  HLD:  - Last LDL was 50 on 12/06/15  - Continue home medications: Zocor  Depression and anxiety: - Continue home medications: ativan   DM-II/IDDM:  - Last A1c 7.3 on 12/06/15, fairly controled. Patient is taking sliding scale insulin at home - Patient is on high-dose insulin's at home i.e. U 500. Discussed at length with patient's spouse on 4/8. She stated that patient was on home regimen of these insulins during recent hospitalization by urology and when he had a TIA and was admitted at [facility].  - Discussed extensively with pharmacy on 4/8 and placed patient back on his home regimen of U5 100. Mildly uncontrolled CBGs. Monitor closely.   CAD:  - No CP - continue ASA and Zocor  GERD/large hiatal hernia: - Protonix   Confusion - Likely secondary to sepsis. Resolved.  Morbid obesity/Body mass index is 36.25 kg/(m^2).       DVT prophylaxis: SCDs Code Status: Full Family Communication: Discussed with patient's spouse at bedside on 4/9. Disposition Plan: Admitted to stepdown. Transfer to medical floor 4/9. DC home when medically stable.  Consultants:  Urology  Procedures:  Status post left PCNL 4/6-removed 4/19.  Antimicrobials:  Cefepime 4/7 >  IV vancomycin 1 dose.   Subjective: Seen this morning before removal of left percutaneous nephrostomy and continue to complain of severe pain at that site. No other complaints. As per RN, no acute issues.  Objective: Filed Vitals:   12/23/15 0600 12/23/15 0700 12/23/15 0800 12/23/15 0900  BP: 138/47  151/57   Pulse: 83 75 94 84  Temp:   98.8  F (37.1 C)   TempSrc:   Oral   Resp: 15 14 15 13   Height:      Weight:      SpO2: 96% 98% 97% 98%    Intake/Output Summary (Last 24  hours) at 12/23/15 1106 Last data filed at 12/23/15 0900  Gross per 24 hour  Intake   1415 ml  Output   2650 ml  Net  -1235 ml   Filed Weights   12/22/15 0312  Weight: 124.6 kg (274 lb 11.1 oz)    Exam:  General exam: Moderately built and morbidly obese male lying comfortably propped up in bed.  Respiratory system: diminished breath sounds in the bases but otherwise clear to auscultation. No increased work of breathing. Cardiovascular system: S1 & S2 heard, RRR. No JVD, murmurs, gallops, clicks or pedal edema. Telemetry: SR.  Gastrointestinal system: Abdomen is nondistended, soft and nontender. Normal bowel sounds heard. Left PCN draining mildly concentrated urine.  Central nervous system: Alert and oriented. No focal neurological deficits. Extremities: Symmetric 5 x 5 power. Healed right BKA stump.   Data Reviewed: Basic Metabolic Panel:  Recent Labs Lab 12/19/15 0745 12/20/15 1216 12/21/15 0446 12/21/15 2131 12/23/15 0343  NA 137 136 140 136 136  K 4.2 4.0 4.6 3.9 3.8  CL 104 102 104 100* 105  CO2 24 25 29 24 23   GLUCOSE 349* 266* 224* 168* 163*  BUN 10 11 10 12 7   CREATININE 0.90 0.98 0.94 1.00 0.72  CALCIUM 9.7 9.1 9.5 9.8 9.3   Liver Function Tests:  Recent Labs Lab 12/21/15 2131  AST 19  ALT 16*  ALKPHOS 133*  BILITOT 1.5*  PROT 7.2  ALBUMIN 3.8   No results for input(s): LIPASE, AMYLASE in the last 168 hours. No results for input(s): AMMONIA in the last 168 hours. CBC:  Recent Labs Lab 12/19/15 0745 12/20/15 1216 12/21/15 0446 12/21/15 2131 12/23/15 0343  WBC 10.2 11.4* 10.5 13.2* 7.3  NEUTROABS 6.7  --   --  11.0*  --   HGB 12.3* 11.7* 10.8* 12.6* 10.2*  HCT 37.1* 34.9* 32.6* 37.8* 29.3*  MCV 102.5* 102.6* 103.2* 102.2* 97.3  PLT 385 371 341 392 324   Cardiac Enzymes: No results for input(s): CKTOTAL, CKMB, CKMBINDEX, TROPONINI in the last 168 hours. BNP (last 3 results) No results for input(s): PROBNP in the last 8760  hours. CBG:  Recent Labs Lab 12/22/15 0822 12/22/15 1215 12/22/15 1624 12/22/15 2142 12/23/15 0800  GLUCAP 213* 204* 267* 226* 170*    Recent Results (from the past 240 hour(s))  MRSA PCR Screening     Status: None   Collection Time: 12/22/15  4:22 AM  Result Value Ref Range Status   MRSA by PCR NEGATIVE NEGATIVE Final    Comment:        The GeneXpert MRSA Assay (FDA approved for NASAL specimens only), is one component of a comprehensive MRSA colonization surveillance program. It is not intended to diagnose MRSA infection nor to guide or monitor treatment for MRSA infections.   Culture, sputum-assessment     Status: None   Collection Time: 12/23/15  6:49 AM  Result Value Ref Range Status   Specimen Description SPUTUM  Final   Special Requests NONE  Final   Sputum evaluation   Final    THIS SPECIMEN IS ACCEPTABLE. RESPIRATORY CULTURE REPORT TO FOLLOW.   Report Status 12/23/2015 FINAL  Final         Studies: Dg Chest Scnetx 1 [address]  12/21/2015  CLINICAL DATA:  Sepsis.  Fever and tachycardia. EXAM: PORTABLE CHEST 1 VIEW COMPARISON:  12/07/2015 FINDINGS: Chronic right chest wall deformities and pleural thickening due to remote rib fractures. Mild chronic interstitial coarsening. No confluent alveolar opacity. No large effusions. Unchanged cardiomegaly and aortic tortuosity. IMPRESSION: No acute cardiopulmonary findings. Electronically Signed   By: Andreas Newport M.D.   On: 12/21/2015 21:35   Ct Renal Stone Study  12/22/2015  CLINICAL DATA:  Fever and confusion one day after percutaneous nephrolithotomy. EXAM: CT ABDOMEN AND PELVIS WITHOUT CONTRAST TECHNIQUE: Multidetector CT imaging of the abdomen and pelvis was performed following the standard protocol without IV contrast. COMPARISON:  11/05/2015 FINDINGS: There is a percutaneous nephrostomy on the left, extending into the left renal pelvis. There is a left ureteral stent with proximal end in the left renal pelvis and  distal end in the urinary bladder. No calculi are evident within the ureter. There is near complete removal of calculi from the left renal collecting system. There are 2 tiny residual calculus fragments in a left lower pole calyx, measuring up to 3 mm. There is a 3 x 4 mm calculus in a posterior left midpole calyx. There is mild stranding around the left renal pelvis and left ureter but no urinoma or other abnormal fluid collection. No perinephric hematoma. The right collecting system contains numerous calculi measuring up to 2 cm. This is unchanged from 11/05/2015. There are unremarkable unenhanced appearances of the liver, spleen, pancreas, biliary system and adrenals. The abdominal aorta is normal in caliber. There is mild atherosclerotic calcification. There is no adenopathy in the abdomen or pelvis. There is a large hiatal hernia.  Bowel is otherwise unremarkable. There is new patchy airspace opacity in the right lower lobe. IMPRESSION: 1. Left percutaneous nephrostomy catheter and left ureteral stent appear unchanged in position from the intra procedural images of 12/20/2015. Near complete removal of calculi from the left renal collecting system. No hematoma, urinoma or other immediate complication is evident. No ureteral calculus. 2. Unchanged right nephrolithiasis. 3. New patchy airspace opacity in the right lower lobe posteriorly may represent pneumonia or aspiration. 4. Large hiatal hernia. Electronically Signed   By: Andreas Newport M.D.   On: 12/22/2015 01:14        Scheduled Meds: . ceFEPime (MAXIPIME) IV  1 g Intravenous 3 times per day  . docusate sodium  250 mg Oral Daily  . insulin regular human CONCENTRATED  25-105 Units Subcutaneous QAC breakfast  . insulin regular human CONCENTRATED  25-85 Units Subcutaneous QAC lunch  . insulin regular human CONCENTRATED  25-85 Units Subcutaneous QAC supper  . pantoprazole  40 mg Oral Daily  . pregabalin  100 mg Oral TID  . simvastatin  40 mg  Oral QHS   Continuous Infusions: . 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 [facility] Pager [phone]  If 7PM-7AM, please contact night-coverage [email or web address] Password [email or web address] 12/23/2015, 11:06 AM    LOS: 1 day

## 2015-12-24 ENCOUNTER — Other Ambulatory Visit (HOSPITAL_COMMUNITY): Payer: Medicare Other

## 2015-12-24 ENCOUNTER — Ambulatory Visit (HOSPITAL_COMMUNITY): Admission: RE | Admit: 2015-12-24 | Payer: Medicare Other | Source: Ambulatory Visit | Admitting: Urology

## 2015-12-24 ENCOUNTER — Inpatient Hospital Stay (HOSPITAL_COMMUNITY): Payer: Medicare Other

## 2015-12-24 ENCOUNTER — Inpatient Hospital Stay (HOSPITAL_COMMUNITY): Payer: Medicare Other | Admitting: Anesthesiology

## 2015-12-24 ENCOUNTER — Ambulatory Visit (HOSPITAL_COMMUNITY): Payer: Medicare Other

## 2015-12-24 ENCOUNTER — Encounter (HOSPITAL_COMMUNITY): Payer: Self-pay | Admitting: General Surgery

## 2015-12-24 ENCOUNTER — Encounter (HOSPITAL_COMMUNITY)
Admission: EM | Disposition: A | Discharge status: Home Health | Payer: Self-pay | Source: Home / Self Care | Attending: Internal Medicine

## 2015-12-24 HISTORY — PX: NEPHROLITHOTOMY: SHX5134

## 2015-12-24 LAB — RESPIRATORY VIRUS PANEL
ADENOVIRUS: NEGATIVE
INFLUENZA A: NEGATIVE
Influenza B: NEGATIVE
Metapneumovirus: NEGATIVE
Parainfluenza 1: NEGATIVE
Parainfluenza 2: NEGATIVE
Parainfluenza 3: NEGATIVE
RESPIRATORY SYNCYTIAL VIRUS A: NEGATIVE
RESPIRATORY SYNCYTIAL VIRUS B: NEGATIVE
Rhinovirus: NEGATIVE

## 2015-12-24 LAB — GLUCOSE, CAPILLARY
GLUCOSE-CAPILLARY: 162 mg/dL — AB (ref 65–99)
GLUCOSE-CAPILLARY: 225 mg/dL — AB (ref 65–99)
GLUCOSE-CAPILLARY: 368 mg/dL — AB (ref 65–99)
Glucose-Capillary: 213 mg/dL — ABNORMAL HIGH (ref 65–99)
Glucose-Capillary: 215 mg/dL — ABNORMAL HIGH (ref 65–99)
Glucose-Capillary: 213 mg/dL — ABNORMAL HIGH (ref 65–99)
Glucose-Capillary: 240 mg/dL — ABNORMAL HIGH (ref 65–99)
Glucose-Capillary: 249 mg/dL — ABNORMAL HIGH (ref 65–99)
Glucose-Capillary: 279 mg/dL — ABNORMAL HIGH (ref 65–99)
Glucose-Capillary: 247 mg/dL — ABNORMAL HIGH (ref 65–99)

## 2015-12-24 LAB — BASIC METABOLIC PANEL
Anion gap: 10 (ref 5–15)
BUN: 9 mg/dL (ref 6–20)
CHLORIDE: 106 mmol/L (ref 101–111)
CO2: 21 mmol/L — ABNORMAL LOW (ref 22–32)
Calcium: 9.4 mg/dL (ref 8.9–10.3)
Creatinine, Ser: 0.83 mg/dL (ref 0.61–1.24)
Glucose, Bld: 314 mg/dL — ABNORMAL HIGH (ref 65–99)
POTASSIUM: 4.3 mmol/L (ref 3.5–5.1)
SODIUM: 137 mmol/L (ref 135–145)

## 2015-12-24 LAB — LEGIONELLA PNEUMOPHILA SEROGP 1 UR AG: L. pneumophila Serogp 1 Ur Ag: NEGATIVE

## 2015-12-24 SURGERY — NEPHROLITHOTOMY PERCUTANEOUS
Anesthesia: General | Site: Ureter | Laterality: Right

## 2015-12-24 MED ORDER — ONDANSETRON HCL 4 MG/2ML IJ SOLN
4.0000 mg | Freq: Once | INTRAMUSCULAR | Status: AC
  Administered 2015-12-24: 4 mg via INTRAVENOUS

## 2015-12-24 MED ORDER — ROCURONIUM BROMIDE 100 MG/10ML IV SOLN
INTRAVENOUS | Status: DC | PRN
  Administered 2015-12-24: 40 mg via INTRAVENOUS
  Administered 2015-12-24: 20 mg via INTRAVENOUS

## 2015-12-24 MED ORDER — FENTANYL CITRATE (PF) 100 MCG/2ML IJ SOLN
INTRAMUSCULAR | Status: AC
  Filled 2015-12-24: qty 2

## 2015-12-24 MED ORDER — MIDAZOLAM HCL 5 MG/5ML IJ SOLN
INTRAMUSCULAR | Status: DC | PRN
  Administered 2015-12-24: 2 mg via INTRAVENOUS

## 2015-12-24 MED ORDER — LIDOCAINE HCL (CARDIAC) 20 MG/ML IV SOLN
INTRAVENOUS | Status: DC | PRN
  Administered 2015-12-24: 100 mg via INTRAVENOUS

## 2015-12-24 MED ORDER — DEXAMETHASONE SODIUM PHOSPHATE 10 MG/ML IJ SOLN
INTRAMUSCULAR | Status: AC
  Filled 2015-12-24: qty 1

## 2015-12-24 MED ORDER — FENTANYL CITRATE (PF) 100 MCG/2ML IJ SOLN
25.0000 ug | INTRAMUSCULAR | Status: AC | PRN
Start: 2015-12-24 — End: 2015-12-24
  Administered 2015-12-24 (×6): 50 ug via INTRAVENOUS

## 2015-12-24 MED ORDER — SODIUM CHLORIDE 0.9 % IR SOLN
Status: DC | PRN
  Administered 2015-12-24: 6000 mL

## 2015-12-24 MED ORDER — PROPOFOL 10 MG/ML IV BOLUS
INTRAVENOUS | Status: DC | PRN
  Administered 2015-12-24: 200 mg via INTRAVENOUS

## 2015-12-24 MED ORDER — IOPAMIDOL (ISOVUE-300) INJECTION 61%
INTRAVENOUS | Status: DC | PRN
  Administered 2015-12-24: 50 mL

## 2015-12-24 MED ORDER — LIDOCAINE HCL 1 % IJ SOLN
INTRAMUSCULAR | Status: AC | PRN
  Administered 2015-12-24: 10 mL

## 2015-12-24 MED ORDER — SUCCINYLCHOLINE CHLORIDE 20 MG/ML IJ SOLN
INTRAMUSCULAR | Status: DC | PRN
  Administered 2015-12-24: 100 mg via INTRAVENOUS

## 2015-12-24 MED ORDER — LACTATED RINGERS IV SOLN: INTRAVENOUS | Status: DC

## 2015-12-24 MED ORDER — INSULIN ASPART 100 UNIT/ML ~~LOC~~ SOLN
SUBCUTANEOUS | Status: AC
  Filled 2015-12-24: qty 1

## 2015-12-24 MED ORDER — INSULIN ASPART 100 UNIT/ML ~~LOC~~ SOLN: 5.0000 [IU] | Freq: Once | SUBCUTANEOUS | Status: DC

## 2015-12-24 MED ORDER — IOPAMIDOL (ISOVUE-300) INJECTION 61%
INTRAVENOUS | Status: AC
  Filled 2015-12-24: qty 100

## 2015-12-24 MED ORDER — 0.9 % SODIUM CHLORIDE (POUR BTL) OPTIME
TOPICAL | Status: DC | PRN
  Administered 2015-12-24: 1000 mL

## 2015-12-24 MED ORDER — DEXAMETHASONE SODIUM PHOSPHATE 4 MG/ML IJ SOLN
10.0000 mg | Freq: Once | INTRAMUSCULAR | Status: AC
  Administered 2015-12-24: 10 mg via INTRAVENOUS

## 2015-12-24 MED ORDER — TRAZODONE HCL 50 MG PO TABS
100.0000 mg | ORAL_TABLET | Freq: Every evening | ORAL | Status: DC | PRN
  Administered 2015-12-24: 100 mg via ORAL
  Filled 2015-12-24: qty 2

## 2015-12-24 MED ORDER — FENTANYL CITRATE (PF) 100 MCG/2ML IJ SOLN
INTRAMUSCULAR | Status: DC | PRN
  Administered 2015-12-24 (×10): 50 ug via INTRAVENOUS

## 2015-12-24 MED ORDER — LIDOCAINE HCL 1 % IJ SOLN
INTRAMUSCULAR | Status: AC
  Filled 2015-12-24: qty 20

## 2015-12-24 MED ORDER — FENTANYL CITRATE (PF) 100 MCG/2ML IJ SOLN
INTRAMUSCULAR | Status: AC
  Filled 2015-12-24: qty 4

## 2015-12-24 MED ORDER — PROPOFOL 10 MG/ML IV BOLUS
INTRAVENOUS | Status: AC
  Filled 2015-12-24: qty 20

## 2015-12-24 MED ORDER — FENTANYL CITRATE (PF) 100 MCG/2ML IJ SOLN
INTRAMUSCULAR | Status: AC | PRN
  Administered 2015-12-24: 25 ug via INTRAVENOUS
  Administered 2015-12-24: 50 ug via INTRAVENOUS
  Administered 2015-12-24: 25 ug via INTRAVENOUS

## 2015-12-24 MED ORDER — SUGAMMADEX SODIUM 500 MG/5ML IV SOLN
INTRAVENOUS | Status: DC | PRN
  Administered 2015-12-24: 250 mg via INTRAVENOUS

## 2015-12-24 MED ORDER — LACTATED RINGERS IV SOLN
INTRAVENOUS | Status: DC | PRN
  Administered 2015-12-24: 12:00:00 via INTRAVENOUS

## 2015-12-24 MED ORDER — FENTANYL CITRATE (PF) 100 MCG/2ML IJ SOLN
25.0000 ug | INTRAMUSCULAR | Status: DC | PRN
  Administered 2015-12-24 (×4): 50 ug via INTRAVENOUS

## 2015-12-24 MED ORDER — ONDANSETRON HCL 4 MG/2ML IJ SOLN
INTRAMUSCULAR | Status: DC | PRN
  Administered 2015-12-24: 4 mg via INTRAVENOUS

## 2015-12-24 MED ORDER — MIDAZOLAM HCL 2 MG/2ML IJ SOLN
INTRAMUSCULAR | Status: AC | PRN
  Administered 2015-12-24 (×6): 1 mg via INTRAVENOUS

## 2015-12-24 MED ORDER — IOPAMIDOL (ISOVUE-300) INJECTION 61%
50.0000 mL | Freq: Once | INTRAVENOUS | Status: AC | PRN
Start: 2015-12-24 — End: 2015-12-24
  Administered 2015-12-24: 20 mL

## 2015-12-24 MED ORDER — MIDAZOLAM HCL 2 MG/2ML IJ SOLN
INTRAMUSCULAR | Status: AC
  Filled 2015-12-24: qty 6

## 2015-12-24 MED ORDER — INSULIN REGULAR HUMAN (CONC) 500 UNIT/ML ~~LOC~~ SOPN
75.0000 [IU] | PEN_INJECTOR | Freq: Once | SUBCUTANEOUS | Status: AC
  Administered 2015-12-24: 75 [IU] via SUBCUTANEOUS

## 2015-12-24 MED ORDER — FENTANYL CITRATE (PF) 250 MCG/5ML IJ SOLN
INTRAMUSCULAR | Status: AC
Start: 2015-12-24 — End: 2015-12-24
  Filled 2015-12-24: qty 5

## 2015-12-24 MED ORDER — SUGAMMADEX SODIUM 500 MG/5ML IV SOLN
INTRAVENOUS | Status: AC
  Filled 2015-12-24: qty 5

## 2015-12-24 MED ORDER — ONDANSETRON HCL 4 MG/2ML IJ SOLN
INTRAMUSCULAR | Status: AC
  Filled 2015-12-24: qty 2

## 2015-12-24 MED ORDER — LABETALOL HCL 5 MG/ML IV SOLN
INTRAVENOUS | Status: DC | PRN
  Administered 2015-12-24: 5 mg via INTRAVENOUS

## 2015-12-24 MED ORDER — PROCHLORPERAZINE EDISYLATE 5 MG/ML IJ SOLN
10.0000 mg | Freq: Once | INTRAMUSCULAR | Status: AC
  Administered 2015-12-24: 10 mg via INTRAVENOUS
  Filled 2015-12-24 (×2): qty 2

## 2015-12-24 MED ORDER — FENTANYL CITRATE (PF) 250 MCG/5ML IJ SOLN
INTRAMUSCULAR | Status: AC
  Filled 2015-12-24: qty 5

## 2015-12-24 MED ORDER — MIDAZOLAM HCL 2 MG/2ML IJ SOLN
INTRAMUSCULAR | Status: AC
  Filled 2015-12-24: qty 2

## 2015-12-24 MED ORDER — ROCURONIUM BROMIDE 100 MG/10ML IV SOLN
INTRAVENOUS | Status: AC
Start: 2015-12-24 — End: 2015-12-24
  Filled 2015-12-24: qty 1

## 2015-12-24 MED ORDER — INSULIN ASPART 100 UNIT/ML ~~LOC~~ SOLN
15.0000 [IU] | Freq: Once | SUBCUTANEOUS | Status: AC
  Administered 2015-12-24: 15 [IU] via SUBCUTANEOUS

## 2015-12-24 MED ORDER — INSULIN REGULAR HUMAN (CONC) 500 UNIT/ML ~~LOC~~ SOPN
15.0000 [IU] | PEN_INJECTOR | Freq: Once | SUBCUTANEOUS | Status: AC
  Administered 2015-12-24: 15 [IU] via SUBCUTANEOUS
  Filled 2015-12-24: qty 3

## 2015-12-24 SURGICAL SUPPLY — 65 items
APL SKNCLS STERI-STRIP NONHPOA (GAUZE/BANDAGES/DRESSINGS) ×4
BAG URINE DRAINAGE (UROLOGICAL SUPPLIES) ×8 IMPLANT
BASKET STONE NITINOL 3FRX115MB (UROLOGICAL SUPPLIES) ×3 IMPLANT
BASKET ZERO TIP NITINOL 2.4FR (BASKET) IMPLANT
BENZOIN TINCTURE PRP APPL 2/3 (GAUZE/BANDAGES/DRESSINGS) ×8 IMPLANT
BLADE SURG 15 STRL LF DISP TIS (BLADE) ×2 IMPLANT
BLADE SURG 15 STRL SS (BLADE) ×4
BSKT STON RTRVL ZERO TP 2.4FR (BASKET)
CATCHER STONE W/TUBE ADAPTER (UROLOGICAL SUPPLIES) IMPLANT
CATH FOLEY 2W COUNCIL 20FR 5CC (CATHETERS) ×3 IMPLANT
CATH FOLEY 2WAY SLVR  5CC 16FR (CATHETERS) ×2
CATH FOLEY 2WAY SLVR  5CC 18FR (CATHETERS)
CATH FOLEY 2WAY SLVR 5CC 16FR (CATHETERS) ×2 IMPLANT
CATH FOLEY 2WAY SLVR 5CC 18FR (CATHETERS) ×1 IMPLANT
CATH IMAGER II 65CM (CATHETERS) ×1 IMPLANT
CATH INTERMIT  6FR 70CM (CATHETERS) ×4 IMPLANT
CATH ROBINSON RED A/P 20FR (CATHETERS) IMPLANT
CATH URET DUAL LUMEN 6-10FR 50 (CATHETERS) IMPLANT
CATH X-FORCE N30 NEPHROSTOMY (TUBING) ×4 IMPLANT
COVER SURGICAL LIGHT HANDLE (MISCELLANEOUS) ×4 IMPLANT
DRAPE C-ARM 42X120 X-RAY (DRAPES) ×4 IMPLANT
DRAPE LINGEMAN PERC (DRAPES) ×4 IMPLANT
DRAPE SHEET LG 3/4 BI-LAMINATE (DRAPES) ×4 IMPLANT
DRAPE SURG IRRIG POUCH 19X23 (DRAPES) ×4 IMPLANT
DRSG PAD ABDOMINAL 8X10 ST (GAUZE/BANDAGES/DRESSINGS) ×5 IMPLANT
DRSG TEGADERM 8X12 (GAUZE/BANDAGES/DRESSINGS) ×5 IMPLANT
FIBER LASER FLEXIVA 1000 (UROLOGICAL SUPPLIES) IMPLANT
FIBER LASER FLEXIVA 200 (UROLOGICAL SUPPLIES) ×3 IMPLANT
FIBER LASER FLEXIVA 365 (UROLOGICAL SUPPLIES) IMPLANT
FIBER LASER FLEXIVA 550 (UROLOGICAL SUPPLIES) IMPLANT
FIBER LASER TRAC TIP (UROLOGICAL SUPPLIES) IMPLANT
GAUZE SPONGE 4X4 12PLY STRL (GAUZE/BANDAGES/DRESSINGS) ×4 IMPLANT
GLOVE BIO SURGEON STRL SZ8 (GLOVE) ×12 IMPLANT
GLOVE BIOGEL PI IND STRL 8 (GLOVE) ×1 IMPLANT
GLOVE BIOGEL PI INDICATOR 8 (GLOVE) ×2
GOWN STRL REUS W/TWL LRG LVL3 (GOWN DISPOSABLE) ×8 IMPLANT
GOWN STRL REUS W/TWL XL LVL3 (GOWN DISPOSABLE) ×4 IMPLANT
GUIDEWIRE AMPLAZ .035X145 (WIRE) ×4 IMPLANT
GUIDEWIRE STR DUAL SENSOR (WIRE) ×8 IMPLANT
INSERT SILICONE FOR G14464 (MISCELLANEOUS) ×1 IMPLANT
KIT BASIN OR (CUSTOM PROCEDURE TRAY) ×4 IMPLANT
MANIFOLD NEPTUNE II (INSTRUMENTS) ×4 IMPLANT
NDL TROCAR 18X15 ECHO (NEEDLE) IMPLANT
NDL TROCAR 18X20 (NEEDLE) IMPLANT
NEEDLE TROCAR 18X15 ECHO (NEEDLE) IMPLANT
NEEDLE TROCAR 18X20 (NEEDLE) IMPLANT
NS IRRIG 1000ML POUR BTL (IV SOLUTION) ×4 IMPLANT
PACK CYSTO (CUSTOM PROCEDURE TRAY) ×4 IMPLANT
PROBE LITHOCLAST ULTRA 3.8X403 (UROLOGICAL SUPPLIES) ×3 IMPLANT
PROBE PNEUMATIC 1.0MMX570MM (UROLOGICAL SUPPLIES) ×3 IMPLANT
SET AMPLATZ RENAL DILATOR (MISCELLANEOUS) IMPLANT
SET IRRIG Y TYPE TUR BLADDER L (SET/KITS/TRAYS/PACK) ×1 IMPLANT
SHEATH PEELAWAY SET 9 (SHEATH) ×6 IMPLANT
SPONGE LAP 4X18 X RAY DECT (DISPOSABLE) ×4 IMPLANT
STENT CONTOUR 6FRX26X.038 (STENTS) IMPLANT
STENT URET 6FRX26 CONTOUR (STENTS) ×3 IMPLANT
STONE CATCHER W/TUBE ADAPTER (UROLOGICAL SUPPLIES) ×4 IMPLANT
SUT SILK 2 0 30  PSL (SUTURE) ×2
SUT SILK 2 0 30 PSL (SUTURE) ×2 IMPLANT
SYR 20CC LL (SYRINGE) ×8 IMPLANT
SYRINGE 10CC LL (SYRINGE) ×4 IMPLANT
TOWEL OR 17X26 10 PK STRL BLUE (TOWEL DISPOSABLE) ×4 IMPLANT
TUBING CONNECTING 10 (TUBING) ×6 IMPLANT
TUBING CONNECTING 10' (TUBING) ×2
WATER STERILE IRR 1500ML POUR (IV SOLUTION) ×4 IMPLANT
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
## 2015-12-24 NOTE — Anesthesia Postprocedure Evaluation (Signed)
Anesthesia Post Note  Patient: [name]  Procedure(s) Performed: Procedure(s) (LRB): RIGHT  PERCUTANEOUS NEPHROLITHOTOMY right double j stent (Right)  Patient location during evaluation: PACU Anesthesia Type: General Level of consciousness: awake and alert Pain management: pain level controlled Vital Signs Assessment: post-procedure vital signs reviewed and stable Respiratory status: spontaneous breathing, nonlabored ventilation, respiratory function stable and patient connected to nasal cannula oxygen Cardiovascular status: blood pressure returned to baseline and stable Postop Assessment: no signs of nausea or vomiting Anesthetic complications: no    Last Vitals:  Filed Vitals:   12/24/15 1600 12/24/15 1615  BP: 168/75   Pulse: 103   Temp: 37.2 C 37.2 C  Resp: 14 14    Last Pain:  Filed Vitals:   12/24/15 1625  PainSc: 10-Worst pain ever                 Mercie Balsley EDWARD

## 2015-12-24 NOTE — Progress Notes (Signed)
Pt has been NPO since MN and we have been doing q2 hour CBG's.  They have been 213, 215 & 213 since MN.  No coverage ordered for these.  Wife requesting that pt have half of the normal concentrated 500U/ML ssi which would be 30Units.  Spoke with Kathline Magic NP on call who has allowed a one time dose of 15Units.  It has been given at 0500 and we will continue to check CBG's q2.  Next one will be at 0600.  Pt educated to alert staff if he has any s/s of hypoglycemia and pt verbalizes understanding.

## 2015-12-24 NOTE — Progress Notes (Signed)
PROGRESS NOTE    [name]  [name]  DOB: 1951/07/29  DOA: 12/21/2015 PCP: Wendie Agreste, MD Outpatient Specialists: Pain Management: Dr. Primus Bravo in [facility] course: 65 y.o. male with PMH of hypertension, hyperlipidemia, diabetes mellitus, GERD, depression, PVD, kidney stone, CAD, kidney stones, s/p left PCNL by Dr. Alyson Ingles on 4/6, discharged home on 4/7, presented to the ED same afternoon with complaints of fever, confusion, lethargy, hematuria, dysuria & left flank pain. In ED, patient was found to have positive urinalysis with moderate amount of leukocytes, WBC 13.2, temperature 104.4, tachycardia, tachypnea, oxygen desaturated to 83%, electrolytes renal function okay, lactate 1.44. Chest x-ray showed new patchy infiltration in RLL. Admitted to stepdown unit for sepsis secondary to acute pyelonephritis and possible HCAP. Urology consulted. Improving. Transferred to medical bed 4/9. Scheduled for right PCNL 4/10.  Assessment & Plan:   Postoperative Sepsis secondary to acute pyelonephritis and possible healthcare associated pneumonia (RLL) - Treated with IV fluids and started IV vancomycin and cefepime. - RSV panel and urine Legionella antigen: Pending. Flu panel PCR: Negative. Urinary pneumococcal antigen: Negative. Lactate normal. Pro-calcitonin 0.8. - Urine culture 1: Negative and blood cultures 2: Negative to date. - Chest x-ray was negative but pneumonia appeared on CT renal stone study. - MRSA PCR negative. DC vancomycin (upon chart review, received a dose of vancomycin on 4/8 AM.) - Improving. Continue current management.  Status post left PCNL 4/6/nephrolithiasis - Urology consultation and follow-up appreciated. - Patient had left nephrostomy tube with some pain at that site. Urology has removed left nephrostomy tube on 4/9. Left flank pain significantly improved.  - Right PCNL planned for 4/10.  Essential hypertension - Reasonable control.  Continue hydralazine when necessary.  Chronic pain - Follows with pain management as outpatient. - Left PCNL removed 4/9 and left flank pain has significantly improved.  HLD:  - Last LDL was 50 on 12/06/15  - Continue home medications: Zocor  Depression and anxiety: - Continue home medications: ativan   DM-II/IDDM:  - Last A1c 7.3 on 12/06/15, fairly controled. Patient is taking sliding scale insulin at home - Patient is on high-dose insulin's at home i.e. U 500. Discussed at length with patient's spouse on 4/8. She stated that patient was on home regimen of these insulins during recent hospitalization by urology and when he had a TIA and was admitted at [facility].  - Discussed extensively with pharmacy on 4/8 and placed patient back on his home regimen of U5 100. Mildly uncontrolled CBGs. Monitor closely. - Hypoglycemic episode on 4/9 afternoon. Treated appropriately and CBGs in the low 200s. Received a reduced dose insulin d/t NPO for procedure this morning. Resume home regimen post procedure. - Diabetes coordinator consulted.   CAD:  - No CP - continue ASA and Zocor  GERD/large hiatal hernia: - Protonix   Confusion - Likely secondary to sepsis. Resolved.  Morbid obesity/Body mass index is 35.64 kg/(m^2).       DVT prophylaxis: SCDs Code Status: Full Family Communication: Discussed with patient's spouse at bedside on 4/10. Disposition Plan: Admitted to stepdown. Transfer to medical floor 4/9. DC home when medically stable.  Consultants:  Urology  Procedures:  Status post left PCNL 4/6-removed 4/19.  Antimicrobials:  Cefepime 4/7 >  IV vancomycin 1 dose.   Subjective: Left flank pain significantly improved. Mild nausea. Denies any other complaints. Seen prior to procedure this morning.  Objective: Filed Vitals:   12/23/15 1600 12/23/15 1643 12/23/15 2048 12/24/15 0617  BP: 179/46  158/61 162/78 163/62  Pulse: 97 121 87 83  Temp: 98.3 F (36.8 C) 98.9 F  (37.2 C) 99.5 F (37.5 C) 95.5 F (35.3 C)  TempSrc: Oral Oral Oral Oral  Resp:  20 14 16   Height:  6\' 1"  (1.854 m)    Weight:  122.5 kg (270 lb 1 oz)    SpO2: 92% 99% 99% 94%    Intake/Output Summary (Last 24 hours) at 12/24/15 [id number] Last data filed at 12/24/15 0600  Gross per 24 hour  Intake 3819.99 ml  Output   2700 ml  Net 1119.99 ml   Filed Weights   12/22/15 0312 12/23/15 1643  Weight: 124.6 kg (274 lb 11.1 oz) 122.5 kg (270 lb 1 oz)    Exam:  General exam: Moderately built and morbidly obese male lying comfortably propped up in bed.  Respiratory system: clear to auscultation. No increased work of breathing. Cardiovascular system: S1 & S2 heard, RRR. No JVD, murmurs, gallops, clicks or pedal edema.  Gastrointestinal system: Abdomen is nondistended, soft and nontender. Normal bowel sounds heard.  Central nervous system: Alert and oriented. No focal neurological deficits. Extremities: Symmetric 5 x 5 power. Healed right BKA stump.   Data Reviewed: Basic Metabolic Panel:  Recent Labs Lab 12/19/15 0745 12/20/15 1216 12/21/15 0446 12/21/15 2131 12/23/15 0343  NA 137 136 140 136 136  K 4.2 4.0 4.6 3.9 3.8  CL 104 102 104 100* 105  CO2 24 25 29 24 23   GLUCOSE 349* 266* 224* 168* 163*  BUN 10 11 10 12 7   CREATININE 0.90 0.98 0.94 1.00 0.72  CALCIUM 9.7 9.1 9.5 9.8 9.3   Liver Function Tests:  Recent Labs Lab 12/21/15 2131  AST 19  ALT 16*  ALKPHOS 133*  BILITOT 1.5*  PROT 7.2  ALBUMIN 3.8   No results for input(s): LIPASE, AMYLASE in the last 168 hours. No results for input(s): AMMONIA in the last 168 hours. CBC:  Recent Labs Lab 12/19/15 0745 12/20/15 1216 12/21/15 0446 12/21/15 2131 12/23/15 0343  WBC 10.2 11.4* 10.5 13.2* 7.3  NEUTROABS 6.7  --   --  11.0*  --   HGB 12.3* 11.7* 10.8* 12.6* 10.2*  HCT 37.1* 34.9* 32.6* 37.8* 29.3*  MCV 102.5* 102.6* 103.2* 102.2* 97.3  PLT 385 371 341 392 324   Cardiac Enzymes: No results for input(s):  CKTOTAL, CKMB, CKMBINDEX, TROPONINI in the last 168 hours. BNP (last 3 results) No results for input(s): PROBNP in the last 8760 hours. CBG:  Recent Labs Lab 12/23/15 2053 12/24/15 0019 12/24/15 0205 12/24/15 0406 12/24/15 0651  GLUCAP 162* 213* 215* 213* 240*    Recent Results (from the past 240 hour(s))  Blood Culture (routine x 2)     Status: None (Preliminary result)   Collection Time: 12/21/15  9:33 PM  Result Value Ref Range Status   Specimen Description BLOOD RIGHT [facility]  Final   Special Requests BOTTLES DRAWN AEROBIC AND ANAEROBIC 5CC  Final   Culture   Final    NO GROWTH 1 DAY Performed at [facility]    Report Status PENDING  Incomplete  Blood Culture (routine x 2)     Status: None (Preliminary result)   Collection Time: 12/21/15 10:17 PM  Result Value Ref Range Status   Specimen Description BLOOD BLOOD LEFT FOREARM  Final   Special Requests BOTTLES DRAWN AEROBIC AND ANAEROBIC 5ML EA  Final   Culture   Final    NO GROWTH 1 DAY Performed  at [facility]    Report Status PENDING  Incomplete  Urine culture     Status: None   Collection Time: 12/22/15 12:20 AM  Result Value Ref Range Status   Specimen Description URINE, CLEAN CATCH  Final   Special Requests NONE  Final   Culture   Final    NO GROWTH 1 DAY Performed at [facility]    Report Status 12/23/2015 FINAL  Final  MRSA PCR Screening     Status: None   Collection Time: 12/22/15  4:22 AM  Result Value Ref Range Status   MRSA by PCR NEGATIVE NEGATIVE Final    Comment:        The GeneXpert MRSA Assay (FDA approved for NASAL specimens only), is one component of a comprehensive MRSA colonization surveillance program. It is not intended to diagnose MRSA infection nor to guide or monitor treatment for MRSA infections.   Culture, sputum-assessment     Status: None   Collection Time: 12/23/15  6:49 AM  Result Value Ref Range Status   Specimen Description SPUTUM  Final   Special  Requests NONE  Final   Sputum evaluation   Final    THIS SPECIMEN IS ACCEPTABLE. RESPIRATORY CULTURE REPORT TO FOLLOW.   Report Status 12/23/2015 FINAL  Final         Studies: No results found.      Scheduled Meds: . ceFEPime (MAXIPIME) IV  1 g Intravenous 3 times per day  . docusate sodium  250 mg Oral Daily  . insulin regular human CONCENTRATED  25-85 Units Subcutaneous QAC lunch  . insulin regular human CONCENTRATED  25-85 Units Subcutaneous QAC supper  . insulin regular human CONCENTRATED  35-105 Units Subcutaneous QAC breakfast  . pantoprazole  40 mg Oral Daily  . pregabalin  100 mg Oral TID  . simvastatin  40 mg Oral QHS   Continuous Infusions: . 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 [facility] Pager [phone]  If 7PM-7AM, please contact night-coverage [email or web address] Password [email or web address] 12/24/2015, 7:02 AM    LOS: 2 days

## 2015-12-24 NOTE — Progress Notes (Signed)
Patient ID: [name], male   DOB: 26-Oct-1950, 65 y.o.   MRN: [id number]    Referring Physician(s): Dr. Irine Seal  Supervising Physician: Jacqulynn Cadet  Chief Complaint: Right nephrolithiasis  Subjective: Pt nauseated and anxious about procedure.  Left sided PCN pulled out by Dr. Jeffie Pollock yesterday secondary to pain.  Allergies: Symbicort; Advair diskus; Hydromorphone; Morphine; and Soap  Medications: Prior to Admission medications   Medication Sig Start Date End Date Taking? Authorizing Provider  aspirin EC 81 MG tablet Take 81 mg by mouth every morning. Reported on 12/05/2015 12/03/11  Yes Sherren Mocha, MD  DEXILANT 30 MG capsule TAKE 1 CAPSULE(30 MG) BY MOUTH DAILY 08/21/15  Yes Wendie Agreste, MD  docusate sodium (COLACE) 250 MG capsule Take 250 mg by mouth daily.   Yes Historical Provider, MD  furosemide (LASIX) 40 MG tablet TAKE 1 TABLET BY MOUTH EVERY DAY 10/16/15  Yes Sherren Mocha, MD  HUMULIN R U-500 KWIKPEN 500 UNIT/ML kwikpen Inject 25-105 Units into the skin 3 (three) times daily. Sliding scale instructions per [facility]: "65+5 units at breakfast, 45+5 units at lunch and 45+5 units at supper." This means: CBG <70 = 0 units CBG 70-90 = 35 units if breakfast, 25 units if lunch or dinner. CBG 91-130 = base amount(65 units if breakfast, 45 units if lunch or dinner) CBG 131-150 = base amount + 5 units CBG 151-200 = + 5 more units CBG 201-250 = + 5 more units CBG 251-300 = + 5 more units CBG 301-350 = + 5 more units CBG 351-400 = + 5 more units CBG 401-450 = + 5 more units CBG >450 = + 5 more units 11/14/15  Yes Historical Provider, MD  LORazepam (ATIVAN) 0.5 MG tablet Take 1 tablet at bedtime prn anxiety. Patient taking differently: Take 0.5-1 mg by mouth at bedtime as needed for anxiety.  11/29/14  Yes Barton Fanny, MD  oxyCODONE (ROXICODONE) 15 MG immediate release tablet Limit 1 tablet by mouth 3-5 times per day if tolerated 12/11/15  Yes  Mohammed Kindle, MD  oxyCODONE (ROXICODONE) 30 MG immediate release tablet Take 1 tablet (30 mg total) by mouth every 4 (four) hours as needed for severe pain. 12/21/15  Yes Cleon Gustin, MD  oxyCODONE (ROXICODONE) 5 MG immediate release tablet Limit 1 tab by mouth 3-5 times per day if tolerated  NOTE OXYCODONE IS 5 MG TAB 12/11/15  Yes Mohammed Kindle, MD  pregabalin (LYRICA) 100 MG capsule Take 100 mg by mouth 3 (three) times daily.   Yes Historical Provider, MD  simvastatin (ZOCOR) 40 MG tablet Take 1 tablet (40 mg total) by mouth at bedtime. 07/28/12  Yes Barton Fanny, MD  ciprofloxacin (CILOXAN) 0.3 % ophthalmic solution Place 2 drops into both eyes every 4 (four) hours while awake. Administer 1 drop, every 2 hours, while awake, for 2 days. Then 1 drop, every 4 hours, while awake, for the next 5 days. Patient not taking: Reported on 12/20/2015 12/07/15   Nishant Dhungel, MD  DULoxetine (CYMBALTA) 60 MG capsule TAKE 1 CAPSULE(30 MG) BY MOUTH DAILY 12/23/15   Wendie Agreste, MD  miconazole (MICOTIN) 2 % powder Apply topically as needed for itching. For rash between legs , patient using 2 times daily    Historical Provider, MD  nystatin (MYCOSTATIN) powder Apply topically 3 (three) times daily. Patient taking differently: Apply 1 g topically daily as needed (FOR RASH).  10/31/15   Wendie Agreste, MD  ondansetron [facility]) 4  MG tablet Take 1-2 tablets (4-8 mg total) by mouth every 8 (eight) hours as needed. Patient taking differently: Take 4-8 mg by mouth every 8 (eight) hours as needed for nausea or vomiting.  05/29/15   Wendie Agreste, MD  promethazine (PHENERGAN) 25 MG tablet Take 1 tablet (25 mg total) by mouth every 8 (eight) hours as needed for nausea or vomiting. Patient taking differently: Take 25 mg by mouth every 8 (eight) hours as needed for nausea or vomiting (does not work uses Zofran for nausea).  06/04/15   Wendie Agreste, MD  traZODone (DESYREL) 100 MG tablet TAKE 1 TABLET BY  MOUTH AT BEDTIME 12/23/15   Wendie Agreste, MD  zolpidem (AMBIEN) 10 MG tablet Take 1 tablet (10 mg total) by mouth at bedtime as needed for sleep. 04/20/13   Barton Fanny, MD    Vital Signs: BP 163/62 mmHg  Pulse 83  Temp(Src) 95.5 F (35.3 C) (Oral)  Resp 16  Ht 6\' 1"  (1.854 m)  Wt 270 lb 1 oz (122.5 kg)  BMI 35.64 kg/m2  SpO2 94%  Physical Exam: Skin: left sided PCN site is clean and covered with gauze and tape.  No leakage currently Heart: regular Lungs: CTAB Abd: soft, obese, ND  Imaging: Dg Abd 1 View  12/20/2015  CLINICAL DATA:  Left renal stone removal and stent placement EXAM: ABDOMEN - 1 VIEW COMPARISON:  12/19/2015.  11/05/2015. FINDINGS: C-arm images show a left nephrostomy tube in place. Wire in catheter are in place extending from this approach into the bladder. Contrast injection shows opacification of the renal collecting system. Limited resolution. IMPRESSION: Intraprocedural films for left stone removal and stent placement. Electronically Signed   By: Nelson Chimes M.D.   On: 12/20/2015 12:37   Dg Chest Port 1 View  12/21/2015  CLINICAL DATA:  Sepsis.  Fever and tachycardia. EXAM: PORTABLE CHEST 1 VIEW COMPARISON:  12/07/2015 FINDINGS: Chronic right chest wall deformities and pleural thickening due to remote rib fractures. Mild chronic interstitial coarsening. No confluent alveolar opacity. No large effusions. Unchanged cardiomegaly and aortic tortuosity. IMPRESSION: No acute cardiopulmonary findings. Electronically Signed   By: Andreas Newport M.D.   On: 12/21/2015 21:35   Dg C-arm 1-60 Min-no Report  12/20/2015  CLINICAL DATA: left perc C-ARM 1-60 MINUTES Fluoroscopy was utilized by the requesting physician.  No radiographic interpretation.   Ct Renal Stone Study  12/22/2015  CLINICAL DATA:  Fever and confusion one day after percutaneous nephrolithotomy. EXAM: CT ABDOMEN AND PELVIS WITHOUT CONTRAST TECHNIQUE: Multidetector CT imaging of the abdomen and pelvis was  performed following the standard protocol without IV contrast. COMPARISON:  11/05/2015 FINDINGS: There is a percutaneous nephrostomy on the left, extending into the left renal pelvis. There is a left ureteral stent with proximal end in the left renal pelvis and distal end in the urinary bladder. No calculi are evident within the ureter. There is near complete removal of calculi from the left renal collecting system. There are 2 tiny residual calculus fragments in a left lower pole calyx, measuring up to 3 mm. There is a 3 x 4 mm calculus in a posterior left midpole calyx. There is mild stranding around the left renal pelvis and left ureter but no urinoma or other abnormal fluid collection. No perinephric hematoma. The right collecting system contains numerous calculi measuring up to 2 cm. This is unchanged from 11/05/2015. There are unremarkable unenhanced appearances of the liver, spleen, pancreas, biliary system and adrenals. The abdominal  aorta is normal in caliber. There is mild atherosclerotic calcification. There is no adenopathy in the abdomen or pelvis. There is a large hiatal hernia.  Bowel is otherwise unremarkable. There is new patchy airspace opacity in the right lower lobe. IMPRESSION: 1. Left percutaneous nephrostomy catheter and left ureteral stent appear unchanged in position from the intra procedural images of 12/20/2015. Near complete removal of calculi from the left renal collecting system. No hematoma, urinoma or other immediate complication is evident. No ureteral calculus. 2. Unchanged right nephrolithiasis. 3. New patchy airspace opacity in the right lower lobe posteriorly may represent pneumonia or aspiration. 4. Large hiatal hernia. Electronically Signed   By: Andreas Newport M.D.   On: 12/22/2015 01:14    Labs:  CBC:  Recent Labs  12/20/15 1216 12/21/15 0446 12/21/15 2131 12/23/15 0343  WBC 11.4* 10.5 13.2* 7.3  HGB 11.7* 10.8* 12.6* 10.2*  HCT 34.9* 32.6* 37.8* 29.3*  PLT  371 341 392 324    COAGS:  Recent Labs  10/15/15 0849 12/05/15 2241 12/19/15 0745 12/22/15 0330  INR 1.01 1.20 1.09 1.20  APTT 29 31 28  41*    BMP:  Recent Labs  12/20/15 1216 12/21/15 0446 12/21/15 2131 12/23/15 0343  NA 136 140 136 136  K 4.0 4.6 3.9 3.8  CL 102 104 100* 105  CO2 25 29 24 23   GLUCOSE 266* 224* 168* 163*  BUN 11 10 12 7   CALCIUM 9.1 9.5 9.8 9.3  CREATININE 0.98 0.94 1.00 0.72  GFRNONAA >60 >60 >60 >60  GFRAA >60 >60 >60 >60    LIVER FUNCTION TESTS:  Recent Labs  04/30/15 1812 05/05/15 1154 11/05/15 1110 12/21/15 2131  BILITOT 0.7 0.4 0.8 1.5*  AST 13 27 19 19   ALT 9 12* 17 16*  ALKPHOS 148* 137* 142* 133*  PROT 6.5 6.6 6.7 7.2  ALBUMIN 3.8 3.8 3.8 3.8    Assessment and Plan: 1. Right nephrolithiasis -plan to proceed with placement today -has been NPO and off blood thinners 2. Left sided PCN -removed yesterday by Dr. Jeffie Pollock.  Electronically Signed: Henreitta Cea 12/24/2015, 9:29 AM   I spent a total of 25 Minutes at the the patient's bedside AND on the patient's hospital floor or unit, greater than 50% of which was counseling/coordinating care for right nephrolithiasis, s/p left PCN placement

## 2015-12-24 NOTE — Anesthesia Procedure Notes (Signed)
Procedure Name: Intubation Date/Time: 12/24/2015 12:40 PM Performed by: [name] Fu Pre-anesthesia Checklist: Patient identified, Emergency Drugs available, Suction available and Patient being monitored Patient Re-evaluated:Patient Re-evaluated prior to inductionOxygen Delivery Method: Circle System Utilized Preoxygenation: Pre-oxygenation with 100% oxygen Intubation Type: IV induction, Rapid sequence and Cricoid Pressure applied Ventilation: Unable to mask ventilate Laryngoscope Size: Mac and 3 Grade View: Grade I Tube type: Oral Tube size: 7.5 mm Number of attempts: 1 Airway Equipment and Method: Stylet Placement Confirmation: ETT inserted through vocal cords under direct vision,  positive ETCO2 and breath sounds checked- equal and bilateral Secured at: 22 cm Tube secured with: Tape Dental Injury: Teeth and Oropharynx as per pre-operative assessment

## 2015-12-24 NOTE — Sedation Documentation (Signed)
Patient denies pain and is resting comfortably.  

## 2015-12-24 NOTE — Op Note (Signed)
Preoperative diagnosis: Right renal stone  Postoperative diagnosis: Same  Procedure 1.  Right percutaneous nephrostolithotomy for stone greater than 2 cm 2.  Right nephrostogram 3.  Intraoperative fluoroscopy, under 1 hour, with interpretation 4.  Placement of a 6 x 26 double-J ureteral stent. 5.  Placement of a 82 French nephrostomy tube 6.  Dilation of percutaneous tract  Attending: [name], MD  Anesthesia: General  Estimated blood loss: Minimal  Antibiotics: Zosyn  Drains: 1.  16 French Foley catheter 2.  6 x 26 right double-J ureteral stent 3.  18 French nephrostomy tube  Specimens: Stone for analysis  Findings: numerous renal pelvis calculi. 2cm UPJ calculus. Limited drainage of contrast down the ureter prior to removing UPJ calculus. Minimal extravasation following PCNL  Indications: Patient is a 65 year old male with a history of large right renal stones.  After discussing treatment options and decided she was right percutaneous nephrostolithotomy.  Patient already has a nephrostomy tube.  Procedure in detail: Prior to procedure consent was obtained.  Patient was brought to the operating room debridement was done to ensure correct patient, correct procedure, and correct site.  General anesthesia was administered.  A 16 French Foley catheter was in place.  The patient was then placed in the prone position.  His nephrostomy tube and right flank was then prepped and draped in usual sterile fashion.  A nephrostogram was obtained and findings noted above.  Through the nephrostomy tube we then placed a sensor wire.  Sensor wire was coiled in the renal pelvis we then removed the nephrostomy tube.  We then made an incision at the level of the skin and over the wire we then placed a NephroMax dilator.  We dilated the nephrostomy tract to 30 [place] and held this 18 cm of water for 1 minute.  We then  placed the access sheath over the balloon.  The balloon was then deflated.  We  then used a rigid nephroscope to perform nephroscopy.  We encountered numerous large renal pelvis calculi and large UPJ calculus.  We then used a lithoclast to fragment the stone in multiple pieces.  Using the graspers we removed stone fragments and sent for composition analysis.  Once the majority of the stone was removed we then were able to perform nephroscopy with a flexible nephroscope.   We then placed a second wire through the ureteroscope into the bladder.  We then removed the nephroscope and over the wire placed a 6 x 26 double-J ureteral stent.  The wire was then removed and good coil was noted in the renal pelvis under direct vision in the bladder under fluoroscopy.  We then placed a 20 French nephrostomy tube through the sheath into the renal pelvis.  The balloon was inflated with 3 amounts of contrast.  We then removed the access sheath in and obtain another nephrostogram.  We noted minimal extravasation of contrast.  We then secured the nephrostomy tubes with 0 silks in interrupted fashion.  Dressing was placed over the nephrostomy tube site and this then concluded the procedure was well-tolerated by the patient.  Complications: None  Condition: Stable, extubated, transferred to PACU  Plan: Patient is to be admitted overnight for observation.  His Foley catheter through the morning. Nephrostomy tube will be removed in 2 days. She is then to be discharged home and followup in 1 week for stent removal

## 2015-12-24 NOTE — Progress Notes (Signed)
PT Cancellation Note  Patient Details Name: [name] MRN: [id number] DOB: Mar 15, 1951   Cancelled Treatment:    Reason Eval/Treat Not Completed: Patient at procedure or test/unavailable   Marcelino Freestone PT [id number]  12/24/2015, 11:29 AM

## 2015-12-24 NOTE — Progress Notes (Signed)
Pt's wife insisting pt receive 75 units of Humulin  U 500 per pen as he would at home, explained that it is ordered [facility] not [facility].  Wife states "get an order and give it or I will take it out of your hand and give it." She is upset because he did not receive three doses today.  He did not due to being in surgery.  There was no calming her.  I explained I understood and would call for an order since his glucose was 360.  She states "that's okay, I will sue, I'm feeling lucky."  Explained to pt's wife that she needed to calm down and let me take care of the situation.  NP called, order obtained, insulin given. Wife verbalized her appreciation. Fara Olden P

## 2015-12-24 NOTE — Transfer of Care (Signed)
Immediate Anesthesia Transfer of Care Note  Patient: [name]  Procedure(s) Performed: Procedure(s): RIGHT  PERCUTANEOUS NEPHROLITHOTOMY right double j stent (Right)  Patient Location: PACU  Anesthesia Type:General  Level of Consciousness:  sedated, patient cooperative and responds to stimulation  Airway & Oxygen Therapy:Patient Spontanous Breathing and Patient connected to face mask oxgen  Post-op Assessment:  Report given to PACU RN and Post -op Vital signs reviewed and stable  Post vital signs:  Reviewed and stable  Last Vitals:  Filed Vitals:   12/24/15 1034 12/24/15 1043  BP: 177/88 172/95  Pulse: 87 97  Temp:    Resp: 16 14    Complications: No apparent anesthesia complications

## 2015-12-24 NOTE — Progress Notes (Signed)
Received from PACU, awake, R nephrostomy tube and foley catheter  draining cherry colored output. Wife at bedside.

## 2015-12-24 NOTE — Progress Notes (Signed)
OT Cancellation Note  Patient Details Name: [name] MRN: [id number] DOB: 12/06/1950   Cancelled Treatment:    Reason Eval/Treat Not Completed: Patient at procedure or test/ unavailable -- patient off unit in OR for procedure. Will follow up for OT evaluation tomorrow.  Dawne Casali A 12/24/2015, 9:39 AM

## 2015-12-24 NOTE — Interval H&P Note (Signed)
History and Physical Interval Note:  12/24/2015 11:33 AM  [name]  has presented today for surgery, with the diagnosis of BILATERAL RENAL CALCULI  The various methods of treatment have been discussed with the patient and family. After consideration of risks, benefits and other options for treatment, the patient has consented to  Procedure(s): RIGHT  PERCUTANEOUS NEPHROLITHOTOMY  (Right) CYSTOSCOPY WITH LEFT STENT REMOVAL (N/A) WITH HOLMIUM LASER LITHOTRIPSY (N/A) as a surgical intervention .  The patient's history has been reviewed, patient examined, no change in status, stable for surgery.  I have reviewed the patient's chart and labs.  Questions were answered to the patient's satisfaction.     [name]

## 2015-12-24 NOTE — Progress Notes (Signed)
Inpatient Diabetes Program Recommendations  AACE/ADA: New Consensus Statement on Inpatient Glycemic Control (2015)  Target Ranges:  Prepandial:   less than 140 mg/dL      Peak postprandial:   less than 180 mg/dL (1-2 hours)      Critically ill patients:  140 - 180 mg/dL   Results for [name], [name] (MRN [id number]) as of 12/24/2015 13:04  Ref. Range 12/23/2015 08:00 12/23/2015 12:11 12/23/2015 16:45 12/23/2015 17:03 12/23/2015 17:21 12/23/2015 17:49 12/23/2015 20:53  Glucose-Capillary Latest Ref Range: 65-99 mg/dL 170 (H) 202 (H) 60 (L) 57 (L) 66 113 (H) 162 (H)   Results for [name], [name] (MRN [id number]) as of 12/24/2015 13:04  Ref. Range 12/24/2015 00:19 12/24/2015 02:05 12/24/2015 04:06 12/24/2015 06:51 12/24/2015 07:42 12/24/2015 12:13  Glucose-Capillary Latest Ref Range: 65-99 mg/dL 213 (H) 215 (H) 213 (H) 240 (H) 249 (H) 225 (H)     Admit with: Sepsis/ Pyelonephritis  History: DM2  Home DM Meds: U-500 Concentrated Insulin:  35-105 units with Breakfast/ 25-85 units with Lunch/ 25-85 units with Dinner (per specific SSI)  Current Orders: U-500 Concentrated Insulin:  35-105 units with Breakfast/ 25-85 units with Lunch/ 25-85 units with Dinner (per specific SSI)      -Note patient had Asymptomatic Hypoglycemic event yesterday at 4pm after receiving 60 units U-500 concentrated insulin at 12pm.  -Unsure of exact PO intake of meals.  -Patient NPO today for Cystoscopy with Perc Nephrolithotomy by Urology today.  Patient received partial dose of U-500 insulin this AM (15 units).  -Attempted to speak with patient and wife today regarding pt's home insulin doses, however, patient currently down in PACU post Urology Procedure and wife not available in pt's room.    MD- Will attempt to speak with pt and his wife tomorrow (04/11) first thing in the AM.    Please continue patient's current doses of U-500 insulin (using home scale).  Would restart U-500 insulin with supper time dose  tonight.     --Will follow patient during hospitalization--  Wyn Quaker RN, MSN, CDE Diabetes Coordinator Inpatient Glycemic Control Team Team Pager: [phone] (8a-5p)

## 2015-12-25 ENCOUNTER — Encounter (HOSPITAL_COMMUNITY): Payer: Self-pay | Admitting: Urology

## 2015-12-25 ENCOUNTER — Other Ambulatory Visit: Payer: Self-pay | Admitting: Pain Medicine

## 2015-12-25 LAB — GLUCOSE, CAPILLARY
GLUCOSE-CAPILLARY: 172 mg/dL — AB (ref 65–99)
GLUCOSE-CAPILLARY: 161 mg/dL — AB (ref 65–99)
GLUCOSE-CAPILLARY: 126 mg/dL — AB (ref 65–99)
Glucose-Capillary: 256 mg/dL — ABNORMAL HIGH (ref 65–99)
Glucose-Capillary: 321 mg/dL — ABNORMAL HIGH (ref 65–99)
Glucose-Capillary: 41 mg/dL — CL (ref 65–99)
Glucose-Capillary: 79 mg/dL (ref 65–99)
Glucose-Capillary: 80 mg/dL (ref 65–99)

## 2015-12-25 MED ORDER — DEXTROSE 50 % IV SOLN
25.0000 mL | Freq: Once | INTRAVENOUS | Status: AC
  Administered 2015-12-25: 25 mL via INTRAVENOUS

## 2015-12-25 MED ORDER — DEXTROSE 5 % IV SOLN
2.0000 g | Freq: Three times a day (TID) | INTRAVENOUS | Status: DC
  Administered 2015-12-25 – 2015-12-26 (×5): 2 g via INTRAVENOUS
  Filled 2015-12-25 (×5): qty 2

## 2015-12-25 NOTE — Care Management Important Message (Signed)
Important Message  Patient Details IM Letter given to Cookie/Case Manager to present to Patient Name: AKEEM GETTER MRN: [id number] Date of Birth: 09-21-1950   Medicare Important Message Given:  Yes    Camillo Flaming 12/25/2015, 10:27 AMImportant Message  Patient Details  Name: JULIENNE BERNSON MRN: [id number] Date of Birth: [birth year]   Medicare Important Message Given:  Yes    Camillo Flaming 12/25/2015, 10:26 AM

## 2015-12-25 NOTE — Progress Notes (Signed)
1 Day Post-Op Subjective: Patient reports good pain control. No nausea/vomiting  Objective: Vital signs in last 24 hours: Temp:  [98 F (36.7 C)-99 F (37.2 C)] 98 F (36.7 C) (04/11 0451) Pulse Rate:  [77-103] 77 (04/11 0451) Resp:  [12-22] 18 (04/11 0451) BP: (142-177)/(60-98) 142/65 mmHg (04/11 0451) SpO2:  [98 %-100 %] 99 % (04/11 0451)  Intake/Output from previous day: 04/10 0701 - 04/11 0700 In: 2350 [I.V.:2300; IV Piggyback:50] Out: 5220 [Urine:4920; Blood:200] Intake/Output this shift: Total I/O In: -  Out: 100 [Urine:100]  Physical Exam:  General:alert, cooperative and appears stated age GI: soft, non tender, normal bowel sounds, no palpable masses, no organomegaly, no inguinal hernia Male genitalia: not done Extremities: R BKA  Lab Results:  Recent Labs  12/23/15 0343  HGB 10.2*  HCT 29.3*   BMET  Recent Labs  12/23/15 0343 12/24/15 1553  NA 136 137  K 3.8 4.3  CL 105 106  CO2 23 21*  GLUCOSE 163* 314*  BUN 7 9  CREATININE 0.72 0.83  CALCIUM 9.3 9.4   No results for input(s): LABPT, INR in the last 72 hours. No results for input(s): LABURIN in the last 72 hours. Results for orders placed or performed during the hospital encounter of 12/21/15  Blood Culture (routine x 2)     Status: None (Preliminary result)   Collection Time: 12/21/15  9:33 PM  Result Value Ref Range Status   Specimen Description BLOOD RIGHT [facility]  Final   Special Requests BOTTLES DRAWN AEROBIC AND ANAEROBIC 5CC  Final   Culture   Final    NO GROWTH 2 DAYS Performed at [facility]    Report Status PENDING  Incomplete  Blood Culture (routine x 2)     Status: None (Preliminary result)   Collection Time: 12/21/15 10:17 PM  Result Value Ref Range Status   Specimen Description BLOOD BLOOD LEFT FOREARM  Final   Special Requests BOTTLES DRAWN AEROBIC AND ANAEROBIC 5ML EA  Final   Culture   Final    NO GROWTH 2 DAYS Performed at [facility]    Report Status  PENDING  Incomplete  Urine culture     Status: None   Collection Time: 12/22/15 12:20 AM  Result Value Ref Range Status   Specimen Description URINE, CLEAN CATCH  Final   Special Requests NONE  Final   Culture   Final    NO GROWTH 1 DAY Performed at [facility]    Report Status 12/23/2015 FINAL  Final  MRSA PCR Screening     Status: None   Collection Time: 12/22/15  4:22 AM  Result Value Ref Range Status   MRSA by PCR NEGATIVE NEGATIVE Final    Comment:        The GeneXpert MRSA Assay (FDA approved for NASAL specimens only), is one component of a comprehensive MRSA colonization surveillance program. It is not intended to diagnose MRSA infection nor to guide or monitor treatment for MRSA infections.   Respiratory virus panel     Status: None   Collection Time: 12/22/15  4:23 AM  Result Value Ref Range Status   Respiratory Syncytial Virus A Negative Negative Final   Respiratory Syncytial Virus B Negative Negative Final   Influenza A Negative Negative Final   Influenza B Negative Negative Final   Parainfluenza 1 Negative Negative Final   Parainfluenza 2 Negative Negative Final   Parainfluenza 3 Negative Negative Final   Metapneumovirus Negative Negative Final  Rhinovirus Negative Negative Final   Adenovirus Negative Negative Final    Comment: (NOTE) Performed At: [facility] [place], [place] [id number] Lindon Romp MD [id number]   Culture, sputum-assessment     Status: None   Collection Time: 12/23/15  6:49 AM  Result Value Ref Range Status   Specimen Description SPUTUM  Final   Special Requests NONE  Final   Sputum evaluation   Final    THIS SPECIMEN IS ACCEPTABLE. RESPIRATORY CULTURE REPORT TO FOLLOW.   Report Status 12/23/2015 FINAL  Final  Culture, respiratory (NON-Expectorated)     Status: None (Preliminary result)   Collection Time: 12/23/15  6:49 AM  Result Value Ref Range Status   Specimen Description SPUTUM  Final    Special Requests NONE  Final   Gram Stain   Final    NO WBC SEEN NO SQUAMOUS EPITHELIAL CELLS SEEN RARE GRAM NEGATIVE RODS Performed at [facility]    Culture   Final    MODERATE PSEUDOMONAS AERUGINOSA Performed at [facility]    Report Status PENDING  Incomplete    Studies/Results: Dg Abd 1 View  12/24/2015  CLINICAL DATA:  Right percutaneous nephrostomy and stent placement. EXAM: ABDOMEN - 1 VIEW; DG C-ARM 1-60 MIN-NO REPORT COMPARISON:  Previous examinations, including earlier today. FINDINGS: Three C-arm views of the right abdomen and pelvis demonstrate placement of a ureteral stent through a right percutaneous nephrostomy. The distal portion of the stent is coiled in the urinary bladder. A left ureteral stent is also in place. IMPRESSION: Right ureteral stent placement. Electronically Signed   By: Claudie Revering M.D.   On: 12/24/2015 14:56   Dg C-arm 1-60 Min-no Report  12/24/2015  CLINICAL DATA: stones C-ARM 1-60 MINUTES Fluoroscopy was utilized by the requesting physician.  No radiographic interpretation.   Ir  Nephroureteral Cath Place Right  12/24/2015  INDICATION: 65 year old male with a history of bilateral nephrolithiasis. He underwent left-sided percutaneous nephrolithotomy last week and was readmitted for fever and disorientation. He has un treated extensive right-sided nephrolithiasis and requires percutaneous nephro ureteral access in anticipation of right PCNL later today. EXAM: IR NEPHROURETERAL CATH PLACEMENT RIGHT COMPARISON:  CT scan 12/22/2015 MEDICATIONS: Patient is on scheduled Maxipime every 8 hours. No additional antibiotic prophylaxis was administered. ANESTHESIA/SEDATION: Fentanyl 100 mcg IV; Versed 6 mg IV Moderate Sedation Time:  33 The patient was continuously monitored during the procedure by the interventional radiology nurse under my direct supervision. CONTRAST:  66mL ISOVUE-300 IOPAMIDOL (ISOVUE-300) INJECTION 61% - administered into the  collecting system(s) FLUOROSCOPY TIME:  Fluoroscopy Time: 6 minutes 48 seconds (678 mGy). COMPLICATIONS: None immediate. Estimated blood loss:  0 PROCEDURE: Informed written consent was obtained from the patient after a thorough discussion of the procedural risks, benefits and alternatives. All questions were addressed. Maximal Sterile Barrier Technique was utilized including caps, mask, sterile gowns, sterile gloves, sterile drape, hand hygiene and skin antiseptic. A timeout was performed prior to the initiation of the procedure. The left flank was imaged fluoroscopically. The stone burden appears to have shifted compared to the recent prior CT images. The stones previously noted in a posterior upper pole calyx appear to have settled more inferiorly. As such, the central stones in the renal pelvis were initially targeted. Under fluoroscopic guidance, a 21 gauge Chiba needle was carefully advanced into the renal pelvis. Aspiration yields clear urine confirming needle placement. Contrast was then injected through connecting tubing to opacifying the renal pelvis. Several anterior calices were  also opacified. The posterior upper pole calices are not well seen. A small volume of air was then injected into the kidney. The air successfully filled the posterior upper pole calices. A suitable upper pole calyx was selected and the overlying skin marked. Local anesthesia was attained by infiltration with 1% lidocaine. Again, under fluoroscopic guidance, a second 21 gauge Chiba needle was carefully advanced into the posterior upper pole calyx. Once the needle tip appear to be within the calyx, aspiration yielded free air confirming. A small amount of contrast was injected opacifying the renal collecting system. A wire was then advanced into the renal pelvis. The needle was exchanged for the Accustick sheath. A small amount of contrast was injected through the Accustick sheath further confirming location within the renal  pelvis. An Amplatz wire was then carefully advanced past the UPJ stone and into the ureter. The Accustick sheath was then exchanged for a 5 French angled catheter. The angled catheter was advanced over the wire and into the urinary bladder. The wire was removed. The catheter was secured to the skin with 0 silk suture and capped. A sterile bandage was applied. The patient tolerated the procedure well. IMPRESSION: 1. Shifting stone burden. The stones previously noted in the posterior upper pole calyx appear to have settled more inferiorly. 2. Successful placement of a nephro ureteral catheter via a posterior upper pole calyx which should allow access to the majority of the stone burden. Signed, Criselda Peaches, MD [facility] Radiology Electronically Signed   By: Jacqulynn Cadet M.D.   On: 12/24/2015 11:17    Assessment/Plan: Nephrolithiasis s/p R PCNL  Plan: 1. Foley discontinued today 2. Likely discharge home tomorrow pending cultures   LOS: 3 days   [name] 12/25/2015, 10:04 AM

## 2015-12-25 NOTE — Progress Notes (Addendum)
Inpatient Diabetes Program Recommendations  AACE/ADA: New Consensus Statement on Inpatient Glycemic Control (2015)  Target Ranges:  Prepandial:   less than 140 mg/dL      Peak postprandial:   less than 180 mg/dL (1-2 hours)      Critically ill patients:  140 - 180 mg/dL   Results for [name], [name] (MRN [id number]) as of 12/25/2015 07:54  Ref. Range 12/24/2015 00:19 12/24/2015 02:05 12/24/2015 04:06 12/24/2015 06:51 12/24/2015 07:42 12/24/2015 12:13 12/24/2015 14:56 12/24/2015 16:32 12/24/2015 21:01  Glucose-Capillary Latest Ref Range: 65-99 mg/dL 213 (H) 215 (H) 213 (H) 240 (H) 249 (H) 225 (H) 279 (H) 247 (H) 368 (H)   Results for [name], [name] (MRN [id number]) as of 12/25/2015 07:54  Ref. Range 12/25/2015 00:09 12/25/2015 04:31  Glucose-Capillary Latest Ref Range: 65-99 mg/dL 321 (H) 256 (H)    Admit with: Sepsis/ Pyelonephritis  History: DM2  Home DM Meds: U-500 Concentrated Insulin: 35-105 units with Breakfast/ 25-85 units with Lunch/ 25-85 units with Dinner (per specific SSI)  Current Orders: U-500 Concentrated Insulin: 35-105 units with Breakfast/ 25-85 units with Lunch/ 25-85 units with Dinner (per specific SSI)     -Note patient had Asymptomatic Hypoglycemic event 04/09 at 4pm after receiving 60 units U-500 concentrated insulin at 12pm.  -Patient NPO yesterday (04/10) for Cystoscopy with Perc Nephrolithotomy by Urology. Patient received partial dose of U-500 insulin yesterday AM (15 units).  Was NPO most of the day and finally started back on diet at suppertime.  Resumed home regimen of U-500 insulin with supper last PM.  -Note patient's wife became extremely upset last PM b/c patient did not get all three full doses of U-500 insulin yesterday.  Patient was NPO during the day and all medications were on hold due to his procedure.  RN did give patient his U-500 insulin with his supper and wife demanded at bedtime that patient get another dose of U-500 insulin.  RN explained to wife that  the U-500 insulin was only ordered to be given with meals and not at bedtime.  Wife stated she would take the insulin pen from the nurse and give the patient the insulin herself if the RN did not call the MD and get an order to give the U-500 insulin at bedtime.  Per [facility] documentation, patient did get another dose of U-500 insulin at bedtime last night.  -Of note, patient's CBGs were also likely elevated to the 300 mg/dl range last PM b/c patient did receive 10 mg IV Decadron yesterday at approximately 4pm during the peri-op period.     MD- Please continue current doses of U-500 insulin per patient's home regimen   1150am Addendum: Spoke with pt and wife about patient's home regimen of U-500 insulin.  Patient's wife stated to me "it was like an act of Congress to get my husband's sliding scale transcribed in the chart and started in the hospital".  Discussed with patient and his wife that b/c pt was NPO for a prolonged period that the MDs did not want patient to get too much insulin and experience Hypoglycemia during his procedure.  Also explained to wife and patient that patient received IV Decadron in the OR yesterday and that also contributed to patient's elevated CBGs yesterday evening.  Wife of patient wanted to know if we can lift the carbohydrate restrictions from patient's diet orders.  I reminded patient and wife that patient is on a carbohydrate modified diet in order to restrict the amount of carbohydrates that can  be ordered by patient.  Wife stated to me it wasn't a Carb Mod diet that he was getting but instead a "diabetic" diet.  I tried to explain that these two diets are one in the same.  Wife asked me if patient can have more liberal ordering options with breakfast as this is his biggest meal of the day.  Wife then went on to tell me she just wants pt to be able to get double portions of protein in the morning and that he was allowed to get double portion of eggs today.      --Will  follow patient during hospitalization--  Wyn Quaker RN, MSN, CDE Diabetes Coordinator Inpatient Glycemic Control Team Team Pager: [phone] (8a-5p)

## 2015-12-25 NOTE — Progress Notes (Signed)
PROGRESS NOTE    [name]  [id number]  DOB: 31-Dec-1950  DOA: 12/21/2015 PCP: Wendie Agreste, MD Outpatient Specialists: Pain Management: Dr. Primus Bravo in [facility] course: 65 y.o. male with PMH of hypertension, hyperlipidemia, IDDM on high doses of home insulin/U500, GERD, depression, PVD, kidney stone, CAD, kidney stones, s/p left PCNL by Dr. Alyson Ingles on 4/6, discharged home on 4/7, presented to the ED same afternoon with complaints of fever, confusion, lethargy, hematuria, dysuria & left flank pain. In ED, patient was found to have positive urinalysis with moderate amount of leukocytes, WBC 13.2, temperature 104.4, tachycardia, tachypnea, oxygen desaturated to 83%, electrolytes renal function okay, lactate 1.44. Chest x-ray showed new patchy infiltration in RLL. Admitted to stepdown unit for sepsis secondary to acute pyelonephritis and possible HCAP. Urology consulted. Improving. Transferred to medical bed 4/9. S/P right PCNL 4/10. Sputum culture: Pseudomonas-sensitivities pending. Possible DC home 4/12.  Assessment & Plan:   Postoperative Sepsis secondary to presumed acute pyelonephritis and Pseudomonas healthcare associated pneumonia (RLL) - Treated with IV fluids and started IV vancomycin and cefepime. - RSV panel: Negative and urine Legionella antigen: Negative. Flu panel PCR: Negative. Urinary pneumococcal antigen: Negative. Lactate normal. Pro-calcitonin 0.8. - Urine culture 1: Negative and blood cultures 2: Negative to date. - Chest x-ray was negative but pneumonia appeared on CT renal stone study. - MRSA PCR negative. DC vancomycin (upon chart review, received a dose of vancomycin on 4/8 AM.) - Improving. Continue current management. - Sputum culture: Moderate Pseudomonas. Await final sensitivities prior to transitioning to oral antibiotics.  Status post left PCNL 4/6/nephrolithiasis - Urology consultation and follow-up appreciated. - S/P removal left  nephrostomy tube on 4/9.  - S/P R PCNL 4/10. - Doing well. Foley discontinued 4/11.  Essential hypertension - Fluctuating and mildly uncontrolled. Continue hydralazine when necessary.  Chronic pain - Follows with pain management as outpatient. - Left PCNL removed 4/9. - Denies pain today.  HLD:  - Last LDL was 50 on 12/06/15  - Continue home medications: Zocor  Depression and anxiety: - Continue home medications: ativan   DM-II/IDDM:  - Last A1c 7.3 on 12/06/15, fairly controled.  - Patient is on high-dose insulin's at home i.e. U 500. Discussed at length with patient's spouse on 4/8. She stated that patient was on home regimen of these insulins during recent hospitalization by urology and when he had a TIA and was admitted at [facility].  - Discussed extensively with pharmacy on 4/8 and placed patient back on his home regimen of U5 100. Mildly uncontrolled CBGs. Monitor closely. - Hypoglycemic episode on 4/9 afternoon. - Diabetes coordinator consulted and will meet with patient and family on 4/11. - Received a dose of Decadron in OR yesterday and had clear liquids with sweet stuff last night and hence CBG 329 at midnight. Improved this morning with fasting blood sugar of 174. Continue current management. - Patient noncompliant with diet.   CAD:  - No CP - continue ASA and Zocor  GERD/large hiatal hernia: - Protonix   Confusion - Likely secondary to sepsis. Resolved.  Morbid obesity/Body mass index is 35.64 kg/(m^2).       DVT prophylaxis: SCDs Code Status: Full Family Communication: Discussed with patient's spouse & daughter at bedside on 4/11. Disposition Plan: Admitted to stepdown. Transfer to medical floor 4/9. DC home when medically stable-possibly 4/12.  Consultants:  Urology  Procedures:  Status post left PCNL 4/6-removed 4/19.  On 12/24/15: 1. Right percutaneous nephrostolithotomy for stone greater than  2 cm 2. Right nephrostogram 3. Intraoperative  fluoroscopy, under 1 hour, with interpretation 4. Placement of a 6 x 26 double-J ureteral stent. 5. Placement of a 50 French nephrostomy tube 6. Dilation of percutaneous tract  Antimicrobials:  Cefepime 4/7 >  IV vancomycin 1 dose.   Subjective: No pain reported. Denies complaints. Foley catheter removed earlier. Family at bedside.  Objective: Filed Vitals:   12/24/15 1615 12/24/15 1634 12/24/15 2110 12/25/15 0451  BP: 169/91 146/64 172/60 142/65  Pulse: 101 102 81 77  Temp: 99 F (37.2 C) 98.7 F (37.1 C) 98.6 F (37 C) 98 F (36.7 C)  TempSrc:  Oral Oral Oral  Resp: 14 16 16 18   Height:      Weight:      SpO2: 100% 100% 99% 99%    Intake/Output Summary (Last 24 hours) at 12/25/15 1100 Last data filed at 12/25/15 0843  Gross per 24 hour  Intake   2350 ml  Output   5120 ml  Net  -2770 ml   Filed Weights   12/22/15 0312 12/23/15 1643  Weight: 124.6 kg (274 lb 11.1 oz) 122.5 kg (270 lb 1 oz)    Exam:  General exam: Moderately built and morbidly obese male sitting up comfortably in bed. Respiratory system: clear to auscultation. No increased work of breathing. Cardiovascular system: S1 & S2 heard, RRR. No JVD, murmurs, gallops, clicks or pedal edema.  Gastrointestinal system: Abdomen is nondistended, soft and nontender. Normal bowel sounds heard.  Central nervous system: Alert and oriented. No focal neurological deficits. Extremities: Symmetric 5 x 5 power. Healed right BKA stump.   Data Reviewed: Basic Metabolic Panel:  Recent Labs Lab 12/20/15 1216 12/21/15 0446 12/21/15 2131 12/23/15 0343 12/24/15 1553  NA 136 140 136 136 137  K 4.0 4.6 3.9 3.8 4.3  CL 102 104 100* 105 106  CO2 25 29 24 23  21*  GLUCOSE 266* 224* 168* 163* 314*  BUN 11 10 12 7 9   CREATININE 0.98 0.94 1.00 0.72 0.83  CALCIUM 9.1 9.5 9.8 9.3 9.4   Liver Function Tests:  Recent Labs Lab 12/21/15 2131  AST 19  ALT 16*  ALKPHOS 133*  BILITOT 1.5*  PROT 7.2  ALBUMIN 3.8     No results for input(s): LIPASE, AMYLASE in the last 168 hours. No results for input(s): AMMONIA in the last 168 hours. CBC:  Recent Labs Lab 12/19/15 0745 12/20/15 1216 12/21/15 0446 12/21/15 2131 12/23/15 0343  WBC 10.2 11.4* 10.5 13.2* 7.3  NEUTROABS 6.7  --   --  11.0*  --   HGB 12.3* 11.7* 10.8* 12.6* 10.2*  HCT 37.1* 34.9* 32.6* 37.8* 29.3*  MCV 102.5* 102.6* 103.2* 102.2* 97.3  PLT 385 371 341 392 324   Cardiac Enzymes: No results for input(s): CKTOTAL, CKMB, CKMBINDEX, TROPONINI in the last 168 hours. BNP (last 3 results) No results for input(s): PROBNP in the last 8760 hours. CBG:  Recent Labs Lab 12/24/15 1632 12/24/15 2101 12/25/15 0009 12/25/15 0431 12/25/15 0745  GLUCAP 247* 368* 321* 256* 172*    Recent Results (from the past 240 hour(s))  Blood Culture (routine x 2)     Status: None (Preliminary result)   Collection Time: 12/21/15  9:33 PM  Result Value Ref Range Status   Specimen Description BLOOD RIGHT [facility]  Final   Special Requests BOTTLES DRAWN AEROBIC AND ANAEROBIC 5CC  Final   Culture   Final    NO GROWTH 2 DAYS Performed at [facility]    Report Status PENDING  Incomplete  Blood Culture (routine x 2)     Status: None (Preliminary result)   Collection Time: 12/21/15 10:17 PM  Result Value Ref Range Status   Specimen Description BLOOD BLOOD LEFT FOREARM  Final   Special Requests BOTTLES DRAWN AEROBIC AND ANAEROBIC 5ML EA  Final   Culture   Final    NO GROWTH 2 DAYS Performed at [facility]    Report Status PENDING  Incomplete  Urine culture     Status: None   Collection Time: 12/22/15 12:20 AM  Result Value Ref Range Status   Specimen Description URINE, CLEAN CATCH  Final   Special Requests NONE  Final   Culture   Final    NO GROWTH 1 DAY Performed at [facility]    Report Status 12/23/2015 FINAL  Final  MRSA PCR Screening     Status: None   Collection Time: 12/22/15  4:22 AM  Result Value Ref Range  Status   MRSA by PCR NEGATIVE NEGATIVE Final    Comment:        The GeneXpert MRSA Assay (FDA approved for NASAL specimens only), is one component of a comprehensive MRSA colonization surveillance program. It is not intended to diagnose MRSA infection nor to guide or monitor treatment for MRSA infections.   Respiratory virus panel     Status: None   Collection Time: 12/22/15  4:23 AM  Result Value Ref Range Status   Respiratory Syncytial Virus A Negative Negative Final   Respiratory Syncytial Virus B Negative Negative Final   Influenza A Negative Negative Final   Influenza B Negative Negative Final   Parainfluenza 1 Negative Negative Final   Parainfluenza 2 Negative Negative Final   Parainfluenza 3 Negative Negative Final   Metapneumovirus Negative Negative Final   Rhinovirus Negative Negative Final   Adenovirus Negative Negative Final    Comment: (NOTE) Performed At: [facility] [place], [place] [id number] Lindon Romp MD [id number]   Culture, sputum-assessment     Status: None   Collection Time: 12/23/15  6:49 AM  Result Value Ref Range Status   Specimen Description SPUTUM  Final   Special Requests NONE  Final   Sputum evaluation   Final    THIS SPECIMEN IS ACCEPTABLE. RESPIRATORY CULTURE REPORT TO FOLLOW.   Report Status 12/23/2015 FINAL  Final  Culture, respiratory (NON-Expectorated)     Status: None (Preliminary result)   Collection Time: 12/23/15  6:49 AM  Result Value Ref Range Status   Specimen Description SPUTUM  Final   Special Requests NONE  Final   Gram Stain   Final    NO WBC SEEN NO SQUAMOUS EPITHELIAL CELLS SEEN RARE GRAM NEGATIVE RODS Performed at [facility]    Culture   Final    MODERATE PSEUDOMONAS AERUGINOSA Performed at [facility]    Report Status PENDING  Incomplete         Studies: Dg Abd 1 View  12/24/2015  CLINICAL DATA:  Right percutaneous nephrostomy and stent placement. EXAM:  ABDOMEN - 1 VIEW; DG C-ARM 1-60 MIN-NO REPORT COMPARISON:  Previous examinations, including earlier today. FINDINGS: Three C-arm views of the right abdomen and pelvis demonstrate placement of a ureteral stent through a right percutaneous nephrostomy. The distal portion of the stent is coiled in the urinary bladder. A left ureteral stent is also in place. IMPRESSION: Right ureteral stent placement. Electronically Signed   By:  Claudie Revering M.D.   On: 12/24/2015 14:56   Dg C-arm 1-60 Min-no Report  12/24/2015  CLINICAL DATA: stones C-ARM 1-60 MINUTES Fluoroscopy was utilized by the requesting physician.  No radiographic interpretation.   Ir  Nephroureteral Cath Place Right  12/24/2015  INDICATION: 65 year old male with a history of bilateral nephrolithiasis. He underwent left-sided percutaneous nephrolithotomy last week and was readmitted for fever and disorientation. He has un treated extensive right-sided nephrolithiasis and requires percutaneous nephro ureteral access in anticipation of right PCNL later today. EXAM: IR NEPHROURETERAL CATH PLACEMENT RIGHT COMPARISON:  CT scan 12/22/2015 MEDICATIONS: Patient is on scheduled Maxipime every 8 hours. No additional antibiotic prophylaxis was administered. ANESTHESIA/SEDATION: Fentanyl 100 mcg IV; Versed 6 mg IV Moderate Sedation Time:  33 The patient was continuously monitored during the procedure by the interventional radiology nurse under my direct supervision. CONTRAST:  73mL ISOVUE-300 IOPAMIDOL (ISOVUE-300) INJECTION 61% - administered into the collecting system(s) FLUOROSCOPY TIME:  Fluoroscopy Time: 6 minutes 48 seconds (678 mGy). COMPLICATIONS: None immediate. Estimated blood loss:  0 PROCEDURE: Informed written consent was obtained from the patient after a thorough discussion of the procedural risks, benefits and alternatives. All questions were addressed. Maximal Sterile Barrier Technique was utilized including caps, mask, sterile gowns, sterile gloves,  sterile drape, hand hygiene and skin antiseptic. A timeout was performed prior to the initiation of the procedure. The left flank was imaged fluoroscopically. The stone burden appears to have shifted compared to the recent prior CT images. The stones previously noted in a posterior upper pole calyx appear to have settled more inferiorly. As such, the central stones in the renal pelvis were initially targeted. Under fluoroscopic guidance, a 21 gauge Chiba needle was carefully advanced into the renal pelvis. Aspiration yields clear urine confirming needle placement. Contrast was then injected through connecting tubing to opacifying the renal pelvis. Several anterior calices were also opacified. The posterior upper pole calices are not well seen. A small volume of air was then injected into the kidney. The air successfully filled the posterior upper pole calices. A suitable upper pole calyx was selected and the overlying skin marked. Local anesthesia was attained by infiltration with 1% lidocaine. Again, under fluoroscopic guidance, a second 21 gauge Chiba needle was carefully advanced into the posterior upper pole calyx. Once the needle tip appear to be within the calyx, aspiration yielded free air confirming. A small amount of contrast was injected opacifying the renal collecting system. A wire was then advanced into the renal pelvis. The needle was exchanged for the Accustick sheath. A small amount of contrast was injected through the Accustick sheath further confirming location within the renal pelvis. An Amplatz wire was then carefully advanced past the UPJ stone and into the ureter. The Accustick sheath was then exchanged for a 5 French angled catheter. The angled catheter was advanced over the wire and into the urinary bladder. The wire was removed. The catheter was secured to the skin with 0 silk suture and capped. A sterile bandage was applied. The patient tolerated the procedure well. IMPRESSION: 1. Shifting  stone burden. The stones previously noted in the posterior upper pole calyx appear to have settled more inferiorly. 2. Successful placement of a nephro ureteral catheter via a posterior upper pole calyx which should allow access to the majority of the stone burden. Signed, Criselda Peaches, MD [facility] Electronically Signed   By: Jacqulynn Cadet M.D.   On: 12/24/2015 11:17  Scheduled Meds: . ceFEPime (MAXIPIME) IV  2 g Intravenous Q8H  . docusate sodium  250 mg Oral Daily  . insulin regular human CONCENTRATED  25-85 Units Subcutaneous QAC lunch  . insulin regular human CONCENTRATED  25-85 Units Subcutaneous QAC supper  . insulin regular human CONCENTRATED  35-105 Units Subcutaneous QAC breakfast  . pantoprazole  40 mg Oral Daily  . pregabalin  100 mg Oral TID  . simvastatin  40 mg Oral QHS   Continuous Infusions: . 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 [facility] Pager [phone]  If 7PM-7AM, please contact night-coverage [email or web address] Password [email or web address] 12/25/2015, 11:00 AM    LOS: 3 days

## 2015-12-25 NOTE — Progress Notes (Signed)
Pt's wife asked that cbg be checked at MN and 0400.  MN cbg is 321.  Explained to wife that pt had the 75 units of Humulin U500 at 2200 and pt was seen eating another container of applesauce after having a clear liq tray which was all sugar.  Will recheck cbg at 0400 and if still elevated will call NP at that time.  Wife agreeable. Fara Olden P

## 2015-12-25 NOTE — Progress Notes (Signed)
OT Cancellation Note  Patient Details Name: [name] MRN: [id number] DOB: 1951-03-09   Cancelled Treatment:    Reason Eval/Treat Not Completed: Other (comment)  Pt states he has been up walking with nursing, took a bath and feels near baseline.  Will sign off  Barnaby Rippeon 12/25/2015, 2:03 PM  Lesle Chris, OTR/L [phone] 12/25/2015

## 2015-12-25 NOTE — Progress Notes (Signed)
Pharmacy Antibiotic Note  [name] is a 65 y.o. male admitted on 12/21/2015 with UTI. Vancomycin and cefepime were started on admission for pyelonephritis and PNA.  MRSA PCR negative with abx de-escalated to cefepime on 4/8.  S/p R perc nephrostomy and nephroureteral stent placement on 4/10 but for some reason cefepime was discontinued post procedure by OR staff.  Sputum culture now growing pseudomonas.  Spoke to Dr. Algis Liming, ok to resume cefepime back for patient.  Plan: - Cefepime 2 gm IV q8h - Follow cultures, clinical course  ____________________________  Height: 6\' 1"  (185.4 cm) Weight: 270 lb 1 oz (122.5 kg) IBW/kg (Calculated) : 79.9  Temp (24hrs), Avg:98.7 F (37.1 C), Min:98 F (36.7 C), Max:99 F (37.2 C)   Recent Labs Lab 12/19/15 0745 12/20/15 1216 12/21/15 0446 12/21/15 2126 12/21/15 2131 12/22/15 0330 12/22/15 0641 12/23/15 0343 12/24/15 1553  WBC 10.2 11.4* 10.5  --  13.2*  --   --  7.3  --   CREATININE 0.90 0.98 0.94  --  1.00  --   --  0.72 0.83  LATICACIDVEN  --   --   --  1.44  --  1.4 1.7  --   --     Estimated Creatinine Clearance: 126.5 mL/min (by C-G formula based on Cr of 0.83).    Allergies  Allergen Reactions  . Symbicort [Budesonide-Formoterol Fumarate] Other (See Comments)    Hyperglycemia   . Advair Diskus [Fluticasone-Salmeterol] Other (See Comments)    hyperglycemia  . Hydromorphone Nausea And Vomiting    Itching   . Morphine Itching  . Soap Itching and Other (See Comments)    Other Reaction: ivory soap=itching     Antimicrobials this admission: 4/7 >> Cefepime >>  4/8 >> Vanc >> 4/8  Dose adjustments this admission: n/a  Microbiology results: 4/7 BCx x2: ngtd 4/7 UCx: NGF 4/8 MRSA PCR:  neg 4/8 Respiratory virus panel: sent 4/8 Strep pneumo ur antigen: neg 4/8 Legionella ur antigen: pending 4/8 Influenza PCR: neg 4/9 Sputum: moderate pseudomonas    Thank you for allowing pharmacy to be a part of this patient's  care.  Dia Sitter, PharmD, BCPS 12/25/2015 9:12 AM

## 2015-12-25 NOTE — Progress Notes (Signed)
PT Cancellation Note  Patient Details Name: [name] MRN: [id number] DOB: 31-Dec-1950   Cancelled Treatment:     Pt declined due to pain and stated he amb earlier today.  Spoke to RN and she stated pt only amb to the bathroom and back.     Nathanial Rancher 12/25/2015, 4:20 PM

## 2015-12-25 NOTE — Clinical Documentation Improvement (Signed)
Internal Medicine  Can the diagnosis of altered mental status be further specified in progress notes & discharge summary?   Metabolic encephalopathy  Other  Clinically Undetermined  Document any associated diagnoses/conditions.   Supporting Information:  65 year old with chief complaints of fever and altered mental status  ED Provider note Pt diaphoretic with rigors. Per family, pt became increasingly lethargic and altered this afternoon.  Neurological: He is alert. He is disoriented.   H&P Could not be reviewed accurately due to altered mental status. Neuro: confused, oriented to place and person, not to time, cranial nerves II-XII grossly intact, moves all extremities normally.  4/9 progress note Confusion - Likely secondary to sepsis. Resolved.  Treatment Sepsis treatment: IVF and IV abx    Please exercise your independent, professional judgment when responding. A specific answer is not anticipated or expected.   Thank You,  [place] [phone]

## 2015-12-26 DIAGNOSIS — I10 Essential (primary) hypertension: Secondary | ICD-10-CM

## 2015-12-26 DIAGNOSIS — J189 Pneumonia, unspecified organism: Secondary | ICD-10-CM

## 2015-12-26 DIAGNOSIS — J151 Pneumonia due to Pseudomonas: Secondary | ICD-10-CM

## 2015-12-26 DIAGNOSIS — N200 Calculus of kidney: Secondary | ICD-10-CM

## 2015-12-26 DIAGNOSIS — N12 Tubulo-interstitial nephritis, not specified as acute or chronic: Secondary | ICD-10-CM

## 2015-12-26 LAB — CBC
HEMATOCRIT: 28.5 % — AB (ref 39.0–52.0)
HEMOGLOBIN: 9.7 g/dL — AB (ref 13.0–17.0)
MCH: 33.1 pg (ref 26.0–34.0)
MCHC: 34 g/dL (ref 30.0–36.0)
MCV: 97.3 fL (ref 78.0–100.0)
PLATELETS: 346 10*3/uL (ref 150–400)
RBC: 2.93 MIL/uL — AB (ref 4.22–5.81)
RDW: 14.9 % (ref 11.5–15.5)
WBC: 6.7 10*3/uL (ref 4.0–10.5)

## 2015-12-26 LAB — CULTURE, RESPIRATORY W GRAM STAIN: Gram Stain: NONE SEEN

## 2015-12-26 LAB — BASIC METABOLIC PANEL
ANION GAP: 9 (ref 5–15)
BUN: 7 mg/dL (ref 6–20)
CHLORIDE: 105 mmol/L (ref 101–111)
CO2: 22 mmol/L (ref 22–32)
Calcium: 9 mg/dL (ref 8.9–10.3)
Creatinine, Ser: 0.8 mg/dL (ref 0.61–1.24)
GFR calc Af Amer: >60 mL/min (ref >60)
GLUCOSE: 255 mg/dL — AB (ref 65–99)
POTASSIUM: 3.8 mmol/L (ref 3.5–5.1)
Sodium: 136 mmol/L (ref 135–145)

## 2015-12-26 LAB — GLUCOSE, CAPILLARY
GLUCOSE-CAPILLARY: 241 mg/dL — AB (ref 65–99)
GLUCOSE-CAPILLARY: 96 mg/dL (ref 65–99)
Glucose-Capillary: 308 mg/dL — ABNORMAL HIGH (ref 65–99)

## 2015-12-26 MED ORDER — HEPARIN SOD (PORK) LOCK FLUSH 100 UNIT/ML IV SOLN
250.0000 [IU] | INTRAVENOUS | Status: AC | PRN
  Administered 2015-12-26: 250 [IU]

## 2015-12-26 MED ORDER — OXYCODONE HCL 30 MG PO TABS: 30.0000 mg | ORAL_TABLET | ORAL | Status: DC | PRN

## 2015-12-26 MED ORDER — SODIUM CHLORIDE 0.9% FLUSH
10.0000 mL | INTRAVENOUS | Status: DC | PRN
  Administered 2015-12-26: 10 mL
  Filled 2015-12-26: qty 40

## 2015-12-26 MED ORDER — DEXTROSE 5 % IV SOLN: 2.0000 g | Freq: Three times a day (TID) | INTRAVENOUS | Status: DC

## 2015-12-26 NOTE — Progress Notes (Signed)
Pt selected [facility] for HH. Referral given to in house rep.  

## 2015-12-26 NOTE — Discharge Summary (Addendum)
Physician Discharge Summary  [name] [facility] [id number] DOB: 04/11/1951 DOA: 12/21/2015  PCP: Wendie Agreste, MD  Admit date: 12/21/2015 Discharge date: 12/26/2015  Recommendations for Outpatient Follow-up:  1. Pt will need to follow up with PCP in 2 weeks post discharge 2. Please obtain BMP in one week 3. Continue cefepime 2 g IV every 8 hours through 01/04/2016 4. Discontinue PICC line after last dose of antibiotics on 01/04/2016  Discharge Diagnoses:  Postoperative Sepsis secondary to presumed acute pyelonephritis and Pseudomonas healthcare associated pneumonia (RLL) - Treated with IV fluids and started IV vancomycin and cefepime. - RSV panel: Negative and urine Legionella antigen: Negative. Flu panel PCR: Negative. Urinary pneumococcal antigen: Negative. Lactate normal. Pro-calcitonin 0.8. - Urine culture 1: Negative and blood cultures 2: Negative to date. - Chest x-ray was negative but pneumonia appeared on CT renal stone study. - MRSA PCR negative. DC vancomycin (upon chart review, received a dose of vancomycin on 4/8 AM.) -Clinically improved with no further fever or leukocytosis - Sputum culture: Moderate Pseudomonas-resistant quinolones -PICC line placed 12/26/2015 -Discharge home with cefepime 2 g IV every 8 hours through 01/04/2016--14 days total  Status post left PCNL 4/6/nephrolithiasis - Urology consultation and follow-up appreciated. - S/P removal left nephrostomy tube on 4/9.  - S/P R PCNL 4/10. - Doing well. Foley discontinued 4/11.  Essential hypertension - Fluctuating , but remains clinically stable -follow-up with primary care physician  Chronic pain - Follows with pain management as outpatient. Continue home regimen of oxycodone IR - Left PCNL removed 4/9. - Denies pain today.  HLD:  - Last LDL was 50 on 12/06/15  - Continue home medications: Zocor  Depression and anxiety: - Continue home medications: ativan, Cymbalta   DM-II/IDDM:  - Last  A1c 7.3 on 12/06/15, fairly controled.  - Patient is on high-dose insulin's at home i.e. U 500. Discussed at length with patient's spouse on 4/8. She stated that patient was on home regimen of these insulins during recent hospitalization by urology and when he had a TIA and was admitted at [facility].  - Discussed extensively with pharmacy on 4/8 and placed patient back on his home regimen of U5 100. Mildly uncontrolled CBGs. Monitor closely. - Diabetes coordinator consulted and will meet with patient and family on 4/11. - Received a dose of Decadron in OR yesterday and had clear liquids with sweet stuff last night and hence CBG 329 at midnight. Improved this morning with fasting blood sugar of 174. Continue current management. - Patient noncompliant with diet.  CAD:  - No CP - continue ASA and Zocor  GERD/large hiatal hernia: - Protonix   Confusion/Acute metabolic encephalopathy - Likely secondary to sepsis. Resolved.  Morbid obesity/Body mass index is 35.64 kg/(m^2).  Discharge Condition: stable  Disposition: home Follow-up Information    Follow up with [name], MD. Call on 12/27/2015.   Specialty:  Urology   Why:  neph tube removal   Contact information:   [place] [address] [phone]       Diet:carb modified Wt Readings from Last 3 Encounters:  12/23/15 122.5 kg (270 lb 1 oz)  12/20/15 124.739 kg (275 lb)  12/19/15 124.739 kg (275 lb)    History of present illness:  65 y.o. male with PMH of hypertension, hyperlipidemia, IDDM on high doses of home insulin/U500, GERD, depression, PVD, kidney stone, CAD, kidney stones, s/p left PCNL by Dr. Alyson Ingles on 4/6, discharged home on 4/7, presented to the ED same afternoon with complaints  of fever, confusion, lethargy, hematuria, dysuria & left flank pain. In ED, patient was found to have positive urinalysis with moderate amount of leukocytes, WBC 13.2, temperature 104.4, tachycardia, tachypnea, oxygen  desaturated to 83%, electrolytes renal function okay, lactate 1.44. Chest x-ray showed new patchy infiltration in RLL. Admitted to stepdown unit for sepsis secondary to acute pyelonephritis and possible HCAP. Urology consulted. Improving. Transferred to medical bed 4/9. S/P right PCNL 4/10. Sputum culture: Pseudomonas-resistant to quinolones.  PICC line place on 12/26/15.  Home with cefepime x 9 more days to complete 14 days of therapy.  Consultants: Urology--Dr. Alyson Ingles  Discharge Exam: Filed Vitals:   12/25/15 2050 12/26/15 0916  BP: 146/56   Pulse: 88   Temp: 98.8 F (37.1 C) 98.9 F (37.2 C)  Resp: 17    Filed Vitals:   12/25/15 0451 12/25/15 1400 12/25/15 2050 12/26/15 0916  BP: 142/65 146/60 146/56   Pulse: 77 78 88   Temp: 98 F (36.7 C) 98.5 F (36.9 C) 98.8 F (37.1 C) 98.9 F (37.2 C)  TempSrc: Oral Oral Oral Axillary  Resp: 18 18 17    Height:      Weight:      SpO2: 99% 100% 99%    General: A&O x 3, NAD, pleasant, cooperative Cardiovascular: RRR, no rub, no gallop, no S3 Respiratory: CTAB, no wheeze, no rhonchi Abdomen:soft, nontender, nondistended, positive bowel sounds Extremities: No edema, No lymphangitis, no petechiae; R-AKA without draining wounds  Discharge Instructions      Discharge Instructions    Diet - low sodium heart healthy    Complete by:  As directed      Increase activity slowly    Complete by:  As directed             Medication List    TAKE these medications        aspirin EC 81 MG tablet  Take 81 mg by mouth every morning. Reported on 12/05/2015     ceFEPIme 2 g in dextrose 5 % 50 mL  Inject 2 g into the vein every 8 (eight) hours. Stop after last dose on 01/04/16     ciprofloxacin 0.3 % ophthalmic solution  Commonly known as:  CILOXAN  Place 2 drops into both eyes every 4 (four) hours while awake. Administer 1 drop, every 2 hours, while awake, for 2 days. Then 1 drop, every 4 hours, while awake, for the next 5 days.      DEXILANT 30 MG capsule  Generic drug:  Dexlansoprazole  TAKE 1 CAPSULE(30 MG) BY MOUTH DAILY     docusate sodium 250 MG capsule  Commonly known as:  COLACE  Take 250 mg by mouth daily.     DULoxetine 60 MG capsule  Commonly known as:  CYMBALTA  TAKE 1 CAPSULE(30 MG) BY MOUTH DAILY     furosemide 40 MG tablet  Commonly known as:  LASIX  TAKE 1 TABLET BY MOUTH EVERY DAY     HUMULIN R U-500 KWIKPEN 500 UNIT/ML kwikpen  Generic drug:  insulin regular human CONCENTRATED  Inject 25-105 Units into the skin 3 (three) times daily. Sliding scale instructions per [facility]: "65+5 units at breakfast, 45+5 units at lunch and 45+5 units at supper." This means: CBG <70 = 0 units CBG 70-90 = 35 units if breakfast, 25 units if lunch or dinner. CBG 91-130 = base amount(65 units if breakfast, 45 units if lunch or dinner) CBG 131-150 = base amount + 5 units CBG 151-200 = +  5 more units CBG 201-250 = + 5 more units CBG 251-300 = + 5 more units CBG 301-350 = + 5 more units CBG 351-400 = + 5 more units CBG 401-450 = + 5 more units CBG >450 = + 5 more units     LORazepam 0.5 MG tablet  Commonly known as:  ATIVAN  Take 1 tablet at bedtime prn anxiety.     miconazole 2 % powder  Commonly known as:  MICOTIN  Apply topically as needed for itching. For rash between legs , patient using 2 times daily     nystatin powder  Commonly known as:  MYCOSTATIN  Apply topically 3 (three) times daily.     ondansetron 4 MG tablet  Commonly known as:  ZOFRAN  Take 1-2 tablets (4-8 mg total) by mouth every 8 (eight) hours as needed.     oxyCODONE 15 MG immediate release tablet  Commonly known as:  ROXICODONE  Limit 1 tablet by mouth 3-5 times per day if tolerated     oxyCODONE 5 MG immediate release tablet  Commonly known as:  ROXICODONE  Limit 1 tab by mouth 3-5 times per day if tolerated  NOTE OXYCODONE IS 5 MG TAB     oxycodone 30 MG immediate release tablet  Commonly known as:  ROXICODONE  Take  1 tablet (30 mg total) by mouth every 4 (four) hours as needed.     pregabalin 100 MG capsule  Commonly known as:  LYRICA  Take 100 mg by mouth 3 (three) times daily.     promethazine 25 MG tablet  Commonly known as:  PHENERGAN  Take 1 tablet (25 mg total) by mouth every 8 (eight) hours as needed for nausea or vomiting.     simvastatin 40 MG tablet  Commonly known as:  ZOCOR  Take 1 tablet (40 mg total) by mouth at bedtime.     traZODone 100 MG tablet  Commonly known as:  DESYREL  TAKE 1 TABLET BY MOUTH AT BEDTIME     zolpidem 10 MG tablet  Commonly known as:  AMBIEN  Take 1 tablet (10 mg total) by mouth at bedtime as needed for sleep.         The results of significant diagnostics from this hospitalization (including imaging, microbiology, ancillary and laboratory) are listed below for reference.    Significant Diagnostic Studies: Dg Abd 1 View  12/24/2015  CLINICAL DATA:  Right percutaneous nephrostomy and stent placement. EXAM: ABDOMEN - 1 VIEW; DG C-ARM 1-60 MIN-NO REPORT COMPARISON:  Previous examinations, including earlier today. FINDINGS: Three C-arm views of the right abdomen and pelvis demonstrate placement of a ureteral stent through a right percutaneous nephrostomy. The distal portion of the stent is coiled in the urinary bladder. A left ureteral stent is also in place. IMPRESSION: Right ureteral stent placement. Electronically Signed   By: Claudie Revering M.D.   On: 12/24/2015 14:56   Dg Abd 1 View  12/20/2015  CLINICAL DATA:  Left renal stone removal and stent placement EXAM: ABDOMEN - 1 VIEW COMPARISON:  12/19/2015.  11/05/2015. FINDINGS: C-arm images show a left nephrostomy tube in place. Wire in catheter are in place extending from this approach into the bladder. Contrast injection shows opacification of the renal collecting system. Limited resolution. IMPRESSION: Intraprocedural films for left stone removal and stent placement. Electronically Signed   By: Nelson Chimes  M.D.   On: 12/20/2015 12:37   Ct Head Wo Contrast  12/05/2015  CLINICAL DATA:  Memory loss and generalized weakness EXAM: CT HEAD WITHOUT CONTRAST TECHNIQUE: Contiguous axial images were obtained from the base of the skull through the vertex without intravenous contrast. COMPARISON:  None. FINDINGS: There is no mass effect, midline shift, or acute intracranial hemorrhage. Global atrophy is present. There are chronic ischemic changes in the periventricular white matter as well as the gray-white junction of the right frontal lobe. Mastoid air cells are clear. Cranium is intact. Dense mucosal thickening in the right maxillary sinus. There is scattered mucosal thickening in the ethmoid air cells worse on the right. IMPRESSION: Chronic ischemic changes and atrophy. No acute intracranial pathology. Electronically Signed   By: Marybelle Killings M.D.   On: 12/05/2015 18:50   Mr Brain Wo Contrast  12/05/2015  CLINICAL DATA:  Initial evaluation for acute right-sided weakness with dizziness and headache. EXAM: MRI HEAD WITHOUT CONTRAST MRA HEAD WITHOUT CONTRAST TECHNIQUE: Multiplanar, multiecho pulse sequences of the brain and surrounding structures were obtained without intravenous contrast. Angiographic images of the head were obtained using MRA technique without contrast. COMPARISON:  Prior CT from earlier the same day. FINDINGS: MRI HEAD FINDINGS Study limited due to motion artifact and patient's inability to tolerate the full length of the exam. Diffusion-weighted sequences demonstrate no abnormal foci of restricted diffusion to suggest acute infarct. Gray-white matter differentiation grossly maintained. Major intracranial vascular flow voids are preserved. Mild atrophy with chronic small vessel ischemic disease. No definite mass lesion identified. No mass effect or midline shift. No hydrocephalus. No extra-axial fluid collection. Major dural sinuses are grossly patent. No definite acute abnormality about the orbits.  Sequela prior bilateral lens extraction noted. Moderate mucosal thickening within the right maxilla sinus and ethmoidal air cells on the right. No significant mastoid effusion. Bone marrow signal intensity grossly normal. No scalp soft tissue abnormality. MRA HEAD FINDINGS ANTERIOR CIRCULATION: Visualized distal cervical segments of the internal carotid arteries are patent with antegrade flow. Petrous, cavernous, and supraclinoid segments patent without flow limiting stenosis. A1 segments, anterior communicating artery, and anterior cerebral arteries patent bilaterally. M1 segments patent without stenosis or occlusion. MCA bifurcations normal. Distal MCA branches symmetric and fairly well opacified bilaterally. POSTERIOR CIRCULATION: Vertebral arteries patent to the vertebrobasilar junction. Right vertebral artery dominant. Posterior inferior cerebral arteries patent proximally. Basilar artery patent to its distal aspect. Superior cerebellar arteries well opacified bilaterally. Hypoplastic left P1 segment with prominent left posterior communicating artery. Small right posterior communicating artery noted as well. PCAs well opacified to their distal aspects. No aneurysm. IMPRESSION: MRI HEAD IMPRESSION: 1. Limited study due to motion artifact and patient's inability to tolerate the full length of the exam. 2. No acute intracranial infarct. No definite other acute intracranial process identified. 3. Mild atrophy with chronic small vessel ischemic disease. MRA HEAD IMPRESSION: Negative intracranial MRA. No large or proximal arterial branch occlusion. No high-grade or correctable stenosis. Electronically Signed   By: Jeannine Boga M.D.   On: 12/05/2015 23:55   Dg Chest Port 1 View  12/21/2015  CLINICAL DATA:  Sepsis.  Fever and tachycardia. EXAM: PORTABLE CHEST 1 VIEW COMPARISON:  12/07/2015 FINDINGS: Chronic right chest wall deformities and pleural thickening due to remote rib fractures. Mild chronic  interstitial coarsening. No confluent alveolar opacity. No large effusions. Unchanged cardiomegaly and aortic tortuosity. IMPRESSION: No acute cardiopulmonary findings. Electronically Signed   By: Andreas Newport M.D.   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 Right-sided rib deformities are stable. Left lung remains clear. Upper normal heart size. No pneumothorax. IMPRESSION: No active cardiopulmonary disease.  Postop changes are noted. Electronically Signed   By: Marybelle Killings M.D.   On: 12/07/2015 11:53   Dg C-arm 1-60 Min-no Report  12/24/2015  CLINICAL DATA: stones C-ARM 1-60 MINUTES Fluoroscopy was utilized by the requesting physician.  No radiographic interpretation.   Dg C-arm 1-60 Min-no Report  12/20/2015  CLINICAL DATA: left perc C-ARM 1-60 MINUTES Fluoroscopy was utilized by the requesting physician.  No radiographic interpretation.   Mr Jodene Nam Head/brain Wo Cm  12/05/2015  CLINICAL DATA:  Initial evaluation for acute right-sided weakness with dizziness and headache. EXAM: MRI HEAD WITHOUT CONTRAST MRA HEAD WITHOUT CONTRAST TECHNIQUE: Multiplanar, multiecho pulse sequences of the brain and surrounding structures were obtained without intravenous contrast. Angiographic images of the head were obtained using MRA technique without contrast. COMPARISON:  Prior CT from earlier the same day. FINDINGS: MRI HEAD FINDINGS Study limited due to motion artifact and patient's inability to tolerate the full length of the exam. Diffusion-weighted sequences demonstrate no abnormal foci of restricted diffusion to suggest acute infarct. Gray-white matter differentiation grossly maintained. Major intracranial vascular flow voids are preserved. Mild atrophy with chronic small  vessel ischemic disease. No definite mass lesion identified. No mass effect or midline shift. No hydrocephalus. No extra-axial fluid collection. Major dural sinuses are grossly patent. No definite acute abnormality about the orbits. Sequela prior bilateral lens extraction noted. Moderate mucosal thickening within the right maxilla sinus and ethmoidal air cells on the right. No significant mastoid effusion. Bone marrow signal intensity grossly normal. No scalp soft tissue abnormality. MRA HEAD FINDINGS ANTERIOR CIRCULATION: Visualized distal cervical segments of the internal carotid arteries are patent with antegrade flow. Petrous, cavernous, and supraclinoid segments patent without flow limiting stenosis. A1 segments, anterior communicating artery, and anterior cerebral arteries patent bilaterally. M1 segments patent without stenosis or occlusion. MCA bifurcations normal. Distal MCA branches symmetric and fairly well opacified bilaterally. POSTERIOR CIRCULATION: Vertebral arteries patent to the vertebrobasilar junction. Right vertebral artery dominant. Posterior inferior cerebral arteries patent proximally. Basilar artery patent to its distal aspect. Superior cerebellar arteries well opacified bilaterally. Hypoplastic left P1 segment with prominent left posterior communicating artery. Small right posterior communicating artery noted as well. PCAs well opacified to their distal aspects. No aneurysm. IMPRESSION: MRI HEAD IMPRESSION: 1. Limited study due to motion artifact and patient's inability to tolerate the full length of the exam. 2. No acute intracranial infarct. No definite other acute intracranial process identified. 3. Mild atrophy with chronic small vessel ischemic disease. MRA HEAD IMPRESSION: Negative intracranial MRA. No large or proximal arterial branch occlusion. No high-grade or correctable stenosis. Electronically Signed   By: Jeannine Boga M.D.   On: 12/05/2015 23:55   Ct Renal Stone  Study  12/22/2015  CLINICAL DATA:  Fever and confusion one day after percutaneous nephrolithotomy. EXAM: CT ABDOMEN AND PELVIS WITHOUT CONTRAST TECHNIQUE: Multidetector CT imaging of the abdomen and pelvis was performed following the standard protocol without IV contrast. COMPARISON:  11/05/2015 FINDINGS: There is a percutaneous nephrostomy on the left, extending into the left renal pelvis. There is a left ureteral stent with proximal end in the left renal pelvis and distal end in the urinary bladder. No calculi are evident within the ureter. There is near complete removal of calculi from the  left renal collecting system. There are 2 tiny residual calculus fragments in a left lower pole calyx, measuring up to 3 mm. There is a 3 x 4 mm calculus in a posterior left midpole calyx. There is mild stranding around the left renal pelvis and left ureter but no urinoma or other abnormal fluid collection. No perinephric hematoma. The right collecting system contains numerous calculi measuring up to 2 cm. This is unchanged from 11/05/2015. There are unremarkable unenhanced appearances of the liver, spleen, pancreas, biliary system and adrenals. The abdominal aorta is normal in caliber. There is mild atherosclerotic calcification. There is no adenopathy in the abdomen or pelvis. There is a large hiatal hernia.  Bowel is otherwise unremarkable. There is new patchy airspace opacity in the right lower lobe. IMPRESSION: 1. Left percutaneous nephrostomy catheter and left ureteral stent appear unchanged in position from the intra procedural images of 12/20/2015. Near complete removal of calculi from the left renal collecting system. No hematoma, urinoma or other immediate complication is evident. No ureteral calculus. 2. Unchanged right nephrolithiasis. 3. New patchy airspace opacity in the right lower lobe posteriorly may represent pneumonia or aspiration. 4. Large hiatal hernia. Electronically Signed   By: Andreas Newport M.D.    On: 12/22/2015 01:14   Ir Nephrostomy Placement Left  12/19/2015  INDICATION: 65 year old male with history of nephrolithiasis. EXAM: IR NEPHROSTOMY PLACEMENT LEFT COMPARISON:  None. MEDICATIONS: 400 mg Cipro; The antibiotic was administered in an appropriate time frame prior to skin puncture. ANESTHESIA/SEDATION: Fentanyl 100 mcg IV; Versed 2.5 mg IV 4 mg IV Zofran Moderate Sedation Time:  26 minutes The patient was continuously monitored during the procedure by the interventional radiology nurse under my direct supervision. CONTRAST:  19mL ISOVUE-300 IOPAMIDOL (ISOVUE-300) INJECTION 61% - administered into the collecting system(s) FLUOROSCOPY TIME:  Fluoroscopy Time: 3 minutes 18 seconds (313 mGy). COMPLICATIONS: None PROCEDURE: Informed written consent was obtained from the patient after a thorough discussion of the procedural risks, benefits and alternatives. All questions were addressed. Maximal Sterile Barrier Technique was utilized including caps, mask, sterile gowns, sterile gloves, sterile drape, hand hygiene and skin antiseptic. A timeout was performed prior to the initiation of the procedure. Patient positioned prone position on the fluoroscopy table. Ultrasound survey of the left flank was performed with images stored and sent to PACs. The patient was then prepped and draped in the usual sterile fashion. 1% lidocaine was used to anesthetize the skin and subcutaneous tissues for local anesthesia. A Chiba needle was then used to access a posterior calyx with ultrasound and fluoroscopy guidance. With spontaneous urine returned through the needle, passage of an 018 micro wire into the collecting system was performed under fluoroscopy. A small incision was made with an 11 blade scalpel, and the needle was removed from the wire. An Accustick system was then advanced over the wire into the collecting system under fluoroscopy. The metal stiffener and inner dilator were removed, and contrast infusion confirmed  position. Contrast infusion also confirmed a puncture into a posterior mid calyx. A glide wire was passed into the collecting system to the urinary bladder. A 5 French multipurpose catheter was advanced over the Glidewire to the urinary bladder. Final image was stored. Catheter was sutured in position and capped. Patient tolerated the procedure well and remained hemodynamically stable throughout. No complications were encountered and no significant blood loss encountered IMPRESSION: Status post image guided placement of left urinary collecting system access with placement of a 5 [place] multipurpose catheter through a posterior  mid calyx to the urinary bladder. Signed, Dulcy Fanny. Earleen Newport DO [facility] Radiology PLAN: The patient has an operative appointment for percutaneous nephrolithotomy tomorrow morning. Electronically Signed   By: Corrie Mckusick D.O.   On: 12/19/2015 11:11   Ir  Nephroureteral Cath Place Right  12/24/2015  INDICATION: 65 year old male with a history of bilateral nephrolithiasis. He underwent left-sided percutaneous nephrolithotomy last week and was readmitted for fever and disorientation. He has un treated extensive right-sided nephrolithiasis and requires percutaneous nephro ureteral access in anticipation of right PCNL later today. EXAM: IR NEPHROURETERAL CATH PLACEMENT RIGHT COMPARISON:  CT scan 12/22/2015 MEDICATIONS: Patient is on scheduled Maxipime every 8 hours. No additional antibiotic prophylaxis was administered. ANESTHESIA/SEDATION: Fentanyl 100 mcg IV; Versed 6 mg IV Moderate Sedation Time:  33 The patient was continuously monitored during the procedure by the interventional radiology nurse under my direct supervision. CONTRAST:  77mL ISOVUE-300 IOPAMIDOL (ISOVUE-300) INJECTION 61% - administered into the collecting system(s) FLUOROSCOPY TIME:  Fluoroscopy Time: 6 minutes 48 seconds (678 mGy). COMPLICATIONS: None immediate. Estimated  blood loss:  0 PROCEDURE: Informed written consent was obtained from the patient after a thorough discussion of the procedural risks, benefits and alternatives. All questions were addressed. Maximal Sterile Barrier Technique was utilized including caps, mask, sterile gowns, sterile gloves, sterile drape, hand hygiene and skin antiseptic. A timeout was performed prior to the initiation of the procedure. The left flank was imaged fluoroscopically. The stone burden appears to have shifted compared to the recent prior CT images. The stones previously noted in a posterior upper pole calyx appear to have settled more inferiorly. As such, the central stones in the renal pelvis were initially targeted. Under fluoroscopic guidance, a 21 gauge Chiba needle was carefully advanced into the renal pelvis. Aspiration yields clear urine confirming needle placement. Contrast was then injected through connecting tubing to opacifying the renal pelvis. Several anterior calices were also opacified. The posterior upper pole calices are not well seen. A small volume of air was then injected into the kidney. The air successfully filled the posterior upper pole calices. A suitable upper pole calyx was selected and the overlying skin marked. Local anesthesia was attained by infiltration with 1% lidocaine. Again, under fluoroscopic guidance, a second 21 gauge Chiba needle was carefully advanced into the posterior upper pole calyx. Once the needle tip appear to be within the calyx, aspiration yielded free air confirming. A small amount of contrast was injected opacifying the renal collecting system. A wire was then advanced into the renal pelvis. The needle was exchanged for the Accustick sheath. A small amount of contrast was injected through the Accustick sheath further confirming location within the renal pelvis. An Amplatz wire was then carefully advanced past the UPJ stone and into the ureter. The Accustick sheath was then exchanged for a  5 French angled catheter. The angled catheter was advanced over the wire and into the urinary bladder. The wire was removed. The catheter was secured to the skin with 0 silk suture and capped. A sterile bandage was applied. The patient tolerated the procedure well. IMPRESSION: 1. Shifting stone burden. The stones previously noted in the posterior upper pole calyx appear to have settled more inferiorly. 2. Successful placement of a nephro ureteral catheter via a posterior upper pole calyx which should allow access to the majority of the stone burden. Signed, Criselda Peaches, MD [facility] Electronically Signed   By: Dellis Filbert.D.  On: 12/24/2015 11:17     Microbiology: Recent Results (from the past 240 hour(s))  Blood Culture (routine x 2)     Status: None (Preliminary result)   Collection Time: 12/21/15  9:33 PM  Result Value Ref Range Status   Specimen Description BLOOD RIGHT [facility]  Final   Special Requests BOTTLES DRAWN AEROBIC AND ANAEROBIC 5CC  Final   Culture   Final    NO GROWTH 3 DAYS Performed at [facility]    Report Status PENDING  Incomplete  Blood Culture (routine x 2)     Status: None (Preliminary result)   Collection Time: 12/21/15 10:17 PM  Result Value Ref Range Status   Specimen Description BLOOD BLOOD LEFT FOREARM  Final   Special Requests BOTTLES DRAWN AEROBIC AND ANAEROBIC 5ML EA  Final   Culture   Final    NO GROWTH 3 DAYS Performed at [facility]    Report Status PENDING  Incomplete  Urine culture     Status: None   Collection Time: 12/22/15 12:20 AM  Result Value Ref Range Status   Specimen Description URINE, CLEAN CATCH  Final   Special Requests NONE  Final   Culture   Final    NO GROWTH 1 DAY Performed at [facility]    Report Status 12/23/2015 FINAL  Final  MRSA PCR Screening     Status: None   Collection Time: 12/22/15  4:22 AM  Result Value Ref Range Status     MRSA by PCR NEGATIVE NEGATIVE Final    Comment:        The GeneXpert MRSA Assay (FDA approved for NASAL specimens only), is one component of a comprehensive MRSA colonization surveillance program. It is not intended to diagnose MRSA infection nor to guide or monitor treatment for MRSA infections.   Respiratory virus panel     Status: None   Collection Time: 12/22/15  4:23 AM  Result Value Ref Range Status   Respiratory Syncytial Virus A Negative Negative Final   Respiratory Syncytial Virus B Negative Negative Final   Influenza A Negative Negative Final   Influenza B Negative Negative Final   Parainfluenza 1 Negative Negative Final   Parainfluenza 2 Negative Negative Final   Parainfluenza 3 Negative Negative Final   Metapneumovirus Negative Negative Final   Rhinovirus Negative Negative Final   Adenovirus Negative Negative Final    Comment: (NOTE) Performed At: [facility] [place], [place] [id number] Lindon Romp MD [id number]   Culture, sputum-assessment     Status: None   Collection Time: 12/23/15  6:49 AM  Result Value Ref Range Status   Specimen Description SPUTUM  Final   Special Requests NONE  Final   Sputum evaluation   Final    THIS SPECIMEN IS ACCEPTABLE. RESPIRATORY CULTURE REPORT TO FOLLOW.   Report Status 12/23/2015 FINAL  Final  Culture, respiratory (NON-Expectorated)     Status: None   Collection Time: 12/23/15  6:49 AM  Result Value Ref Range Status   Specimen Description SPUTUM  Final   Special Requests NONE  Final   Gram Stain   Final    NO WBC SEEN NO SQUAMOUS EPITHELIAL CELLS SEEN RARE GRAM NEGATIVE RODS Performed at [facility]    Culture   Final    MODERATE PSEUDOMONAS AERUGINOSA Performed at [facility]    Report Status 12/26/2015 FINAL  Final   Organism ID, Bacteria PSEUDOMONAS AERUGINOSA  Final  Susceptibility   Pseudomonas aeruginosa - MIC*    CEFEPIME 2 SENSITIVE Sensitive      CEFTAZIDIME 2 SENSITIVE Sensitive     CIPROFLOXACIN >=4 RESISTANT Resistant     GENTAMICIN 2 SENSITIVE Sensitive     IMIPENEM >=16 RESISTANT Resistant     PIP/TAZO 8 SENSITIVE Sensitive     TOBRAMYCIN <=1 SENSITIVE Sensitive     * MODERATE PSEUDOMONAS AERUGINOSA     Labs: Basic Metabolic Panel:  Recent Labs Lab 12/21/15 0446 12/21/15 2131 12/23/15 0343 12/24/15 1553 12/26/15 0442  NA 140 136 136 137 136  K 4.6 3.9 3.8 4.3 3.8  CL 104 100* 105 106 105  CO2 29 24 23  21* 22  GLUCOSE 224* 168* 163* 314* 255*  BUN 10 12 7 9 7   CREATININE 0.94 1.00 0.72 0.83 0.80  CALCIUM 9.5 9.8 9.3 9.4 9.0   Liver Function Tests:  Recent Labs Lab 12/21/15 2131  AST 19  ALT 16*  ALKPHOS 133*  BILITOT 1.5*  PROT 7.2  ALBUMIN 3.8   No results for input(s): LIPASE, AMYLASE in the last 168 hours. No results for input(s): AMMONIA in the last 168 hours. CBC:  Recent Labs Lab 12/20/15 1216 12/21/15 0446 12/21/15 2131 12/23/15 0343 12/26/15 0442  WBC 11.4* 10.5 13.2* 7.3 6.7  NEUTROABS  --   --  11.0*  --   --   HGB 11.7* 10.8* 12.6* 10.2* 9.7*  HCT 34.9* 32.6* 37.8* 29.3* 28.5*  MCV 102.6* 103.2* 102.2* 97.3 97.3  PLT 371 341 392 324 346   Cardiac Enzymes: No results for input(s): CKTOTAL, CKMB, CKMBINDEX, TROPONINI in the last 168 hours. BNP: Invalid input(s): POCBNP CBG:  Recent Labs Lab 12/25/15 1626 12/25/15 1647 12/25/15 1745 12/25/15 2211 12/26/15 0741  GLUCAP 41* 80 126* 79 308*    Time coordinating discharge:  Greater than 30 minutes  Signed:  Sinclair Alligood, DO [facility] Pager: [phone] 12/26/2015, 9:48 AM

## 2015-12-26 NOTE — Progress Notes (Addendum)
Assumed care of pt from [place], [place] at 1520.  I agree with her previous assessment. Will continue with plan of care.    Discharging home with PICC line.  IV team heparin locked PICC line.  Home health set up with [facility].  Pt in stable condition.  Going home with wife.

## 2015-12-26 NOTE — Progress Notes (Signed)
Peripherally Inserted Central Catheter/Midline Placement  The IV Nurse has discussed with the patient and/or persons authorized to consent for the patient, the purpose of this procedure and the potential benefits and risks involved with this procedure.  The benefits include less needle sticks, lab draws from the catheter and patient may be discharged home with the catheter.  Risks include, but not limited to, infection, bleeding, blood clot (thrombus formation), and puncture of an artery; nerve damage and irregular heat beat.  Alternatives to this procedure were also discussed.  PICC/Midline Placement Documentation        [name] 12/26/2015, 1:02 PM

## 2015-12-27 ENCOUNTER — Other Ambulatory Visit: Payer: Self-pay | Admitting: Physician Assistant

## 2015-12-27 ENCOUNTER — Other Ambulatory Visit: Payer: Self-pay | Admitting: Family Medicine

## 2015-12-27 DIAGNOSIS — I739 Peripheral vascular disease, unspecified: Secondary | ICD-10-CM | POA: Diagnosis not present

## 2015-12-27 DIAGNOSIS — E1165 Type 2 diabetes mellitus with hyperglycemia: Secondary | ICD-10-CM | POA: Diagnosis not present

## 2015-12-27 DIAGNOSIS — F329 Major depressive disorder, single episode, unspecified: Secondary | ICD-10-CM | POA: Diagnosis not present

## 2015-12-27 DIAGNOSIS — F419 Anxiety disorder, unspecified: Secondary | ICD-10-CM | POA: Diagnosis not present

## 2015-12-27 DIAGNOSIS — N10 Acute tubulo-interstitial nephritis: Secondary | ICD-10-CM | POA: Diagnosis not present

## 2015-12-27 DIAGNOSIS — N200 Calculus of kidney: Secondary | ICD-10-CM | POA: Diagnosis not present

## 2015-12-27 DIAGNOSIS — Z8781 Personal history of (healed) traumatic fracture: Secondary | ICD-10-CM | POA: Diagnosis not present

## 2015-12-27 DIAGNOSIS — Z792 Long term (current) use of antibiotics: Secondary | ICD-10-CM | POA: Diagnosis not present

## 2015-12-27 DIAGNOSIS — Z89511 Acquired absence of right leg below knee: Secondary | ICD-10-CM | POA: Diagnosis not present

## 2015-12-27 DIAGNOSIS — J151 Pneumonia due to Pseudomonas: Secondary | ICD-10-CM | POA: Diagnosis not present

## 2015-12-27 DIAGNOSIS — Z794 Long term (current) use of insulin: Secondary | ICD-10-CM | POA: Diagnosis not present

## 2015-12-27 DIAGNOSIS — I2510 Atherosclerotic heart disease of native coronary artery without angina pectoris: Secondary | ICD-10-CM | POA: Diagnosis not present

## 2015-12-27 DIAGNOSIS — Z6835 Body mass index (BMI) 35.0-35.9, adult: Secondary | ICD-10-CM | POA: Diagnosis not present

## 2015-12-27 DIAGNOSIS — Z936 Other artificial openings of urinary tract status: Secondary | ICD-10-CM | POA: Diagnosis not present

## 2015-12-27 DIAGNOSIS — G894 Chronic pain syndrome: Secondary | ICD-10-CM | POA: Diagnosis not present

## 2015-12-27 DIAGNOSIS — E11319 Type 2 diabetes mellitus with unspecified diabetic retinopathy without macular edema: Secondary | ICD-10-CM | POA: Diagnosis not present

## 2015-12-27 DIAGNOSIS — K449 Diaphragmatic hernia without obstruction or gangrene: Secondary | ICD-10-CM | POA: Diagnosis not present

## 2015-12-27 DIAGNOSIS — Z87891 Personal history of nicotine dependence: Secondary | ICD-10-CM | POA: Diagnosis not present

## 2015-12-27 DIAGNOSIS — T814XXA Infection following a procedure, initial encounter: Secondary | ICD-10-CM | POA: Diagnosis not present

## 2015-12-27 DIAGNOSIS — I10 Essential (primary) hypertension: Secondary | ICD-10-CM | POA: Diagnosis not present

## 2015-12-27 DIAGNOSIS — Z452 Encounter for adjustment and management of vascular access device: Secondary | ICD-10-CM | POA: Diagnosis not present

## 2015-12-27 DIAGNOSIS — Z7982 Long term (current) use of aspirin: Secondary | ICD-10-CM | POA: Diagnosis not present

## 2015-12-27 DIAGNOSIS — Z8614 Personal history of Methicillin resistant Staphylococcus aureus infection: Secondary | ICD-10-CM | POA: Diagnosis not present

## 2015-12-27 LAB — CULTURE, BLOOD (ROUTINE X 2)
Culture: NO GROWTH
Culture: NO GROWTH

## 2016-01-01 NOTE — Telephone Encounter (Signed)
Refilled, but continue routine follow-up with pain management specialist.

## 2016-01-02 ENCOUNTER — Encounter: Payer: Medicare Other | Admitting: Family Medicine

## 2016-01-02 NOTE — Telephone Encounter (Signed)
Faxed. Pt has appt w/pain clinic on 4/27.

## 2016-01-03 DIAGNOSIS — G894 Chronic pain syndrome: Secondary | ICD-10-CM | POA: Diagnosis not present

## 2016-01-03 DIAGNOSIS — J151 Pneumonia due to Pseudomonas: Secondary | ICD-10-CM | POA: Diagnosis not present

## 2016-01-03 DIAGNOSIS — N10 Acute tubulo-interstitial nephritis: Secondary | ICD-10-CM | POA: Diagnosis not present

## 2016-01-03 DIAGNOSIS — Z452 Encounter for adjustment and management of vascular access device: Secondary | ICD-10-CM | POA: Diagnosis not present

## 2016-01-03 DIAGNOSIS — E1165 Type 2 diabetes mellitus with hyperglycemia: Secondary | ICD-10-CM | POA: Diagnosis not present

## 2016-01-03 DIAGNOSIS — T814XXA Infection following a procedure, initial encounter: Secondary | ICD-10-CM | POA: Diagnosis not present

## 2016-01-07 ENCOUNTER — Other Ambulatory Visit: Payer: Self-pay | Admitting: Family Medicine

## 2016-01-08 ENCOUNTER — Other Ambulatory Visit: Payer: Self-pay | Admitting: Family Medicine

## 2016-01-08 DIAGNOSIS — Z452 Encounter for adjustment and management of vascular access device: Secondary | ICD-10-CM | POA: Diagnosis not present

## 2016-01-08 DIAGNOSIS — T814XXA Infection following a procedure, initial encounter: Secondary | ICD-10-CM | POA: Diagnosis not present

## 2016-01-08 DIAGNOSIS — G894 Chronic pain syndrome: Secondary | ICD-10-CM | POA: Diagnosis not present

## 2016-01-08 DIAGNOSIS — E1165 Type 2 diabetes mellitus with hyperglycemia: Secondary | ICD-10-CM | POA: Diagnosis not present

## 2016-01-08 DIAGNOSIS — J151 Pneumonia due to Pseudomonas: Secondary | ICD-10-CM | POA: Diagnosis not present

## 2016-01-08 DIAGNOSIS — N10 Acute tubulo-interstitial nephritis: Secondary | ICD-10-CM | POA: Diagnosis not present

## 2016-01-10 ENCOUNTER — Ambulatory Visit: Payer: Medicare Other | Attending: Pain Medicine | Admitting: Pain Medicine

## 2016-01-10 ENCOUNTER — Encounter: Payer: Self-pay | Admitting: Pain Medicine

## 2016-01-10 VITALS — BP 120/48 | HR 95 | Temp 98.6°F | Resp 16 | Ht 72.0 in | Wt 280.0 lb

## 2016-01-10 DIAGNOSIS — M5134 Other intervertebral disc degeneration, thoracic region: Secondary | ICD-10-CM | POA: Diagnosis not present

## 2016-01-10 DIAGNOSIS — M5126 Other intervertebral disc displacement, lumbar region: Secondary | ICD-10-CM | POA: Diagnosis not present

## 2016-01-10 DIAGNOSIS — N200 Calculus of kidney: Secondary | ICD-10-CM | POA: Insufficient documentation

## 2016-01-10 DIAGNOSIS — M791 Myalgia: Secondary | ICD-10-CM | POA: Diagnosis not present

## 2016-01-10 DIAGNOSIS — G588 Other specified mononeuropathies: Secondary | ICD-10-CM | POA: Insufficient documentation

## 2016-01-10 DIAGNOSIS — M5136 Other intervertebral disc degeneration, lumbar region: Secondary | ICD-10-CM | POA: Diagnosis not present

## 2016-01-10 DIAGNOSIS — M2578 Osteophyte, vertebrae: Secondary | ICD-10-CM | POA: Insufficient documentation

## 2016-01-10 DIAGNOSIS — G546 Phantom limb syndrome with pain: Secondary | ICD-10-CM

## 2016-01-10 DIAGNOSIS — M5124 Other intervertebral disc displacement, thoracic region: Secondary | ICD-10-CM | POA: Insufficient documentation

## 2016-01-10 DIAGNOSIS — M533 Sacrococcygeal disorders, not elsewhere classified: Secondary | ICD-10-CM | POA: Diagnosis not present

## 2016-01-10 DIAGNOSIS — Z9889 Other specified postprocedural states: Secondary | ICD-10-CM | POA: Diagnosis not present

## 2016-01-10 DIAGNOSIS — M79606 Pain in leg, unspecified: Secondary | ICD-10-CM | POA: Diagnosis present

## 2016-01-10 DIAGNOSIS — N12 Tubulo-interstitial nephritis, not specified as acute or chronic: Secondary | ICD-10-CM | POA: Insufficient documentation

## 2016-01-10 DIAGNOSIS — M19011 Primary osteoarthritis, right shoulder: Secondary | ICD-10-CM

## 2016-01-10 DIAGNOSIS — M545 Low back pain: Secondary | ICD-10-CM | POA: Diagnosis present

## 2016-01-10 DIAGNOSIS — M48062 Spinal stenosis, lumbar region with neurogenic claudication: Secondary | ICD-10-CM

## 2016-01-10 DIAGNOSIS — Z0000 Encounter for general adult medical examination without abnormal findings: Secondary | ICD-10-CM | POA: Diagnosis not present

## 2016-01-10 DIAGNOSIS — M4806 Spinal stenosis, lumbar region: Secondary | ICD-10-CM | POA: Insufficient documentation

## 2016-01-10 DIAGNOSIS — M47817 Spondylosis without myelopathy or radiculopathy, lumbosacral region: Secondary | ICD-10-CM | POA: Diagnosis not present

## 2016-01-10 DIAGNOSIS — Z89511 Acquired absence of right leg below knee: Secondary | ICD-10-CM

## 2016-01-10 DIAGNOSIS — M5127 Other intervertebral disc displacement, lumbosacral region: Secondary | ICD-10-CM | POA: Insufficient documentation

## 2016-01-10 DIAGNOSIS — M5416 Radiculopathy, lumbar region: Secondary | ICD-10-CM | POA: Diagnosis not present

## 2016-01-10 NOTE — Progress Notes (Deleted)
  Safety precautions to be maintained throughout the outpatient stay will include: orient to surroundings, keep bed in low position, maintain call bell within reach at all times, provide assistance with transfer out of bed and ambulation.  *** Stents are to be removed today 01/10/2016. Subjective:    Patient ID: [name], male    DOB: 1951-04-26, 65 y.o.   MRN: [id number]  HPI    Review of Systems     Objective:   Physical Exam        Assessment & Plan:
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
## 2016-01-10 NOTE — Patient Instructions (Addendum)
PLAN   Continue present medication oxycodone. PLEASE NOTE that you have a 15 mg oxycodone which should be seen-year-old and only opened for extreme pain. DO NOT confuse with the 5 mg size oxycodone.  F/U PCP Dr. Carlota Raspberry for evaliation of  BP and general medical  condition.   F/U surgical evaluation. May consider further evaluation pending follow-up evaluations  F/U cardiologist as discussed  F/U neurological evaluation for questionable TIA versus CVA  May consider PNCV/EMG studies and other studies pending follow-up evaluations  F/U urological evaluation status post urological surgery as discussed  May consider radiofrequency rhizolysis or intraspinal procedures pending response to present treatment and F/U evaluation.. We will avoid such procedures at this time as well as other aspects of treatment regimen  The patient will continue under the care of Dr. Carlota Raspberry his primary care physician as well as his urologist at this time.  The patient is to call pain management prior to scheduled return appointment for any concerns regarding condition

## 2016-01-15 ENCOUNTER — Telehealth: Payer: Self-pay | Admitting: Family Medicine

## 2016-01-16 ENCOUNTER — Encounter: Payer: Self-pay | Admitting: Family Medicine

## 2016-01-16 ENCOUNTER — Ambulatory Visit (INDEPENDENT_AMBULATORY_CARE_PROVIDER_SITE_OTHER): Payer: Medicare Other | Admitting: Family Medicine

## 2016-01-16 VITALS — BP 130/60 | HR 83 | Temp 98.1°F | Resp 16 | Ht 73.0 in | Wt 270.0 lb

## 2016-01-16 DIAGNOSIS — Z114 Encounter for screening for human immunodeficiency virus [HIV]: Secondary | ICD-10-CM | POA: Diagnosis not present

## 2016-01-16 DIAGNOSIS — Z0000 Encounter for general adult medical examination without abnormal findings: Secondary | ICD-10-CM | POA: Diagnosis not present

## 2016-01-16 DIAGNOSIS — K219 Gastro-esophageal reflux disease without esophagitis: Secondary | ICD-10-CM | POA: Diagnosis not present

## 2016-01-16 DIAGNOSIS — J151 Pneumonia due to Pseudomonas: Secondary | ICD-10-CM | POA: Diagnosis not present

## 2016-01-16 DIAGNOSIS — F339 Major depressive disorder, recurrent, unspecified: Secondary | ICD-10-CM

## 2016-01-16 DIAGNOSIS — Z961 Presence of intraocular lens: Secondary | ICD-10-CM | POA: Diagnosis not present

## 2016-01-16 DIAGNOSIS — Z1159 Encounter for screening for other viral diseases: Secondary | ICD-10-CM

## 2016-01-16 DIAGNOSIS — F329 Major depressive disorder, single episode, unspecified: Secondary | ICD-10-CM | POA: Diagnosis not present

## 2016-01-16 DIAGNOSIS — E1165 Type 2 diabetes mellitus with hyperglycemia: Secondary | ICD-10-CM

## 2016-01-16 DIAGNOSIS — E162 Hypoglycemia, unspecified: Secondary | ICD-10-CM | POA: Diagnosis not present

## 2016-01-16 DIAGNOSIS — IMO0001 Reserved for inherently not codable concepts without codable children: Secondary | ICD-10-CM

## 2016-01-16 DIAGNOSIS — N200 Calculus of kidney: Secondary | ICD-10-CM | POA: Diagnosis not present

## 2016-01-16 DIAGNOSIS — G459 Transient cerebral ischemic attack, unspecified: Secondary | ICD-10-CM | POA: Diagnosis not present

## 2016-01-16 DIAGNOSIS — R05 Cough: Secondary | ICD-10-CM

## 2016-01-16 DIAGNOSIS — E083213 Diabetes mellitus due to underlying condition with mild nonproliferative diabetic retinopathy with macular edema, bilateral: Secondary | ICD-10-CM | POA: Diagnosis not present

## 2016-01-16 DIAGNOSIS — R059 Cough, unspecified: Secondary | ICD-10-CM

## 2016-01-16 DIAGNOSIS — N12 Tubulo-interstitial nephritis, not specified as acute or chronic: Secondary | ICD-10-CM

## 2016-01-16 DIAGNOSIS — I10 Essential (primary) hypertension: Secondary | ICD-10-CM | POA: Diagnosis not present

## 2016-01-16 LAB — BASIC METABOLIC PANEL
BUN: 8 mg/dL (ref 7–25)
CHLORIDE: 101 mmol/L (ref 98–110)
CO2: 24 mmol/L (ref 20–31)
Calcium: 10.1 mg/dL (ref 8.6–10.3)
Creat: 0.88 mg/dL (ref 0.70–1.25)
Glucose, Bld: 304 mg/dL — ABNORMAL HIGH (ref 65–99)
POTASSIUM: 3.9 mmol/L (ref 3.5–5.3)
SODIUM: 135 mmol/L (ref 135–146)

## 2016-01-16 LAB — HM HEPATITIS C SCREENING LAB: HM Hepatitis Screen: NEGATIVE

## 2016-01-16 NOTE — Patient Instructions (Addendum)
IF you received an x-ray today, you will receive an invoice from [facility]. Please contact [facility] at [phone] with questions or concerns regarding your invoice.   IF you received labwork today, you will receive an invoice from [employer]. Please contact [facility] at [phone] with questions or concerns regarding your invoice.   Our billing staff will not be able to assist you with questions regarding bills from these companies.  You will be contacted with the lab results as soon as they are available. The fastest way to get your results is to activate your My Chart account. Instructions are located on the last page of this paperwork. If you have not heard from [place] regarding the results in 2 weeks, please contact this office.    With your TIA, the neurologist recommended starting Plavix once per day when ok with your urologist. Ask him next week and if ok to start - let me know and I can order that Plavix. For now continue aspirin each day,  If the cough does not continue to improve, any fever or shortness of breath - be seen here or emergency room right away.   I am concerned about your recent low blood sugars. Make sure you are eating regular meals and call your endocrinologist today to see if they want to change any medications or see you sooner than next scheduled visit based on these episodes.Return to the clinic or go to the nearest emergency room if any of your symptoms worsen or new symptoms occur.  See advanced directives information. Let me know if there are any questions and bring to [place] when completed to scan into your chart.   Check your meds - you likely need to be on the simvastatin with diabetes and recent TIA.  If you are not taking this let me know.  Follow up in next 3 months.   Return to the clinic or go to the nearest emergency room if any of your symptoms worsen or new symptoms  occur.     Hypoglycemia Hypoglycemia occurs when the glucose in your blood is too low. Glucose is a type of sugar that is your body's main energy source. Hormones, such as insulin and glucagon, control the level of glucose in the blood. Insulin lowers blood glucose and glucagon increases blood glucose. Having too much insulin in your blood stream, or not eating enough food containing sugar, can result in hypoglycemia. Hypoglycemia can happen to people with or without diabetes. It can develop quickly and can be a medical emergency.  CAUSES  1. Missing or delaying meals. 2. Not eating enough carbohydrates at meals. 3. Taking too much diabetes medicine. 4. Not timing your oral diabetes medicine or insulin doses with meals, snacks, and exercise. 5. Nausea and vomiting. 6. Certain medicines. 7. Severe illnesses, such as hepatitis, kidney disorders, and certain eating disorders. 8. Increased activity or exercise without eating something extra or adjusting medicines. 9. Drinking too much alcohol. 10. A nerve disorder that affects body functions like your heart rate, blood pressure, and digestion (autonomic neuropathy). 11. A condition where the stomach muscles do not function properly (gastroparesis). Therefore, medicines and food may not absorb properly. 12. Rarely, a tumor of the pancreas can produce too much insulin. SYMPTOMS  1. Hunger. 2. Sweating (diaphoresis). 3. Change in body temperature. 4. Shakiness. 5. Headache. 6. Anxiety. 7. Lightheadedness. 8. Irritability. 9. Difficulty concentrating. 10. Dry mouth. 11. Tingling or numbness in the hands or feet. 12.  Restless sleep or sleep disturbances. 13. Altered speech and coordination. 14. Change in mental status. 15. Seizures or prolonged convulsions. 16. Combativeness. 17. Drowsiness (lethargic). 18. Weakness. 19. Increased heart rate or palpitations. 20. Confusion. 21. Pale, gray skin color. 22. Blurred or double  vision. 23. Fainting. DIAGNOSIS  A physical exam and medical history will be performed. Your caregiver may make a diagnosis based on your symptoms. Blood tests and other lab tests may be performed to confirm a diagnosis. Once the diagnosis is made, your caregiver will see if your signs and symptoms go away once your blood glucose is raised.  TREATMENT  Usually, you can easily treat your hypoglycemia when you notice symptoms. 1. Check your blood glucose. If it is less than 70 mg/dl, take one of the following:  1. 3-4 glucose tablets.  2.  cup juice.  3.  cup regular soda.  4. 1 cup skim milk.  5. -1 tube of glucose gel.  6. 5-6 hard candies.  2. Avoid high-fat drinks or food that may delay a rise in blood glucose levels. 3. Do not take more than the recommended amount of sugary foods, drinks, gel, or tablets. Doing so will cause your blood glucose to go too high.  4. Wait 10-15 minutes and recheck your blood glucose. If it is still less than 70 mg/dl or below your target range, repeat treatment.  5. Eat a snack if it is more than 1 hour until your next meal.  There may be a time when your blood glucose may go so low that you are unable to treat yourself at home when you start to notice symptoms. You may need someone to help you. You may even faint or be unable to swallow. If you cannot treat yourself, someone will need to bring you to the hospital.  [place]  If you have diabetes, follow your diabetes management plan by:  Taking your medicines as directed.  Following your exercise plan.  Following your meal plan. Do not skip meals. Eat on time.  Testing your blood glucose regularly. Check your blood glucose before and after exercise. If you exercise longer or different than usual, be sure to check blood glucose more frequently.  Wearing your medical alert jewelry that says you have diabetes.  Identify the cause of your hypoglycemia. Then, develop ways to  prevent the recurrence of hypoglycemia.  Do not take a hot bath or shower right after an insulin shot.  Always carry treatment with you. Glucose tablets are the easiest to carry.  If you are going to drink alcohol, drink it only with meals.  Tell friends or family members ways to keep you safe during a seizure. This may include removing hard or sharp objects from the area or turning you on your side.  Maintain a healthy weight. SEEK MEDICAL CARE IF:   You are having problems keeping your blood glucose in your target range.  You are having frequent episodes of hypoglycemia.  You feel you might be having side effects from your medicines.  You are not sure why your blood glucose is dropping so low.  You notice a change in vision or a new problem with your vision. SEEK IMMEDIATE MEDICAL CARE IF:   Confusion develops.  A change in mental status occurs.  The inability to swallow develops.  Fainting occurs.   This information is not intended to replace advice given to you by your health care provider. Make sure you discuss any questions you have  with your health care provider.   Document Released: 09/01/2005 Document Revised: 09/06/2013 Document Reviewed: 05/08/2015 Elsevier Interactive Patient Education 2016 [place] you healthy  Get these tests  Blood pressure- Have your blood pressure checked once a year by your healthcare provider.  Normal blood pressure is 120/80  Weight- Have your body mass index (BMI) calculated to screen for obesity.  BMI is a measure of body fat based on height and weight. You can also calculate your own BMI at [email or web address].  Cholesterol- Have your cholesterol checked every year.  Diabetes- Have your blood sugar checked regularly if you have high blood pressure, high cholesterol, have a family history of diabetes or if you are overweight.  Screening for Colon Cancer- Colonoscopy starting at age 41.  Screening may begin  sooner depending on your family history and other health conditions. Follow up colonoscopy as directed by your Gastroenterologist.  Screening for Prostate Cancer- Both blood work (PSA) and a rectal exam help screen for Prostate Cancer.  Screening begins at age 18 with African-American men and at age 40 with Caucasian men.  Screening may begin sooner depending on your family history.  Take these medicines  Aspirin- One aspirin daily can help prevent Heart disease and Stroke.  Flu shot- Every fall.  Tetanus- Every 10 years.  Zostavax- Once after the age of 57 to prevent Shingles.  Pneumonia shot- Once after the age of 44; if you are younger than 70, ask your healthcare provider if you need a Pneumonia shot.  Take these steps  Don't smoke- If you do smoke, talk to your doctor about quitting.  For tips on how to quit, go to www.smokefree.gov or call 1-800-QUIT-NOW.  Be physically active- Exercise 5 days a week for at least 30 minutes.  If you are not already physically active start slow and gradually work up to 30 minutes of moderate physical activity.  Examples of moderate activity include walking briskly, mowing the yard, dancing, swimming, bicycling, etc.  Eat a healthy diet- Eat a variety of healthy food such as fruits, vegetables, low fat milk, low fat cheese, yogurt, lean meant, poultry, fish, beans, tofu, etc. For more information go to www.thenutritionsource.org  Drink alcohol in moderation- Limit alcohol intake to less than two drinks a day. Never drink and drive.  Dentist- Brush and floss twice daily; visit your dentist twice a year.  Depression- Your emotional health is as important as your physical health. If you're feeling down, or losing interest in things you would normally enjoy please talk to your healthcare provider.  Eye exam- Visit your eye doctor every year.  Safe sex- If you may be exposed to a sexually transmitted infection, use a condom.  Seat belts- Seat belts  can save your life; always wear one.  Smoke/Carbon Monoxide detectors- These detectors need to be installed on the appropriate level of your home.  Replace batteries at least once a year.  Skin cancer- When out in the sun, cover up and use sunscreen 15 SPF or higher.  Violence- If anyone is threatening you, please tell your healthcare provider. Living Will/ Health care power of attorney- Speak with your healthcare provider and family.

## 2016-01-16 NOTE — Progress Notes (Signed)
   Subjective:    Patient ID: [name], male    DOB: 1951-01-30, 65 y.o.   MRN: [id number]  HPI    Review of Systems  HENT: Positive for rhinorrhea, tinnitus and trouble swallowing.   Eyes: Negative.   Respiratory: Negative.   Cardiovascular: Negative.   Gastrointestinal: Positive for vomiting, abdominal pain and constipation.  Endocrine: Negative.   Genitourinary: Negative.   Musculoskeletal: Positive for myalgias, back pain and arthralgias.  Skin: Negative.   Allergic/Immunologic: Negative.   Neurological: Positive for dizziness and weakness.  Hematological: Bruises/bleeds easily.  Psychiatric/Behavioral: Negative.        Objective:   Physical Exam        Assessment & Plan:

## 2016-01-16 NOTE — Telephone Encounter (Signed)
Refilled

## 2016-01-16 NOTE — Progress Notes (Signed)
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 Gastroenterologist.  Screening for Prostate Cancer- Both blood work (PSA) and a rectal exam help screen for Prostate Cancer.  Screening begins at age 23 with African-American men and at age 46 with Caucasian men.  Screening may begin sooner depending on your family history.  Take these medicines  Aspirin- One aspirin daily can help prevent Heart disease and Stroke.  Flu shot- Every fall.  Tetanus- Every 10 years.  Zostavax- Once after the age of 9 to prevent Shingles.  Pneumonia shot- Once after the age of 53; if you are younger than 6, ask your healthcare provider if you need a Pneumonia shot.  Take these steps  Don't smoke- If you do smoke, talk to your doctor about quitting.  For tips on how to quit, go to www.smokefree.gov or call 1-800-QUIT-NOW.  Be physically active- Exercise 5 days a  week for at least 30 minutes.  If you are not already physically active start slow and gradually work up to 30 minutes of moderate physical activity.  Examples of moderate activity include walking briskly, mowing the yard, dancing, swimming, bicycling, etc.  Eat a healthy diet- Eat a variety of healthy food such as fruits, vegetables, low fat milk, low fat cheese, yogurt, lean meant, poultry, fish, beans, tofu, etc. For more information go to www.thenutritionsource.org  Drink alcohol in moderation- Limit alcohol intake to less than two drinks a day. Never drink and drive.  Dentist- Brush and floss twice daily; visit your dentist twice a year.  Depression- Your emotional health is as important as your physical health. If you're feeling down, or losing interest in things you would normally enjoy please talk to your healthcare provider.  Eye exam- Visit your eye doctor every year.  Safe sex- If you may be exposed to a sexually transmitted infection, use a condom.  Seat belts- Seat belts can save your life; always wear one.  Smoke/Carbon Monoxide detectors- These detectors need to be installed on the appropriate level of your home.  Replace batteries at least once a year.  Skin cancer- When out in the sun, cover up and use sunscreen 15 SPF or higher.  Violence- If anyone is threatening you, please tell your healthcare provider. Living Will/ Health care power of attorney- Speak with your healthcare provider and family.    I personally performed the services described in this documentation, which was scribed in my presence. The recorded information has been reviewed and considered, and addended by me as needed.

## 2016-01-16 NOTE — Telephone Encounter (Signed)
Patient had an appointment today.  This hasn't been fill since 9/16.

## 2016-01-17 LAB — HEPATITIS C ANTIBODY: HCV AB: NEGATIVE

## 2016-01-17 LAB — HIV ANTIBODY (ROUTINE TESTING W REFLEX): HIV 1&2 Ab, 4th Generation: NONREACTIVE

## 2016-01-22 ENCOUNTER — Other Ambulatory Visit: Payer: Self-pay | Admitting: Family Medicine

## 2016-01-28 ENCOUNTER — Inpatient Hospital Stay (HOSPITAL_COMMUNITY)
Admission: EM | Admit: 2016-01-28 | Discharge: 2016-02-02 | DRG: 872 | Disposition: A | Discharge status: Home Health | Payer: Medicare Other | Attending: Internal Medicine | Admitting: Internal Medicine

## 2016-01-28 ENCOUNTER — Encounter (HOSPITAL_COMMUNITY): Payer: Self-pay | Admitting: Emergency Medicine

## 2016-01-28 DIAGNOSIS — Z8614 Personal history of Methicillin resistant Staphylococcus aureus infection: Secondary | ICD-10-CM

## 2016-01-28 DIAGNOSIS — I2510 Atherosclerotic heart disease of native coronary artery without angina pectoris: Secondary | ICD-10-CM | POA: Diagnosis present

## 2016-01-28 DIAGNOSIS — E876 Hypokalemia: Secondary | ICD-10-CM | POA: Diagnosis not present

## 2016-01-28 DIAGNOSIS — Z79891 Long term (current) use of opiate analgesic: Secondary | ICD-10-CM

## 2016-01-28 DIAGNOSIS — D72819 Decreased white blood cell count, unspecified: Secondary | ICD-10-CM | POA: Diagnosis present

## 2016-01-28 DIAGNOSIS — A419 Sepsis, unspecified organism: Secondary | ICD-10-CM | POA: Diagnosis not present

## 2016-01-28 DIAGNOSIS — F32A Depression, unspecified: Secondary | ICD-10-CM | POA: Diagnosis present

## 2016-01-28 DIAGNOSIS — E871 Hypo-osmolality and hyponatremia: Secondary | ICD-10-CM | POA: Diagnosis not present

## 2016-01-28 DIAGNOSIS — G4730 Sleep apnea, unspecified: Secondary | ICD-10-CM | POA: Diagnosis present

## 2016-01-28 DIAGNOSIS — N12 Tubulo-interstitial nephritis, not specified as acute or chronic: Secondary | ICD-10-CM | POA: Diagnosis not present

## 2016-01-28 DIAGNOSIS — Z8673 Personal history of transient ischemic attack (TIA), and cerebral infarction without residual deficits: Secondary | ICD-10-CM

## 2016-01-28 DIAGNOSIS — E1165 Type 2 diabetes mellitus with hyperglycemia: Secondary | ICD-10-CM

## 2016-01-28 DIAGNOSIS — D6489 Other specified anemias: Secondary | ICD-10-CM | POA: Diagnosis present

## 2016-01-28 DIAGNOSIS — Z87440 Personal history of urinary (tract) infections: Secondary | ICD-10-CM

## 2016-01-28 DIAGNOSIS — Z888 Allergy status to other drugs, medicaments and biological substances status: Secondary | ICD-10-CM

## 2016-01-28 DIAGNOSIS — Z885 Allergy status to narcotic agent status: Secondary | ICD-10-CM

## 2016-01-28 DIAGNOSIS — Z903 Acquired absence of stomach [part of]: Secondary | ICD-10-CM

## 2016-01-28 DIAGNOSIS — E1169 Type 2 diabetes mellitus with other specified complication: Secondary | ICD-10-CM | POA: Diagnosis present

## 2016-01-28 DIAGNOSIS — R509 Fever, unspecified: Secondary | ICD-10-CM | POA: Diagnosis not present

## 2016-01-28 DIAGNOSIS — I10 Essential (primary) hypertension: Secondary | ICD-10-CM | POA: Diagnosis present

## 2016-01-28 DIAGNOSIS — E1065 Type 1 diabetes mellitus with hyperglycemia: Secondary | ICD-10-CM | POA: Diagnosis present

## 2016-01-28 DIAGNOSIS — R1111 Vomiting without nausea: Secondary | ICD-10-CM | POA: Diagnosis not present

## 2016-01-28 DIAGNOSIS — G894 Chronic pain syndrome: Secondary | ICD-10-CM | POA: Diagnosis present

## 2016-01-28 DIAGNOSIS — Z87891 Personal history of nicotine dependence: Secondary | ICD-10-CM

## 2016-01-28 DIAGNOSIS — D649 Anemia, unspecified: Secondary | ICD-10-CM

## 2016-01-28 DIAGNOSIS — Z1612 Extended spectrum beta lactamase (ESBL) resistance: Secondary | ICD-10-CM | POA: Diagnosis present

## 2016-01-28 DIAGNOSIS — Z87442 Personal history of urinary calculi: Secondary | ICD-10-CM

## 2016-01-28 DIAGNOSIS — Z79899 Other long term (current) drug therapy: Secondary | ICD-10-CM

## 2016-01-28 DIAGNOSIS — K449 Diaphragmatic hernia without obstruction or gangrene: Secondary | ICD-10-CM | POA: Diagnosis present

## 2016-01-28 DIAGNOSIS — B9620 Unspecified Escherichia coli [E. coli] as the cause of diseases classified elsewhere: Secondary | ICD-10-CM | POA: Diagnosis present

## 2016-01-28 DIAGNOSIS — F419 Anxiety disorder, unspecified: Secondary | ICD-10-CM | POA: Diagnosis present

## 2016-01-28 DIAGNOSIS — Z825 Family history of asthma and other chronic lower respiratory diseases: Secondary | ICD-10-CM

## 2016-01-28 DIAGNOSIS — E7800 Pure hypercholesterolemia, unspecified: Secondary | ICD-10-CM | POA: Diagnosis present

## 2016-01-28 DIAGNOSIS — E1139 Type 2 diabetes mellitus with other diabetic ophthalmic complication: Secondary | ICD-10-CM

## 2016-01-28 DIAGNOSIS — F329 Major depressive disorder, single episode, unspecified: Secondary | ICD-10-CM | POA: Diagnosis not present

## 2016-01-28 DIAGNOSIS — Z794 Long term (current) use of insulin: Secondary | ICD-10-CM

## 2016-01-28 DIAGNOSIS — Z833 Family history of diabetes mellitus: Secondary | ICD-10-CM

## 2016-01-28 DIAGNOSIS — IMO0002 Reserved for concepts with insufficient information to code with codable children: Secondary | ICD-10-CM

## 2016-01-28 DIAGNOSIS — R000 Tachycardia, unspecified: Secondary | ICD-10-CM | POA: Diagnosis not present

## 2016-01-28 DIAGNOSIS — E785 Hyperlipidemia, unspecified: Secondary | ICD-10-CM | POA: Diagnosis present

## 2016-01-28 DIAGNOSIS — K219 Gastro-esophageal reflux disease without esophagitis: Secondary | ICD-10-CM

## 2016-01-28 DIAGNOSIS — E210 Primary hyperparathyroidism: Secondary | ICD-10-CM | POA: Diagnosis present

## 2016-01-28 DIAGNOSIS — Z7982 Long term (current) use of aspirin: Secondary | ICD-10-CM

## 2016-01-28 DIAGNOSIS — E669 Obesity, unspecified: Secondary | ICD-10-CM

## 2016-01-28 DIAGNOSIS — Z9109 Other allergy status, other than to drugs and biological substances: Secondary | ICD-10-CM

## 2016-01-28 DIAGNOSIS — Z8249 Family history of ischemic heart disease and other diseases of the circulatory system: Secondary | ICD-10-CM

## 2016-01-28 DIAGNOSIS — Z89511 Acquired absence of right leg below knee: Secondary | ICD-10-CM

## 2016-01-28 DIAGNOSIS — N529 Male erectile dysfunction, unspecified: Secondary | ICD-10-CM | POA: Diagnosis present

## 2016-01-28 LAB — COMPREHENSIVE METABOLIC PANEL
ALT: 25 U/L (ref 17–63)
AST: 39 U/L (ref 15–41)
Albumin: 3 g/dL — ABNORMAL LOW (ref 3.5–5.0)
Alkaline Phosphatase: 118 U/L (ref 38–126)
Anion gap: 4 — ABNORMAL LOW (ref 5–15)
BILIRUBIN TOTAL: 1.2 mg/dL (ref 0.3–1.2)
BUN: 9 mg/dL (ref 6–20)
CO2: 23 mmol/L (ref 22–32)
CREATININE: 0.67 mg/dL (ref 0.61–1.24)
Calcium: 8.9 mg/dL (ref 8.9–10.3)
Chloride: 102 mmol/L (ref 101–111)
Glucose, Bld: 99 mg/dL (ref 65–99)
Potassium: 3.2 mmol/L — ABNORMAL LOW (ref 3.5–5.1)
Sodium: 129 mmol/L — ABNORMAL LOW (ref 135–145)
TOTAL PROTEIN: 6.1 g/dL — AB (ref 6.5–8.1)

## 2016-01-28 LAB — CBC
HCT: 32.6 % — ABNORMAL LOW (ref 39.0–52.0)
Hemoglobin: 11 g/dL — ABNORMAL LOW (ref 13.0–17.0)
MCH: 33.1 pg (ref 26.0–34.0)
MCHC: 33.7 g/dL (ref 30.0–36.0)
MCV: 98.2 fL (ref 78.0–100.0)
PLATELETS: 265 10*3/uL (ref 150–400)
RBC: 3.32 MIL/uL — ABNORMAL LOW (ref 4.22–5.81)
RDW: 15.6 % — AB (ref 11.5–15.5)
WBC: 3.9 10*3/uL — AB (ref 4.0–10.5)

## 2016-01-28 LAB — URINE MICROSCOPIC-ADD ON: SQUAMOUS EPITHELIAL / LPF: NONE SEEN

## 2016-01-28 LAB — URINALYSIS, ROUTINE W REFLEX MICROSCOPIC
Bilirubin Urine: NEGATIVE
Glucose, UA: 100 mg/dL — AB
KETONES UR: NEGATIVE mg/dL
NITRITE: NEGATIVE
PROTEIN: NEGATIVE mg/dL
Specific Gravity, Urine: 1.018 (ref 1.005–1.030)
pH: 6.5 (ref 5.0–8.0)

## 2016-01-28 LAB — LIPASE, BLOOD: Lipase: <10 U/L — ABNORMAL LOW (ref 11–51)

## 2016-01-28 NOTE — ED Notes (Signed)
Pt comes to Ed via Ems, c/o NVD, started 3 days ago. Pt reported a fever on and off. Pt comes from home, wife otw.  20 in left wrist/ hand. 8 mg zofran given by ems. V/s on arrival 150/76, pulse 120, 02 sat 96 room air, cbg 159.  Right leg below knee amputation.

## 2016-01-28 NOTE — ED Notes (Signed)
Bed: [id number] Expected date:  Expected time:  Means of arrival:  Comments: EMS 65yo M N/V/D x 3 days
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
## 2016-01-29 ENCOUNTER — Encounter (HOSPITAL_COMMUNITY): Payer: Self-pay | Admitting: Internal Medicine

## 2016-01-29 ENCOUNTER — Other Ambulatory Visit: Payer: Self-pay

## 2016-01-29 DIAGNOSIS — Z79891 Long term (current) use of opiate analgesic: Secondary | ICD-10-CM | POA: Diagnosis not present

## 2016-01-29 DIAGNOSIS — F329 Major depressive disorder, single episode, unspecified: Secondary | ICD-10-CM | POA: Diagnosis present

## 2016-01-29 DIAGNOSIS — Z888 Allergy status to other drugs, medicaments and biological substances status: Secondary | ICD-10-CM | POA: Diagnosis not present

## 2016-01-29 DIAGNOSIS — I2510 Atherosclerotic heart disease of native coronary artery without angina pectoris: Secondary | ICD-10-CM | POA: Diagnosis present

## 2016-01-29 DIAGNOSIS — Z87891 Personal history of nicotine dependence: Secondary | ICD-10-CM | POA: Diagnosis not present

## 2016-01-29 DIAGNOSIS — E876 Hypokalemia: Secondary | ICD-10-CM | POA: Diagnosis not present

## 2016-01-29 DIAGNOSIS — Z9109 Other allergy status, other than to drugs and biological substances: Secondary | ICD-10-CM | POA: Diagnosis not present

## 2016-01-29 DIAGNOSIS — R509 Fever, unspecified: Secondary | ICD-10-CM | POA: Diagnosis not present

## 2016-01-29 DIAGNOSIS — Z8673 Personal history of transient ischemic attack (TIA), and cerebral infarction without residual deficits: Secondary | ICD-10-CM | POA: Diagnosis not present

## 2016-01-29 DIAGNOSIS — N12 Tubulo-interstitial nephritis, not specified as acute or chronic: Secondary | ICD-10-CM | POA: Diagnosis not present

## 2016-01-29 DIAGNOSIS — Z7982 Long term (current) use of aspirin: Secondary | ICD-10-CM | POA: Diagnosis not present

## 2016-01-29 DIAGNOSIS — N200 Calculus of kidney: Secondary | ICD-10-CM | POA: Diagnosis not present

## 2016-01-29 DIAGNOSIS — Z8614 Personal history of Methicillin resistant Staphylococcus aureus infection: Secondary | ICD-10-CM | POA: Diagnosis not present

## 2016-01-29 DIAGNOSIS — K219 Gastro-esophageal reflux disease without esophagitis: Secondary | ICD-10-CM

## 2016-01-29 DIAGNOSIS — Z903 Acquired absence of stomach [part of]: Secondary | ICD-10-CM | POA: Diagnosis not present

## 2016-01-29 DIAGNOSIS — K449 Diaphragmatic hernia without obstruction or gangrene: Secondary | ICD-10-CM | POA: Diagnosis present

## 2016-01-29 DIAGNOSIS — G894 Chronic pain syndrome: Secondary | ICD-10-CM | POA: Diagnosis not present

## 2016-01-29 DIAGNOSIS — G4730 Sleep apnea, unspecified: Secondary | ICD-10-CM | POA: Diagnosis present

## 2016-01-29 DIAGNOSIS — Z885 Allergy status to narcotic agent status: Secondary | ICD-10-CM | POA: Diagnosis not present

## 2016-01-29 DIAGNOSIS — D6489 Other specified anemias: Secondary | ICD-10-CM | POA: Diagnosis present

## 2016-01-29 DIAGNOSIS — E7800 Pure hypercholesterolemia, unspecified: Secondary | ICD-10-CM | POA: Diagnosis present

## 2016-01-29 DIAGNOSIS — E785 Hyperlipidemia, unspecified: Secondary | ICD-10-CM | POA: Diagnosis present

## 2016-01-29 DIAGNOSIS — Z825 Family history of asthma and other chronic lower respiratory diseases: Secondary | ICD-10-CM | POA: Diagnosis not present

## 2016-01-29 DIAGNOSIS — A419 Sepsis, unspecified organism: Principal | ICD-10-CM

## 2016-01-29 DIAGNOSIS — Z794 Long term (current) use of insulin: Secondary | ICD-10-CM | POA: Diagnosis not present

## 2016-01-29 DIAGNOSIS — E871 Hypo-osmolality and hyponatremia: Secondary | ICD-10-CM | POA: Diagnosis present

## 2016-01-29 DIAGNOSIS — Z87440 Personal history of urinary (tract) infections: Secondary | ICD-10-CM | POA: Diagnosis not present

## 2016-01-29 DIAGNOSIS — Z79899 Other long term (current) drug therapy: Secondary | ICD-10-CM | POA: Diagnosis not present

## 2016-01-29 DIAGNOSIS — E210 Primary hyperparathyroidism: Secondary | ICD-10-CM | POA: Diagnosis present

## 2016-01-29 DIAGNOSIS — B9620 Unspecified Escherichia coli [E. coli] as the cause of diseases classified elsewhere: Secondary | ICD-10-CM | POA: Diagnosis present

## 2016-01-29 DIAGNOSIS — N390 Urinary tract infection, site not specified: Secondary | ICD-10-CM | POA: Diagnosis not present

## 2016-01-29 DIAGNOSIS — Z87442 Personal history of urinary calculi: Secondary | ICD-10-CM | POA: Diagnosis not present

## 2016-01-29 DIAGNOSIS — N529 Male erectile dysfunction, unspecified: Secondary | ICD-10-CM | POA: Diagnosis present

## 2016-01-29 DIAGNOSIS — Z89511 Acquired absence of right leg below knee: Secondary | ICD-10-CM | POA: Diagnosis not present

## 2016-01-29 DIAGNOSIS — F419 Anxiety disorder, unspecified: Secondary | ICD-10-CM | POA: Diagnosis present

## 2016-01-29 DIAGNOSIS — Z8249 Family history of ischemic heart disease and other diseases of the circulatory system: Secondary | ICD-10-CM | POA: Diagnosis not present

## 2016-01-29 DIAGNOSIS — I10 Essential (primary) hypertension: Secondary | ICD-10-CM | POA: Diagnosis not present

## 2016-01-29 DIAGNOSIS — E1139 Type 2 diabetes mellitus with other diabetic ophthalmic complication: Secondary | ICD-10-CM | POA: Diagnosis not present

## 2016-01-29 DIAGNOSIS — E1065 Type 1 diabetes mellitus with hyperglycemia: Secondary | ICD-10-CM | POA: Diagnosis present

## 2016-01-29 DIAGNOSIS — Z833 Family history of diabetes mellitus: Secondary | ICD-10-CM | POA: Diagnosis not present

## 2016-01-29 DIAGNOSIS — Z1612 Extended spectrum beta lactamase (ESBL) resistance: Secondary | ICD-10-CM | POA: Diagnosis present

## 2016-01-29 DIAGNOSIS — D72819 Decreased white blood cell count, unspecified: Secondary | ICD-10-CM | POA: Diagnosis present

## 2016-01-29 LAB — GLUCOSE, CAPILLARY
GLUCOSE-CAPILLARY: 62 mg/dL — AB (ref 65–99)
GLUCOSE-CAPILLARY: 67 mg/dL (ref 65–99)
GLUCOSE-CAPILLARY: 75 mg/dL (ref 65–99)
GLUCOSE-CAPILLARY: 81 mg/dL (ref 65–99)
GLUCOSE-CAPILLARY: 291 mg/dL — AB (ref 65–99)
Glucose-Capillary: 150 mg/dL — ABNORMAL HIGH (ref 65–99)
Glucose-Capillary: 237 mg/dL — ABNORMAL HIGH (ref 65–99)

## 2016-01-29 LAB — CBC
HCT: 29.3 % — ABNORMAL LOW (ref 39.0–52.0)
HEMOGLOBIN: 9.9 g/dL — AB (ref 13.0–17.0)
MCH: 33.3 pg (ref 26.0–34.0)
MCHC: 33.8 g/dL (ref 30.0–36.0)
MCV: 98.7 fL (ref 78.0–100.0)
PLATELETS: 245 10*3/uL (ref 150–400)
RBC: 2.97 MIL/uL — AB (ref 4.22–5.81)
RDW: 15.9 % — ABNORMAL HIGH (ref 11.5–15.5)
WBC: 3.6 10*3/uL — AB (ref 4.0–10.5)

## 2016-01-29 LAB — BASIC METABOLIC PANEL
ANION GAP: 5 (ref 5–15)
BUN: 8 mg/dL (ref 6–20)
CALCIUM: 8.7 mg/dL — AB (ref 8.9–10.3)
CO2: 22 mmol/L (ref 22–32)
CREATININE: 0.66 mg/dL (ref 0.61–1.24)
Chloride: 106 mmol/L (ref 101–111)
Glucose, Bld: 82 mg/dL (ref 65–99)
Potassium: 3.4 mmol/L — ABNORMAL LOW (ref 3.5–5.1)
SODIUM: 133 mmol/L — AB (ref 135–145)

## 2016-01-29 LAB — DIFFERENTIAL
Basophils Absolute: 0 10*3/uL (ref 0.0–0.1)
Basophils Relative: 0 %
Eosinophils Absolute: 0 10*3/uL (ref 0.0–0.7)
Eosinophils Relative: 0 %
Lymphocytes Relative: 28 %
Lymphs Abs: 1.1 10*3/uL (ref 0.7–4.0)
Monocytes Absolute: 0.4 10*3/uL (ref 0.1–1.0)
Monocytes Relative: 9 %
Neutro Abs: 2.3 10*3/uL (ref 1.7–7.7)
Neutrophils Relative %: 63 %

## 2016-01-29 LAB — PROTIME-INR
INR: 1.01 (ref 0.00–1.49)
PROTHROMBIN TIME: 13.5 s (ref 11.6–15.2)

## 2016-01-29 LAB — APTT: APTT: 39 s — AB (ref 24–37)

## 2016-01-29 LAB — MAGNESIUM: Magnesium: 1.9 mg/dL (ref 1.7–2.4)

## 2016-01-29 LAB — LACTIC ACID, PLASMA
LACTIC ACID, VENOUS: 1 mmol/L (ref 0.5–2.0)
Lactic Acid, Venous: 0.7 mmol/L (ref 0.5–2.0)

## 2016-01-29 LAB — I-STAT CG4 LACTIC ACID, ED: Lactic Acid, Venous: 1.64 mmol/L (ref 0.5–2.0)

## 2016-01-29 LAB — PROCALCITONIN: Procalcitonin: 1.43 ng/mL

## 2016-01-29 MED ORDER — SODIUM CHLORIDE 0.9 % IV SOLN
INTRAVENOUS | Status: DC
  Administered 2016-01-29 – 2016-01-31 (×3): via INTRAVENOUS

## 2016-01-29 MED ORDER — HYDRALAZINE HCL 20 MG/ML IJ SOLN: 5.0000 mg | INTRAMUSCULAR | Status: DC | PRN

## 2016-01-29 MED ORDER — PREGABALIN 50 MG PO CAPS
100.0000 mg | ORAL_CAPSULE | Freq: Three times a day (TID) | ORAL | Status: DC
  Administered 2016-01-29 – 2016-02-02 (×14): 100 mg via ORAL
  Filled 2016-01-29 (×14): qty 2

## 2016-01-29 MED ORDER — DULOXETINE HCL 60 MG PO CPEP
60.0000 mg | ORAL_CAPSULE | Freq: Every day | ORAL | Status: DC
  Administered 2016-01-29 – 2016-02-02 (×5): 60 mg via ORAL
  Filled 2016-01-29 (×6): qty 1

## 2016-01-29 MED ORDER — TRAZODONE HCL 50 MG PO TABS
50.0000 mg | ORAL_TABLET | Freq: Every day | ORAL | Status: DC
  Administered 2016-01-29 – 2016-02-01 (×5): 50 mg via ORAL
  Filled 2016-01-29 (×5): qty 1

## 2016-01-29 MED ORDER — ONDANSETRON HCL 4 MG/2ML IJ SOLN
4.0000 mg | Freq: Three times a day (TID) | INTRAMUSCULAR | Status: DC | PRN
  Administered 2016-01-29 – 2016-02-02 (×4): 4 mg via INTRAVENOUS
  Filled 2016-01-29 (×5): qty 2

## 2016-01-29 MED ORDER — SODIUM CHLORIDE 0.9 % IV SOLN: 1000.0000 mL | INTRAVENOUS | Status: DC

## 2016-01-29 MED ORDER — PROMETHAZINE HCL 25 MG PO TABS
25.0000 mg | ORAL_TABLET | Freq: Four times a day (QID) | ORAL | Status: DC | PRN
  Administered 2016-01-29 – 2016-01-30 (×4): 25 mg via ORAL
  Filled 2016-01-29 (×4): qty 1

## 2016-01-29 MED ORDER — DEXTROSE 5 % IV SOLN: 1.0000 g | Freq: Once | INTRAVENOUS | Status: DC

## 2016-01-29 MED ORDER — SODIUM CHLORIDE 0.9% FLUSH
3.0000 mL | Freq: Two times a day (BID) | INTRAVENOUS | Status: DC
  Administered 2016-01-29 – 2016-01-30 (×2): 3 mL via INTRAVENOUS

## 2016-01-29 MED ORDER — INSULIN ASPART 100 UNIT/ML ~~LOC~~ SOLN
0.0000 [IU] | Freq: Three times a day (TID) | SUBCUTANEOUS | Status: DC
  Administered 2016-01-29: 1 [IU] via SUBCUTANEOUS
  Administered 2016-01-29: 3 [IU] via SUBCUTANEOUS
  Administered 2016-01-30: 5 [IU] via SUBCUTANEOUS
  Administered 2016-01-30: 9 [IU] via SUBCUTANEOUS
  Administered 2016-01-30: 5 [IU] via SUBCUTANEOUS
  Administered 2016-01-31: 3 [IU] via SUBCUTANEOUS
  Administered 2016-01-31: 5 [IU] via SUBCUTANEOUS

## 2016-01-29 MED ORDER — ASPIRIN EC 81 MG PO TBEC
81.0000 mg | DELAYED_RELEASE_TABLET | Freq: Every morning | ORAL | Status: DC
  Administered 2016-01-29 – 2016-02-02 (×5): 81 mg via ORAL
  Filled 2016-01-29 (×6): qty 1

## 2016-01-29 MED ORDER — POTASSIUM CHLORIDE CRYS ER 20 MEQ PO TBCR
40.0000 meq | EXTENDED_RELEASE_TABLET | Freq: Once | ORAL | Status: DC
  Filled 2016-01-29: qty 2

## 2016-01-29 MED ORDER — ZOLPIDEM TARTRATE 10 MG PO TABS
10.0000 mg | ORAL_TABLET | Freq: Every evening | ORAL | Status: DC | PRN
Start: 2016-01-29 — End: 2016-02-02

## 2016-01-29 MED ORDER — ENOXAPARIN SODIUM 60 MG/0.6ML ~~LOC~~ SOLN
60.0000 mg | SUBCUTANEOUS | Status: DC
  Administered 2016-01-29 – 2016-02-01 (×4): 60 mg via SUBCUTANEOUS
  Filled 2016-01-29 (×6): qty 0.6

## 2016-01-29 MED ORDER — DEXTROSE 5 % IV SOLN
2.0000 g | Freq: Once | INTRAVENOUS | Status: AC
  Administered 2016-01-29: 2 g via INTRAVENOUS
  Filled 2016-01-29: qty 2

## 2016-01-29 MED ORDER — DEXTROSE 5 % IV SOLN
1.0000 g | Freq: Three times a day (TID) | INTRAVENOUS | Status: DC
  Administered 2016-01-29 – 2016-01-31 (×7): 1 g via INTRAVENOUS
  Filled 2016-01-29 (×8): qty 1

## 2016-01-29 MED ORDER — PANTOPRAZOLE SODIUM 40 MG PO TBEC
40.0000 mg | DELAYED_RELEASE_TABLET | Freq: Every day | ORAL | Status: DC
  Administered 2016-01-29 – 2016-01-30 (×2): 40 mg via ORAL
  Filled 2016-01-29 (×2): qty 1

## 2016-01-29 MED ORDER — ACETAMINOPHEN 325 MG PO TABS
650.0000 mg | ORAL_TABLET | Freq: Four times a day (QID) | ORAL | Status: DC | PRN
  Administered 2016-01-29 – 2016-01-31 (×3): 650 mg via ORAL
  Filled 2016-01-29 (×3): qty 2

## 2016-01-29 MED ORDER — LORAZEPAM 0.5 MG PO TABS
0.5000 mg | ORAL_TABLET | Freq: Every evening | ORAL | Status: DC | PRN
  Administered 2016-02-02: 1 mg via ORAL
  Filled 2016-01-29: qty 2

## 2016-01-29 MED ORDER — POTASSIUM CHLORIDE CRYS ER 20 MEQ PO TBCR
40.0000 meq | EXTENDED_RELEASE_TABLET | Freq: Once | ORAL | Status: AC
  Administered 2016-01-29: 40 meq via ORAL
  Filled 2016-01-29: qty 2

## 2016-01-29 MED ORDER — SODIUM CHLORIDE 0.9 % IV SOLN
1000.0000 mL | Freq: Once | INTRAVENOUS | Status: AC
Start: 2016-01-29 — End: 2016-01-29
  Administered 2016-01-29: 1000 mL via INTRAVENOUS

## 2016-01-29 MED ORDER — ONDANSETRON HCL 4 MG PO TABS
4.0000 mg | ORAL_TABLET | Freq: Three times a day (TID) | ORAL | Status: DC | PRN
  Administered 2016-01-29: 4 mg via ORAL
  Filled 2016-01-29: qty 1

## 2016-01-29 MED ORDER — INSULIN ASPART 100 UNIT/ML ~~LOC~~ SOLN
0.0000 [IU] | Freq: Every day | SUBCUTANEOUS | Status: DC
Start: 2016-01-29 — End: 2016-01-31
  Administered 2016-01-29 – 2016-01-30 (×2): 3 [IU] via SUBCUTANEOUS

## 2016-01-29 MED ORDER — DOCUSATE SODIUM 50 MG PO CAPS
250.0000 mg | ORAL_CAPSULE | Freq: Every day | ORAL | Status: DC | PRN
  Filled 2016-01-29: qty 1

## 2016-01-29 MED ORDER — SODIUM CHLORIDE 0.9 % IV BOLUS (SEPSIS)
1500.0000 mL | Freq: Once | INTRAVENOUS | Status: AC
  Administered 2016-01-29: 1500 mL via INTRAVENOUS

## 2016-01-29 MED ORDER — OXYCODONE HCL 5 MG PO TABS
15.0000 mg | ORAL_TABLET | Freq: Four times a day (QID) | ORAL | Status: DC
  Administered 2016-01-29 – 2016-02-02 (×17): 15 mg via ORAL
  Filled 2016-01-29 (×17): qty 3

## 2016-01-29 MED ORDER — ACETAMINOPHEN 650 MG RE SUPP: 650.0000 mg | Freq: Four times a day (QID) | RECTAL | Status: DC | PRN

## 2016-01-29 MED ORDER — OXYCODONE HCL 5 MG PO TABS
15.0000 mg | ORAL_TABLET | Freq: Four times a day (QID) | ORAL | Status: DC | PRN
  Administered 2016-01-29 (×2): 15 mg via ORAL
  Filled 2016-01-29 (×2): qty 3

## 2016-01-29 MED ORDER — ENOXAPARIN SODIUM 40 MG/0.4ML ~~LOC~~ SOLN
40.0000 mg | SUBCUTANEOUS | Status: DC
Start: 2016-01-29 — End: 2016-01-29
  Filled 2016-01-29: qty 0.4

## 2016-01-29 NOTE — Evaluation (Signed)
Physical Therapy Evaluation Patient Details Name: FILBERT ECKART MRN: [id number] DOB: 1950-12-31 Today's Date: 01/29/2016   History of Present Illness  65 yo male admitted with pyelonephritis. hx of R BKA, HTN, DM, CAD, PVD.   Clinical Impression  On eval, pt required Min assist for mobility-walked ~50 feet with RW. Pt c/o abd pain. Discussed d/c plan-per pt and wife, pt will return home. Discussed HHPT follow up-wife is agreeable, pt is hesitant to agree. Pt could benefit from HHPT for general strengthening and endurance training.     Follow Up Recommendations Home health PT;Supervision/Assistance - 24 hour (if pt is agreeable)    Equipment Recommendations  None recommended by PT    Recommendations for Other Services       Precautions / Restrictions Precautions Precautions: Fall Precaution Comments: CANNOT LIE FLAT Other Brace/Splint: R BKA prosthesis Restrictions Weight Bearing Restrictions: No      Mobility  Bed Mobility Overal bed mobility: Needs Assistance Bed Mobility: Supine to Sit     Supine to sit: Supervision Sit to supine: HOB elevated   General bed mobility comments: for safety. increased time.  Transfers Overall transfer level: Needs assistance Equipment used: Rolling walker (2 wheeled) Transfers: Sit to/from Stand Sit to Stand: From elevated surface         General transfer comment: Assist to rise, stabilize, control descent. VCs safety, hand placement  Ambulation/Gait Ambulation/Gait assistance: Min assist Ambulation Distance (Feet): 50 Feet Assistive device: Rolling walker (2 wheeled) Gait Pattern/deviations: Step-to pattern;Wide base of support;Trunk flexed     General Gait Details: Assist to stabilize pt and to maneuver safely with walker. VCs for posture, safe distance from RW. Pt fatigues fairly easily-requested to return to room after ~25 feet  Stairs            Wheelchair Mobility    Modified Rankin (Stroke Patients Only)        Balance Overall balance assessment: Needs assistance         Standing balance support: During functional activity Standing balance-Leahy Scale: Poor Standing balance comment: requires RW for support                             Pertinent Vitals/Pain Pain Assessment: Faces Faces Pain Scale: Hurts even more Pain Location: abdomen Pain Descriptors / Indicators: Grimacing Pain Intervention(s): Monitored during session;Repositioned    Home Living Family/patient expects to be discharged to:: Private residence Living Arrangements: Spouse/significant other Available Help at Discharge: Family Type of Home: House Home Access: Stairs to enter Entrance Stairs-Rails: Right Entrance Stairs-Number of Steps: 3 Home Layout: One level Home Equipment: Environmental consultant - 2 wheels;Cane - single point;Grab bars - tub/shower;Hand held shower head;Adaptive equipment;Wheelchair - [employer];Tub bench;Electric scooter      Prior Function Level of Independence: Needs assistance   Gait / Transfers Assistance Needed: Used w/c for community mobility; RW for in home mobility  ADL's / Homemaking Assistance Needed: Wife assisted with all ADLs and shower transfer. Pt able to don/doff R BKA prosthesis with setup        Hand Dominance        Extremity/Trunk Assessment   Upper Extremity Assessment: Generalized weakness           Lower Extremity Assessment: Generalized weakness RLE Deficits / Details: BKA    Cervical / Trunk Assessment: Kyphotic  Communication   Communication: No difficulties  Cognition Arousal/Alertness: Awake/alert Behavior During Therapy: Flat affect (pt tends to defer to  wife for responses to questions) Overall Cognitive Status: Within Functional Limits for tasks assessed                      General Comments      Exercises        Assessment/Plan    PT Assessment Patient needs continued PT services  PT Diagnosis Difficulty  walking;Generalized weakness;Acute pain   PT Problem List Decreased strength;Decreased activity tolerance;Decreased balance;Decreased mobility;Decreased knowledge of use of DME;Pain  PT Treatment Interventions DME instruction;Gait training;Functional mobility training;Therapeutic activities;Patient/family education;Balance training;Therapeutic exercise   PT Goals (Current goals can be found in the Care Plan section) Acute Rehab PT Goals Patient Stated Goal: to return home PT Goal Formulation: With patient/family Time For Goal Achievement: 02/12/16 Potential to Achieve Goals: Good    Frequency Min 3X/week   Barriers to discharge        Co-evaluation               End of Session Equipment Utilized During Treatment: Gait belt Activity Tolerance: Patient limited by fatigue Patient left: in chair;with call bell/phone within reach;with chair alarm set;with family/visitor present      Functional Assessment Tool Used: clinical judgement Functional Limitation: Mobility: Walking and moving around Mobility: Walking and Moving Around Current Status [phone]): At least 1 percent but less than 20 percent impaired, limited or restricted Mobility: Walking and Moving Around Goal Status [phone]): At least 1 percent but less than 20 percent impaired, limited or restricted    Time: 1036-1052 PT Time Calculation (min) (ACUTE ONLY): 16 min   Charges:   PT Evaluation $PT Eval Low Complexity: 1 Procedure     PT G Codes:   PT G-Codes **NOT FOR INPATIENT CLASS** Functional Assessment Tool Used: clinical judgement Functional Limitation: Mobility: Walking and moving around Mobility: Walking and Moving Around Current Status [id number]): At least 1 percent but less than 20 percent impaired, limited or restricted Mobility: Walking and Moving Around Goal Status [phone]): At least 1 percent but less than 20 percent impaired, limited or restricted    Weston Anna, MPT Pager: [phone]

## 2016-01-29 NOTE — Care Management Note (Signed)
Case Management Note  Patient Details  Name: [name] MRN: [id number] Date of Birth: [birth year]  Subjective/Objective: 65 y/o m admitted w/Pyelonephritis. From home. PT recc HHPT. [facility] cohsoen by patient. [facility] rep karen aware & following. Await HHPT, f38f order.                   Action/Plan:d/c home w/HHC.   Expected Discharge Date:                  Expected Discharge Plan:  [place]  In-House Referral:     Discharge planning Services  CM Consult  Post Acute Care Choice:    Choice offered to:  Patient  DME Arranged:    DME Agency:     HH Arranged:    [place] Agency:  [place]  Status of Service:  In process, will continue to follow  Medicare Important Message Given:    Date Medicare IM Given:    Medicare IM give by:    Date Additional Medicare IM Given:    Additional Medicare Important Message give by:     If discussed at [place] of Stay Meetings, dates discussed:    Additional Comments:  Dessa Phi, RN 01/29/2016, 1:06 PM
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
## 2016-01-29 NOTE — Progress Notes (Signed)
Initial Nutrition Assessment  DOCUMENTATION CODES:   Obesity unspecified  INTERVENTION:  - Will monitor for needs at follow-up, including need to add extra protein from supplements or snacks  NUTRITION DIAGNOSIS:   Inadequate oral intake related to acute illness, nausea, vomiting as evidenced by per patient/family report.  GOAL:   Patient will meet greater than or equal to 90% of their needs  MONITOR:   PO intake, Weight trends, Labs, Skin, I & O's  REASON FOR ASSESSMENT:   Malnutrition Screening Tool  ASSESSMENT:   65 y.o. male with medical history significant of bilateral kidney stone (s/p of recent bilateral nephrolithotomy), primary hyperparathyroidism, hypertension, hyperlipidemia, diabetes mellitus, GERD, depression, anxiety, CAD, PVD, GERD, erectile dysfunction, headache, s/p of right BKA, who presents with dysuria, right flank pain, fever, nausea and vomiting.  Pt seen for MST. BMI indicates obesity; IBW adjusted for R BKA. No intakes documented since admission and pt states he has not eaten anything yet today related to lack of appetite and nausea. Pt states that for 3-4 days PTA he was experiencing N/V/D with very poor intakes. He states that last episode of emesis was PTA and that symptoms have been well controlled with medication since admission. Pt states that he has not been feeling well since surgery in April to remove kidney stones and that he has not been eating as much as usual because of this.   Pt reports that in the past month he has lost 10 lbs. Per chart review, pt has lost 15 lbs in the past . This indicates 5.5% body weight loss in 6 weeks which is not significant for time frame. No muscle or fat wasting noted during physical assessment. Pt does not meet criteria for malnutrition at this time but will continue to monitor.   Not meeting needs for a few days. Medications reviewed. Labs reviewed; Na: 133 mmol/L, K: 3.4 mmol/L, Ca: 8.7 mg/dL.   Diet Order:   Diet Heart Room service appropriate?: Yes; Fluid consistency:: Thin  Skin:  Wound (see comment) (Back incision (12/24/15))  Last BM:  PTA  Height:   Ht Readings from Last 1 Encounters:  01/29/16 6\' 1"  (1.854 m)    Weight:   Wt Readings from Last 1 Encounters:  01/29/16 260 lb 9.3 oz (118.2 kg)    Ideal Body Weight:  78.71 kg (kg)  BMI:  Body mass index is 34.39 kg/(m^2).  Estimated Nutritional Needs:   Kcal:  1700-1900  Protein:  95-105 grams  Fluid:  2 L/day  EDUCATION NEEDS:   No education needs identified at this time    [name], RD, LDN Inpatient Clinical Dietitian Pager # [phone] After hours/weekend pager # [phone]

## 2016-01-29 NOTE — Progress Notes (Signed)
Pharmacy Antibiotic Note  [name] is a 65 y.o. male admitted on 01/28/2016 with Pyelonephritis, sepsis.  Pharmacy has been consulted for Cefepime dosing.  Plan: Cefepime 1gm iv q8hr     Temp (24hrs), Avg:101 F (38.3 C), Min:100.5 F (38.1 C), Max:101.5 F (38.6 C)   Recent Labs Lab 01/28/16 2324 01/29/16 0015 01/29/16 0112  WBC 3.9*  --   --   CREATININE 0.67  --   --   LATICACIDVEN  --  1.64 1.0    Estimated Creatinine Clearance: 131.2 mL/min (by C-G formula based on Cr of 0.67).    Allergies  Allergen Reactions  . Symbicort [Budesonide-Formoterol Fumarate] Other (See Comments)    Hyperglycemia   . Advair Diskus [Fluticasone-Salmeterol] Other (See Comments)    hyperglycemia  . Hydromorphone Nausea And Vomiting    Itching   . Morphine Itching  . Soap Itching and Other (See Comments)    Other Reaction: ivory soap=itching     Antimicrobials this admission: Cefepime 5/16 >>  Dose adjustments this admission: -  Microbiology results: pending  Thank you for allowing pharmacy to be a part of this patient's care.  Nani Skillern Crowford 01/29/2016 4:22 AM

## 2016-01-29 NOTE — Care Management Obs Status (Signed)
[place] NOTIFICATION   Patient Details  Name: [name] MRN: [id number] Date of Birth: 16-May-1951   Medicare Observation Status Notification Given:  Yes    MahabirJuliann Pulse, RN 01/29/2016, 1:18 PM

## 2016-01-29 NOTE — Progress Notes (Signed)
Pt still having waves of nausea after  Zofran. Pt requesting phenergan. Also, pt requesting to have his Oxycodone IR 15mg  changed to scheduled Q4-6 hours. MD notified and new orders placed in Epic. Will continue to monitor.

## 2016-01-29 NOTE — Progress Notes (Signed)
[name] is a 65 y.o. male with medical history significant of bilateral kidney stone (s/p of recent bilateral nephrolithotomy), primary hyperparathyroidism, hypertension, hyperlipidemia, diabetes mellitus, GERD, depression, anxiety, CAD, PVD, GERD, erectile dysfunction, headache, s/p of right BKA, who presents with dysuria, right flank pain, fever, nausea and vomiting. He was found to be septic with pyelonephritis, started on IV cefepime and fluids.  He was admitted earlier today by Dr Blaine Hamper, please see his note for details.   Hosie Poisson, MD [phone]
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
## 2016-01-30 LAB — BASIC METABOLIC PANEL
Anion gap: 6 (ref 5–15)
BUN: 7 mg/dL (ref 6–20)
CALCIUM: 8.9 mg/dL (ref 8.9–10.3)
CO2: 22 mmol/L (ref 22–32)
CREATININE: 0.74 mg/dL (ref 0.61–1.24)
Chloride: 104 mmol/L (ref 101–111)
GFR calc non Af Amer: >60 mL/min (ref >60)
Glucose, Bld: 285 mg/dL — ABNORMAL HIGH (ref 65–99)
Potassium: 3.7 mmol/L (ref 3.5–5.1)
SODIUM: 132 mmol/L — AB (ref 135–145)

## 2016-01-30 LAB — GLUCOSE, CAPILLARY
GLUCOSE-CAPILLARY: 256 mg/dL — AB (ref 65–99)
GLUCOSE-CAPILLARY: 357 mg/dL — AB (ref 65–99)
Glucose-Capillary: 296 mg/dL — ABNORMAL HIGH (ref 65–99)
Glucose-Capillary: 286 mg/dL — ABNORMAL HIGH (ref 65–99)

## 2016-01-30 MED ORDER — INSULIN ASPART 100 UNIT/ML ~~LOC~~ SOLN
3.0000 [IU] | Freq: Three times a day (TID) | SUBCUTANEOUS | Status: DC
  Administered 2016-01-31 (×2): 3 [IU] via SUBCUTANEOUS

## 2016-01-30 MED ORDER — ALUM & MAG HYDROXIDE-SIMETH 200-200-20 MG/5ML PO SUSP: 30.0000 mL | Freq: Four times a day (QID) | ORAL | Status: DC | PRN

## 2016-01-30 MED ORDER — PANTOPRAZOLE SODIUM 40 MG PO TBEC
40.0000 mg | DELAYED_RELEASE_TABLET | Freq: Two times a day (BID) | ORAL | Status: DC
  Administered 2016-01-30 – 2016-02-02 (×7): 40 mg via ORAL
  Filled 2016-01-30 (×7): qty 1

## 2016-01-30 MED ORDER — GI COCKTAIL ~~LOC~~
30.0000 mL | Freq: Once | ORAL | Status: AC
  Administered 2016-01-30: 30 mL via ORAL
  Filled 2016-01-30: qty 30

## 2016-01-30 NOTE — Progress Notes (Signed)
PT Cancellation Note  Patient Details Name: DRESON NAZARENO MRN: [id number] DOB: 03-06-1951   Cancelled Treatment:    Reason Eval/Treat Not Completed: Attempted PT tx session, pt declined to participate. Will check back another time/day.    Weston Anna, MPT Pager: [phone]

## 2016-01-30 NOTE — Progress Notes (Signed)
PROGRESS NOTE    [name]  [id number] DOB: 08-23-1951 DOA: 01/28/2016 PCP: James Agreste, MD    Brief Narrative: [name] is a 65 y.o. male with medical history significant of bilateral kidney stone (s/p of recent bilateral nephrolithotomy), primary hyperparathyroidism, hypertension, hyperlipidemia, diabetes mellitus, GERD, depression, anxiety, CAD, PVD, GERD, erectile dysfunction, headache, s/p of right BKA, who presents with dysuria, right flank pain, fever, nausea and vomiting.   Assessment & Plan:   Principal Problem:   Pyelonephritis Active Problems:   Primary hyperparathyroidism ([place])   HYPERCHOLESTEROLEMIA   Depression   Essential hypertension   ERECTILE DYSFUNCTION, ORGANIC   Sleep apnea   Chronic pain syndrome   Gastroesophageal reflux disease with hiatal hernia   DM (diabetes mellitus) type II uncontrolled with eye manifestation ([facility])   Sepsis ([facility])   Hypokalemia   Pyelonephritis: Improving flank tenderness.  Urine culture growing E coli. Blood cultures pending.  IV fluids , pain control.   Hypertension: Controlled.   Hyperlipidemia: Continue with zocor.   Diabetes mellitus: not well controlled.  CBG (last 3)   Recent Labs  01/30/16 0746 01/30/16 1214 01/30/16 1707  GLUCAP 296* 256* 357*    Start him on premeal coverage 3 units TIDAC.  HGAB1C is 7.3  Hypokalemia  Replete as needed.    CAD: Denies any chest pain.  Resume aspirin.       DVT prophylaxis:lovenox Code Status: (Full) Family Communication:none at beside.  Disposition Plan: pending urine cultures,   Consultants:  none  Procedures: none   Antimicrobials: cefepime.  5/16   Subjective: REPORTS INDIGESTION, nausea.   Objective: Filed Vitals:   01/29/16 1224 01/29/16 2048 01/30/16 0459 01/30/16 1445  BP: 139/58 138/66 160/75 143/68  Pulse: 82 94 97 83  Temp: 98.3 F (36.8 C) 99.5 F (37.5 C) 99.2 F (37.3 C) 98.3 F (36.8 C)  TempSrc: Oral  Oral Oral Oral  Resp: 18 18 18 18   Height:      Weight:      SpO2: 98% 99% 94% 99%    Intake/Output Summary (Last 24 hours) at 01/30/16 1752 Last data filed at 01/30/16 1711  Gross per 24 hour  Intake 1286.75 ml  Output   3825 ml  Net -2538.25 ml   Filed Weights   01/29/16 0900  Weight: 118.2 kg (260 lb 9.3 oz)    Examination:  General exam: Appears calm and comfortable  Respiratory system: Clear to auscultation. Respiratory effort normal. Cardiovascular system: S1 & S2 heard, RRR. No JVD, murmurs, rubs, gallops or clicks. No pedal edema. Gastrointestinal system: Abdomen is nondistended, soft and nontender. No organomegaly or masses felt. Normal bowel sounds heard. Central nervous system: Alert and oriented. No focal neurological deficits. Extremities: s/p right BKA.  Skin: No rashes, lesions or ulcers Psychiatry: Judgement and insight appear normal. Mood & affect appropriate.     Data Reviewed: I have personally reviewed following labs and imaging studies  CBC:  Recent Labs Lab 01/28/16 2324 01/29/16 0425  WBC 3.9* 3.6*  NEUTROABS 2.3  --   HGB 11.0* 9.9*  HCT 32.6* 29.3*  MCV 98.2 98.7  PLT 265 [id number]   Basic Metabolic Panel:  Recent Labs Lab 01/28/16 2324 01/29/16 0425 01/30/16 1031  NA 129* 133* 132*  K 3.2* 3.4* 3.7  CL 102 106 104  CO2 23 22 22   GLUCOSE 99 82 285*  BUN 9 8 7   CREATININE 0.67 0.66 0.74  CALCIUM 8.9 8.7* 8.9  MG  --  1.9  --  GFR: Estimated Creatinine Clearance: 128.9 mL/min (by C-G formula based on Cr of 0.74). Liver Function Tests:  Recent Labs Lab 01/28/16 2324  AST 39  ALT 25  ALKPHOS 118  BILITOT 1.2  PROT 6.1*  ALBUMIN 3.0*    Recent Labs Lab 01/28/16 2324  LIPASE <10*   No results for input(s): AMMONIA in the last 168 hours. Coagulation Profile:  Recent Labs Lab 01/28/16 2324  INR 1.01   Cardiac Enzymes: No results for input(s): CKTOTAL, CKMB, CKMBINDEX, TROPONINI in the last 168 hours. BNP (last  3 results) No results for input(s): PROBNP in the last 8760 hours. HbA1C: No results for input(s): HGBA1C in the last 72 hours. CBG:  Recent Labs Lab 01/29/16 1602 01/29/16 2033 01/30/16 0746 01/30/16 1214 01/30/16 1707  GLUCAP 237* 291* 296* 256* 357*   Lipid Profile: No results for input(s): CHOL, HDL, LDLCALC, TRIG, CHOLHDL, LDLDIRECT in the last 72 hours. Thyroid Function Tests: No results for input(s): TSH, T4TOTAL, FREET4, T3FREE, THYROIDAB in the last 72 hours. Anemia Panel: No results for input(s): VITAMINB12, FOLATE, FERRITIN, TIBC, IRON, RETICCTPCT in the last 72 hours. Sepsis Labs:  Recent Labs Lab 01/28/16 2324 01/29/16 0015 01/29/16 0112 01/29/16 0425  PROCALCITON 1.43  --   --   --   LATICACIDVEN  --  1.64 1.0 0.7    Recent Results (from the past 240 hour(s))  Urine culture     Status: Abnormal (Preliminary result)   Collection Time: 01/28/16 11:12 PM  Result Value Ref Range Status   Specimen Description URINE, CLEAN CATCH  Final   Special Requests NONE  Final   Culture >=100,000 COLONIES/mL ESCHERICHIA COLI (A)  Final   Report Status PENDING  Incomplete  Culture, blood (x 2)     Status: None (Preliminary result)   Collection Time: 01/29/16  1:03 AM  Result Value Ref Range Status   Specimen Description BLOOD RIGHT ANTECUBITAL  Final   Special Requests BOTTLES DRAWN AEROBIC AND ANAEROBIC 5CC  Final   Culture   Final    NO GROWTH 1 DAY Performed at [facility]    Report Status PENDING  Incomplete  Culture, blood (x 2)     Status: None (Preliminary result)   Collection Time: 01/29/16  1:12 AM  Result Value Ref Range Status   Specimen Description BLOOD RIGHT HAND  Final   Special Requests BOTTLES DRAWN AEROBIC AND ANAEROBIC 5CC  Final   Culture   Final    NO GROWTH 1 DAY Performed at [facility]    Report Status PENDING  Incomplete         Radiology Studies: No results found.      Scheduled Meds: . aspirin EC  81 mg  Oral q morning - 10a  . ceFEPime (MAXIPIME) IV  1 g Intravenous Q8H  . DULoxetine  60 mg Oral Daily  . enoxaparin (LOVENOX) injection  60 mg Subcutaneous Q24H  . insulin aspart  0-5 Units Subcutaneous QHS  . insulin aspart  0-9 Units Subcutaneous TID WC  . oxyCODONE  15 mg Oral Q6H  . pantoprazole  40 mg Oral BID AC  . potassium chloride  40 mEq Oral Once  . pregabalin  100 mg Oral TID  . sodium chloride flush  3 mL Intravenous Q12H  . traZODone  50 mg Oral QHS   Continuous Infusions: . sodium chloride 75 mL/hr at 01/29/16 1559     LOS: 1 day    Time spent: 25 minutes.  Hosie Poisson, MD [facility] Pager [phone]  If 7PM-7AM, please contact night-coverage [email or web address] Password [facility] 01/30/2016, 5:52 PM

## 2016-01-31 ENCOUNTER — Inpatient Hospital Stay (HOSPITAL_COMMUNITY): Payer: Medicare Other

## 2016-01-31 DIAGNOSIS — Z794 Long term (current) use of insulin: Secondary | ICD-10-CM

## 2016-01-31 DIAGNOSIS — N12 Tubulo-interstitial nephritis, not specified as acute or chronic: Secondary | ICD-10-CM

## 2016-01-31 DIAGNOSIS — E1165 Type 2 diabetes mellitus with hyperglycemia: Secondary | ICD-10-CM

## 2016-01-31 DIAGNOSIS — E1139 Type 2 diabetes mellitus with other diabetic ophthalmic complication: Secondary | ICD-10-CM

## 2016-01-31 DIAGNOSIS — G894 Chronic pain syndrome: Secondary | ICD-10-CM

## 2016-01-31 LAB — URINE CULTURE: Culture: >=100000 — AB

## 2016-01-31 LAB — GLUCOSE, CAPILLARY
GLUCOSE-CAPILLARY: 265 mg/dL — AB (ref 65–99)
GLUCOSE-CAPILLARY: 321 mg/dL — AB (ref 65–99)
Glucose-Capillary: 288 mg/dL — ABNORMAL HIGH (ref 65–99)
Glucose-Capillary: 137 mg/dL — ABNORMAL HIGH (ref 65–99)

## 2016-01-31 MED ORDER — INSULIN REGULAR HUMAN (CONC) 500 UNIT/ML ~~LOC~~ SOPN
25.0000 [IU] | PEN_INJECTOR | Freq: Every day | SUBCUTANEOUS | Status: DC
  Administered 2016-02-01: 55 [IU] via SUBCUTANEOUS
  Administered 2016-02-02: 60 [IU] via SUBCUTANEOUS

## 2016-01-31 MED ORDER — TAMSULOSIN HCL 0.4 MG PO CAPS
0.4000 mg | ORAL_CAPSULE | Freq: Every day | ORAL | Status: DC
  Administered 2016-01-31 – 2016-02-02 (×3): 0.4 mg via ORAL
  Filled 2016-01-31 (×3): qty 1

## 2016-01-31 MED ORDER — SODIUM CHLORIDE 0.9 % IV SOLN
500.0000 mg | Freq: Four times a day (QID) | INTRAVENOUS | Status: DC
  Administered 2016-01-31 – 2016-02-02 (×10): 500 mg via INTRAVENOUS
  Filled 2016-01-31 (×11): qty 500

## 2016-01-31 MED ORDER — INSULIN REGULAR HUMAN (CONC) 500 UNIT/ML ~~LOC~~ SOPN
35.0000 [IU] | PEN_INJECTOR | Freq: Every day | SUBCUTANEOUS | Status: DC
  Administered 2016-02-01: 75 [IU] via SUBCUTANEOUS
  Administered 2016-02-02: 80 [IU] via SUBCUTANEOUS

## 2016-01-31 MED ORDER — INSULIN REGULAR HUMAN (CONC) 500 UNIT/ML ~~LOC~~ SOPN
25.0000 [IU] | PEN_INJECTOR | Freq: Every day | SUBCUTANEOUS | Status: DC
  Administered 2016-01-31: 70 [IU] via SUBCUTANEOUS
  Administered 2016-02-01: 25 [IU] via SUBCUTANEOUS

## 2016-01-31 NOTE — Progress Notes (Signed)
PT Cancellation Note  Patient Details Name: [name] MRN: [id number] DOB: 1951/06/23   Cancelled Treatment:    Reason Eval/Treat Not Completed: Patient declined, no reason specified   Weston Anna, MPT Pager: [phone]
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
## 2016-01-31 NOTE — Progress Notes (Signed)
OT Cancellation Note  Patient Details Name: [name] MRN: [id number] DOB: 08/31/1951   Cancelled Treatment:    Reason Eval/Treat Not Completed: Other (comment) -- Attempted OT treatment. Patient reports he has been toileting by himself, and toileting was only goal set by OT. Family member in room confirms pt has been getting up to bathroom without difficulty. Patient did not wish to practice this task again as he reports he just got back from the bathroom. No further OT needs at this time. OT will sign off.  Maleta Pacha A 01/31/2016, 10:38 AM

## 2016-01-31 NOTE — Progress Notes (Signed)
PROGRESS NOTE        PATIENT DETAILS Name: [name] Age: 65 y.o. Sex: male Date of Birth: Dec 27, 1950 Admit Date: 01/28/2016 Admitting Physician Ivor Costa, MD [id number] R, MD  Brief Narrative: Patient is a 66 y.o. male recent hx of bilateral kidney stone (s/p bilateral nephrostomy and subsequent bilateral nephrolithotomy) presented with sepsis secondary to pyelonephritis. Urine cultures positive for ESBL Escherichia coli.  Subjective: No vomiting, afebrile, continues to have pain in his bilateral flank area. Feels somewhat better than on admission.  Assessment/Plan: Principal Problem: Pyelonephritis: Slowly improving, remains afebrile-no further vomiting. Urine culture positive for ESBL Escherichia coli, switch to Primaxin. Suspect that this could be related to recent urological procedures, hence will obtain CT renal stone study. At request of family, will consult urology.  Active Problems: Hypertension: Controlled-currently not on any antihypertensives.Continue prn IV Hydralazine.  Insulin-dependent diabetes: CBGs on the higher side-family to bring patient's U 500 insulin from home-okay to resume at sliding scale dose.  Dyslipidemia: Continue statin  Chronic pain syndrome:c/w prn oxycodone,continue lyrica and cymbalta  History of TIA/CAD: Stable, continue aspirin and statin.  History of depression: Stable on Cymbalta  History of GERD: Continue PPI  DVT Prophylaxis: Prophylactic Lovenox   Code Status: Full code   Family Communication: Daughter at bedside  Disposition Plan: Remain inpatient  Antimicrobial agents: IV Primaxin 5/18>> IV Cefepime 5/16>>5/18  Procedures: None  CONSULTS:  urology  Time spent: 25 minutes-Greater than 50% of this time was spent in counseling, explanation of diagnosis, planning of further management, and coordination of care.  MEDICATIONS: Anti-infectives    Start     Dose/Rate Route  Frequency Ordered Stop   01/31/16 1030  imipenem-cilastatin (PRIMAXIN) 500 mg in sodium chloride 0.9 % 100 mL IVPB     500 mg 200 mL/hr over 30 Minutes Intravenous Every 6 hours 01/31/16 1001     01/29/16 0800  ceFEPIme (MAXIPIME) 1 g in dextrose 5 % 50 mL IVPB  Status:  Discontinued     1 g 100 mL/hr over 30 Minutes Intravenous Every 8 hours 01/29/16 0419 01/31/16 0947   01/29/16 0015  cefTRIAXone (ROCEPHIN) 1 g in dextrose 5 % 50 mL IVPB  Status:  Discontinued     1 g 100 mL/hr over 30 Minutes Intravenous  Once 01/29/16 0006 01/29/16 0006   01/29/16 0015  ceFEPIme (MAXIPIME) 2 g in dextrose 5 % 50 mL IVPB     2 g 100 mL/hr over 30 Minutes Intravenous  Once 01/29/16 0012 01/29/16 0105      Scheduled Meds: . aspirin EC  81 mg Oral q morning - 10a  . DULoxetine  60 mg Oral Daily  . enoxaparin (LOVENOX) injection  60 mg Subcutaneous Q24H  . imipenem-cilastatin  500 mg Intravenous Q6H  . insulin aspart  0-5 Units Subcutaneous QHS  . insulin aspart  0-9 Units Subcutaneous TID WC  . insulin aspart  3 Units Subcutaneous TID WC  . oxyCODONE  15 mg Oral Q6H  . pantoprazole  40 mg Oral BID AC  . potassium chloride  40 mEq Oral Once  . pregabalin  100 mg Oral TID  . sodium chloride flush  3 mL Intravenous Q12H  . traZODone  50 mg Oral QHS   Continuous Infusions: . sodium chloride 75 mL/hr at 01/31/16 0532   PRN Meds:.acetaminophen **OR** acetaminophen, alum &  mag hydroxide-simeth, docusate sodium, hydrALAZINE, LORazepam, ondansetron, ondansetron, promethazine, zolpidem   PHYSICAL EXAM: Vital signs: Filed Vitals:   01/30/16 0459 01/30/16 1445 01/30/16 2142 01/31/16 0525  BP: 160/75 143/68 156/54 152/53  Pulse: 97 83 78 77  Temp: 99.2 F (37.3 C) 98.3 F (36.8 C) 99.1 F (37.3 C) 98.6 F (37 C)  TempSrc: Oral Oral Oral Oral  Resp: 18 18 20 20   Height:      Weight:      SpO2: 94% 99% 99% 97%   Filed Weights   01/29/16 0900  Weight: 118.2 kg (260 lb 9.3 oz)   Body mass  index is 34.39 kg/(m^2).   Gen Exam: Awake and alert with clear speech. Not in any distress  Neck: Supple, No JVD.   Chest: B/L Clear.   CVS: S1 S2 Regular, no murmurs.  Abdomen: soft, BS +, +Bilateral CVA tenderness Extremities: no edema, lower extremities warm to touch. Neurologic: Non Focal.  Skin: No Rash or lesions   Wounds: N/A.   LABORATORY DATA: CBC:  Recent Labs Lab 01/28/16 2324 01/29/16 0425  WBC 3.9* 3.6*  NEUTROABS 2.3  --   HGB 11.0* 9.9*  HCT 32.6* 29.3*  MCV 98.2 98.7  PLT 265 [id number]    Basic Metabolic Panel:  Recent Labs Lab 01/28/16 2324 01/29/16 0425 01/30/16 1031  NA 129* 133* 132*  K 3.2* 3.4* 3.7  CL 102 106 104  CO2 23 22 22   GLUCOSE 99 82 285*  BUN 9 8 7   CREATININE 0.67 0.66 0.74  CALCIUM 8.9 8.7* 8.9  MG  --  1.9  --     GFR: Estimated Creatinine Clearance: 128.9 mL/min (by C-G formula based on Cr of 0.74).  Liver Function Tests:  Recent Labs Lab 01/28/16 2324  AST 39  ALT 25  ALKPHOS 118  BILITOT 1.2  PROT 6.1*  ALBUMIN 3.0*    Recent Labs Lab 01/28/16 2324  LIPASE <10*   No results for input(s): AMMONIA in the last 168 hours.  Coagulation Profile:  Recent Labs Lab 01/28/16 2324  INR 1.01    Cardiac Enzymes: No results for input(s): CKTOTAL, CKMB, CKMBINDEX, TROPONINI in the last 168 hours.  BNP (last 3 results) No results for input(s): PROBNP in the last 8760 hours.  HbA1C: No results for input(s): HGBA1C in the last 72 hours.  CBG:  Recent Labs Lab 01/30/16 1214 01/30/16 1707 01/30/16 2140 01/31/16 0747 01/31/16 1150  GLUCAP 256* 357* 286* 265* 288*    Lipid Profile: No results for input(s): CHOL, HDL, LDLCALC, TRIG, CHOLHDL, LDLDIRECT in the last 72 hours.  Thyroid Function Tests: No results for input(s): TSH, T4TOTAL, FREET4, T3FREE, THYROIDAB in the last 72 hours.  Anemia Panel: No results for input(s): VITAMINB12, FOLATE, FERRITIN, TIBC, IRON, RETICCTPCT in the last 72 hours.  Urine  analysis:    Component Value Date/Time   COLORURINE AMBER* 01/28/2016 2312   APPEARANCEUR TURBID* 01/28/2016 2312   LABSPEC 1.018 01/28/2016 2312   PHURINE 6.5 01/28/2016 2312   GLUCOSEU 100* 01/28/2016 2312   GLUCOSEU 500 03/25/2012 1101   HGBUR SMALL* 01/28/2016 2312   BILIRUBINUR NEGATIVE 01/28/2016 2312   BILIRUBINUR negative 10/31/2015 1514   BILIRUBINUR neg 01/19/2013 1125   [address] 01/28/2016 2312   KETONESUR negative 10/31/2015 1514   PROTEINUR NEGATIVE 01/28/2016 2312   PROTEINUR =100* 10/31/2015 1514   PROTEINUR neg 01/19/2013 1125   UROBILINOGEN >=8.0 10/31/2015 1514   UROBILINOGEN 1.0 03/25/2012 1101   NITRITE NEGATIVE 01/28/2016 2312  NITRITE Negative 10/31/2015 1514   NITRITE neg 01/19/2013 1125   LEUKOCYTESUR LARGE* 01/28/2016 2312    Sepsis Labs: Lactic Acid, Venous    Component Value Date/Time   LATICACIDVEN 0.7 01/29/2016 0425    MICROBIOLOGY: Recent Results (from the past 240 hour(s))  Urine culture     Status: Abnormal   Collection Time: 01/28/16 11:12 PM  Result Value Ref Range Status   Specimen Description URINE, CLEAN CATCH  Final   Special Requests NONE  Final   Culture (A)  Final    >=100,000 COLONIES/mL ESCHERICHIA COLI Confirmed Extended Spectrum Beta-Lactamase Producer (ESBL) Performed at [facility]    Report Status 01/31/2016 FINAL  Final   Organism ID, Bacteria ESCHERICHIA COLI (A)  Final      Susceptibility   Escherichia coli - MIC*    AMPICILLIN >=32 RESISTANT Resistant     CEFAZOLIN >=64 RESISTANT Resistant     CEFTRIAXONE >=64 RESISTANT Resistant     CIPROFLOXACIN >=4 RESISTANT Resistant     GENTAMICIN <=1 SENSITIVE Sensitive     IMIPENEM <=0.25 SENSITIVE Sensitive     NITROFURANTOIN <=16 SENSITIVE Sensitive     TRIMETH/SULFA <=20 SENSITIVE Sensitive     AMPICILLIN/SULBACTAM 16 INTERMEDIATE Intermediate     PIP/TAZO <=4 SENSITIVE Sensitive     * >=100,000 COLONIES/mL ESCHERICHIA COLI  Culture, blood (x  2)     Status: None (Preliminary result)   Collection Time: 01/29/16  1:03 AM  Result Value Ref Range Status   Specimen Description BLOOD RIGHT ANTECUBITAL  Final   Special Requests BOTTLES DRAWN AEROBIC AND ANAEROBIC 5CC  Final   Culture   Final    NO GROWTH 2 DAYS Performed at [facility]    Report Status PENDING  Incomplete  Culture, blood (x 2)     Status: None (Preliminary result)   Collection Time: 01/29/16  1:12 AM  Result Value Ref Range Status   Specimen Description BLOOD RIGHT HAND  Final   Special Requests BOTTLES DRAWN AEROBIC AND ANAEROBIC 5CC  Final   Culture   Final    NO GROWTH 2 DAYS Performed at [facility]    Report Status PENDING  Incomplete    RADIOLOGY STUDIES/RESULTS: No results found.   LOS: 2 days   Oren Binet, MD  [facility] Pager:[phone]  If 7PM-7AM, please contact night-coverage [email or web address] Password [email or web address] 01/31/2016, 2:02 PM

## 2016-01-31 NOTE — Progress Notes (Signed)
Pharmacy Antibiotic Note  [name] is a 65 y.o. male admitted on 01/28/2016 with Pyelonephritis, sepsis.  Cefepime was started on admission for infection.  Urine culture now positive for ESBL Ecoli.  To change abx to primaxin.  Plan: - primaxin 500 mg IV q6h  ______________________  Height: 6\' 1"  (185.4 cm) Weight: 260 lb 9.3 oz (118.2 kg) IBW/kg (Calculated) : 79.9  Temp (24hrs), Avg:98.7 F (37.1 C), Min:98.3 F (36.8 C), Max:99.1 F (37.3 C)   Recent Labs Lab 01/28/16 2324 01/29/16 0015 01/29/16 0112 01/29/16 0425 01/30/16 1031  WBC 3.9*  --   --  3.6*  --   CREATININE 0.67  --   --  0.66 0.74  LATICACIDVEN  --  1.64 1.0 0.7  --     Estimated Creatinine Clearance: 128.9 mL/min (by C-G formula based on Cr of 0.74).    Allergies  Allergen Reactions  . Symbicort [Budesonide-Formoterol Fumarate] Other (See Comments)    Hyperglycemia   . Advair Diskus [Fluticasone-Salmeterol] Other (See Comments)    hyperglycemia  . Hydromorphone Nausea And Vomiting    Itching   . Morphine Itching  . Soap Itching and Other (See Comments)    Other Reaction: ivory soap=itching    Antimicrobials this admission: 5/16 Cefepime >>5/18 5/18 primaxin>>  Dose adjustments this admission: n/a  Microbiology results: 5/16 BCx x2:  5/16 UCx: >100L ESBL ecoli FINAL  Thank you for allowing pharmacy to be a part of this patient's care.  Lynelle Doctor 01/31/2016 10:26 AM

## 2016-02-01 DIAGNOSIS — I10 Essential (primary) hypertension: Secondary | ICD-10-CM

## 2016-02-01 LAB — GLUCOSE, CAPILLARY
GLUCOSE-CAPILLARY: 171 mg/dL — AB (ref 65–99)
GLUCOSE-CAPILLARY: 75 mg/dL (ref 65–99)
Glucose-Capillary: 144 mg/dL — ABNORMAL HIGH (ref 65–99)
Glucose-Capillary: 62 mg/dL — ABNORMAL LOW (ref 65–99)
Glucose-Capillary: 98 mg/dL (ref 65–99)

## 2016-02-01 LAB — CBC
HEMATOCRIT: 31.8 % — AB (ref 39.0–52.0)
HEMOGLOBIN: 10.6 g/dL — AB (ref 13.0–17.0)
MCH: 32.2 pg (ref 26.0–34.0)
MCHC: 33.3 g/dL (ref 30.0–36.0)
MCV: 96.7 fL (ref 78.0–100.0)
Platelets: 307 10*3/uL (ref 150–400)
RBC: 3.29 MIL/uL — ABNORMAL LOW (ref 4.22–5.81)
RDW: 15.7 % — ABNORMAL HIGH (ref 11.5–15.5)
WBC: 5.1 10*3/uL (ref 4.0–10.5)

## 2016-02-01 LAB — BASIC METABOLIC PANEL
ANION GAP: 5 (ref 5–15)
BUN: <5 mg/dL — ABNORMAL LOW (ref 6–20)
CHLORIDE: 107 mmol/L (ref 101–111)
CO2: 27 mmol/L (ref 22–32)
CREATININE: 0.59 mg/dL — AB (ref 0.61–1.24)
Calcium: 9.6 mg/dL (ref 8.9–10.3)
GFR calc non Af Amer: >60 mL/min (ref >60)
Glucose, Bld: 134 mg/dL — ABNORMAL HIGH (ref 65–99)
Potassium: 4 mmol/L (ref 3.5–5.1)
Sodium: 139 mmol/L (ref 135–145)

## 2016-02-01 MED ORDER — SODIUM CHLORIDE 0.9 % IV SOLN: 1.0000 g | INTRAVENOUS | Status: DC

## 2016-02-01 MED ORDER — SODIUM CHLORIDE 0.9% FLUSH
10.0000 mL | INTRAVENOUS | Status: DC | PRN
  Administered 2016-02-02: 10 mL
  Filled 2016-02-01: qty 40

## 2016-02-01 NOTE — Progress Notes (Signed)
Inpatient Diabetes Program Recommendations  AACE/ADA: New Consensus Statement on Inpatient Glycemic Control (2015)  Target Ranges:  Prepandial:   less than 140 mg/dL      Peak postprandial:   less than 180 mg/dL (1-2 hours)      Critically ill patients:  140 - 180 mg/dL   Review of Glycemic Control  Results for SLY, MEDOR (MRN [id number]) as of 02/01/2016 10:15  Ref. Range 01/31/2016 07:47 01/31/2016 11:50 01/31/2016 16:41 01/31/2016 21:32 02/01/2016 07:34  Glucose-Capillary Latest Ref Range: 65-99 mg/dL 265 (H) 288 (H) 321 (H) 137 (H) 144 (H)  Results for ANUBIS, BLEWITT (MRN [id number]) as of 02/01/2016 10:15  Ref. Range 12/06/2015 04:50  Hemoglobin A1C Latest Ref Range: 4.8-5.6 % 7.3 (H)  On U-500 insulin at home. Talked with MD regarding his home doseage. Per MD pt will follow s/s for U-500 tidwc.  35-105 units at Breakfast 25-85 units at Lunch 25-85 units at [place]  Blood sugars much improved. Continue with U-500 insulin per pt's home dose.  Will follow. Thank you. Lorenda Peck, RD, LDN, CDE Inpatient Diabetes Coordinator [phone]

## 2016-02-01 NOTE — Progress Notes (Signed)
Pt refusing bed alarm at this time. Pt educated on importance of safety while in hospital. Pt verbalized understanding. Will continue to monitor pt closely. [name] I

## 2016-02-01 NOTE — Progress Notes (Signed)
Peripherally Inserted Central Catheter/Midline Placement  The IV Nurse has discussed with the patient and/or persons authorized to consent for the patient, the purpose of this procedure and the potential benefits and risks involved with this procedure.  The benefits include less needle sticks, lab draws from the catheter and patient may be discharged home with the catheter.  Risks include, but not limited to, infection, bleeding, blood clot (thrombus formation), and puncture of an artery; nerve damage and irregular heat beat.  Alternatives to this procedure were also discussed.  PICC/Midline Placement Documentation        [name] 02/01/2016, 2:45 PM

## 2016-02-01 NOTE — Consult Note (Signed)
   [facility] CM Inpatient Consult   02/01/2016  [place] Jul 08, 1951 [id number]   Patient screened for [place] Management services. Went to bedside to offer and explain [facility] Care Management program with patient. Patient declined [place] Management follow up. Mr. Potthoff states he will have [facility] and does not need any additional services.Avelino Leeds [facility] Care Management brochure with contact information to call in future if changes mind. Will make inpatient RNCM aware that patient declined [place] Management program services.  Marthenia Rolling, MSN-Ed, RN,BSN [facility] Liaison [phone]

## 2016-02-01 NOTE — Progress Notes (Signed)
Nutrition Follow-up  DOCUMENTATION CODES:   Obesity unspecified  INTERVENTION:  - Continue Heart Healthy diet  NUTRITION DIAGNOSIS:   Inadequate oral intake related to acute illness, nausea, vomiting as evidenced by per patient/family report. -improving  GOAL:   Patient will meet greater than or equal to 90% of their needs -unmet or minimally met on average  MONITOR:   PO intake, Weight trends, Labs, Skin, I & O's  ASSESSMENT:   65 y.o. male with medical history significant of bilateral kidney stone (s/p of recent bilateral nephrolithotomy), primary hyperparathyroidism, hypertension, hyperlipidemia, diabetes mellitus, GERD, depression, anxiety, CAD, PVD, GERD, erectile dysfunction, headache, s/p of right BKA, who presents with dysuria, right flank pain, fever, nausea and vomiting.  5/19 No intakes documented since previous assessment. Pt reports that nausea has been well-controlled on average; when he does begin to feel nauseated it subsides with medication. Pt eating breakfast sandwich from [employer] at time of RD visit and reports good tolerance of this. Pt states appetite starting to improve although at baseline he does not eat much at any one time.  Significant other at bedside and reports that pt does not have teet so foods need to be soft, nonfibrous for him to be able to consume them and that pt does not digest some foods as well (she gives the example of corn on the cob).  Pt likely not fully meeting needs at this time. DM Coordinator saw pt earlier this AM for discussion about medications at home. Significant other informed RD that pt is picky about foods and often does not like the "diet" varieties of items such as soda; she states pt takes high doses of insulin at home. Medications reviewed. Labs reviewed; 137-321 mg/dL since 7/84 AM, BUN <5 mg/dL, creatinine low.    5/16 - BMI indicates obesity; IBW adjusted for R BKA.  - Pt states he has not eaten anything yet today  related to lack of appetite and nausea.  - Pt states that for 3-4 days PTA he was experiencing N/V/D with very poor intakes.  - He states that last episode of emesis was PTA and that symptoms have been well controlled since admission. - Pt states that he has not been feeling well since surgery in April to remove kidney stones and that he has not been eating as much as usual because of this.  - Pt reports that in the past month he has lost 10 lbs.  - Per chart review, pt has lost 15 lbs in the past .  - This indicates 5.5% body weight loss in 6 weeks which is not significant for time frame.  - No muscle or fat wasting noted during physical assessment.  - Pt does not meet criteria for malnutrition at this time but will continue to monitor.  - Not meeting needs for a few days PTA.  Diet Order:  Diet Heart Room service appropriate?: Yes; Fluid consistency:: Thin  Skin:  Wound (see comment) (Back incision (12/24/15))  Last BM:  5/18  Height:   Ht Readings from Last 1 Encounters:  01/29/16 6\' 1"  (1.854 m)    Weight:   Wt Readings from Last 1 Encounters:  01/29/16 260 lb 9.3 oz (118.2 kg)    Ideal Body Weight:  78.71 kg (kg)  BMI:  Body mass index is 34.39 kg/(m^2).  Estimated Nutritional Needs:   Kcal:  1700-1900  Protein:  95-105 grams  Fluid:  2 L/day  EDUCATION NEEDS:   No education needs identified at this  time     Jarome Matin, RD, LDN Inpatient Clinical Dietitian Pager # [phone] After hours/weekend pager # [phone]

## 2016-02-01 NOTE — Progress Notes (Signed)
[facility]  Patient Status:    New pt for [facility] this admission  [facility] is providing the following services: HHRN adn Home Infusion Pharmacy team for home IV ABX. [facility] team will provide in hospital teaching regarding IV ABX administration to support independence at home upon DC.  We will be prepared for Saturday DC and will need IV ABX script to provide IV ABX.  [place] weekend team on site will support DC to home.   If patient discharges after hours, please call [phone].   Larry Sierras 02/01/2016, 10:56 AM

## 2016-02-01 NOTE — Care Management Important Message (Addendum)
Important Message  Patient Details IM Letter given to Kathy/Case Manager to present to patient Name: [name] MRN: [id number] Date of Birth: 12-10-1950   Medicare Important Message Given:  Yes    Camillo Flaming 02/01/2016, 9:08 AMImportant Message  Patient Details  Name: [name] MRN: [id number] Date of Birth: [birth year]   Medicare Important Message Given:  Yes    Camillo Flaming 02/01/2016, 9:07 AM

## 2016-02-01 NOTE — Care Management Note (Signed)
Case Management Note  Patient Details  Name: [name] MRN: [id number] Date of Birth: May 22, 1951  Subjective/Objective:  [facility] rep Santiago Glad [facility] IV therapy liason Pam aware of long term iv abx, for picc today, & d/c in am. Await [facility]HC-HHRN, iv therapy orders,script.                  Action/Plan:d/c home w/HHC.   Expected Discharge Date:                  Expected Discharge Plan:  [place]  In-House Referral:     Discharge planning Services  CM Consult  Post Acute Care Choice:    Choice offered to:  Patient  DME Arranged:    DME Agency:     HH Arranged:  IV Antibiotics HH Agency:  [name]  Status of Service:  In process, will continue to follow  Medicare Important Message Given:  Yes Date Medicare IM Given:    Medicare IM give by:    Date Additional Medicare IM Given:    Additional Medicare Important Message give by:     If discussed at [place] of Stay Meetings, dates discussed:    Additional Comments:  Dessa Phi, RN 02/01/2016, 2:24 PM

## 2016-02-01 NOTE — Progress Notes (Addendum)
PROGRESS NOTE        PATIENT DETAILS Name: [name] Age: 65 y.o. Sex: male Date of Birth: 07/06/1951 Admit Date: 01/28/2016 Admitting Physician Ivor Costa, MD [id number] R, MD  Brief Narrative: Patient is a 65 y.o. male recent hx of bilateral kidney stone (s/p bilateral nephrostomy and subsequent bilateral nephrolithotomy) presented with sepsis secondary to pyelonephritis. Urine cultures positive for ESBL Escherichia coli.  Subjective: Slowly improving-has chronic back pain that is essentially unchanged. No vomiting.   Assessment/Plan: Principal Problem: Pyelonephritis: Slowly improving, remains afebrile-no further vomiting. Urine culture positive for ESBL Escherichia coli, switch to Primaxin while inpatient, and plan to transition to [facility] on discharge. Spoke with Dr. Linus Salmons, recommendations one more week of antibiotics on discharge. Suspect likely related to recent urological procedures,  CT renal stone study showed small residual non obstructing stones bilaterally.Place PICC line.  Active Problems: Recent Nephrolithiasis: requiring  bilateral nephrostomy and subsequent bilateral nephrolithotomy-Urology consulted during this hospital stay. CT renal stone study showed small residual non obstructing stones bilaterally.  Hypertension: Controlled-currently not on any antihypertensives.Continue prn IV Hydralazine.  Insulin-dependent diabetes: CBGs stable with home regimen of  U 500 insulin from home  Dyslipidemia: Continue statin  Chronic pain syndrome:has chronic back pain-c/w prn oxycodone,continue lyrica and cymbalta  History of TIA/CAD: Stable, continue aspirin and statin.  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  MEDICATIONS: Anti-infectives    Start     Dose/Rate Route Frequency Ordered Stop   01/31/16 1030  imipenem-cilastatin (PRIMAXIN) 500 mg in sodium chloride 0.9 % 100 mL IVPB     500 mg 200 mL/hr over 30 Minutes Intravenous Every 6 hours 01/31/16 1001     01/29/16 0800  ceFEPIme (MAXIPIME) 1 g in dextrose 5 % 50 mL IVPB  Status:  Discontinued     1 g 100 mL/hr over 30 Minutes Intravenous Every 8 hours 01/29/16 0419 01/31/16 0947   01/29/16 0015  cefTRIAXone (ROCEPHIN) 1 g in dextrose 5 % 50 mL IVPB  Status:  Discontinued     1 g 100 mL/hr over 30 Minutes Intravenous  Once 01/29/16 0006 01/29/16 0006   01/29/16 0015  ceFEPIme (MAXIPIME) 2 g in dextrose 5 % 50 mL IVPB     2 g 100 mL/hr over 30 Minutes Intravenous  Once 01/29/16 0012 01/29/16 0105      Scheduled Meds: . aspirin EC  81 mg Oral q morning - 10a  . DULoxetine  60 mg Oral Daily  . enoxaparin (LOVENOX) injection  60 mg Subcutaneous Q24H  . imipenem-cilastatin  500 mg Intravenous Q6H  . insulin regular human CONCENTRATED  25-85 Units Subcutaneous Q lunch  . insulin regular human CONCENTRATED  25-85 Units Subcutaneous Q supper  . insulin regular human CONCENTRATED  35-105 Units Subcutaneous Q breakfast  . oxyCODONE  15 mg Oral Q6H  . pantoprazole  40 mg Oral BID AC  . potassium chloride  40 mEq Oral Once  . pregabalin  100 mg Oral TID  . sodium chloride flush  3 mL Intravenous Q12H  . tamsulosin  0.4 mg Oral QPC supper  . traZODone  50 mg Oral QHS   Continuous Infusions:   PRN Meds:.acetaminophen **OR** acetaminophen, alum & mag hydroxide-simeth, docusate sodium, hydrALAZINE, LORazepam, ondansetron, ondansetron, promethazine,  zolpidem   PHYSICAL EXAM: Vital signs: Filed Vitals:   01/31/16 0525 01/31/16 1521 01/31/16 2135 02/01/16 0407  BP: 152/53 148/60 155/64 145/64  Pulse: 77 78 79 82  Temp: 98.6 F (37 C) 98.4 F (36.9 C) 99.3 F (37.4 C) 98.7 F (37.1 C)  TempSrc: Oral Oral Oral Oral  Resp: 20 18 16 16   Height:      Weight:      SpO2: 97% 98% 99% 98%   Filed Weights   01/29/16 0900  Weight: 118.2 kg (260 lb 9.3 oz)   Body mass index is 34.39 kg/(m^2).   Gen Exam: Awake and alert with clear speech. Not in any distress  Neck: Supple, No JVD.   Chest: B/L Clear.   CVS: S1 S2 Regular, no murmurs.  Abdomen: soft, BS +, Non tender and non distended Extremities: no edema, lower extremities warm to touch. Neurologic: Non Focal.  Skin: No Rash or lesions   Wounds: N/A.   LABORATORY DATA: CBC:  Recent Labs Lab 01/28/16 2324 01/29/16 0425 02/01/16 0543  WBC 3.9* 3.6* 5.1  NEUTROABS 2.3  --   --   HGB 11.0* 9.9* 10.6*  HCT 32.6* 29.3* 31.8*  MCV 98.2 98.7 96.7  PLT 265 245 [id number]    Basic Metabolic Panel:  Recent Labs Lab 01/28/16 2324 01/29/16 0425 01/30/16 1031 02/01/16 0543  NA 129* 133* 132* 139  K 3.2* 3.4* 3.7 4.0  CL 102 106 104 107  CO2 23 22 22 27   GLUCOSE 99 82 285* 134*  BUN 9 8 7  <5*  CREATININE 0.67 0.66 0.74 0.59*  CALCIUM 8.9 8.7* 8.9 9.6  MG  --  1.9  --   --     GFR: Estimated Creatinine Clearance: 128.9 mL/min (by C-G formula based on Cr of 0.59).  Liver Function Tests:  Recent Labs Lab 01/28/16 2324  AST 39  ALT 25  ALKPHOS 118  BILITOT 1.2  PROT 6.1*  ALBUMIN 3.0*    Recent Labs Lab 01/28/16 2324  LIPASE <10*   No results for input(s): AMMONIA in the last 168 hours.  Coagulation Profile:  Recent Labs Lab 01/28/16 2324  INR 1.01    Cardiac Enzymes: No results for input(s): CKTOTAL, CKMB, CKMBINDEX, TROPONINI in the last 168 hours.  BNP (last 3 results) No results for input(s): PROBNP in the last 8760 hours.  HbA1C: No  results for input(s): HGBA1C in the last 72 hours.  CBG:  Recent Labs Lab 01/31/16 1150 01/31/16 1641 01/31/16 2132 02/01/16 0734 02/01/16 1203  GLUCAP 288* 321* 137* 144* 171*    Lipid Profile: No results for input(s): CHOL, HDL, LDLCALC, TRIG, CHOLHDL, LDLDIRECT in the last 72 hours.  Thyroid Function Tests: No results for input(s): TSH, T4TOTAL, FREET4, T3FREE, THYROIDAB in the last 72 hours.  Anemia Panel: No results for input(s): VITAMINB12, FOLATE, FERRITIN, TIBC, IRON, RETICCTPCT in the last 72 hours.  Urine analysis:    Component Value Date/Time   COLORURINE AMBER* 01/28/2016 2312   APPEARANCEUR TURBID* 01/28/2016 2312   LABSPEC 1.018 01/28/2016 2312   PHURINE 6.5 01/28/2016 2312   GLUCOSEU 100* 01/28/2016 2312  GLUCOSEU 500 03/25/2012 1101   HGBUR SMALL* 01/28/2016 2312   BILIRUBINUR NEGATIVE 01/28/2016 2312   BILIRUBINUR negative 10/31/2015 1514   BILIRUBINUR neg 01/19/2013 [place] 01/28/2016 2312   KETONESUR negative 10/31/2015 [place] 01/28/2016 2312   PROTEINUR =100* 10/31/2015 1514   PROTEINUR neg 01/19/2013 1125   UROBILINOGEN >=8.0 10/31/2015 1514   UROBILINOGEN 1.0 03/25/2012 1101   NITRITE NEGATIVE 01/28/2016 2312   NITRITE Negative 10/31/2015 1514   NITRITE neg 01/19/2013 1125   LEUKOCYTESUR LARGE* 01/28/2016 2312    Sepsis Labs: Lactic Acid, Venous    Component Value Date/Time   LATICACIDVEN 0.7 01/29/2016 0425    MICROBIOLOGY: Recent Results (from the past 240 hour(s))  Urine culture     Status: Abnormal   Collection Time: 01/28/16 11:12 PM  Result Value Ref Range Status   Specimen Description URINE, CLEAN CATCH  Final   Special Requests NONE  Final   Culture (A)  Final    >=100,000 COLONIES/mL ESCHERICHIA COLI Confirmed Extended Spectrum Beta-Lactamase Producer (ESBL) Performed at [facility]    Report Status 01/31/2016 FINAL  Final   Organism ID, Bacteria ESCHERICHIA COLI (A)   Final      Susceptibility   Escherichia coli - MIC*    AMPICILLIN >=32 RESISTANT Resistant     CEFAZOLIN >=64 RESISTANT Resistant     CEFTRIAXONE >=64 RESISTANT Resistant     CIPROFLOXACIN >=4 RESISTANT Resistant     GENTAMICIN <=1 SENSITIVE Sensitive     IMIPENEM <=0.25 SENSITIVE Sensitive     NITROFURANTOIN <=16 SENSITIVE Sensitive     TRIMETH/SULFA <=20 SENSITIVE Sensitive     AMPICILLIN/SULBACTAM 16 INTERMEDIATE Intermediate     PIP/TAZO <=4 SENSITIVE Sensitive     * >=100,000 COLONIES/mL ESCHERICHIA COLI  Culture, blood (x 2)     Status: None (Preliminary result)   Collection Time: 01/29/16  1:03 AM  Result Value Ref Range Status   Specimen Description BLOOD RIGHT ANTECUBITAL  Final   Special Requests BOTTLES DRAWN AEROBIC AND ANAEROBIC 5CC  Final   Culture   Final    NO GROWTH 3 DAYS Performed at [facility]    Report Status PENDING  Incomplete  Culture, blood (x 2)     Status: None (Preliminary result)   Collection Time: 01/29/16  1:12 AM  Result Value Ref Range Status   Specimen Description BLOOD RIGHT HAND  Final   Special Requests BOTTLES DRAWN AEROBIC AND ANAEROBIC 5CC  Final   Culture   Final    NO GROWTH 3 DAYS Performed at [facility]    Report Status PENDING  Incomplete    RADIOLOGY STUDIES/RESULTS: Ct Renal Stone Study  01/31/2016  CLINICAL DATA:  Recent history of bilateral kidney stones status post bilateral nephrostomy and bilateral nephrolithotomy. Patient presents with sepsis secondary to pyelonephritis. Urine cultures positive for ESBL. Pain in bilateral flank regions. EXAM: CT ABDOMEN AND PELVIS WITHOUT CONTRAST TECHNIQUE: Multidetector CT imaging of the abdomen and pelvis was performed following the standard protocol without IV contrast. COMPARISON:  CT abdomen dated 12/22/2015. FINDINGS: Lower chest:  Mild atelectasis at each lung base, likely chronic. Hepatobiliary: Probable small layering stones within the gallbladder. No evidence of  cholecystitis. Liver is unremarkable. Pancreas: Partially infiltrated with fat but otherwise unremarkable. Spleen: Upper normal in size. Adrenals/Urinary Tract: There has been interval removal of the left-sided percutaneous nephroureteral stent. There is no left-sided perinephric fluid or obvious perinephric inflammation. There is a residual nonobstructing  3 mm stone in the left renal pelvis. There are several residual nonobstructing stones within the right renal pelvis, largest measuring 7 mm. No right-sided perinephric fluid or evidence of perinephric inflammation. No ureteral stone or ureteral dilatation bilaterally. No bladder stones seen. Bladder is partially decompressed which limits characterization of its walls but there is no obvious bladder wall thickening. Prostate gland is moderately enlarged causing slight mass effect on the bladder base. Stomach/Bowel: Bowel is normal in caliber. No bowel wall thickening or evidence of bowel wall inflammation seen. Appendix is not seen but there are no inflammatory changes about the cecum to suggest acute appendicitis. Configuration of the stomach is stable, presumably related to the history of previous partial esophagectomy and presumed gastric pull-up. Vascular/Lymphatic: Scattered atherosclerotic changes of the normal- caliber abdominal aorta. No enlarged lymph nodes seen. Reproductive: Prostate gland moderately enlarged. Otherwise unremarkable. Other: No free fluid or abscess collection identified. No free intraperitoneal air seen. Musculoskeletal: Degenerative changes throughout the thoracolumbar spine, most severe in the lower lumbar spine. Old fractures within the lower ribs bilaterally. No acute or suspicious osseous abnormality seen. There is irregular thickening within the subcutaneous soft tissues of the anterior abdominal wall but this appears chronic and stable compared to multiple prior exams. IMPRESSION: 1. Bilateral nephrolithiasis (small residual  nonobstructing renal stones bilaterally). 2. Interval removal of the left-sided nephroureteral stent. No perinephric fluid or perinephric inflammation seen bilaterally. No hydronephrosis. No ureteral or bladder calculi. Bladder is unremarkable. 3. Probable cholelithiasis without evidence of acute cholecystitis. 4. Additional chronic/incidental findings detailed above. Electronically Signed   By: Franki Cabot M.D.   On: 01/31/2016 16:00     LOS: 3 days   Oren Binet, MD  [facility] Pager:[phone]  If 7PM-7AM, please contact night-coverage [email or web address] Password [email or web address] 02/01/2016, 2:30 PM

## 2016-02-02 LAB — GLUCOSE, CAPILLARY
GLUCOSE-CAPILLARY: 218 mg/dL — AB (ref 65–99)
Glucose-Capillary: 202 mg/dL — ABNORMAL HIGH (ref 65–99)

## 2016-02-02 MED ORDER — PROMETHAZINE HCL 25 MG PO TABS: 25.0000 mg | ORAL_TABLET | Freq: Four times a day (QID) | ORAL | Status: DC | PRN

## 2016-02-02 MED ORDER — HEPARIN SOD (PORK) LOCK FLUSH 100 UNIT/ML IV SOLN
250.0000 [IU] | INTRAVENOUS | Status: AC | PRN
  Administered 2016-02-02: 250 [IU]

## 2016-02-02 MED ORDER — TAMSULOSIN HCL 0.4 MG PO CAPS: 0.4000 mg | ORAL_CAPSULE | Freq: Every day | ORAL | Status: DC

## 2016-02-02 NOTE — Progress Notes (Signed)
CM received call from RN as pt discharging today and needs SOC with home IV ABX.  CM called [facility] rep, Tiffany with notification of discharge and orders have been placed for [facility].  CM faxed IV ABX prescription to [facility] pharmacy.  Unit RN aware.  No other CM needs were communicated.

## 2016-02-02 NOTE — Progress Notes (Signed)
Completed D/C teaching with patient and family. Gave prescriptions. Informed family that [place] would be out to educate on home IV antibiotics. Pt will be D/C with PICC. Pt will be D/C home with family in stable condition.
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
## 2016-02-03 LAB — CULTURE, BLOOD (ROUTINE X 2)
CULTURE: NO GROWTH
CULTURE: NO GROWTH

## 2016-02-05 ENCOUNTER — Ambulatory Visit: Payer: Medicare Other | Attending: Pain Medicine | Admitting: Pain Medicine

## 2016-02-05 ENCOUNTER — Encounter: Payer: Self-pay | Admitting: Pain Medicine

## 2016-02-05 VITALS — BP 131/49 | HR 77 | Temp 99.0°F | Resp 20 | Ht 73.0 in | Wt 254.0 lb

## 2016-02-05 DIAGNOSIS — Z89519 Acquired absence of unspecified leg below knee: Secondary | ICD-10-CM | POA: Insufficient documentation

## 2016-02-05 DIAGNOSIS — M4806 Spinal stenosis, lumbar region: Secondary | ICD-10-CM | POA: Insufficient documentation

## 2016-02-05 DIAGNOSIS — M19011 Primary osteoarthritis, right shoulder: Secondary | ICD-10-CM

## 2016-02-05 DIAGNOSIS — M5136 Other intervertebral disc degeneration, lumbar region: Secondary | ICD-10-CM | POA: Diagnosis not present

## 2016-02-05 DIAGNOSIS — M5127 Other intervertebral disc displacement, lumbosacral region: Secondary | ICD-10-CM | POA: Diagnosis not present

## 2016-02-05 DIAGNOSIS — M5124 Other intervertebral disc displacement, thoracic region: Secondary | ICD-10-CM | POA: Insufficient documentation

## 2016-02-05 DIAGNOSIS — N200 Calculus of kidney: Secondary | ICD-10-CM | POA: Diagnosis not present

## 2016-02-05 DIAGNOSIS — N12 Tubulo-interstitial nephritis, not specified as acute or chronic: Secondary | ICD-10-CM | POA: Insufficient documentation

## 2016-02-05 DIAGNOSIS — G588 Other specified mononeuropathies: Secondary | ICD-10-CM | POA: Insufficient documentation

## 2016-02-05 DIAGNOSIS — M791 Myalgia: Secondary | ICD-10-CM | POA: Diagnosis not present

## 2016-02-05 DIAGNOSIS — G9520 Unspecified cord compression: Secondary | ICD-10-CM | POA: Insufficient documentation

## 2016-02-05 DIAGNOSIS — Z89511 Acquired absence of right leg below knee: Secondary | ICD-10-CM

## 2016-02-05 DIAGNOSIS — M47817 Spondylosis without myelopathy or radiculopathy, lumbosacral region: Secondary | ICD-10-CM | POA: Diagnosis not present

## 2016-02-05 DIAGNOSIS — M5134 Other intervertebral disc degeneration, thoracic region: Secondary | ICD-10-CM | POA: Diagnosis not present

## 2016-02-05 DIAGNOSIS — M2578 Osteophyte, vertebrae: Secondary | ICD-10-CM | POA: Diagnosis not present

## 2016-02-05 DIAGNOSIS — B999 Unspecified infectious disease: Secondary | ICD-10-CM | POA: Diagnosis not present

## 2016-02-05 DIAGNOSIS — M48062 Spinal stenosis, lumbar region with neurogenic claudication: Secondary | ICD-10-CM

## 2016-02-05 DIAGNOSIS — M546 Pain in thoracic spine: Secondary | ICD-10-CM | POA: Diagnosis present

## 2016-02-05 DIAGNOSIS — M5416 Radiculopathy, lumbar region: Secondary | ICD-10-CM | POA: Diagnosis not present

## 2016-02-05 DIAGNOSIS — M5126 Other intervertebral disc displacement, lumbar region: Secondary | ICD-10-CM | POA: Insufficient documentation

## 2016-02-05 DIAGNOSIS — M533 Sacrococcygeal disorders, not elsewhere classified: Secondary | ICD-10-CM | POA: Diagnosis not present

## 2016-02-05 DIAGNOSIS — M545 Low back pain: Secondary | ICD-10-CM | POA: Diagnosis present

## 2016-02-05 DIAGNOSIS — G546 Phantom limb syndrome with pain: Secondary | ICD-10-CM | POA: Diagnosis not present

## 2016-02-05 MED ORDER — OXYCODONE HCL 5 MG PO TABS
ORAL_TABLET | ORAL | Status: DC
Start: 2016-02-05 — End: 2016-03-06

## 2016-02-05 MED ORDER — OXYCODONE HCL 15 MG PO TABS: ORAL_TABLET | ORAL | Status: DC
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
## 2016-02-05 NOTE — Patient Instructions (Addendum)
PLAN   Continue present medication oxycodone. PLEASE NOTE that you have a 5 mg size oxycodone.  F/U PCP Dr. Alyson Ingles for evaliation of  BP, recent hospitalization for UTI and general medical  condition. . Please see Dr. Carlota Raspberry this week  F/U surgical evaluation. May consider further evaluation pending follow-up evaluations  F/U cardiologist as discussed  F/U neurological evaluation for questionable TIA versus CVA  May consider PNCV/EMG studies and other studies pending follow-up evaluations  F/U urological evaluation status post urological surgery as discussed  May consider radiofrequency rhizolysis or intraspinal procedures pending response to present treatment and F/U evaluation.. We will avoid such procedures at this time as well as other aspects of treatment regimen  The patient will continue under the care of Dr. Carlota Raspberry his primary care physician as well as his urologist at this time.  The patient is to call pain management prior to scheduled return appointment for any concerns regarding condition

## 2016-02-05 NOTE — Progress Notes (Signed)
Safety precautions to be maintained throughout the outpatient stay will include: orient to surroundings, keep bed in low position, maintain call bell within reach at all times, provide assistance with transfer out of bed and ambulation.  Patient currently on IV antibiotics for kidney infection.

## 2016-02-05 NOTE — Telephone Encounter (Signed)
Angie at [facility] called in to report a med error.   She replaced the antibiotic saline drip WITHOUT missing it with the powder.   The wife immediately corrected the ERROR.   Angie ordered another bag of saline, so he has what he needs/   The pick line will be taken out on Friday.  Her number is [phone]

## 2016-02-20 ENCOUNTER — Other Ambulatory Visit: Payer: Self-pay | Admitting: Family Medicine

## 2016-02-20 NOTE — Telephone Encounter (Signed)
Yes- Refilled

## 2016-02-20 NOTE — Telephone Encounter (Signed)
Is ok to fill Trazodone?

## 2016-02-22 DIAGNOSIS — N401 Enlarged prostate with lower urinary tract symptoms: Secondary | ICD-10-CM | POA: Diagnosis not present

## 2016-02-22 DIAGNOSIS — N3001 Acute cystitis with hematuria: Secondary | ICD-10-CM | POA: Diagnosis not present

## 2016-02-22 DIAGNOSIS — N200 Calculus of kidney: Secondary | ICD-10-CM | POA: Diagnosis not present

## 2016-02-22 DIAGNOSIS — R3915 Urgency of urination: Secondary | ICD-10-CM | POA: Diagnosis not present

## 2016-02-22 DIAGNOSIS — R350 Frequency of micturition: Secondary | ICD-10-CM | POA: Diagnosis not present

## 2016-03-04 DIAGNOSIS — I10 Essential (primary) hypertension: Secondary | ICD-10-CM | POA: Diagnosis not present

## 2016-03-04 DIAGNOSIS — Z794 Long term (current) use of insulin: Secondary | ICD-10-CM | POA: Diagnosis not present

## 2016-03-04 DIAGNOSIS — E1165 Type 2 diabetes mellitus with hyperglycemia: Secondary | ICD-10-CM | POA: Diagnosis not present

## 2016-03-04 DIAGNOSIS — E782 Mixed hyperlipidemia: Secondary | ICD-10-CM | POA: Diagnosis not present

## 2016-03-06 ENCOUNTER — Encounter: Payer: Self-pay | Admitting: Pain Medicine

## 2016-03-06 ENCOUNTER — Ambulatory Visit: Payer: Medicare Other | Attending: Pain Medicine | Admitting: Pain Medicine

## 2016-03-06 VITALS — BP 129/51 | HR 80 | Temp 98.7°F | Resp 18 | Ht 73.0 in | Wt 254.0 lb

## 2016-03-06 DIAGNOSIS — M5136 Other intervertebral disc degeneration, lumbar region: Secondary | ICD-10-CM | POA: Diagnosis not present

## 2016-03-06 DIAGNOSIS — M5134 Other intervertebral disc degeneration, thoracic region: Secondary | ICD-10-CM | POA: Insufficient documentation

## 2016-03-06 DIAGNOSIS — Z9889 Other specified postprocedural states: Secondary | ICD-10-CM | POA: Insufficient documentation

## 2016-03-06 DIAGNOSIS — Z87442 Personal history of urinary calculi: Secondary | ICD-10-CM | POA: Insufficient documentation

## 2016-03-06 DIAGNOSIS — M48062 Spinal stenosis, lumbar region with neurogenic claudication: Secondary | ICD-10-CM

## 2016-03-06 DIAGNOSIS — G546 Phantom limb syndrome with pain: Secondary | ICD-10-CM | POA: Diagnosis not present

## 2016-03-06 DIAGNOSIS — M4806 Spinal stenosis, lumbar region: Secondary | ICD-10-CM | POA: Insufficient documentation

## 2016-03-06 DIAGNOSIS — Z981 Arthrodesis status: Secondary | ICD-10-CM | POA: Diagnosis not present

## 2016-03-06 DIAGNOSIS — Z89511 Acquired absence of right leg below knee: Secondary | ICD-10-CM

## 2016-03-06 DIAGNOSIS — G588 Other specified mononeuropathies: Secondary | ICD-10-CM | POA: Diagnosis not present

## 2016-03-06 DIAGNOSIS — M47817 Spondylosis without myelopathy or radiculopathy, lumbosacral region: Secondary | ICD-10-CM | POA: Diagnosis not present

## 2016-03-06 DIAGNOSIS — M5124 Other intervertebral disc displacement, thoracic region: Secondary | ICD-10-CM | POA: Insufficient documentation

## 2016-03-06 DIAGNOSIS — M5126 Other intervertebral disc displacement, lumbar region: Secondary | ICD-10-CM | POA: Insufficient documentation

## 2016-03-06 DIAGNOSIS — M533 Sacrococcygeal disorders, not elsewhere classified: Secondary | ICD-10-CM | POA: Diagnosis not present

## 2016-03-06 DIAGNOSIS — Z89519 Acquired absence of unspecified leg below knee: Secondary | ICD-10-CM | POA: Insufficient documentation

## 2016-03-06 DIAGNOSIS — M791 Myalgia: Secondary | ICD-10-CM | POA: Diagnosis not present

## 2016-03-06 DIAGNOSIS — M2578 Osteophyte, vertebrae: Secondary | ICD-10-CM | POA: Insufficient documentation

## 2016-03-06 DIAGNOSIS — M19011 Primary osteoarthritis, right shoulder: Secondary | ICD-10-CM

## 2016-03-06 DIAGNOSIS — M545 Low back pain: Secondary | ICD-10-CM | POA: Diagnosis present

## 2016-03-06 DIAGNOSIS — M79606 Pain in leg, unspecified: Secondary | ICD-10-CM | POA: Diagnosis present

## 2016-03-06 DIAGNOSIS — M5416 Radiculopathy, lumbar region: Secondary | ICD-10-CM | POA: Diagnosis not present

## 2016-03-06 DIAGNOSIS — N200 Calculus of kidney: Secondary | ICD-10-CM

## 2016-03-06 MED ORDER — OXYCODONE HCL 5 MG PO TABS: ORAL_TABLET | ORAL | Status: DC
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
## 2016-03-06 NOTE — Patient Instructions (Addendum)
PLAN   Continue present medication oxycodone. PLEASE NOTE that you have a 5 mg size oxycodone.  F/U PCP Dr. Alyson Ingles for evaliation of  BP,  UTI and general medical  condition. . Follow-up with Dr. Carlota Raspberry or Dr. Alyson Ingles. Patient will follow-up with Dr. Alyson Ingles for continued treatment of urinary tract as discussed  F/U surgical evaluation. May consider further evaluation pending follow-up evaluations  F/U cardiologist as discussed  F/U neurological evaluation for questionable TIA versus CVA  May consider PNCV/EMG studies and other studies pending follow-up evaluations  F/U urological evaluation status post urological surgery as discussed  May consider radiofrequency rhizolysis or intraspinal procedures pending response to present treatment and F/U evaluation.. We will avoid such procedures at this time as well as other aspects of treatment regimen  The patient will continue under the care of Dr. Carlota Raspberry his primary care physician as well as his urologist at this time.  The patient is to call pain management prior to scheduled return appointment for any concerns regarding condition

## 2016-03-06 NOTE — Progress Notes (Signed)
Safety precautions to be maintained throughout the outpatient stay will include: orient to surroundings, keep bed in low position, maintain call bell within reach at all times, provide assistance with transfer out of bed and ambulation.  

## 2016-03-07 ENCOUNTER — Other Ambulatory Visit: Payer: Self-pay | Admitting: Emergency Medicine

## 2016-03-07 ENCOUNTER — Other Ambulatory Visit: Payer: Self-pay | Admitting: Cardiovascular Disease

## 2016-03-10 ENCOUNTER — Telehealth: Payer: Self-pay

## 2016-03-10 NOTE — Telephone Encounter (Signed)
Telephone call from pharmacy.  Wanted to refill patient's dexilant.  I spoke with Dr. Carlota Raspberry and got a verbal order to refill the medication with 90 day supply and 1 refill.  Pharmacy will notify patient.

## 2016-03-27 DIAGNOSIS — N3000 Acute cystitis without hematuria: Secondary | ICD-10-CM | POA: Diagnosis not present

## 2016-03-29 ENCOUNTER — Other Ambulatory Visit: Payer: Self-pay | Admitting: Pain Medicine

## 2016-03-29 ENCOUNTER — Other Ambulatory Visit: Payer: Self-pay | Admitting: Family Medicine

## 2016-04-02 DIAGNOSIS — N3000 Acute cystitis without hematuria: Secondary | ICD-10-CM | POA: Diagnosis not present

## 2016-04-03 ENCOUNTER — Other Ambulatory Visit: Payer: Self-pay

## 2016-04-03 DIAGNOSIS — N3000 Acute cystitis without hematuria: Secondary | ICD-10-CM | POA: Diagnosis not present

## 2016-04-03 DIAGNOSIS — B372 Candidiasis of skin and nail: Secondary | ICD-10-CM

## 2016-04-03 NOTE — Telephone Encounter (Signed)
Dr Carlota Raspberry, you Rxd this for pt in Feb for rash in pannus and groin (Candidal intertrigo). Do you want to give RFs, or pt RTC?

## 2016-04-04 DIAGNOSIS — N3000 Acute cystitis without hematuria: Secondary | ICD-10-CM | POA: Diagnosis not present

## 2016-04-07 MED ORDER — NYSTATIN 100000 UNIT/GM EX POWD: Freq: Three times a day (TID) | CUTANEOUS | 3 refills | Status: DC

## 2016-04-08 ENCOUNTER — Ambulatory Visit: Payer: Medicare Other | Attending: Pain Medicine | Admitting: Pain Medicine

## 2016-04-08 ENCOUNTER — Encounter: Payer: Self-pay | Admitting: Pain Medicine

## 2016-04-08 VITALS — BP 140/52 | HR 75 | Temp 98.4°F | Resp 16 | Ht 73.0 in | Wt 250.0 lb

## 2016-04-08 DIAGNOSIS — Z981 Arthrodesis status: Secondary | ICD-10-CM | POA: Diagnosis not present

## 2016-04-08 DIAGNOSIS — Z89519 Acquired absence of unspecified leg below knee: Secondary | ICD-10-CM | POA: Insufficient documentation

## 2016-04-08 DIAGNOSIS — M5124 Other intervertebral disc displacement, thoracic region: Secondary | ICD-10-CM | POA: Insufficient documentation

## 2016-04-08 DIAGNOSIS — G546 Phantom limb syndrome with pain: Secondary | ICD-10-CM | POA: Diagnosis not present

## 2016-04-08 DIAGNOSIS — M5126 Other intervertebral disc displacement, lumbar region: Secondary | ICD-10-CM | POA: Diagnosis not present

## 2016-04-08 DIAGNOSIS — M4806 Spinal stenosis, lumbar region: Secondary | ICD-10-CM | POA: Diagnosis not present

## 2016-04-08 DIAGNOSIS — N200 Calculus of kidney: Secondary | ICD-10-CM | POA: Insufficient documentation

## 2016-04-08 DIAGNOSIS — M545 Low back pain: Secondary | ICD-10-CM | POA: Diagnosis present

## 2016-04-08 DIAGNOSIS — M5134 Other intervertebral disc degeneration, thoracic region: Secondary | ICD-10-CM | POA: Insufficient documentation

## 2016-04-08 DIAGNOSIS — Z89511 Acquired absence of right leg below knee: Secondary | ICD-10-CM

## 2016-04-08 DIAGNOSIS — Z9889 Other specified postprocedural states: Secondary | ICD-10-CM | POA: Insufficient documentation

## 2016-04-08 DIAGNOSIS — M533 Sacrococcygeal disorders, not elsewhere classified: Secondary | ICD-10-CM | POA: Diagnosis not present

## 2016-04-08 DIAGNOSIS — M2578 Osteophyte, vertebrae: Secondary | ICD-10-CM | POA: Insufficient documentation

## 2016-04-08 DIAGNOSIS — M5136 Other intervertebral disc degeneration, lumbar region: Secondary | ICD-10-CM | POA: Insufficient documentation

## 2016-04-08 DIAGNOSIS — M62830 Muscle spasm of back: Secondary | ICD-10-CM | POA: Diagnosis not present

## 2016-04-08 DIAGNOSIS — M47816 Spondylosis without myelopathy or radiculopathy, lumbar region: Secondary | ICD-10-CM

## 2016-04-08 DIAGNOSIS — M791 Myalgia: Secondary | ICD-10-CM | POA: Diagnosis not present

## 2016-04-08 DIAGNOSIS — M79606 Pain in leg, unspecified: Secondary | ICD-10-CM | POA: Diagnosis present

## 2016-04-08 DIAGNOSIS — M5416 Radiculopathy, lumbar region: Secondary | ICD-10-CM | POA: Diagnosis not present

## 2016-04-08 DIAGNOSIS — G588 Other specified mononeuropathies: Secondary | ICD-10-CM | POA: Insufficient documentation

## 2016-04-08 DIAGNOSIS — N390 Urinary tract infection, site not specified: Secondary | ICD-10-CM | POA: Diagnosis not present

## 2016-04-08 DIAGNOSIS — M47817 Spondylosis without myelopathy or radiculopathy, lumbosacral region: Secondary | ICD-10-CM | POA: Diagnosis not present

## 2016-04-08 MED ORDER — OXYCODONE HCL 5 MG PO TABS: ORAL_TABLET | ORAL | 0 refills | Status: DC

## 2016-04-08 NOTE — Patient Instructions (Addendum)
PLAN   Continue present medication oxycodone. PLEASE NOTE that you have a 5 mg size oxycodone.  F/U PCP Dr. Alyson Ingles for evaliation of  BP,  UTI and general medical  condition. . Follow-up with Dr. Carlota Raspberry or Dr. Alyson Ingles. Patient will follow-up with Dr. Alyson Ingles for further evaluation at this time  F/U surgical evaluation. May consider further evaluation pending follow-up evaluations  F/U cardiologist as discussed  F/U neurological evaluation for questionable TIA versus CVA as previously discussed May consider PNCV/EMG studies and other studies pending follow-up evaluations  F/U urological evaluation status post urological surgery as discussed  May consider radiofrequency rhizolysis or intraspinal procedures pending response to present treatment and F/U evaluation.. We will avoid such procedures at this time as well as other aspects of treatment regimen  The patient will continue under the care of Dr. Carlota Raspberry his primary care physician as well as his urologist at this time.  The patient is to call pain management prior to scheduled return appointment for any concerns regarding condition
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
## 2016-04-11 ENCOUNTER — Other Ambulatory Visit: Payer: Self-pay | Admitting: Family Medicine

## 2016-04-17 ENCOUNTER — Ambulatory Visit (INDEPENDENT_AMBULATORY_CARE_PROVIDER_SITE_OTHER): Payer: Medicare Other | Admitting: Family Medicine

## 2016-04-17 ENCOUNTER — Encounter: Payer: Self-pay | Admitting: Family Medicine

## 2016-04-17 VITALS — BP 124/60 | HR 113 | Temp 98.1°F | Ht 73.0 in | Wt 272.6 lb

## 2016-04-17 DIAGNOSIS — B372 Candidiasis of skin and nail: Secondary | ICD-10-CM | POA: Diagnosis not present

## 2016-04-17 DIAGNOSIS — K219 Gastro-esophageal reflux disease without esophagitis: Secondary | ICD-10-CM

## 2016-04-17 DIAGNOSIS — I259 Chronic ischemic heart disease, unspecified: Secondary | ICD-10-CM

## 2016-04-17 DIAGNOSIS — Z87440 Personal history of urinary (tract) infections: Secondary | ICD-10-CM

## 2016-04-17 DIAGNOSIS — Z87448 Personal history of other diseases of urinary system: Secondary | ICD-10-CM

## 2016-04-17 DIAGNOSIS — Z79899 Other long term (current) drug therapy: Secondary | ICD-10-CM | POA: Diagnosis not present

## 2016-04-17 DIAGNOSIS — R110 Nausea: Secondary | ICD-10-CM | POA: Diagnosis not present

## 2016-04-17 DIAGNOSIS — G894 Chronic pain syndrome: Secondary | ICD-10-CM | POA: Diagnosis not present

## 2016-04-17 DIAGNOSIS — G4700 Insomnia, unspecified: Secondary | ICD-10-CM

## 2016-04-17 DIAGNOSIS — Z13220 Encounter for screening for lipoid disorders: Secondary | ICD-10-CM | POA: Diagnosis not present

## 2016-04-17 DIAGNOSIS — I10 Essential (primary) hypertension: Secondary | ICD-10-CM | POA: Diagnosis not present

## 2016-04-17 DIAGNOSIS — F32A Depression, unspecified: Secondary | ICD-10-CM

## 2016-04-17 DIAGNOSIS — F329 Major depressive disorder, single episode, unspecified: Secondary | ICD-10-CM

## 2016-04-17 LAB — COMPLETE METABOLIC PANEL WITH GFR
ALT: 15 U/L (ref 9–46)
AST: 15 U/L (ref 10–35)
Albumin: 3.9 g/dL (ref 3.6–5.1)
Alkaline Phosphatase: 158 U/L — ABNORMAL HIGH (ref 40–115)
BILIRUBIN TOTAL: 0.6 mg/dL (ref 0.2–1.2)
BUN: 11 mg/dL (ref 7–25)
CALCIUM: 10.2 mg/dL (ref 8.6–10.3)
CO2: 27 mmol/L (ref 20–31)
CREATININE: 0.86 mg/dL (ref 0.70–1.25)
Chloride: 105 mmol/L (ref 98–110)
Glucose, Bld: 147 mg/dL — ABNORMAL HIGH (ref 65–99)
Potassium: 4 mmol/L (ref 3.5–5.3)
Sodium: 140 mmol/L (ref 135–146)
TOTAL PROTEIN: 6.8 g/dL (ref 6.1–8.1)

## 2016-04-17 LAB — CBC
HCT: 36.5 % — ABNORMAL LOW (ref 38.5–50.0)
Hemoglobin: 12 g/dL — ABNORMAL LOW (ref 13.2–17.1)
MCH: 33.1 pg — ABNORMAL HIGH (ref 27.0–33.0)
MCHC: 32.9 g/dL (ref 32.0–36.0)
MCV: 100.6 fL — AB (ref 80.0–100.0)
MPV: 9.6 fL (ref 7.5–12.5)
PLATELETS: 320 10*3/uL (ref 140–400)
RBC: 3.63 MIL/uL — AB (ref 4.20–5.80)
RDW: 17.4 % — AB (ref 11.0–15.0)
WBC: 6.7 10*3/uL (ref 3.8–10.8)

## 2016-04-17 MED ORDER — DEXLANSOPRAZOLE 30 MG PO CPDR: DELAYED_RELEASE_CAPSULE | ORAL | 1 refills | Status: DC

## 2016-04-17 MED ORDER — FUROSEMIDE 40 MG PO TABS: 40.0000 mg | ORAL_TABLET | Freq: Every day | ORAL | 1 refills | Status: DC

## 2016-04-17 MED ORDER — PREGABALIN 100 MG PO CAPS: ORAL_CAPSULE | ORAL | 1 refills | Status: DC

## 2016-04-17 MED ORDER — TAMSULOSIN HCL 0.4 MG PO CAPS: 0.4000 mg | ORAL_CAPSULE | Freq: Every day | ORAL | 1 refills | Status: DC

## 2016-04-17 MED ORDER — DULOXETINE HCL 60 MG PO CPEP: 60.0000 mg | ORAL_CAPSULE | Freq: Every day | ORAL | 1 refills | Status: DC

## 2016-04-17 MED ORDER — ONDANSETRON 4 MG PO TBDP: 4.0000 mg | ORAL_TABLET | Freq: Three times a day (TID) | ORAL | 0 refills | Status: DC | PRN

## 2016-04-17 MED ORDER — ZOLPIDEM TARTRATE 5 MG PO TABS: 5.0000 mg | ORAL_TABLET | Freq: Every evening | ORAL | 1 refills | Status: DC | PRN

## 2016-04-17 MED ORDER — TRAZODONE HCL 100 MG PO TABS: ORAL_TABLET | ORAL | 1 refills | Status: DC

## 2016-04-17 MED ORDER — NYSTATIN 100000 UNIT/GM EX POWD: Freq: Three times a day (TID) | CUTANEOUS | 1 refills | Status: DC

## 2016-04-17 NOTE — Patient Instructions (Addendum)
  With your history of TIA, it was recommended you started Plavix blood thinner to help lessen chance of stroke. This was to be determined after your kidney procedures. Please discuss with your urologist whether or not you can start the Plavix at this time. Flomax can also be refilled by your urologist if needed.  Follow-up with your endocrinologist regarding insulin and insulin supplies. Blood here that your A1c has improved.  Continue Cymbalta and trazodone for depression and difficulty with sleep. If you need an additional medication help with sleep, can use Ambien sporadically, but be careful combining this with the trazodone.  I have already sent in a refill of the Phenergan, but I also sent in Zofran for you today. Use one or the other if needed for nausea and vomiting. If you have persistent difficulty or increase in nausea/vomiting, call Dr. Benson Norway for follow-up.  Nystatin powder if needed for fungal appearing rash. Return if that does not improve your symptoms.  I refilled her Lyrica, but this can be discussed with her pain management specialist next visit.  Return to the clinic or go to the nearest emergency room if any of your symptoms worsen or new symptoms occur.   IF you received an x-ray today, you will receive an invoice from [facility]. Please contact [facility] at [phone] with questions or concerns regarding your invoice.   IF you received labwork today, you will receive an invoice from [employer]. Please contact [facility] at [phone] with questions or concerns regarding your invoice.   Our billing staff will not be able to assist you with questions regarding bills from these companies.  You will be contacted with the lab results as soon as they are available. The fastest way to get your results is to activate your My Chart account. Instructions are located on the last page of this paperwork. If you have not heard from [place]  regarding the results in 2 weeks, please contact this office.
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
## 2016-04-18 ENCOUNTER — Encounter: Payer: Self-pay | Admitting: Family Medicine

## 2016-04-22 ENCOUNTER — Inpatient Hospital Stay (HOSPITAL_COMMUNITY)
Admission: EM | Admit: 2016-04-22 | Discharge: 2016-04-28 | DRG: 872 | Disposition: A | Discharge status: Home / Self Care | Payer: Medicare Other | Attending: Internal Medicine | Admitting: Internal Medicine

## 2016-04-22 ENCOUNTER — Encounter (HOSPITAL_COMMUNITY): Payer: Self-pay | Admitting: Emergency Medicine

## 2016-04-22 ENCOUNTER — Emergency Department (HOSPITAL_COMMUNITY): Payer: Medicare Other

## 2016-04-22 DIAGNOSIS — N390 Urinary tract infection, site not specified: Secondary | ICD-10-CM | POA: Diagnosis present

## 2016-04-22 DIAGNOSIS — Z882 Allergy status to sulfonamides status: Secondary | ICD-10-CM

## 2016-04-22 DIAGNOSIS — E1169 Type 2 diabetes mellitus with other specified complication: Secondary | ICD-10-CM | POA: Diagnosis present

## 2016-04-22 DIAGNOSIS — E860 Dehydration: Secondary | ICD-10-CM | POA: Diagnosis present

## 2016-04-22 DIAGNOSIS — D649 Anemia, unspecified: Secondary | ICD-10-CM | POA: Diagnosis present

## 2016-04-22 DIAGNOSIS — R7889 Finding of other specified substances, not normally found in blood: Secondary | ICD-10-CM | POA: Diagnosis not present

## 2016-04-22 DIAGNOSIS — E1165 Type 2 diabetes mellitus with hyperglycemia: Secondary | ICD-10-CM | POA: Diagnosis present

## 2016-04-22 DIAGNOSIS — N3001 Acute cystitis with hematuria: Secondary | ICD-10-CM | POA: Diagnosis not present

## 2016-04-22 DIAGNOSIS — M549 Dorsalgia, unspecified: Secondary | ICD-10-CM

## 2016-04-22 DIAGNOSIS — Z903 Acquired absence of stomach [part of]: Secondary | ICD-10-CM

## 2016-04-22 DIAGNOSIS — Z1612 Extended spectrum beta lactamase (ESBL) resistance: Secondary | ICD-10-CM

## 2016-04-22 DIAGNOSIS — Z79899 Other long term (current) drug therapy: Secondary | ICD-10-CM

## 2016-04-22 DIAGNOSIS — Z8673 Personal history of transient ischemic attack (TIA), and cerebral infarction without residual deficits: Secondary | ICD-10-CM

## 2016-04-22 DIAGNOSIS — A419 Sepsis, unspecified organism: Principal | ICD-10-CM | POA: Diagnosis present

## 2016-04-22 DIAGNOSIS — K5900 Constipation, unspecified: Secondary | ICD-10-CM | POA: Diagnosis not present

## 2016-04-22 DIAGNOSIS — A499 Bacterial infection, unspecified: Secondary | ICD-10-CM

## 2016-04-22 DIAGNOSIS — E669 Obesity, unspecified: Secondary | ICD-10-CM

## 2016-04-22 DIAGNOSIS — R079 Chest pain, unspecified: Secondary | ICD-10-CM | POA: Diagnosis not present

## 2016-04-22 DIAGNOSIS — B372 Candidiasis of skin and nail: Secondary | ICD-10-CM

## 2016-04-22 DIAGNOSIS — Z885 Allergy status to narcotic agent status: Secondary | ICD-10-CM

## 2016-04-22 DIAGNOSIS — E871 Hypo-osmolality and hyponatremia: Secondary | ICD-10-CM | POA: Diagnosis present

## 2016-04-22 DIAGNOSIS — I2510 Atherosclerotic heart disease of native coronary artery without angina pectoris: Secondary | ICD-10-CM | POA: Diagnosis present

## 2016-04-22 DIAGNOSIS — N3000 Acute cystitis without hematuria: Secondary | ICD-10-CM

## 2016-04-22 DIAGNOSIS — G8929 Other chronic pain: Secondary | ICD-10-CM | POA: Diagnosis present

## 2016-04-22 DIAGNOSIS — Z7982 Long term (current) use of aspirin: Secondary | ICD-10-CM

## 2016-04-22 DIAGNOSIS — Z794 Long term (current) use of insulin: Secondary | ICD-10-CM

## 2016-04-22 DIAGNOSIS — Z888 Allergy status to other drugs, medicaments and biological substances status: Secondary | ICD-10-CM

## 2016-04-22 DIAGNOSIS — N200 Calculus of kidney: Secondary | ICD-10-CM | POA: Diagnosis present

## 2016-04-22 DIAGNOSIS — R1084 Generalized abdominal pain: Secondary | ICD-10-CM | POA: Diagnosis not present

## 2016-04-22 DIAGNOSIS — Z87891 Personal history of nicotine dependence: Secondary | ICD-10-CM

## 2016-04-22 DIAGNOSIS — R739 Hyperglycemia, unspecified: Secondary | ICD-10-CM | POA: Diagnosis not present

## 2016-04-22 DIAGNOSIS — Z89511 Acquired absence of right leg below knee: Secondary | ICD-10-CM

## 2016-04-22 DIAGNOSIS — R509 Fever, unspecified: Secondary | ICD-10-CM

## 2016-04-22 LAB — URINALYSIS, ROUTINE W REFLEX MICROSCOPIC
Bilirubin Urine: NEGATIVE
KETONES UR: 15 mg/dL — AB
NITRITE: NEGATIVE
PH: 6 (ref 5.0–8.0)
PROTEIN: 100 mg/dL — AB
Specific Gravity, Urine: 1.025 (ref 1.005–1.030)

## 2016-04-22 LAB — I-STAT CG4 LACTIC ACID, ED: Lactic Acid, Venous: 1.95 mmol/L (ref 0.5–1.9)

## 2016-04-22 LAB — CBG MONITORING, ED: GLUCOSE-CAPILLARY: 317 mg/dL — AB (ref 65–99)

## 2016-04-22 LAB — CBC WITH DIFFERENTIAL/PLATELET
BASOS ABS: 0 10*3/uL (ref 0.0–0.1)
BASOS PCT: 0 %
Eosinophils Absolute: 0 10*3/uL (ref 0.0–0.7)
Eosinophils Relative: 0 %
HEMATOCRIT: 36.2 % — AB (ref 39.0–52.0)
Hemoglobin: 12.2 g/dL — ABNORMAL LOW (ref 13.0–17.0)
LYMPHS PCT: 9 %
Lymphs Abs: 1.2 10*3/uL (ref 0.7–4.0)
MCH: 33.2 pg (ref 26.0–34.0)
MCHC: 33.7 g/dL (ref 30.0–36.0)
MCV: 98.6 fL (ref 78.0–100.0)
Monocytes Absolute: 1 10*3/uL (ref 0.1–1.0)
Monocytes Relative: 7 %
NEUTROS ABS: 11 10*3/uL — AB (ref 1.7–7.7)
NEUTROS PCT: 84 %
PLATELETS: 296 10*3/uL (ref 150–400)
RBC: 3.67 MIL/uL — AB (ref 4.22–5.81)
RDW: 16.4 % — ABNORMAL HIGH (ref 11.5–15.5)
WBC: 13.1 10*3/uL — AB (ref 4.0–10.5)

## 2016-04-22 LAB — I-STAT TROPONIN, ED: TROPONIN I, POC: 0 ng/mL (ref 0.00–0.08)

## 2016-04-22 LAB — COMPREHENSIVE METABOLIC PANEL
ALBUMIN: 3.6 g/dL (ref 3.5–5.0)
ALK PHOS: 131 U/L — AB (ref 38–126)
ALT: 19 U/L (ref 17–63)
ANION GAP: 9 (ref 5–15)
AST: 23 U/L (ref 15–41)
BILIRUBIN TOTAL: 1.7 mg/dL — AB (ref 0.3–1.2)
BUN: 12 mg/dL (ref 6–20)
CALCIUM: 9.7 mg/dL (ref 8.9–10.3)
CO2: 21 mmol/L — ABNORMAL LOW (ref 22–32)
Chloride: 98 mmol/L — ABNORMAL LOW (ref 101–111)
Creatinine, Ser: 1.06 mg/dL (ref 0.61–1.24)
Glucose, Bld: 296 mg/dL — ABNORMAL HIGH (ref 65–99)
POTASSIUM: 3.6 mmol/L (ref 3.5–5.1)
Sodium: 128 mmol/L — ABNORMAL LOW (ref 135–145)
TOTAL PROTEIN: 6.8 g/dL (ref 6.5–8.1)

## 2016-04-22 LAB — LIPASE, BLOOD: Lipase: 13 U/L (ref 11–51)

## 2016-04-22 LAB — URINE MICROSCOPIC-ADD ON

## 2016-04-22 MED ORDER — VANCOMYCIN HCL 10 G IV SOLR
2500.0000 mg | Freq: Once | INTRAVENOUS | Status: AC
  Administered 2016-04-22: 2500 mg via INTRAVENOUS
  Filled 2016-04-22: qty 2500

## 2016-04-22 MED ORDER — SODIUM CHLORIDE 0.9 % IV BOLUS (SEPSIS)
30.0000 mL/kg | Freq: Once | INTRAVENOUS | Status: AC
  Administered 2016-04-22: 3702 mL via INTRAVENOUS

## 2016-04-22 MED ORDER — VANCOMYCIN HCL IN DEXTROSE 1-5 GM/200ML-% IV SOLN: 1000.0000 mg | Freq: Once | INTRAVENOUS | Status: DC

## 2016-04-22 MED ORDER — PIPERACILLIN-TAZOBACTAM 3.375 G IVPB 30 MIN
3.3750 g | Freq: Once | INTRAVENOUS | Status: AC
  Administered 2016-04-22: 3.375 g via INTRAVENOUS
  Filled 2016-04-22: qty 50

## 2016-04-22 MED ORDER — FENTANYL CITRATE (PF) 100 MCG/2ML IJ SOLN
50.0000 ug | Freq: Once | INTRAMUSCULAR | Status: AC
  Administered 2016-04-22: 50 ug via INTRAVENOUS
  Filled 2016-04-22: qty 2

## 2016-04-22 MED ORDER — INSULIN ASPART 100 UNIT/ML ~~LOC~~ SOLN
10.0000 [IU] | Freq: Once | SUBCUTANEOUS | Status: AC
  Administered 2016-04-22: 10 [IU] via INTRAVENOUS
  Filled 2016-04-22: qty 1

## 2016-04-22 MED ORDER — IOPAMIDOL (ISOVUE-300) INJECTION 61%
INTRAVENOUS | Status: AC
  Administered 2016-04-22: 100 mL
  Filled 2016-04-22: qty 100

## 2016-04-22 NOTE — ED Triage Notes (Signed)
Per EMS, pt's has been "ill"  Wife states CBG's have been very high (over 500 this morning), fever for several days, altered mental status today.  Enroute he began to complain of chest pain.  He was given 324 ASA, 1 nitro which decreased his pain slightly.  He does have a lower right knee amputation and extensive abdominal issues.  Family reports he is unable to lay flat b/c "his stomach is in his chest"
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
## 2016-04-22 NOTE — ED Notes (Signed)
Patient transported to CT 

## 2016-04-22 NOTE — ED Notes (Signed)
Paged [facility] to activate a Code Sepsis 

## 2016-04-23 ENCOUNTER — Encounter (HOSPITAL_COMMUNITY): Payer: Self-pay | Admitting: Internal Medicine

## 2016-04-23 DIAGNOSIS — A499 Bacterial infection, unspecified: Secondary | ICD-10-CM | POA: Diagnosis not present

## 2016-04-23 DIAGNOSIS — D649 Anemia, unspecified: Secondary | ICD-10-CM | POA: Diagnosis present

## 2016-04-23 DIAGNOSIS — Z794 Long term (current) use of insulin: Secondary | ICD-10-CM

## 2016-04-23 DIAGNOSIS — Z885 Allergy status to narcotic agent status: Secondary | ICD-10-CM | POA: Diagnosis not present

## 2016-04-23 DIAGNOSIS — N200 Calculus of kidney: Secondary | ICD-10-CM | POA: Diagnosis not present

## 2016-04-23 DIAGNOSIS — A419 Sepsis, unspecified organism: Secondary | ICD-10-CM | POA: Diagnosis not present

## 2016-04-23 DIAGNOSIS — N390 Urinary tract infection, site not specified: Secondary | ICD-10-CM | POA: Diagnosis not present

## 2016-04-23 DIAGNOSIS — N3001 Acute cystitis with hematuria: Secondary | ICD-10-CM | POA: Diagnosis not present

## 2016-04-23 DIAGNOSIS — Z888 Allergy status to other drugs, medicaments and biological substances status: Secondary | ICD-10-CM | POA: Diagnosis not present

## 2016-04-23 DIAGNOSIS — Z7982 Long term (current) use of aspirin: Secondary | ICD-10-CM | POA: Diagnosis not present

## 2016-04-23 DIAGNOSIS — Z89511 Acquired absence of right leg below knee: Secondary | ICD-10-CM | POA: Diagnosis not present

## 2016-04-23 DIAGNOSIS — I2510 Atherosclerotic heart disease of native coronary artery without angina pectoris: Secondary | ICD-10-CM | POA: Diagnosis present

## 2016-04-23 DIAGNOSIS — Z8673 Personal history of transient ischemic attack (TIA), and cerebral infarction without residual deficits: Secondary | ICD-10-CM | POA: Diagnosis not present

## 2016-04-23 DIAGNOSIS — Z79899 Other long term (current) drug therapy: Secondary | ICD-10-CM | POA: Diagnosis not present

## 2016-04-23 DIAGNOSIS — A4151 Sepsis due to Escherichia coli [E. coli]: Secondary | ICD-10-CM | POA: Diagnosis not present

## 2016-04-23 DIAGNOSIS — Z882 Allergy status to sulfonamides status: Secondary | ICD-10-CM | POA: Diagnosis not present

## 2016-04-23 DIAGNOSIS — K5900 Constipation, unspecified: Secondary | ICD-10-CM | POA: Diagnosis not present

## 2016-04-23 DIAGNOSIS — E1165 Type 2 diabetes mellitus with hyperglycemia: Secondary | ICD-10-CM | POA: Diagnosis not present

## 2016-04-23 DIAGNOSIS — N3000 Acute cystitis without hematuria: Secondary | ICD-10-CM | POA: Diagnosis not present

## 2016-04-23 DIAGNOSIS — Z1612 Extended spectrum beta lactamase (ESBL) resistance: Secondary | ICD-10-CM | POA: Diagnosis not present

## 2016-04-23 DIAGNOSIS — Z903 Acquired absence of stomach [part of]: Secondary | ICD-10-CM | POA: Diagnosis not present

## 2016-04-23 DIAGNOSIS — E860 Dehydration: Secondary | ICD-10-CM | POA: Diagnosis present

## 2016-04-23 DIAGNOSIS — G8929 Other chronic pain: Secondary | ICD-10-CM | POA: Diagnosis present

## 2016-04-23 DIAGNOSIS — Z87891 Personal history of nicotine dependence: Secondary | ICD-10-CM | POA: Diagnosis not present

## 2016-04-23 DIAGNOSIS — B9689 Other specified bacterial agents as the cause of diseases classified elsewhere: Secondary | ICD-10-CM | POA: Diagnosis not present

## 2016-04-23 DIAGNOSIS — E871 Hypo-osmolality and hyponatremia: Secondary | ICD-10-CM | POA: Diagnosis present

## 2016-04-23 LAB — COMPREHENSIVE METABOLIC PANEL
ALK PHOS: 110 U/L (ref 38–126)
ALT: 16 U/L — AB (ref 17–63)
ANION GAP: 10 (ref 5–15)
AST: 19 U/L (ref 15–41)
Albumin: 3 g/dL — ABNORMAL LOW (ref 3.5–5.0)
BILIRUBIN TOTAL: 1.1 mg/dL (ref 0.3–1.2)
BUN: 10 mg/dL (ref 6–20)
CALCIUM: 9.2 mg/dL (ref 8.9–10.3)
CO2: 21 mmol/L — ABNORMAL LOW (ref 22–32)
CREATININE: 0.92 mg/dL (ref 0.61–1.24)
Chloride: 102 mmol/L (ref 101–111)
Glucose, Bld: 314 mg/dL — ABNORMAL HIGH (ref 65–99)
Potassium: 4 mmol/L (ref 3.5–5.1)
Sodium: 133 mmol/L — ABNORMAL LOW (ref 135–145)
TOTAL PROTEIN: 6 g/dL — AB (ref 6.5–8.1)

## 2016-04-23 LAB — GLUCOSE, CAPILLARY
GLUCOSE-CAPILLARY: 219 mg/dL — AB (ref 65–99)
GLUCOSE-CAPILLARY: 306 mg/dL — AB (ref 65–99)
GLUCOSE-CAPILLARY: 430 mg/dL — AB (ref 65–99)
GLUCOSE-CAPILLARY: 295 mg/dL — AB (ref 65–99)
Glucose-Capillary: 343 mg/dL — ABNORMAL HIGH (ref 65–99)
Glucose-Capillary: 123 mg/dL — ABNORMAL HIGH (ref 65–99)

## 2016-04-23 LAB — CG4 I-STAT (LACTIC ACID): LACTIC ACID, VENOUS: 1.39 mmol/L (ref 0.5–1.9)

## 2016-04-23 LAB — CBC WITH DIFFERENTIAL/PLATELET
Basophils Absolute: 0 10*3/uL (ref 0.0–0.1)
Basophils Relative: 0 %
EOS ABS: 0 10*3/uL (ref 0.0–0.7)
EOS PCT: 0 %
HCT: 32.6 % — ABNORMAL LOW (ref 39.0–52.0)
HEMOGLOBIN: 10.8 g/dL — AB (ref 13.0–17.0)
LYMPHS ABS: 1.5 10*3/uL (ref 0.7–4.0)
LYMPHS PCT: 15 %
MCH: 33.5 pg (ref 26.0–34.0)
MCHC: 33.1 g/dL (ref 30.0–36.0)
MCV: 101.2 fL — AB (ref 78.0–100.0)
MONOS PCT: 12 %
Monocytes Absolute: 1.2 10*3/uL — ABNORMAL HIGH (ref 0.1–1.0)
NEUTROS PCT: 73 %
Neutro Abs: 7.5 10*3/uL (ref 1.7–7.7)
Platelets: 256 10*3/uL (ref 150–400)
RBC: 3.22 MIL/uL — AB (ref 4.22–5.81)
RDW: 17.1 % — ABNORMAL HIGH (ref 11.5–15.5)
WBC: 10.2 10*3/uL (ref 4.0–10.5)

## 2016-04-23 LAB — PROCALCITONIN: PROCALCITONIN: 1.42 ng/mL

## 2016-04-23 LAB — LACTIC ACID, PLASMA: LACTIC ACID, VENOUS: 1.2 mmol/L (ref 0.5–1.9)

## 2016-04-23 MED ORDER — ACETAMINOPHEN 500 MG PO TABS
ORAL_TABLET | ORAL | Status: AC
  Filled 2016-04-23: qty 2

## 2016-04-23 MED ORDER — INSULIN REGULAR HUMAN (CONC) 500 UNIT/ML ~~LOC~~ SOPN
0.0000 [IU] | PEN_INJECTOR | Freq: Every day | SUBCUTANEOUS | Status: DC
  Administered 2016-04-23: 90 [IU] via SUBCUTANEOUS
  Administered 2016-04-24: 35 [IU] via SUBCUTANEOUS
  Administered 2016-04-25: 75 [IU] via SUBCUTANEOUS
  Administered 2016-04-27 – 2016-04-28 (×2): 70 [IU] via SUBCUTANEOUS
  Filled 2016-04-23: qty 3

## 2016-04-23 MED ORDER — LORAZEPAM 0.5 MG PO TABS: 0.5000 mg | ORAL_TABLET | Freq: Two times a day (BID) | ORAL | Status: DC | PRN

## 2016-04-23 MED ORDER — DOCUSATE SODIUM 100 MG PO CAPS
200.0000 mg | ORAL_CAPSULE | Freq: Every day | ORAL | Status: DC
  Administered 2016-04-23 – 2016-04-28 (×6): 200 mg via ORAL
  Filled 2016-04-23 (×6): qty 2

## 2016-04-23 MED ORDER — ZOLPIDEM TARTRATE 5 MG PO TABS
5.0000 mg | ORAL_TABLET | Freq: Every evening | ORAL | Status: DC | PRN
  Administered 2016-04-26: 5 mg via ORAL
  Filled 2016-04-23: qty 1

## 2016-04-23 MED ORDER — ONDANSETRON HCL 4 MG PO TABS: 4.0000 mg | ORAL_TABLET | Freq: Four times a day (QID) | ORAL | Status: DC | PRN

## 2016-04-23 MED ORDER — INSULIN REGULAR HUMAN (CONC) 500 UNIT/ML ~~LOC~~ SOPN
0.0000 [IU] | PEN_INJECTOR | Freq: Every day | SUBCUTANEOUS | Status: DC
  Administered 2016-04-23: 80 [IU] via SUBCUTANEOUS
  Administered 2016-04-24: 45 [IU] via SUBCUTANEOUS

## 2016-04-23 MED ORDER — ONDANSETRON HCL 4 MG/2ML IJ SOLN
INTRAMUSCULAR | Status: AC
  Filled 2016-04-23: qty 4

## 2016-04-23 MED ORDER — ACETAMINOPHEN 325 MG PO TABS
650.0000 mg | ORAL_TABLET | Freq: Four times a day (QID) | ORAL | Status: DC | PRN
  Administered 2016-04-23 – 2016-04-26 (×8): 650 mg via ORAL
  Filled 2016-04-23 (×8): qty 2

## 2016-04-23 MED ORDER — INSULIN REGULAR HUMAN (CONC) 500 UNIT/ML ~~LOC~~ SOLN: 0.0000 [IU] | Freq: Every day | SUBCUTANEOUS | Status: DC

## 2016-04-23 MED ORDER — INSULIN REGULAR HUMAN (CONC) 500 UNIT/ML ~~LOC~~ SOPN: 25.0000 [IU] | PEN_INJECTOR | Freq: Three times a day (TID) | SUBCUTANEOUS | Status: DC

## 2016-04-23 MED ORDER — OXYCODONE HCL 5 MG PO TABS
ORAL_TABLET | ORAL | Status: AC
  Filled 2016-04-23: qty 3

## 2016-04-23 MED ORDER — SODIUM CHLORIDE 0.9 % IV SOLN
8.0000 mg | Freq: Once | INTRAVENOUS | Status: AC
  Administered 2016-04-23: 8 mg via INTRAVENOUS
  Filled 2016-04-23: qty 4

## 2016-04-23 MED ORDER — INSULIN REGULAR HUMAN (CONC) 500 UNIT/ML ~~LOC~~ SOLN
0.0000 [IU] | Freq: Every day | SUBCUTANEOUS | Status: DC
  Filled 2016-04-23: qty 20

## 2016-04-23 MED ORDER — PREGABALIN 100 MG PO CAPS
100.0000 mg | ORAL_CAPSULE | Freq: Three times a day (TID) | ORAL | Status: DC
  Administered 2016-04-23 – 2016-04-28 (×16): 100 mg via ORAL
  Filled 2016-04-23 (×16): qty 1

## 2016-04-23 MED ORDER — PANTOPRAZOLE SODIUM 40 MG PO TBEC
40.0000 mg | DELAYED_RELEASE_TABLET | Freq: Every day | ORAL | Status: DC
  Administered 2016-04-23 – 2016-04-28 (×6): 40 mg via ORAL
  Filled 2016-04-23 (×6): qty 1

## 2016-04-23 MED ORDER — TAMSULOSIN HCL 0.4 MG PO CAPS
0.4000 mg | ORAL_CAPSULE | Freq: Every day | ORAL | Status: DC
  Administered 2016-04-23 – 2016-04-27 (×5): 0.4 mg via ORAL
  Filled 2016-04-23 (×5): qty 1

## 2016-04-23 MED ORDER — ENOXAPARIN SODIUM 40 MG/0.4ML ~~LOC~~ SOLN
40.0000 mg | Freq: Every day | SUBCUTANEOUS | Status: DC
  Administered 2016-04-23 – 2016-04-28 (×6): 40 mg via SUBCUTANEOUS
  Filled 2016-04-23 (×6): qty 0.4

## 2016-04-23 MED ORDER — PROMETHAZINE HCL 25 MG PO TABS
25.0000 mg | ORAL_TABLET | Freq: Three times a day (TID) | ORAL | Status: DC | PRN
Start: 2016-04-23 — End: 2016-04-28

## 2016-04-23 MED ORDER — DULOXETINE HCL 60 MG PO CPEP
60.0000 mg | ORAL_CAPSULE | Freq: Every day | ORAL | Status: DC
  Administered 2016-04-23 – 2016-04-28 (×6): 60 mg via ORAL
  Filled 2016-04-23 (×6): qty 1

## 2016-04-23 MED ORDER — ACETAMINOPHEN 650 MG RE SUPP: 650.0000 mg | Freq: Four times a day (QID) | RECTAL | Status: DC | PRN

## 2016-04-23 MED ORDER — VANCOMYCIN HCL 10 G IV SOLR
1250.0000 mg | Freq: Two times a day (BID) | INTRAVENOUS | Status: DC
  Administered 2016-04-23 – 2016-04-25 (×4): 1250 mg via INTRAVENOUS
  Filled 2016-04-23 (×5): qty 1250

## 2016-04-23 MED ORDER — TRAZODONE HCL 100 MG PO TABS
100.0000 mg | ORAL_TABLET | Freq: Every day | ORAL | Status: DC
Start: 2016-04-23 — End: 2016-04-28
  Administered 2016-04-23 – 2016-04-27 (×5): 100 mg via ORAL
  Filled 2016-04-23 (×5): qty 1

## 2016-04-23 MED ORDER — ASPIRIN EC 81 MG PO TBEC
81.0000 mg | DELAYED_RELEASE_TABLET | Freq: Every morning | ORAL | Status: DC
  Administered 2016-04-23 – 2016-04-28 (×6): 81 mg via ORAL
  Filled 2016-04-23 (×6): qty 1

## 2016-04-23 MED ORDER — SODIUM CHLORIDE 0.9 % IV SOLN
INTRAVENOUS | Status: AC
  Administered 2016-04-23: 05:00:00 via INTRAVENOUS

## 2016-04-23 MED ORDER — SODIUM CHLORIDE 0.9 % IV SOLN
500.0000 mg | Freq: Four times a day (QID) | INTRAVENOUS | Status: DC
  Administered 2016-04-23 – 2016-04-25 (×9): 500 mg via INTRAVENOUS
  Filled 2016-04-23 (×11): qty 500

## 2016-04-23 MED ORDER — OXYCODONE HCL 5 MG PO TABS
15.0000 mg | ORAL_TABLET | Freq: Three times a day (TID) | ORAL | Status: DC
  Administered 2016-04-23 – 2016-04-28 (×17): 15 mg via ORAL
  Filled 2016-04-23 (×17): qty 3

## 2016-04-23 MED ORDER — ONDANSETRON HCL 4 MG/2ML IJ SOLN
4.0000 mg | Freq: Four times a day (QID) | INTRAMUSCULAR | Status: DC | PRN
Start: 2016-04-23 — End: 2016-04-28
  Administered 2016-04-25 – 2016-04-27 (×4): 4 mg via INTRAVENOUS
  Filled 2016-04-23 (×5): qty 2

## 2016-04-23 MED ORDER — INSULIN REGULAR HUMAN (CONC) 500 UNIT/ML ~~LOC~~ SOPN
0.0000 [IU] | PEN_INJECTOR | Freq: Every day | SUBCUTANEOUS | Status: DC
  Administered 2016-04-23: 65 [IU] via SUBCUTANEOUS
  Administered 2016-04-25: 60 [IU] via SUBCUTANEOUS
  Administered 2016-04-26: 75 [IU] via SUBCUTANEOUS
  Administered 2016-04-27: 55 [IU] via SUBCUTANEOUS

## 2016-04-23 MED ORDER — OFLOXACIN 0.3 % OP SOLN
1.0000 [drp] | Freq: Four times a day (QID) | OPHTHALMIC | Status: AC
  Administered 2016-04-23 – 2016-04-27 (×17): 1 [drp] via OPHTHALMIC
  Filled 2016-04-23: qty 5
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
## 2016-04-23 NOTE — Progress Notes (Signed)
Pharmacy Antibiotic Note  [name] is a 65 y.o. male admitted on 04/22/2016 with UTI/sepsis.  Pharmacy has been consulted for Primaxin dosing (h/o ESBL Ecoli in urine). Also adding vancomycin -SCr= 0.92, CrCl > 100  Pt received Vanc 2.5gm ED on 8/8 ~2030  Plan: -Vancomycin 1250mg  IV q12h -Will follow renal function, cultures and clinical progress   Height: 6\' 1"  (185.4 cm) Weight: 272 lb (123.4 kg) IBW/kg (Calculated) : 79.9  Temp (24hrs), Avg:100.3 F (37.9 C), Min:99.4 F (37.4 C), Max:101.1 F (38.4 C)   Recent Labs Lab 04/17/16 0929 04/17/16 0931 04/22/16 2020 04/22/16 2043 04/23/16 0206 04/23/16 0726  WBC  --  6.7 13.1*  --   --  10.2  CREATININE 0.86  --  1.06  --   --  0.92  LATICACIDVEN  --   --   --  1.95* 1.39 1.2    Estimated Creatinine Clearance: 113.1 mL/min (by C-G formula based on SCr of 0.92 mg/dL).    Allergies  Allergen Reactions  . Symbicort [Budesonide-Formoterol Fumarate] Other (See Comments)    Hyperglycemia   . Advair Diskus [Fluticasone-Salmeterol] Other (See Comments)    hyperglycemia  . Hydromorphone Nausea And Vomiting    Itching   . Morphine Itching  . Soap Itching and Other (See Comments)    Other Reaction: ivory soap=itching     Antimicrobials this admission: 8/8 Vanc x1 8/8 Zosyn x 1 8/9 Primaxin >>   Microbiology results: 8/8 BCx x2:  8/8 UCx:    Thank you for allowing pharmacy to be a part of this patient's care.  Sherlon Handing, PharmD, BCPS Clinical pharmacist, pager [phone] 04/23/2016 10:55 AM

## 2016-04-23 NOTE — Progress Notes (Signed)
Pharmacy Antibiotic Note  [name] is a 65 y.o. male admitted on 04/22/2016 with UTI.  Pharmacy has been consulted for Primaxin dosing. Noted pt with h/o ESBL Ecoli in urine.  Pt received Vanc 2.5gm and Zosyn 3.375gm IV in ED ~2030  Plan: Primaxin 500mg  IV q6h Will f/u renal function, micro data, and pt's clinical condition   Height: 6\' 1"  (185.4 cm) Weight: 272 lb (123.4 kg) IBW/kg (Calculated) : 79.9  Temp (24hrs), Avg:99.4 F (37.4 C), Min:99.4 F (37.4 C), Max:99.4 F (37.4 C)   Recent Labs Lab 04/17/16 0929 04/17/16 0931 04/22/16 2020 04/22/16 2043 04/23/16 0206  WBC  --  6.7 13.1*  --   --   CREATININE 0.86  --  1.06  --   --   LATICACIDVEN  --   --   --  1.95* 1.39    Estimated Creatinine Clearance: 98.2 mL/min (by C-G formula based on SCr of 1.06 mg/dL).    Allergies  Allergen Reactions  . Symbicort [Budesonide-Formoterol Fumarate] Other (See Comments)    Hyperglycemia   . Advair Diskus [Fluticasone-Salmeterol] Other (See Comments)    hyperglycemia  . Hydromorphone Nausea And Vomiting    Itching   . Morphine Itching  . Soap Itching and Other (See Comments)    Other Reaction: ivory soap=itching     Antimicrobials this admission: 8/8 Vanc x1 8/8 Zosyn x 1 8/9 Primaxin >>   Microbiology results: 8/8 BCx x2:  8/8 UCx:    Thank you for allowing pharmacy to be a part of this patient's care.  Sherlon Handing, PharmD, BCPS Clinical pharmacist, pager [phone] 04/23/2016 4:45 AM

## 2016-04-23 NOTE — Progress Notes (Signed)
Inpatient Diabetes Program Recommendations  AACE/ADA: New Consensus Statement on Inpatient Glycemic Control (2015)  Target Ranges:  Prepandial:   less than 140 mg/dL      Peak postprandial:   less than 180 mg/dL (1-2 hours)      Critically ill patients:  140 - 180 mg/dL   Results for [name], [name] (MRN [id number]) as of 04/23/2016 15:08  Ref. Range 04/22/2016 20:08 04/22/2016 21:57 04/23/2016 03:35 04/23/2016 06:35 04/23/2016 10:34  Glucose-Capillary Latest Ref Range: 65 - 99 mg/dL 317 (H) 219 (H) 343 (H) 306 (H) 430 (H)    Inpatient Diabetes Program Recommendations:  Spoke with patient regarding elevated CBGs. Patient states he normally runs high CBGs @ home and drinks soft drinks. Patient has soft drinks @ bedside and has been drinking today. Reviewed with patient sugar content in soft drinks and asked to limit any drinks other than sugar free. Patient shared that his doctors recommended that he not drink sugar free. Patient acknowledged understanding of information reviewed.  Thank you, Nani Gasser. Anyssa Sharpless, RN, MSN, CDE Inpatient Glycemic Control Team Team Pager [phone] (8am-5pm) 04/23/2016 3:15 PM

## 2016-04-23 NOTE — ED Provider Notes (Signed)
.   Please see previous physicians note regarding patient's presenting history and physical, initial ED course, and associated medical decision making. 65 year old male with history of DM, HTN, HLD with 3 days fever and confusion. With sepsis 2/2 UTI. CT abd/pelvis pending to r/o concurrent stone. CT w/ bladder thickening, no stone. Covered previously w/ vanc and zosyn. Discussed with Dr. Hal Hope. Will admit to telemetry for ongoing treatment.   Forde Dandy, MD 04/23/16 [phone]

## 2016-04-24 LAB — GLUCOSE, CAPILLARY
GLUCOSE-CAPILLARY: 81 mg/dL (ref 65–99)
GLUCOSE-CAPILLARY: 63 mg/dL — AB (ref 65–99)
GLUCOSE-CAPILLARY: 155 mg/dL — AB (ref 65–99)
Glucose-Capillary: 94 mg/dL (ref 65–99)
Glucose-Capillary: 66 mg/dL (ref 65–99)
Glucose-Capillary: 133 mg/dL — ABNORMAL HIGH (ref 65–99)

## 2016-04-24 LAB — COMPREHENSIVE METABOLIC PANEL
ALBUMIN: 2.8 g/dL — AB (ref 3.5–5.0)
ALT: 17 U/L (ref 17–63)
ANION GAP: 5 (ref 5–15)
AST: 25 U/L (ref 15–41)
Alkaline Phosphatase: 85 U/L (ref 38–126)
BILIRUBIN TOTAL: 0.5 mg/dL (ref 0.3–1.2)
BUN: 9 mg/dL (ref 6–20)
CHLORIDE: 106 mmol/L (ref 101–111)
CO2: 24 mmol/L (ref 22–32)
Calcium: 9.3 mg/dL (ref 8.9–10.3)
Creatinine, Ser: 0.88 mg/dL (ref 0.61–1.24)
GFR calc Af Amer: >60 mL/min (ref >60)
GLUCOSE: 58 mg/dL — AB (ref 65–99)
POTASSIUM: 3.3 mmol/L — AB (ref 3.5–5.1)
Sodium: 135 mmol/L (ref 135–145)
TOTAL PROTEIN: 5.7 g/dL — AB (ref 6.5–8.1)

## 2016-04-24 LAB — CBC
HEMATOCRIT: 29.5 % — AB (ref 39.0–52.0)
Hemoglobin: 9.8 g/dL — ABNORMAL LOW (ref 13.0–17.0)
MCH: 33.2 pg (ref 26.0–34.0)
MCHC: 33.2 g/dL (ref 30.0–36.0)
MCV: 100 fL (ref 78.0–100.0)
PLATELETS: 256 10*3/uL (ref 150–400)
RBC: 2.95 MIL/uL — ABNORMAL LOW (ref 4.22–5.81)
RDW: 16.8 % — AB (ref 11.5–15.5)
WBC: 8.7 10*3/uL (ref 4.0–10.5)

## 2016-04-24 LAB — SEDIMENTATION RATE: SED RATE: 85 mm/h — AB (ref 0–16)

## 2016-04-24 NOTE — Care Management Note (Signed)
Case Management Note Marvetta Gibbons RN, BSN Unit 2W-Case Manager [phone]  Patient Details  Name: [name] MRN: [id number] Date of Birth: 10-Apr-1951  Subjective/Objective:    Pt admitted with sepsis, hx of BKA                Action/Plan: PTA pt lived at home with spouse- anticipate return home with spouse- CM to follow for d/c needs  Expected Discharge Date:                  Expected Discharge Plan:  [place]  In-House Referral:     Discharge planning Services  CM Consult  Post Acute Care Choice:    Choice offered to:     DME Arranged:    DME Agency:     HH Arranged:    [place] Agency:     Status of Service:  In process, will continue to follow  If discussed at Long Length of Stay Meetings, dates discussed:    Additional Comments:  Dawayne Patricia, RN 04/24/2016, 9:36 AM

## 2016-04-24 NOTE — Progress Notes (Addendum)
Inpatient Diabetes Program Recommendations  AACE/ADA: New Consensus Statement on Inpatient Glycemic Control (2015)  Target Ranges:  Prepandial:   less than 140 mg/dL      Peak postprandial:   less than 180 mg/dL (1-2 hours)      Critically ill patients:  140 - 180 mg/dL  Results for [name], [name] (MRN [id number]) as of 04/24/2016 13:35  Ref. Range 04/22/2016 20:20 04/23/2016 07:26 04/24/2016 04:42  Glucose Latest Ref Range: 65 - 99 mg/dL 296 (H) 314 (H) 58 (L)  Results for [name], [name] (MRN [id number]) as of 04/24/2016 13:35  Ref. Range 04/23/2016 06:35 04/23/2016 10:34 04/23/2016 17:04 04/23/2016 21:20 04/24/2016 11:27  Glucose-Capillary Latest Ref Range: 65 - 99 mg/dL 306 (H) 430 (H) 295 (H) 123 (H) 94    Inpatient Diabetes Program Recommendations:  Noted blood glucose dropped to 58 @ 4:42. Spoke with nurse Bernadene Person regarding review of CBGs and recommended to give U-500 closer to breakfast in am after CBG checked to help prevent hypoglycemia. Patient's CBG 94 ac lunch. Please consider D/C of lunchtime dose of U-500 insulin.  Thank you, Nani Gasser. Khori Underberg, RN, MSN, CDE Inpatient Glycemic Control Team Team Pager [phone] (8am-5pm) 04/24/2016 1:49 PM

## 2016-04-24 NOTE — Progress Notes (Signed)
Patient ID: BREK GWINN, male   DOB: November 25, 1950, 65 y.o.   MRN: [id number]                                                                PROGRESS NOTE                                                                                                                                                                                                             Patient Demographics:    Steveland Gaschler, is a 65 y.o. male, DOB - December 15, 1950, [id number]  Admit date - 04/22/2016   Admitting Physician Rise Patience, MD  Outpatient Primary MD for the patient is Wendie Agreste, MD  LOS - 1  Outpatient Specialists: Noah Delaine (urology)  Chief Complaint  Patient presents with  . Chest Pain  . Hyperglycemia       Brief Narrative  65 y.o.malewith diabetes mellitus type 1, chronic anemia and history of nephrolithiasisrequiring nephrostomy tube and surgery in April of this year and was subsequently admitted for pyelonephritis in May of this year presentsto the [facility] of confusion and subjective feeling of fever and chills with abdominal discomfort. As per the patient's wife who provided the history patient was getting periods of confusion and lower abdominal discomfort and back pain over the last 3 days. Patient has had some nausea denies any vomiting or diarrhea. UA shows features consistent with UTI. Patient was tachycardic and labs revealed mildly elevated lactic acid with leukocytosis. CT of the abdomen and pelvis shows thickened urinary bladder wall and possible air. Also showed nephrolithiasis. Patient was given fluid bolus for sepsis secondary to UTI and admitted for further management. Denies any chest pain shortness of breath productive cough. On my exam patient has become alert awake and oriented.  ED Course:Patient was given fluid bolus and started on empiric antibiotics.   Subjective:    Wayland Denis today has been afebrile. Feeling slightly better.  Still with back pain, , No  headache, No chest pain, No abdominal pain - No Nausea, No new weakness tingling or numbness, No Cough - SOB.    Assessment  & Plan :    Principal Problem:   Sepsis ([place]) Active Problems:   Uncontrolled type 2 diabetes mellitus with hyperglycemia (HCC)   Chronic anemia  UTI (lower urinary tract infection)    Sepsis secondary to UTI F/u on urine culture Vanco iv 8/9=> (added yesterday due to spiking fevers) Imipenem iv 8/9 =>  Nephrolithiasis Urology on board, appreciate input Doesn't believe causing obstruction at this time  Dm2 fsbs ac and qhs, iss  Hyponatremia Resolved with hydration  Chronic anemia Check cbc in am  Hx of tia, cad,  Cont aspirin  R BKA stable  DVT prophylaxis:Lovenox. Code Status:Full code. Family Communication:Patient's wife. Disposition Plan:Home. Lab Results  Component Value Date   PLT 256 04/24/2016    Antibiotics  :    Anti-infectives    Start     Dose/Rate Route Frequency Ordered Stop   04/23/16 1200  vancomycin (VANCOCIN) 1,250 mg in sodium chloride 0.9 % 250 mL IVPB     1,250 mg 166.7 mL/hr over 90 Minutes Intravenous Every 12 hours 04/23/16 1059     04/23/16 0500  imipenem-cilastatin (PRIMAXIN) 500 mg in sodium chloride 0.9 % 100 mL IVPB     500 mg 200 mL/hr over 30 Minutes Intravenous Every 6 hours 04/23/16 0452     04/22/16 2030  piperacillin-tazobactam (ZOSYN) IVPB 3.375 g     3.375 g 100 mL/hr over 30 Minutes Intravenous  Once 04/22/16 2025 04/22/16 2108   04/22/16 2030  vancomycin (VANCOCIN) IVPB 1000 mg/200 mL premix  Status:  Discontinued     1,000 mg 200 mL/hr over 60 Minutes Intravenous  Once 04/22/16 2025 04/22/16 2029   04/22/16 2030  vancomycin (VANCOCIN) 2,500 mg in sodium chloride 0.9 % 500 mL IVPB     2,500 mg 250 mL/hr over 120 Minutes Intravenous  Once 04/22/16 2029 04/22/16 2238        Objective:   Vitals:   04/23/16 1318 04/23/16 1732 04/23/16 2013 04/24/16 0629  BP: (!) 156/92 (!) 176/53  (!) 135/48 (!) 106/44  Pulse: 92  96 85  Resp: 18  20 20   Temp: 99.1 F (37.3 C) (!) 103.3 F (39.6 C) 99.1 F (37.3 C) 98.8 F (37.1 C)  TempSrc: Oral Oral Oral Oral  SpO2: 98%  100% 98%  Weight:    126.9 kg (279 lb 12.8 oz)  Height:        Wt Readings from Last 3 Encounters:  04/24/16 126.9 kg (279 lb 12.8 oz)  04/17/16 123.7 kg (272 lb 9.6 oz)  04/08/16 113.4 kg (250 lb)     Intake/Output Summary (Last 24 hours) at 04/24/16 0745 Last data filed at 04/23/16 1700  Gross per 24 hour  Intake              240 ml  Output             1700 ml  Net            -1460 ml     Physical Exam  Awake Alert, Oriented X 3, No new F.N deficits, Normal affect [place].AT,PERRAL Supple Neck,No JVD, No cervical lymphadenopathy appriciated.  Symmetrical Chest wall movement, Good air movement bilaterally, CTAB RRR,No Gallops,Rubs or new Murmurs, No Parasternal Heave +ve B.Sounds, Abd Soft, No tenderness, No organomegaly appriciated, No rebound - guarding or rigidity. No Cyanosis, Clubbing or edema, No new Rash or bruise    BKA    Data Review:    CBC  Recent Labs Lab 04/17/16 0931 04/22/16 2020 04/23/16 0726 04/24/16 0442  WBC 6.7 13.1* 10.2 8.7  HGB 12.0* 12.2* 10.8* 9.8*  HCT 36.5* 36.2* 32.6* 29.5*  PLT 320 296 256 256  MCV 100.6* 98.6 101.2* 100.0  MCH 33.1* 33.2 33.5 33.2  MCHC 32.9 33.7 33.1 33.2  RDW 17.4* 16.4* 17.1* 16.8*  LYMPHSABS  --  1.2 1.5  --   MONOABS  --  1.0 1.2*  --   EOSABS  --  0.0 0.0  --   BASOSABS  --  0.0 0.0  --     Chemistries   Recent Labs Lab 04/17/16 0929 04/22/16 2020 04/23/16 0726 04/24/16 0442  NA 140 128* 133* 135  K 4.0 3.6 4.0 3.3*  CL 105 98* 102 106  CO2 27 21* 21* 24  GLUCOSE 147* 296* 314* 58*  BUN 11 12 10 9   CREATININE 0.86 1.06 0.92 0.88  CALCIUM 10.2 9.7 9.2 9.3  AST 15 23 19 25   ALT 15 19 16* 17  ALKPHOS 158* 131* 110 85  BILITOT 0.6 1.7* 1.1 0.5    ------------------------------------------------------------------------------------------------------------------ No results for input(s): CHOL, HDL, LDLCALC, TRIG, CHOLHDL, LDLDIRECT in the last 72 hours.  Lab Results  Component Value Date   HGBA1C 7.3 (H) 12/06/2015   ------------------------------------------------------------------------------------------------------------------ No results for input(s): TSH, T4TOTAL, T3FREE, THYROIDAB in the last 72 hours.  Invalid input(s): FREET3 ------------------------------------------------------------------------------------------------------------------ No results for input(s): VITAMINB12, FOLATE, FERRITIN, TIBC, IRON, RETICCTPCT in the last 72 hours.  Coagulation profile No results for input(s): INR, PROTIME in the last 168 hours.  No results for input(s): DDIMER in the last 72 hours.  Cardiac Enzymes No results for input(s): CKMB, TROPONINI, MYOGLOBIN in the last 168 hours.  Invalid input(s): CK ------------------------------------------------------------------------------------------------------------------    Component Value Date/Time   BNP 35.5 12/22/2015 0330    Inpatient Medications  Scheduled Meds: . aspirin EC  81 mg Oral q morning - 10a  . docusate sodium  200 mg Oral Daily  . DULoxetine  60 mg Oral Daily  . enoxaparin (LOVENOX) injection  40 mg Subcutaneous Daily  . imipenem-cilastatin  500 mg Intravenous Q6H  . insulin regular human CONCENTRATED  0-105 Units Subcutaneous Q breakfast   And  . insulin regular human CONCENTRATED  0-85 Units Subcutaneous QAC lunch   And  . insulin regular human CONCENTRATED  0-85 Units Subcutaneous QAC supper  . ofloxacin  1 drop Left Eye QID  . oxyCODONE  15 mg Oral Q8H  . pantoprazole  40 mg Oral Daily  . pregabalin  100 mg Oral TID  . tamsulosin  0.4 mg Oral QPC supper  . traZODone  100 mg Oral QHS  . vancomycin  1,250 mg Intravenous Q12H   Continuous Infusions:  PRN  Meds:.acetaminophen **OR** acetaminophen, LORazepam, ondansetron **OR** ondansetron (ZOFRAN) IV, promethazine, zolpidem  Micro Results Recent Results (from the past 240 hour(s))  Blood Culture (routine x 2)     Status: None (Preliminary result)   Collection Time: 04/22/16  8:20 PM  Result Value Ref Range Status   Specimen Description BLOOD LEFT ANTECUBITAL  Final   Special Requests BOTTLES DRAWN AEROBIC AND ANAEROBIC 5CC  Final   Culture NO GROWTH < 24 HOURS  Final   Report Status PENDING  Incomplete  Blood Culture (routine x 2)     Status: None (Preliminary result)   Collection Time: 04/22/16  9:35 PM  Result Value Ref Range Status   Specimen Description BLOOD RIGHT ARM  Final   Special Requests AEROBIC BOTTLE ONLY 10ML  Final   Culture NO GROWTH < 24 HOURS  Final   Report Status PENDING  Incomplete  Culture, blood (routine x 2)     Status:  None (Preliminary result)   Collection Time: 04/23/16 11:15 AM  Result Value Ref Range Status   Specimen Description BLOOD RIGHT ARM  Final   Special Requests BOTTLES DRAWN AEROBIC AND ANAEROBIC  5CC  Final   Culture NO GROWTH < 12 HOURS  Final   Report Status PENDING  Incomplete  Culture, blood (routine x 2)     Status: None (Preliminary result)   Collection Time: 04/23/16 11:35 AM  Result Value Ref Range Status   Specimen Description BLOOD RIGHT ARM  Final   Special Requests BOTTLES DRAWN AEROBIC AND ANAEROBIC  5CC  Final   Culture NO GROWTH < 12 HOURS  Final   Report Status PENDING  Incomplete    Radiology Reports Dg Chest 2 View  Result Date: 04/22/2016 CLINICAL DATA:  Acute onset of fever and generalized chest pain. Initial encounter. EXAM: CHEST  2 VIEW COMPARISON:  Chest radiograph performed 12/21/2015 FINDINGS: The lungs are well-aerated and clear. There is no evidence of focal opacification, pleural effusion or pneumothorax. Chronic pleural thickening is noted along the right lateral chest wall. Central scarring is noted overlying the  right hilum. The heart is normal in size; the mediastinal contour is within normal limits. No acute osseous abnormalities are seen. Scattered clips are seen overlying the left axilla and left breast. Heterotopic bone formation is noted about the proximal right humerus. IMPRESSION: No acute cardiopulmonary process seen. Chronic lung changes again noted. Electronically Signed   By: Garald Balding M.D.   On: 04/22/2016 21:23   Ct Abdomen Pelvis W Contrast  Result Date: 04/23/2016 CLINICAL DATA:  Acute onset of generalized abdominal pain and fever. Initial encounter. EXAM: CT ABDOMEN AND PELVIS WITH CONTRAST TECHNIQUE: Multidetector CT imaging of the abdomen and pelvis was performed using the standard protocol following bolus administration of intravenous contrast. CONTRAST:  100 mL of Isovue 300 IV contrast COMPARISON:  CT of the abdomen and pelvis from 01/31/2016 FINDINGS: The visualized lung bases are clear. A moderate to large hiatal hernia is noted, with associated postoperative change. The liver and spleen are unremarkable in appearance. The gallbladder is within normal limits. The pancreas and adrenal glands are unremarkable. A 1.0 cm stone is noted at the right renal pelvis. There is mild prominence of the right ureter. Mild nonspecific perinephric stranding is noted bilaterally. Additional nonobstructing bilateral renal stones are seen. A few small bilateral renal cysts are noted. No free fluid is identified. The small bowel is unremarkable in appearance. The stomach is within normal limits. No acute vascular abnormalities are seen. Mild scattered calcification is seen along the abdominal aorta and its branches. The appendix is not definitely characterized; there is no evidence of appendicitis. The colon is grossly unremarkable in appearance. The bladder is mildly thick walled, raising concern for cystitis. A small amount of air is noted within the bladder, which may reflect recent Foley catheter placement.  Would correlate for any evidence of emphysematous cystitis. The prostate is enlarged, measuring 5.2 cm in transverse dimension. No inguinal lymphadenopathy is seen. No acute osseous abnormalities are identified. IMPRESSION: 1. Mildly thick walled bladder raises concern for cystitis. Small amount of air within the bladder may reflect recent Foley catheter placement. Would correlate for any evidence of emphysematous cystitis. 2. Mild prominence of the right ureter, without evidence of hydronephrosis. 1.0 cm stone at the right renal pelvis, without definite evidence of obstructing stone. Additional smaller nonobstructing bilateral renal stones seen. 3. Moderate to large hiatal hernia, with associated postoperative change. 4. Small  bilateral renal cysts seen. 5. Mild scattered calcification along the abdominal aorta and its branches. 6. Enlarged prostate noted. Electronically Signed   By: Garald Balding M.D.   On: 04/23/2016 00:22    Time Spent in minutes  30   Jani Gravel M.D on 04/24/2016 at 7:45 AM  Between 7am to 7pm - Pager - [phone]  After 7pm go to [email or web address] - password [facility]  [facility] -  Office  [phone]

## 2016-04-25 DIAGNOSIS — N3001 Acute cystitis with hematuria: Secondary | ICD-10-CM

## 2016-04-25 DIAGNOSIS — N390 Urinary tract infection, site not specified: Secondary | ICD-10-CM

## 2016-04-25 DIAGNOSIS — N3000 Acute cystitis without hematuria: Secondary | ICD-10-CM

## 2016-04-25 DIAGNOSIS — B9689 Other specified bacterial agents as the cause of diseases classified elsewhere: Secondary | ICD-10-CM

## 2016-04-25 DIAGNOSIS — Z1612 Extended spectrum beta lactamase (ESBL) resistance: Secondary | ICD-10-CM

## 2016-04-25 DIAGNOSIS — Z882 Allergy status to sulfonamides status: Secondary | ICD-10-CM

## 2016-04-25 DIAGNOSIS — N200 Calculus of kidney: Secondary | ICD-10-CM

## 2016-04-25 DIAGNOSIS — A499 Bacterial infection, unspecified: Secondary | ICD-10-CM

## 2016-04-25 LAB — COMPREHENSIVE METABOLIC PANEL
ALK PHOS: 79 U/L (ref 38–126)
ALT: 20 U/L (ref 17–63)
ANION GAP: 6 (ref 5–15)
AST: 27 U/L (ref 15–41)
Albumin: 2.6 g/dL — ABNORMAL LOW (ref 3.5–5.0)
BILIRUBIN TOTAL: 0.6 mg/dL (ref 0.3–1.2)
BUN: 9 mg/dL (ref 6–20)
CALCIUM: 9 mg/dL (ref 8.9–10.3)
CO2: 24 mmol/L (ref 22–32)
CREATININE: 0.83 mg/dL (ref 0.61–1.24)
Chloride: 104 mmol/L (ref 101–111)
Glucose, Bld: 179 mg/dL — ABNORMAL HIGH (ref 65–99)
Potassium: 3.3 mmol/L — ABNORMAL LOW (ref 3.5–5.1)
SODIUM: 134 mmol/L — AB (ref 135–145)
TOTAL PROTEIN: 5.3 g/dL — AB (ref 6.5–8.1)

## 2016-04-25 LAB — GLUCOSE, CAPILLARY
GLUCOSE-CAPILLARY: 134 mg/dL — AB (ref 65–99)
Glucose-Capillary: 197 mg/dL — ABNORMAL HIGH (ref 65–99)
Glucose-Capillary: 299 mg/dL — ABNORMAL HIGH (ref 65–99)
Glucose-Capillary: 250 mg/dL — ABNORMAL HIGH (ref 65–99)

## 2016-04-25 LAB — URINE CULTURE

## 2016-04-25 LAB — CBC
HEMATOCRIT: 28.4 % — AB (ref 39.0–52.0)
HEMOGLOBIN: 9.2 g/dL — AB (ref 13.0–17.0)
MCH: 32.1 pg (ref 26.0–34.0)
MCHC: 32.4 g/dL (ref 30.0–36.0)
MCV: 99 fL (ref 78.0–100.0)
Platelets: 263 10*3/uL (ref 150–400)
RBC: 2.87 MIL/uL — ABNORMAL LOW (ref 4.22–5.81)
RDW: 16.6 % — ABNORMAL HIGH (ref 11.5–15.5)
WBC: 7 10*3/uL (ref 4.0–10.5)

## 2016-04-25 MED ORDER — SODIUM CHLORIDE 0.9 % IV SOLN
1000.0000 mg | Freq: Three times a day (TID) | INTRAVENOUS | Status: DC
  Filled 2016-04-25 (×2): qty 1000

## 2016-04-25 MED ORDER — SULFAMETHOXAZOLE-TRIMETHOPRIM 800-160 MG PO TABS: 1.0000 | ORAL_TABLET | Freq: Two times a day (BID) | ORAL | Status: DC

## 2016-04-25 MED ORDER — LIDOCAINE HCL (PF) 1 % IJ SOLN
INTRAMUSCULAR | Status: AC
  Administered 2016-04-25: 3.2 mL
  Filled 2016-04-25: qty 5

## 2016-04-25 MED ORDER — ERTAPENEM SODIUM 1 G IJ SOLR
1.0000 g | INTRAMUSCULAR | Status: DC
  Administered 2016-04-25: 1 g via INTRAMUSCULAR
  Filled 2016-04-25: qty 1

## 2016-04-25 MED ORDER — SENNA 8.6 MG PO TABS
1.0000 | ORAL_TABLET | Freq: Every day | ORAL | Status: DC
  Administered 2016-04-25: 8.6 mg via ORAL
  Filled 2016-04-25: qty 1

## 2016-04-25 MED ORDER — SULFAMETHOXAZOLE-TRIMETHOPRIM 800-160 MG PO TABS
1.0000 | ORAL_TABLET | Freq: Two times a day (BID) | ORAL | Status: DC
  Administered 2016-04-25: 1 via ORAL
  Filled 2016-04-25: qty 1
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
## 2016-04-25 NOTE — Progress Notes (Signed)
Pharmacy Antibiotic Note  [name] is a 65 y.o. male admitted on 04/22/2016 with a UTI.  Pharmacy has been consulted for imipenem dosing. Pt found with ESBL E coli UTI - susceptible to imipenem, gent, bactrim, nitrofurantoin. Pt's SCr down and qualifies for dose adjustment. Vanc d/c'ed this AM by MD.  Plan: - Stop vanc - Change imipenem to 1,000 mg IV q8h - F/u LOT, transition to PO - bactrim?  Height: 6\' 1"  (185.4 cm) Weight: 281 lb 1.6 oz (127.5 kg) IBW/kg (Calculated) : 79.9  Temp (24hrs), Avg:99.7 F (37.6 C), Min:98.7 F (37.1 C), Max:100.6 F (38.1 C)   Recent Labs Lab 04/22/16 2020 04/22/16 2043 04/23/16 0206 04/23/16 0726 04/24/16 0442 04/25/16 0224  WBC 13.1*  --   --  10.2 8.7 7.0  CREATININE 1.06  --   --  0.92 0.88 0.83  LATICACIDVEN  --  1.95* 1.39 1.2  --   --     Estimated Creatinine Clearance: 127.4 mL/min (by C-G formula based on SCr of 0.83 mg/dL).    Allergies  Allergen Reactions  . Symbicort [Budesonide-Formoterol Fumarate] Other (See Comments)    Hyperglycemia   . Advair Diskus [Fluticasone-Salmeterol] Other (See Comments)    hyperglycemia  . Hydromorphone Nausea And Vomiting    Itching   . Morphine Itching  . Soap Itching and Other (See Comments)    Other Reaction: ivory soap=itching     Antimicrobials this admission: Vanc 8/8 >> 8/11 Zosyn 8/8 x 1 Imipenem 8/9 >>  Dose adjustments this admission: None  Microbiology results: 8/9 BCx: NGTD 8/8 UCx: ESBL E coli (R amp, unasyn, cefazolin, ceftriaxone, cipro; S to gent, imipenem, nitro, bactrim)   Thank you for allowing pharmacy to be a part of this patient's care.  Sheridan Gettel L. Nicole Kindred, PharmD Infectious Diseases Clinical Pharmacist Pager: [phone] 04/25/2016 9:55 AM

## 2016-04-25 NOTE — Progress Notes (Signed)
[facility] PROGRESS NOTE  [name] [name] DOB: June 04, 1951 DOA: 04/22/2016 PCP: Wendie Agreste, MD Principal Problem:   Sepsis [facility]) Active Problems:   Uncontrolled type 2 diabetes mellitus with hyperglycemia (HCC)   Chronic anemia   UTI (lower urinary tract infection)   Assessment/Plan: 65 y.o.malewith PMH of IDDM, dCHF, Chronic anemia and history of nephrolithiasisrequiring nephrostomy tube and surgery in April of this year and was subsequently admitted for pyelonephritis in May of this year [facility] and subjective feeling of fever and chills with abdominal discomfort. As per the patient's wife who provided the history patient was getting periods of confusion and lower abdominal discomfort and back pain over the last 3 days.  -Admitted with urinary tract infection. Patient was tachycardic and labs revealed mildly elevated lactic acid with leukocytosis. CT of the abdomen and pelvis shows thickened urinary bladder wall and possible air. Also showed nephrolithiasis. Patient was given fluid bolus for sepsis secondary to UTI and admitted for further management.    Sepsis secondary to UTI. Prelim urine culture: ESBL e coli. Blood cultures: NGTD.  -cont Imipenem iv 8/9 >>. D/c vanc. F/u cultures. Questionable iv atx at discharge vs several more days inpatient. consulted ID eval  Nephrolithiasis. Urology on board, appreciate input. Doesn't believe causing obstruction at this time  Dm2. On U 500 insulin at home. Recent ha1c-7.3 (11/2015). Cont insulin regimen, monitor closely  Hx of tia, cad. No acute cardiopulmonary or foal neuro symptoms. cont aspirin  R BKA stable  Code Status: full Family Communication: d/w patient, his wife (indicate person spoken with, relationship, and if by phone, the number) Disposition Plan: home soon    Consultants:  Urology   Procedures:  none  Antibiotics:  vanc 8/9>>>8/11  Imipenem 8/9>>  (indicate start date, and stop date if known)  HPI/Subjective: Alert, no distress   Objective: Vitals:   04/25/16 0756 04/25/16 0824  BP:    Pulse:    Resp:    Temp: 98.7 F (37.1 C) 99.3 F (37.4 C)    Intake/Output Summary (Last 24 hours) at 04/25/16 0852 Last data filed at 04/25/16 [id number]  Gross per 24 hour  Intake             1104 ml  Output             3325 ml  Net            -2221 ml   Filed Weights   04/22/16 2009 04/24/16 0629 04/25/16 0414  Weight: 123.4 kg (272 lb) 126.9 kg (279 lb 12.8 oz) 127.5 kg (281 lb 1.6 oz)    Exam:   General:  Comfortable   Cardiovascular: s1,s2 rrr  Respiratory: CTA BL   Abdomen: soft, nt,n d  Musculoskeletal: right BKA   Data Reviewed: Basic Metabolic Panel:  Recent Labs Lab 04/22/16 2020 04/23/16 0726 04/24/16 0442 04/25/16 0224  NA 128* 133* 135 134*  K 3.6 4.0 3.3* 3.3*  CL 98* 102 106 104  CO2 21* 21* 24 24  GLUCOSE 296* 314* 58* 179*  BUN 12 10 9 9   CREATININE 1.06 0.92 0.88 0.83  CALCIUM 9.7 9.2 9.3 9.0   Liver Function Tests:  Recent Labs Lab 04/22/16 2020 04/23/16 0726 04/24/16 0442 04/25/16 0224  AST 23 19 25 27   ALT 19 16* 17 20  ALKPHOS 131* 110 85 79  BILITOT 1.7* 1.1 0.5 0.6  PROT 6.8 6.0* 5.7* 5.3*  ALBUMIN 3.6 3.0* 2.8* 2.6*  Recent Labs Lab 04/22/16 2020  LIPASE 13   No results for input(s): AMMONIA in the last 168 hours. CBC:  Recent Labs Lab 04/22/16 2020 04/23/16 0726 04/24/16 0442 04/25/16 0224  WBC 13.1* 10.2 8.7 7.0  NEUTROABS 11.0* 7.5  --   --   HGB 12.2* 10.8* 9.8* 9.2*  HCT 36.2* 32.6* 29.5* 28.4*  MCV 98.6 101.2* 100.0 99.0  PLT 296 256 256 263   Cardiac Enzymes: No results for input(s): CKTOTAL, CKMB, CKMBINDEX, TROPONINI in the last 168 hours. BNP (last 3 results)  Recent Labs  12/22/15 0330  BNP 35.5    ProBNP (last 3 results) No results for input(s): PROBNP in the last 8760 hours.  CBG:  Recent Labs Lab 04/24/16 1420 04/24/16 1635  04/24/16 1804 04/24/16 2114 04/25/16 0622  GLUCAP 63* 66 133* 155* 197*    Recent Results (from the past 240 hour(s))  Blood Culture (routine x 2)     Status: None (Preliminary result)   Collection Time: 04/22/16  8:20 PM  Result Value Ref Range Status   Specimen Description BLOOD LEFT ANTECUBITAL  Final   Special Requests BOTTLES DRAWN AEROBIC AND ANAEROBIC 5CC  Final   Culture NO GROWTH 2 DAYS  Final   Report Status PENDING  Incomplete  Urine culture     Status: Abnormal   Collection Time: 04/22/16  8:37 PM  Result Value Ref Range Status   Specimen Description URINE, RANDOM  Final   Special Requests NONE  Final   Culture (A)  Final    >=100,000 COLONIES/mL ESCHERICHIA COLI Confirmed Extended Spectrum Beta-Lactamase Producer (ESBL)    Report Status 04/25/2016 FINAL  Final   Organism ID, Bacteria ESCHERICHIA COLI (A)  Final      Susceptibility   Escherichia coli - MIC*    AMPICILLIN >=32 RESISTANT Resistant     CEFAZOLIN >=64 RESISTANT Resistant     CEFTRIAXONE >=64 RESISTANT Resistant     CIPROFLOXACIN >=4 RESISTANT Resistant     GENTAMICIN <=1 SENSITIVE Sensitive     IMIPENEM <=0.25 SENSITIVE Sensitive     NITROFURANTOIN <=16 SENSITIVE Sensitive     TRIMETH/SULFA <=20 SENSITIVE Sensitive     AMPICILLIN/SULBACTAM 16 INTERMEDIATE Intermediate     PIP/TAZO <=4 SENSITIVE Sensitive     Extended ESBL POSITIVE Resistant     * >=100,000 COLONIES/mL ESCHERICHIA COLI  Blood Culture (routine x 2)     Status: None (Preliminary result)   Collection Time: 04/22/16  9:35 PM  Result Value Ref Range Status   Specimen Description BLOOD RIGHT ARM  Final   Special Requests AEROBIC BOTTLE ONLY 10ML  Final   Culture NO GROWTH 1 DAY  Final   Report Status PENDING  Incomplete  Culture, blood (routine x 2)     Status: None (Preliminary result)   Collection Time: 04/23/16 11:15 AM  Result Value Ref Range Status   Specimen Description BLOOD RIGHT ARM  Final   Special Requests BOTTLES DRAWN  AEROBIC AND ANAEROBIC  5CC  Final   Culture NO GROWTH 1 DAY  Final   Report Status PENDING  Incomplete  Culture, blood (routine x 2)     Status: None (Preliminary result)   Collection Time: 04/23/16 11:35 AM  Result Value Ref Range Status   Specimen Description BLOOD RIGHT ARM  Final   Special Requests BOTTLES DRAWN AEROBIC AND ANAEROBIC  5CC  Final   Culture NO GROWTH 1 DAY  Final   Report Status PENDING  Incomplete  Studies: No results found.  Scheduled Meds: . aspirin EC  81 mg Oral q morning - 10a  . docusate sodium  200 mg Oral Daily  . DULoxetine  60 mg Oral Daily  . enoxaparin (LOVENOX) injection  40 mg Subcutaneous Daily  . imipenem-cilastatin  500 mg Intravenous Q6H  . insulin regular human CONCENTRATED  0-105 Units Subcutaneous Q breakfast   And  . insulin regular human CONCENTRATED  0-85 Units Subcutaneous QAC supper  . ofloxacin  1 drop Left Eye QID  . oxyCODONE  15 mg Oral Q8H  . pantoprazole  40 mg Oral Daily  . pregabalin  100 mg Oral TID  . tamsulosin  0.4 mg Oral QPC supper  . traZODone  100 mg Oral QHS  . vancomycin  1,250 mg Intravenous Q12H   Continuous Infusions:   Principal Problem:   Sepsis ([place]) Active Problems:   Uncontrolled type 2 diabetes mellitus with hyperglycemia (HCC)   Chronic anemia   UTI (lower urinary tract infection)    Time spent: >35 minutes     Kinnie Feil  [facility] Pager [phone]. If 7PM-7AM, please contact night-coverage at [email or web address], password [facility] 04/25/2016, 8:52 AM  LOS: 2 days

## 2016-04-25 NOTE — Progress Notes (Signed)
Patient laying in bed, wife present at bedside, call light within reach

## 2016-04-26 DIAGNOSIS — A499 Bacterial infection, unspecified: Secondary | ICD-10-CM

## 2016-04-26 LAB — GLUCOSE, CAPILLARY
GLUCOSE-CAPILLARY: 303 mg/dL — AB (ref 65–99)
Glucose-Capillary: 299 mg/dL — ABNORMAL HIGH (ref 65–99)
Glucose-Capillary: 353 mg/dL — ABNORMAL HIGH (ref 65–99)

## 2016-04-26 MED ORDER — SENNA 8.6 MG PO TABS
1.0000 | ORAL_TABLET | Freq: Two times a day (BID) | ORAL | Status: DC
  Administered 2016-04-26 – 2016-04-28 (×5): 8.6 mg via ORAL
  Filled 2016-04-26 (×5): qty 1

## 2016-04-26 MED ORDER — POLYETHYLENE GLYCOL 3350 17 G PO PACK
17.0000 g | PACK | Freq: Every day | ORAL | Status: DC
  Administered 2016-04-26 – 2016-04-27 (×2): 17 g via ORAL
  Filled 2016-04-26 (×3): qty 1

## 2016-04-26 MED ORDER — SODIUM CHLORIDE 0.9 % IV SOLN
1.0000 g | INTRAVENOUS | Status: DC
  Administered 2016-04-26 – 2016-04-28 (×3): 1 g via INTRAVENOUS
  Filled 2016-04-26 (×4): qty 1

## 2016-04-26 MED ORDER — ERTAPENEM SODIUM 1 G IJ SOLR: 1.0000 g | INTRAMUSCULAR | 0 refills | Status: AC

## 2016-04-26 NOTE — Progress Notes (Signed)
[facility] PROGRESS NOTE  [name] [id number] DOB: 1951-03-22 DOA: 04/22/2016 PCP: Wendie Agreste, MD   Principal Problem:   Sepsis [facility]) Active Problems:   Uncontrolled type 2 diabetes mellitus with hyperglycemia (HCC)   Chronic anemia   UTI (lower urinary tract infection)   Brief summary  65 y.o.malewith PMH of IDDM, dCHF, Chronic anemia and history of nephrolithiasisrequiring nephrostomy tube and surgery in April of this year and was subsequently admitted for pyelonephritis in May of this year presentsto the [facility] of confusion and subjective feeling of fever and chills with abdominal discomfort. As per the patient's wife who provided the history patient was getting periods of confusion and lower abdominal discomfort and back pain over the last 3 days.  -Admitted with urinary tract infection. Patient was tachycardic and labs revealed mildly elevated lactic acid with leukocytosis. CT of the abdomen and pelvis shows thickened urinary bladder wall and possible air. Also showed nephrolithiasis. Patient was given fluid bolus for sepsis secondary to UTI and admitted for further management.   Assessment/Plan: Sepsis secondary to UTI. Prelim urine culture: ESBL e coli. Blood cultures: NGTD.  -patient received Imipenem iv 8/9 >>8/11. ID recommended PICC iv ertapenem for 14 days last dose 8/23. Will place PICC today   Nephrolithiasis. Urology on board, appreciate input. Doesn't believe causing obstruction at this time  Dm2. On U 500 insulin at home. Recent ha1c-7.3 (11/2015). Cont insulin regimen, monitor closely  Hx of tia, cad. No acute cardiopulmonary or foal neuro symptoms. cont aspirin  R BKA stable  Code Status: full Family Communication: d/w patient, his wife (indicate person spoken with, relationship, and if by phone, the number) Disposition Plan: PICC line.    Consultants:  Urology   Procedures:  none  Antibiotics:  vanc 8/9>>>8/11  Imipenem  8/9>>8/11  Ertapenem 8/11>>> (indicate start date, and stop date if known)  HPI/Subjective: Alert, no distress   Objective: Vitals:   04/25/16 2354 04/26/16 0335  BP:  (!) 165/70  Pulse:  87  Resp:  20  Temp: 99.3 F (37.4 C) 99 F (37.2 C)    Intake/Output Summary (Last 24 hours) at 04/26/16 0844 Last data filed at 04/25/16 2300  Gross per 24 hour  Intake             1192 ml  Output             2375 ml  Net            -1183 ml   Filed Weights   04/24/16 0629 04/25/16 0414 04/26/16 0335  Weight: 126.9 kg (279 lb 12.8 oz) 127.5 kg (281 lb 1.6 oz) 123.9 kg (273 lb 1.6 oz)    Exam:   General:  Comfortable   Cardiovascular: s1,s2 rrr  Respiratory: CTA BL   Abdomen: soft, nt,n d  Musculoskeletal: right BKA   Data Reviewed: Basic Metabolic Panel:  Recent Labs Lab 04/22/16 2020 04/23/16 0726 04/24/16 0442 04/25/16 0224  NA 128* 133* 135 134*  K 3.6 4.0 3.3* 3.3*  CL 98* 102 106 104  CO2 21* 21* 24 24  GLUCOSE 296* 314* 58* 179*  BUN 12 10 9 9   CREATININE 1.06 0.92 0.88 0.83  CALCIUM 9.7 9.2 9.3 9.0   Liver Function Tests:  Recent Labs Lab 04/22/16 2020 04/23/16 0726 04/24/16 0442 04/25/16 0224  AST 23 19 25 27   ALT 19 16* 17 20  ALKPHOS 131* 110 85 79  BILITOT 1.7* 1.1 0.5 0.6  PROT 6.8 6.0* 5.7*  5.3*  ALBUMIN 3.6 3.0* 2.8* 2.6*    Recent Labs Lab 04/22/16 2020  LIPASE 13   No results for input(s): AMMONIA in the last 168 hours. CBC:  Recent Labs Lab 04/22/16 2020 04/23/16 0726 04/24/16 0442 04/25/16 0224  WBC 13.1* 10.2 8.7 7.0  NEUTROABS 11.0* 7.5  --   --   HGB 12.2* 10.8* 9.8* 9.2*  HCT 36.2* 32.6* 29.5* 28.4*  MCV 98.6 101.2* 100.0 99.0  PLT 296 256 256 263   Cardiac Enzymes: No results for input(s): CKTOTAL, CKMB, CKMBINDEX, TROPONINI in the last 168 hours. BNP (last 3 results)  Recent Labs  12/22/15 0330  BNP 35.5    ProBNP (last 3 results) No results for input(s): PROBNP in the last 8760  hours.  CBG:  Recent Labs Lab 04/24/16 2114 04/25/16 0622 04/25/16 1145 04/25/16 1630 04/25/16 2108  GLUCAP 155* 197* 299* 250* 134*    Recent Results (from the past 240 hour(s))  Blood Culture (routine x 2)     Status: None (Preliminary result)   Collection Time: 04/22/16  8:20 PM  Result Value Ref Range Status   Specimen Description BLOOD LEFT ANTECUBITAL  Final   Special Requests BOTTLES DRAWN AEROBIC AND ANAEROBIC 5CC  Final   Culture NO GROWTH 3 DAYS  Final   Report Status PENDING  Incomplete  Urine culture     Status: Abnormal   Collection Time: 04/22/16  8:37 PM  Result Value Ref Range Status   Specimen Description URINE, RANDOM  Final   Special Requests NONE  Final   Culture (A)  Final    >=100,000 COLONIES/mL ESCHERICHIA COLI Confirmed Extended Spectrum Beta-Lactamase Producer (ESBL)    Report Status 04/25/2016 FINAL  Final   Organism ID, Bacteria ESCHERICHIA COLI (A)  Final      Susceptibility   Escherichia coli - MIC*    AMPICILLIN >=32 RESISTANT Resistant     CEFAZOLIN >=64 RESISTANT Resistant     CEFTRIAXONE >=64 RESISTANT Resistant     CIPROFLOXACIN >=4 RESISTANT Resistant     GENTAMICIN <=1 SENSITIVE Sensitive     IMIPENEM <=0.25 SENSITIVE Sensitive     NITROFURANTOIN <=16 SENSITIVE Sensitive     TRIMETH/SULFA <=20 SENSITIVE Sensitive     AMPICILLIN/SULBACTAM 16 INTERMEDIATE Intermediate     PIP/TAZO <=4 SENSITIVE Sensitive     Extended ESBL POSITIVE Resistant     * >=100,000 COLONIES/mL ESCHERICHIA COLI  Blood Culture (routine x 2)     Status: None (Preliminary result)   Collection Time: 04/22/16  9:35 PM  Result Value Ref Range Status   Specimen Description BLOOD RIGHT ARM  Final   Special Requests AEROBIC BOTTLE ONLY 10ML  Final   Culture NO GROWTH 2 DAYS  Final   Report Status PENDING  Incomplete  Culture, blood (routine x 2)     Status: None (Preliminary result)   Collection Time: 04/23/16 11:15 AM  Result Value Ref Range Status   Specimen  Description BLOOD RIGHT ARM  Final   Special Requests BOTTLES DRAWN AEROBIC AND ANAEROBIC  5CC  Final   Culture NO GROWTH 2 DAYS  Final   Report Status PENDING  Incomplete  Culture, blood (routine x 2)     Status: None (Preliminary result)   Collection Time: 04/23/16 11:35 AM  Result Value Ref Range Status   Specimen Description BLOOD RIGHT ARM  Final   Special Requests BOTTLES DRAWN AEROBIC AND ANAEROBIC  5CC  Final   Culture NO GROWTH 2 DAYS  Final  Report Status PENDING  Incomplete     Studies: No results found.  Scheduled Meds: . aspirin EC  81 mg Oral q morning - 10a  . docusate sodium  200 mg Oral Daily  . DULoxetine  60 mg Oral Daily  . enoxaparin (LOVENOX) injection  40 mg Subcutaneous Daily  . ertapenem  1 g Intravenous Q24H  . insulin regular human CONCENTRATED  0-105 Units Subcutaneous Q breakfast   And  . insulin regular human CONCENTRATED  0-85 Units Subcutaneous QAC supper  . ofloxacin  1 drop Left Eye QID  . oxyCODONE  15 mg Oral Q8H  . pantoprazole  40 mg Oral Daily  . pregabalin  100 mg Oral TID  . senna  1 tablet Oral Daily  . tamsulosin  0.4 mg Oral QPC supper  . traZODone  100 mg Oral QHS   Continuous Infusions:   Principal Problem:   Sepsis ([place]) Active Problems:   Uncontrolled type 2 diabetes mellitus with hyperglycemia (HCC)   Chronic anemia   Complicated UTI (urinary tract infection)   Acute cystitis without hematuria   ESBL (extended spectrum beta-lactamase) producing bacteria infection   Acute cystitis with hematuria    Time spent: >35 minutes     Kinnie Feil  [facility] Pager [phone]. If 7PM-7AM, please contact night-coverage at [email or web address], password [facility] 04/26/2016, 8:44 AM  LOS: 3 days

## 2016-04-27 LAB — CULTURE, BLOOD (ROUTINE X 2): CULTURE: NO GROWTH

## 2016-04-27 LAB — GLUCOSE, CAPILLARY
GLUCOSE-CAPILLARY: 150 mg/dL — AB (ref 65–99)
GLUCOSE-CAPILLARY: 164 mg/dL — AB (ref 65–99)
GLUCOSE-CAPILLARY: 176 mg/dL — AB (ref 65–99)
Glucose-Capillary: 158 mg/dL — ABNORMAL HIGH (ref 65–99)

## 2016-04-27 MED ORDER — FUROSEMIDE 40 MG PO TABS
40.0000 mg | ORAL_TABLET | Freq: Every day | ORAL | Status: DC
  Administered 2016-04-27 – 2016-04-28 (×2): 40 mg via ORAL
  Filled 2016-04-27 (×2): qty 1

## 2016-04-27 MED ORDER — POTASSIUM CHLORIDE CRYS ER 20 MEQ PO TBCR
40.0000 meq | EXTENDED_RELEASE_TABLET | Freq: Once | ORAL | Status: AC
  Administered 2016-04-27: 40 meq via ORAL
  Filled 2016-04-27: qty 2

## 2016-04-27 MED ORDER — POTASSIUM CHLORIDE CRYS ER 20 MEQ PO TBCR
20.0000 meq | EXTENDED_RELEASE_TABLET | Freq: Every day | ORAL | Status: DC
  Administered 2016-04-28: 20 meq via ORAL
  Filled 2016-04-27: qty 1

## 2016-04-27 MED ORDER — POTASSIUM CHLORIDE CRYS ER 20 MEQ PO TBCR
20.0000 meq | EXTENDED_RELEASE_TABLET | Freq: Once | ORAL | Status: AC
  Administered 2016-04-27: 20 meq via ORAL
  Filled 2016-04-27: qty 1

## 2016-04-27 NOTE — Progress Notes (Signed)
[facility] PROGRESS NOTE  [name] [id number] DOB: [birth year] DOA: 04/22/2016 PCP: Wendie Agreste, MD   Principal Problem:   Sepsis [facility]) Active Problems:   Uncontrolled type 2 diabetes mellitus with hyperglycemia (HCC)   Chronic anemia   UTI (lower urinary tract infection)   Brief summary  65 y.o.malewith PMH of IDDM, dCHF, Chronic anemia and history of nephrolithiasisrequiring nephrostomy tube and surgery in April of this year and was subsequently admitted for pyelonephritis in May of this year presentsto the [facility] of confusion and subjective feeling of fever and chills with abdominal discomfort. As per the patient's wife who provided the history patient was getting periods of confusion and lower abdominal discomfort and back pain over the last 3 days.  -Admitted with urinary tract infection. Patient was tachycardic and labs revealed mildly elevated lactic acid with leukocytosis. CT of the abdomen and pelvis shows thickened urinary bladder wall and possible air. Also showed nephrolithiasis. Patient was given fluid bolus for sepsis secondary to UTI and admitted for further management. Urine cultures is positive for ESBL. Infectious disease recommeded iv atx for 2 weeks. Urology recommended antibiotic treatment and f/u as outpatient for nephrolithiasis.   Assessment/Plan:  Sepsis secondary to UTI. Prelim urine culture: ESBL e coli. Blood cultures: NGTD.  -Patient received Imipenem iv 8/9 >>8/11.  ID recommended PICC iv ertapenem for 14 days last dose 8/23.  -afebrile >24 hrs, will place PICC today on 8/13, consult SW to arrange [place], iv antibiotics   Nephrolithiasis. Urology on board, appreciate input. Doesn't believe causing obstruction at this time. But needs stone removal to prevent reinfection. Urology plans outpatient follow up   Dm2. On U 500 insulin at home. Recent ha1c-7.3 (11/2015). Cont insulin regimen, monitor closely  Hx of TIA. CAD.dCHF. No acute  cardiopulmonary or foal neuro symptoms. cont aspirin, resume lasix.  R BKA stable  Code Status: full Family Communication: d/w patient, his wife (indicate person spoken with, relationship, and if by phone, the number) Disposition Plan: PICC line today, consult SW for [place], iv antibiotics     Consultants:  Urology   Procedures:  none  Antibiotics:  vanc 8/9>>>8/11  Imipenem 8/9>>8/11  Ertapenem 8/11>>> (indicate start date, and stop date if known)  HPI/Subjective: Alert, no distress. Denies acute pains, no SOB. Reports constipation, wants to try oral bowel regimen    Objective: Vitals:   04/26/16 2112 04/27/16 0440  BP: (!) 154/68 (!) 146/61  Pulse: 81 88  Resp: 19 18  Temp: 99.4 F (37.4 C) 98.8 F (37.1 C)    Intake/Output Summary (Last 24 hours) at 04/27/16 0914 Last data filed at 04/27/16 0745  Gross per 24 hour  Intake             1010 ml  Output             5425 ml  Net            -4415 ml   Filed Weights   04/25/16 0414 04/26/16 0335 04/27/16 0440  Weight: 127.5 kg (281 lb 1.6 oz) 123.9 kg (273 lb 1.6 oz) 124.1 kg (273 lb 8 oz)    Exam:   General:  Comfortable   Cardiovascular: s1,s2 rrr  Respiratory: CTA BL   Abdomen: soft, nt,n d  Musculoskeletal: right BKA   Data Reviewed: Basic Metabolic Panel:  Recent Labs Lab 04/22/16 2020 04/23/16 0726 04/24/16 0442 04/25/16 0224  NA 128* 133* 135 134*  K 3.6 4.0 3.3* 3.3*  CL 98* 102 106  104  CO2 21* 21* 24 24  GLUCOSE 296* 314* 58* 179*  BUN 12 10 9 9   CREATININE 1.06 0.92 0.88 0.83  CALCIUM 9.7 9.2 9.3 9.0   Liver Function Tests:  Recent Labs Lab 04/22/16 2020 04/23/16 0726 04/24/16 0442 04/25/16 0224  AST 23 19 25 27   ALT 19 16* 17 20  ALKPHOS 131* 110 85 79  BILITOT 1.7* 1.1 0.5 0.6  PROT 6.8 6.0* 5.7* 5.3*  ALBUMIN 3.6 3.0* 2.8* 2.6*    Recent Labs Lab 04/22/16 2020  LIPASE 13   No results for input(s): AMMONIA in the last 168 hours. CBC:  Recent Labs Lab  04/22/16 2020 04/23/16 0726 04/24/16 0442 04/25/16 0224  WBC 13.1* 10.2 8.7 7.0  NEUTROABS 11.0* 7.5  --   --   HGB 12.2* 10.8* 9.8* 9.2*  HCT 36.2* 32.6* 29.5* 28.4*  MCV 98.6 101.2* 100.0 99.0  PLT 296 256 256 263   Cardiac Enzymes: No results for input(s): CKTOTAL, CKMB, CKMBINDEX, TROPONINI in the last 168 hours. BNP (last 3 results)  Recent Labs  12/22/15 0330  BNP 35.5    ProBNP (last 3 results) No results for input(s): PROBNP in the last 8760 hours.  CBG:  Recent Labs Lab 04/25/16 2108 04/26/16 1134 04/26/16 1631 04/26/16 2137 04/27/16 0624  GLUCAP 134* 299* 353* 303* 150*    Recent Results (from the past 240 hour(s))  Blood Culture (routine x 2)     Status: None (Preliminary result)   Collection Time: 04/22/16  8:20 PM  Result Value Ref Range Status   Specimen Description BLOOD LEFT ANTECUBITAL  Final   Special Requests BOTTLES DRAWN AEROBIC AND ANAEROBIC 5CC  Final   Culture NO GROWTH 4 DAYS  Final   Report Status PENDING  Incomplete  Urine culture     Status: Abnormal   Collection Time: 04/22/16  8:37 PM  Result Value Ref Range Status   Specimen Description URINE, RANDOM  Final   Special Requests NONE  Final   Culture (A)  Final    >=100,000 COLONIES/mL ESCHERICHIA COLI Confirmed Extended Spectrum Beta-Lactamase Producer (ESBL)    Report Status 04/25/2016 FINAL  Final   Organism ID, Bacteria ESCHERICHIA COLI (A)  Final      Susceptibility   Escherichia coli - MIC*    AMPICILLIN >=32 RESISTANT Resistant     CEFAZOLIN >=64 RESISTANT Resistant     CEFTRIAXONE >=64 RESISTANT Resistant     CIPROFLOXACIN >=4 RESISTANT Resistant     GENTAMICIN <=1 SENSITIVE Sensitive     IMIPENEM <=0.25 SENSITIVE Sensitive     NITROFURANTOIN <=16 SENSITIVE Sensitive     TRIMETH/SULFA <=20 SENSITIVE Sensitive     AMPICILLIN/SULBACTAM 16 INTERMEDIATE Intermediate     PIP/TAZO <=4 SENSITIVE Sensitive     Extended ESBL POSITIVE Resistant     * >=100,000 COLONIES/mL  ESCHERICHIA COLI  Blood Culture (routine x 2)     Status: None (Preliminary result)   Collection Time: 04/22/16  9:35 PM  Result Value Ref Range Status   Specimen Description BLOOD RIGHT ARM  Final   Special Requests AEROBIC BOTTLE ONLY 10ML  Final   Culture NO GROWTH 3 DAYS  Final   Report Status PENDING  Incomplete  Culture, blood (routine x 2)     Status: None (Preliminary result)   Collection Time: 04/23/16 11:15 AM  Result Value Ref Range Status   Specimen Description BLOOD RIGHT ARM  Final   Special Requests BOTTLES DRAWN AEROBIC AND ANAEROBIC  5CC  Final   Culture NO GROWTH 3 DAYS  Final   Report Status PENDING  Incomplete  Culture, blood (routine x 2)     Status: None (Preliminary result)   Collection Time: 04/23/16 11:35 AM  Result Value Ref Range Status   Specimen Description BLOOD RIGHT ARM  Final   Special Requests BOTTLES DRAWN AEROBIC AND ANAEROBIC  5CC  Final   Culture NO GROWTH 3 DAYS  Final   Report Status PENDING  Incomplete     Studies: No results found.  Scheduled Meds: . aspirin EC  81 mg Oral q morning - 10a  . docusate sodium  200 mg Oral Daily  . DULoxetine  60 mg Oral Daily  . enoxaparin (LOVENOX) injection  40 mg Subcutaneous Daily  . ertapenem  1 g Intravenous Q24H  . insulin regular human CONCENTRATED  0-105 Units Subcutaneous Q breakfast   And  . insulin regular human CONCENTRATED  0-85 Units Subcutaneous QAC supper  . ofloxacin  1 drop Left Eye QID  . oxyCODONE  15 mg Oral Q8H  . pantoprazole  40 mg Oral Daily  . polyethylene glycol  17 g Oral Daily  . pregabalin  100 mg Oral TID  . senna  1 tablet Oral BID  . tamsulosin  0.4 mg Oral QPC supper  . traZODone  100 mg Oral QHS   Continuous Infusions:   Principal Problem:   Sepsis ([place]) Active Problems:   Uncontrolled type 2 diabetes mellitus with hyperglycemia (HCC)   Chronic anemia   Complicated UTI (urinary tract infection)   Acute cystitis without hematuria   ESBL (extended spectrum  beta-lactamase) producing bacteria infection   Acute cystitis with hematuria    Time spent: >35 minutes     Kinnie Feil  [facility] Pager [phone]. If 7PM-7AM, please contact night-coverage at [email or web address], password [facility] 04/27/2016, 9:14 AM  LOS: 4 days

## 2016-04-27 NOTE — Care Management Note (Addendum)
Case Management Note  Patient Details  Name: [name] MRN: [id number] Date of Birth: 03/19/1951  Subjective/Objective:                  Sepsis  Action/Plan: CM spoke to patient regarding [facility] services and [facility]. CM called Tiffany with [facility] to advise of referral and faxed orders for home IV abx (ertapenum) to [facility] @ [phone] and confirmation received. Patient said that he has all of the DME needed like cane, walker, and BSC. His wife and daughter will be at home with him to assist and denied any further needs for CM. Cm remains available for any further discharge planning needs.   Expected Discharge Date:                 Expected Discharge Plan:  [place]  In-House Referral:     Discharge planning Services  CM Consult  Post Acute Care Choice:  Home Health Choice offered to:  Patient  DME Arranged:    DME Agency:     HH Arranged:  RN, IV Antibiotics HH Agency:  [facility]  Status of Service:  In process, will continue to follow  If discussed at Long Length of Stay Meetings, dates discussed:    Additional Comments:  Guido Sander, RN 04/27/2016, 11:07 AM

## 2016-04-27 NOTE — Progress Notes (Signed)
The patient and family are requesting nystatin oral solution for sore mouth.

## 2016-04-28 DIAGNOSIS — N3001 Acute cystitis with hematuria: Secondary | ICD-10-CM

## 2016-04-28 DIAGNOSIS — A4151 Sepsis due to Escherichia coli [E. coli]: Secondary | ICD-10-CM

## 2016-04-28 LAB — GLUCOSE, CAPILLARY
GLUCOSE-CAPILLARY: 144 mg/dL — AB (ref 65–99)
GLUCOSE-CAPILLARY: 148 mg/dL — AB (ref 65–99)
GLUCOSE-CAPILLARY: 221 mg/dL — AB (ref 65–99)

## 2016-04-28 LAB — CULTURE, BLOOD (ROUTINE X 2)
CULTURE: NO GROWTH
Culture: NO GROWTH
Culture: NO GROWTH

## 2016-04-28 MED ORDER — SODIUM CHLORIDE 0.9% FLUSH
10.0000 mL | INTRAVENOUS | Status: DC | PRN
  Administered 2016-04-28: 10 mL

## 2016-04-28 MED ORDER — NYSTATIN 100000 UNIT/GM EX POWD: Freq: Three times a day (TID) | CUTANEOUS | 1 refills | Status: AC

## 2016-04-28 NOTE — Progress Notes (Signed)
[facility] to be D/C'd Home per MD order. Discussed with the patient and all questions fully answered.    Medication List    TAKE these medications   aspirin EC 81 MG tablet Take 81 mg by mouth every morning. Reported on 12/05/2015   B-D ULTRAFINE III SHORT PEN 31G X 8 MM Misc Generic drug:  Insulin Pen Needle USE AS DIRECTED TWICE DAILY   Dexlansoprazole 30 MG capsule Commonly known as:  DEXILANT TAKE 1 CAPSULE(30 MG) BY MOUTH DAILY   docusate sodium 250 MG capsule Commonly known as:  COLACE Take 250 mg by mouth daily.   DULoxetine 60 MG capsule Commonly known as:  CYMBALTA Take 1 capsule (60 mg total) by mouth daily.   ertapenem 1 g in sodium chloride 0.9 % 50 mL Inject 1 g into the vein daily.   furosemide 40 MG tablet Commonly known as:  LASIX Take 1 tablet (40 mg total) by mouth daily.   HUMULIN R U-500 KWIKPEN 500 UNIT/ML kwikpen Generic drug:  insulin regular human CONCENTRATED Inject 25-105 Units into the skin 3 (three) times daily. Sliding scale instructions per [facility]: "65+5 units at breakfast, 45+5 units at lunch and 45+5 units at supper." This means: CBG <70 = 0 units CBG 70-90 = 35 units if breakfast, 25 units if lunch or dinner. CBG 91-130 = base amount(65 units if breakfast, 45 units if lunch or dinner) CBG 131-150 = base amount + 5 units CBG 151-200 = + 5 more units CBG 201-250 = + 5 more units CBG 251-300 = + 5 more units CBG 301-350 = + 5 more units CBG 351-400 = + 5 more units CBG 401-450 = + 5 more units CBG >450 = + 5 more units   nystatin powder Commonly known as:  MYCOSTATIN/NYSTOP Apply topically 3 (three) times daily. What changed:  how much to take  when to take this  reasons to take this   ondansetron 4 MG disintegrating tablet Commonly known as:  ZOFRAN ODT Take 1 tablet (4 mg total) by mouth every 8 (eight) hours as needed for nausea or vomiting.   oxyCODONE 5 MG immediate release tablet Commonly known as:   ROXICODONE Limit 3 - 6  tablets by mouth per day if tolerated What changed:  how much to take  when to take this  additional instructions   pregabalin 100 MG capsule Commonly known as:  LYRICA TAKE 100 MG BY MOUTH THREE TIMES DAILY   promethazine 25 MG tablet Commonly known as:  PHENERGAN TAKE 1 TABLET(25 MG) BY MOUTH EVERY 8 HOURS AS NEEDED FOR NAUSEA OR VOMITING   tamsulosin 0.4 MG Caps capsule Commonly known as:  FLOMAX Take 1 capsule (0.4 mg total) by mouth daily after supper.   traZODone 100 MG tablet Commonly known as:  DESYREL TAKE 1 TABLET BY MOUTH AT AT BEDTIME   zolpidem 5 MG tablet Commonly known as:  AMBIEN Take 1 tablet (5 mg total) by mouth at bedtime as needed for sleep.       VVS, Skin clean, dry and intact without evidence of skin break down, no evidence of skin tears noted.  IV catheter discontinued intact. Site without signs and symptoms of complications. Dressing and pressure applied.  An After Visit Summary was printed and given to the patient.  Patient escorted via [place], and D/C home via private auto.  [name]  04/28/2016 6:29 PM

## 2016-04-28 NOTE — Progress Notes (Signed)
Carolynn Sayers RN with [facility] called today for possible discharge home today with IV abx; she will see the pt today to make final arrangements. Mindi Slicker [facility] [phone]

## 2016-04-28 NOTE — Discharge Summary (Signed)
Physician Discharge Summary  [name] [facility] [name] DOB: [birth year] DOA: 04/22/2016  PCP: Wendie Agreste, MD  Admit date: 04/22/2016 Discharge date: 04/28/2016  Admitted From: HOme Disposition:  HOme.   Recommendations for Outpatient Follow-up:  1. Follow up with PCP in 1-2 weeks 2. Please obtain BMP/CBC in one week 3. Please follow up on the following pending results:  Home Health:YES  Discharge [place]. CODE STATUS:FULL  Diet recommendation: Heart Healthy / Carb Modified    Brief/Interim Summary: 65 y.o.malewith PMH of IDDM, dCHF, Chronic anemia and history of nephrolithiasisrequiring nephrostomy tube and surgery in April of this year and was subsequently admitted for pyelonephritis in May of this year [facility]sto the [facility] of confusion and subjective feeling of fever and chills with abdominal discomfort. As per the patient's wife who provided the history patient was getting periods of confusion and lower abdominal discomfort and back pain over the last 3 days.  -Admitted with urinary tract infection. Patient was tachycardic and labs revealed mildly elevated lactic acid with leukocytosis. CT of the abdomen and pelvis shows thickened urinary bladder wall and possible air. Also showed nephrolithiasis. Patient was given fluid bolus for sepsis secondary to UTI and admitted for further management. Urine cultures is positive for ESBL. Infectious disease recommeded iv atx for 2 weeks. Urology recommended antibiotic treatment and f/u as outpatient for nephrolithiasis.   Discharge Diagnoses:  Principal Problem:   Sepsis ([place]) Active Problems:   Uncontrolled type 2 diabetes mellitus with hyperglycemia (HCC)   Chronic anemia   Complicated UTI (urinary tract infection)   Acute cystitis without hematuria   ESBL (extended spectrum beta-lactamase) producing bacteria infection   Acute cystitis with hematuria  Sepsis secondary to UTI. Prelim urine culture: ESBL e coli. Blood  cultures: NGTD.  -Patient received Imipenem iv 8/9 >>8/11.  ID recommended PICC iv ertapenem for 14 days last dose 8/23.  -afebrile >24 hrs, PICC line placed for IV antibiotics till 8/23., consult SW to arrange [place], iv antibiotics   Nephrolithiasis. Urology on board, appreciate input. Doesn't believe causing obstruction at this time. But needs stone removal to prevent reinfection. Urology plans outpatient follow up   Dm2. On U 500 insulin at home. Recent ha1c-7.3 (11/2015). Cont insulin regimen, monitor closely  Hx of TIA. CAD.dCHF. No acute cardiopulmonary or foal neuro symptoms. cont aspirin, resume lasix.  R BKA stable  Discharge Instructions  Discharge Instructions    Diet - low sodium heart healthy    Complete by:  As directed   Discharge instructions    Complete by:  As directed   Please follow up with urology as recommended in one week,  Please get the PICC line removed after antibiotics or after recommendations from urology.  Please get BMP done in 2 to 3 days to check potassium Please complete antibiotics by 8/23.       Medication List    TAKE these medications   aspirin EC 81 MG tablet Take 81 mg by mouth every morning. Reported on 12/05/2015   B-D ULTRAFINE III SHORT PEN 31G X 8 MM Misc Generic drug:  Insulin Pen Needle USE AS DIRECTED TWICE DAILY   Dexlansoprazole 30 MG capsule Commonly known as:  DEXILANT TAKE 1 CAPSULE(30 MG) BY MOUTH DAILY   docusate sodium 250 MG capsule Commonly known as:  COLACE Take 250 mg by mouth daily.   DULoxetine 60 MG capsule Commonly known as:  CYMBALTA Take 1 capsule (60 mg total) by mouth daily.   ertapenem 1 g in sodium chloride  0.9 % 50 mL Inject 1 g into the vein daily.   furosemide 40 MG tablet Commonly known as:  LASIX Take 1 tablet (40 mg total) by mouth daily.   HUMULIN R U-500 KWIKPEN 500 UNIT/ML kwikpen Generic drug:  insulin regular human CONCENTRATED Inject 25-105 Units into the skin 3 (three) times daily.  Sliding scale instructions per [facility]: "65+5 units at breakfast, 45+5 units at lunch and 45+5 units at supper." This means: CBG <70 = 0 units CBG 70-90 = 35 units if breakfast, 25 units if lunch or dinner. CBG 91-130 = base amount(65 units if breakfast, 45 units if lunch or dinner) CBG 131-150 = base amount + 5 units CBG 151-200 = + 5 more units CBG 201-250 = + 5 more units CBG 251-300 = + 5 more units CBG 301-350 = + 5 more units CBG 351-400 = + 5 more units CBG 401-450 = + 5 more units CBG >450 = + 5 more units   nystatin powder Commonly known as:  MYCOSTATIN/NYSTOP Apply topically 3 (three) times daily. What changed:  how much to take  when to take this  reasons to take this   ondansetron 4 MG disintegrating tablet Commonly known as:  ZOFRAN ODT Take 1 tablet (4 mg total) by mouth every 8 (eight) hours as needed for nausea or vomiting.   oxyCODONE 5 MG immediate release tablet Commonly known as:  ROXICODONE Limit 3 - 6  tablets by mouth per day if tolerated What changed:  how much to take  when to take this  additional instructions   pregabalin 100 MG capsule Commonly known as:  LYRICA TAKE 100 MG BY MOUTH THREE TIMES DAILY   promethazine 25 MG tablet Commonly known as:  PHENERGAN TAKE 1 TABLET(25 MG) BY MOUTH EVERY 8 HOURS AS NEEDED FOR NAUSEA OR VOMITING   tamsulosin 0.4 MG Caps capsule Commonly known as:  FLOMAX Take 1 capsule (0.4 mg total) by mouth daily after supper.   traZODone 100 MG tablet Commonly known as:  DESYREL TAKE 1 TABLET BY MOUTH AT AT BEDTIME   zolpidem 5 MG tablet Commonly known as:  AMBIEN Take 1 tablet (5 mg total) by mouth at bedtime as needed for sleep.      Follow-up Information    [facility] .   Why:  Home health for registered nurse for IV abx; please call number listed above for any questions.  Contact information: [address] [place] [address] [phone]         Wendie Agreste, MD. Schedule an appointment as soon as possible for a visit in 1 week(s).   Specialties:  Family Medicine, Sports Medicine Contact information: [place] [id number] [phone]          Allergies  Allergen Reactions  . Bactrim [Sulfamethoxazole-Trimethoprim] Other (See Comments)    Blisters in his mouth  . Symbicort [Budesonide-Formoterol Fumarate] Other (See Comments)    Hyperglycemia   . Advair Diskus [Fluticasone-Salmeterol] Other (See Comments)    hyperglycemia  . Hydromorphone Nausea And Vomiting    Itching   . Morphine Itching  . Soap Itching and Other (See Comments)    Other Reaction: ivory soap=itching     Consultations:  ID   Procedures/Studies: Dg Chest 2 View  Result Date: 04/22/2016 CLINICAL DATA:  Acute onset of fever and generalized chest pain. Initial encounter. EXAM: CHEST  2 VIEW COMPARISON:  Chest radiograph performed 12/21/2015 FINDINGS: The lungs are well-aerated and  clear. There is no evidence of focal opacification, pleural effusion or pneumothorax. Chronic pleural thickening is noted along the right lateral chest wall. Central scarring is noted overlying the right hilum. The heart is normal in size; the mediastinal contour is within normal limits. No acute osseous abnormalities are seen. Scattered clips are seen overlying the left axilla and left breast. Heterotopic bone formation is noted about the proximal right humerus. IMPRESSION: No acute cardiopulmonary process seen. Chronic lung changes again noted. Electronically Signed   By: Garald Balding M.D.   On: 04/22/2016 21:23   Ct Abdomen Pelvis W Contrast  Result Date: 04/23/2016 CLINICAL DATA:  Acute onset of generalized abdominal pain and fever. Initial encounter. EXAM: CT ABDOMEN AND PELVIS WITH CONTRAST TECHNIQUE: Multidetector CT imaging of the abdomen and pelvis was performed using the standard protocol following bolus administration of intravenous contrast.  CONTRAST:  100 mL of Isovue 300 IV contrast COMPARISON:  CT of the abdomen and pelvis from 01/31/2016 FINDINGS: The visualized lung bases are clear. A moderate to large hiatal hernia is noted, with associated postoperative change. The liver and spleen are unremarkable in appearance. The gallbladder is within normal limits. The pancreas and adrenal glands are unremarkable. A 1.0 cm stone is noted at the right renal pelvis. There is mild prominence of the right ureter. Mild nonspecific perinephric stranding is noted bilaterally. Additional nonobstructing bilateral renal stones are seen. A few small bilateral renal cysts are noted. No free fluid is identified. The small bowel is unremarkable in appearance. The stomach is within normal limits. No acute vascular abnormalities are seen. Mild scattered calcification is seen along the abdominal aorta and its branches. The appendix is not definitely characterized; there is no evidence of appendicitis. The colon is grossly unremarkable in appearance. The bladder is mildly thick walled, raising concern for cystitis. A small amount of air is noted within the bladder, which may reflect recent Foley catheter placement. Would correlate for any evidence of emphysematous cystitis. The prostate is enlarged, measuring 5.2 cm in transverse dimension. No inguinal lymphadenopathy is seen. No acute osseous abnormalities are identified. IMPRESSION: 1. Mildly thick walled bladder raises concern for cystitis. Small amount of air within the bladder may reflect recent Foley catheter placement. Would correlate for any evidence of emphysematous cystitis. 2. Mild prominence of the right ureter, without evidence of hydronephrosis. 1.0 cm stone at the right renal pelvis, without definite evidence of obstructing stone. Additional smaller nonobstructing bilateral renal stones seen. 3. Moderate to large hiatal hernia, with associated postoperative change. 4. Small bilateral renal cysts seen. 5. Mild  scattered calcification along the abdominal aorta and its branches. 6. Enlarged prostate noted. Electronically Signed   By: Garald Balding M.D.   On: 04/23/2016 00:22     Subjective:  No complaints. Discharge Exam: Vitals:   04/27/16 2026 04/28/16 0615  BP: (!) 143/61 (!) 146/57  Pulse: 80 65  Resp: 18 18  Temp: 99.3 F (37.4 C) 98.8 F (37.1 C)   Vitals:   04/27/16 0440 04/27/16 1353 04/27/16 2026 04/28/16 0615  BP: (!) 146/61 (!) 146/64 (!) 143/61 (!) 146/57  Pulse: 88 82 80 65  Resp: 18 18 18 18   Temp: 98.8 F (37.1 C) 99.1 F (37.3 C) 99.3 F (37.4 C) 98.8 F (37.1 C)  TempSrc: Oral Oral Oral Oral  SpO2: 98% 99% 98% 100%  Weight: 124.1 kg (273 lb 8 oz)   122.9 kg (270 lb 15.1 oz)  Height:  General: Pt is alert, awake, not in acute distress Cardiovascular: RRR, S1/S2 +, no rubs, no gallops Respiratory: CTA bilaterally, no wheezing, no rhonchi Abdominal: Soft, NT, ND, bowel sounds + Extremities: no edema, no cyanosis    The results of significant diagnostics from this hospitalization (including imaging, microbiology, ancillary and laboratory) are listed below for reference.     Microbiology: Recent Results (from the past 240 hour(s))  Blood Culture (routine x 2)     Status: None   Collection Time: 04/22/16  8:20 PM  Result Value Ref Range Status   Specimen Description BLOOD LEFT ANTECUBITAL  Final   Special Requests BOTTLES DRAWN AEROBIC AND ANAEROBIC 5CC  Final   Culture NO GROWTH 5 DAYS  Final   Report Status 04/27/2016 FINAL  Final  Urine culture     Status: Abnormal   Collection Time: 04/22/16  8:37 PM  Result Value Ref Range Status   Specimen Description URINE, RANDOM  Final   Special Requests NONE  Final   Culture (A)  Final    >=100,000 COLONIES/mL ESCHERICHIA COLI Confirmed Extended Spectrum Beta-Lactamase Producer (ESBL)    Report Status 04/25/2016 FINAL  Final   Organism ID, Bacteria ESCHERICHIA COLI (A)  Final      Susceptibility    Escherichia coli - MIC*    AMPICILLIN >=32 RESISTANT Resistant     CEFAZOLIN >=64 RESISTANT Resistant     CEFTRIAXONE >=64 RESISTANT Resistant     CIPROFLOXACIN >=4 RESISTANT Resistant     GENTAMICIN <=1 SENSITIVE Sensitive     IMIPENEM <=0.25 SENSITIVE Sensitive     NITROFURANTOIN <=16 SENSITIVE Sensitive     TRIMETH/SULFA <=20 SENSITIVE Sensitive     AMPICILLIN/SULBACTAM 16 INTERMEDIATE Intermediate     PIP/TAZO <=4 SENSITIVE Sensitive     Extended ESBL POSITIVE Resistant     * >=100,000 COLONIES/mL ESCHERICHIA COLI  Blood Culture (routine x 2)     Status: None (Preliminary result)   Collection Time: 04/22/16  9:35 PM  Result Value Ref Range Status   Specimen Description BLOOD RIGHT ARM  Final   Special Requests AEROBIC BOTTLE ONLY 10ML  Final   Culture NO GROWTH 4 DAYS  Final   Report Status PENDING  Incomplete  Culture, blood (routine x 2)     Status: None (Preliminary result)   Collection Time: 04/23/16 11:15 AM  Result Value Ref Range Status   Specimen Description BLOOD RIGHT ARM  Final   Special Requests BOTTLES DRAWN AEROBIC AND ANAEROBIC  5CC  Final   Culture NO GROWTH 4 DAYS  Final   Report Status PENDING  Incomplete  Culture, blood (routine x 2)     Status: None (Preliminary result)   Collection Time: 04/23/16 11:35 AM  Result Value Ref Range Status   Specimen Description BLOOD RIGHT ARM  Final   Special Requests BOTTLES DRAWN AEROBIC AND ANAEROBIC  5CC  Final   Culture NO GROWTH 4 DAYS  Final   Report Status PENDING  Incomplete     Labs: BNP (last 3 results)  Recent Labs  12/22/15 0330  BNP [id number]   Basic Metabolic Panel:  Recent Labs Lab 04/22/16 2020 04/23/16 0726 04/24/16 0442 04/25/16 0224  NA 128* 133* 135 134*  K 3.6 4.0 3.3* 3.3*  CL 98* 102 106 104  CO2 21* 21* 24 24  GLUCOSE 296* 314* 58* 179*  BUN 12 10 9 9   CREATININE 1.06 0.92 0.88 0.83  CALCIUM 9.7 9.2 9.3 9.0   Liver Function Tests:  Recent Labs Lab 04/22/16 2020 04/23/16 0726  04/24/16 0442 04/25/16 0224  AST 23 19 25 27   ALT 19 16* 17 20  ALKPHOS 131* 110 85 79  BILITOT 1.7* 1.1 0.5 0.6  PROT 6.8 6.0* 5.7* 5.3*  ALBUMIN 3.6 3.0* 2.8* 2.6*    Recent Labs Lab 04/22/16 2020  LIPASE 13   No results for input(s): AMMONIA in the last 168 hours. CBC:  Recent Labs Lab 04/22/16 2020 04/23/16 0726 04/24/16 0442 04/25/16 0224  WBC 13.1* 10.2 8.7 7.0  NEUTROABS 11.0* 7.5  --   --   HGB 12.2* 10.8* 9.8* 9.2*  HCT 36.2* 32.6* 29.5* 28.4*  MCV 98.6 101.2* 100.0 99.0  PLT 296 256 256 263   Cardiac Enzymes: No results for input(s): CKTOTAL, CKMB, CKMBINDEX, TROPONINI in the last 168 hours. BNP: Invalid input(s): POCBNP CBG:  Recent Labs Lab 04/27/16 1126 04/27/16 1624 04/27/16 2111 04/28/16 0614 04/28/16 1105  GLUCAP 164* 158* 176* 148* 221*   D-Dimer No results for input(s): DDIMER in the last 72 hours. Hgb A1c No results for input(s): HGBA1C in the last 72 hours. Lipid Profile No results for input(s): CHOL, HDL, LDLCALC, TRIG, CHOLHDL, LDLDIRECT in the last 72 hours. Thyroid function studies No results for input(s): TSH, T4TOTAL, T3FREE, THYROIDAB in the last 72 hours.  Invalid input(s): FREET3 Anemia work up No results for input(s): VITAMINB12, FOLATE, FERRITIN, TIBC, IRON, RETICCTPCT in the last 72 hours. Urinalysis    Component Value Date/Time   COLORURINE YELLOW 04/22/2016 2017   APPEARANCEUR CLOUDY (A) 04/22/2016 2017   LABSPEC 1.025 04/22/2016 2017   PHURINE 6.0 04/22/2016 2017   GLUCOSEU >1000 (A) 04/22/2016 2017   GLUCOSEU 500 03/25/2012 1101   HGBUR MODERATE (A) 04/22/2016 2017   BILIRUBINUR NEGATIVE 04/22/2016 2017   BILIRUBINUR negative 10/31/2015 1514   BILIRUBINUR neg 01/19/2013 1125   KETONESUR 15 (A) 04/22/2016 2017   PROTEINUR 100 (A) 04/22/2016 2017   UROBILINOGEN >=8.0 10/31/2015 1514   UROBILINOGEN 1.0 03/25/2012 1101   NITRITE NEGATIVE 04/22/2016 2017   LEUKOCYTESUR MODERATE (A) 04/22/2016 2017   Sepsis  Labs Invalid input(s): PROCALCITONIN,  WBC,  LACTICIDVEN Microbiology Recent Results (from the past 240 hour(s))  Blood Culture (routine x 2)     Status: None   Collection Time: 04/22/16  8:20 PM  Result Value Ref Range Status   Specimen Description BLOOD LEFT ANTECUBITAL  Final   Special Requests BOTTLES DRAWN AEROBIC AND ANAEROBIC 5CC  Final   Culture NO GROWTH 5 DAYS  Final   Report Status 04/27/2016 FINAL  Final  Urine culture     Status: Abnormal   Collection Time: 04/22/16  8:37 PM  Result Value Ref Range Status   Specimen Description URINE, RANDOM  Final   Special Requests NONE  Final   Culture (A)  Final    >=100,000 COLONIES/mL ESCHERICHIA COLI Confirmed Extended Spectrum Beta-Lactamase Producer (ESBL)    Report Status 04/25/2016 FINAL  Final   Organism ID, Bacteria ESCHERICHIA COLI (A)  Final      Susceptibility   Escherichia coli - MIC*    AMPICILLIN >=32 RESISTANT Resistant     CEFAZOLIN >=64 RESISTANT Resistant     CEFTRIAXONE >=64 RESISTANT Resistant     CIPROFLOXACIN >=4 RESISTANT Resistant     GENTAMICIN <=1 SENSITIVE Sensitive     IMIPENEM <=0.25 SENSITIVE Sensitive     NITROFURANTOIN <=16 SENSITIVE Sensitive     TRIMETH/SULFA <=20 SENSITIVE Sensitive     AMPICILLIN/SULBACTAM 16 INTERMEDIATE Intermediate  PIP/TAZO <=4 SENSITIVE Sensitive     Extended ESBL POSITIVE Resistant     * >=100,000 COLONIES/mL ESCHERICHIA COLI  Blood Culture (routine x 2)     Status: None (Preliminary result)   Collection Time: 04/22/16  9:35 PM  Result Value Ref Range Status   Specimen Description BLOOD RIGHT ARM  Final   Special Requests AEROBIC BOTTLE ONLY 10ML  Final   Culture NO GROWTH 4 DAYS  Final   Report Status PENDING  Incomplete  Culture, blood (routine x 2)     Status: None (Preliminary result)   Collection Time: 04/23/16 11:15 AM  Result Value Ref Range Status   Specimen Description BLOOD RIGHT ARM  Final   Special Requests BOTTLES DRAWN AEROBIC AND ANAEROBIC   5CC  Final   Culture NO GROWTH 4 DAYS  Final   Report Status PENDING  Incomplete  Culture, blood (routine x 2)     Status: None (Preliminary result)   Collection Time: 04/23/16 11:35 AM  Result Value Ref Range Status   Specimen Description BLOOD RIGHT ARM  Final   Special Requests BOTTLES DRAWN AEROBIC AND ANAEROBIC  5CC  Final   Culture NO GROWTH 4 DAYS  Final   Report Status PENDING  Incomplete     Time coordinating discharge: Over 30 minutes  SIGNED:   Hosie Poisson, MD  [facility] 04/28/2016, 11:31 AM Pager   If 7PM-7AM, please contact night-coverage [email or web address] Password [email or web address]

## 2016-05-06 ENCOUNTER — Telehealth: Payer: Self-pay

## 2016-05-06 ENCOUNTER — Encounter: Payer: Self-pay | Admitting: Pain Medicine

## 2016-05-06 ENCOUNTER — Ambulatory Visit: Payer: Medicare Other | Attending: Pain Medicine | Admitting: Pain Medicine

## 2016-05-06 VITALS — BP 131/48 | HR 79 | Temp 98.2°F | Resp 16 | Ht 73.0 in | Wt 275.0 lb

## 2016-05-06 DIAGNOSIS — Z89519 Acquired absence of unspecified leg below knee: Secondary | ICD-10-CM | POA: Insufficient documentation

## 2016-05-06 DIAGNOSIS — M5136 Other intervertebral disc degeneration, lumbar region: Secondary | ICD-10-CM | POA: Insufficient documentation

## 2016-05-06 DIAGNOSIS — M62830 Muscle spasm of back: Secondary | ICD-10-CM | POA: Insufficient documentation

## 2016-05-06 DIAGNOSIS — M5137 Other intervertebral disc degeneration, lumbosacral region: Secondary | ICD-10-CM

## 2016-05-06 DIAGNOSIS — M2578 Osteophyte, vertebrae: Secondary | ICD-10-CM | POA: Insufficient documentation

## 2016-05-06 DIAGNOSIS — M791 Myalgia: Secondary | ICD-10-CM | POA: Diagnosis not present

## 2016-05-06 DIAGNOSIS — M5127 Other intervertebral disc displacement, lumbosacral region: Secondary | ICD-10-CM | POA: Insufficient documentation

## 2016-05-06 DIAGNOSIS — A419 Sepsis, unspecified organism: Secondary | ICD-10-CM | POA: Diagnosis not present

## 2016-05-06 DIAGNOSIS — M47817 Spondylosis without myelopathy or radiculopathy, lumbosacral region: Secondary | ICD-10-CM | POA: Diagnosis not present

## 2016-05-06 DIAGNOSIS — M5126 Other intervertebral disc displacement, lumbar region: Secondary | ICD-10-CM | POA: Diagnosis not present

## 2016-05-06 DIAGNOSIS — M4806 Spinal stenosis, lumbar region: Secondary | ICD-10-CM | POA: Diagnosis not present

## 2016-05-06 DIAGNOSIS — N209 Urinary calculus, unspecified: Secondary | ICD-10-CM | POA: Insufficient documentation

## 2016-05-06 DIAGNOSIS — M19011 Primary osteoarthritis, right shoulder: Secondary | ICD-10-CM

## 2016-05-06 DIAGNOSIS — M533 Sacrococcygeal disorders, not elsewhere classified: Secondary | ICD-10-CM | POA: Diagnosis not present

## 2016-05-06 DIAGNOSIS — N390 Urinary tract infection, site not specified: Secondary | ICD-10-CM | POA: Diagnosis not present

## 2016-05-06 DIAGNOSIS — M5416 Radiculopathy, lumbar region: Secondary | ICD-10-CM | POA: Diagnosis not present

## 2016-05-06 DIAGNOSIS — G9520 Unspecified cord compression: Secondary | ICD-10-CM | POA: Diagnosis not present

## 2016-05-06 DIAGNOSIS — M5124 Other intervertebral disc displacement, thoracic region: Secondary | ICD-10-CM | POA: Diagnosis not present

## 2016-05-06 DIAGNOSIS — G546 Phantom limb syndrome with pain: Secondary | ICD-10-CM | POA: Diagnosis not present

## 2016-05-06 DIAGNOSIS — M5134 Other intervertebral disc degeneration, thoracic region: Secondary | ICD-10-CM | POA: Insufficient documentation

## 2016-05-06 DIAGNOSIS — M19012 Primary osteoarthritis, left shoulder: Secondary | ICD-10-CM

## 2016-05-06 DIAGNOSIS — Z981 Arthrodesis status: Secondary | ICD-10-CM | POA: Diagnosis not present

## 2016-05-06 DIAGNOSIS — E119 Type 2 diabetes mellitus without complications: Secondary | ICD-10-CM | POA: Insufficient documentation

## 2016-05-06 DIAGNOSIS — M545 Low back pain: Secondary | ICD-10-CM | POA: Diagnosis present

## 2016-05-06 DIAGNOSIS — M47816 Spondylosis without myelopathy or radiculopathy, lumbar region: Secondary | ICD-10-CM

## 2016-05-06 MED ORDER — OXYCODONE HCL 5 MG PO TABS: ORAL_TABLET | ORAL | 0 refills | Status: DC

## 2016-05-06 NOTE — Progress Notes (Signed)
Safety precautions to be maintained throughout the outpatient stay will include: orient to surroundings, keep bed in low position, maintain call bell within reach at all times, provide assistance with transfer out of bed and ambulation. Pt has pic line for iv antibiotics  [facility] in [facility]i  Sepsis ,hematuria and cystitis has kidney stone on iv invanz q 24 hours at home  Via pic line

## 2016-05-06 NOTE — Telephone Encounter (Signed)
Pt has just been release from hospital and has at home iv fluids and has not had a bowel movement in several days and was wanting to know If something could be called in   Best number [phone]

## 2016-05-06 NOTE — Patient Instructions (Addendum)
PLAN   Continue present medication oxycodone. PLEASE NOTE that you have a 5 mg size oxycodone. May consider Movantik as we discussed pending approval by PCP  F/U PCP Dr. Alyson Ingles for evaliation of  BP,  UTI diabetes mellitus, lower extremity swelling, constipation and general medical  condition. . Follow-up with Dr. Carlota Raspberry or Dr. Alyson Ingles regarding these conditions as discussed    May consider Movantik or constipation eye of the there are significant risk regarding this medication  F/U surgical evaluation. May consider further evaluation pending follow-up evaluations  F/U cardiologist as discussed  F/U neurological evaluation for questionable TIA versus CVA as previously discussed May consider PNCV/EMG studies and other studies pending follow-up evaluations  F/U urological evaluation status post urological surgery as discussed  May consider radiofrequency rhizolysis or intraspinal procedures pending response to present treatment and F/U evaluation.. We will avoid such procedures at this time as well as other aspects of treatment regimen  The patient will continue under the care of Dr. Carlota Raspberry his primary care physician as well as his urologist at this time.  The patient is to call pain management prior to scheduled return appointment for any concerns regarding condition
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
## 2016-05-09 ENCOUNTER — Other Ambulatory Visit: Payer: Self-pay | Admitting: Urology

## 2016-05-12 ENCOUNTER — Encounter: Payer: Self-pay | Admitting: Family Medicine

## 2016-05-15 DIAGNOSIS — N200 Calculus of kidney: Secondary | ICD-10-CM | POA: Diagnosis not present

## 2016-05-16 ENCOUNTER — Other Ambulatory Visit: Payer: Self-pay | Admitting: Family Medicine

## 2016-05-19 DIAGNOSIS — I10 Essential (primary) hypertension: Secondary | ICD-10-CM | POA: Diagnosis not present

## 2016-05-19 DIAGNOSIS — D649 Anemia, unspecified: Secondary | ICD-10-CM | POA: Diagnosis not present

## 2016-05-20 ENCOUNTER — Encounter (HOSPITAL_BASED_OUTPATIENT_CLINIC_OR_DEPARTMENT_OTHER): Payer: Self-pay | Admitting: *Deleted

## 2016-05-20 NOTE — Progress Notes (Signed)
NPO AFTER MN.  ARRIVE AT 0945.  PT HAS PICC LINE, NEED IV THERAPY TO ACCESS.  NEEDS ISTAT 8.  CURRENT EKG IN CHART IN EPIC AND EPIC.  WILL TAKE LYRICA AND CYMBALTA AM DOS W/ SIPS OF WATER AND IF NEEDED TAKE OXYCODONE.

## 2016-05-21 ENCOUNTER — Telehealth: Payer: Self-pay | Admitting: *Deleted

## 2016-05-22 ENCOUNTER — Ambulatory Visit (HOSPITAL_BASED_OUTPATIENT_CLINIC_OR_DEPARTMENT_OTHER): Payer: Medicare Other | Admitting: Anesthesiology

## 2016-05-22 ENCOUNTER — Encounter (HOSPITAL_BASED_OUTPATIENT_CLINIC_OR_DEPARTMENT_OTHER)
Admission: RE | Disposition: A | Discharge status: Home / Self Care | Payer: Self-pay | Source: Ambulatory Visit | Attending: Urology

## 2016-05-22 ENCOUNTER — Ambulatory Visit (HOSPITAL_BASED_OUTPATIENT_CLINIC_OR_DEPARTMENT_OTHER)
Admission: RE | Admit: 2016-05-22 | Discharge: 2016-05-22 | Disposition: A | Discharge status: Home / Self Care | Payer: Medicare Other | Source: Ambulatory Visit | Attending: Urology | Admitting: Urology

## 2016-05-22 ENCOUNTER — Encounter (HOSPITAL_BASED_OUTPATIENT_CLINIC_OR_DEPARTMENT_OTHER): Payer: Self-pay | Admitting: *Deleted

## 2016-05-22 DIAGNOSIS — G4730 Sleep apnea, unspecified: Secondary | ICD-10-CM | POA: Insufficient documentation

## 2016-05-22 DIAGNOSIS — I10 Essential (primary) hypertension: Secondary | ICD-10-CM | POA: Insufficient documentation

## 2016-05-22 DIAGNOSIS — N390 Urinary tract infection, site not specified: Secondary | ICD-10-CM | POA: Diagnosis present

## 2016-05-22 DIAGNOSIS — Z87891 Personal history of nicotine dependence: Secondary | ICD-10-CM | POA: Diagnosis not present

## 2016-05-22 DIAGNOSIS — K219 Gastro-esophageal reflux disease without esophagitis: Secondary | ICD-10-CM | POA: Diagnosis not present

## 2016-05-22 DIAGNOSIS — E1051 Type 1 diabetes mellitus with diabetic peripheral angiopathy without gangrene: Secondary | ICD-10-CM | POA: Insufficient documentation

## 2016-05-22 DIAGNOSIS — E1039 Type 1 diabetes mellitus with other diabetic ophthalmic complication: Secondary | ICD-10-CM | POA: Insufficient documentation

## 2016-05-22 DIAGNOSIS — N200 Calculus of kidney: Secondary | ICD-10-CM | POA: Diagnosis not present

## 2016-05-22 DIAGNOSIS — Z7982 Long term (current) use of aspirin: Secondary | ICD-10-CM | POA: Insufficient documentation

## 2016-05-22 DIAGNOSIS — Z794 Long term (current) use of insulin: Secondary | ICD-10-CM | POA: Insufficient documentation

## 2016-05-22 DIAGNOSIS — Z79899 Other long term (current) drug therapy: Secondary | ICD-10-CM | POA: Diagnosis not present

## 2016-05-22 DIAGNOSIS — I2510 Atherosclerotic heart disease of native coronary artery without angina pectoris: Secondary | ICD-10-CM | POA: Insufficient documentation

## 2016-05-22 HISTORY — DX: Polyneuropathy, unspecified: G62.9

## 2016-05-22 HISTORY — DX: Personal history of other infectious and parasitic diseases: Z86.19

## 2016-05-22 HISTORY — DX: Other intervertebral disc degeneration, lumbar region: M51.36

## 2016-05-22 HISTORY — DX: Anodontia: K00.0

## 2016-05-22 HISTORY — DX: Chronic obstructive pulmonary disease, unspecified: J44.9

## 2016-05-22 HISTORY — DX: Hyperlipidemia, unspecified: E78.5

## 2016-05-22 HISTORY — PX: CYSTOSCOPY/RETROGRADE/URETEROSCOPY/STONE EXTRACTION WITH BASKET: SHX5317

## 2016-05-22 HISTORY — DX: Other reduced mobility: Z74.09

## 2016-05-22 HISTORY — DX: Dependence on wheelchair: Z99.3

## 2016-05-22 HISTORY — PX: CYSTOSCOPY WITH STENT PLACEMENT: SHX5790

## 2016-05-22 HISTORY — DX: Peripheral vascular disease, unspecified: I73.9

## 2016-05-22 HISTORY — DX: Other complications of anesthesia, initial encounter: T88.59XA

## 2016-05-22 HISTORY — DX: Sleep disorder, unspecified: G47.9

## 2016-05-22 HISTORY — DX: Localized edema: R60.0

## 2016-05-22 HISTORY — DX: Calculus of kidney: N20.0

## 2016-05-22 HISTORY — DX: Depression, unspecified: F32.A

## 2016-05-22 HISTORY — DX: Adverse effect of unspecified anesthetic, initial encounter: T41.45XA

## 2016-05-22 HISTORY — DX: Personal history of pneumonia (recurrent): Z87.01

## 2016-05-22 HISTORY — DX: Failed or difficult intubation, initial encounter: T88.4XXA

## 2016-05-22 HISTORY — DX: Other symptoms and signs involving the nervous system: R29.818

## 2016-05-22 HISTORY — DX: Acquired absence of unspecified leg below knee: Z89.519

## 2016-05-22 HISTORY — DX: Unspecified osteoarthritis, unspecified site: M19.90

## 2016-05-22 HISTORY — DX: Other symptoms and signs involving the musculoskeletal system: R29.898

## 2016-05-22 HISTORY — DX: Other forms of dyspnea: R06.09

## 2016-05-22 HISTORY — DX: Cyst of kidney, acquired: N28.1

## 2016-05-22 HISTORY — DX: Other intervertebral disc degeneration, lumbar region without mention of lumbar back pain or lower extremity pain: M51.369

## 2016-05-22 HISTORY — DX: Personal history of other endocrine, nutritional and metabolic disease: Z86.39

## 2016-05-22 HISTORY — DX: Complete loss of teeth, unspecified cause, unspecified class: K08.109

## 2016-05-22 HISTORY — DX: Chronic diastolic (congestive) heart failure: I50.32

## 2016-05-22 HISTORY — PX: HOLMIUM LASER APPLICATION: SHX5852

## 2016-05-22 HISTORY — DX: Personal history of urinary calculi: Z87.442

## 2016-05-22 HISTORY — DX: Chronic pain syndrome: G89.4

## 2016-05-22 HISTORY — DX: Atherosclerotic heart disease of native coronary artery without angina pectoris: I25.10

## 2016-05-22 HISTORY — DX: Spinal stenosis, lumbar region with neurogenic claudication: M48.062

## 2016-05-22 HISTORY — DX: Gastroparesis: K31.84

## 2016-05-22 HISTORY — DX: Other vascular myelopathies: G95.19

## 2016-05-22 HISTORY — DX: Esophageal obstruction: K22.2

## 2016-05-22 HISTORY — DX: Essential (primary) hypertension: I10

## 2016-05-22 HISTORY — DX: Diaphragmatic hernia without obstruction or gangrene: K44.9

## 2016-05-22 HISTORY — DX: Type 2 diabetes mellitus without complications: E11.9

## 2016-05-22 HISTORY — DX: Male erectile dysfunction, unspecified: N52.9

## 2016-05-22 HISTORY — DX: Benign prostatic hyperplasia with lower urinary tract symptoms: N40.1

## 2016-05-22 HISTORY — DX: Personal history of Methicillin resistant Staphylococcus aureus infection: Z86.14

## 2016-05-22 HISTORY — DX: Other specified personal risk factors, not elsewhere classified: Z91.89

## 2016-05-22 HISTORY — DX: Major depressive disorder, single episode, unspecified: F32.9

## 2016-05-22 LAB — POCT I-STAT, CHEM 8
BUN: 9 mg/dL (ref 6–20)
CALCIUM ION: 1.37 mmol/L (ref 1.15–1.40)
CHLORIDE: 102 mmol/L (ref 101–111)
Creatinine, Ser: 0.8 mg/dL (ref 0.61–1.24)
Glucose, Bld: 257 mg/dL — ABNORMAL HIGH (ref 65–99)
HEMATOCRIT: 33 % — AB (ref 39.0–52.0)
Hemoglobin: 11.2 g/dL — ABNORMAL LOW (ref 13.0–17.0)
POTASSIUM: 3.9 mmol/L (ref 3.5–5.1)
SODIUM: 140 mmol/L (ref 135–145)
TCO2: 28 mmol/L (ref 0–100)

## 2016-05-22 LAB — GLUCOSE, CAPILLARY: Glucose-Capillary: 263 mg/dL — ABNORMAL HIGH (ref 65–99)

## 2016-05-22 SURGERY — CYSTOSCOPY, WITH CALCULUS REMOVAL USING BASKET
Anesthesia: General | Site: Ureter | Laterality: Bilateral

## 2016-05-22 MED ORDER — SUGAMMADEX SODIUM 200 MG/2ML IV SOLN
INTRAVENOUS | Status: DC | PRN
  Administered 2016-05-22: 400 mg via INTRAVENOUS

## 2016-05-22 MED ORDER — DEXTROSE 5 % IV SOLN
2.0000 g | INTRAVENOUS | Status: AC
  Administered 2016-05-22: 2 g via INTRAVENOUS
  Filled 2016-05-22: qty 2

## 2016-05-22 MED ORDER — LACTATED RINGERS IV SOLN
INTRAVENOUS | Status: DC
  Administered 2016-05-22: 11:00:00 via INTRAVENOUS
  Filled 2016-05-22: qty 1000

## 2016-05-22 MED ORDER — OXYCODONE HCL 15 MG PO TABS: ORAL_TABLET | ORAL | 0 refills | Status: DC

## 2016-05-22 MED ORDER — CEFTRIAXONE SODIUM 2 G IJ SOLR
INTRAMUSCULAR | Status: AC
  Filled 2016-05-22: qty 2

## 2016-05-22 MED ORDER — MIDAZOLAM HCL 5 MG/5ML IJ SOLN
INTRAMUSCULAR | Status: DC | PRN
  Administered 2016-05-22: 2 mg via INTRAVENOUS

## 2016-05-22 MED ORDER — ONDANSETRON HCL 4 MG/2ML IJ SOLN
4.0000 mg | Freq: Once | INTRAMUSCULAR | Status: DC | PRN
  Filled 2016-05-22: qty 2

## 2016-05-22 MED ORDER — DEXTROSE 5 % IV SOLN
INTRAVENOUS | Status: AC
  Filled 2016-05-22: qty 50

## 2016-05-22 MED ORDER — FENTANYL CITRATE (PF) 100 MCG/2ML IJ SOLN
INTRAMUSCULAR | Status: AC
  Filled 2016-05-22: qty 4

## 2016-05-22 MED ORDER — TRIMETHOPRIM 100 MG PO TABS: 100.0000 mg | ORAL_TABLET | Freq: Every day | ORAL | 5 refills | Status: DC

## 2016-05-22 MED ORDER — LIDOCAINE 2% (20 MG/ML) 5 ML SYRINGE
INTRAMUSCULAR | Status: DC | PRN
  Administered 2016-05-22: 100 mg via INTRAVENOUS

## 2016-05-22 MED ORDER — MEPERIDINE HCL 25 MG/ML IJ SOLN
6.2500 mg | INTRAMUSCULAR | Status: DC | PRN
  Filled 2016-05-22: qty 1

## 2016-05-22 MED ORDER — SUGAMMADEX SODIUM 200 MG/2ML IV SOLN
INTRAVENOUS | Status: AC
  Filled 2016-05-22: qty 4

## 2016-05-22 MED ORDER — SODIUM CHLORIDE 0.9 % IR SOLN
Status: DC | PRN
  Administered 2016-05-22: 3000 mL via INTRAVESICAL
  Administered 2016-05-22: 1000 mL via INTRAVESICAL
  Administered 2016-05-22: 1000 mL

## 2016-05-22 MED ORDER — ONDANSETRON HCL 4 MG/2ML IJ SOLN
INTRAMUSCULAR | Status: DC | PRN
  Administered 2016-05-22: 4 mg via INTRAVENOUS

## 2016-05-22 MED ORDER — SUCCINYLCHOLINE CHLORIDE 200 MG/10ML IV SOSY
PREFILLED_SYRINGE | INTRAVENOUS | Status: DC | PRN
  Administered 2016-05-22: 100 mg via INTRAVENOUS

## 2016-05-22 MED ORDER — MIDAZOLAM HCL 2 MG/2ML IJ SOLN
INTRAMUSCULAR | Status: AC
  Filled 2016-05-22: qty 2

## 2016-05-22 MED ORDER — IOHEXOL 300 MG/ML  SOLN
INTRAMUSCULAR | Status: DC | PRN
  Administered 2016-05-22: 14 mL via URETHRAL

## 2016-05-22 MED ORDER — PROPOFOL 10 MG/ML IV BOLUS
INTRAVENOUS | Status: DC | PRN
  Administered 2016-05-22: 200 mg via INTRAVENOUS

## 2016-05-22 MED ORDER — FENTANYL CITRATE (PF) 100 MCG/2ML IJ SOLN
INTRAMUSCULAR | Status: DC | PRN
  Administered 2016-05-22 (×2): 50 ug via INTRAVENOUS

## 2016-05-22 MED ORDER — ROCURONIUM BROMIDE 100 MG/10ML IV SOLN
INTRAVENOUS | Status: DC | PRN
  Administered 2016-05-22: 40 mg via INTRAVENOUS

## 2016-05-22 MED ORDER — OXYCODONE HCL 5 MG PO TABS
ORAL_TABLET | ORAL | Status: AC
  Filled 2016-05-22: qty 3

## 2016-05-22 MED ORDER — OXYCODONE HCL 5 MG PO TABS
15.0000 mg | ORAL_TABLET | Freq: Once | ORAL | Status: AC
  Administered 2016-05-22: 15 mg via ORAL
  Filled 2016-05-22: qty 3

## 2016-05-22 SURGICAL SUPPLY — 34 items
BAG DRAIN URO-CYSTO SKYTR STRL (DRAIN) ×4 IMPLANT
BAG DRN UROCATH (DRAIN) ×2
BASKET DAKOTA 1.9FR 11X120 (BASKET) IMPLANT
BASKET LASER NITINOL 1.9FR (BASKET) IMPLANT
BASKET STONE 1.7 NGAGE (UROLOGICAL SUPPLIES) ×2 IMPLANT
BASKET ZERO TIP NITINOL 2.4FR (BASKET) IMPLANT
BSKT STON RTRVL 120 1.9FR (BASKET)
BSKT STON RTRVL ZERO TP 2.4FR (BASKET)
CATH INTERMIT  6FR 70CM (CATHETERS) ×2 IMPLANT
CLOTH BEACON ORANGE TIMEOUT ST (SAFETY) ×4 IMPLANT
EVACUATOR MICROVAS BLADDER (UROLOGICAL SUPPLIES) IMPLANT
FIBER LASER FLEXIVA 1000 (UROLOGICAL SUPPLIES) IMPLANT
FIBER LASER FLEXIVA 200 (UROLOGICAL SUPPLIES) IMPLANT
FIBER LASER FLEXIVA 365 (UROLOGICAL SUPPLIES) IMPLANT
FIBER LASER FLEXIVA 550 (UROLOGICAL SUPPLIES) IMPLANT
FIBER LASER LITHO 273 (Laser) ×2 IMPLANT
FIBER LASER TRAC TIP (UROLOGICAL SUPPLIES) IMPLANT
GLOVE BIO SURGEON STRL SZ8 (GLOVE) ×4 IMPLANT
GOWN STRL REUS W/ TWL LRG LVL3 (GOWN DISPOSABLE) ×2 IMPLANT
GOWN STRL REUS W/ TWL XL LVL3 (GOWN DISPOSABLE) ×2 IMPLANT
GOWN STRL REUS W/TWL LRG LVL3 (GOWN DISPOSABLE) ×4
GOWN STRL REUS W/TWL XL LVL3 (GOWN DISPOSABLE) ×4
GUIDEWIRE ANG ZIPWIRE 038X150 (WIRE) ×4 IMPLANT
GUIDEWIRE STR DUAL SENSOR (WIRE) ×2 IMPLANT
IV NS IRRIG 3000ML ARTHROMATIC (IV SOLUTION) ×4 IMPLANT
KIT ROOM TURNOVER WOR (KITS) ×4 IMPLANT
MANIFOLD NEPTUNE II (INSTRUMENTS) ×2 IMPLANT
PACK CYSTO (CUSTOM PROCEDURE TRAY) ×4 IMPLANT
SHEATH ACCESS URETERAL 38CM (SHEATH) ×2 IMPLANT
STENT URET 6FRX26 CONTOUR (STENTS) ×4 IMPLANT
SYRINGE 10CC LL (SYRINGE) ×4 IMPLANT
TUBE CONNECTING 12'X1/4 (SUCTIONS) ×1
TUBE CONNECTING 12X1/4 (SUCTIONS) ×1 IMPLANT
TUBE FEEDING 8FR 16IN STR KANG (MISCELLANEOUS) IMPLANT

## 2016-05-22 NOTE — Brief Op Note (Signed)
05/22/2016  1:46 PM  PATIENT:  [name]  65 y.o. male  PRE-OPERATIVE DIAGNOSIS:  BILATERAL RENAL CALCULI  POST-OPERATIVE DIAGNOSIS:  BILATERAL RENAL CALCULI  PROCEDURE:  Procedure(s): CYSTOSCOPY/RETROGRADE/URETEROSCOPY/STONE EXTRACTION WITH BASKET (Bilateral) HOLMIUM LASER APPLICATION (Bilateral) CYSTOSCOPY WITH STENT PLACEMENT (Bilateral)  SURGEON:  Surgeon(s) and Role:    * Cleon Gustin, MD - Primary  PHYSICIAN ASSISTANT:   ASSISTANTS: none   ANESTHESIA:   general  EBL:  No intake/output data recorded.  BLOOD ADMINISTERED:none  DRAINS: bilateral 6x26 JJ stents without tethers   LOCAL MEDICATIONS USED:  NONE  SPECIMEN:  Source of Specimen:  bilateral renal calculi  DISPOSITION OF SPECIMEN:  N/A  COUNTS:  YES  TOURNIQUET:  * No tourniquets in log *  DICTATION: .Note written in EPIC  PLAN OF CARE: Discharge to home after PACU  PATIENT DISPOSITION:  PACU - hemodynamically stable.   Delay start of Pharmacological VTE agent (>24hrs) due to surgical blood loss or risk of bleeding: not applicable

## 2016-05-22 NOTE — Interval H&P Note (Signed)
History and Physical Interval Note:  05/22/2016 12:15 PM  [name]  has presented today for surgery, with the diagnosis of BILATERAL RENAL CALCULI  The various methods of treatment have been discussed with the patient and family. After consideration of risks, benefits and other options for treatment, the patient has consented to  Procedure(s): CYSTOSCOPY/RETROGRADE/URETEROSCOPY/STONE EXTRACTION WITH BASKET (Bilateral) HOLMIUM LASER APPLICATION (Bilateral) as a surgical intervention .  The patient's history has been reviewed, patient examined, no change in status, stable for surgery.  I have reviewed the patient's chart and labs.  Questions were answered to the patient's satisfaction.     [name]

## 2016-05-22 NOTE — Transfer of Care (Signed)
Immediate Anesthesia Transfer of Care Note  Patient: [place]  Procedure(s) Performed: Procedure(s): CYSTOSCOPY/RETROGRADE/URETEROSCOPY/STONE EXTRACTION WITH BASKET (Bilateral) HOLMIUM LASER APPLICATION (Bilateral) CYSTOSCOPY WITH STENT PLACEMENT (Bilateral)  Patient Location: PACU  Anesthesia Type:General  Level of Consciousness: awake, alert , oriented and patient cooperative  Airway & Oxygen Therapy: Patient Spontanous Breathing and Patient connected to nasal cannula oxygen  Post-op Assessment: Report given to RN and Post -op Vital signs reviewed and stable  Post vital signs: Reviewed and stable  Last Vitals:  Vitals:   05/22/16 0951  BP: (!) 149/54  Pulse: 78  Resp: 18  Temp: 37.2 C    Last Pain:  Vitals:   05/22/16 1028  TempSrc:   PainSc: 7       Patients Stated Pain Goal: 5 ([id number])  Complications: No apparent anesthesia complications

## 2016-05-22 NOTE — Anesthesia Preprocedure Evaluation (Signed)
Anesthesia Evaluation  Patient identified by MRN, date of birth, ID band Patient awake    Reviewed: Allergy & Precautions, NPO status , Patient's Chart, lab work & pertinent test results  Airway Mallampati: I  TM Distance: >3 FB Neck ROM: Full    Dental   Pulmonary sleep apnea , former smoker,    Pulmonary exam normal        Cardiovascular hypertension, Pt. on medications + CAD  Normal cardiovascular exam     Neuro/Psych Anxiety Depression    GI/Hepatic GERD  Controlled and Medicated,  Endo/Other  diabetes  Renal/GU      Musculoskeletal   Abdominal   Peds  Hematology   Anesthesia Other Findings   Reproductive/Obstetrics                             Anesthesia Physical Anesthesia Plan  ASA: III  Anesthesia Plan: General   Post-op Pain Management:    Induction: Intravenous  Airway Management Planned: LMA  Additional Equipment:   Intra-op Plan:   Post-operative Plan: Extubation in OR  Informed Consent: I have reviewed the patients History and Physical, chart, labs and discussed the procedure including the risks, benefits and alternatives for the proposed anesthesia with the patient or authorized representative who has indicated his/her understanding and acceptance.     Plan Discussed with: CRNA and Surgeon  Anesthesia Plan Comments:         Anesthesia Quick Evaluation
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
## 2016-05-22 NOTE — Anesthesia Procedure Notes (Addendum)
Procedure Name: Intubation Date/Time: 05/22/2016 12:32 PM Performed by: Wanita Chamberlain Pre-anesthesia Checklist: Patient identified, Timeout performed, Emergency Drugs available, Suction available and Patient being monitored Patient Re-evaluated:Patient Re-evaluated prior to inductionOxygen Delivery Method: Circle system utilized Preoxygenation: Pre-oxygenation with 100% oxygen Intubation Type: Rapid sequence and Cricoid Pressure applied Ventilation: Mask ventilation with difficulty and Oral airway inserted - appropriate to patient size Laryngoscope Size: Mac and 3 Grade View: Grade IV Tube type: Oral Tube size: 7.0 mm Number of attempts: 2 Airway Equipment and Method: Patient positioned with wedge pillow and Rigid stylet Placement Confirmation: positive ETCO2 and breath sounds checked- equal and bilateral Secured at: 24 cm Tube secured with: Tape Dental Injury: Teeth and Oropharynx as per pre-operative assessment  Difficulty Due To: Difficulty was anticipated and Difficult Airway- due to reduced neck mobility Future Recommendations: Recommend- induction with short-acting agent, and alternative techniques readily available Comments: Pt with full long beard. Limited neck mobility. Former trach pt.

## 2016-05-22 NOTE — Discharge Instructions (Signed)
°Post Anesthesia Home Care Instructions ° °Activity: °Get plenty of rest for the remainder of the day. A responsible adult should stay with you for 24 hours following the procedure.  °For the next 24 hours, DO NOT: °-Drive a car °-Operate machinery °-Drink alcoholic beverages °-Take any medication unless instructed by your physician °-Make any legal decisions or sign important papers. ° °Meals: °Start with liquid foods such as gelatin or soup. Progress to regular foods as tolerated. Avoid greasy, spicy, heavy foods. If nausea and/or vomiting occur, drink only clear liquids until the nausea and/or vomiting subsides. Call your physician if vomiting continues. ° °Special Instructions/Symptoms: °Your throat may feel dry or sore from the anesthesia or the breathing tube placed in your throat during surgery. If this causes discomfort, gargle with warm salt water. The discomfort should disappear within 24 hours. ° °If you had a scopolamine patch placed behind your ear for the management of post- operative nausea and/or vomiting: ° °1. The medication in the patch is effective for 72 hours, after which it should be removed.  Wrap patch in a tissue and discard in the trash. Wash hands thoroughly with soap and water. °2. You may remove the patch earlier than 72 hours if you experience unpleasant side effects which may include dry mouth, dizziness or visual disturbances. °3. Avoid touching the patch. Wash your hands with soap and water after contact with the patch. °  °[facility] °[phone] °Post Ureteroscopy With or Without Stent Instructions ° °Definitions: ° °Ureter: The duct that transports urine from the kidney to the bladder. °Stent:   A plastic hollow tube that is placed into the ureter, from the kidney to the                 bladder to prevent the ureter from swelling shut. ° °GENERAL INSTRUCTIONS: ° °Despite the fact that no skin incisions were used, the area around the ureter and bladder is raw  and irritated. The stent is a foreign body which will further irritate the bladder wall. This irritation is manifested by increased frequency of urination, both day and night, and by an increase in the urge to urinate. In some, the urge to urinate is present almost always. Sometimes the urge is strong enough that you may not be able to stop yourself from urinating. The only real cure is to remove the stent and then give time for the bladder wall to heal which can't be done until the danger of the ureter swelling shut has passed, which varies. ° °You may see some blood in your urine while the stent is in place and a few days afterwards. Do not be alarmed, even if the urine was clear for a while. Get off your feet and drink lots of fluids until clearing occurs. If you start to pass clots or don't improve, call us. ° °DIET: °You may return to your normal diet immediately. Because of the raw surface of your bladder, alcohol, spicy foods, acid type foods and drinks with caffeine may cause irritation or frequency and should be used in moderation. To keep your urine flowing freely and to avoid constipation, drink plenty of fluids during the day ( 8-10 glasses ). °Tip: Avoid cranberry juice because it is very acidic. ° °ACTIVITY: °Your physical activity doesn't need to be restricted. However, if you are very active, you may see some blood in your urine. We suggest that you reduce your activity under these circumstances until the bleeding has stopped. ° °BOWELS: °  It is important to keep your bowels regular during the postoperative period. Straining with bowel movements can cause bleeding. A bowel movement every other day is reasonable. Use a mild laxative if needed, such as Milk of Magnesia 2-3 tablespoons, or 2 Dulcolax tablets. Call if you continue to have problems. If you have been taking narcotics for pain, before, during or after your surgery, you may be constipated. Take a laxative if necessary. ° ° °MEDICATION: °You  should resume your pre-surgery medications unless told not to. In addition you will often be given an antibiotic to prevent infection. These should be taken as prescribed until the bottles are finished unless you are having an unusual reaction to one of the drugs. ° °PROBLEMS YOU SHOULD REPORT TO US: °· Fevers over 100.5 Fahrenheit. °· Heavy bleeding, or clots ( See above notes about blood in urine ). °· Inability to urinate. °· Drug reactions ( hives, rash, nausea, vomiting, diarrhea ). °· Severe burning or pain with urination that is not improving. ° °FOLLOW-UP: °You will need a follow-up appointment to monitor your progress. Call for this appointment at the number listed above. Usually the first appointment will be about three to fourteen days after your surgery. ° ° ° ° ° °

## 2016-05-22 NOTE — Anesthesia Postprocedure Evaluation (Signed)
Anesthesia Post Note  Patient: [name]  Procedure(s) Performed: Procedure(s) (LRB): CYSTOSCOPY/RETROGRADE/URETEROSCOPY/STONE EXTRACTION WITH BASKET (Bilateral) HOLMIUM LASER APPLICATION (Bilateral) CYSTOSCOPY WITH STENT PLACEMENT (Bilateral)  Patient location during evaluation: PACU Anesthesia Type: General Level of consciousness: awake and alert Pain management: pain level controlled Vital Signs Assessment: post-procedure vital signs reviewed and stable Respiratory status: spontaneous breathing, nonlabored ventilation, respiratory function stable and patient connected to nasal cannula oxygen Cardiovascular status: blood pressure returned to baseline and stable Postop Assessment: no signs of nausea or vomiting Anesthetic complications: no Comments: Denies dyspnea.    Last Vitals:  Vitals:   05/22/16 1415 05/22/16 1430  BP: 138/65 (!) 129/55  Pulse: 83 76  Resp: 17 13  Temp:      Last Pain:  Vitals:   05/22/16 1355  TempSrc:   PainSc: Asleep                 [name] J

## 2016-05-23 ENCOUNTER — Encounter (HOSPITAL_BASED_OUTPATIENT_CLINIC_OR_DEPARTMENT_OTHER): Payer: Self-pay | Admitting: Urology

## 2016-05-28 NOTE — Progress Notes (Unsigned)
Sent/mailed patients wife form that needed to be filled out for [facility] for her work in case she needed to be out with her husband

## 2016-05-29 NOTE — Op Note (Signed)
Marland KitchenPreoperative diagnosis: bilateral renal calculi  Postoperative diagnosis: Same  Procedure: 1 cystoscopy 2. Bilateral retrograde pyelography 3.  Intraoperative fluoroscopy, under one hour, with interpretation 4.  Bilateral ureteroscopic stone manipulation with laser lithotripsy and basket extraction 5.  bilateral 6 x 26 JJ stent placement  Attending: Rosie Fate  Anesthesia: General  Estimated blood loss: None  Drains: bilateral 6 x 26 JJ ureteral stent without tether  Specimens: stone for analysis  Antibiotics: ancef  Findings: bilateral mid and lower pole renal calculi. No hydronephrosis. No masses/lesions in the bladder. Ureteral orifices in normal anatomic location.  Indications: Patient is a 65 year old male with a history of bilateral renal calculi and recurrent urinary tract infections. After discussing treatment options, they decided proceed with bilateral ureteroscopic stone manipulation.  Procedure her in detail: The patient was brought to the operating room and a brief timeout was done to ensure correct patient, correct procedure, correct site.  General anesthesia was administered patient was placed in dorsal lithotomy position.  Her genitalia was then prepped and draped in usual sterile fashion.  A rigid 76 French cystoscope was passed in the urethra and the bladder.  Bladder was inspected free masses or lesions.  the ureteral orifices were in the normal orthotopic locations. a 6 french ureteral catheter was then instilled into the left ureteral orifice.  a gentle retrograde was obtained and findings noted above. We then advanced a zipwire through the catheter and up to the renal pelvis.  we then removed the cystoscope and cannulated the left ureteral orifice with a semirigid ureteroscope.  We located no stone in the ureter. We then placed a sensor wire up to the renal pelvis. We removed the scope and advanced a 12/14 x 38cm access sheath up to the renal pelvis. We then  used the flexible ureteroscope to perform nephroscopy. We located calculi in the mid and lower poles which were fragmented with a 200nm laser fiber and then removed with an NGage basket. Once the stone were removed we then removed the access sheath under direct vision and noted to injury to the ureter.  We then placed a 6 x 26 double-j ureteral stent over the original zip wire. We then removed the wire and good coil was noted in the the renal pelvis under fluoroscopy and the bladder under direct vision.  We then turned out attention to the right side. We then advanced a zipwire through the catheter and up to the renal pelvis.  we then removed the cystoscope and cannulated the left ureteral orifice with a semirigid ureteroscope.  We located no stone in the ureter. We then placed a sensor wire up to the renal pelvis. We removed the scope and advanced a 12/14 x 38cm access sheath up to the renal pelvis. We then used the flexible ureteroscope to perform nephroscopy. We located calculi int he mid and lower poles which were fragmented with a 200nm laser fiber and then were removed with an NGage basket. Once the stone were removed we then removed the access sheath under direct vision and noted to injury to the ureter. we then placed a 6 x 26 double-j ureteral stent over the original zip wire.  We then removed the wire and good coil was noted in the the renal pelvis under fluoroscopy and the bladder under direct vision.   the bladder was then drained and this concluded the procedure which was well tolerated by patient.  Complications: None  Condition: Stable, extubated, transferred to PACU  Plan: Patient is to  be discharged home as to follow-up in 1 weeks for stent removal

## 2016-05-30 DIAGNOSIS — N200 Calculus of kidney: Secondary | ICD-10-CM | POA: Diagnosis not present

## 2016-06-02 ENCOUNTER — Other Ambulatory Visit: Payer: Self-pay | Admitting: Pain Medicine

## 2016-06-05 ENCOUNTER — Ambulatory Visit: Payer: Medicare Other | Admitting: Pain Medicine

## 2016-06-07 ENCOUNTER — Other Ambulatory Visit: Payer: Self-pay | Admitting: Pain Medicine

## 2016-06-09 ENCOUNTER — Other Ambulatory Visit: Payer: Self-pay | Admitting: Pain Medicine

## 2016-06-09 DIAGNOSIS — M47817 Spondylosis without myelopathy or radiculopathy, lumbosacral region: Secondary | ICD-10-CM | POA: Diagnosis not present

## 2016-06-09 DIAGNOSIS — M791 Myalgia: Secondary | ICD-10-CM | POA: Diagnosis not present

## 2016-06-09 DIAGNOSIS — M533 Sacrococcygeal disorders, not elsewhere classified: Secondary | ICD-10-CM | POA: Diagnosis not present

## 2016-06-09 DIAGNOSIS — M5416 Radiculopathy, lumbar region: Secondary | ICD-10-CM | POA: Diagnosis not present

## 2016-07-01 DIAGNOSIS — Z23 Encounter for immunization: Secondary | ICD-10-CM | POA: Diagnosis not present

## 2016-07-09 ENCOUNTER — Other Ambulatory Visit: Payer: Self-pay | Admitting: Family Medicine

## 2016-07-09 DIAGNOSIS — M791 Myalgia: Secondary | ICD-10-CM | POA: Diagnosis not present

## 2016-07-09 DIAGNOSIS — M5416 Radiculopathy, lumbar region: Secondary | ICD-10-CM | POA: Diagnosis not present

## 2016-07-09 DIAGNOSIS — M47817 Spondylosis without myelopathy or radiculopathy, lumbosacral region: Secondary | ICD-10-CM | POA: Diagnosis not present

## 2016-07-09 DIAGNOSIS — G894 Chronic pain syndrome: Secondary | ICD-10-CM

## 2016-07-09 DIAGNOSIS — M533 Sacrococcygeal disorders, not elsewhere classified: Secondary | ICD-10-CM | POA: Diagnosis not present

## 2016-07-17 DIAGNOSIS — N3000 Acute cystitis without hematuria: Secondary | ICD-10-CM | POA: Diagnosis not present

## 2016-07-17 DIAGNOSIS — N200 Calculus of kidney: Secondary | ICD-10-CM | POA: Diagnosis not present

## 2016-07-24 ENCOUNTER — Telehealth: Payer: Self-pay | Admitting: Emergency Medicine

## 2016-07-24 NOTE — Telephone Encounter (Signed)
Dr. Carlota Raspberry,  [facility] called needing provider to co-sign [facility] form for patient to receive DME supplies.

## 2016-07-28 ENCOUNTER — Encounter: Payer: Self-pay | Admitting: Family Medicine

## 2016-07-28 DIAGNOSIS — N200 Calculus of kidney: Secondary | ICD-10-CM | POA: Diagnosis not present

## 2016-07-29 DIAGNOSIS — N200 Calculus of kidney: Secondary | ICD-10-CM | POA: Diagnosis not present

## 2016-07-29 DIAGNOSIS — M791 Myalgia: Secondary | ICD-10-CM | POA: Diagnosis not present

## 2016-07-29 DIAGNOSIS — M533 Sacrococcygeal disorders, not elsewhere classified: Secondary | ICD-10-CM | POA: Diagnosis not present

## 2016-07-29 DIAGNOSIS — M47817 Spondylosis without myelopathy or radiculopathy, lumbosacral region: Secondary | ICD-10-CM | POA: Diagnosis not present

## 2016-07-29 DIAGNOSIS — M5416 Radiculopathy, lumbar region: Secondary | ICD-10-CM | POA: Diagnosis not present

## 2016-08-04 ENCOUNTER — Ambulatory Visit (INDEPENDENT_AMBULATORY_CARE_PROVIDER_SITE_OTHER): Payer: Medicare Other | Admitting: Orthopedic Surgery

## 2016-08-04 DIAGNOSIS — I259 Chronic ischemic heart disease, unspecified: Secondary | ICD-10-CM

## 2016-08-04 DIAGNOSIS — Z89511 Acquired absence of right leg below knee: Secondary | ICD-10-CM | POA: Diagnosis not present

## 2016-08-04 DIAGNOSIS — L97211 Non-pressure chronic ulcer of right calf limited to breakdown of skin: Secondary | ICD-10-CM
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
## 2016-08-18 ENCOUNTER — Ambulatory Visit (INDEPENDENT_AMBULATORY_CARE_PROVIDER_SITE_OTHER): Payer: Medicare Other | Admitting: Orthopedic Surgery

## 2016-08-26 DIAGNOSIS — M533 Sacrococcygeal disorders, not elsewhere classified: Secondary | ICD-10-CM | POA: Diagnosis not present

## 2016-08-26 DIAGNOSIS — M5416 Radiculopathy, lumbar region: Secondary | ICD-10-CM | POA: Diagnosis not present

## 2016-08-26 DIAGNOSIS — M791 Myalgia: Secondary | ICD-10-CM | POA: Diagnosis not present

## 2016-08-26 DIAGNOSIS — M47817 Spondylosis without myelopathy or radiculopathy, lumbosacral region: Secondary | ICD-10-CM | POA: Diagnosis not present

## 2016-09-03 DIAGNOSIS — E782 Mixed hyperlipidemia: Secondary | ICD-10-CM | POA: Diagnosis not present

## 2016-09-03 DIAGNOSIS — E1165 Type 2 diabetes mellitus with hyperglycemia: Secondary | ICD-10-CM | POA: Diagnosis not present

## 2016-09-03 DIAGNOSIS — I10 Essential (primary) hypertension: Secondary | ICD-10-CM | POA: Diagnosis not present

## 2016-09-03 DIAGNOSIS — Z794 Long term (current) use of insulin: Secondary | ICD-10-CM | POA: Diagnosis not present

## 2016-09-23 DIAGNOSIS — E349 Endocrine disorder, unspecified: Secondary | ICD-10-CM | POA: Diagnosis not present

## 2016-09-24 ENCOUNTER — Other Ambulatory Visit: Payer: Self-pay | Admitting: Family Medicine

## 2016-09-24 DIAGNOSIS — F32A Depression, unspecified: Secondary | ICD-10-CM

## 2016-09-24 DIAGNOSIS — F329 Major depressive disorder, single episode, unspecified: Secondary | ICD-10-CM

## 2016-09-24 DIAGNOSIS — R110 Nausea: Secondary | ICD-10-CM

## 2016-09-25 ENCOUNTER — Other Ambulatory Visit (HOSPITAL_COMMUNITY): Payer: Self-pay | Admitting: Surgery

## 2016-09-25 DIAGNOSIS — E349 Endocrine disorder, unspecified: Secondary | ICD-10-CM

## 2016-09-27 ENCOUNTER — Other Ambulatory Visit: Payer: Self-pay | Admitting: Family Medicine

## 2016-09-27 DIAGNOSIS — F32A Depression, unspecified: Secondary | ICD-10-CM

## 2016-09-27 DIAGNOSIS — F329 Major depressive disorder, single episode, unspecified: Secondary | ICD-10-CM

## 2016-09-28 NOTE — Telephone Encounter (Signed)
See prior notes. Has required both meds in past, and seen by GI.  Refilled both.  Should be due for OV prior to these running out.

## 2016-09-29 ENCOUNTER — Other Ambulatory Visit: Payer: Self-pay | Admitting: Pain Medicine

## 2016-09-29 DIAGNOSIS — M47817 Spondylosis without myelopathy or radiculopathy, lumbosacral region: Secondary | ICD-10-CM | POA: Diagnosis not present

## 2016-09-29 DIAGNOSIS — M5416 Radiculopathy, lumbar region: Secondary | ICD-10-CM | POA: Diagnosis not present

## 2016-09-29 DIAGNOSIS — M533 Sacrococcygeal disorders, not elsewhere classified: Secondary | ICD-10-CM | POA: Diagnosis not present

## 2016-09-29 DIAGNOSIS — M791 Myalgia: Secondary | ICD-10-CM | POA: Diagnosis not present

## 2016-09-29 NOTE — Telephone Encounter (Signed)
PT ADVISED TO MAKE APPT. , STATES HE MAY BE SWITCHING

## 2016-10-06 ENCOUNTER — Encounter (HOSPITAL_COMMUNITY)
Admission: RE | Admit: 2016-10-06 | Discharge: 2016-10-06 | Disposition: A | Discharge status: Home / Self Care | Payer: Medicare Other | Source: Ambulatory Visit | Attending: Surgery | Admitting: Surgery

## 2016-10-06 DIAGNOSIS — E349 Endocrine disorder, unspecified: Secondary | ICD-10-CM | POA: Diagnosis not present

## 2016-10-06 DIAGNOSIS — E213 Hyperparathyroidism, unspecified: Secondary | ICD-10-CM | POA: Diagnosis not present

## 2016-10-06 MED ORDER — TECHNETIUM TC 99M SESTAMIBI GENERIC - CARDIOLITE
26.2000 | Freq: Once | INTRAVENOUS | Status: AC | PRN
  Administered 2016-10-06: 26.2 via INTRAVENOUS

## 2016-10-20 DIAGNOSIS — Z794 Long term (current) use of insulin: Secondary | ICD-10-CM | POA: Diagnosis not present

## 2016-10-20 DIAGNOSIS — E669 Obesity, unspecified: Secondary | ICD-10-CM | POA: Diagnosis not present

## 2016-10-20 DIAGNOSIS — I10 Essential (primary) hypertension: Secondary | ICD-10-CM | POA: Diagnosis not present

## 2016-10-20 DIAGNOSIS — E1165 Type 2 diabetes mellitus with hyperglycemia: Secondary | ICD-10-CM | POA: Diagnosis not present

## 2016-10-20 DIAGNOSIS — N200 Calculus of kidney: Secondary | ICD-10-CM | POA: Diagnosis not present

## 2016-10-27 DIAGNOSIS — M533 Sacrococcygeal disorders, not elsewhere classified: Secondary | ICD-10-CM | POA: Diagnosis not present

## 2016-10-27 DIAGNOSIS — M791 Myalgia: Secondary | ICD-10-CM | POA: Diagnosis not present

## 2016-10-27 DIAGNOSIS — M5416 Radiculopathy, lumbar region: Secondary | ICD-10-CM | POA: Diagnosis not present

## 2016-10-27 DIAGNOSIS — M47817 Spondylosis without myelopathy or radiculopathy, lumbosacral region: Secondary | ICD-10-CM | POA: Diagnosis not present

## 2016-10-31 ENCOUNTER — Other Ambulatory Visit: Payer: Self-pay | Admitting: Family Medicine

## 2016-10-31 DIAGNOSIS — G4700 Insomnia, unspecified: Secondary | ICD-10-CM

## 2016-10-31 NOTE — Telephone Encounter (Signed)
04/2016 last ov and refill 

## 2016-11-02 NOTE — Telephone Encounter (Signed)
Refilled, but recommend scheduling follow up appt as last seen in August 2017.

## 2016-11-24 DIAGNOSIS — M791 Myalgia: Secondary | ICD-10-CM | POA: Diagnosis not present

## 2016-11-24 DIAGNOSIS — M5416 Radiculopathy, lumbar region: Secondary | ICD-10-CM | POA: Diagnosis not present

## 2016-11-24 DIAGNOSIS — M47817 Spondylosis without myelopathy or radiculopathy, lumbosacral region: Secondary | ICD-10-CM | POA: Diagnosis not present

## 2016-11-24 DIAGNOSIS — M533 Sacrococcygeal disorders, not elsewhere classified: Secondary | ICD-10-CM | POA: Diagnosis not present

## 2016-11-24 DIAGNOSIS — G894 Chronic pain syndrome: Secondary | ICD-10-CM | POA: Diagnosis not present

## 2016-11-24 DIAGNOSIS — Z79891 Long term (current) use of opiate analgesic: Secondary | ICD-10-CM | POA: Diagnosis not present

## 2016-12-08 ENCOUNTER — Other Ambulatory Visit: Payer: Self-pay | Admitting: Gastroenterology

## 2016-12-08 DIAGNOSIS — R1310 Dysphagia, unspecified: Secondary | ICD-10-CM | POA: Diagnosis not present

## 2016-12-08 DIAGNOSIS — K219 Gastro-esophageal reflux disease without esophagitis: Secondary | ICD-10-CM | POA: Diagnosis not present

## 2016-12-08 DIAGNOSIS — N200 Calculus of kidney: Secondary | ICD-10-CM | POA: Diagnosis not present

## 2016-12-08 DIAGNOSIS — K449 Diaphragmatic hernia without obstruction or gangrene: Secondary | ICD-10-CM | POA: Diagnosis not present

## 2016-12-08 DIAGNOSIS — Z1211 Encounter for screening for malignant neoplasm of colon: Secondary | ICD-10-CM | POA: Diagnosis not present

## 2016-12-17 IMAGING — XA IR NEPHROSTOMY PLACEMENT LEFT
3 series · 7 of 7 positions shown · non-contrast
Comparison: None.

INDICATION: 64-year-old male with history of nephrolithiasis.

EXAM:
IR NEPHROSTOMY PLACEMENT LEFT

[Series 4: care single · 1 of 1 slices shown (1 of 2)]
[im 1/1]
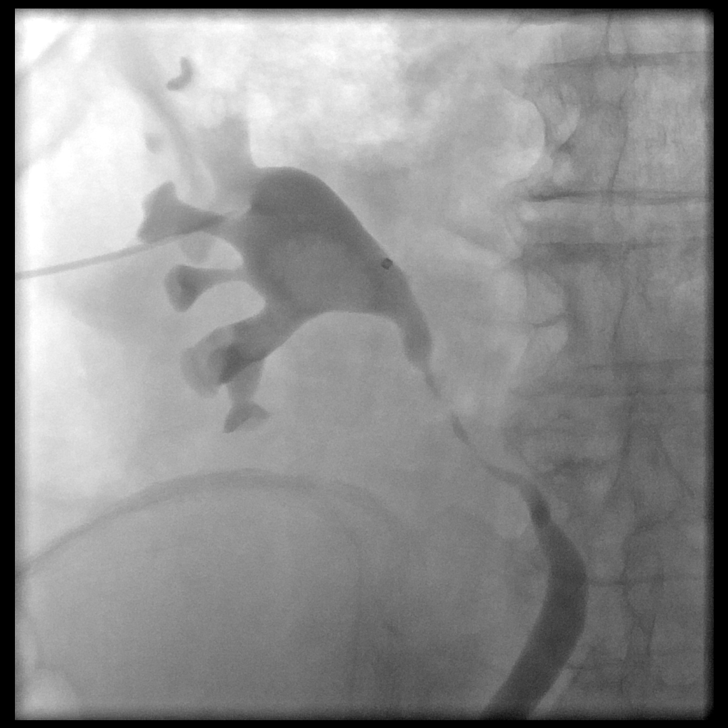

[Series 5: care single · 1 of 1 slices shown (2 of 2)]
[im 1/1]
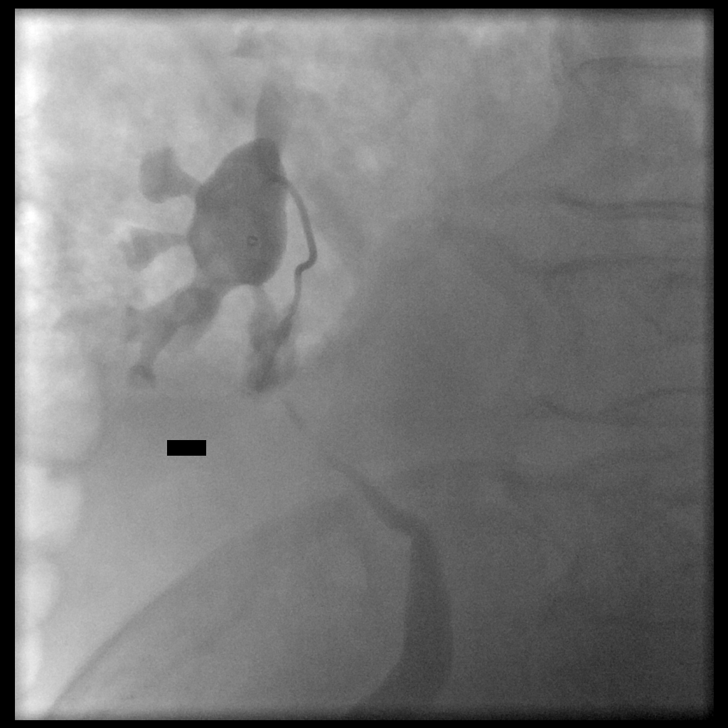

[Series 300: ir nephrostomy placement left · 5 of 5 slices shown]
[im 1/5]
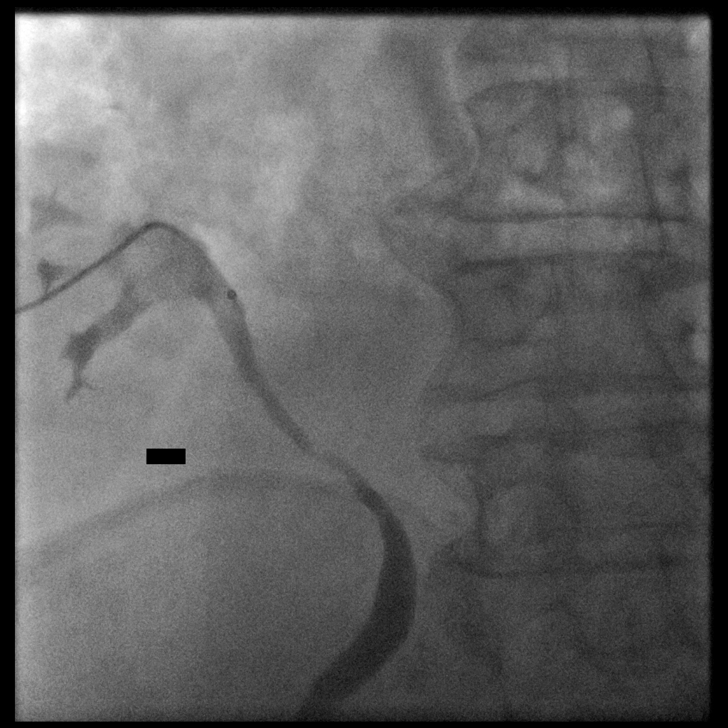
[im 2/5]
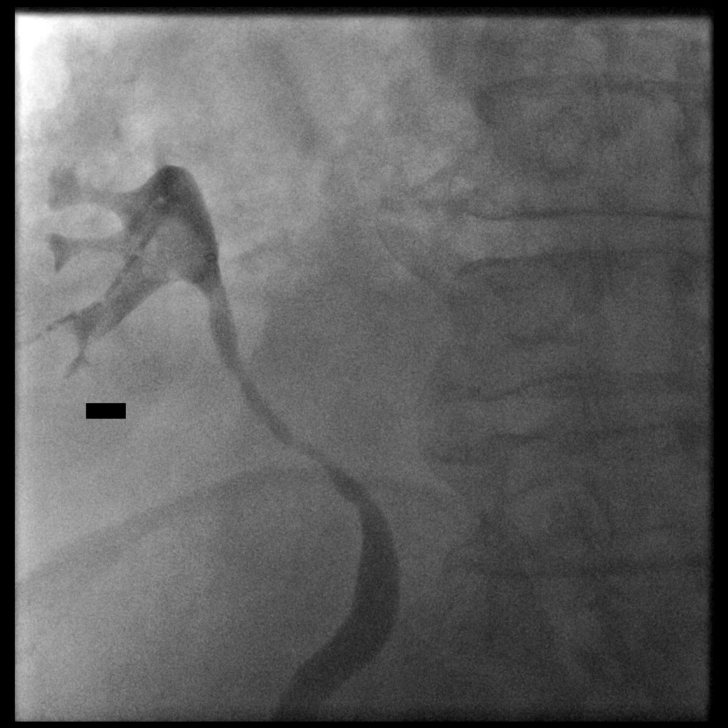
[im 3/5]
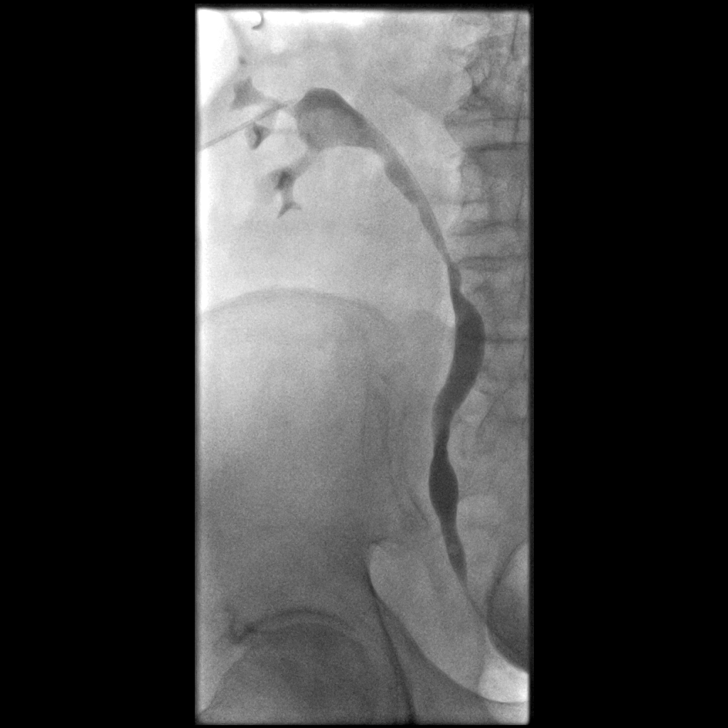
[im 4/5]
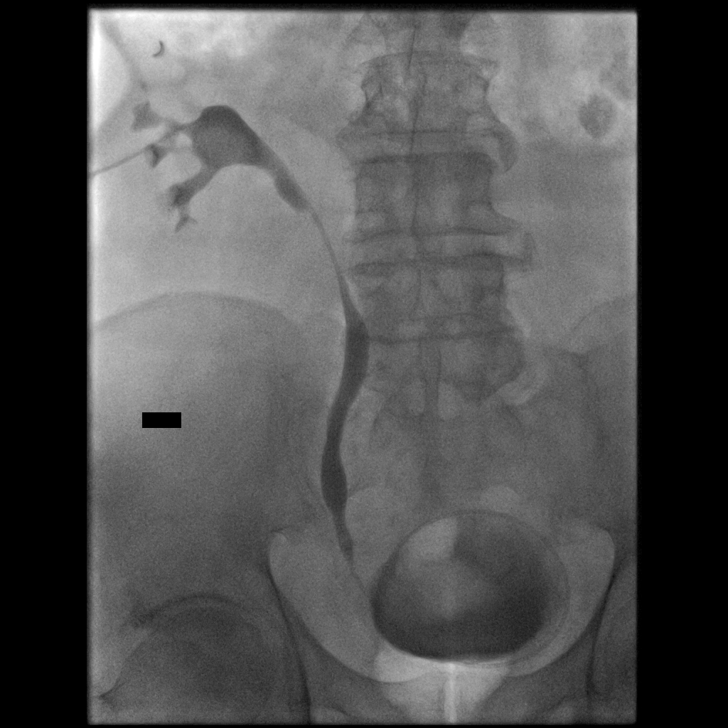
[im 5/5]
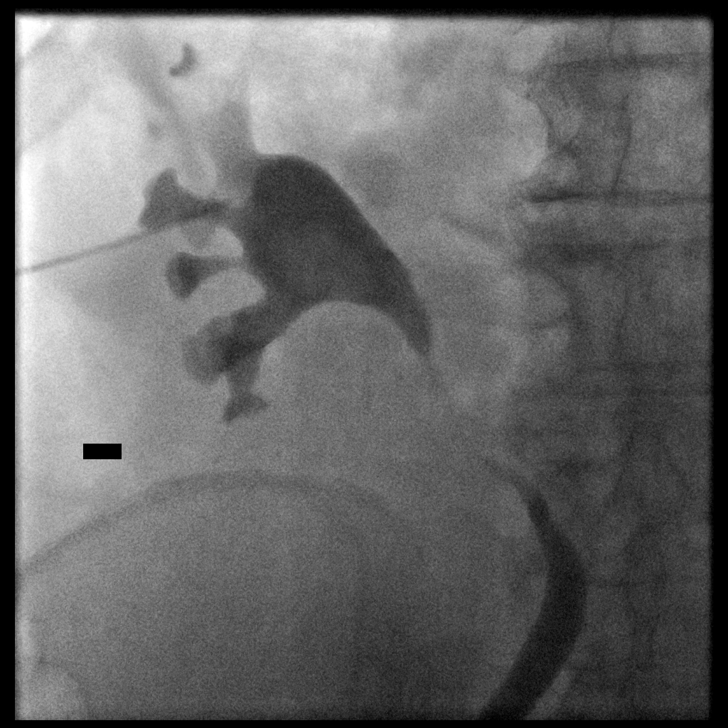

[7 of 7 positions shown; findings below may reference images not displayed]

MEDICATIONS:
400 mg Cipro; The antibiotic was administered in an appropriate time
frame prior to skin puncture.

ANESTHESIA/SEDATION:
Fentanyl 100 mcg IV; Versed 2.5 mg IV

4 mg IV Zofran

Moderate Sedation Time:  26 minutes

The patient was continuously monitored during the procedure by the
interventional radiology nurse under my direct supervision.

CONTRAST:  25mL SL3U3H-ECC IOPAMIDOL (SL3U3H-ECC) INJECTION 61% -
administered into the collecting system(s)

FLUOROSCOPY TIME:  Fluoroscopy Time: 3 minutes 18 seconds (313 mGy).

COMPLICATIONS:
None

PROCEDURE:
Informed written consent was obtained from the patient after a
thorough discussion of the procedural risks, benefits and
alternatives. All questions were addressed. Maximal Sterile Barrier
Technique was utilized including caps, mask, sterile gowns, sterile
gloves, sterile drape, hand hygiene and skin antiseptic. A timeout
was performed prior to the initiation of the procedure.

Patient positioned prone position on the fluoroscopy table.
Ultrasound survey of the left flank was performed with images stored
and sent to PACs.

The patient was then prepped and draped in the usual sterile
fashion. 1% lidocaine was used to anesthetize the skin and
subcutaneous tissues for local anesthesia.

A Chiba needle was then used to access a posterior calyx with
ultrasound and fluoroscopy guidance. With spontaneous urine returned
through the needle, passage of an 018 micro wire into the collecting
system was performed under fluoroscopy.

A small incision was made with an 11 blade scalpel, and the needle
was removed from the wire.

An Accustick system was then advanced over the wire into the
collecting system under fluoroscopy. The metal stiffener and inner
dilator were removed, and contrast infusion confirmed position.

Contrast infusion also confirmed a puncture into a posterior mid
calyx.

A glide wire was passed into the collecting system to the urinary
bladder. A 5 French multipurpose catheter was advanced over the
Glidewire to the urinary bladder.

Final image was stored.

Catheter was sutured in position and capped.

Patient tolerated the procedure well and remained hemodynamically
stable throughout.

No complications were encountered and no significant blood loss
encountered
IMPRESSION: Status post image guided placement of left urinary collecting system
access with placement of a 5 French multipurpose catheter through a
posterior mid calyx to the urinary bladder.

PLAN:
The patient has an operative appointment for percutaneous
nephrolithotomy tomorrow morning.

## 2016-12-19 IMAGING — DX DG CHEST 1V PORT
1 series · 1 of 1 positions shown · non-contrast
Comparison: 12/07/2015

CLINICAL DATA: Sepsis.  Fever and tachycardia.

EXAM:
PORTABLE CHEST 1 VIEW

[chest ap]
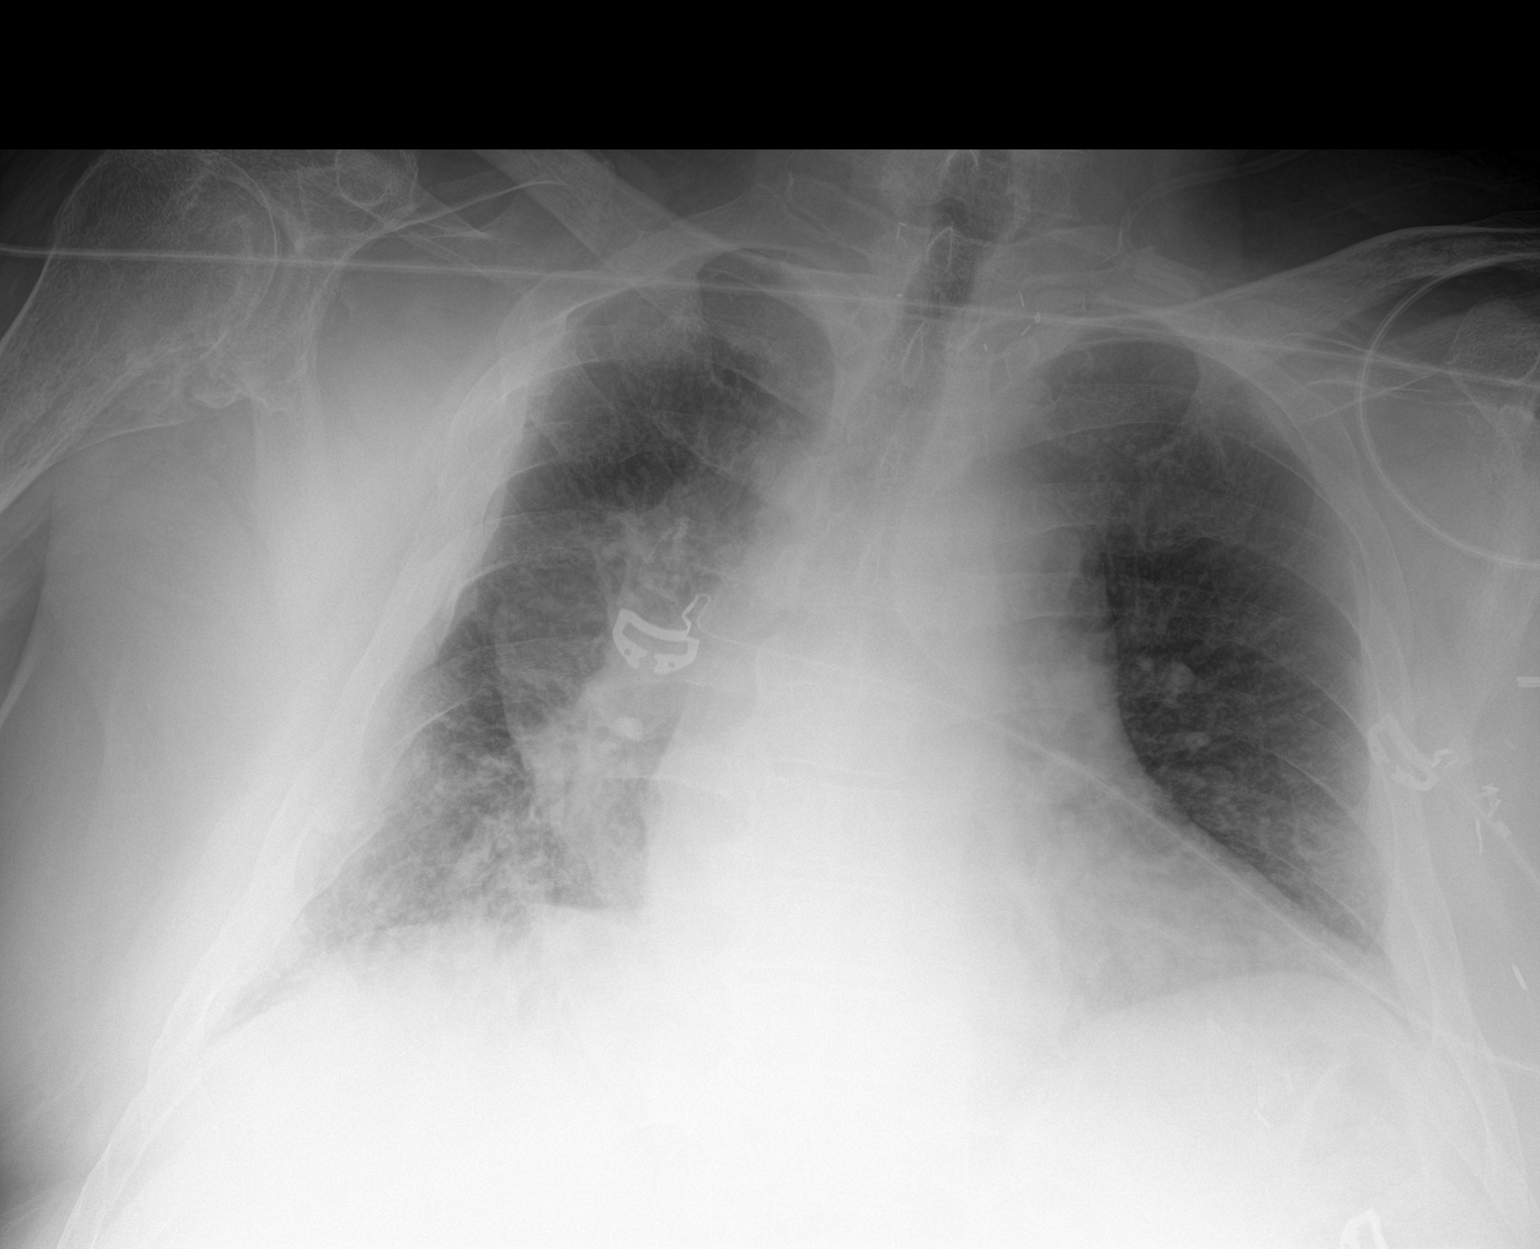

[1 of 1 positions shown; findings below may reference images not displayed]

FINDINGS: Chronic right chest wall deformities and pleural thickening due to
remote rib fractures. Mild chronic interstitial coarsening. No
confluent alveolar opacity. No large effusions. Unchanged
cardiomegaly and aortic tortuosity.
IMPRESSION: No acute cardiopulmonary findings.

## 2016-12-22 DIAGNOSIS — Z79891 Long term (current) use of opiate analgesic: Secondary | ICD-10-CM | POA: Diagnosis not present

## 2016-12-22 DIAGNOSIS — M47816 Spondylosis without myelopathy or radiculopathy, lumbar region: Secondary | ICD-10-CM | POA: Diagnosis not present

## 2016-12-22 DIAGNOSIS — M791 Myalgia: Secondary | ICD-10-CM | POA: Diagnosis not present

## 2016-12-22 DIAGNOSIS — M48061 Spinal stenosis, lumbar region without neurogenic claudication: Secondary | ICD-10-CM | POA: Diagnosis not present

## 2016-12-22 DIAGNOSIS — M5137 Other intervertebral disc degeneration, lumbosacral region: Secondary | ICD-10-CM | POA: Diagnosis not present

## 2016-12-22 DIAGNOSIS — M545 Low back pain: Secondary | ICD-10-CM | POA: Diagnosis not present

## 2016-12-22 DIAGNOSIS — M5416 Radiculopathy, lumbar region: Secondary | ICD-10-CM | POA: Diagnosis not present

## 2016-12-22 DIAGNOSIS — M533 Sacrococcygeal disorders, not elsewhere classified: Secondary | ICD-10-CM | POA: Diagnosis not present

## 2016-12-22 DIAGNOSIS — M47817 Spondylosis without myelopathy or radiculopathy, lumbosacral region: Secondary | ICD-10-CM | POA: Diagnosis not present

## 2016-12-22 DIAGNOSIS — G546 Phantom limb syndrome with pain: Secondary | ICD-10-CM | POA: Diagnosis not present

## 2016-12-22 DIAGNOSIS — M5136 Other intervertebral disc degeneration, lumbar region: Secondary | ICD-10-CM | POA: Diagnosis not present

## 2016-12-31 DIAGNOSIS — E349 Endocrine disorder, unspecified: Secondary | ICD-10-CM | POA: Diagnosis not present

## 2016-12-31 DIAGNOSIS — N200 Calculus of kidney: Secondary | ICD-10-CM | POA: Diagnosis not present

## 2016-12-31 DIAGNOSIS — E559 Vitamin D deficiency, unspecified: Secondary | ICD-10-CM | POA: Diagnosis not present

## 2017-01-01 ENCOUNTER — Other Ambulatory Visit: Payer: Self-pay | Admitting: Family Medicine

## 2017-01-01 DIAGNOSIS — F32A Depression, unspecified: Secondary | ICD-10-CM

## 2017-01-01 DIAGNOSIS — F329 Major depressive disorder, single episode, unspecified: Secondary | ICD-10-CM

## 2017-01-02 ENCOUNTER — Other Ambulatory Visit: Payer: Self-pay | Admitting: Family Medicine

## 2017-01-02 DIAGNOSIS — F32A Depression, unspecified: Secondary | ICD-10-CM

## 2017-01-02 DIAGNOSIS — F329 Major depressive disorder, single episode, unspecified: Secondary | ICD-10-CM

## 2017-01-02 NOTE — Telephone Encounter (Signed)
Refilled 90 day supply, but overdue for follow up. Please schedule appointment in next 1 month.

## 2017-01-05 NOTE — Telephone Encounter (Signed)
MyChart message sent to patient about making an appointment for med refills.

## 2017-01-07 DIAGNOSIS — H18832 Recurrent erosion of cornea, left eye: Secondary | ICD-10-CM | POA: Diagnosis not present

## 2017-01-08 DIAGNOSIS — H18832 Recurrent erosion of cornea, left eye: Secondary | ICD-10-CM | POA: Diagnosis not present

## 2017-01-09 DIAGNOSIS — H18832 Recurrent erosion of cornea, left eye: Secondary | ICD-10-CM | POA: Diagnosis not present

## 2017-01-12 DIAGNOSIS — H18832 Recurrent erosion of cornea, left eye: Secondary | ICD-10-CM | POA: Diagnosis not present

## 2017-01-14 ENCOUNTER — Encounter (HOSPITAL_COMMUNITY): Payer: Self-pay | Admitting: *Deleted

## 2017-01-15 NOTE — Anesthesia Preprocedure Evaluation (Addendum)
Anesthesia Evaluation  Patient identified by MRN, date of birth, ID band Patient awake    Reviewed: Allergy & Precautions, NPO status , Patient's Chart, lab work & pertinent test results  History of Anesthesia Complications (+) history of anesthetic complications (Pt had regurgitation on one GA RSI. Pt was noted to have large amount of stomach contents with his Upper Endoscopy today( 01-16-17) and therefore, I think with his Diabetes he is likely to always be an aspiration risk due to GastroparesisPhoebe Perch MD).)  Airway Mallampati: I  TM Distance: >3 FB Neck ROM: Full    Dental   Pulmonary sleep apnea , former smoker,    Pulmonary exam normal        Cardiovascular hypertension, Pt. on medications + CAD  Normal cardiovascular exam     Neuro/Psych Anxiety Depression    GI/Hepatic GERD  Controlled and Medicated,  Endo/Other  diabetes  Renal/GU      Musculoskeletal   Abdominal   Peds  Hematology   Anesthesia Other Findings Pt had regurgitation on most recent GA  DI not described in prev notes. GA/ RSI good view:  Intubation Type: IV induction, Rapid sequence and Cricoid Pressure applied Ventilation: Unable to mask ventilate(beard?) Laryngoscope Size: Mac and 3 Grade View: Grade I Tube type: Oral Tube size: 7.5 mm Number of attempts: 1 Airway Equipment and Method: Stylet Placement Confirmation: ETT inserted through vocal cords under direct vision,  positive ETCO2 and breath sounds checked- equal and bilateral  Reproductive/Obstetrics                         Anesthesia Physical  Anesthesia Plan  ASA: II  Anesthesia Plan: MAC   Post-op Pain Management:    Induction: Intravenous  Airway Management Planned: Mask and Natural Airway  Additional Equipment:   Intra-op Plan:   Post-operative Plan:   Informed Consent: I have reviewed the patients History and Physical, chart, labs and  discussed the procedure including the risks, benefits and alternatives for the proposed anesthesia with the patient or authorized representative who has indicated his/her understanding and acceptance.     Plan Discussed with: CRNA and Surgeon  Anesthesia Plan Comments: (MAC: Have glidescope available)       Anesthesia Quick Evaluation

## 2017-01-16 ENCOUNTER — Ambulatory Visit (HOSPITAL_COMMUNITY)
Admission: RE | Admit: 2017-01-16 | Discharge: 2017-01-16 | Disposition: A | Discharge status: Home / Self Care | Payer: Medicare Other | Source: Ambulatory Visit | Attending: Gastroenterology | Admitting: Gastroenterology

## 2017-01-16 ENCOUNTER — Ambulatory Visit (HOSPITAL_COMMUNITY): Payer: Medicare Other | Admitting: Anesthesiology

## 2017-01-16 ENCOUNTER — Encounter (HOSPITAL_COMMUNITY)
Admission: RE | Disposition: A | Discharge status: Home / Self Care | Payer: Self-pay | Source: Ambulatory Visit | Attending: Gastroenterology

## 2017-01-16 ENCOUNTER — Encounter (HOSPITAL_COMMUNITY): Payer: Self-pay | Admitting: *Deleted

## 2017-01-16 DIAGNOSIS — Z9049 Acquired absence of other specified parts of digestive tract: Secondary | ICD-10-CM | POA: Insufficient documentation

## 2017-01-16 DIAGNOSIS — Z87891 Personal history of nicotine dependence: Secondary | ICD-10-CM | POA: Insufficient documentation

## 2017-01-16 DIAGNOSIS — Z7982 Long term (current) use of aspirin: Secondary | ICD-10-CM | POA: Diagnosis not present

## 2017-01-16 DIAGNOSIS — K228 Other specified diseases of esophagus: Secondary | ICD-10-CM | POA: Insufficient documentation

## 2017-01-16 DIAGNOSIS — Z79899 Other long term (current) drug therapy: Secondary | ICD-10-CM | POA: Insufficient documentation

## 2017-01-16 DIAGNOSIS — I2510 Atherosclerotic heart disease of native coronary artery without angina pectoris: Secondary | ICD-10-CM | POA: Diagnosis not present

## 2017-01-16 DIAGNOSIS — I10 Essential (primary) hypertension: Secondary | ICD-10-CM | POA: Diagnosis not present

## 2017-01-16 DIAGNOSIS — Z89511 Acquired absence of right leg below knee: Secondary | ICD-10-CM | POA: Insufficient documentation

## 2017-01-16 DIAGNOSIS — F419 Anxiety disorder, unspecified: Secondary | ICD-10-CM | POA: Diagnosis not present

## 2017-01-16 DIAGNOSIS — E119 Type 2 diabetes mellitus without complications: Secondary | ICD-10-CM | POA: Diagnosis not present

## 2017-01-16 DIAGNOSIS — F329 Major depressive disorder, single episode, unspecified: Secondary | ICD-10-CM | POA: Insufficient documentation

## 2017-01-16 DIAGNOSIS — K210 Gastro-esophageal reflux disease with esophagitis: Secondary | ICD-10-CM | POA: Diagnosis not present

## 2017-01-16 DIAGNOSIS — Z794 Long term (current) use of insulin: Secondary | ICD-10-CM | POA: Diagnosis not present

## 2017-01-16 DIAGNOSIS — K208 Other esophagitis: Secondary | ICD-10-CM | POA: Diagnosis not present

## 2017-01-16 DIAGNOSIS — G4730 Sleep apnea, unspecified: Secondary | ICD-10-CM | POA: Diagnosis not present

## 2017-01-16 DIAGNOSIS — R1310 Dysphagia, unspecified: Secondary | ICD-10-CM | POA: Diagnosis not present

## 2017-01-16 DIAGNOSIS — K219 Gastro-esophageal reflux disease without esophagitis: Secondary | ICD-10-CM | POA: Diagnosis not present

## 2017-01-16 DIAGNOSIS — Z79891 Long term (current) use of opiate analgesic: Secondary | ICD-10-CM | POA: Insufficient documentation

## 2017-01-16 HISTORY — PX: ESOPHAGOGASTRODUODENOSCOPY (EGD) WITH PROPOFOL: SHX5813

## 2017-01-16 LAB — GLUCOSE, CAPILLARY: Glucose-Capillary: 322 mg/dL — ABNORMAL HIGH (ref 65–99)

## 2017-01-16 SURGERY — ESOPHAGOGASTRODUODENOSCOPY (EGD) WITH PROPOFOL
Anesthesia: Monitor Anesthesia Care

## 2017-01-16 MED ORDER — PROPOFOL 10 MG/ML IV BOLUS
INTRAVENOUS | Status: DC | PRN
  Administered 2017-01-16: 10 mg via INTRAVENOUS
  Administered 2017-01-16: 30 mg via INTRAVENOUS

## 2017-01-16 MED ORDER — SODIUM CHLORIDE 0.9 % IV SOLN: INTRAVENOUS | Status: DC

## 2017-01-16 MED ORDER — PROPOFOL 10 MG/ML IV BOLUS
INTRAVENOUS | Status: AC
  Filled 2017-01-16: qty 40

## 2017-01-16 MED ORDER — LIDOCAINE 2% (20 MG/ML) 5 ML SYRINGE
INTRAMUSCULAR | Status: DC | PRN
  Administered 2017-01-16: 100 mg via INTRAVENOUS

## 2017-01-16 MED ORDER — LIDOCAINE 2% (20 MG/ML) 5 ML SYRINGE
INTRAMUSCULAR | Status: AC
Start: 2017-01-16 — End: 2017-01-16
  Filled 2017-01-16: qty 5

## 2017-01-16 MED ORDER — ONDANSETRON HCL 4 MG/2ML IJ SOLN
INTRAMUSCULAR | Status: DC | PRN
  Administered 2017-01-16: 4 mg via INTRAVENOUS

## 2017-01-16 MED ORDER — PROPOFOL 500 MG/50ML IV EMUL
INTRAVENOUS | Status: DC | PRN
  Administered 2017-01-16: 200 ug/kg/min via INTRAVENOUS

## 2017-01-16 MED ORDER — LACTATED RINGERS IV SOLN
INTRAVENOUS | Status: DC
  Administered 2017-01-16: 1000 mL via INTRAVENOUS

## 2017-01-16 MED ORDER — ONDANSETRON HCL 4 MG/2ML IJ SOLN
INTRAMUSCULAR | Status: AC
  Filled 2017-01-16: qty 2

## 2017-01-16 SURGICAL SUPPLY — 25 items

## 2017-01-16 NOTE — H&P (Signed)
[place] HPI: For the past 3 months he reports a worsening of his dysphagia. In 2013 he was identified to have an LA Grade D esophagitis at the anatamosis, which was at 30 cm. He is s/p partial esophagectomy secondary to a perforation with the NG tube at [facility]. The patient continues with Dexilant, but he has dysphagia to both solids and liquids. In spite of his intermittent vomiting and curtailed PO intake, he does not have any weight loss. Also, his last colonoscopy was 12 years ago and it was normal. He denies any issues with hematochezia, melena, diarrhea, or constipation. His paternal grandfather had colon in his 36's and he died from the cancer.     Past Surgical History:  Procedure Laterality Date  . APPENDECTOMY  child  . BELOW KNEE LEG AMPUTATION Right 06/22/2009   CHRONIC OSTEOMYELITIS  . CARDIOVASCULAR STRESS TEST  12-04-2015  dr cooper   normal lexiscan without exercise nuclear study w/ no ischemia/  normal LVF and wall motion,  ef 64%  . CARPAL TUNNEL RELEASE Right 2007  . CATARACT EXTRACTION W/ INTRAOCULAR LENS  IMPLANT, BILATERAL  1990's  . CYSTOSCOPY WITH STENT PLACEMENT Bilateral 05/22/2016   Procedure: CYSTOSCOPY WITH STENT PLACEMENT;  Surgeon: Cleon Gustin, MD;  Location: [facility];  Service: Urology;  Laterality: Bilateral;  . CYSTOSCOPY/RETROGRADE/URETEROSCOPY/STONE EXTRACTION WITH BASKET Bilateral 05/22/2016   Procedure: CYSTOSCOPY/RETROGRADE/URETEROSCOPY/STONE EXTRACTION WITH BASKET;  Surgeon: Cleon Gustin, MD;  Location: [facility];  Service: Urology;  Laterality: Bilateral;  . ESOPHAGOGASTRECTOMY  03/26/2010   [facility]   IVOR LEWIS via Thoracotomy  for recurrent lung esophageal stricture  . ESOPHAGOGASTRODUODENOSCOPY (EGD) WITH ESOPHAGEAL DILATION  multiple  . HOLMIUM LASER APPLICATION Bilateral 04/15/174   Procedure: HOLMIUM LASER APPLICATION;  Surgeon: Cleon Gustin, MD;  Location: [facility];   Service: Urology;  Laterality: Bilateral;  . KNEE ARTHROSCOPY Right 2007  . LUMBAR DISC SURGERY  x2  last one 62  . MULTIPLE RIGHT LOWER EXTREMITIY SURGERY'S  >20  in 2009   [facility]  . NEPHROLITHOTOMY Left 12/20/2015   Procedure: NEPHROLITHOTOMY PERCUTANEOUS;  Surgeon: Cleon Gustin, MD;  Location: [facility];  Service: Urology;  Laterality: Left;  . NEPHROLITHOTOMY Right 12/24/2015   Procedure: RIGHT  PERCUTANEOUS NEPHROLITHOTOMY right double j stent;  Surgeon: Cleon Gustin, MD;  Location: [facility];  Service: Urology;  Laterality: Right;  with Holmium  Laser  . ORIF LEFT WRIST FX  2009  . ORIF RIGHT COMPLEX ANKLE FX  04-2008  [facility]  . PARATHYROIDECTOMY  1980's   benign tumor  . PLACEMENT ESOPHAGEAL STENT   2009   [facility]   perfuration from g-tube placement  . STUMP REVISION Right 08/21/2009  . TRACHEOSTOMY  2009  . TRANSTHORACIC ECHOCARDIOGRAM  12/05/2015   mild LVH,  ef 65-70%  . TRIGGER FINGER RELEASE Right 05/14/2006   ring    Family History  Problem Relation Age of Onset  . Hypertension Mother   . Heart disease Mother   . Diabetes Mother   . Obesity Mother   . COPD Father   . Cancer Neg Hx     Social History:  reports that he quit smoking about 38 years ago. His smoking use included Cigarettes. He has a 12.00 pack-year smoking history. He has never used smokeless tobacco. He reports that he does not drink alcohol or use drugs.  Allergies:  Allergies  Allergen Reactions  . Bactrim [Sulfamethoxazole-Trimethoprim] Other (See Comments)  Blisters in his mouth  . Symbicort [Budesonide-Formoterol Fumarate] Other (See Comments)    Hyperglycemia   . Advair Diskus [Fluticasone-Salmeterol] Other (See Comments)    hyperglycemia  . Hydromorphone Hives, Itching and Nausea And Vomiting  . Morphine Itching  . Soap Itching and Other (See Comments)    Other Reaction: ivory soap=itching     Medications:  Scheduled:  Continuous: . sodium chloride    . lactated ringers 1,000 mL  (01/16/17 0733)    Results for orders placed or performed during the hospital encounter of 01/16/17 (from the past 24 hour(s))  Glucose, capillary     Status: Abnormal   Collection Time: 01/16/17  7:08 AM  Result Value Ref Range   Glucose-Capillary 322 (H) 65 - 99 mg/dL     No results found.  ROS:  As stated above in the HPI otherwise negative.  Blood pressure (!) 153/62, pulse 83, temperature 99 F (37.2 C), temperature source Oral, resp. rate 12, height 6\' 1"  (1.854 m), weight 124.7 kg (275 lb), SpO2 96 %.    PE: Gen: NAD, Alert and Oriented HEENT:  [place]/AT, EOMI Neck: Supple, no LAD Lungs: CTA Bilaterally CV: RRR without M/G/R ABM: Soft, NTND, +BS Ext: right below the knee amputation  Assessment/Plan: 1) Dysphagia - he has a history of esophagitis. I will repeat the EGD and assess the situation.  If possible, dilation will be performed. 2) CRC - He did not have any response with the prep.  He will be rescheduled for the procedure.  Jamani Bearce D 01/16/2017, 8:08 AM

## 2017-01-16 NOTE — Discharge Instructions (Signed)

## 2017-01-16 NOTE — Transfer of Care (Signed)
Immediate Anesthesia Transfer of Care Note  Patient: [name]  Procedure(s) Performed: Procedure(s): ESOPHAGOGASTRODUODENOSCOPY (EGD) WITH PROPOFOL (N/A)  Patient Location: PACU  Anesthesia Type:MAC  Level of Consciousness:  sedated, patient cooperative and responds to stimulation  Airway & Oxygen Therapy:Patient Spontanous Breathing and Patient connected to face mask oxgen  Post-op Assessment:  Report given to PACU RN and Post -op Vital signs reviewed and stable  Post vital signs:  Reviewed and stable  Last Vitals:  Vitals:   01/16/17 0701 01/16/17 0832  BP: (!) 153/62 (!) 145/72  Pulse: 83 75  Resp: 12 16  Temp: 37.2 C 85.8 C    Complications: No apparent anesthesia complications

## 2017-01-16 NOTE — Anesthesia Postprocedure Evaluation (Signed)
Anesthesia Post Note  Patient: [name]  Procedure(s) Performed: Procedure(s) (LRB): ESOPHAGOGASTRODUODENOSCOPY (EGD) WITH PROPOFOL (N/A)  Patient location during evaluation: PACU Anesthesia Type: MAC Level of consciousness: awake and alert Pain management: pain level controlled Vital Signs Assessment: post-procedure vital signs reviewed and stable Respiratory status: spontaneous breathing, nonlabored ventilation, respiratory function stable and patient connected to nasal cannula oxygen Cardiovascular status: stable and blood pressure returned to baseline Anesthetic complications: no       Last Vitals:  Vitals:   01/16/17 0850 01/16/17 0855  BP: (!) 163/68 (!) 165/63  Pulse: 85 69  Resp: 15 (!) 8  Temp:      Last Pain:  Vitals:   01/16/17 0832  TempSrc: Oral  PainSc:                  Riccardo Dubin

## 2017-01-16 NOTE — Op Note (Signed)
[facility] Patient Name: [name] Procedure Date: 01/16/2017 MRN: [id number] Attending MD: Carol Ada , MD Date of Birth: Dec 10, 1950 CSN: [id number] Age: 66 Admit Type: Outpatient Procedure:                Upper GI endoscopy Indications:              Dysphagia Providers:                Carol Ada, MD, Vista Lawman, RN, Cherylynn Ridges,                            Technician, Alan Mulder, Technician, Virgia Land, CRNA Referring MD:              Medicines:                Propofol per Anesthesia Complications:            No immediate complications. Estimated Blood Loss:     Estimated blood loss was minimal. Procedure:                Pre-Anesthesia Assessment:                           - Prior to the procedure, a History and Physical                            was performed, and patient medications and                            allergies were reviewed. The patient's tolerance of                            previous anesthesia was also reviewed. The risks                            and benefits of the procedure and the sedation                            options and risks were discussed with the patient.                            All questions were answered, and informed consent                            was obtained. Prior Anticoagulants: The patient has                            taken no previous anticoagulant or antiplatelet                            agents. ASA Grade Assessment: III - A patient with                            severe systemic  disease. After reviewing the risks                            and benefits, the patient was deemed in                            satisfactory condition to undergo the procedure.                           - Sedation was administered by an anesthesia                            professional. Deep sedation was attained.                           After obtaining informed consent, the endoscope was                             passed under direct vision. Throughout the                            procedure, the patient's blood pressure, pulse, and                            oxygen saturations were monitored continuously. The                            Endoscope was introduced through the mouth, and                            advanced to the third part of duodenum. The upper                            GI endoscopy was accomplished without difficulty.                            The patient tolerated the procedure well. Scope In: Scope Out: Findings:      LA Grade D (one or more mucosal breaks involving at least 75% of       esophageal circumference) esophagitis with no bleeding was found. The       anastamosis was found at 27 cm and the area was friable. There was       signficant free reflux of gastric contents into the esophagus. In fact,       aggressive suctioning of the liquid gastric contents was required to       prevent aspiration.      An esophago-gastric anastomosis was found in the upper third of the       esophagus.      The stomach was normal.      The examined duodenum was normal. Impression:               - LA Grade D reflux esophagitis.                           - An esophago-gastric anastomosis was found.                           -  Normal stomach.                           - Normal examined duodenum.                           - No specimens collected. Moderate Sedation:      N/A- Per Anesthesia Care Recommendation:           - Patient has a contact number available for                            emergencies. The signs and symptoms of potential                            delayed complications were discussed with the                            patient. Return to normal activities tomorrow.                            Written discharge instructions were provided to the                            patient.                           - Resume previous diet.                            - Continue present medications.                           - PPI BID.                           - Follow up in the office in one month and                            reschedule for the colonoscopy. Procedure Code(s):        --- Professional ---                           [phone], Esophagogastroduodenoscopy, flexible,                            transoral; diagnostic, including collection of                            specimen(s) by brushing or washing, when performed                            (separate procedure) Diagnosis Code(s):        --- Professional ---                           K21.0, Gastro-esophageal reflux disease with  esophagitis                           Z98.890, Other specified postprocedural states                           R13.10, Dysphagia, unspecified CPT copyright 2016 [employer]. All rights reserved. The codes documented in this report are preliminary and upon coder review may  be revised to meet current compliance requirements. Carol Ada, MD Carol Ada, MD 01/16/2017 8:33:34 AM This report has been signed electronically. Number of Addenda: 0

## 2017-01-16 NOTE — Progress Notes (Signed)
CBG on arrival is 322. Pt stated at home this am his blood glucose was 340 and took half his normal dose of insulin. Dr. Glennon Mac aware of CBG of 322 currently. No new orders at this time.  Will continue to assess.

## 2017-01-19 ENCOUNTER — Encounter (HOSPITAL_COMMUNITY): Payer: Self-pay | Admitting: Gastroenterology

## 2017-01-19 DIAGNOSIS — H18832 Recurrent erosion of cornea, left eye: Secondary | ICD-10-CM | POA: Diagnosis not present

## 2017-01-20 DIAGNOSIS — M791 Myalgia: Secondary | ICD-10-CM | POA: Diagnosis not present

## 2017-01-20 DIAGNOSIS — M47817 Spondylosis without myelopathy or radiculopathy, lumbosacral region: Secondary | ICD-10-CM | POA: Diagnosis not present

## 2017-01-20 DIAGNOSIS — M533 Sacrococcygeal disorders, not elsewhere classified: Secondary | ICD-10-CM | POA: Diagnosis not present

## 2017-01-20 DIAGNOSIS — M5416 Radiculopathy, lumbar region: Secondary | ICD-10-CM | POA: Diagnosis not present

## 2017-02-03 ENCOUNTER — Other Ambulatory Visit: Payer: Self-pay | Admitting: Family Medicine

## 2017-02-03 DIAGNOSIS — F32A Depression, unspecified: Secondary | ICD-10-CM

## 2017-02-03 DIAGNOSIS — F329 Major depressive disorder, single episode, unspecified: Secondary | ICD-10-CM

## 2017-02-05 DIAGNOSIS — L989 Disorder of the skin and subcutaneous tissue, unspecified: Secondary | ICD-10-CM | POA: Diagnosis not present

## 2017-02-05 DIAGNOSIS — E1165 Type 2 diabetes mellitus with hyperglycemia: Secondary | ICD-10-CM | POA: Diagnosis not present

## 2017-02-05 DIAGNOSIS — G546 Phantom limb syndrome with pain: Secondary | ICD-10-CM | POA: Diagnosis not present

## 2017-02-05 DIAGNOSIS — I10 Essential (primary) hypertension: Secondary | ICD-10-CM | POA: Diagnosis not present

## 2017-02-05 DIAGNOSIS — Z794 Long term (current) use of insulin: Secondary | ICD-10-CM | POA: Diagnosis not present

## 2017-02-05 DIAGNOSIS — F419 Anxiety disorder, unspecified: Secondary | ICD-10-CM | POA: Diagnosis not present

## 2017-02-05 DIAGNOSIS — E669 Obesity, unspecified: Secondary | ICD-10-CM | POA: Diagnosis not present

## 2017-02-05 DIAGNOSIS — G8929 Other chronic pain: Secondary | ICD-10-CM | POA: Diagnosis not present

## 2017-02-10 DIAGNOSIS — E349 Endocrine disorder, unspecified: Secondary | ICD-10-CM | POA: Diagnosis not present

## 2017-02-10 DIAGNOSIS — E559 Vitamin D deficiency, unspecified: Secondary | ICD-10-CM | POA: Diagnosis not present

## 2017-02-17 DIAGNOSIS — M48061 Spinal stenosis, lumbar region without neurogenic claudication: Secondary | ICD-10-CM | POA: Diagnosis not present

## 2017-02-17 DIAGNOSIS — M5416 Radiculopathy, lumbar region: Secondary | ICD-10-CM | POA: Diagnosis not present

## 2017-02-17 DIAGNOSIS — M791 Myalgia: Secondary | ICD-10-CM | POA: Diagnosis not present

## 2017-02-17 DIAGNOSIS — M47817 Spondylosis without myelopathy or radiculopathy, lumbosacral region: Secondary | ICD-10-CM | POA: Diagnosis not present

## 2017-02-17 DIAGNOSIS — Z79891 Long term (current) use of opiate analgesic: Secondary | ICD-10-CM | POA: Diagnosis not present

## 2017-02-17 DIAGNOSIS — M5136 Other intervertebral disc degeneration, lumbar region: Secondary | ICD-10-CM | POA: Diagnosis not present

## 2017-02-17 DIAGNOSIS — G546 Phantom limb syndrome with pain: Secondary | ICD-10-CM | POA: Diagnosis not present

## 2017-02-17 DIAGNOSIS — M47816 Spondylosis without myelopathy or radiculopathy, lumbar region: Secondary | ICD-10-CM | POA: Diagnosis not present

## 2017-02-17 DIAGNOSIS — M545 Low back pain: Secondary | ICD-10-CM | POA: Diagnosis not present

## 2017-02-17 DIAGNOSIS — M5137 Other intervertebral disc degeneration, lumbosacral region: Secondary | ICD-10-CM | POA: Diagnosis not present

## 2017-02-17 DIAGNOSIS — M533 Sacrococcygeal disorders, not elsewhere classified: Secondary | ICD-10-CM | POA: Diagnosis not present

## 2017-02-18 DIAGNOSIS — K208 Other esophagitis: Secondary | ICD-10-CM | POA: Diagnosis not present

## 2017-02-18 DIAGNOSIS — K219 Gastro-esophageal reflux disease without esophagitis: Secondary | ICD-10-CM | POA: Diagnosis not present

## 2017-02-18 DIAGNOSIS — R1310 Dysphagia, unspecified: Secondary | ICD-10-CM | POA: Diagnosis not present

## 2017-03-03 ENCOUNTER — Other Ambulatory Visit: Payer: Self-pay | Admitting: Family Medicine

## 2017-03-03 DIAGNOSIS — D485 Neoplasm of uncertain behavior of skin: Secondary | ICD-10-CM | POA: Diagnosis not present

## 2017-03-03 DIAGNOSIS — C4441 Basal cell carcinoma of skin of scalp and neck: Secondary | ICD-10-CM | POA: Diagnosis not present

## 2017-03-03 DIAGNOSIS — F329 Major depressive disorder, single episode, unspecified: Secondary | ICD-10-CM

## 2017-03-03 DIAGNOSIS — F32A Depression, unspecified: Secondary | ICD-10-CM

## 2017-03-03 NOTE — Telephone Encounter (Signed)
Lm for patient to call with his follow up plan, before we can refill

## 2017-03-05 ENCOUNTER — Other Ambulatory Visit: Payer: Self-pay

## 2017-03-13 ENCOUNTER — Other Ambulatory Visit: Payer: Self-pay | Admitting: Cardiovascular Disease

## 2017-03-13 DIAGNOSIS — Z5181 Encounter for therapeutic drug level monitoring: Secondary | ICD-10-CM | POA: Diagnosis not present

## 2017-03-13 DIAGNOSIS — M545 Low back pain: Secondary | ICD-10-CM | POA: Diagnosis not present

## 2017-03-13 DIAGNOSIS — G894 Chronic pain syndrome: Secondary | ICD-10-CM | POA: Diagnosis not present

## 2017-03-13 DIAGNOSIS — G8929 Other chronic pain: Secondary | ICD-10-CM | POA: Diagnosis not present

## 2017-03-20 DIAGNOSIS — T1501XA Foreign body in cornea, right eye, initial encounter: Secondary | ICD-10-CM | POA: Diagnosis not present

## 2017-03-23 DIAGNOSIS — T1501XA Foreign body in cornea, right eye, initial encounter: Secondary | ICD-10-CM | POA: Diagnosis not present

## 2017-03-24 DIAGNOSIS — T1501XA Foreign body in cornea, right eye, initial encounter: Secondary | ICD-10-CM | POA: Diagnosis not present

## 2017-03-25 ENCOUNTER — Other Ambulatory Visit: Payer: Self-pay | Admitting: Gastroenterology

## 2017-03-25 DIAGNOSIS — K219 Gastro-esophageal reflux disease without esophagitis: Secondary | ICD-10-CM | POA: Diagnosis not present

## 2017-03-25 DIAGNOSIS — K208 Other esophagitis: Secondary | ICD-10-CM | POA: Diagnosis not present

## 2017-03-25 DIAGNOSIS — Z1211 Encounter for screening for malignant neoplasm of colon: Secondary | ICD-10-CM | POA: Diagnosis not present

## 2017-03-25 DIAGNOSIS — R1310 Dysphagia, unspecified: Secondary | ICD-10-CM | POA: Diagnosis not present

## 2017-03-27 DIAGNOSIS — H18832 Recurrent erosion of cornea, left eye: Secondary | ICD-10-CM | POA: Diagnosis not present

## 2017-03-27 DIAGNOSIS — T1501XA Foreign body in cornea, right eye, initial encounter: Secondary | ICD-10-CM | POA: Diagnosis not present

## 2017-03-27 NOTE — Addendum Note (Signed)
Addendum  created 03/27/17 1214 by Lyndle Herrlich, MD   Sign clinical note

## 2017-03-27 NOTE — Anesthesia Postprocedure Evaluation (Signed)
Anesthesia Post Note  Patient: [name] [facility]  Procedure(s) Performed: Procedure(s) (LRB): ESOPHAGOGASTRODUODENOSCOPY (EGD) WITH PROPOFOL (N/A)     Anesthesia Post Evaluation  Last Vitals:  Vitals:   01/16/17 0850 01/16/17 0855  BP: (!) 163/68 (!) 165/63  Pulse: 85 69  Resp: 15 (!) 8  Temp:      Last Pain:  Vitals:   01/16/17 0832  TempSrc: Oral  PainSc:                  Riccardo Dubin

## 2017-03-30 DIAGNOSIS — G894 Chronic pain syndrome: Secondary | ICD-10-CM | POA: Diagnosis not present

## 2017-03-30 DIAGNOSIS — R2689 Other abnormalities of gait and mobility: Secondary | ICD-10-CM | POA: Diagnosis not present

## 2017-03-30 DIAGNOSIS — M545 Low back pain: Secondary | ICD-10-CM | POA: Diagnosis not present

## 2017-04-01 DIAGNOSIS — H2513 Age-related nuclear cataract, bilateral: Secondary | ICD-10-CM | POA: Diagnosis not present

## 2017-04-01 DIAGNOSIS — H16223 Keratoconjunctivitis sicca, not specified as Sjogren's, bilateral: Secondary | ICD-10-CM | POA: Diagnosis not present

## 2017-04-01 DIAGNOSIS — H01021 Squamous blepharitis right upper eyelid: Secondary | ICD-10-CM | POA: Diagnosis not present

## 2017-04-01 DIAGNOSIS — H18833 Recurrent erosion of cornea, bilateral: Secondary | ICD-10-CM | POA: Diagnosis not present

## 2017-04-01 DIAGNOSIS — H01025 Squamous blepharitis left lower eyelid: Secondary | ICD-10-CM | POA: Diagnosis not present

## 2017-04-01 DIAGNOSIS — H01024 Squamous blepharitis left upper eyelid: Secondary | ICD-10-CM | POA: Diagnosis not present

## 2017-04-01 DIAGNOSIS — H01022 Squamous blepharitis right lower eyelid: Secondary | ICD-10-CM | POA: Diagnosis not present

## 2017-04-04 ENCOUNTER — Other Ambulatory Visit: Payer: Self-pay | Admitting: Family Medicine

## 2017-04-04 DIAGNOSIS — F329 Major depressive disorder, single episode, unspecified: Secondary | ICD-10-CM

## 2017-04-04 DIAGNOSIS — F32A Depression, unspecified: Secondary | ICD-10-CM

## 2017-04-06 DIAGNOSIS — H18833 Recurrent erosion of cornea, bilateral: Secondary | ICD-10-CM | POA: Diagnosis not present

## 2017-04-06 DIAGNOSIS — H01024 Squamous blepharitis left upper eyelid: Secondary | ICD-10-CM | POA: Diagnosis not present

## 2017-04-06 DIAGNOSIS — H01025 Squamous blepharitis left lower eyelid: Secondary | ICD-10-CM | POA: Diagnosis not present

## 2017-04-06 DIAGNOSIS — H01021 Squamous blepharitis right upper eyelid: Secondary | ICD-10-CM | POA: Diagnosis not present

## 2017-04-06 DIAGNOSIS — Z961 Presence of intraocular lens: Secondary | ICD-10-CM | POA: Diagnosis not present

## 2017-04-06 DIAGNOSIS — H17821 Peripheral opacity of cornea, right eye: Secondary | ICD-10-CM | POA: Diagnosis not present

## 2017-04-06 DIAGNOSIS — H01022 Squamous blepharitis right lower eyelid: Secondary | ICD-10-CM | POA: Diagnosis not present

## 2017-04-07 DIAGNOSIS — G8929 Other chronic pain: Secondary | ICD-10-CM | POA: Diagnosis not present

## 2017-04-07 DIAGNOSIS — M545 Low back pain: Secondary | ICD-10-CM | POA: Diagnosis not present

## 2017-04-07 DIAGNOSIS — G894 Chronic pain syndrome: Secondary | ICD-10-CM | POA: Diagnosis not present

## 2017-04-09 DIAGNOSIS — N200 Calculus of kidney: Secondary | ICD-10-CM | POA: Diagnosis not present

## 2017-04-09 DIAGNOSIS — M545 Low back pain: Secondary | ICD-10-CM | POA: Diagnosis not present

## 2017-04-09 DIAGNOSIS — R2689 Other abnormalities of gait and mobility: Secondary | ICD-10-CM | POA: Diagnosis not present

## 2017-04-09 DIAGNOSIS — G894 Chronic pain syndrome: Secondary | ICD-10-CM | POA: Diagnosis not present

## 2017-04-11 ENCOUNTER — Other Ambulatory Visit: Payer: Self-pay | Admitting: Cardiovascular Disease

## 2017-04-13 DIAGNOSIS — C4441 Basal cell carcinoma of skin of scalp and neck: Secondary | ICD-10-CM | POA: Diagnosis not present

## 2017-04-28 ENCOUNTER — Encounter (HOSPITAL_COMMUNITY): Payer: Self-pay | Admitting: *Deleted

## 2017-05-01 ENCOUNTER — Ambulatory Visit (HOSPITAL_COMMUNITY): Admission: RE | Admit: 2017-05-01 | Payer: Medicare Other | Source: Ambulatory Visit | Admitting: Gastroenterology

## 2017-05-01 HISTORY — DX: Malignant (primary) neoplasm, unspecified: C80.1

## 2017-05-01 SURGERY — COLONOSCOPY WITH PROPOFOL
Anesthesia: Monitor Anesthesia Care

## 2017-05-04 ENCOUNTER — Other Ambulatory Visit: Payer: Self-pay | Admitting: Family Medicine

## 2017-05-07 DIAGNOSIS — N200 Calculus of kidney: Secondary | ICD-10-CM | POA: Diagnosis not present

## 2017-05-12 ENCOUNTER — Other Ambulatory Visit: Payer: Self-pay | Admitting: Family Medicine

## 2017-05-12 DIAGNOSIS — I10 Essential (primary) hypertension: Secondary | ICD-10-CM

## 2017-05-14 ENCOUNTER — Other Ambulatory Visit: Payer: Self-pay | Admitting: Urology

## 2017-05-15 DIAGNOSIS — Z794 Long term (current) use of insulin: Secondary | ICD-10-CM | POA: Diagnosis not present

## 2017-05-15 DIAGNOSIS — I2510 Atherosclerotic heart disease of native coronary artery without angina pectoris: Secondary | ICD-10-CM | POA: Diagnosis not present

## 2017-05-15 DIAGNOSIS — T148XXA Other injury of unspecified body region, initial encounter: Secondary | ICD-10-CM | POA: Diagnosis not present

## 2017-05-15 DIAGNOSIS — D649 Anemia, unspecified: Secondary | ICD-10-CM | POA: Diagnosis not present

## 2017-05-15 DIAGNOSIS — E1165 Type 2 diabetes mellitus with hyperglycemia: Secondary | ICD-10-CM | POA: Diagnosis not present

## 2017-05-15 DIAGNOSIS — I10 Essential (primary) hypertension: Secondary | ICD-10-CM | POA: Diagnosis not present

## 2017-05-15 DIAGNOSIS — I739 Peripheral vascular disease, unspecified: Secondary | ICD-10-CM | POA: Diagnosis not present

## 2017-05-16 DIAGNOSIS — Z794 Long term (current) use of insulin: Secondary | ICD-10-CM | POA: Diagnosis not present

## 2017-05-16 DIAGNOSIS — I10 Essential (primary) hypertension: Secondary | ICD-10-CM | POA: Diagnosis not present

## 2017-05-16 DIAGNOSIS — E1165 Type 2 diabetes mellitus with hyperglycemia: Secondary | ICD-10-CM | POA: Diagnosis not present

## 2017-05-17 DIAGNOSIS — I491 Atrial premature depolarization: Secondary | ICD-10-CM | POA: Diagnosis not present

## 2017-05-19 DIAGNOSIS — I10 Essential (primary) hypertension: Secondary | ICD-10-CM | POA: Diagnosis not present

## 2017-05-19 DIAGNOSIS — E782 Mixed hyperlipidemia: Secondary | ICD-10-CM | POA: Diagnosis not present

## 2017-05-19 DIAGNOSIS — E1165 Type 2 diabetes mellitus with hyperglycemia: Secondary | ICD-10-CM | POA: Diagnosis not present

## 2017-05-19 DIAGNOSIS — Z794 Long term (current) use of insulin: Secondary | ICD-10-CM | POA: Diagnosis not present

## 2017-05-21 ENCOUNTER — Other Ambulatory Visit: Payer: Self-pay | Admitting: Cardiovascular Disease

## 2017-05-26 ENCOUNTER — Encounter: Payer: Self-pay | Admitting: Physician Assistant

## 2017-05-26 ENCOUNTER — Ambulatory Visit (INDEPENDENT_AMBULATORY_CARE_PROVIDER_SITE_OTHER): Payer: Medicare Other | Admitting: Physician Assistant

## 2017-05-26 VITALS — BP 126/50 | HR 86 | Ht 73.0 in | Wt 252.0 lb

## 2017-05-26 DIAGNOSIS — Z01818 Encounter for other preprocedural examination: Secondary | ICD-10-CM

## 2017-05-26 DIAGNOSIS — Z01810 Encounter for preprocedural cardiovascular examination: Secondary | ICD-10-CM

## 2017-05-26 DIAGNOSIS — R0602 Shortness of breath: Secondary | ICD-10-CM | POA: Diagnosis not present

## 2017-05-26 DIAGNOSIS — R0609 Other forms of dyspnea: Secondary | ICD-10-CM

## 2017-05-26 NOTE — Patient Instructions (Addendum)
Medication Instructions:   Your physician recommends that you continue on your current medications as directed. Please refer to the Current Medication list given to you today.  If you need a refill on your cardiac medications before your next appointment, please call your pharmacy.  Labwork: NONE ORDERED  TODAY    Testing/Procedures: ASAP.BEFORE TEST .Your physician has requested that you have a lexiscan myoview. For further information please visit [email or web address]. Please follow instruction sheet, as given.    Follow-Up:  BASED UPON RESULTS...   Any Other Special Instructions Will Be Listed Below (If Applicable).
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
## 2017-05-26 NOTE — Progress Notes (Signed)
thx

## 2017-06-01 ENCOUNTER — Telehealth (HOSPITAL_COMMUNITY): Payer: Self-pay | Admitting: *Deleted

## 2017-06-01 NOTE — Telephone Encounter (Signed)
Patient given detailed instructions per Myocardial Perfusion Study Information Sheet for the test on 06/04/17 Patient notified to arrive 15 minutes early and that it is imperative to arrive on time for appointment to keep from having the test rescheduled.  If you need to cancel or reschedule your appointment, please call the office within 24 hours of your appointment. . Patient verbalized understanding. [name]

## 2017-06-03 DIAGNOSIS — G894 Chronic pain syndrome: Secondary | ICD-10-CM | POA: Diagnosis not present

## 2017-06-03 DIAGNOSIS — G546 Phantom limb syndrome with pain: Secondary | ICD-10-CM | POA: Diagnosis not present

## 2017-06-03 DIAGNOSIS — M4696 Unspecified inflammatory spondylopathy, lumbar region: Secondary | ICD-10-CM | POA: Diagnosis not present

## 2017-06-03 DIAGNOSIS — I739 Peripheral vascular disease, unspecified: Secondary | ICD-10-CM | POA: Diagnosis not present

## 2017-06-03 DIAGNOSIS — M961 Postlaminectomy syndrome, not elsewhere classified: Secondary | ICD-10-CM | POA: Diagnosis not present

## 2017-06-03 DIAGNOSIS — M19019 Primary osteoarthritis, unspecified shoulder: Secondary | ICD-10-CM | POA: Diagnosis not present

## 2017-06-04 ENCOUNTER — Other Ambulatory Visit (HOSPITAL_COMMUNITY): Payer: Self-pay | Admitting: Emergency Medicine

## 2017-06-04 ENCOUNTER — Ambulatory Visit (HOSPITAL_COMMUNITY): Payer: Medicare Other | Attending: Cardiology

## 2017-06-04 DIAGNOSIS — Z794 Long term (current) use of insulin: Secondary | ICD-10-CM | POA: Insufficient documentation

## 2017-06-04 DIAGNOSIS — I2510 Atherosclerotic heart disease of native coronary artery without angina pectoris: Secondary | ICD-10-CM | POA: Diagnosis not present

## 2017-06-04 DIAGNOSIS — R0602 Shortness of breath: Secondary | ICD-10-CM | POA: Diagnosis not present

## 2017-06-04 DIAGNOSIS — E119 Type 2 diabetes mellitus without complications: Secondary | ICD-10-CM | POA: Insufficient documentation

## 2017-06-04 DIAGNOSIS — I10 Essential (primary) hypertension: Secondary | ICD-10-CM | POA: Insufficient documentation

## 2017-06-04 LAB — MYOCARDIAL PERFUSION IMAGING
CHL CUP NUCLEAR SDS: 0
CHL CUP NUCLEAR SRS: 4
CHL CUP RESTING HR STRESS: 73 {beats}/min
LV dias vol: 108 mL (ref 62–150)
LV sys vol: 37 mL
NUC STRESS TID: 1.02
Peak HR: 106 {beats}/min
RATE: 0.36
SSS: 4

## 2017-06-04 MED ORDER — REGADENOSON 0.4 MG/5ML IV SOLN
0.4000 mg | Freq: Once | INTRAVENOUS | Status: AC
  Administered 2017-06-04: 0.4 mg via INTRAVENOUS

## 2017-06-04 MED ORDER — TECHNETIUM TC 99M TETROFOSMIN IV KIT
11.0000 | PACK | Freq: Once | INTRAVENOUS | Status: AC | PRN
  Administered 2017-06-04: 11 via INTRAVENOUS
  Filled 2017-06-04: qty 11

## 2017-06-04 MED ORDER — TECHNETIUM TC 99M TETROFOSMIN IV KIT
31.6000 | PACK | Freq: Once | INTRAVENOUS | Status: AC | PRN
  Administered 2017-06-04: 31.6 via INTRAVENOUS
  Filled 2017-06-04: qty 32

## 2017-06-04 NOTE — Progress Notes (Signed)
[place] cardiology Bhagat PA 05-26-17 epic  [facility] Endocrinology Dr Posey Pronto 05-19-17 on chart   EKG 05-26-17 epic  Stress Test 12-04-15 epic  ECHO 12-07-15 epic  HgA1c 05-19-17 on chart from [facility]  HgA1c , CMP, UA, CBCdiff 05-16-17 on chart from [facility] Endocrinology

## 2017-06-04 NOTE — Patient Instructions (Addendum)
[place]  06/04/2017   Your procedure is scheduled on: 06-12-17  Report to [facility] Main  Entrance Take [place]  elevators to 3rd floor to  [place] at 1:45AM.   Call this number if you have problems the morning of surgery [phone]   Remember: ONLY 1 PERSON MAY GO WITH YOU TO SHORT STAY TO GET  READY MORNING OF YOUR SURGERY.  Do not eat food :After Midnight. You may have clear liquids from midnight until 945am day of surgery. Nothing by mouth after 945am!     Take these medicines the morning of surgery with A SIP OF WATER: tylenol if needed, duloxetine(cymbalta), ativan if needed, zegerid, oxycodone if needed,                                 You may not have any metal on your body including hair pins and              piercings  Do not wear jewelry, make-up, lotions, powders or perfumes, deodorant              Men may shave face and neck.   Do not bring valuables to the hospital. [place].  Contacts, dentures or bridgework may not be worn into surgery.      Patients discharged the day of surgery will not be allowed to drive home.  Name and phone number of your driver:  Special Instructions: N/A              Please read over the following fact sheets you were given: _____________________________________________________________________   CLEAR LIQUID DIET   Foods Allowed                                                                     Foods Excluded  Coffee and tea, regular and decaf                             liquids that you cannot  Plain Jell-O in any flavor                                             see through such as: Fruit ices (not with fruit pulp)                                     milk, soups, orange juice  Iced Popsicles                                    All solid food Carbonated beverages, regular and diet  Cranberry, grape and  apple juices Sports drinks like Gatorade Lightly seasoned clear broth or consume(fat free) Sugar, honey syrup  Sample Menu Breakfast                                Lunch                                     Supper Cranberry juice                    Beef broth                            Chicken broth Jell-O                                     Grape juice                           Apple juice Coffee or tea                        Jell-O                                      Popsicle                                                Coffee or tea                        Coffee or tea  _____________________________________________________________________             How to Manage Your Diabetes Before and After Surgery  Why is it important to control my blood sugar before and after surgery? . Improving blood sugar levels before and after surgery helps healing and can limit problems. . A way of improving blood sugar control is eating a healthy diet by: o  Eating less sugar and carbohydrates o  Increasing activity/exercise o  Talking with your doctor about reaching your blood sugar goals . High blood sugars (greater than 180 mg/dL) can raise your risk of infections and slow your recovery, so you will need to focus on controlling your diabetes during the weeks before surgery. . Make sure that the doctor who takes care of your diabetes knows about your planned surgery including the date and location.  How do I manage my blood sugar before surgery? . Check your blood sugar at least 4 times a day, starting 2 days before surgery, to make sure that the level is not too high or low. o Check your blood sugar the morning of your surgery when you wake up and every 2 hours until you get to the Short Stay unit. . If your blood sugar is less than 70 mg/dL, you will need to treat for low blood sugar: o Do not take insulin. o Treat a low blood sugar (less than 70 mg/dL) with  cup of clear  juice (cranberry or  apple), 4 glucose tablets, OR glucose gel. o Recheck blood sugar in 15 minutes after treatment (to make sure it is greater than 70 mg/dL). If your blood sugar is not greater than 70 mg/dL on recheck, call [phone] for further instructions. . Report your blood sugar to the short stay nurse when you get to Short Stay.  . If you are admitted to the hospital after surgery: o Your blood sugar will be checked by the staff and you will probably be given insulin after surgery (instead of oral diabetes medicines) to make sure you have good blood sugar levels. o The goal for blood sugar control after surgery is 80-180 mg/dL.   WHAT DO I DO ABOUT MY DIABETES MEDICATION?  PLEASE CONTACT YOUR PRIMARY CARE PHYSICIAN OR ENDOCRINOLOGIST REGARDING HUMULIN U-500 INSULIN INSTRUCTIONS FOR 1)THE DAY BEFORE YOUR SURGERY AND 2) THE DAY OF YOUR SURGERY !!!  Patient Signature:  Date:   Nurse Signature:  Date:   Reviewed and Endorsed by [facility] Patient Education Committee, August 2015   [facility] - Preparing for Surgery Before surgery, you can play an important role.  Because skin is not sterile, your skin needs to be as free of germs as possible.  You can reduce the number of germs on your skin by washing with CHG (chlorahexidine gluconate) soap before surgery.  CHG is an antiseptic cleaner which kills germs and bonds with the skin to continue killing germs even after washing. Please DO NOT use if you have an allergy to CHG or antibacterial soaps.  If your skin becomes reddened/irritated stop using the CHG and inform your nurse when you arrive at Short Stay. Do not shave (including legs and underarms) for at least 48 hours prior to the first CHG shower.  You may shave your face/neck. Please follow these instructions carefully:  1.  Shower with CHG Soap the night before surgery and the  morning of Surgery.  2.  If you choose to wash your hair, wash your hair first as usual with your  normal  shampoo.  3.   After you shampoo, rinse your hair and body thoroughly to remove the  shampoo.                           4.  Use CHG as you would any other liquid soap.  You can apply chg directly  to the skin and wash                       Gently with a scrungie or clean washcloth.  5.  Apply the CHG Soap to your body ONLY FROM THE NECK DOWN.   Do not use on face/ open                           Wound or open sores. Avoid contact with eyes, ears mouth and genitals (private parts).                       Wash face,  Genitals (private parts) with your normal soap.             6.  Wash thoroughly, paying special attention to the area where your surgery  will be performed.  7.  Thoroughly rinse your body with warm water from the neck down.  8.  DO NOT shower/wash with your normal soap  after using and rinsing off  the CHG Soap.                9.  Pat yourself dry with a clean towel.            10.  Wear clean pajamas.            11.  Place clean sheets on your bed the night of your first shower and do not  sleep with pets. Day of Surgery : Do not apply any lotions/deodorants the morning of surgery.  Please wear clean clothes to the hospital/surgery center.  FAILURE TO FOLLOW THESE INSTRUCTIONS MAY RESULT IN THE CANCELLATION OF YOUR SURGERY PATIENT SIGNATURE_________________________________  NURSE SIGNATURE__________________________________  ________________________________________________________________________

## 2017-06-05 ENCOUNTER — Encounter (HOSPITAL_COMMUNITY)
Admission: RE | Admit: 2017-06-05 | Discharge: 2017-06-05 | Disposition: A | Discharge status: Home / Self Care | Payer: Medicare Other | Source: Ambulatory Visit | Attending: Urology | Admitting: Urology

## 2017-06-05 ENCOUNTER — Encounter (HOSPITAL_COMMUNITY): Payer: Self-pay

## 2017-06-05 DIAGNOSIS — N200 Calculus of kidney: Secondary | ICD-10-CM | POA: Diagnosis not present

## 2017-06-05 DIAGNOSIS — Z01812 Encounter for preprocedural laboratory examination: Secondary | ICD-10-CM | POA: Insufficient documentation

## 2017-06-05 LAB — GLUCOSE, CAPILLARY: GLUCOSE-CAPILLARY: 207 mg/dL — AB (ref 65–99)

## 2017-06-05 LAB — SURGICAL PCR SCREEN
MRSA, PCR: NEGATIVE
STAPHYLOCOCCUS AUREUS: NEGATIVE

## 2017-06-05 NOTE — Progress Notes (Signed)
RN spoke face to face with Dr Belinda Block in anesthesiology for consult d/t patient difficult airway  And complicated cardiac history. Dr Nyoka Cowden was ablet o pul up patient in epic and review chart. Per MD, patient should be able to proceed as planned with surgery and airway will be managed as appropriate day of surgery. In reference to recent stress test, MD requiring patient have written clearance from cardiologist to proceed . RN relayed anesthesia recommendations to patient and his wife that was present. Both verbalized understanding . Wife says she will get in touch with cardiologist office and "get it done".

## 2017-06-05 NOTE — Progress Notes (Signed)
Stress test 06-04-17 epic

## 2017-06-05 NOTE — Progress Notes (Signed)
Cardiology clearance Bhagat PA note on  06-05-17 epic included with stress test results dated  06-04-17 .

## 2017-06-09 DIAGNOSIS — N200 Calculus of kidney: Secondary | ICD-10-CM | POA: Diagnosis not present

## 2017-06-12 ENCOUNTER — Ambulatory Visit (HOSPITAL_COMMUNITY): Payer: Medicare Other

## 2017-06-12 ENCOUNTER — Ambulatory Visit (HOSPITAL_COMMUNITY): Payer: Medicare Other | Admitting: Anesthesiology

## 2017-06-12 ENCOUNTER — Encounter (HOSPITAL_COMMUNITY)
Admission: RE | Disposition: A | Discharge status: Home / Self Care | Payer: Self-pay | Source: Ambulatory Visit | Attending: Urology

## 2017-06-12 ENCOUNTER — Encounter (HOSPITAL_COMMUNITY): Payer: Self-pay | Admitting: *Deleted

## 2017-06-12 ENCOUNTER — Ambulatory Visit (HOSPITAL_COMMUNITY)
Admission: RE | Admit: 2017-06-12 | Discharge: 2017-06-12 | Disposition: A | Discharge status: Home / Self Care | Payer: Medicare Other | Source: Ambulatory Visit | Attending: Urology | Admitting: Urology

## 2017-06-12 DIAGNOSIS — I110 Hypertensive heart disease with heart failure: Secondary | ICD-10-CM | POA: Diagnosis not present

## 2017-06-12 DIAGNOSIS — M48062 Spinal stenosis, lumbar region with neurogenic claudication: Secondary | ICD-10-CM | POA: Diagnosis not present

## 2017-06-12 DIAGNOSIS — I2510 Atherosclerotic heart disease of native coronary artery without angina pectoris: Secondary | ICD-10-CM | POA: Diagnosis not present

## 2017-06-12 DIAGNOSIS — G4730 Sleep apnea, unspecified: Secondary | ICD-10-CM | POA: Diagnosis not present

## 2017-06-12 DIAGNOSIS — G894 Chronic pain syndrome: Secondary | ICD-10-CM | POA: Insufficient documentation

## 2017-06-12 DIAGNOSIS — Z91048 Other nonmedicinal substance allergy status: Secondary | ICD-10-CM | POA: Insufficient documentation

## 2017-06-12 DIAGNOSIS — Z89511 Acquired absence of right leg below knee: Secondary | ICD-10-CM | POA: Insufficient documentation

## 2017-06-12 DIAGNOSIS — Z881 Allergy status to other antibiotic agents status: Secondary | ICD-10-CM | POA: Insufficient documentation

## 2017-06-12 DIAGNOSIS — N281 Cyst of kidney, acquired: Secondary | ICD-10-CM | POA: Diagnosis not present

## 2017-06-12 DIAGNOSIS — I5032 Chronic diastolic (congestive) heart failure: Secondary | ICD-10-CM | POA: Diagnosis not present

## 2017-06-12 DIAGNOSIS — E119 Type 2 diabetes mellitus without complications: Secondary | ICD-10-CM | POA: Diagnosis not present

## 2017-06-12 DIAGNOSIS — G629 Polyneuropathy, unspecified: Secondary | ICD-10-CM | POA: Diagnosis not present

## 2017-06-12 DIAGNOSIS — M5136 Other intervertebral disc degeneration, lumbar region: Secondary | ICD-10-CM | POA: Diagnosis not present

## 2017-06-12 DIAGNOSIS — N401 Enlarged prostate with lower urinary tract symptoms: Secondary | ICD-10-CM | POA: Diagnosis not present

## 2017-06-12 DIAGNOSIS — Z885 Allergy status to narcotic agent status: Secondary | ICD-10-CM | POA: Insufficient documentation

## 2017-06-12 DIAGNOSIS — K219 Gastro-esophageal reflux disease without esophagitis: Secondary | ICD-10-CM | POA: Insufficient documentation

## 2017-06-12 DIAGNOSIS — Z85828 Personal history of other malignant neoplasm of skin: Secondary | ICD-10-CM | POA: Diagnosis not present

## 2017-06-12 DIAGNOSIS — J449 Chronic obstructive pulmonary disease, unspecified: Secondary | ICD-10-CM | POA: Diagnosis not present

## 2017-06-12 DIAGNOSIS — F329 Major depressive disorder, single episode, unspecified: Secondary | ICD-10-CM | POA: Insufficient documentation

## 2017-06-12 DIAGNOSIS — Z87442 Personal history of urinary calculi: Secondary | ICD-10-CM | POA: Diagnosis not present

## 2017-06-12 DIAGNOSIS — K449 Diaphragmatic hernia without obstruction or gangrene: Secondary | ICD-10-CM | POA: Diagnosis not present

## 2017-06-12 DIAGNOSIS — Z794 Long term (current) use of insulin: Secondary | ICD-10-CM | POA: Insufficient documentation

## 2017-06-12 DIAGNOSIS — K3184 Gastroparesis: Secondary | ICD-10-CM | POA: Diagnosis not present

## 2017-06-12 DIAGNOSIS — N200 Calculus of kidney: Secondary | ICD-10-CM | POA: Insufficient documentation

## 2017-06-12 DIAGNOSIS — Z7982 Long term (current) use of aspirin: Secondary | ICD-10-CM | POA: Insufficient documentation

## 2017-06-12 DIAGNOSIS — Z87891 Personal history of nicotine dependence: Secondary | ICD-10-CM | POA: Insufficient documentation

## 2017-06-12 DIAGNOSIS — Z961 Presence of intraocular lens: Secondary | ICD-10-CM | POA: Diagnosis not present

## 2017-06-12 DIAGNOSIS — E785 Hyperlipidemia, unspecified: Secondary | ICD-10-CM | POA: Diagnosis not present

## 2017-06-12 DIAGNOSIS — Z8614 Personal history of Methicillin resistant Staphylococcus aureus infection: Secondary | ICD-10-CM | POA: Insufficient documentation

## 2017-06-12 DIAGNOSIS — Z79899 Other long term (current) drug therapy: Secondary | ICD-10-CM | POA: Insufficient documentation

## 2017-06-12 DIAGNOSIS — N3001 Acute cystitis with hematuria: Secondary | ICD-10-CM | POA: Diagnosis not present

## 2017-06-12 DIAGNOSIS — F419 Anxiety disorder, unspecified: Secondary | ICD-10-CM | POA: Insufficient documentation

## 2017-06-12 DIAGNOSIS — Z888 Allergy status to other drugs, medicaments and biological substances status: Secondary | ICD-10-CM | POA: Insufficient documentation

## 2017-06-12 DIAGNOSIS — I10 Essential (primary) hypertension: Secondary | ICD-10-CM | POA: Diagnosis not present

## 2017-06-12 HISTORY — PX: HOLMIUM LASER APPLICATION: SHX5852

## 2017-06-12 HISTORY — PX: CYSTOSCOPY WITH RETROGRADE PYELOGRAM, URETEROSCOPY AND STENT PLACEMENT: SHX5789

## 2017-06-12 LAB — GLUCOSE, CAPILLARY: Glucose-Capillary: 99 mg/dL (ref 65–99)

## 2017-06-12 SURGERY — CYSTOURETEROSCOPY, WITH RETROGRADE PYELOGRAM AND STENT INSERTION
Anesthesia: General | Site: Bladder | Laterality: Bilateral

## 2017-06-12 MED ORDER — PROMETHAZINE HCL 25 MG PO TABS: 25.0000 mg | ORAL_TABLET | ORAL | 0 refills | Status: DC | PRN

## 2017-06-12 MED ORDER — FENTANYL CITRATE (PF) 100 MCG/2ML IJ SOLN
25.0000 ug | INTRAMUSCULAR | Status: DC | PRN
  Administered 2017-06-12 (×2): 50 ug via INTRAVENOUS

## 2017-06-12 MED ORDER — ONDANSETRON HCL 4 MG/2ML IJ SOLN
INTRAMUSCULAR | Status: DC | PRN
  Administered 2017-06-12: 4 mg via INTRAVENOUS

## 2017-06-12 MED ORDER — FENTANYL CITRATE (PF) 100 MCG/2ML IJ SOLN
INTRAMUSCULAR | Status: AC
  Filled 2017-06-12: qty 2

## 2017-06-12 MED ORDER — SODIUM CHLORIDE 0.9 % IR SOLN
Status: DC | PRN
  Administered 2017-06-12: 3000 mL

## 2017-06-12 MED ORDER — SUCCINYLCHOLINE CHLORIDE 200 MG/10ML IV SOSY
PREFILLED_SYRINGE | INTRAVENOUS | Status: DC | PRN
  Administered 2017-06-12: 180 mg via INTRAVENOUS

## 2017-06-12 MED ORDER — PROPOFOL 10 MG/ML IV BOLUS
INTRAVENOUS | Status: DC | PRN
  Administered 2017-06-12: 180 mg via INTRAVENOUS

## 2017-06-12 MED ORDER — OXYCODONE HCL 5 MG/5ML PO SOLN: 5.0000 mg | Freq: Once | ORAL | Status: AC | PRN

## 2017-06-12 MED ORDER — OXYCODONE HCL 5 MG PO TABS
15.0000 mg | ORAL_TABLET | Freq: Once | ORAL | Status: AC
  Administered 2017-06-12: 15 mg via ORAL
  Filled 2017-06-12: qty 3

## 2017-06-12 MED ORDER — LIDOCAINE HCL 2 % EX GEL
CUTANEOUS | Status: AC
  Filled 2017-06-12: qty 5

## 2017-06-12 MED ORDER — FENTANYL CITRATE (PF) 100 MCG/2ML IJ SOLN
INTRAMUSCULAR | Status: DC | PRN
  Administered 2017-06-12: 25 ug via INTRAVENOUS
  Administered 2017-06-12 (×2): 50 ug via INTRAVENOUS
  Administered 2017-06-12 (×2): 25 ug via INTRAVENOUS
  Administered 2017-06-12 (×2): 50 ug via INTRAVENOUS
  Administered 2017-06-12: 25 ug via INTRAVENOUS

## 2017-06-12 MED ORDER — IOHEXOL 300 MG/ML  SOLN
INTRAMUSCULAR | Status: DC | PRN
  Administered 2017-06-12: 20 mL

## 2017-06-12 MED ORDER — OXYCODONE HCL 5 MG PO TABS
5.0000 mg | ORAL_TABLET | Freq: Once | ORAL | Status: AC | PRN
  Administered 2017-06-12: 5 mg via ORAL
  Filled 2017-06-12: qty 1

## 2017-06-12 MED ORDER — PROPOFOL 10 MG/ML IV BOLUS
INTRAVENOUS | Status: AC
  Filled 2017-06-12: qty 20

## 2017-06-12 MED ORDER — PROMETHAZINE HCL 25 MG/ML IJ SOLN: 6.2500 mg | INTRAMUSCULAR | Status: DC | PRN

## 2017-06-12 MED ORDER — DEXAMETHASONE SODIUM PHOSPHATE 10 MG/ML IJ SOLN
INTRAMUSCULAR | Status: AC
  Filled 2017-06-12: qty 1

## 2017-06-12 MED ORDER — ONDANSETRON HCL 4 MG/2ML IJ SOLN
INTRAMUSCULAR | Status: AC
  Filled 2017-06-12: qty 2

## 2017-06-12 MED ORDER — LACTATED RINGERS IV SOLN
INTRAVENOUS | Status: DC
  Administered 2017-06-12: 14:00:00 via INTRAVENOUS

## 2017-06-12 MED ORDER — MEPERIDINE HCL 50 MG/ML IJ SOLN: 6.2500 mg | INTRAMUSCULAR | Status: DC | PRN

## 2017-06-12 MED ORDER — LIDOCAINE 2% (20 MG/ML) 5 ML SYRINGE
INTRAMUSCULAR | Status: DC | PRN
  Administered 2017-06-12: 50 mg via INTRAVENOUS

## 2017-06-12 MED ORDER — DEXTROSE 5 % IV SOLN
2.0000 g | INTRAVENOUS | Status: AC
  Administered 2017-06-12: 2 g via INTRAVENOUS
  Filled 2017-06-12: qty 2

## 2017-06-12 MED ORDER — OXYCODONE HCL 20 MG PO TABS: 20.0000 mg | ORAL_TABLET | ORAL | 0 refills | Status: DC | PRN

## 2017-06-12 SURGICAL SUPPLY — 23 items
BAG URO CATCHER STRL LF (MISCELLANEOUS) ×3 IMPLANT
CATH INTERMIT  6FR 70CM (CATHETERS) ×3 IMPLANT
CLOTH BEACON ORANGE TIMEOUT ST (SAFETY) ×3 IMPLANT
COVER FOOTSWITCH UNIV (MISCELLANEOUS) ×2 IMPLANT
COVER SURGICAL LIGHT HANDLE (MISCELLANEOUS) ×3 IMPLANT
EXTRACTOR STONE NITINOL NGAGE (UROLOGICAL SUPPLIES) ×2 IMPLANT
FIBER LASER FLEXIVA 1000 (UROLOGICAL SUPPLIES) IMPLANT
FIBER LASER FLEXIVA 365 (UROLOGICAL SUPPLIES) IMPLANT
FIBER LASER FLEXIVA 550 (UROLOGICAL SUPPLIES) IMPLANT
FIBER LASER TRAC TIP (UROLOGICAL SUPPLIES) ×2 IMPLANT
GLOVE BIO SURGEON STRL SZ8 (GLOVE) ×3 IMPLANT
GOWN STRL REUS W/TWL XL LVL3 (GOWN DISPOSABLE) ×3 IMPLANT
GUIDEWIRE ANG ZIPWIRE 038X150 (WIRE) ×3 IMPLANT
GUIDEWIRE STR DUAL SENSOR (WIRE) IMPLANT
IV NS 1000ML (IV SOLUTION) ×3
IV NS 1000ML BAXH (IV SOLUTION) ×1 IMPLANT
MANIFOLD NEPTUNE II (INSTRUMENTS) ×3 IMPLANT
PACK CYSTO (CUSTOM PROCEDURE TRAY) ×3 IMPLANT
SHEATH ACCESS URETERAL 38CM (SHEATH) ×2 IMPLANT
STENT CONTOUR 6FRX26X.038 (STENTS) ×4 IMPLANT
TUBE FEEDING 8FR 16IN STR KANG (MISCELLANEOUS) IMPLANT
TUBING CONNECTING 10 (TUBING) ×2 IMPLANT
TUBING CONNECTING 10' (TUBING) ×1

## 2017-06-12 NOTE — Progress Notes (Signed)
Dr Sabra Heck aware of elevated BP in pacu.  No orders received. May continue with discharge home

## 2017-06-12 NOTE — Progress Notes (Signed)
Pt states wishes to be discharged home

## 2017-06-12 NOTE — Progress Notes (Signed)
After pt dressed for discharge vomited moderate amount of dark brown liquid. Pt stated he felt better and wanted to continue to be discharged. This nurse offered nausea medication but pt stated he wanted to wait till he got home. Cold cloth given with emesis bag. Pt with family and stated he just wanted to get to his home

## 2017-06-12 NOTE — Anesthesia Procedure Notes (Addendum)
Procedure Name: Intubation Date/Time: 06/12/2017 3:45 PM Performed by: [name] Fu Pre-anesthesia Checklist: Patient identified, Emergency Drugs available, Suction available, Patient being monitored and Timeout performed Patient Re-evaluated:Patient Re-evaluated prior to induction Oxygen Delivery Method: Circle system utilized Preoxygenation: Pre-oxygenation with 100% oxygen Induction Type: IV induction Ventilation: Mask ventilation without difficulty Laryngoscope Size: Mac and 4 Grade View: Grade II Tube type: Oral Tube size: 7.5 mm Number of attempts: 1 Airway Equipment and Method: Stylet Placement Confirmation: ETT inserted through vocal cords under direct vision,  positive ETCO2 and breath sounds checked- equal and bilateral Secured at: 23 cm Tube secured with: Tape Dental Injury: Teeth and Oropharynx as per pre-operative assessment  Difficulty Due To: Difficulty was anticipated and Difficult Airway- due to anterior larynx Comments: Grade II view due to upper and lower beard that gets into field of view during procedure.

## 2017-06-12 NOTE — Transfer of Care (Signed)
Immediate Anesthesia Transfer of Care Note  Patient: [name]  Procedure(s) Performed: Procedure(s): CYSTOSCOPY WITH RETROGRADE PYELOGRAM, URETEROSCOPY AND STENT PLACEMENT (Bilateral) HOLMIUM LASER APPLICATION (Bilateral)  Patient Location: PACU  Anesthesia Type:General  Level of Consciousness:  sedated, patient cooperative and responds to stimulation  Airway & Oxygen Therapy:Patient Spontanous Breathing and Patient connected to face mask oxgen  Post-op Assessment:  Report given to PACU RN and Post -op Vital signs reviewed and stable  Post vital signs:  Reviewed and stable  Last Vitals:  Vitals:   06/12/17 1340  BP: (!) 141/64  Pulse: 74  Resp: 16  Temp: 37.1 C  SpO2: 16%    Complications: No apparent anesthesia complications

## 2017-06-12 NOTE — Anesthesia Preprocedure Evaluation (Addendum)
Anesthesia Evaluation  Patient identified by MRN, date of birth, ID band Patient awake    Reviewed: Allergy & Precautions, NPO status , Patient's Chart, lab work & pertinent test results  Airway Mallampati: I  TM Distance: >3 FB Neck ROM: Full    Dental   Pulmonary sleep apnea , COPD, former smoker,    Pulmonary exam normal        Cardiovascular hypertension, Pt. on medications + CAD  Normal cardiovascular exam     Neuro/Psych Anxiety Depression    GI/Hepatic GERD  Controlled and Medicated,  Endo/Other  diabetes, Type 2, Insulin Dependent  Renal/GU      Musculoskeletal   Abdominal   Peds  Hematology   Anesthesia Other Findings   Reproductive/Obstetrics                            Anesthesia Physical  Anesthesia Plan  ASA: III  Anesthesia Plan: General   Post-op Pain Management:    Induction: Intravenous, Rapid sequence and Cricoid pressure planned  PONV Risk Score and Plan: 1 and Ondansetron and Treatment may vary due to age or medical condition  Airway Management Planned: Oral ETT  Additional Equipment:   Intra-op Plan:   Post-operative Plan: Extubation in OR  Informed Consent: I have reviewed the patients History and Physical, chart, labs and discussed the procedure including the risks, benefits and alternatives for the proposed anesthesia with the patient or authorized representative who has indicated his/her understanding and acceptance.     Plan Discussed with: CRNA and Surgeon  Anesthesia Plan Comments:        Anesthesia Quick Evaluation                                   Anesthesia Evaluation  Patient identified by MRN, date of birth, ID band Patient awake    Reviewed: Allergy & Precautions, NPO status , Patient's Chart, lab work & pertinent test results  History of Anesthesia Complications (+) history of anesthetic complications (Pt had regurgitation on  one GA RSI. Pt was noted to have large amount of stomach contents with his Upper Endoscopy today( 01-16-17) and therefore, I think with his Diabetes he is likely to always be an aspiration risk due to GastroparesisPhoebe Perch MD).)  Airway Mallampati: I  TM Distance: >3 FB Neck ROM: Full    Dental   Pulmonary sleep apnea , former smoker,    Pulmonary exam normal        Cardiovascular hypertension, Pt. on medications + CAD  Normal cardiovascular exam     Neuro/Psych Anxiety Depression    GI/Hepatic GERD  Controlled and Medicated,  Endo/Other  diabetes  Renal/GU      Musculoskeletal   Abdominal   Peds  Hematology   Anesthesia Other Findings Pt had regurgitation on most recent GA  DI not described in prev notes. GA/ RSI good view:  Intubation Type: IV induction, Rapid sequence and Cricoid Pressure applied Ventilation: Unable to mask ventilate(beard?) Laryngoscope Size: Mac and 3 Grade View: Grade I Tube type: Oral Tube size: 7.5 mm Number of attempts: 1 Airway Equipment and Method: Stylet Placement Confirmation: ETT inserted through vocal cords under direct vision,  positive ETCO2 and breath sounds checked- equal and bilateral  Reproductive/Obstetrics  Anesthesia Physical  Anesthesia Plan  ASA: II  Anesthesia Plan: MAC   Post-op Pain Management:    Induction: Intravenous  Airway Management Planned: Mask and Natural Airway  Additional Equipment:   Intra-op Plan:   Post-operative Plan:   Informed Consent: I have reviewed the patients History and Physical, chart, labs and discussed the procedure including the risks, benefits and alternatives for the proposed anesthesia with the patient or authorized representative who has indicated his/her understanding and acceptance.     Plan Discussed with: CRNA and Surgeon  Anesthesia Plan Comments: (MAC: Have glidescope available)       Anesthesia  Quick Evaluation

## 2017-06-12 NOTE — Discharge Instructions (Signed)
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
## 2017-06-12 NOTE — Op Note (Signed)
Marland KitchenPreoperative diagnosis: bilateral renal calculi  Postoperative diagnosis: Same  Procedure: 1 cystoscopy 2. bilateralretrograde pyelography 3.  Intraoperative fluoroscopy, under one hour, with interpretation 4.  Bilateral ureteroscopic stone manipulation with laser lithotripsy  5.  bilateral 6 x 26 JJ stent placement  Attending: [name]  Anesthesia: General  Estimated blood loss: None  Drains: bilateral 6 x 26 JJ ureteral stent without tether  Specimens: stone for analysis  Antibiotics: ancef  Findings: bilateral upper, mid and lower pole renal calculi. No hydronephrosis. No masses/lesions in the bladder. Ureteral orifices in normal anatomic location.  Indications: Patient is a 66 year old male with a history of bilateral renal calculi and bilateral flank pain. After discussing treatment options, they decided proceed with bilateral ureteroscopic stone manipulation.  Procedure her in detail: The patient was brought to the operating room and a brief timeout was done to ensure correct patient, correct procedure, correct site.  General anesthesia was administered patient was placed in dorsal lithotomy position.  Her genitalia was then prepped and draped in usual sterile fashion.  A rigid 11 French cystoscope was passed in the urethra and the bladder.  Bladder was inspected free masses or lesions.  the ureteral orifices were in the normal orthotopic locations. a 6 french ureteral catheter was then instilled into the left ureteral orifice.  a gentle retrograde was obtained and findings noted above. We then advanced a zipwire through the catheter and up to the renal pelvis.  we then removed the cystoscope and cannulated the left ureteral orifice with a semirigid ureteroscope.  We located no stone in the ureter. We then placed a sensor wire up to the renal pelvis. We removed the scope and advanced a 12/14 x 38cm access sheath up to the renal pelvis. We then used the flexible ureteroscope  to perform nephroscopy. We located calculi in the upper, mid and lower poles. Using a 200nm laser fiber the stones were fragmented and the fragments were removed with an NGage basket. Once the stone were removed we then removed the access sheath under direct vision and noted to injury to the ureter.  We then placed a 6 x 26 double-j ureteral stent over the original zip wire. We then removed the wire and good coil was noted in the the renal pelvis under fluoroscopy and the bladder under direct vision.  We then turned out attention to the right side. We then advanced a zipwire through the catheter and up to the renal pelvis.  we then removed the cystoscope and cannulated the left ureteral orifice with a semirigid ureteroscope.  We located no stone in the ureter. We then placed a sensor wire up to the renal pelvis. We removed the scope and advanced a 12/14 x 38cm access sheath up to the renal pelvis. We then used the flexible ureteroscope to perform nephroscopy. WWe located calculi in the upper, mid and lower poles. Using a 200nm laser fiber the stones were fragmented and the fragments were removed with an NGage basket. Once the stone were removed we then removed the access sheath under direct vision and noted to injury to the ureter. we then placed a 6 x 26 double-j ureteral stent over the original zip wire.  We then removed the wire and good coil was noted in the the renal pelvis under fluoroscopy and the bladder under direct vision.   the bladder was then drained and this concluded the procedure which was well tolerated by patient.  Complications: None  Condition: Stable, extubated, transferred to PACU  Plan: Patient is to be discharged home as to follow-up in 1 week for stent removal

## 2017-06-12 NOTE — Anesthesia Postprocedure Evaluation (Signed)
Anesthesia Post Note  Patient: [name]  Procedure(s) Performed: Procedure(s) (LRB): CYSTOSCOPY WITH RETROGRADE PYELOGRAM, URETEROSCOPY AND STENT PLACEMENT (Bilateral) HOLMIUM LASER APPLICATION (Bilateral)     Patient location during evaluation: PACU Anesthesia Type: General Level of consciousness: awake and alert Pain management: pain level controlled Vital Signs Assessment: post-procedure vital signs reviewed and stable Respiratory status: spontaneous breathing, nonlabored ventilation and respiratory function stable Cardiovascular status: blood pressure returned to baseline and stable Postop Assessment: no apparent nausea or vomiting Anesthetic complications: no    Last Vitals:  Vitals:   06/12/17 1745 06/12/17 1758  BP: (!) 168/66 (!) 164/59  Pulse: 86   Resp: 15   Temp:  36.9 C  SpO2: 98% 98%    Last Pain:  Vitals:   06/12/17 1804  TempSrc:   PainSc: [place]

## 2017-06-13 ENCOUNTER — Encounter (HOSPITAL_COMMUNITY): Payer: Self-pay | Admitting: Urology

## 2017-06-18 DIAGNOSIS — N200 Calculus of kidney: Secondary | ICD-10-CM | POA: Diagnosis not present

## 2017-06-19 DIAGNOSIS — N200 Calculus of kidney: Secondary | ICD-10-CM | POA: Diagnosis not present

## 2017-06-19 DIAGNOSIS — N3001 Acute cystitis with hematuria: Secondary | ICD-10-CM | POA: Diagnosis not present

## 2017-06-26 DIAGNOSIS — N200 Calculus of kidney: Secondary | ICD-10-CM | POA: Diagnosis not present

## 2017-06-30 ENCOUNTER — Other Ambulatory Visit: Payer: Self-pay | Admitting: Cardiovascular Disease

## 2017-07-10 DIAGNOSIS — N200 Calculus of kidney: Secondary | ICD-10-CM | POA: Diagnosis not present

## 2017-07-14 DIAGNOSIS — Z08 Encounter for follow-up examination after completed treatment for malignant neoplasm: Secondary | ICD-10-CM | POA: Diagnosis not present

## 2017-07-14 DIAGNOSIS — D485 Neoplasm of uncertain behavior of skin: Secondary | ICD-10-CM | POA: Diagnosis not present

## 2017-07-14 DIAGNOSIS — Z85828 Personal history of other malignant neoplasm of skin: Secondary | ICD-10-CM | POA: Diagnosis not present

## 2017-07-14 DIAGNOSIS — L570 Actinic keratosis: Secondary | ICD-10-CM | POA: Diagnosis not present

## 2017-07-16 DIAGNOSIS — Z23 Encounter for immunization: Secondary | ICD-10-CM | POA: Diagnosis not present

## 2017-08-13 DIAGNOSIS — Z79899 Other long term (current) drug therapy: Secondary | ICD-10-CM | POA: Diagnosis not present

## 2017-08-13 DIAGNOSIS — D649 Anemia, unspecified: Secondary | ICD-10-CM | POA: Diagnosis not present

## 2017-08-20 DIAGNOSIS — N200 Calculus of kidney: Secondary | ICD-10-CM | POA: Diagnosis not present

## 2017-08-26 DIAGNOSIS — G546 Phantom limb syndrome with pain: Secondary | ICD-10-CM | POA: Diagnosis not present

## 2017-08-26 DIAGNOSIS — M47816 Spondylosis without myelopathy or radiculopathy, lumbar region: Secondary | ICD-10-CM | POA: Diagnosis not present

## 2017-08-26 DIAGNOSIS — I739 Peripheral vascular disease, unspecified: Secondary | ICD-10-CM | POA: Diagnosis not present

## 2017-08-26 DIAGNOSIS — G894 Chronic pain syndrome: Secondary | ICD-10-CM | POA: Diagnosis not present

## 2017-08-26 DIAGNOSIS — M961 Postlaminectomy syndrome, not elsewhere classified: Secondary | ICD-10-CM | POA: Diagnosis not present

## 2017-09-04 DIAGNOSIS — R748 Abnormal levels of other serum enzymes: Secondary | ICD-10-CM | POA: Diagnosis not present

## 2017-09-04 DIAGNOSIS — L299 Pruritus, unspecified: Secondary | ICD-10-CM | POA: Diagnosis not present

## 2017-09-15 ENCOUNTER — Emergency Department (HOSPITAL_COMMUNITY)
Admission: EM | Admit: 2017-09-15 | Discharge: 2017-09-15 | Discharge status: Against Medical Advice | Payer: Medicare Other | Attending: Emergency Medicine | Admitting: Emergency Medicine

## 2017-09-15 ENCOUNTER — Emergency Department (HOSPITAL_COMMUNITY)
Admission: EM | Admit: 2017-09-15 | Discharge: 2017-09-15 | Discharge status: Absent without leave | Payer: Medicare Other | Source: Home / Self Care

## 2017-09-15 ENCOUNTER — Encounter (HOSPITAL_COMMUNITY): Payer: Self-pay | Admitting: Emergency Medicine

## 2017-09-15 ENCOUNTER — Other Ambulatory Visit: Payer: Self-pay

## 2017-09-15 ENCOUNTER — Emergency Department (HOSPITAL_COMMUNITY): Payer: Medicare Other

## 2017-09-15 ENCOUNTER — Ambulatory Visit (HOSPITAL_COMMUNITY): Payer: Medicare Other

## 2017-09-15 DIAGNOSIS — Z79899 Other long term (current) drug therapy: Secondary | ICD-10-CM | POA: Insufficient documentation

## 2017-09-15 DIAGNOSIS — J449 Chronic obstructive pulmonary disease, unspecified: Secondary | ICD-10-CM | POA: Insufficient documentation

## 2017-09-15 DIAGNOSIS — R1084 Generalized abdominal pain: Secondary | ICD-10-CM | POA: Diagnosis not present

## 2017-09-15 DIAGNOSIS — Z87891 Personal history of nicotine dependence: Secondary | ICD-10-CM | POA: Diagnosis not present

## 2017-09-15 DIAGNOSIS — R1110 Vomiting, unspecified: Secondary | ICD-10-CM | POA: Diagnosis not present

## 2017-09-15 DIAGNOSIS — Z794 Long term (current) use of insulin: Secondary | ICD-10-CM | POA: Insufficient documentation

## 2017-09-15 DIAGNOSIS — I5032 Chronic diastolic (congestive) heart failure: Secondary | ICD-10-CM | POA: Insufficient documentation

## 2017-09-15 DIAGNOSIS — I2510 Atherosclerotic heart disease of native coronary artery without angina pectoris: Secondary | ICD-10-CM | POA: Insufficient documentation

## 2017-09-15 DIAGNOSIS — R109 Unspecified abdominal pain: Secondary | ICD-10-CM | POA: Diagnosis present

## 2017-09-15 DIAGNOSIS — Z7982 Long term (current) use of aspirin: Secondary | ICD-10-CM | POA: Insufficient documentation

## 2017-09-15 DIAGNOSIS — I110 Hypertensive heart disease with heart failure: Secondary | ICD-10-CM | POA: Insufficient documentation

## 2017-09-15 DIAGNOSIS — E119 Type 2 diabetes mellitus without complications: Secondary | ICD-10-CM | POA: Diagnosis not present

## 2017-09-15 LAB — URINALYSIS, ROUTINE W REFLEX MICROSCOPIC
Bilirubin Urine: NEGATIVE
GLUCOSE, UA: NEGATIVE mg/dL
Hgb urine dipstick: NEGATIVE
KETONES UR: NEGATIVE mg/dL
LEUKOCYTES UA: NEGATIVE
NITRITE: NEGATIVE
PROTEIN: NEGATIVE mg/dL
Specific Gravity, Urine: 1.01 (ref 1.005–1.030)
pH: 6 (ref 5.0–8.0)

## 2017-09-15 LAB — CBC
HCT: 34.2 % — ABNORMAL LOW (ref 39.0–52.0)
Hemoglobin: 11.6 g/dL — ABNORMAL LOW (ref 13.0–17.0)
MCH: 33.9 pg (ref 26.0–34.0)
MCHC: 33.9 g/dL (ref 30.0–36.0)
MCV: 100 fL (ref 78.0–100.0)
PLATELETS: 319 10*3/uL (ref 150–400)
RBC: 3.42 MIL/uL — ABNORMAL LOW (ref 4.22–5.81)
RDW: 16.7 % — AB (ref 11.5–15.5)
WBC: 9.6 10*3/uL (ref 4.0–10.5)

## 2017-09-15 LAB — COMPREHENSIVE METABOLIC PANEL
ALBUMIN: 3.5 g/dL (ref 3.5–5.0)
ALK PHOS: 157 U/L — AB (ref 38–126)
ALT: 22 U/L (ref 17–63)
ANION GAP: 7 (ref 5–15)
AST: 33 U/L (ref 15–41)
BUN: 8 mg/dL (ref 6–20)
CALCIUM: 9.9 mg/dL (ref 8.9–10.3)
CHLORIDE: 107 mmol/L (ref 101–111)
CO2: 26 mmol/L (ref 22–32)
Creatinine, Ser: 0.98 mg/dL (ref 0.61–1.24)
GFR calc non Af Amer: >60 mL/min (ref >60)
GLUCOSE: 61 mg/dL — AB (ref 65–99)
POTASSIUM: 3.1 mmol/L — AB (ref 3.5–5.1)
SODIUM: 140 mmol/L (ref 135–145)
Total Bilirubin: 0.9 mg/dL (ref 0.3–1.2)
Total Protein: 6.8 g/dL (ref 6.5–8.1)

## 2017-09-15 LAB — TROPONIN I: Troponin I: <0.03 ng/mL (ref <0.03)

## 2017-09-15 LAB — LIPASE, BLOOD: LIPASE: 18 U/L (ref 11–51)

## 2017-09-15 MED ORDER — DEXTROSE 50 % IV SOLN
INTRAVENOUS | Status: AC
  Filled 2017-09-15: qty 50

## 2017-09-15 MED ORDER — ONDANSETRON 4 MG PO TBDP: 4.0000 mg | ORAL_TABLET | Freq: Once | ORAL | Status: DC

## 2017-09-15 MED ORDER — FENTANYL CITRATE (PF) 100 MCG/2ML IJ SOLN: 50.0000 ug | Freq: Once | INTRAMUSCULAR | Status: DC

## 2017-09-15 MED ORDER — ONDANSETRON HCL 4 MG/2ML IJ SOLN
4.0000 mg | Freq: Once | INTRAMUSCULAR | Status: DC
  Filled 2017-09-15: qty 2

## 2017-09-15 MED ORDER — IOPAMIDOL (ISOVUE-300) INJECTION 61%: 100.0000 mL | Freq: Once | INTRAVENOUS | Status: DC | PRN

## 2017-09-15 MED ORDER — DEXTROSE 50 % IV SOLN
1.0000 | Freq: Once | INTRAVENOUS | Status: AC
  Administered 2017-09-15: 50 mL via INTRAVENOUS

## 2017-09-15 MED ORDER — IOPAMIDOL (ISOVUE-300) INJECTION 61%
INTRAVENOUS | Status: AC
  Filled 2017-09-15: qty 100
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
## 2017-09-15 NOTE — ED Notes (Signed)
Pt was observed by charge nurse leaving department without any difficulty via his personal wheelchair with spouse. Pt stated to charge nurse that CT tech was rude. Pt did not exhibit any acute distress while in CT scan or in room.

## 2017-09-15 NOTE — ED Notes (Signed)
Family took CBG and found it was 70. PA made aware.

## 2017-09-15 NOTE — ED Notes (Signed)
ED Provider at bedside. 
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
## 2017-09-15 NOTE — ED Notes (Signed)
Patient transported to CT 

## 2017-09-15 NOTE — ED Notes (Signed)
Attempted to start IV 2x with no success

## 2017-09-15 NOTE — ED Notes (Signed)
Was notified by CT tech that pt IV access had came out before CT scan. IV access attempted in CT scan but unsuccessful. Pt reports that he is not going to allow for any more IV sticks. Santiago Glad PA notified.

## 2017-09-16 ENCOUNTER — Other Ambulatory Visit: Payer: Self-pay | Admitting: Gastroenterology

## 2017-09-16 DIAGNOSIS — R1084 Generalized abdominal pain: Secondary | ICD-10-CM | POA: Diagnosis not present

## 2017-09-16 DIAGNOSIS — R112 Nausea with vomiting, unspecified: Secondary | ICD-10-CM

## 2017-09-16 DIAGNOSIS — R740 Nonspecific elevation of levels of transaminase and lactic acid dehydrogenase [LDH]: Secondary | ICD-10-CM | POA: Diagnosis not present

## 2017-09-18 ENCOUNTER — Other Ambulatory Visit: Payer: Self-pay | Admitting: Gastroenterology

## 2017-09-18 ENCOUNTER — Ambulatory Visit
Admission: RE | Admit: 2017-09-18 | Discharge: 2017-09-18 | Disposition: A | Discharge status: Home / Self Care | Payer: Medicare Other | Source: Ambulatory Visit | Attending: Gastroenterology | Admitting: Gastroenterology

## 2017-09-18 DIAGNOSIS — R112 Nausea with vomiting, unspecified: Secondary | ICD-10-CM

## 2017-09-18 DIAGNOSIS — R109 Unspecified abdominal pain: Secondary | ICD-10-CM

## 2017-09-18 DIAGNOSIS — R1084 Generalized abdominal pain: Secondary | ICD-10-CM

## 2017-09-23 DIAGNOSIS — N200 Calculus of kidney: Secondary | ICD-10-CM | POA: Diagnosis not present

## 2017-10-01 DIAGNOSIS — E1165 Type 2 diabetes mellitus with hyperglycemia: Secondary | ICD-10-CM | POA: Diagnosis not present

## 2017-10-01 DIAGNOSIS — E1151 Type 2 diabetes mellitus with diabetic peripheral angiopathy without gangrene: Secondary | ICD-10-CM | POA: Diagnosis not present

## 2017-10-01 DIAGNOSIS — E113393 Type 2 diabetes mellitus with moderate nonproliferative diabetic retinopathy without macular edema, bilateral: Secondary | ICD-10-CM | POA: Diagnosis not present

## 2017-10-01 DIAGNOSIS — Z89511 Acquired absence of right leg below knee: Secondary | ICD-10-CM | POA: Diagnosis not present

## 2017-10-01 DIAGNOSIS — I739 Peripheral vascular disease, unspecified: Secondary | ICD-10-CM | POA: Diagnosis not present

## 2017-10-01 DIAGNOSIS — Z794 Long term (current) use of insulin: Secondary | ICD-10-CM | POA: Diagnosis not present

## 2017-10-01 DIAGNOSIS — E1142 Type 2 diabetes mellitus with diabetic polyneuropathy: Secondary | ICD-10-CM | POA: Diagnosis not present

## 2017-10-09 DIAGNOSIS — H01021 Squamous blepharitis right upper eyelid: Secondary | ICD-10-CM | POA: Diagnosis not present

## 2017-10-09 DIAGNOSIS — Z961 Presence of intraocular lens: Secondary | ICD-10-CM | POA: Diagnosis not present

## 2017-10-09 DIAGNOSIS — E119 Type 2 diabetes mellitus without complications: Secondary | ICD-10-CM | POA: Diagnosis not present

## 2017-10-09 DIAGNOSIS — H01025 Squamous blepharitis left lower eyelid: Secondary | ICD-10-CM | POA: Diagnosis not present

## 2017-10-09 DIAGNOSIS — H01022 Squamous blepharitis right lower eyelid: Secondary | ICD-10-CM | POA: Diagnosis not present

## 2017-10-09 DIAGNOSIS — H17821 Peripheral opacity of cornea, right eye: Secondary | ICD-10-CM | POA: Diagnosis not present

## 2017-10-09 DIAGNOSIS — H01024 Squamous blepharitis left upper eyelid: Secondary | ICD-10-CM | POA: Diagnosis not present

## 2017-10-14 DIAGNOSIS — K219 Gastro-esophageal reflux disease without esophagitis: Secondary | ICD-10-CM | POA: Diagnosis not present

## 2017-10-14 DIAGNOSIS — K208 Other esophagitis: Secondary | ICD-10-CM | POA: Diagnosis not present

## 2017-10-14 DIAGNOSIS — Z1211 Encounter for screening for malignant neoplasm of colon: Secondary | ICD-10-CM | POA: Diagnosis not present

## 2017-10-20 DIAGNOSIS — Z1211 Encounter for screening for malignant neoplasm of colon: Secondary | ICD-10-CM | POA: Diagnosis not present

## 2017-10-20 DIAGNOSIS — Z1212 Encounter for screening for malignant neoplasm of rectum: Secondary | ICD-10-CM | POA: Diagnosis not present

## 2017-10-21 LAB — COLOGUARD: Cologuard: NEGATIVE

## 2017-10-26 DIAGNOSIS — E1151 Type 2 diabetes mellitus with diabetic peripheral angiopathy without gangrene: Secondary | ICD-10-CM | POA: Diagnosis not present

## 2017-10-26 DIAGNOSIS — M47816 Spondylosis without myelopathy or radiculopathy, lumbar region: Secondary | ICD-10-CM | POA: Diagnosis not present

## 2017-10-26 DIAGNOSIS — M961 Postlaminectomy syndrome, not elsewhere classified: Secondary | ICD-10-CM | POA: Diagnosis not present

## 2017-10-26 DIAGNOSIS — M19019 Primary osteoarthritis, unspecified shoulder: Secondary | ICD-10-CM | POA: Diagnosis not present

## 2017-10-26 DIAGNOSIS — I739 Peripheral vascular disease, unspecified: Secondary | ICD-10-CM | POA: Diagnosis not present

## 2017-10-26 DIAGNOSIS — G894 Chronic pain syndrome: Secondary | ICD-10-CM | POA: Diagnosis not present

## 2017-10-29 ENCOUNTER — Other Ambulatory Visit: Payer: Self-pay

## 2017-10-29 MED ORDER — FUROSEMIDE 40 MG PO TABS: 40.0000 mg | ORAL_TABLET | Freq: Every day | ORAL | 2 refills | Status: DC

## 2017-11-06 DIAGNOSIS — H18832 Recurrent erosion of cornea, left eye: Secondary | ICD-10-CM | POA: Diagnosis not present

## 2017-11-09 DIAGNOSIS — H18832 Recurrent erosion of cornea, left eye: Secondary | ICD-10-CM | POA: Diagnosis not present

## 2017-11-13 DIAGNOSIS — H18832 Recurrent erosion of cornea, left eye: Secondary | ICD-10-CM | POA: Diagnosis not present

## 2017-11-20 DIAGNOSIS — H18832 Recurrent erosion of cornea, left eye: Secondary | ICD-10-CM | POA: Diagnosis not present

## 2017-11-26 DIAGNOSIS — N200 Calculus of kidney: Secondary | ICD-10-CM | POA: Diagnosis not present

## 2017-12-04 DIAGNOSIS — H18832 Recurrent erosion of cornea, left eye: Secondary | ICD-10-CM | POA: Diagnosis not present

## 2017-12-31 DIAGNOSIS — E7800 Pure hypercholesterolemia, unspecified: Secondary | ICD-10-CM | POA: Diagnosis not present

## 2017-12-31 DIAGNOSIS — Z794 Long term (current) use of insulin: Secondary | ICD-10-CM | POA: Diagnosis not present

## 2017-12-31 DIAGNOSIS — E1142 Type 2 diabetes mellitus with diabetic polyneuropathy: Secondary | ICD-10-CM | POA: Diagnosis not present

## 2017-12-31 DIAGNOSIS — I10 Essential (primary) hypertension: Secondary | ICD-10-CM | POA: Diagnosis not present

## 2017-12-31 DIAGNOSIS — E1151 Type 2 diabetes mellitus with diabetic peripheral angiopathy without gangrene: Secondary | ICD-10-CM | POA: Diagnosis not present

## 2018-01-01 MED FILL — HUMULIN R 500 UNITS/ML KWIK: 500 | 12 days supply | Qty: 12 | Fill #0

## 2018-01-04 DIAGNOSIS — H18832 Recurrent erosion of cornea, left eye: Secondary | ICD-10-CM | POA: Diagnosis not present

## 2018-01-05 MED FILL — oxyCODONE HCL 10 MG TABS: 10 | 30 days supply | Qty: 120 | Fill #0

## 2018-01-07 MED FILL — traZODone HCL 100 MG TABS: 100 | 30 days supply | Qty: 30 | Fill #0

## 2018-01-20 DIAGNOSIS — G546 Phantom limb syndrome with pain: Secondary | ICD-10-CM | POA: Diagnosis not present

## 2018-01-20 DIAGNOSIS — E1142 Type 2 diabetes mellitus with diabetic polyneuropathy: Secondary | ICD-10-CM | POA: Diagnosis not present

## 2018-01-20 DIAGNOSIS — I739 Peripheral vascular disease, unspecified: Secondary | ICD-10-CM | POA: Diagnosis not present

## 2018-01-20 DIAGNOSIS — G894 Chronic pain syndrome: Secondary | ICD-10-CM | POA: Diagnosis not present

## 2018-01-20 DIAGNOSIS — M19019 Primary osteoarthritis, unspecified shoulder: Secondary | ICD-10-CM | POA: Diagnosis not present

## 2018-01-20 DIAGNOSIS — M47816 Spondylosis without myelopathy or radiculopathy, lumbar region: Secondary | ICD-10-CM | POA: Diagnosis not present

## 2018-01-27 MED FILL — HUMULIN R 500 UNITS/ML KWIK: 500 | 21 days supply | Qty: 18 | Fill #1

## 2018-02-08 MED FILL — traZODone HCL 100 MG TABS: 100 | 30 days supply | Qty: 30 | Fill #1

## 2018-02-09 MED FILL — LYRICA 100 MG CAPSULE: 100 | 30 days supply | Qty: 90 | Fill #0

## 2018-02-09 MED FILL — oxyCODONE HCL 10 MG TABS: 10 | 30 days supply | Qty: 120 | Fill #0

## 2018-03-01 MED FILL — HUMULIN R 500 UNITS/ML KWIK: 500 | 21 days supply | Qty: 18 | Fill #2

## 2018-03-10 MED FILL — oxyCODONE HCL 10 MG TABS: 10 | 30 days supply | Qty: 120 | Fill #0

## 2018-03-10 MED FILL — LYRICA 100 MG CAPSULE: 100 | 30 days supply | Qty: 90 | Fill #1

## 2018-03-15 MED FILL — traZODone HCL 100 MG TABS: 100 | 30 days supply | Qty: 30 | Fill #0

## 2018-03-16 ENCOUNTER — Encounter: Payer: Self-pay | Admitting: Internal Medicine

## 2018-03-16 ENCOUNTER — Ambulatory Visit (INDEPENDENT_AMBULATORY_CARE_PROVIDER_SITE_OTHER): Payer: 59 | Admitting: Internal Medicine

## 2018-03-16 VITALS — BP 130/58 | HR 91 | Temp 98.8°F | Ht 73.0 in | Wt 274.8 lb

## 2018-03-16 DIAGNOSIS — Z1329 Encounter for screening for other suspected endocrine disorder: Secondary | ICD-10-CM

## 2018-03-16 DIAGNOSIS — T148XXA Other injury of unspecified body region, initial encounter: Secondary | ICD-10-CM | POA: Diagnosis not present

## 2018-03-16 DIAGNOSIS — K209 Esophagitis, unspecified without bleeding: Secondary | ICD-10-CM

## 2018-03-16 DIAGNOSIS — K581 Irritable bowel syndrome with constipation: Secondary | ICD-10-CM

## 2018-03-16 DIAGNOSIS — E11319 Type 2 diabetes mellitus with unspecified diabetic retinopathy without macular edema: Secondary | ICD-10-CM

## 2018-03-16 DIAGNOSIS — K589 Irritable bowel syndrome without diarrhea: Secondary | ICD-10-CM | POA: Insufficient documentation

## 2018-03-16 DIAGNOSIS — E1169 Type 2 diabetes mellitus with other specified complication: Secondary | ICD-10-CM | POA: Diagnosis not present

## 2018-03-16 DIAGNOSIS — K5900 Constipation, unspecified: Secondary | ICD-10-CM

## 2018-03-16 DIAGNOSIS — R790 Abnormal level of blood mineral: Secondary | ICD-10-CM

## 2018-03-16 DIAGNOSIS — Z23 Encounter for immunization: Secondary | ICD-10-CM | POA: Diagnosis not present

## 2018-03-16 DIAGNOSIS — M5137 Other intervertebral disc degeneration, lumbosacral region: Secondary | ICD-10-CM

## 2018-03-16 DIAGNOSIS — M51369 Other intervertebral disc degeneration, lumbar region without mention of lumbar back pain or lower extremity pain: Secondary | ICD-10-CM

## 2018-03-16 DIAGNOSIS — N400 Enlarged prostate without lower urinary tract symptoms: Secondary | ICD-10-CM

## 2018-03-16 DIAGNOSIS — Z125 Encounter for screening for malignant neoplasm of prostate: Secondary | ICD-10-CM

## 2018-03-16 DIAGNOSIS — D649 Anemia, unspecified: Secondary | ICD-10-CM

## 2018-03-16 DIAGNOSIS — E559 Vitamin D deficiency, unspecified: Secondary | ICD-10-CM

## 2018-03-16 DIAGNOSIS — M5136 Other intervertebral disc degeneration, lumbar region: Secondary | ICD-10-CM

## 2018-03-16 DIAGNOSIS — R070 Pain in throat: Secondary | ICD-10-CM

## 2018-03-16 DIAGNOSIS — Z794 Long term (current) use of insulin: Secondary | ICD-10-CM

## 2018-03-16 DIAGNOSIS — E1165 Type 2 diabetes mellitus with hyperglycemia: Secondary | ICD-10-CM

## 2018-03-16 DIAGNOSIS — I10 Essential (primary) hypertension: Secondary | ICD-10-CM

## 2018-03-16 DIAGNOSIS — J449 Chronic obstructive pulmonary disease, unspecified: Secondary | ICD-10-CM | POA: Diagnosis not present

## 2018-03-16 DIAGNOSIS — R030 Elevated blood-pressure reading, without diagnosis of hypertension: Secondary | ICD-10-CM | POA: Diagnosis not present

## 2018-03-16 DIAGNOSIS — G894 Chronic pain syndrome: Secondary | ICD-10-CM

## 2018-03-16 DIAGNOSIS — K219 Gastro-esophageal reflux disease without esophagitis: Secondary | ICD-10-CM

## 2018-03-16 DIAGNOSIS — M51379 Other intervertebral disc degeneration, lumbosacral region without mention of lumbar back pain or lower extremity pain: Secondary | ICD-10-CM

## 2018-03-16 DIAGNOSIS — M79641 Pain in right hand: Secondary | ICD-10-CM

## 2018-03-16 DIAGNOSIS — K449 Diaphragmatic hernia without obstruction or gangrene: Secondary | ICD-10-CM

## 2018-03-16 DIAGNOSIS — M5134 Other intervertebral disc degeneration, thoracic region: Secondary | ICD-10-CM

## 2018-03-16 MED ORDER — OMEPRAZOLE 40 MG PO CPDR: 40.0000 mg | DELAYED_RELEASE_CAPSULE | Freq: Two times a day (BID) | ORAL | 0 refills | Status: DC

## 2018-03-16 MED ORDER — GLUCOSE BLOOD VI STRP: ORAL_STRIP | 12 refills | Status: AC

## 2018-03-16 MED ORDER — MUPIROCIN 2 % EX OINT: 1.0000 "application " | TOPICAL_OINTMENT | Freq: Two times a day (BID) | CUTANEOUS | 0 refills | Status: AC

## 2018-03-16 MED ORDER — LINACLOTIDE 72 MCG PO CAPS: 72.0000 ug | ORAL_CAPSULE | Freq: Every day | ORAL | 3 refills | Status: AC

## 2018-03-16 MED FILL — LINZESS 72 MCG CAPSULE: 72 | 90 days supply | Qty: 90 | Fill #0

## 2018-03-16 MED FILL — OMEPRAZOLE DR 40 MG CAPSULE: 40 | 60 days supply | Qty: 120 | Fill #0

## 2018-03-16 NOTE — Progress Notes (Signed)
Pre visit review using our clinic review tool, if applicable. No additional management support is needed unless otherwise documented below in the visit note. 

## 2018-03-16 NOTE — Patient Instructions (Addendum)
sch fasting labs 04/02/18 and Xray right hand  F/u in 05/2018 with me  I referred you to Dr. Leonidas Romberg (lung)  And  Sturgeon for diabetes  Pneumococcal Polysaccharide Vaccine: What You Need to Know 1. Why get vaccinated? Vaccination can protect older adults (and some children and younger adults) from pneumococcal disease. Pneumococcal disease is caused by bacteria that can spread from person to person through close contact. It can cause ear infections, and it can also lead to more serious infections of the:  Lungs (pneumonia),  Blood (bacteremia), and  Covering of the brain and spinal cord (meningitis). Meningitis can cause deafness and brain damage, and it can be fatal.  Anyone can get pneumococcal disease, but children under 10 years of age, people with certain medical conditions, adults over 55 years of age, and cigarette smokers are at the highest risk. About 18,000 older adults die each year from pneumococcal disease in the [place]. Treatment of pneumococcal infections with penicillin and other drugs used to be more effective. But some strains of the disease have become resistant to these drugs. This makes prevention of the disease, through vaccination, even more important. 2. Pneumococcal polysaccharide vaccine (PPSV23) Pneumococcal polysaccharide vaccine (PPSV23) protects against 23 types of pneumococcal bacteria. It will not prevent all pneumococcal disease. PPSV23 is recommended for:  All adults 44 years of age and older,  Anyone 2 through 65 years of age with certain long-term health problems,  Anyone 2 through 65 years of age with a weakened immune system,  Adults 39 through 65 years of age who smoke cigarettes or have asthma.  Most people need only one dose of PPSV. A second dose is recommended for certain high-risk groups. People 72 and older should get a dose even if they have gotten one or more doses of the vaccine before they turned 65. Your  healthcare provider can give you more information about these recommendations. Most healthy adults develop protection within 2 to 3 weeks of getting the shot. 3. Some people should not get this vaccine  Anyone who has had a life-threatening allergic reaction to PPSV should not get another dose.  Anyone who has a severe allergy to any component of PPSV should not receive it. Tell your provider if you have any severe allergies.  Anyone who is moderately or severely ill when the shot is scheduled may be asked to wait until they recover before getting the vaccine. Someone with a mild illness can usually be vaccinated.  Children less than 24 years of age should not receive this vaccine.  There is no evidence that PPSV is harmful to either a pregnant woman or to her fetus. However, as a precaution, women who need the vaccine should be vaccinated before becoming pregnant, if possible. 4. Risks of a vaccine reaction With any medicine, including vaccines, there is a chance of side effects. These are usually mild and go away on their own, but serious reactions are also possible. About half of people who get PPSV have mild side effects, such as redness or pain where the shot is given, which go away within about two days. Less than 1 out of 100 people develop a fever, muscle aches, or more severe local reactions. Problems that could happen after any vaccine:  People sometimes faint after a medical procedure, including vaccination. Sitting or lying down for about 15 minutes can help prevent fainting, and injuries caused by a fall. Tell your doctor if you feel dizzy, or have vision changes  or ringing in the ears.  Some people get severe pain in the shoulder and have difficulty moving the arm where a shot was given. This happens very rarely.  Any medication can cause a severe allergic reaction. Such reactions from a vaccine are very rare, estimated at about 1 in a million doses, and would happen within a few  minutes to a few hours after the vaccination. As with any medicine, there is a very remote chance of a vaccine causing a serious injury or death. The safety of vaccines is always being monitored. For more information, visit: http://www.aguilar.org/ 5. What if there is a serious reaction? What should I look for? Look for anything that concerns you, such as signs of a severe allergic reaction, very high fever, or unusual behavior. Signs of a severe allergic reaction can include hives, swelling of the face and throat, difficulty breathing, a fast heartbeat, dizziness, and weakness. These would usually start a few minutes to a few hours after the vaccination. What should I do? If you think it is a severe allergic reaction or other emergency that can't wait, call 9-1-1 or get to the nearest hospital. Otherwise, call your doctor. Afterward, the reaction should be reported to the Vaccine Adverse Event Reporting System (VAERS). Your doctor might file this report, or you can do it yourself through the VAERS web site at www.vaers.SamedayNews.es, or by calling 6054521539. VAERS does not give medical advice. 6. How can I learn more?  Ask your doctor. He or she can give you the vaccine package insert or suggest other sources of information.  Call your local or state health department.  Contact the Centers for Disease Control and Prevention (CDC): ? Call 5858775887 (1-800-CDC-INFO) or ? Visit CDC's website at http://hunter.com/ CDC Pneumococcal Polysaccharide Vaccine VIS (01/06/14) This information is not intended to replace advice given to you by your health care provider. Make sure you discuss any questions you have with your health care provider. Document Released: 06/29/2006 Document Revised: 05/22/2016 Document Reviewed: 05/22/2016 Elsevier Interactive Patient Education  2017 [employer]    Fatty Liver Fatty liver, also called hepatic steatosis or steatohepatitis, is a condition in which  too much fat has built up in your liver cells. The liver removes harmful substances from your bloodstream. It produces fluids your body needs. It also helps your body use and store energy from the food you eat. In many cases, fatty liver does not cause symptoms or problems. It is often diagnosed when tests are being done for other reasons. However, over time, fatty liver can cause inflammation that may lead to more serious liver problems, such as scarring of the liver (cirrhosis). What are the causes? Causes of fatty liver may include:  Drinking too much alcohol.  Poor nutrition.  Obesity.  Cushing syndrome.  Diabetes.  Hyperlipidemia.  Pregnancy.  Certain drugs.  Poisons.  Some viral infections.  What increases the risk? You may be more likely to develop fatty liver if you:  Abuse alcohol.  Are pregnant.  Are overweight.  Have diabetes.  Have hepatitis.  Have a high triglyceride level.  What are the signs or symptoms? Fatty liver often does not cause any symptoms. In cases where symptoms develop, they can include:  Fatigue.  Weakness.  Weight loss.  Confusion.  Abdominal pain.  Yellowing of your skin and the white parts of your eyes (jaundice).  Nausea and vomiting.  How is this diagnosed? Fatty liver may be diagnosed by:  Physical exam and medical history.  Blood tests.  Imaging tests, such as an ultrasound, CT scan, or MRI.  Liver biopsy. A small sample of liver tissue is removed using a needle. The sample is then looked at under a microscope.  How is this treated? Fatty liver is often caused by other health conditions. Treatment for fatty liver may involve medicines and lifestyle changes to manage conditions such as:  Alcoholism.  High cholesterol.  Diabetes.  Being overweight or obese.  Follow these instructions at home:  Eat a healthy diet as directed by your health care provider.  Exercise regularly. This can help you lose  weight and control your cholesterol and diabetes. Talk to your health care provider about an exercise plan and which activities are best for you.  Do not drink alcohol.  Take medicines only as directed by your health care provider. Contact a health care provider if: You have difficulty controlling your:  Blood sugar.  Cholesterol.  Alcohol consumption.  Get help right away if:  You have abdominal pain.  You have jaundice.  You have nausea and vomiting. This information is not intended to replace advice given to you by your health care provider. Make sure you discuss any questions you have with your health care provider. Document Released: 10/17/2005 Document Revised: 02/07/2016 Document Reviewed: 01/11/2014 Elsevier Interactive Patient Education  [employer].
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
## 2018-03-17 ENCOUNTER — Other Ambulatory Visit: Payer: Self-pay | Admitting: Internal Medicine

## 2018-03-17 ENCOUNTER — Telehealth: Payer: Self-pay | Admitting: Internal Medicine

## 2018-03-17 DIAGNOSIS — Z794 Long term (current) use of insulin: Principal | ICD-10-CM

## 2018-03-17 DIAGNOSIS — E1169 Type 2 diabetes mellitus with other specified complication: Secondary | ICD-10-CM

## 2018-03-17 MED ORDER — FREESTYLE LITE DEVI: 3 refills | Status: DC

## 2018-03-17 MED ORDER — GLUCOSE BLOOD VI STRP: ORAL_STRIP | 3 refills | Status: AC

## 2018-03-17 MED ORDER — FREESTYLE LANCETS MISC: 3 refills | Status: AC

## 2018-03-17 MED ORDER — FREESTYLE LANCETS MISC: 6 refills | Status: DC

## 2018-03-17 MED ORDER — GLUCOSE BLOOD VI STRP: ORAL_STRIP | 6 refills | Status: DC

## 2018-03-17 MED FILL — FREESTYLE LANCETS: 50 days supply | Qty: 200 | Fill #0

## 2018-03-17 MED FILL — FREESTYLE LITE TEST STRIP: 50 days supply | Qty: 200 | Fill #0

## 2018-03-17 MED FILL — FREESTYLE LITE METER: 30 days supply | Qty: 1 | Fill #0

## 2018-03-17 NOTE — Telephone Encounter (Signed)
Order sent as requested

## 2018-03-17 NOTE — Telephone Encounter (Signed)
Copied from [place] [id number]. Topic: Quick Communication - See Telephone Encounter >> Mar 17, 2018 10:56 AM Percell Belt A wrote: CRM for notification. See Telephone encounter for: 03/17/18. [facility] called in and stated that ins will only cover the Free style lite test strips, meter and lantus  Please send to pharmacy [phone]

## 2018-03-22 ENCOUNTER — Encounter: Payer: Self-pay | Admitting: Internal Medicine

## 2018-03-22 DIAGNOSIS — N400 Enlarged prostate without lower urinary tract symptoms: Secondary | ICD-10-CM | POA: Insufficient documentation

## 2018-03-22 DIAGNOSIS — E119 Type 2 diabetes mellitus without complications: Secondary | ICD-10-CM | POA: Insufficient documentation

## 2018-03-22 DIAGNOSIS — K5900 Constipation, unspecified: Secondary | ICD-10-CM | POA: Insufficient documentation

## 2018-03-25 ENCOUNTER — Telehealth: Payer: Self-pay

## 2018-03-25 ENCOUNTER — Encounter: Payer: Self-pay | Admitting: Pulmonary Disease

## 2018-03-25 ENCOUNTER — Ambulatory Visit (INDEPENDENT_AMBULATORY_CARE_PROVIDER_SITE_OTHER): Payer: 59 | Admitting: Pulmonary Disease

## 2018-03-25 VITALS — BP 136/66 | HR 69 | Resp 16 | Ht 73.0 in | Wt 272.0 lb

## 2018-03-25 DIAGNOSIS — J449 Chronic obstructive pulmonary disease, unspecified: Secondary | ICD-10-CM | POA: Diagnosis not present

## 2018-03-25 DIAGNOSIS — R0609 Other forms of dyspnea: Secondary | ICD-10-CM | POA: Diagnosis not present

## 2018-03-25 DIAGNOSIS — J8410 Pulmonary fibrosis, unspecified: Secondary | ICD-10-CM

## 2018-03-25 MED ORDER — UMECLIDINIUM-VILANTEROL 62.5-25 MCG/INH IN AEPB: 1.0000 | INHALATION_SPRAY | Freq: Every day | RESPIRATORY_TRACT | 5 refills | Status: DC
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
## 2018-03-25 NOTE — Telephone Encounter (Signed)
Copied from [place] [id number]. Topic: General - Other >> Mar 25, 2018  9:34 AM Yvette Rack wrote: Reason for CRM: Jerline Pain from [place] [phone] calling to verify that  provider Mclean-Scocuzza wanted the U/S of the neck but doesn't want Thyroid image please respond pt has appt tomorrow

## 2018-03-25 NOTE — Patient Instructions (Addendum)
Anoro inhaler - one inhalation daily.  Between now and follow-up visit, you may try on and off this inhaler to determine whether it provides any improvement in your shortness of breath  Lung function tests and chest x-ray ordered to be performed prior to follow-up in 6 weeks

## 2018-03-25 NOTE — Telephone Encounter (Signed)
They have been notified neck soft tissue only

## 2018-03-25 NOTE — Telephone Encounter (Signed)
US neck soft tissue is what I would lik e  [place]

## 2018-03-25 NOTE — Telephone Encounter (Signed)
Please advise 

## 2018-03-26 ENCOUNTER — Ambulatory Visit
Admission: RE | Admit: 2018-03-26 | Discharge: 2018-03-26 | Disposition: A | Discharge status: Home / Self Care | Payer: 59 | Source: Ambulatory Visit | Attending: Internal Medicine | Admitting: Internal Medicine

## 2018-03-26 DIAGNOSIS — R070 Pain in throat: Secondary | ICD-10-CM | POA: Diagnosis not present

## 2018-03-26 DIAGNOSIS — J029 Acute pharyngitis, unspecified: Secondary | ICD-10-CM | POA: Diagnosis not present

## 2018-03-30 ENCOUNTER — Telehealth: Payer: Self-pay

## 2018-03-30 NOTE — Telephone Encounter (Signed)
Copied from [place]. Topic: Inquiry >> Mar 30, 2018  9:45 AM Mylinda Latina, NT wrote: Reason for CRM: Sharyn Lull calling from [facility] states that they faxed over a LMM / medical necessity. She states that she received the RX but she did not receive the LMM form or the OV . Please send information FAX# [phone]

## 2018-03-31 NOTE — Telephone Encounter (Signed)
Please advise 

## 2018-04-02 ENCOUNTER — Other Ambulatory Visit (INDEPENDENT_AMBULATORY_CARE_PROVIDER_SITE_OTHER): Payer: 59

## 2018-04-02 DIAGNOSIS — D649 Anemia, unspecified: Secondary | ICD-10-CM

## 2018-04-02 DIAGNOSIS — R030 Elevated blood-pressure reading, without diagnosis of hypertension: Secondary | ICD-10-CM

## 2018-04-02 DIAGNOSIS — E559 Vitamin D deficiency, unspecified: Secondary | ICD-10-CM

## 2018-04-02 DIAGNOSIS — Z794 Long term (current) use of insulin: Secondary | ICD-10-CM | POA: Diagnosis not present

## 2018-04-02 DIAGNOSIS — E1169 Type 2 diabetes mellitus with other specified complication: Secondary | ICD-10-CM

## 2018-04-02 DIAGNOSIS — R790 Abnormal level of blood mineral: Secondary | ICD-10-CM | POA: Diagnosis not present

## 2018-04-02 LAB — CBC WITH DIFFERENTIAL/PLATELET
BASOS PCT: 0.8 % (ref 0.0–3.0)
Basophils Absolute: 0 10*3/uL (ref 0.0–0.1)
EOS PCT: 2.3 % (ref 0.0–5.0)
Eosinophils Absolute: 0.1 10*3/uL (ref 0.0–0.7)
HCT: 36.7 % — ABNORMAL LOW (ref 39.0–52.0)
HEMOGLOBIN: 12.6 g/dL — AB (ref 13.0–17.0)
LYMPHS ABS: 2.9 10*3/uL (ref 0.7–4.0)
Lymphocytes Relative: 60.7 % — ABNORMAL HIGH (ref 12.0–46.0)
MCHC: 34.4 g/dL (ref 30.0–36.0)
MCV: 100.9 fl — AB (ref 78.0–100.0)
MONO ABS: 0.4 10*3/uL (ref 0.1–1.0)
MONOS PCT: 8.2 % (ref 3.0–12.0)
NEUTROS PCT: 28 % — AB (ref 43.0–77.0)
Neutro Abs: 1.3 10*3/uL — ABNORMAL LOW (ref 1.4–7.7)
Platelets: 268 10*3/uL (ref 150.0–400.0)
RBC: 3.64 Mil/uL — ABNORMAL LOW (ref 4.22–5.81)
RDW: 15.7 % — AB (ref 11.5–15.5)
WBC: 4.7 10*3/uL (ref 4.0–10.5)

## 2018-04-02 LAB — COMPREHENSIVE METABOLIC PANEL
ALBUMIN: 3.9 g/dL (ref 3.5–5.2)
ALK PHOS: 153 U/L — AB (ref 39–117)
ALT: 16 U/L (ref 0–53)
AST: 15 U/L (ref 0–37)
BUN: 10 mg/dL (ref 6–23)
CHLORIDE: 103 meq/L (ref 96–112)
CO2: 26 mEq/L (ref 19–32)
Calcium: 9.7 mg/dL (ref 8.4–10.5)
Creatinine, Ser: 1 mg/dL (ref 0.40–1.50)
GFR: 79.67 mL/min (ref >60.00)
Glucose, Bld: 340 mg/dL — ABNORMAL HIGH (ref 70–99)
POTASSIUM: 3.8 meq/L (ref 3.5–5.1)
Sodium: 138 mEq/L (ref 135–145)
Total Bilirubin: 0.8 mg/dL (ref 0.2–1.2)
Total Protein: 6.7 g/dL (ref 6.0–8.3)

## 2018-04-02 LAB — LIPID PANEL
CHOLESTEROL: 114 mg/dL (ref 0–200)
HDL: 21.3 mg/dL — AB (ref >39.00)
NONHDL: 92.86
TRIGLYCERIDES: 223 mg/dL — AB (ref 0.0–149.0)
Total CHOL/HDL Ratio: 5
VLDL: 44.6 mg/dL — ABNORMAL HIGH (ref 0.0–40.0)

## 2018-04-02 LAB — VITAMIN D 25 HYDROXY (VIT D DEFICIENCY, FRACTURES): VITD: 25.06 ng/mL — AB (ref 30.00–100.00)

## 2018-04-02 LAB — IRON,TIBC AND FERRITIN PANEL
%SAT: 29 % (ref 20–48)
FERRITIN: 38 ng/mL (ref 24–380)
Iron: 101 ug/dL (ref 50–180)
TIBC: 348 ug/dL (ref 250–425)

## 2018-04-02 LAB — LDL CHOLESTEROL, DIRECT: LDL DIRECT: 76 mg/dL

## 2018-04-02 LAB — MAGNESIUM: Magnesium: 2.1 mg/dL (ref 1.5–2.5)

## 2018-04-02 LAB — HEMOGLOBIN A1C: Hgb A1c MFr Bld: 7.8 % — ABNORMAL HIGH (ref 4.6–6.5)

## 2018-04-02 MED FILL — ANORO ELLIPTA 62.5-25 MCG I: 62.5-25 | 30 days supply | Qty: 60 | Fill #0

## 2018-04-02 MED FILL — HUMULIN R 500 UNITS/ML KWIK: 500 | 49 days supply | Qty: 42 | Fill #3

## 2018-04-02 NOTE — Telephone Encounter (Signed)
Fax Monday papers in box

## 2018-04-05 DIAGNOSIS — E782 Mixed hyperlipidemia: Secondary | ICD-10-CM | POA: Diagnosis not present

## 2018-04-05 DIAGNOSIS — E559 Vitamin D deficiency, unspecified: Secondary | ICD-10-CM | POA: Diagnosis not present

## 2018-04-05 DIAGNOSIS — E1140 Type 2 diabetes mellitus with diabetic neuropathy, unspecified: Secondary | ICD-10-CM | POA: Diagnosis not present

## 2018-04-05 DIAGNOSIS — I10 Essential (primary) hypertension: Secondary | ICD-10-CM | POA: Diagnosis not present

## 2018-04-05 DIAGNOSIS — E1165 Type 2 diabetes mellitus with hyperglycemia: Secondary | ICD-10-CM | POA: Diagnosis not present

## 2018-04-05 DIAGNOSIS — Z794 Long term (current) use of insulin: Secondary | ICD-10-CM | POA: Diagnosis not present

## 2018-04-05 NOTE — Telephone Encounter (Signed)
[name] called and stated that's she has not received the LMN or office notes. CB#[phone]

## 2018-04-06 NOTE — Telephone Encounter (Signed)
re faxed paper work in [employer] with OV notes , notified Michelle at [facility] clinic we did not receive the LMN.

## 2018-04-08 ENCOUNTER — Telehealth: Payer: Self-pay | Admitting: Internal Medicine

## 2018-04-08 MED FILL — LYRICA 100 MG CAPSULE: 100 | 30 days supply | Qty: 90 | Fill #2

## 2018-04-08 MED FILL — traZODone HCL 100 MG TABS: 100 | 30 days supply | Qty: 30 | Fill #1

## 2018-04-08 NOTE — Telephone Encounter (Signed)
Saw [facility] 04/05/18 rec check cbg qid not medication changes per NP Kristine Linea   [place]

## 2018-04-14 MED FILL — oxyCODONE HCL 10 MG TABS: 10 | 30 days supply | Qty: 120 | Fill #0

## 2018-04-21 DIAGNOSIS — E1165 Type 2 diabetes mellitus with hyperglycemia: Secondary | ICD-10-CM | POA: Diagnosis not present

## 2018-04-21 DIAGNOSIS — E782 Mixed hyperlipidemia: Secondary | ICD-10-CM | POA: Diagnosis not present

## 2018-04-21 DIAGNOSIS — Z794 Long term (current) use of insulin: Secondary | ICD-10-CM | POA: Diagnosis not present

## 2018-04-21 DIAGNOSIS — E559 Vitamin D deficiency, unspecified: Secondary | ICD-10-CM | POA: Diagnosis not present

## 2018-04-21 DIAGNOSIS — I10 Essential (primary) hypertension: Secondary | ICD-10-CM | POA: Diagnosis not present

## 2018-04-26 DIAGNOSIS — F1190 Opioid use, unspecified, uncomplicated: Secondary | ICD-10-CM | POA: Diagnosis not present

## 2018-04-26 DIAGNOSIS — Z79891 Long term (current) use of opiate analgesic: Secondary | ICD-10-CM | POA: Diagnosis not present

## 2018-04-26 DIAGNOSIS — Z5181 Encounter for therapeutic drug level monitoring: Secondary | ICD-10-CM | POA: Diagnosis not present

## 2018-04-26 DIAGNOSIS — M5386 Other specified dorsopathies, lumbar region: Secondary | ICD-10-CM | POA: Diagnosis not present

## 2018-04-26 DIAGNOSIS — M47816 Spondylosis without myelopathy or radiculopathy, lumbar region: Secondary | ICD-10-CM | POA: Diagnosis not present

## 2018-04-26 DIAGNOSIS — Z79899 Other long term (current) drug therapy: Secondary | ICD-10-CM | POA: Diagnosis not present

## 2018-04-26 DIAGNOSIS — G894 Chronic pain syndrome: Secondary | ICD-10-CM | POA: Diagnosis not present

## 2018-04-26 MED FILL — VIT D2 1.25 MG (50,000 UNIT: 1.25 MG | 28 days supply | Qty: 4 | Fill #0

## 2018-04-27 DIAGNOSIS — Z89511 Acquired absence of right leg below knee: Secondary | ICD-10-CM | POA: Diagnosis not present

## 2018-04-27 MED FILL — ANORO ELLIPTA 62.5-25 MCG I: 62.5-25 | 30 days supply | Qty: 60 | Fill #1

## 2018-04-29 ENCOUNTER — Ambulatory Visit
Admission: RE | Admit: 2018-04-29 | Discharge: 2018-04-29 | Disposition: A | Discharge status: Home / Self Care | Payer: 59 | Source: Ambulatory Visit | Attending: Pulmonary Disease | Admitting: Pulmonary Disease

## 2018-04-29 ENCOUNTER — Ambulatory Visit (HOSPITAL_COMMUNITY): Payer: 59

## 2018-04-29 DIAGNOSIS — R0602 Shortness of breath: Secondary | ICD-10-CM | POA: Diagnosis not present

## 2018-04-29 DIAGNOSIS — J9811 Atelectasis: Secondary | ICD-10-CM | POA: Insufficient documentation

## 2018-04-29 DIAGNOSIS — J449 Chronic obstructive pulmonary disease, unspecified: Secondary | ICD-10-CM | POA: Insufficient documentation

## 2018-04-29 MED ORDER — ALBUTEROL SULFATE (2.5 MG/3ML) 0.083% IN NEBU
2.5000 mg | INHALATION_SOLUTION | Freq: Once | RESPIRATORY_TRACT | Status: AC
  Administered 2018-04-29: 2.5 mg via RESPIRATORY_TRACT
  Filled 2018-04-29: qty 3

## 2018-05-06 ENCOUNTER — Ambulatory Visit (INDEPENDENT_AMBULATORY_CARE_PROVIDER_SITE_OTHER): Payer: 59 | Admitting: Pulmonary Disease

## 2018-05-06 ENCOUNTER — Encounter: Payer: Self-pay | Admitting: Pulmonary Disease

## 2018-05-06 VITALS — BP 152/70 | HR 68 | Ht 73.0 in | Wt 272.0 lb

## 2018-05-06 DIAGNOSIS — R0609 Other forms of dyspnea: Secondary | ICD-10-CM | POA: Diagnosis not present

## 2018-05-06 DIAGNOSIS — E668 Other obesity: Secondary | ICD-10-CM

## 2018-05-06 DIAGNOSIS — E785 Hyperlipidemia, unspecified: Secondary | ICD-10-CM | POA: Diagnosis not present

## 2018-05-06 DIAGNOSIS — E1159 Type 2 diabetes mellitus with other circulatory complications: Secondary | ICD-10-CM | POA: Diagnosis not present

## 2018-05-06 DIAGNOSIS — R600 Localized edema: Secondary | ICD-10-CM | POA: Diagnosis not present

## 2018-05-06 DIAGNOSIS — I10 Essential (primary) hypertension: Secondary | ICD-10-CM | POA: Diagnosis not present

## 2018-05-06 DIAGNOSIS — Z87891 Personal history of nicotine dependence: Secondary | ICD-10-CM

## 2018-05-06 DIAGNOSIS — Z794 Long term (current) use of insulin: Secondary | ICD-10-CM | POA: Diagnosis not present

## 2018-05-06 DIAGNOSIS — J449 Chronic obstructive pulmonary disease, unspecified: Secondary | ICD-10-CM | POA: Diagnosis not present

## 2018-05-06 DIAGNOSIS — R942 Abnormal results of pulmonary function studies: Secondary | ICD-10-CM

## 2018-05-06 DIAGNOSIS — E1165 Type 2 diabetes mellitus with hyperglycemia: Secondary | ICD-10-CM | POA: Diagnosis not present

## 2018-05-06 DIAGNOSIS — R5381 Other malaise: Secondary | ICD-10-CM

## 2018-05-06 DIAGNOSIS — E1169 Type 2 diabetes mellitus with other specified complication: Secondary | ICD-10-CM | POA: Diagnosis not present

## 2018-05-06 MED ORDER — TIOTROPIUM BROMIDE-OLODATEROL 2.5-2.5 MCG/ACT IN AERS: 2.0000 | INHALATION_SPRAY | Freq: Every day | RESPIRATORY_TRACT | 10 refills | Status: DC

## 2018-05-06 MED FILL — STIOLTO RESPIMAT INHAL SPRY: 2.5-2.5 | 30 days supply | Qty: 4 | Fill #0
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
## 2018-05-06 NOTE — Patient Instructions (Signed)
Change Anoro inhaler to [employer].  Sample provided and prescription placed.  Used 2 actuations (puffs) once a day  Overnight oximetry ordered.  We will contact you with results of this and initiate oxygen therapy if indicated  Follow-up in 3 to 4 months or sooner as needed

## 2018-05-10 MED FILL — PREGABALIN 100 MG CAPS: 100 | 30 days supply | Qty: 90 | Fill #3

## 2018-05-10 MED FILL — traZODone HCL 100 MG TABS: 100 | 30 days supply | Qty: 30 | Fill #2

## 2018-05-21 ENCOUNTER — Ambulatory Visit (INDEPENDENT_AMBULATORY_CARE_PROVIDER_SITE_OTHER): Payer: 59 | Admitting: Internal Medicine

## 2018-05-21 ENCOUNTER — Encounter: Payer: Self-pay | Admitting: Internal Medicine

## 2018-05-21 VITALS — BP 130/64 | HR 81 | Temp 98.6°F | Ht 71.0 in | Wt 272.0 lb

## 2018-05-21 DIAGNOSIS — R609 Edema, unspecified: Secondary | ICD-10-CM | POA: Diagnosis not present

## 2018-05-21 DIAGNOSIS — Z23 Encounter for immunization: Secondary | ICD-10-CM

## 2018-05-21 DIAGNOSIS — R490 Dysphonia: Secondary | ICD-10-CM | POA: Diagnosis not present

## 2018-05-21 DIAGNOSIS — G4700 Insomnia, unspecified: Secondary | ICD-10-CM

## 2018-05-21 DIAGNOSIS — E1165 Type 2 diabetes mellitus with hyperglycemia: Secondary | ICD-10-CM | POA: Diagnosis not present

## 2018-05-21 DIAGNOSIS — R110 Nausea: Secondary | ICD-10-CM | POA: Diagnosis not present

## 2018-05-21 DIAGNOSIS — E785 Hyperlipidemia, unspecified: Secondary | ICD-10-CM | POA: Insufficient documentation

## 2018-05-21 DIAGNOSIS — J449 Chronic obstructive pulmonary disease, unspecified: Secondary | ICD-10-CM

## 2018-05-21 MED ORDER — PROMETHAZINE HCL 25 MG PO TABS: 25.0000 mg | ORAL_TABLET | Freq: Three times a day (TID) | ORAL | 1 refills | Status: DC | PRN

## 2018-05-21 MED ORDER — FUROSEMIDE 40 MG PO TABS: 40.0000 mg | ORAL_TABLET | Freq: Every day | ORAL | 3 refills | Status: DC

## 2018-05-21 MED ORDER — ONDANSETRON 4 MG PO TBDP: 4.0000 mg | ORAL_TABLET | Freq: Two times a day (BID) | ORAL | 1 refills | Status: AC | PRN

## 2018-05-21 MED ORDER — ATORVASTATIN CALCIUM 10 MG PO TABS: 10.0000 mg | ORAL_TABLET | Freq: Every day | ORAL | 3 refills | Status: AC

## 2018-05-21 MED ORDER — TRAZODONE HCL 100 MG PO TABS: 100.0000 mg | ORAL_TABLET | Freq: Every day | ORAL | 3 refills | Status: DC

## 2018-05-21 MED ORDER — PROMETHAZINE HCL 25 MG PO TABS: 25.0000 mg | ORAL_TABLET | Freq: Three times a day (TID) | ORAL | 1 refills | Status: AC | PRN

## 2018-05-21 MED FILL — ONDANSETRON ODT 4 MG TABLET: 4 | 30 days supply | Qty: 60 | Fill #0

## 2018-05-21 MED FILL — ATORVASTATIN 10 MG TABLET: 10 | 90 days supply | Qty: 90 | Fill #0

## 2018-05-21 MED FILL — FUROSEMIDE 40 MG TAB: 40 | 90 days supply | Qty: 90 | Fill #0

## 2018-05-21 MED FILL — PROMETHAZINE 25 MG TABLET: 25 | 30 days supply | Qty: 90 | Fill #0
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
## 2018-05-21 NOTE — Progress Notes (Signed)
Pre visit review using our clinic review tool, if applicable. No additional management support is needed unless otherwise documented below in the visit note. 

## 2018-05-21 NOTE — Patient Instructions (Addendum)
Vitamin D3 5000 IU daily  rec cetaphil or cerave cream for your skin  Consider dermatology consult to look at your arm  F/u in 4 months sooner if needed  We gave you the flu shot today   Actinic Keratosis An actinic keratosis is a precancerous growth on the skin. This means that it could develop into skin cancer if it is not treated. About 1% of these growths (actinic keratoses) turn into skin cancer within one year if they are not treated. It is important to have all of these growths evaluated to determine the best treatment approach. What are the causes? This condition is caused by getting too much ultraviolet (UV) radiation from the sun or other UV light sources. What increases the risk? The following factors may make you more likely to develop this condition:  Having light-colored skin and blue eyes.  Having blonde or red hair.  Spending a lot of time in the sun.  Inadequate skin protection when outdoors. This may include: ? Not using sunscreen properly. ? Not covering up skin that is exposed to sunlight.  Aging. The risk of developing an actinic keratosis increases with age.  What are the signs or symptoms? Actinic keratoses look like scaly, rough spots of skin.They can be as small as a pinhead or as big as a quarter. They may itch, hurt, or feel sensitive. In most cases, the growths become red. In some cases, they may be skin-colored, light tan, dark tan, pink, or a combination of any of these colors. There may be a small piece of pink or gray skin (skin tag) growing from the actinic keratosis. In some cases, it may be easier to notice actinic keratoses by feeling them, rather than seeing them. Actinic keratoses appear most often on areas of skin that get a lot of sun exposure, including the scalp, face, ears, lips, upper back, forearms, and the backs of the hands. Sometimes, actinic keratoses disappear, but many reappear a few days to a few weeks later. How is this  diagnosed? This condition is usually diagnosed with a physical exam. A tissue sample may be removed from the actinic keratosis and examined under a microscope (biopsy). How is this treated?  Treatment for this condition may include:  Scraping off the actinic keratosis (curettage).  Freezing the actinic keratosis with liquid nitrogen (cryosurgery). This causes the growth to eventually fall off the skin.  Applying medicated creams or gels to destroy the cells in the growth.  Applying chemicals to the actinic keratosis to make the outer layers of skin peel off (chemical peel).  Photodynamic therapy. In this procedure, medicated cream is applied to the actinic keratosis. This cream increases your skin's sensitivity to light. Then, a strong light is aimed at the actinic keratosis to destroy cells in the growth.  Follow these instructions at home: Skin care  Apply cool, wet cloths (cool compresses) to the affected areas.  Do not scratch your skin.  Check your skin regularly for any growths, especially growths that: ? Start to itch or bleed. ? Change in size, shape, or color. Caring for the treated area  Keep the treated area clean and dry as told by your health care provider.  Do not apply any medicine, cream, or lotion to the treated area unless your health care provider tells you to do that.  Do not pick at blisters or try to break them open. This can cause infection and scarring.  If you have red or irritated skin after treatment,  follow instructions from your health care provider about how to take care of the treated area. Make sure you: ? Wash your hands with soap and water before you change your bandage (dressing). If soap and water are not available, use hand sanitizer. ? Change your dressing as told by your health care provider.  If you have red or irritated skin after treatment, check your treated area every day for signs of infection. Check for: ? Swelling, pain, or more  redness. ? Fluid or blood. ? Warmth. ? Pus or a bad smell. General instructions  Take over-the-counter and prescription medicines only as told by your health care provider.  Return to your normal activities as told by your health care provider. Ask your health care provider what activities are safe for you.  Do not use any tobacco products, such as cigarettes, chewing tobacco, and e-cigarettes. If you need help quitting, ask your health care provider.  Have a skin exam done every year by a health care provider who is a skin conditions specialist (dermatologist).  Keep all follow-up visits as told by your health care provider. This is important. How is this prevented?  Do not get sunburns.  Try to avoid the sun between 10:00 a.m. and 4:00 p.m. This is when the UV light is the strongest.  Use a sunscreen or sunblock with SPF 30 (sun protection factor 30) or greater.  Apply sunscreen before you are exposed to sunlight, and reapply periodically as often as directed by the instructions on the sunscreen container.  Always wear sunglasses that have UV protection, and always wear hats and clothing to protect your skin from sunlight.  When possible, avoid medicines that increase your sensitivity to sunlight. These include: ? Certain antibiotic medicines. ? Certain water pills (diuretics). ? Certain prescription medicines that are used to treat acne (retinoids).  Do not use tanning beds or other indoor tanning devices. Contact a health care provider if:  You notice any changes or new growths on your skin.  You have swelling, pain, or more redness around your treated area.  You have fluid or blood coming from your treated area.  Your treated area feels warm to the touch.  You have pus or a bad smell coming from your treated area.  You have a fever.  You have a blister that becomes large and painful. This information is not intended to replace advice given to you by your health  care provider. Make sure you discuss any questions you have with your health care provider. Document Released: 11/28/2008 Document Revised: 05/02/2016 Document Reviewed: 05/12/2015 Elsevier Interactive Patient Education  2018 [employer].   Hypoglycemia Hypoglycemia occurs when the level of sugar (glucose) in the blood is too low. Glucose is a type of sugar that provides the body's main source of energy. Certain hormones (insulin and glucagon) control the level of glucose in the blood. Insulin lowers blood glucose, and glucagon increases blood glucose. Hypoglycemia can result from having too much insulin in the bloodstream, or from not eating enough food that contains glucose. Hypoglycemia can happen in people who do or do not have diabetes. It can develop quickly, and it can be a medical emergency. What are the causes? Hypoglycemia occurs most often in people who have diabetes. If you have diabetes, hypoglycemia may be caused by:  Diabetes medicine.  Not eating enough, or not eating often enough.  Increased physical activity.  Drinking alcohol, especially when you have not eaten recently.  If you do not have  diabetes, hypoglycemia may be caused by:  A tumor in the pancreas. The pancreas is the organ that makes insulin.  Not eating enough, or not eating for long periods at a time (fasting).  Severe infection or illness that affects the liver, heart, or kidneys.  Certain medicines.  You may also have reactive hypoglycemia. This condition causes hypoglycemia within 4 hours of eating a meal. This may occur after having stomach surgery. Sometimes, the cause of reactive hypoglycemia is not known. What increases the risk? Hypoglycemia is more likely to develop in:  People who have diabetes and take medicines to lower blood glucose.  People who abuse alcohol.  People who have a severe illness.  What are the signs or symptoms? Hypoglycemia may not cause any symptoms. If you have  symptoms, they may include:  Hunger.  Anxiety.  Sweating and feeling clammy.  Confusion.  Dizziness or feeling light-headed.  Sleepiness.  Nausea.  Increased heart rate.  Headache.  Blurry vision.  Seizure.  Nightmares.  Tingling or numbness around the mouth, lips, or tongue.  A change in speech.  Decreased ability to concentrate.  A change in coordination.  Restless sleep.  Tremors or shakes.  Fainting.  Irritability.  How is this diagnosed? Hypoglycemia is diagnosed with a blood test to measure your blood glucose level. This blood test is done while you are having symptoms. Your health care provider may also do a physical exam and review your medical history. If you do not have diabetes, other tests may be done to find the cause of your hypoglycemia. How is this treated? This condition can often be treated by immediately eating or drinking something that contains glucose, such as:  3-4 sugar tablets (glucose pills).  Glucose gel, 15-gram tube.  Fruit juice, 4 oz (120 mL).  Regular soda (not diet soda), 4 oz (120 mL).  Low-fat milk, 4 oz (120 mL).  Several pieces of hard candy.  Sugar or honey, 1 Tbsp.  Treating Hypoglycemia If You Have Diabetes  If you are alert and able to swallow safely, follow the 15:15 rule:  Take 15 grams of a rapid-acting carbohydrate. Rapid-acting options include: ? 1 tube of glucose gel. ? 3 glucose pills. ? 6-8 pieces of hard candy. ? 4 oz (120 mL) of fruit juice. ? 4 oz (120 ml) of regular (not diet) soda.  Check your blood glucose 15 minutes after you take the carbohydrate.  If the repeat blood glucose level is still at or below 70 mg/dL (3.9 mmol/L), take 15 grams of a carbohydrate again.  If your blood glucose level does not increase above 70 mg/dL (3.9 mmol/L) after 3 tries, seek emergency medical care.  After your blood glucose level returns to normal, eat a meal or a snack within 1 hour.  Treating  Severe Hypoglycemia Severe hypoglycemia is when your blood glucose level is at or below 54 mg/dL (3 mmol/L). Severe hypoglycemia is an emergency. Do not wait to see if the symptoms will go away. Get medical help right away. Call your local emergency services (911 in the [place] Do not drive yourself to the hospital. If you have severe hypoglycemia and you cannot eat or drink, you may need an injection of glucagon. A family member or close friend should learn how to check your blood glucose and how to give you a glucagon injection. Ask your health care provider if you need to have an emergency glucagon injection kit available. Severe hypoglycemia may need to be treated in a  hospital. The treatment may include getting glucose through an IV tube. You may also need treatment for the cause of your hypoglycemia. Follow these instructions at home: General instructions  Avoid any diets that cause you to not eat enough food. Talk with your health care provider before you start any new diet.  Take over-the-counter and prescription medicines only as told by your health care provider.  Limit alcohol intake to no more than 1 drink per day for nonpregnant women and 2 drinks per day for men. One drink equals 12 oz of beer, 5 oz of wine, or 1 oz of hard liquor.  Keep all follow-up visits as told by your health care provider. This is important. If You Have Diabetes:   Make sure you know the symptoms of hypoglycemia.  Always have a rapid-acting carbohydrate snack with you to treat low blood sugar.  Follow your diabetes management plan, as told by your health care provider. Make sure you: ? Take your medicines as directed. ? Follow your exercise plan. ? Follow your meal plan. Eat on time, and do not skip meals. ? Check your blood glucose as often as directed. Make sure to check your blood glucose before and after exercise. If you exercise longer or in a different way than usual, check your blood glucose more  often. ? Follow your sick day plan whenever you cannot eat or drink normally. Make this plan in advance with your health care provider.  Share your diabetes management plan with people in your workplace, school, and household.  Check your urine for ketones when you are ill and as told by your health care provider.  Carry a medical alert card or wear medical alert jewelry. If You Have Reactive Hypoglycemia or Low Blood Sugar From Other Causes:  Monitor your blood glucose as told by your health care provider.  Follow instructions from your health care provider about eating or drinking restrictions. Contact a health care provider if:  You have problems keeping your blood glucose in your target range.  You have frequent episodes of hypoglycemia. Get help right away if:  You continue to have hypoglycemia symptoms after eating or drinking something containing glucose.  Your blood glucose is at or below 54 mg/dL (3 mmol/L).  You have a seizure.  You faint. These symptoms may represent a serious problem that is an emergency. Do not wait to see if the symptoms will go away. Get medical help right away. Call your local emergency services (911 in the [place] Do not drive yourself to the hospital. This information is not intended to replace advice given to you by your health care provider. Make sure you discuss any questions you have with your health care provider. Document Released: 09/01/2005 Document Revised: 02/13/2016 Document Reviewed: 10/05/2015 Elsevier Interactive Patient Education  2018 [employer].    Cholesterol Cholesterol is a white, waxy, fat-like substance that is needed by the human body in small amounts. The liver makes all the cholesterol we need. Cholesterol is carried from the liver by the blood through the blood vessels. Deposits of cholesterol (plaques) may build up on blood vessel (artery) walls. Plaques make the arteries narrower and stiffer. Cholesterol plaques  increase the risk for heart attack and stroke. You cannot feel your cholesterol level even if it is very high. The only way to know that it is high is to have a blood test. Once you know your cholesterol levels, you should keep a record of the test results. Work with your  health care provider to keep your levels in the desired range. What do the results mean?  Total cholesterol is a rough measure of all the cholesterol in your blood.  LDL (low-density lipoprotein) is the "bad" cholesterol. This is the type that causes plaque to build up on the artery walls. You want this level to be low.  HDL (high-density lipoprotein) is the "good" cholesterol because it cleans the arteries and carries the LDL away. You want this level to be high.  Triglycerides are fat that the body can either burn for energy or store. High levels are closely linked to heart disease. What are the desired levels of cholesterol?  Total cholesterol below 200.  LDL below 100 for people who are at risk, below 70 for people at very high risk.  HDL above 40 is good. A level of 60 or higher is considered to be protective against heart disease.  Triglycerides below 150. How can I lower my cholesterol? Diet Follow your diet program as told by your health care provider.  Choose fish or white meat chicken and [place], roasted or baked. Limit fatty cuts of red meat, fried foods, and processed meats, such as sausage and lunch meats.  Eat lots of fresh fruits and vegetables.  Choose whole grains, beans, pasta, potatoes, and cereals.  Choose olive oil, corn oil, or canola oil, and use only small amounts.  Avoid butter, mayonnaise, shortening, or palm kernel oils.  Avoid foods with trans fats.  Drink skim or nonfat milk and eat low-fat or nonfat yogurt and cheeses. Avoid whole milk, cream, ice cream, egg yolks, and full-fat cheeses.  Healthier desserts include angel food cake, ginger snaps, animal crackers, hard candy,  popsicles, and low-fat or nonfat frozen yogurt. Avoid pastries, cakes, pies, and cookies.  Exercise  Follow your exercise program as told by your health care provider. A regular program: ? Helps to decrease LDL and raise HDL. ? Helps with weight control.  Do things that increase your activity level, such as gardening, walking, and taking the stairs.  Ask your health care provider about ways that you can be more active in your daily life.  Medicine  Take over-the-counter and prescription medicines only as told by your health care provider. ? Medicine may be prescribed by your health care provider to help lower cholesterol and decrease the risk for heart disease. This is usually done if diet and exercise have failed to bring down cholesterol levels. ? If you have several risk factors, you may need medicine even if your levels are normal.  This information is not intended to replace advice given to you by your health care provider. Make sure you discuss any questions you have with your health care provider. Document Released: 05/27/2001 Document Revised: 03/29/2016 Document Reviewed: 03/01/2016 Elsevier Interactive Patient Education  [employer].

## 2018-05-25 ENCOUNTER — Ambulatory Visit: Payer: 59 | Attending: Anesthesiology | Admitting: Anesthesiology

## 2018-05-25 ENCOUNTER — Other Ambulatory Visit: Payer: Self-pay

## 2018-05-25 ENCOUNTER — Encounter: Payer: Self-pay | Admitting: Anesthesiology

## 2018-05-25 VITALS — BP 186/57 | HR 90 | Temp 98.9°F | Resp 20 | Ht 73.0 in | Wt 270.0 lb

## 2018-05-25 DIAGNOSIS — I110 Hypertensive heart disease with heart failure: Secondary | ICD-10-CM | POA: Diagnosis not present

## 2018-05-25 DIAGNOSIS — I6529 Occlusion and stenosis of unspecified carotid artery: Secondary | ICD-10-CM | POA: Diagnosis not present

## 2018-05-25 DIAGNOSIS — G5600 Carpal tunnel syndrome, unspecified upper limb: Secondary | ICD-10-CM | POA: Diagnosis not present

## 2018-05-25 DIAGNOSIS — I5032 Chronic diastolic (congestive) heart failure: Secondary | ICD-10-CM | POA: Insufficient documentation

## 2018-05-25 DIAGNOSIS — M5136 Other intervertebral disc degeneration, lumbar region: Secondary | ICD-10-CM | POA: Insufficient documentation

## 2018-05-25 DIAGNOSIS — Z79891 Long term (current) use of opiate analgesic: Secondary | ICD-10-CM | POA: Diagnosis not present

## 2018-05-25 DIAGNOSIS — M5134 Other intervertebral disc degeneration, thoracic region: Secondary | ICD-10-CM | POA: Insufficient documentation

## 2018-05-25 DIAGNOSIS — F329 Major depressive disorder, single episode, unspecified: Secondary | ICD-10-CM | POA: Insufficient documentation

## 2018-05-25 DIAGNOSIS — Z85828 Personal history of other malignant neoplasm of skin: Secondary | ICD-10-CM | POA: Insufficient documentation

## 2018-05-25 DIAGNOSIS — Z87891 Personal history of nicotine dependence: Secondary | ICD-10-CM | POA: Diagnosis not present

## 2018-05-25 DIAGNOSIS — K3184 Gastroparesis: Secondary | ICD-10-CM | POA: Diagnosis not present

## 2018-05-25 DIAGNOSIS — Z79899 Other long term (current) drug therapy: Secondary | ICD-10-CM | POA: Insufficient documentation

## 2018-05-25 DIAGNOSIS — M542 Cervicalgia: Secondary | ICD-10-CM | POA: Insufficient documentation

## 2018-05-25 DIAGNOSIS — K760 Fatty (change of) liver, not elsewhere classified: Secondary | ICD-10-CM | POA: Diagnosis not present

## 2018-05-25 DIAGNOSIS — F419 Anxiety disorder, unspecified: Secondary | ICD-10-CM | POA: Insufficient documentation

## 2018-05-25 DIAGNOSIS — G894 Chronic pain syndrome: Secondary | ICD-10-CM | POA: Diagnosis not present

## 2018-05-25 DIAGNOSIS — F1190 Opioid use, unspecified, uncomplicated: Secondary | ICD-10-CM | POA: Diagnosis not present

## 2018-05-25 DIAGNOSIS — K219 Gastro-esophageal reflux disease without esophagitis: Secondary | ICD-10-CM | POA: Insufficient documentation

## 2018-05-25 DIAGNOSIS — E1151 Type 2 diabetes mellitus with diabetic peripheral angiopathy without gangrene: Secondary | ICD-10-CM | POA: Diagnosis not present

## 2018-05-25 DIAGNOSIS — E785 Hyperlipidemia, unspecified: Secondary | ICD-10-CM | POA: Diagnosis not present

## 2018-05-25 DIAGNOSIS — I2510 Atherosclerotic heart disease of native coronary artery without angina pectoris: Secondary | ICD-10-CM | POA: Insufficient documentation

## 2018-05-25 DIAGNOSIS — R531 Weakness: Secondary | ICD-10-CM | POA: Diagnosis not present

## 2018-05-25 DIAGNOSIS — J449 Chronic obstructive pulmonary disease, unspecified: Secondary | ICD-10-CM | POA: Insufficient documentation

## 2018-05-25 DIAGNOSIS — K449 Diaphragmatic hernia without obstruction or gangrene: Secondary | ICD-10-CM | POA: Insufficient documentation

## 2018-05-25 DIAGNOSIS — E213 Hyperparathyroidism, unspecified: Secondary | ICD-10-CM | POA: Diagnosis not present

## 2018-05-25 DIAGNOSIS — M961 Postlaminectomy syndrome, not elsewhere classified: Secondary | ICD-10-CM | POA: Diagnosis not present

## 2018-05-25 DIAGNOSIS — E1143 Type 2 diabetes mellitus with diabetic autonomic (poly)neuropathy: Secondary | ICD-10-CM | POA: Diagnosis not present

## 2018-05-25 DIAGNOSIS — Z794 Long term (current) use of insulin: Secondary | ICD-10-CM | POA: Diagnosis not present

## 2018-05-25 MED ORDER — OXYCODONE-ACETAMINOPHEN 10-325 MG PO TABS: 1.0000 | ORAL_TABLET | Freq: Three times a day (TID) | ORAL | 0 refills | Status: DC | PRN

## 2018-05-25 NOTE — Patient Instructions (Signed)
Oxycodone prescription given.

## 2018-05-25 NOTE — Addendum Note (Signed)
Addended by: Morley Kos on: 05/25/2018 02:40 PM   Modules accepted: Orders

## 2018-05-25 NOTE — Addendum Note (Signed)
Addended by: Molli Barrows on: 05/25/2018 02:55 PM   Modules accepted: Orders
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
## 2018-05-25 NOTE — Addendum Note (Signed)
Addended by: Morley Kos on: 05/25/2018 02:37 PM   Modules accepted: Orders

## 2018-05-25 NOTE — Progress Notes (Signed)
Safety precautions to be maintained throughout the outpatient stay will include: orient to surroundings, keep bed in low position, maintain call bell within reach at all times, provide assistance with transfer out of bed and ambulation.  

## 2018-05-26 ENCOUNTER — Encounter: Payer: Self-pay | Admitting: Internal Medicine

## 2018-05-26 MED FILL — OXYCODONE-ACETAMINOPHEN 10-: 10-325 | 30 days supply | Qty: 90 | Fill #0

## 2018-05-26 NOTE — Progress Notes (Signed)
Note has been faxed to Dr solum 

## 2018-05-27 ENCOUNTER — Encounter: Payer: Self-pay | Admitting: Internal Medicine

## 2018-05-27 ENCOUNTER — Ambulatory Visit (INDEPENDENT_AMBULATORY_CARE_PROVIDER_SITE_OTHER): Payer: 59 | Admitting: Internal Medicine

## 2018-05-27 VITALS — BP 168/70 | HR 94 | Temp 98.3°F | Ht 73.0 in | Wt 275.4 lb

## 2018-05-27 DIAGNOSIS — G629 Polyneuropathy, unspecified: Secondary | ICD-10-CM | POA: Diagnosis not present

## 2018-05-27 DIAGNOSIS — S90862D Insect bite (nonvenomous), left foot, subsequent encounter: Secondary | ICD-10-CM | POA: Diagnosis not present

## 2018-05-27 DIAGNOSIS — W57XXXD Bitten or stung by nonvenomous insect and other nonvenomous arthropods, subsequent encounter: Secondary | ICD-10-CM | POA: Diagnosis not present

## 2018-05-27 DIAGNOSIS — R030 Elevated blood-pressure reading, without diagnosis of hypertension: Secondary | ICD-10-CM | POA: Diagnosis not present

## 2018-05-27 DIAGNOSIS — B353 Tinea pedis: Secondary | ICD-10-CM

## 2018-05-27 MED ORDER — DOXYCYCLINE HYCLATE 100 MG PO TABS: 100.0000 mg | ORAL_TABLET | Freq: Two times a day (BID) | ORAL | 0 refills | Status: DC

## 2018-05-27 MED FILL — DOXYCYCLINE HYCLATE 100 MG: 100 | 10 days supply | Qty: 20 | Fill #0

## 2018-05-27 NOTE — Patient Instructions (Addendum)
Try Clotrimazole, Lamisil, Lotriman or Ketaconazole  Consider increase lyrica to 150 3x per day disc with pain doctor  F/u as sch let me know in 2 weeks how your foot is doing  bactroban use up to 3x per day  Neuropathic Pain Neuropathic pain is pain caused by damage to the nerves that are responsible for certain sensations in your body (sensory nerves). The pain can be caused by damage to:  The sensory nerves that send signals to your spinal cord and brain (peripheral nervous system).  The sensory nerves in your brain or spinal cord (central nervous system).  Neuropathic pain can make you more sensitive to pain. What would be a minor sensation for most people may feel very painful if you have neuropathic pain. This is usually a long-term condition that can be difficult to treat. The type of pain can differ from person to person. It may start suddenly (acute), or it may develop slowly and last for a long time (chronic). Neuropathic pain may come and go as damaged nerves heal or may stay at the same level for years. It often causes emotional distress, loss of sleep, and a lower quality of life. What are the causes? The most common cause of damage to a sensory nerve is diabetes. Many other diseases and conditions can also cause neuropathic pain. Causes of neuropathic pain can be classified as:  Toxic. Many drugs and chemicals can cause toxic damage. The most common cause of toxic neuropathic pain is damage from drug treatment for cancer (chemotherapy).  Metabolic. This type of pain can happen when a disease causes imbalances that damage nerves. Diabetes is the most common of these diseases. Vitamin B deficiency caused by long-term alcohol abuse is another common cause.  Traumatic. Any injury that cuts, crushes, or stretches a nerve can cause damage and pain. A common example is feeling pain after losing an arm or leg (phantom limb pain).  Compression-related. If a sensory nerve gets trapped or  compressed for a long period of time, the blood supply to the nerve can be cut off.  Vascular. Many blood vessel diseases can cause neuropathic pain by decreasing blood supply and oxygen to nerves.  Autoimmune. This type of pain results from diseases in which the body's defense system mistakenly attacks sensory nerves. Examples of autoimmune diseases that can cause neuropathic pain include lupus and multiple sclerosis.  Infectious. Many types of viral infections can damage sensory nerves and cause pain. Shingles infection is a common cause of this type of pain.  Inherited. Neuropathic pain can be a symptom of many diseases that are passed down through families (genetic).  What are the signs or symptoms? The main symptom is pain. Neuropathic pain is often described as:  Burning.  Shock-like.  Stinging.  Hot or cold.  Itching.  How is this diagnosed? No single test can diagnose neuropathic pain. Your health care provider will do a physical exam and ask you about your pain. You may use a pain scale to describe how bad your pain is. You may also have tests to see if you have a high sensitivity to pain and to help find the cause and location of any sensory nerve damage. These tests may include:  Imaging studies, such as: ? X-rays. ? CT scan. ? MRI.  Nerve conduction studies to test how well nerve signals travel through your sensory nerves (electrodiagnostic testing).  Stimulating your sensory nerves through electrodes on your skin and measuring the response in your spinal cord  and brain (somatosensory evoked potentials).  How is this treated? Treatment for neuropathic pain may change over time. You may need to try different treatment options or a combination of treatments. Some options include:  Over-the-counter pain relievers.  Prescription medicines. Some medicines used to treat other conditions may also help neuropathic pain. These include medicines to: ? Control seizures  (anticonvulsants). ? Relieve depression (antidepressants).  Prescription-strength pain relievers (narcotics). These are usually used when other pain relievers do not help.  Transcutaneous nerve stimulation (TENS). This uses electrical currents to block painful nerve signals. The treatment is painless.  Topical and local anesthetics. These are medicines that numb the nerves. They can be injected as a nerve block or applied to the skin.  Alternative treatments, such as: ? Acupuncture. ? Meditation. ? Massage. ? Physical therapy. ? Pain management programs. ? Counseling.  Follow these instructions at home:  Learn as much as you can about your condition.  Take medicines only as directed by your health care provider.  Work closely with all your health care providers to find what works best for you.  Have a good support system at home.  Consider joining a chronic pain support group. Contact a health care provider if:  Your pain treatments are not helping.  You are having side effects from your medicines.  You are struggling with fatigue, mood changes, depression, or anxiety. This information is not intended to replace advice given to you by your health care provider. Make sure you discuss any questions you have with your health care provider. Document Released: 05/29/2004 Document Revised: 03/21/2016 Document Reviewed: 02/09/2014 Elsevier Interactive Patient Education  2018 [place] Athlete's foot (tinea pedis) is a fungal infection of the skin on the feet. It often occurs on the skin that is between or underneath the toes. It can also occur on the soles of the feet. The infection can spread from person to person (is contagious). What are the causes? Athlete's foot is caused by a fungus. This fungus grows in warm, moist places. Most people get athlete's foot by sharing shower stalls, towels, and wet floors with someone who is infected. Not washing your  feet or changing your socks often enough can contribute to athlete's foot. What increases the risk? This condition is more likely to develop in:  Men.  People who have a weak body defense system (immune system).  People who have diabetes.  People who use public showers, such as at a gym.  People who wear heavy-duty shoes, such as Environmental manager.  Seasons with warm, humid weather.  What are the signs or symptoms? Symptoms of this condition include:  Itchy areas between the toes or on the soles of the feet.  White, flaky, or scaly areas between the toes or on the soles of the feet.  Very itchy small blisters between the toes or on the soles of the feet.  Small cuts on the skin. These cuts can become infected.  Thick or discolored toenails.  How is this diagnosed? This condition is diagnosed with a medical history and physical exam. Your health care provider may also take a skin or toenail sample to be examined. How is this treated? Treatment for this condition includes antifungal medicines. These may be applied as powders, ointments, or creams. In severe cases, an oral antifungal medicine may be given. Follow these instructions at home:  Apply or take over-the-counter and prescription medicines only as told by your health care  provider.  Keep all follow-up visits as told by your health care provider. This is important.  Do not scratch your feet.  Keep your feet dry: ? Wear cotton or wool socks. Change your socks every day or if they become wet. ? Wear shoes that allow air to circulate, such as sandals or canvas tennis shoes.  Wash and dry your feet: ? Every day or as told by your health care provider. ? After exercising. ? Including the area between your toes.  Do not share towels, nail clippers, or other personal items that touch your feet with others.  If you have diabetes, keep your blood sugar under control. How is this prevented?  Do not share  towels.  Wear sandals in wet areas, such as locker rooms and shared showers.  Keep your feet dry: ? Wear cotton or wool socks. Change your socks every day or if they become wet. ? Wear shoes that allow air to circulate, such as sandals or canvas tennis shoes.  Wash and dry your feet after exercising. Pay attention to the area between your toes. Contact a health care provider if:  You have a fever.  You have swelling, soreness, warmth, or redness in your foot.  You are not getting better with treatment.  Your symptoms get worse.  You have new symptoms. This information is not intended to replace advice given to you by your health care provider. Make sure you discuss any questions you have with your health care provider. Document Released: 08/29/2000 Document Revised: 02/07/2016 Document Reviewed: 03/05/2015 Elsevier Interactive Patient Education  2018 [place], [place] Ticks are insects that can bite. Most ticks live in shrubs and grassy areas. They climb onto people and animals that go by. Then they bite. Some ticks carry germs that can make you sick. How can I prevent tick bites?  Use an insect repellent that has 20% or higher of the ingredients DEET, picaridin, or IR3535. Put this insect repellent on: ? Bare skin. ? The tops of your boots. ? Your pant legs. ? The ends of your sleeves.  If you use an insect repellent that has the ingredient permethrin, make sure to follow the instructions on the bottle. Treat the following: ? Clothing. ? Supplies. ? Boots. ? Tents.  Wear long sleeves, long pants, and light colors.  Tuck your pant legs into your socks.  Stay in the middle of the trail.  Try not to walk through long grass.  Before going inside your house, check your clothes, hair, and skin for ticks. Make sure to check your head, neck, armpits, waist, groin, and joint areas.  Check for ticks every day.  When you come indoors: ? Wash your  clothes right away. ? Shower right away. ? Dry your clothes in a dryer on high heat for 60 minutes or more. What is the right way to remove a tick? Remove a tick from your skin as soon as possible.  To remove a tick that is crawling on your skin: ? Go outdoors and brush the tick off. ? Use tape or a lint roller.  To remove a tick that is biting: ? Wash your hands. ? If you have latex gloves, put them on. ? Use tweezers, curved forceps, or a tick-removal tool to grasp the tick. Grasp the tick as close to your skin and as close to the tick's head as possible. ? Gently pull up until the tick lets go.  Try to keep the  tick's head attached to its body.  Do not twist or jerk the tick.  Do not squeeze or crush the tick.  Do not try to remove a tick with heat, alcohol, petroleum jelly, or fingernail polish. How should I get rid of a tick? Here are some ways to get rid of a tick that is alive:  Place the tick in rubbing alcohol.  Place the tick in a bag or container you can close tightly.  Wrap the tick tightly in tape.  Flush the tick down the toilet.  Contact a doctor if:  You have symptoms of a disease, such as: ? Pain in a muscle, joint, or bone. ? Trouble walking or moving your legs. ? Numbness in your legs. ? Inability to move (paralysis). ? A red rash that makes a circle (bull's-eye rash). ? Redness and swelling where the tick bit you. ? A fever. ? Throwing up (vomiting) over and over. ? Diarrhea. ? Weight loss. ? Tender and swollen lymph glands. ? Shortness of breath. ? Cough. ? Belly pain (abdominal pain). ? Headache. ? Being more tired than normal. ? A change in how alert (conscious) you are. ? Confusion. Get help right away if:  You cannot remove a tick.  A part of a tick breaks off and gets stuck in your skin.  You are feeling worse. Summary  Ticks may carry germs that can make you sick.  To prevent tick bites, wear long sleeves, long pants, and  light colors. Use insect repellent. Follow the instructions on the bottle.  If the tick is biting, do not try to remove it with heat, alcohol, petroleum jelly, or fingernail polish.  Use tweezers, curved forceps, or a tick-removal tool to grasp the tick. Gently pull up until the tick lets go. Do not twist or jerk the tick. Do not squeeze or crush the tick.  If you have symptoms, contact a doctor. This information is not intended to replace advice given to you by your health care provider. Make sure you discuss any questions you have with your health care provider. Document Released: 11/26/2009 Document Revised: 12/12/2016 Document Reviewed: 12/12/2016 Elsevier Interactive Patient Education  2018 [employer].
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
## 2018-05-29 LAB — TOXASSURE SELECT 13 (MW), URINE

## 2018-06-07 MED FILL — traZODone HCL 100 MG TABS: 100 | 30 days supply | Qty: 30 | Fill #3

## 2018-06-07 MED FILL — PREGABALIN 100 MG CAPS: 100 | 30 days supply | Qty: 90 | Fill #4

## 2018-06-10 DIAGNOSIS — I10 Essential (primary) hypertension: Secondary | ICD-10-CM | POA: Diagnosis not present

## 2018-06-10 DIAGNOSIS — E1169 Type 2 diabetes mellitus with other specified complication: Secondary | ICD-10-CM | POA: Diagnosis not present

## 2018-06-10 DIAGNOSIS — E1165 Type 2 diabetes mellitus with hyperglycemia: Secondary | ICD-10-CM | POA: Diagnosis not present

## 2018-06-10 DIAGNOSIS — E1159 Type 2 diabetes mellitus with other circulatory complications: Secondary | ICD-10-CM | POA: Diagnosis not present

## 2018-06-10 DIAGNOSIS — Z794 Long term (current) use of insulin: Secondary | ICD-10-CM | POA: Diagnosis not present

## 2018-06-10 DIAGNOSIS — E785 Hyperlipidemia, unspecified: Secondary | ICD-10-CM | POA: Diagnosis not present

## 2018-06-16 MED FILL — HUMULIN R 500 UNITS/ML KWIK: 500 | 28 days supply | Qty: 24 | Fill #0

## 2018-06-23 ENCOUNTER — Other Ambulatory Visit: Payer: Self-pay

## 2018-06-23 ENCOUNTER — Ambulatory Visit: Payer: 59 | Attending: Anesthesiology | Admitting: Anesthesiology

## 2018-06-23 ENCOUNTER — Encounter: Payer: Self-pay | Admitting: Anesthesiology

## 2018-06-23 VITALS — BP 162/62 | HR 79 | Temp 98.3°F | Resp 18 | Ht 73.0 in | Wt 262.0 lb

## 2018-06-23 DIAGNOSIS — G894 Chronic pain syndrome: Secondary | ICD-10-CM | POA: Insufficient documentation

## 2018-06-23 DIAGNOSIS — M5489 Other dorsalgia: Secondary | ICD-10-CM | POA: Diagnosis present

## 2018-06-23 DIAGNOSIS — Z794 Long term (current) use of insulin: Secondary | ICD-10-CM | POA: Insufficient documentation

## 2018-06-23 DIAGNOSIS — M79606 Pain in leg, unspecified: Secondary | ICD-10-CM | POA: Diagnosis not present

## 2018-06-23 DIAGNOSIS — M961 Postlaminectomy syndrome, not elsewhere classified: Secondary | ICD-10-CM | POA: Diagnosis not present

## 2018-06-23 DIAGNOSIS — Z79899 Other long term (current) drug therapy: Secondary | ICD-10-CM | POA: Insufficient documentation

## 2018-06-23 DIAGNOSIS — M5134 Other intervertebral disc degeneration, thoracic region: Secondary | ICD-10-CM | POA: Diagnosis not present

## 2018-06-23 DIAGNOSIS — R531 Weakness: Secondary | ICD-10-CM | POA: Diagnosis not present

## 2018-06-23 DIAGNOSIS — Z79891 Long term (current) use of opiate analgesic: Secondary | ICD-10-CM | POA: Diagnosis not present

## 2018-06-23 DIAGNOSIS — M542 Cervicalgia: Secondary | ICD-10-CM | POA: Insufficient documentation

## 2018-06-23 DIAGNOSIS — F1190 Opioid use, unspecified, uncomplicated: Secondary | ICD-10-CM | POA: Diagnosis not present

## 2018-06-23 DIAGNOSIS — Z7982 Long term (current) use of aspirin: Secondary | ICD-10-CM | POA: Diagnosis not present

## 2018-06-23 MED ORDER — PREGABALIN 100 MG PO CAPS: 100.0000 mg | ORAL_CAPSULE | Freq: Three times a day (TID) | ORAL | 2 refills | Status: DC

## 2018-06-23 MED ORDER — OXYCODONE-ACETAMINOPHEN 10-325 MG PO TABS: 1.0000 | ORAL_TABLET | Freq: Three times a day (TID) | ORAL | 0 refills | Status: DC | PRN

## 2018-06-23 NOTE — Patient Instructions (Signed)
Paper script for Lyrica (with 2 refills) and one for oxycodone TLU 07/25/2018 handed to patient prior to discharge.

## 2018-06-23 NOTE — Progress Notes (Signed)
Subjective:  Patient ID: [name], male    DOB: 1950/12/03  Age: 67 y.o. MRN: [id number]  CC: Back Pain (low mid and upper)   Procedure: None  HPI [name] presents for reevaluation.  He was last seen a month ago and presents today for reevaluation.  The quality characteristic distribution of his low back pain and lower extremity pain are stable in nature.  No new changes are reported today.  His strength and bowel and bladder function have been stable as well.  The quality is stable.  He is taking his medications including the oxycodone without difficulty.  I have reviewed the narcotic assessment sheet and based on these findings he is deriving good functional lifestyle improvement and pain relief with his medications and no side effects are reported.  He is also taking pregabalin and this has been given to him by his previous provider.  He is out of this at this time.  This was helping with his low back and stump pain.  Outpatient Medications Prior to Visit  Medication Sig Dispense Refill  . acetaminophen (TYLENOL) 500 MG tablet Take 500-1,000 mg by mouth every 6 (six) hours as needed for headache.     Marland Kitchen aspirin EC 81 MG tablet Take 81 mg by mouth every morning. Reported on 12/05/2015 1 tablet 0  . atorvastatin (LIPITOR) 10 MG tablet Take 1 tablet (10 mg total) by mouth daily at 6 PM. 90 tablet 3  . diphenhydrAMINE (BENADRYL) 25 mg capsule Take 25 mg by mouth at bedtime as needed for sleep.    Marland Kitchen DOCUSATE SODIUM PO Take 240 mg by mouth daily.     Marland Kitchen doxycycline (VIBRA-TABS) 100 MG tablet Take 1 tablet (100 mg total) by mouth 2 (two) times daily. With food 20 tablet 0  . furosemide (LASIX) 40 MG tablet Take 1 tablet (40 mg total) by mouth daily. 90 tablet 3  . glucose blood (FREESTYLE LITE) test strip Use as instructed 400 each 3  . glucose blood test strip Check qid Use as instructed 400 each 12  . HUMULIN R U-500 KWIKPEN 500 UNIT/ML kwikpen Inject 0-125 Units into the skin  4 (four) times daily -  with meals and at bedtime. Sliding scale: (u500 insulin) Breakfast: 70-90: 51u, 91-130: 85u, 131-150: 90u, 151-200: 95u, 201-250: 100u, 251-300: 105u, 301-350: 110u, 351-400: 115u, 401-450: 120u, >450: 125u Lunch:  70-90: 45u, 91-130: 75u, 131-150: 80u, 151-200: 85u, 201-250: 90u, 251-300: 95u, 301-350: 100u, 351-400: 105u, 401-450: 110u, >450: 115u Supper: 70-90: 42u, 91-130: 70u, 131-150: 75u, 151-200: 80u, 201-250: 85u, 251-300: 90u, 301-350: 95u, 351-400: 100u, 401-450: 105u, >450: 110u Nighttime:  70-90: 0u, 91-130: 0u, 131-150: 0u, 151-200: 0u, 201-250: 0u, 251-300: 1u, 301-350: 2u, 351-400: 3u, 401-450: 4u, >450: 5u  11  . Lancets (FREESTYLE) lancets Qid check glucoseUse as instructed 400 each 3  . levocetirizine (XYZAL) 5 MG tablet Take 5 mg by mouth every evening.    . linaclotide (LINZESS) 72 MCG capsule Take 1 capsule (72 mcg total) by mouth daily before breakfast. 90 capsule 3  . LORazepam (ATIVAN) 0.5 MG tablet Take 0.5 mg by mouth 3 (three) times daily as needed for anxiety.    . mupirocin ointment (BACTROBAN) 2 % Apply 1 application topically 2 (two) times daily. 30 g 0  . nystatin (MYCOSTATIN) 100000 UNIT/ML suspension Take 5 mLs by mouth 4 (four) times daily as needed. For mouth sores.    . nystatin (MYCOSTATIN/NYSTOP) powder Apply 1 application topically  2 (two) times daily as needed. For yeast around skin folds    . omeprazole (PRILOSEC) 40 MG capsule Take 1 capsule (40 mg total) by mouth 2 (two) times daily. 30 min before food 120 capsule 0  . ondansetron (ZOFRAN-ODT) 4 MG disintegrating tablet Take 1 tablet (4 mg total) by mouth 2 (two) times daily as needed for nausea or vomiting. 60 tablet 1  . promethazine (PHENERGAN) 25 MG tablet Take 1 tablet (25 mg total) by mouth every 8 (eight) hours as needed for nausea or vomiting. 90 tablet 1  . tamsulosin (FLOMAX) 0.4 MG CAPS capsule Take 1 capsule (0.4 mg total) by mouth daily after supper. (Patient taking  differently: Take 0.4 mg by mouth at bedtime. ) 90 capsule 1  . Tiotropium Bromide-Olodaterol (STIOLTO RESPIMAT) 2.5-2.5 MCG/ACT AERS Inhale 2 puffs into the lungs daily. 4 g 10  . traZODone (DESYREL) 100 MG tablet Take 1 tablet (100 mg total) by mouth at bedtime. 90 tablet 3  . Vitamin D, Ergocalciferol, (DRISDOL) 50000 units CAPS capsule Take 50,000 Units by mouth every 7 (seven) days.  0  . LYRICA 100 MG capsule TK1 CAPSULE BY MOUTH THREE TIMES DAILY (Patient taking differently: TAKE 1 CAPSULE BY MOUTH THREE TIMES DAILY) 90 capsule 0   No facility-administered medications prior to visit.     Review of Systems CNS: No confusion or sedation Cardiac: No angina or palpitations GI: No abdominal pain or constipation Constitutional: No nausea vomiting fevers or chills  Objective:  BP (!) 162/62   Pulse 79   Temp 98.3 F (36.8 C) (Oral)   Resp 18   Ht 6\' 1"  (1.854 m)   Wt 262 lb (118.8 kg)   SpO2 99%   BMI 34.57 kg/m    BP Readings from Last 3 Encounters:  06/23/18 (!) 162/62  05/27/18 (!) 168/70  05/25/18 (!) 186/57     Wt Readings from Last 3 Encounters:  06/23/18 262 lb (118.8 kg)  05/27/18 275 lb 6.4 oz (124.9 kg)  05/25/18 270 lb (122.5 kg)     Physical Exam Pt is alert and oriented PERRL EOMI HEART IS RRR no murmur or rub LCTA no wheezing or rales MUSCULOSKELETAL reveals diffuse lower back pain.  He is in a wheelchair.  His muscle tone and bulk appears to be stable as compared to baseline.  Labs  Lab Results  Component Value Date   HGBA1C 7.8 (H) 04/02/2018   HGBA1C 7.3 (H) 12/06/2015   HGBA1C 10.4 04/30/2015   Lab Results  Component Value Date   MICROALBUR 8.1 07/16/2015   LDLCALC 50 12/06/2015   CREATININE 1.00 04/02/2018    -------------------------------------------------------------------------------------------------------------------- Lab Results  Component Value Date   WBC 4.7 04/02/2018   HGB 12.6 (L) 04/02/2018   HCT 36.7 (L) 04/02/2018    PLT 268.0 04/02/2018   GLUCOSE 340 (H) 04/02/2018   CHOL 114 04/02/2018   TRIG 223.0 (H) 04/02/2018   HDL 21.30 (L) 04/02/2018   LDLDIRECT 76.0 04/02/2018   LDLCALC 50 12/06/2015   ALT 16 04/02/2018   AST 15 04/02/2018   NA 138 04/02/2018   K 3.8 04/02/2018   CL 103 04/02/2018   CREATININE 1.00 04/02/2018   BUN 10 04/02/2018   CO2 26 04/02/2018   TSH 1.22 03/25/2012   PSA 0.40 03/25/2012   INR 1.01 01/28/2016   HGBA1C 7.8 (H) 04/02/2018   MICROALBUR 8.1 07/16/2015    --------------------------------------------------------------------------------------------------------------------- Dg Chest 2 View  Result Date: 04/29/2018 CLINICAL DATA:  Shortness of  breath EXAM: CHEST - 2 VIEW COMPARISON:  Chest radiograph 04/22/2016 FINDINGS: Streaky opacity at the medial right chest with elevation of the right hilum. No pleural effusion or pneumothorax. No pulmonary edema. Normal cardiomediastinal contours. Left axillary surgical clips. IMPRESSION: Right upper lobe atelectasis. Chest CT recommended to exclude hilar mass or other obstructive abnormality. Electronically Signed   By: Ulyses Jarred M.D.   On: 04/29/2018 14:21     Assessment & Plan:   Glendal was seen today for back pain.  Diagnoses and all orders for this visit:  DDD (degenerative disc disease), thoracic  Right sided weakness  Failed back surgical syndrome  Chronic pain syndrome -     pregabalin (LYRICA) 100 MG capsule; Take 1 capsule (100 mg total) by mouth 3 (three) times daily.  Chronic, continuous use of opioids  Neck pain  Other orders -     oxyCODONE-acetaminophen (PERCOCET) 10-325 MG tablet; Take 1 tablet by mouth every 8 (eight) hours as needed for pain.        ----------------------------------------------------------------------------------------------------------------------  Problem List Items Addressed This Visit      Unprioritized   Chronic pain syndrome   Relevant Medications   pregabalin  (LYRICA) 100 MG capsule   DDD (degenerative disc disease), thoracic - Primary   Relevant Medications   oxyCODONE-acetaminophen (PERCOCET) 10-325 MG tablet   Right sided weakness    Other Visit Diagnoses    Failed back surgical syndrome       Relevant Medications   oxyCODONE-acetaminophen (PERCOCET) 10-325 MG tablet   Chronic, continuous use of opioids       Neck pain            ----------------------------------------------------------------------------------------------------------------------  1. DDD (degenerative disc disease), thoracic In the past he has had epidural steroids and other interventional therapy without relief.  He is considered a nonsurgical candidate.  He has been on chronic opioid management for an extended period of time and this has benefited him.  We have reviewed the [facility] practitioner database information and it is appropriate.  We will refill his medication today.  2. Right sided weakness As above  3. Failed back surgical syndrome As above  4. Chronic pain syndrome As above.  In the past he has been on Lyrica 100 mg tablets and this is been effective for him.  We will refill this for him today with 2 refills. - pregabalin (LYRICA) 100 MG capsule; Take 1 capsule (100 mg total) by mouth 3 (three) times daily.  Dispense: 90 capsule; Refill: 2  5. Chronic, continuous use of opioids As above.  6. Neck pain As above he is to return to clinic in 1 month   ----------------------------------------------------------------------------------------------------------------------  I have changed [name] LYRICA to pregabalin. I am also having him start on oxyCODONE-acetaminophen. Additionally, I am having him maintain his aspirin EC, HUMULIN R U-500 KWIKPEN, DOCUSATE SODIUM PO, tamsulosin, diphenhydrAMINE, Vitamin D (Ergocalciferol), LORazepam, acetaminophen, nystatin, nystatin, linaclotide, mupirocin ointment, omeprazole, glucose blood,  glucose blood, freestyle, levocetirizine, Tiotropium Bromide-Olodaterol, furosemide, ondansetron, traZODone, atorvastatin, promethazine, and doxycycline.   Meds ordered this encounter  Medications  . oxyCODONE-acetaminophen (PERCOCET) 10-325 MG tablet    Sig: Take 1 tablet by mouth every 8 (eight) hours as needed for pain.    Dispense:  90 tablet    Refill:  0  . pregabalin (LYRICA) 100 MG capsule    Sig: Take 1 capsule (100 mg total) by mouth 3 (three) times daily.    Dispense:  90 capsule  Refill:  2   Patient's Medications  New Prescriptions   OXYCODONE-ACETAMINOPHEN (PERCOCET) 10-325 MG TABLET    Take 1 tablet by mouth every 8 (eight) hours as needed for pain.  Previous Medications   ACETAMINOPHEN (TYLENOL) 500 MG TABLET    Take 500-1,000 mg by mouth every 6 (six) hours as needed for headache.    ASPIRIN EC 81 MG TABLET    Take 81 mg by mouth every morning. Reported on 12/05/2015   ATORVASTATIN (LIPITOR) 10 MG TABLET    Take 1 tablet (10 mg total) by mouth daily at 6 PM.   DIPHENHYDRAMINE (BENADRYL) 25 MG CAPSULE    Take 25 mg by mouth at bedtime as needed for sleep.   DOCUSATE SODIUM PO    Take 240 mg by mouth daily.    DOXYCYCLINE (VIBRA-TABS) 100 MG TABLET    Take 1 tablet (100 mg total) by mouth 2 (two) times daily. With food   FUROSEMIDE (LASIX) 40 MG TABLET    Take 1 tablet (40 mg total) by mouth daily.   GLUCOSE BLOOD (FREESTYLE LITE) TEST STRIP    Use as instructed   GLUCOSE BLOOD TEST STRIP    Check qid Use as instructed   HUMULIN R U-500 KWIKPEN 500 UNIT/ML KWIKPEN    Inject 0-125 Units into the skin 4 (four) times daily -  with meals and at bedtime. Sliding scale: (u500 insulin) Breakfast: 70-90: 51u, 91-130: 85u, 131-150: 90u, 151-200: 95u, 201-250: 100u, 251-300: 105u, 301-350: 110u, 351-400: 115u, 401-450: 120u, >450: 125u Lunch:  70-90: 45u, 91-130: 75u, 131-150: 80u, 151-200: 85u, 201-250: 90u, 251-300: 95u, 301-350: 100u, 351-400: 105u, 401-450: 110u, >450:  115u Supper: 70-90: 42u, 91-130: 70u, 131-150: 75u, 151-200: 80u, 201-250: 85u, 251-300: 90u, 301-350: 95u, 351-400: 100u, 401-450: 105u, >450: 110u Nighttime:  70-90: 0u, 91-130: 0u, 131-150: 0u, 151-200: 0u, 201-250: 0u, 251-300: 1u, 301-350: 2u, 351-400: 3u, 401-450: 4u, >450: 5u   LANCETS (FREESTYLE) LANCETS    Qid check glucoseUse as instructed   LEVOCETIRIZINE (XYZAL) 5 MG TABLET    Take 5 mg by mouth every evening.   LINACLOTIDE (LINZESS) 72 MCG CAPSULE    Take 1 capsule (72 mcg total) by mouth daily before breakfast.   LORAZEPAM (ATIVAN) 0.5 MG TABLET    Take 0.5 mg by mouth 3 (three) times daily as needed for anxiety.   MUPIROCIN OINTMENT (BACTROBAN) 2 %    Apply 1 application topically 2 (two) times daily.   NYSTATIN (MYCOSTATIN) 100000 UNIT/ML SUSPENSION    Take 5 mLs by mouth 4 (four) times daily as needed. For mouth sores.   NYSTATIN (MYCOSTATIN/NYSTOP) POWDER    Apply 1 application topically 2 (two) times daily as needed. For yeast around skin folds   OMEPRAZOLE (PRILOSEC) 40 MG CAPSULE    Take 1 capsule (40 mg total) by mouth 2 (two) times daily. 30 min before food   ONDANSETRON (ZOFRAN-ODT) 4 MG DISINTEGRATING TABLET    Take 1 tablet (4 mg total) by mouth 2 (two) times daily as needed for nausea or vomiting.   PROMETHAZINE (PHENERGAN) 25 MG TABLET    Take 1 tablet (25 mg total) by mouth every 8 (eight) hours as needed for nausea or vomiting.   TAMSULOSIN (FLOMAX) 0.4 MG CAPS CAPSULE    Take 1 capsule (0.4 mg total) by mouth daily after supper.   TIOTROPIUM BROMIDE-OLODATEROL (STIOLTO RESPIMAT) 2.5-2.5 MCG/ACT AERS    Inhale 2 puffs into the lungs daily.   TRAZODONE (DESYREL) 100 MG TABLET  Take 1 tablet (100 mg total) by mouth at bedtime.   VITAMIN D, ERGOCALCIFEROL, (DRISDOL) 50000 UNITS CAPS CAPSULE    Take 50,000 Units by mouth every 7 (seven) days.  Modified Medications   Modified Medication Previous Medication   PREGABALIN (LYRICA) 100 MG CAPSULE LYRICA 100 MG capsule       Take 1 capsule (100 mg total) by mouth 3 (three) times daily.    TK1 CAPSULE BY MOUTH THREE TIMES DAILY  Discontinued Medications   No medications on file   ----------------------------------------------------------------------------------------------------------------------  Follow-up: Return in about 1 month (around 07/24/2018) for evaluation, med refill.    Molli Barrows, MD

## 2018-06-23 NOTE — Progress Notes (Signed)
Nursing Pain Medication Assessment:  Safety precautions to be maintained throughout the outpatient stay will include: orient to surroundings, keep bed in low position, maintain call bell within reach at all times, provide assistance with transfer out of bed and ambulation.  Medication Inspection Compliance: Pill count conducted under aseptic conditions, in front of the patient. Neither the pills nor the bottle was removed from the patient's sight at any time. Once count was completed pills were immediately returned to the patient in their original bottle.  Medication: Oxycodone IR Pill/Patch Count: 7 of 90 pills remain Pill/Patch Appearance: Markings consistent with prescribed medication Bottle Appearance: Standard pharmacy container. Clearly labeled. Filled Date: 09 / 11 / 2019 Last Medication intake:  Today

## 2018-06-24 DIAGNOSIS — H18832 Recurrent erosion of cornea, left eye: Secondary | ICD-10-CM | POA: Diagnosis not present

## 2018-06-24 MED FILL — OFLOXACIN 0.3% EYE DROPS: 0.3 | 33 days supply | Qty: 5 | Fill #0

## 2018-06-24 MED FILL — OXYCODONE-ACETAMINOPHEN 10-: 10-325 | 30 days supply | Qty: 90 | Fill #0

## 2018-06-24 MED FILL — VALACYCLOVIR HCL 500 MG TAB: 500 | 30 days supply | Qty: 90 | Fill #0

## 2018-06-25 DIAGNOSIS — B0052 Herpesviral keratitis: Secondary | ICD-10-CM | POA: Diagnosis not present

## 2018-06-25 DIAGNOSIS — H18832 Recurrent erosion of cornea, left eye: Secondary | ICD-10-CM | POA: Diagnosis not present

## 2018-06-25 MED FILL — ZIRGAN 0.15% OPHTHALMIC GEL: 0.15 | 20 days supply | Qty: 5 | Fill #0

## 2018-06-28 DIAGNOSIS — B0052 Herpesviral keratitis: Secondary | ICD-10-CM | POA: Diagnosis not present

## 2018-07-01 DIAGNOSIS — H01021 Squamous blepharitis right upper eyelid: Secondary | ICD-10-CM | POA: Diagnosis not present

## 2018-07-01 DIAGNOSIS — H01024 Squamous blepharitis left upper eyelid: Secondary | ICD-10-CM | POA: Diagnosis not present

## 2018-07-01 DIAGNOSIS — B0052 Herpesviral keratitis: Secondary | ICD-10-CM | POA: Diagnosis not present

## 2018-07-01 DIAGNOSIS — H01025 Squamous blepharitis left lower eyelid: Secondary | ICD-10-CM | POA: Diagnosis not present

## 2018-07-01 DIAGNOSIS — H01022 Squamous blepharitis right lower eyelid: Secondary | ICD-10-CM | POA: Diagnosis not present

## 2018-07-02 MED FILL — PREGABALIN 100 MG CAPS: 100 | 30 days supply | Qty: 90 | Fill #0

## 2018-07-15 DIAGNOSIS — H04123 Dry eye syndrome of bilateral lacrimal glands: Secondary | ICD-10-CM | POA: Diagnosis not present

## 2018-07-15 DIAGNOSIS — H1859 Other hereditary corneal dystrophies: Secondary | ICD-10-CM | POA: Diagnosis not present

## 2018-07-15 DIAGNOSIS — B0052 Herpesviral keratitis: Secondary | ICD-10-CM | POA: Diagnosis not present

## 2018-07-19 MED FILL — traZODone HCL 100 MG TABS: 100 | 90 days supply | Qty: 90 | Fill #0

## 2018-07-20 ENCOUNTER — Other Ambulatory Visit: Payer: Self-pay

## 2018-07-20 ENCOUNTER — Ambulatory Visit: Payer: 59 | Attending: Anesthesiology | Admitting: Anesthesiology

## 2018-07-20 ENCOUNTER — Encounter: Payer: Self-pay | Admitting: Anesthesiology

## 2018-07-20 VITALS — BP 155/65 | HR 91 | Temp 98.4°F | Resp 16 | Ht 73.0 in | Wt 262.0 lb

## 2018-07-20 DIAGNOSIS — G894 Chronic pain syndrome: Secondary | ICD-10-CM | POA: Diagnosis not present

## 2018-07-20 DIAGNOSIS — Z79891 Long term (current) use of opiate analgesic: Secondary | ICD-10-CM | POA: Diagnosis not present

## 2018-07-20 DIAGNOSIS — M25511 Pain in right shoulder: Secondary | ICD-10-CM

## 2018-07-20 DIAGNOSIS — Z79899 Other long term (current) drug therapy: Secondary | ICD-10-CM | POA: Diagnosis not present

## 2018-07-20 DIAGNOSIS — M5134 Other intervertebral disc degeneration, thoracic region: Secondary | ICD-10-CM

## 2018-07-20 DIAGNOSIS — M25512 Pain in left shoulder: Secondary | ICD-10-CM | POA: Diagnosis not present

## 2018-07-20 DIAGNOSIS — M961 Postlaminectomy syndrome, not elsewhere classified: Secondary | ICD-10-CM

## 2018-07-20 DIAGNOSIS — M542 Cervicalgia: Secondary | ICD-10-CM

## 2018-07-20 DIAGNOSIS — R531 Weakness: Secondary | ICD-10-CM

## 2018-07-20 DIAGNOSIS — F1190 Opioid use, unspecified, uncomplicated: Secondary | ICD-10-CM

## 2018-07-20 DIAGNOSIS — Z794 Long term (current) use of insulin: Secondary | ICD-10-CM | POA: Diagnosis not present

## 2018-07-20 DIAGNOSIS — M545 Low back pain: Secondary | ICD-10-CM | POA: Diagnosis present

## 2018-07-20 MED ORDER — OXYCODONE-ACETAMINOPHEN 10-325 MG PO TABS: 1.0000 | ORAL_TABLET | Freq: Three times a day (TID) | ORAL | 0 refills | Status: AC | PRN

## 2018-07-20 NOTE — Progress Notes (Signed)
Subjective:  Patient ID: [name], male    DOB: 1951/04/28  Age: 67 y.o. MRN: [id number]  CC: Back Pain (low); Shoulder Pain (bilateral); and Neck Pain   Procedure: None  HPI [name] presents for evaluation.  He was last seen a month ago and continues to have severe low back pain and lower leg pain of the same quality characteristic distribution.  He is taking his medications as prescribed taking the Percocet 10 mg tablets 3 times a day and this is working well for him he reports.  Based on the narcotic assessment sheet he does derive functional lifestyle improvement with the medications much better than without.  He has no side effects with the medicines.  The quality characteristic distribution of his pain have been stable in nature otherwise and no change in lower extremity strength or function or bowel bladder function are noted at this time.  He feels that he is functioning better with the medication management.  Outpatient Medications Prior to Visit  Medication Sig Dispense Refill  . acetaminophen (TYLENOL) 500 MG tablet Take 500-1,000 mg by mouth every 6 (six) hours as needed for headache.     Marland Kitchen atorvastatin (LIPITOR) 10 MG tablet Take 1 tablet (10 mg total) by mouth daily at 6 PM. 90 tablet 3  . diphenhydrAMINE (BENADRYL) 25 mg capsule Take 25 mg by mouth at bedtime as needed for sleep.    Marland Kitchen DOCUSATE SODIUM PO Take 240 mg by mouth daily.     . furosemide (LASIX) 40 MG tablet Take 1 tablet (40 mg total) by mouth daily. 90 tablet 3  . glucose blood (FREESTYLE LITE) test strip Use as instructed 400 each 3  . glucose blood test strip Check qid Use as instructed 400 each 12  . HUMULIN R U-500 KWIKPEN 500 UNIT/ML kwikpen Inject 0-125 Units into the skin 4 (four) times daily -  with meals and at bedtime. Sliding scale: (u500 insulin) Breakfast: 70-90: 51u, 91-130: 85u, 131-150: 90u, 151-200: 95u, 201-250: 100u, 251-300: 105u, 301-350: 110u, 351-400: 115u, 401-450: 120u,  >450: 125u Lunch:  70-90: 45u, 91-130: 75u, 131-150: 80u, 151-200: 85u, 201-250: 90u, 251-300: 95u, 301-350: 100u, 351-400: 105u, 401-450: 110u, >450: 115u Supper: 70-90: 42u, 91-130: 70u, 131-150: 75u, 151-200: 80u, 201-250: 85u, 251-300: 90u, 301-350: 95u, 351-400: 100u, 401-450: 105u, >450: 110u Nighttime:  70-90: 0u, 91-130: 0u, 131-150: 0u, 151-200: 0u, 201-250: 0u, 251-300: 1u, 301-350: 2u, 351-400: 3u, 401-450: 4u, >450: 5u  11  . Lancets (FREESTYLE) lancets Qid check glucoseUse as instructed 400 each 3  . levocetirizine (XYZAL) 5 MG tablet Take 5 mg by mouth every evening.    . linaclotide (LINZESS) 72 MCG capsule Take 1 capsule (72 mcg total) by mouth daily before breakfast. 90 capsule 3  . LORazepam (ATIVAN) 0.5 MG tablet Take 0.5 mg by mouth 3 (three) times daily as needed for anxiety.    . mupirocin ointment (BACTROBAN) 2 % Apply 1 application topically 2 (two) times daily. 30 g 0  . nystatin (MYCOSTATIN) 100000 UNIT/ML suspension Take 5 mLs by mouth 4 (four) times daily as needed. For mouth sores.    . nystatin (MYCOSTATIN/NYSTOP) powder Apply 1 application topically 2 (two) times daily as needed. For yeast around skin folds    . omeprazole (PRILOSEC) 40 MG capsule Take 1 capsule (40 mg total) by mouth 2 (two) times daily. 30 min before food 120 capsule 0  . ondansetron (ZOFRAN-ODT) 4 MG disintegrating tablet Take 1 tablet (4  mg total) by mouth 2 (two) times daily as needed for nausea or vomiting. 60 tablet 1  . pregabalin (LYRICA) 100 MG capsule Take 1 capsule (100 mg total) by mouth 3 (three) times daily. 90 capsule 2  . promethazine (PHENERGAN) 25 MG tablet Take 1 tablet (25 mg total) by mouth every 8 (eight) hours as needed for nausea or vomiting. 90 tablet 1  . tamsulosin (FLOMAX) 0.4 MG CAPS capsule Take 1 capsule (0.4 mg total) by mouth daily after supper. (Patient taking differently: Take 0.4 mg by mouth at bedtime. ) 90 capsule 1  . Tiotropium Bromide-Olodaterol (STIOLTO  RESPIMAT) 2.5-2.5 MCG/ACT AERS Inhale 2 puffs into the lungs daily. 4 g 10  . traZODone (DESYREL) 100 MG tablet Take 1 tablet (100 mg total) by mouth at bedtime. 90 tablet 3  . Vitamin D, Ergocalciferol, (DRISDOL) 50000 units CAPS capsule Take 50,000 Units by mouth every 7 (seven) days.  0  . oxyCODONE-acetaminophen (PERCOCET) 10-325 MG tablet Take 1 tablet by mouth every 8 (eight) hours as needed for pain. 90 tablet 0  . aspirin EC 81 MG tablet Take 81 mg by mouth every morning. Reported on 12/05/2015 1 tablet 0  . doxycycline (VIBRA-TABS) 100 MG tablet Take 1 tablet (100 mg total) by mouth 2 (two) times daily. With food (Patient not taking: Reported on 07/20/2018) 20 tablet 0   No facility-administered medications prior to visit.     Review of Systems CNS: No confusion or sedation Cardiac: No angina or palpitations GI: No abdominal pain or constipation Constitutional: No nausea vomiting fevers or chills  Objective:  BP (!) 155/65   Pulse 91   Temp 98.4 F (36.9 C)   Resp 16   Ht 6\' 1"  (1.854 m)   Wt 262 lb (118.8 kg)   SpO2 99%   BMI 34.57 kg/m    BP Readings from Last 3 Encounters:  07/20/18 (!) 155/65  06/23/18 (!) 162/62  05/27/18 (!) 168/70     Wt Readings from Last 3 Encounters:  07/20/18 262 lb (118.8 kg)  06/23/18 262 lb (118.8 kg)  05/27/18 275 lb 6.4 oz (124.9 kg)     Physical Exam Pt is alert and oriented PERRL EOMI HEART IS RRR no murmur or rub LCTA no wheezing or rales   Labs  Lab Results  Component Value Date   HGBA1C 7.8 (H) 04/02/2018   HGBA1C 7.3 (H) 12/06/2015   HGBA1C 10.4 04/30/2015   Lab Results  Component Value Date   MICROALBUR 8.1 07/16/2015   LDLCALC 50 12/06/2015   CREATININE 1.00 04/02/2018    -------------------------------------------------------------------------------------------------------------------- Lab Results  Component Value Date   WBC 4.7 04/02/2018   HGB 12.6 (L) 04/02/2018   HCT 36.7 (L) 04/02/2018   PLT  268.0 04/02/2018   GLUCOSE 340 (H) 04/02/2018   CHOL 114 04/02/2018   TRIG 223.0 (H) 04/02/2018   HDL 21.30 (L) 04/02/2018   LDLDIRECT 76.0 04/02/2018   LDLCALC 50 12/06/2015   ALT 16 04/02/2018   AST 15 04/02/2018   NA 138 04/02/2018   K 3.8 04/02/2018   CL 103 04/02/2018   CREATININE 1.00 04/02/2018   BUN 10 04/02/2018   CO2 26 04/02/2018   TSH 1.22 03/25/2012   PSA 0.40 03/25/2012   INR 1.01 01/28/2016   HGBA1C 7.8 (H) 04/02/2018   MICROALBUR 8.1 07/16/2015    --------------------------------------------------------------------------------------------------------------------- Dg Chest 2 View  Result Date: 04/29/2018 CLINICAL DATA:  Shortness of breath EXAM: CHEST - 2 VIEW COMPARISON:  Chest  radiograph 04/22/2016 FINDINGS: Streaky opacity at the medial right chest with elevation of the right hilum. No pleural effusion or pneumothorax. No pulmonary edema. Normal cardiomediastinal contours. Left axillary surgical clips. IMPRESSION: Right upper lobe atelectasis. Chest CT recommended to exclude hilar mass or other obstructive abnormality. Electronically Signed   By: Ulyses Jarred M.D.   On: 04/29/2018 14:21     Assessment & Plan:   [name] was seen today for back pain, shoulder pain and neck pain.  Diagnoses and all orders for this visit:  DDD (degenerative disc disease), thoracic  Right sided weakness  Failed back surgical syndrome  Chronic pain syndrome  Chronic, continuous use of opioids  Neck pain  Pain of both shoulder joints  Other orders -     oxyCODONE-acetaminophen (PERCOCET) 10-325 MG tablet; Take 1 tablet by mouth every 8 (eight) hours as needed for pain.        ----------------------------------------------------------------------------------------------------------------------  Problem List Items Addressed This Visit      Unprioritized   Chronic pain syndrome   DDD (degenerative disc disease), thoracic - Primary   Relevant Medications    oxyCODONE-acetaminophen (PERCOCET) 10-325 MG tablet   Right sided weakness   SHOULDER PAIN, BILATERAL    Other Visit Diagnoses    Failed back surgical syndrome       Relevant Medications   oxyCODONE-acetaminophen (PERCOCET) 10-325 MG tablet   Chronic, continuous use of opioids       Neck pain            ----------------------------------------------------------------------------------------------------------------------  1. DDD (degenerative disc disease), thoracic Continue with core stretching and strengthening as tolerated  2. Right sided weakness Continue follow-up with primary care physicians  3. Failed back surgical syndrome We will continue him on his opioid therapy.  He seems to be doing well with this and has had this chronically over many years.  Refills will be given today for November 8.  He is return to clinic in 1 month.  We have reviewed the [facility] practitioner database information and it is appropriate.  4. Chronic pain syndrome As above  5. Chronic, continuous use of opioids As above  6. Neck pain Continue stretching strengthening exercises.  7. Pain of both shoulder joints     ----------------------------------------------------------------------------------------------------------------------  I have discontinued Warren Lacy L. Mooney Pena's aspirin EC and doxycycline. I am also having him maintain his HUMULIN R U-500 KWIKPEN, DOCUSATE SODIUM PO, tamsulosin, diphenhydrAMINE, Vitamin D (Ergocalciferol), LORazepam, acetaminophen, nystatin, nystatin, linaclotide, mupirocin ointment, omeprazole, glucose blood, glucose blood, freestyle, levocetirizine, Tiotropium Bromide-Olodaterol, furosemide, ondansetron, traZODone, atorvastatin, promethazine, pregabalin, and oxyCODONE-acetaminophen.   Meds ordered this encounter  Medications  . oxyCODONE-acetaminophen (PERCOCET) 10-325 MG tablet    Sig: Take 1 tablet by mouth every 8 (eight) hours as needed for pain.     Dispense:  90 tablet    Refill:  0    Do not fill until Jun 23 2018 Must last 30 days   Patient's Medications  New Prescriptions   No medications on file  Previous Medications   ACETAMINOPHEN (TYLENOL) 500 MG TABLET    Take 500-1,000 mg by mouth every 6 (six) hours as needed for headache.    ATORVASTATIN (LIPITOR) 10 MG TABLET    Take 1 tablet (10 mg total) by mouth daily at 6 PM.   DIPHENHYDRAMINE (BENADRYL) 25 MG CAPSULE    Take 25 mg by mouth at bedtime as needed for sleep.   DOCUSATE SODIUM PO    Take 240 mg by mouth daily.  FUROSEMIDE (LASIX) 40 MG TABLET    Take 1 tablet (40 mg total) by mouth daily.   GLUCOSE BLOOD (FREESTYLE LITE) TEST STRIP    Use as instructed   GLUCOSE BLOOD TEST STRIP    Check qid Use as instructed   HUMULIN R U-500 KWIKPEN 500 UNIT/ML KWIKPEN    Inject 0-125 Units into the skin 4 (four) times daily -  with meals and at bedtime. Sliding scale: (u500 insulin) Breakfast: 70-90: 51u, 91-130: 85u, 131-150: 90u, 151-200: 95u, 201-250: 100u, 251-300: 105u, 301-350: 110u, 351-400: 115u, 401-450: 120u, >450: 125u Lunch:  70-90: 45u, 91-130: 75u, 131-150: 80u, 151-200: 85u, 201-250: 90u, 251-300: 95u, 301-350: 100u, 351-400: 105u, 401-450: 110u, >450: 115u Supper: 70-90: 42u, 91-130: 70u, 131-150: 75u, 151-200: 80u, 201-250: 85u, 251-300: 90u, 301-350: 95u, 351-400: 100u, 401-450: 105u, >450: 110u Nighttime:  70-90: 0u, 91-130: 0u, 131-150: 0u, 151-200: 0u, 201-250: 0u, 251-300: 1u, 301-350: 2u, 351-400: 3u, 401-450: 4u, >450: 5u   LANCETS (FREESTYLE) LANCETS    Qid check glucoseUse as instructed   LEVOCETIRIZINE (XYZAL) 5 MG TABLET    Take 5 mg by mouth every evening.   LINACLOTIDE (LINZESS) 72 MCG CAPSULE    Take 1 capsule (72 mcg total) by mouth daily before breakfast.   LORAZEPAM (ATIVAN) 0.5 MG TABLET    Take 0.5 mg by mouth 3 (three) times daily as needed for anxiety.   MUPIROCIN OINTMENT (BACTROBAN) 2 %    Apply 1 application topically 2 (two) times daily.    NYSTATIN (MYCOSTATIN) 100000 UNIT/ML SUSPENSION    Take 5 mLs by mouth 4 (four) times daily as needed. For mouth sores.   NYSTATIN (MYCOSTATIN/NYSTOP) POWDER    Apply 1 application topically 2 (two) times daily as needed. For yeast around skin folds   OMEPRAZOLE (PRILOSEC) 40 MG CAPSULE    Take 1 capsule (40 mg total) by mouth 2 (two) times daily. 30 min before food   ONDANSETRON (ZOFRAN-ODT) 4 MG DISINTEGRATING TABLET    Take 1 tablet (4 mg total) by mouth 2 (two) times daily as needed for nausea or vomiting.   PREGABALIN (LYRICA) 100 MG CAPSULE    Take 1 capsule (100 mg total) by mouth 3 (three) times daily.   PROMETHAZINE (PHENERGAN) 25 MG TABLET    Take 1 tablet (25 mg total) by mouth every 8 (eight) hours as needed for nausea or vomiting.   TAMSULOSIN (FLOMAX) 0.4 MG CAPS CAPSULE    Take 1 capsule (0.4 mg total) by mouth daily after supper.   TIOTROPIUM BROMIDE-OLODATEROL (STIOLTO RESPIMAT) 2.5-2.5 MCG/ACT AERS    Inhale 2 puffs into the lungs daily.   TRAZODONE (DESYREL) 100 MG TABLET    Take 1 tablet (100 mg total) by mouth at bedtime.   VITAMIN D, ERGOCALCIFEROL, (DRISDOL) 50000 UNITS CAPS CAPSULE    Take 50,000 Units by mouth every 7 (seven) days.  Modified Medications   Modified Medication Previous Medication   OXYCODONE-ACETAMINOPHEN (PERCOCET) 10-325 MG TABLET oxyCODONE-acetaminophen (PERCOCET) 10-325 MG tablet      Take 1 tablet by mouth every 8 (eight) hours as needed for pain.    Take 1 tablet by mouth every 8 (eight) hours as needed for pain.  Discontinued Medications   ASPIRIN EC 81 MG TABLET    Take 81 mg by mouth every morning. Reported on 12/05/2015   DOXYCYCLINE (VIBRA-TABS) 100 MG TABLET    Take 1 tablet (100 mg total) by mouth 2 (two) times daily. With food   ----------------------------------------------------------------------------------------------------------------------  Follow-up: Return  in about 1 month (around 08/19/2018).    Molli Barrows, MD

## 2018-07-20 NOTE — Patient Instructions (Signed)
You have been given Rx for oxycodone with acetaminophen to last until 08/19/2018.

## 2018-07-20 NOTE — Progress Notes (Signed)
Nursing Pain Medication Assessment:  Safety precautions to be maintained throughout the outpatient stay will include: orient to surroundings, keep bed in low position, maintain call bell within reach at all times, provide assistance with transfer out of bed and ambulation.  Medication Inspection Compliance: Pill count conducted under aseptic conditions, in front of the patient. Neither the pills nor the bottle was removed from the patient's sight at any time. Once count was completed pills were immediately returned to the patient in their original bottle.  Medication: Oxycodone/APAP Pill/Patch Count: 14 of 90 pills remain Pill/Patch Appearance: Markings consistent with prescribed medication Bottle Appearance: Standard pharmacy container. Clearly labeled. Filled Date:10 / 10/ 2019 Last Medication intake:  Today

## 2018-07-26 DIAGNOSIS — K208 Other esophagitis: Secondary | ICD-10-CM | POA: Diagnosis not present

## 2018-07-26 DIAGNOSIS — R1310 Dysphagia, unspecified: Secondary | ICD-10-CM | POA: Diagnosis not present

## 2018-07-26 DIAGNOSIS — R070 Pain in throat: Secondary | ICD-10-CM | POA: Diagnosis not present

## 2018-07-26 MED FILL — OXYCODONE-ACETAMINOPHEN 10-: 10-325 | 30 days supply | Qty: 90 | Fill #0

## 2018-08-03 MED FILL — PREGABALIN 100 MG CAPS: 100 | 30 days supply | Qty: 90 | Fill #1

## 2018-08-05 DIAGNOSIS — E1169 Type 2 diabetes mellitus with other specified complication: Secondary | ICD-10-CM | POA: Diagnosis not present

## 2018-08-05 DIAGNOSIS — I10 Essential (primary) hypertension: Secondary | ICD-10-CM | POA: Diagnosis not present

## 2018-08-05 DIAGNOSIS — Z794 Long term (current) use of insulin: Secondary | ICD-10-CM | POA: Diagnosis not present

## 2018-08-05 DIAGNOSIS — E669 Obesity, unspecified: Secondary | ICD-10-CM | POA: Diagnosis not present

## 2018-08-05 DIAGNOSIS — E1165 Type 2 diabetes mellitus with hyperglycemia: Secondary | ICD-10-CM | POA: Diagnosis not present

## 2018-08-05 DIAGNOSIS — E785 Hyperlipidemia, unspecified: Secondary | ICD-10-CM | POA: Diagnosis not present

## 2018-08-05 DIAGNOSIS — E1159 Type 2 diabetes mellitus with other circulatory complications: Secondary | ICD-10-CM | POA: Diagnosis not present

## 2018-08-09 DIAGNOSIS — K219 Gastro-esophageal reflux disease without esophagitis: Secondary | ICD-10-CM | POA: Diagnosis not present

## 2018-08-09 DIAGNOSIS — J383 Other diseases of vocal cords: Secondary | ICD-10-CM | POA: Diagnosis not present

## 2018-08-09 MED FILL — PANTOPRAZOLE SOD DR 40 MG T: 40 | 30 days supply | Qty: 60 | Fill #0

## 2018-08-09 MED FILL — HUMULIN R 500 UNITS/ML KWIK: 500 | 84 days supply | Qty: 72 | Fill #1

## 2018-08-16 ENCOUNTER — Ambulatory Visit: Payer: 59 | Attending: Anesthesiology | Admitting: Anesthesiology

## 2018-08-16 ENCOUNTER — Encounter: Payer: Self-pay | Admitting: Anesthesiology

## 2018-08-16 ENCOUNTER — Other Ambulatory Visit: Payer: Self-pay

## 2018-08-16 VITALS — BP 164/61 | HR 79 | Temp 97.5°F | Resp 16 | Ht 73.0 in | Wt 265.0 lb

## 2018-08-16 DIAGNOSIS — F1190 Opioid use, unspecified, uncomplicated: Secondary | ICD-10-CM

## 2018-08-16 DIAGNOSIS — Z79891 Long term (current) use of opiate analgesic: Secondary | ICD-10-CM | POA: Insufficient documentation

## 2018-08-16 DIAGNOSIS — M5134 Other intervertebral disc degeneration, thoracic region: Secondary | ICD-10-CM | POA: Insufficient documentation

## 2018-08-16 DIAGNOSIS — M5136 Other intervertebral disc degeneration, lumbar region: Secondary | ICD-10-CM | POA: Diagnosis not present

## 2018-08-16 DIAGNOSIS — M961 Postlaminectomy syndrome, not elsewhere classified: Secondary | ICD-10-CM | POA: Diagnosis not present

## 2018-08-16 DIAGNOSIS — M542 Cervicalgia: Secondary | ICD-10-CM | POA: Diagnosis not present

## 2018-08-16 DIAGNOSIS — R531 Weakness: Secondary | ICD-10-CM | POA: Diagnosis not present

## 2018-08-16 DIAGNOSIS — Z79899 Other long term (current) drug therapy: Secondary | ICD-10-CM | POA: Diagnosis not present

## 2018-08-16 DIAGNOSIS — G894 Chronic pain syndrome: Secondary | ICD-10-CM | POA: Diagnosis not present

## 2018-08-16 DIAGNOSIS — M25511 Pain in right shoulder: Secondary | ICD-10-CM | POA: Diagnosis not present

## 2018-08-16 DIAGNOSIS — M25512 Pain in left shoulder: Secondary | ICD-10-CM | POA: Insufficient documentation

## 2018-08-16 MED ORDER — OXYCODONE HCL 10 MG PO TABS: 10.0000 mg | ORAL_TABLET | Freq: Four times a day (QID) | ORAL | 0 refills | Status: DC

## 2018-08-16 MED FILL — oxyCODONE HCL 10 MG TABS: 10 | 30 days supply | Qty: 120 | Fill #0

## 2018-08-16 NOTE — Patient Instructions (Signed)
You have been given prescription for Oxycodone 10mg  to last 10/14/2018

## 2018-08-16 NOTE — Progress Notes (Signed)
Nursing Pain Medication Assessment:  Safety precautions to be maintained throughout the outpatient stay will include: orient to surroundings, keep bed in low position, maintain call bell within reach at all times, provide assistance with transfer out of bed and ambulation.  Medication Inspection Compliance: Pill count conducted under aseptic conditions, in front of the patient. Neither the pills nor the bottle was removed from the patient's sight at any time. Once count was completed pills were immediately returned to the patient in their original bottle.  Medication: Oxycodone/APAP Pill/Patch Count: 23 of 90 pills remain Pill/Patch Appearance: Markings consistent with prescribed medication Bottle Appearance: Standard pharmacy container. Clearly labeled. Filled Date: 11 11/  2019 Last Medication intake:  Today

## 2018-08-17 NOTE — Progress Notes (Signed)
Oxycodone/APAP 23 pills disposed in front of wife. Empty bottle given back to patient. Witnessed with K. Higinio Plan, RN. (08/16/18)

## 2018-08-17 NOTE — Progress Notes (Signed)
Subjective:  Patient ID: [name], male    DOB: 1951/03/25  Age: 67 y.o. MRN: [id number]  CC: Back Pain (low)   Procedure: None  HPI [name] presents for reevaluation.  He was last seen a month ago and continues to have severe incapacitating low back pain and leg pain.  The quality of the pain is been similar nature but he continues to breakthrough with her current regimen.  He is accompanied by his wife today who states that he is in considerable pain during periods of the time when the medication seems to lose its effect.  In the past he has been on the same regimen 4 times a day and this is been more effective she states.  He has had no side effects noted with the medication.  In review of his narcotic assessment sheet he is not having any untoward side effects noted and gets good relief with the medication but frequent breakthrough.  She reports that in the past he has been on a higher dose namely 4 times a day with more benefit.  He is taking his other medications as prescribed as well.  He denies any diverting or illicit use.  Otherwise he is in his usual state of health but is just rated and that he spends intermittent hours of the day in considerable pain he reports.  At this stage she has followed up with surgeons in the past and is a nonsurgical candidate secondary to the severity of his low back pathology.  Outpatient Medications Prior to Visit  Medication Sig Dispense Refill  . acetaminophen (TYLENOL) 500 MG tablet Take 500-1,000 mg by mouth every 6 (six) hours as needed for headache.     Marland Kitchen atorvastatin (LIPITOR) 10 MG tablet Take 1 tablet (10 mg total) by mouth daily at 6 PM. 90 tablet 3  . Continuous Blood Gluc Sensor (FREESTYLE LIBRE 14 DAY SENSOR) MISC U Q 14 DAYS UTD  5  . diphenhydrAMINE (BENADRYL) 25 mg capsule Take 25 mg by mouth at bedtime as needed for sleep.    Marland Kitchen DOCUSATE SODIUM PO Take 240 mg by mouth daily.     . furosemide (LASIX) 40 MG tablet Take 1  tablet (40 mg total) by mouth daily. 90 tablet 3  . glucose blood (FREESTYLE LITE) test strip Use as instructed 400 each 3  . glucose blood test strip Check qid Use as instructed 400 each 12  . HUMULIN R U-500 KWIKPEN 500 UNIT/ML kwikpen Inject 0-125 Units into the skin 4 (four) times daily -  with meals and at bedtime. Sliding scale: (u500 insulin) Breakfast: 70-90: 51u, 91-130: 85u, 131-150: 90u, 151-200: 95u, 201-250: 100u, 251-300: 105u, 301-350: 110u, 351-400: 115u, 401-450: 120u, >450: 125u Lunch:  70-90: 45u, 91-130: 75u, 131-150: 80u, 151-200: 85u, 201-250: 90u, 251-300: 95u, 301-350: 100u, 351-400: 105u, 401-450: 110u, >450: 115u Supper: 70-90: 42u, 91-130: 70u, 131-150: 75u, 151-200: 80u, 201-250: 85u, 251-300: 90u, 301-350: 95u, 351-400: 100u, 401-450: 105u, >450: 110u Nighttime:  70-90: 0u, 91-130: 0u, 131-150: 0u, 151-200: 0u, 201-250: 0u, 251-300: 1u, 301-350: 2u, 351-400: 3u, 401-450: 4u, >450: 5u  11  . Lancets (FREESTYLE) lancets Qid check glucoseUse as instructed 400 each 3  . levocetirizine (XYZAL) 5 MG tablet Take 5 mg by mouth every evening.    . linaclotide (LINZESS) 72 MCG capsule Take 1 capsule (72 mcg total) by mouth daily before breakfast. 90 capsule 3  . LORazepam (ATIVAN) 0.5 MG tablet Take 0.5 mg by  mouth 3 (three) times daily as needed for anxiety.    . mupirocin ointment (BACTROBAN) 2 % Apply 1 application topically 2 (two) times daily. 30 g 0  . nystatin (MYCOSTATIN) 100000 UNIT/ML suspension Take 5 mLs by mouth 4 (four) times daily as needed. For mouth sores.    . nystatin (MYCOSTATIN/NYSTOP) powder Apply 1 application topically 2 (two) times daily as needed. For yeast around skin folds    . ondansetron (ZOFRAN-ODT) 4 MG disintegrating tablet Take 1 tablet (4 mg total) by mouth 2 (two) times daily as needed for nausea or vomiting. 60 tablet 1  . oxyCODONE-acetaminophen (PERCOCET) 10-325 MG tablet Take 1 tablet by mouth every 8 (eight) hours as needed for pain. 90  tablet 0  . pantoprazole (PROTONIX) 40 MG tablet   1  . pregabalin (LYRICA) 100 MG capsule Take 1 capsule (100 mg total) by mouth 3 (three) times daily. 90 capsule 2  . promethazine (PHENERGAN) 25 MG tablet Take 1 tablet (25 mg total) by mouth every 8 (eight) hours as needed for nausea or vomiting. 90 tablet 1  . tamsulosin (FLOMAX) 0.4 MG CAPS capsule Take 1 capsule (0.4 mg total) by mouth daily after supper. (Patient taking differently: Take 0.4 mg by mouth at bedtime. ) 90 capsule 1  . Tiotropium Bromide-Olodaterol (STIOLTO RESPIMAT) 2.5-2.5 MCG/ACT AERS Inhale 2 puffs into the lungs daily. 4 g 10  . traZODone (DESYREL) 100 MG tablet Take 1 tablet (100 mg total) by mouth at bedtime. 90 tablet 3  . valACYclovir (VALTREX) 500 MG tablet Take 500 mg by mouth 3 (three) times daily.  1  . furosemide (LASIX) 20 MG tablet Take by mouth.    Marland Kitchen omeprazole (PRILOSEC) 40 MG capsule Take 1 capsule (40 mg total) by mouth 2 (two) times daily. 30 min before food (Patient not taking: Reported on 08/16/2018) 120 capsule 0  . Vitamin D, Ergocalciferol, (DRISDOL) 50000 units CAPS capsule Take 50,000 Units by mouth every 7 (seven) days.  0   No facility-administered medications prior to visit.     Review of Systems CNS: No confusion or sedation Cardiac: No angina or palpitations GI: No abdominal pain or constipation Constitutional: No nausea vomiting fevers or chills  Objective:  BP (!) 164/61   Pulse 79   Temp (!) 97.5 F (36.4 C)   Resp 16   Ht 6\' 1"  (1.854 m)   Wt 265 lb (120.2 kg)   SpO2 100%   BMI 34.96 kg/m    BP Readings from Last 3 Encounters:  08/16/18 (!) 164/61  07/20/18 (!) 155/65  06/23/18 (!) 162/62     Wt Readings from Last 3 Encounters:  08/16/18 265 lb (120.2 kg)  07/20/18 262 lb (118.8 kg)  06/23/18 262 lb (118.8 kg)     Physical Exam Pt is alert and oriented PERRL EOMI HEART IS RRR no murmur or rub LCTA no wheezing or rales MUSCULOSKELETAL he remains  wheelchair-bound.  His lower extremity muscle tone and bulk appears at baseline.  Labs  Lab Results  Component Value Date   HGBA1C 7.8 (H) 04/02/2018   HGBA1C 7.3 (H) 12/06/2015   HGBA1C 10.4 04/30/2015   Lab Results  Component Value Date   MICROALBUR 8.1 07/16/2015   LDLCALC 50 12/06/2015   CREATININE 1.00 04/02/2018    -------------------------------------------------------------------------------------------------------------------- Lab Results  Component Value Date   WBC 4.7 04/02/2018   HGB 12.6 (L) 04/02/2018   HCT 36.7 (L) 04/02/2018   PLT 268.0 04/02/2018   GLUCOSE 340 (  H) 04/02/2018   CHOL 114 04/02/2018   TRIG 223.0 (H) 04/02/2018   HDL 21.30 (L) 04/02/2018   LDLDIRECT 76.0 04/02/2018   LDLCALC 50 12/06/2015   ALT 16 04/02/2018   AST 15 04/02/2018   NA 138 04/02/2018   K 3.8 04/02/2018   CL 103 04/02/2018   CREATININE 1.00 04/02/2018   BUN 10 04/02/2018   CO2 26 04/02/2018   TSH 1.22 03/25/2012   PSA 0.40 03/25/2012   INR 1.01 01/28/2016   HGBA1C 7.8 (H) 04/02/2018   MICROALBUR 8.1 07/16/2015    --------------------------------------------------------------------------------------------------------------------- Dg Chest 2 View  Result Date: 04/29/2018 CLINICAL DATA:  Shortness of breath EXAM: CHEST - 2 VIEW COMPARISON:  Chest radiograph 04/22/2016 FINDINGS: Streaky opacity at the medial right chest with elevation of the right hilum. No pleural effusion or pneumothorax. No pulmonary edema. Normal cardiomediastinal contours. Left axillary surgical clips. IMPRESSION: Right upper lobe atelectasis. Chest CT recommended to exclude hilar mass or other obstructive abnormality. Electronically Signed   By: Ulyses Jarred M.D.   On: 04/29/2018 14:21     Assessment & Plan:   Talley was seen today for back pain.  Diagnoses and all orders for this visit:  DDD (degenerative disc disease), thoracic  Right sided weakness  Failed back surgical syndrome  Chronic  pain syndrome  Chronic, continuous use of opioids  Neck pain  Pain of both shoulder joints  DDD (degenerative disc disease), lumbar  Other orders -     Discontinue: Oxycodone HCl 10 MG TABS; Take 1 tablet (10 mg total) by mouth 4 (four) times daily. -     Oxycodone HCl 10 MG TABS; Take 1 tablet (10 mg total) by mouth 4 (four) times daily.        ----------------------------------------------------------------------------------------------------------------------  Problem List Items Addressed This Visit      Unprioritized   Chronic pain syndrome   DDD (degenerative disc disease), lumbar   Relevant Medications   Oxycodone HCl 10 MG TABS   DDD (degenerative disc disease), thoracic - Primary   Relevant Medications   Oxycodone HCl 10 MG TABS   Right sided weakness   SHOULDER PAIN, BILATERAL    Other Visit Diagnoses    Failed back surgical syndrome       Relevant Medications   Oxycodone HCl 10 MG TABS   Chronic, continuous use of opioids       Neck pain            ----------------------------------------------------------------------------------------------------------------------  1. DDD (degenerative disc disease), thoracic   2. Right sided weakness This is of a chronic nature.  No changes are reported.  3. Failed back surgical syndrome He is to continue follow-up with his primary care surgical team and primary care physicians.  In reference to his pain management and as per discussion with his wife and the patient today we will switch him over to oxycodone 10 mg tablets to be taken 4 times a day.  This will be 40 mg of oxycodone per day.  He is instructed to contact us should he have any side effects with this change.  We will schedule him back in 2 months for reevaluation.  He is not a candidate for injection therapy however we have talked about options regarding a possible opioid pump implant versus spinal cord stimulator for consideration.  He does not desire to  pursue this at this time.  4. Chronic pain syndrome As above.  Scheduled for return in 2 months with prescriptions given for December 2  and December 31 which will be one day earlier than his refill with next prescription due January 31.  5. Chronic, continuous use of opioids Have reviewed the [facility] practitioner database information and it is appropriate.  6. Neck pain   7. Pain of both shoulder joints   8. DDD (degenerative disc disease), lumbar     ----------------------------------------------------------------------------------------------------------------------  I am having [name] maintain his HUMULIN R U-500 KWIKPEN, DOCUSATE SODIUM PO, tamsulosin, diphenhydrAMINE, Vitamin D (Ergocalciferol), LORazepam, acetaminophen, nystatin, nystatin, linaclotide, mupirocin ointment, omeprazole, glucose blood, glucose blood, freestyle, levocetirizine, Tiotropium Bromide-Olodaterol, furosemide, ondansetron, traZODone, atorvastatin, promethazine, pregabalin, oxyCODONE-acetaminophen, pantoprazole, valACYclovir, furosemide, FREESTYLE LIBRE 14 DAY SENSOR, and Oxycodone HCl.   Meds ordered this encounter  Medications  . DISCONTD: Oxycodone HCl 10 MG TABS    Sig: Take 1 tablet (10 mg total) by mouth 4 (four) times daily.    Dispense:  120 tablet    Refill:  0    This is a 30 day supply.  . Oxycodone HCl 10 MG TABS    Sig: Take 1 tablet (10 mg total) by mouth 4 (four) times daily.    Dispense:  120 tablet    Refill:  0    This is a 30 day supply. Do not fill until [id number]   Patient's Medications  New Prescriptions   OXYCODONE HCL 10 MG TABS    Take 1 tablet (10 mg total) by mouth 4 (four) times daily.  Previous Medications   ACETAMINOPHEN (TYLENOL) 500 MG TABLET    Take 500-1,000 mg by mouth every 6 (six) hours as needed for headache.    ATORVASTATIN (LIPITOR) 10 MG TABLET    Take 1 tablet (10 mg total) by mouth daily at 6 PM.   CONTINUOUS BLOOD GLUC SENSOR  (FREESTYLE LIBRE 14 DAY SENSOR) MISC    U Q 14 DAYS UTD   DIPHENHYDRAMINE (BENADRYL) 25 MG CAPSULE    Take 25 mg by mouth at bedtime as needed for sleep.   DOCUSATE SODIUM PO    Take 240 mg by mouth daily.    FUROSEMIDE (LASIX) 20 MG TABLET    Take by mouth.   FUROSEMIDE (LASIX) 40 MG TABLET    Take 1 tablet (40 mg total) by mouth daily.   GLUCOSE BLOOD (FREESTYLE LITE) TEST STRIP    Use as instructed   GLUCOSE BLOOD TEST STRIP    Check qid Use as instructed   HUMULIN R U-500 KWIKPEN 500 UNIT/ML KWIKPEN    Inject 0-125 Units into the skin 4 (four) times daily -  with meals and at bedtime. Sliding scale: (u500 insulin) Breakfast: 70-90: 51u, 91-130: 85u, 131-150: 90u, 151-200: 95u, 201-250: 100u, 251-300: 105u, 301-350: 110u, 351-400: 115u, 401-450: 120u, >450: 125u Lunch:  70-90: 45u, 91-130: 75u, 131-150: 80u, 151-200: 85u, 201-250: 90u, 251-300: 95u, 301-350: 100u, 351-400: 105u, 401-450: 110u, >450: 115u Supper: 70-90: 42u, 91-130: 70u, 131-150: 75u, 151-200: 80u, 201-250: 85u, 251-300: 90u, 301-350: 95u, 351-400: 100u, 401-450: 105u, >450: 110u Nighttime:  70-90: 0u, 91-130: 0u, 131-150: 0u, 151-200: 0u, 201-250: 0u, 251-300: 1u, 301-350: 2u, 351-400: 3u, 401-450: 4u, >450: 5u   LANCETS (FREESTYLE) LANCETS    Qid check glucoseUse as instructed   LEVOCETIRIZINE (XYZAL) 5 MG TABLET    Take 5 mg by mouth every evening.   LINACLOTIDE (LINZESS) 72 MCG CAPSULE    Take 1 capsule (72 mcg total) by mouth daily before breakfast.   LORAZEPAM (ATIVAN) 0.5 MG TABLET    Take 0.5 mg  by mouth 3 (three) times daily as needed for anxiety.   MUPIROCIN OINTMENT (BACTROBAN) 2 %    Apply 1 application topically 2 (two) times daily.   NYSTATIN (MYCOSTATIN) 100000 UNIT/ML SUSPENSION    Take 5 mLs by mouth 4 (four) times daily as needed. For mouth sores.   NYSTATIN (MYCOSTATIN/NYSTOP) POWDER    Apply 1 application topically 2 (two) times daily as needed. For yeast around skin folds   OMEPRAZOLE (PRILOSEC) 40 MG  CAPSULE    Take 1 capsule (40 mg total) by mouth 2 (two) times daily. 30 min before food   ONDANSETRON (ZOFRAN-ODT) 4 MG DISINTEGRATING TABLET    Take 1 tablet (4 mg total) by mouth 2 (two) times daily as needed for nausea or vomiting.   OXYCODONE-ACETAMINOPHEN (PERCOCET) 10-325 MG TABLET    Take 1 tablet by mouth every 8 (eight) hours as needed for pain.   PANTOPRAZOLE (PROTONIX) 40 MG TABLET       PREGABALIN (LYRICA) 100 MG CAPSULE    Take 1 capsule (100 mg total) by mouth 3 (three) times daily.   PROMETHAZINE (PHENERGAN) 25 MG TABLET    Take 1 tablet (25 mg total) by mouth every 8 (eight) hours as needed for nausea or vomiting.   TAMSULOSIN (FLOMAX) 0.4 MG CAPS CAPSULE    Take 1 capsule (0.4 mg total) by mouth daily after supper.   TIOTROPIUM BROMIDE-OLODATEROL (STIOLTO RESPIMAT) 2.5-2.5 MCG/ACT AERS    Inhale 2 puffs into the lungs daily.   TRAZODONE (DESYREL) 100 MG TABLET    Take 1 tablet (100 mg total) by mouth at bedtime.   VALACYCLOVIR (VALTREX) 500 MG TABLET    Take 500 mg by mouth 3 (three) times daily.   VITAMIN D, ERGOCALCIFEROL, (DRISDOL) 50000 UNITS CAPS CAPSULE    Take 50,000 Units by mouth every 7 (seven) days.  Modified Medications   No medications on file  Discontinued Medications   No medications on file   ----------------------------------------------------------------------------------------------------------------------  Follow-up: Return in about 2 months (around 10/17/2018) for evaluation, med refill.    Molli Barrows, MD

## 2018-08-18 DIAGNOSIS — H01021 Squamous blepharitis right upper eyelid: Secondary | ICD-10-CM | POA: Diagnosis not present

## 2018-08-18 DIAGNOSIS — B0052 Herpesviral keratitis: Secondary | ICD-10-CM | POA: Diagnosis not present

## 2018-08-18 DIAGNOSIS — Z961 Presence of intraocular lens: Secondary | ICD-10-CM | POA: Diagnosis not present

## 2018-08-18 DIAGNOSIS — H17821 Peripheral opacity of cornea, right eye: Secondary | ICD-10-CM | POA: Diagnosis not present

## 2018-08-18 DIAGNOSIS — H01024 Squamous blepharitis left upper eyelid: Secondary | ICD-10-CM | POA: Diagnosis not present

## 2018-08-18 DIAGNOSIS — H01022 Squamous blepharitis right lower eyelid: Secondary | ICD-10-CM | POA: Diagnosis not present

## 2018-08-18 DIAGNOSIS — H01025 Squamous blepharitis left lower eyelid: Secondary | ICD-10-CM | POA: Diagnosis not present

## 2018-08-18 DIAGNOSIS — E119 Type 2 diabetes mellitus without complications: Secondary | ICD-10-CM | POA: Diagnosis not present

## 2018-08-18 DIAGNOSIS — H1859 Other hereditary corneal dystrophies: Secondary | ICD-10-CM | POA: Diagnosis not present

## 2018-08-18 DIAGNOSIS — H04123 Dry eye syndrome of bilateral lacrimal glands: Secondary | ICD-10-CM | POA: Diagnosis not present

## 2018-08-23 DIAGNOSIS — N132 Hydronephrosis with renal and ureteral calculous obstruction: Secondary | ICD-10-CM | POA: Diagnosis not present

## 2018-08-23 DIAGNOSIS — N202 Calculus of kidney with calculus of ureter: Secondary | ICD-10-CM | POA: Diagnosis not present

## 2018-08-24 ENCOUNTER — Other Ambulatory Visit: Payer: Self-pay | Admitting: Urology

## 2018-08-24 ENCOUNTER — Telehealth: Payer: Self-pay | Admitting: Anesthesiology

## 2018-08-24 NOTE — Patient Instructions (Addendum)
[name]  08/24/2018   Your procedure is scheduled on: Thursday 08/26/2018  Report to [facility] Main  Entrance              Report to admitting at  1120  AM    Call this number if you have problems the morning of surgery [phone]    How to Manage Your Diabetes Before and After Surgery  Why is it important to control my blood sugar before and after surgery? . Improving blood sugar levels before and after surgery helps healing and can limit problems. . A way of improving blood sugar control is eating a healthy diet by: o  Eating less sugar and carbohydrates o  Increasing activity/exercise o  Talking with your doctor about reaching your blood sugar goals . High blood sugars (greater than 180 mg/dL) can raise your risk of infections and slow your recovery, so you will need to focus on controlling your diabetes during the weeks before surgery. . Make sure that the doctor who takes care of your diabetes knows about your planned surgery including the date and location.  How do I manage my blood sugar before surgery? . Check your blood sugar at least 4 times a day, starting 2 days before surgery, to make sure that the level is not too high or low. o Check your blood sugar the morning of your surgery when you wake up and every 2 hours until you get to the Short Stay unit. . If your blood sugar is less than 70 mg/dL, you will need to treat for low blood sugar: o Do not take insulin. o Treat a low blood sugar (less than 70 mg/dL) with  cup of clear juice (cranberry or apple), 4 glucose tablets, OR glucose gel. o Recheck blood sugar in 15 minutes after treatment (to make sure it is greater than 70 mg/dL). If your blood sugar is not greater than 70 mg/dL on recheck, call [phone] for further instructions. . Report your blood sugar to the short stay nurse when you get to Short Stay.  . If you are admitted to the hospital after surgery: o Your blood  sugar will be checked by the staff and you will probably be given insulin after surgery (instead of oral diabetes medicines) to make sure you have good blood sugar levels. o The goal for blood sugar control after surgery is 80-180 mg/dL.   WHAT DO I DO ABOUT MY DIABETES MEDICATION?  Marland Kitchen Do not take oral diabetes medicines (pills) the morning of surgery.  . THE NIGHT BEFORE SURGERY (bedtime dose), take 0    units of Humulin R-U 500      insulin.       . THE MORNING OF SURGERY, take 0  units of   Humulin R-U 500     insulin.  . Check blood glucose morning of surgery and if above 200, call Short Stay at [phone] as you may have to come in earlier to the hospital!        [name] ENDOCRINOLOGIST ABOUT THESE INSTRUCTIONS FOR YOUR INSULIN AND IF HE AGREES WITH THESE INSTRUCTIONS !      Remember: Do not eat food :After Midnight.May have clear liquids from midnight up until 0720 am then nothing until after surgery!    CLEAR LIQUID DIET   Foods Allowed  Foods Excluded  Coffee and tea, regular and decaf                             liquids that you cannot  Plain Jell-O in any flavor                                             see through such as: Fruit ices (not with fruit pulp)                                     milk, soups, orange juice  Iced Popsicles                                    All solid food Carbonated beverages, regular and diet                                    Cranberry, grape and apple juices Sports drinks like Gatorade Lightly seasoned clear broth or consume(fat free) Sugar, honey syrup  Sample Menu Breakfast                                Lunch                                     Supper Cranberry juice                    Beef broth                            Chicken broth Jell-O                                     Grape juice                           Apple juice Coffee or tea                         Jell-O                                      Popsicle                                                Coffee or tea                        Coffee or tea  _____________________________________________________________________                BRUSH YOUR TEETH MORNING OF SURGERY AND RINSE  YOUR MOUTH OUT, NO CHEWING GUM CANDY OR MINTS.     Take these medicines the morning of surgery with A SIP OF WATER: Oxycodone, Pantoprazole (Protonix), Pregabalin (Lyrica), use Albuterol inhaler if needed and bring inhaler with you to the hospital.   DO NOT TAKE ANY DIABETIC MEDICATIONS DAY OF YOUR SURGERY                               You may not have any metal on your body including hair pins and              piercings  Do not wear jewelry, make-up, lotions, powders or perfumes, deodorant                        Men may shave face and neck.   Do not bring valuables to the hospital. [place].  Contacts, dentures or bridgework may not be worn into surgery.  Leave suitcase in the car. After surgery it may be brought to your room.     Patients discharged the day of surgery will not be allowed to drive home.  Name and phone number of your driver:spouse- [name]               Please read over the following fact sheets you were given: _____________________________________________________________________             [facility] - Preparing for Surgery Before surgery, you can play an important role.  Because skin is not sterile, your skin needs to be as free of germs as possible.  You can reduce the number of germs on your skin by washing with CHG (chlorahexidine gluconate) soap before surgery.  CHG is an antiseptic cleaner which kills germs and bonds with the skin to continue killing germs even after washing. Please DO NOT use if you have an allergy to CHG or antibacterial soaps.  If your skin becomes reddened/irritated stop using the CHG and inform your  nurse when you arrive at Short Stay. Do not shave (including legs and underarms) for at least 48 hours prior to the first CHG shower.  You may shave your face/neck. Please follow these instructions carefully:  1.  Shower with CHG Soap the night before surgery and the  morning of Surgery.  2.  If you choose to wash your hair, wash your hair first as usual with your  normal  shampoo.  3.  After you shampoo, rinse your hair and body thoroughly to remove the  shampoo.                           4.  Use CHG as you would any other liquid soap.  You can apply chg directly  to the skin and wash                       Gently with a scrungie or clean washcloth.  5.  Apply the CHG Soap to your body ONLY FROM THE NECK DOWN.   Do not use on face/ open                           Wound or open sores. Avoid contact with eyes, ears mouth  and genitals (private parts).                       Wash face,  Genitals (private parts) with your normal soap.             6.  Wash thoroughly, paying special attention to the area where your surgery  will be performed.  7.  Thoroughly rinse your body with warm water from the neck down.  8.  DO NOT shower/wash with your normal soap after using and rinsing off  the CHG Soap.                9.  Pat yourself dry with a clean towel.            10.  Wear clean pajamas.            11.  Place clean sheets on your bed the night of your first shower and do not  sleep with pets. Day of Surgery : Do not apply any lotions/deodorants the morning of surgery.  Please wear clean clothes to the hospital/surgery center.  FAILURE TO FOLLOW THESE INSTRUCTIONS MAY RESULT IN THE CANCELLATION OF YOUR SURGERY PATIENT SIGNATURE_________________________________  NURSE SIGNATURE__________________________________  ________________________________________________________________________

## 2018-08-24 NOTE — Telephone Encounter (Signed)
pts wife called and wanted to let us know that the pt is having surgery Wednesday for kidney stones and they will be prescribing pain medication.

## 2018-08-24 NOTE — Progress Notes (Signed)
04/29/2018- noted in Epic-CXR  04/02/2018- noted in [facility]- HgA1C-7.8  06/04/2017- noted in Epic- Stress test  12/07/2015- noted in Epic-ECHO

## 2018-08-25 ENCOUNTER — Encounter (HOSPITAL_COMMUNITY)
Admission: RE | Admit: 2018-08-25 | Discharge: 2018-08-25 | Disposition: A | Discharge status: Home / Self Care | Payer: 59 | Source: Ambulatory Visit | Attending: Urology | Admitting: Urology

## 2018-08-25 ENCOUNTER — Encounter (HOSPITAL_COMMUNITY): Payer: Self-pay

## 2018-08-25 ENCOUNTER — Other Ambulatory Visit: Payer: Self-pay

## 2018-08-25 DIAGNOSIS — Z87442 Personal history of urinary calculi: Secondary | ICD-10-CM | POA: Diagnosis not present

## 2018-08-25 DIAGNOSIS — Z01818 Encounter for other preprocedural examination: Secondary | ICD-10-CM

## 2018-08-25 DIAGNOSIS — Z993 Dependence on wheelchair: Secondary | ICD-10-CM | POA: Diagnosis not present

## 2018-08-25 DIAGNOSIS — E119 Type 2 diabetes mellitus without complications: Secondary | ICD-10-CM

## 2018-08-25 DIAGNOSIS — E1151 Type 2 diabetes mellitus with diabetic peripheral angiopathy without gangrene: Secondary | ICD-10-CM | POA: Diagnosis not present

## 2018-08-25 DIAGNOSIS — Z85828 Personal history of other malignant neoplasm of skin: Secondary | ICD-10-CM | POA: Diagnosis not present

## 2018-08-25 DIAGNOSIS — J449 Chronic obstructive pulmonary disease, unspecified: Secondary | ICD-10-CM | POA: Diagnosis not present

## 2018-08-25 DIAGNOSIS — K219 Gastro-esophageal reflux disease without esophagitis: Secondary | ICD-10-CM | POA: Diagnosis not present

## 2018-08-25 DIAGNOSIS — Z87891 Personal history of nicotine dependence: Secondary | ICD-10-CM | POA: Diagnosis not present

## 2018-08-25 DIAGNOSIS — G4730 Sleep apnea, unspecified: Secondary | ICD-10-CM | POA: Diagnosis not present

## 2018-08-25 DIAGNOSIS — N201 Calculus of ureter: Secondary | ICD-10-CM | POA: Diagnosis not present

## 2018-08-25 DIAGNOSIS — I5032 Chronic diastolic (congestive) heart failure: Secondary | ICD-10-CM | POA: Diagnosis not present

## 2018-08-25 DIAGNOSIS — Z89511 Acquired absence of right leg below knee: Secondary | ICD-10-CM | POA: Diagnosis not present

## 2018-08-25 DIAGNOSIS — I2510 Atherosclerotic heart disease of native coronary artery without angina pectoris: Secondary | ICD-10-CM | POA: Diagnosis not present

## 2018-08-25 DIAGNOSIS — K3184 Gastroparesis: Secondary | ICD-10-CM | POA: Diagnosis not present

## 2018-08-25 DIAGNOSIS — I110 Hypertensive heart disease with heart failure: Secondary | ICD-10-CM | POA: Diagnosis not present

## 2018-08-25 LAB — CBC
HCT: 37.2 % — ABNORMAL LOW (ref 39.0–52.0)
HEMOGLOBIN: 12.4 g/dL — AB (ref 13.0–17.0)
MCH: 34.8 pg — ABNORMAL HIGH (ref 26.0–34.0)
MCHC: 33.3 g/dL (ref 30.0–36.0)
MCV: 104.5 fL — ABNORMAL HIGH (ref 80.0–100.0)
Platelets: 264 10*3/uL (ref 150–400)
RBC: 3.56 MIL/uL — ABNORMAL LOW (ref 4.22–5.81)
RDW: 14.7 % (ref 11.5–15.5)
WBC: 5.2 10*3/uL (ref 4.0–10.5)
nRBC: 0 % (ref 0.0–0.2)

## 2018-08-25 LAB — BASIC METABOLIC PANEL
ANION GAP: 9 (ref 5–15)
BUN: 16 mg/dL (ref 8–23)
CO2: 25 mmol/L (ref 22–32)
CREATININE: 1.09 mg/dL (ref 0.61–1.24)
Calcium: 10.5 mg/dL — ABNORMAL HIGH (ref 8.9–10.3)
Chloride: 104 mmol/L (ref 98–111)
GFR calc non Af Amer: >60 mL/min (ref >60)
Glucose, Bld: 232 mg/dL — ABNORMAL HIGH (ref 70–99)
Potassium: 3.7 mmol/L (ref 3.5–5.1)
Sodium: 138 mmol/L (ref 135–145)

## 2018-08-25 LAB — HEMOGLOBIN A1C
Hgb A1c MFr Bld: 6.7 % — ABNORMAL HIGH (ref 4.8–5.6)
Mean Plasma Glucose: 145.59 mg/dL

## 2018-08-25 LAB — GLUCOSE, CAPILLARY: Glucose-Capillary: 294 mg/dL — ABNORMAL HIGH (ref 70–99)

## 2018-08-25 NOTE — Progress Notes (Addendum)
Called Adah Perl, Diabetes Coordinator about patient coming in for surgery and is on U-500 insulin. She had reviewed the patient's chart and informed me to add to my instructions for patient to not take his bedtime dose of insulin, Do not take insulin the day of surgery. Also to please check his blood glucose the morning of surgery and if the blood glucose is over 200 , to call Short Stay Department and inform the nurse as Anesthesia may want him to come him earlier . Also have patient to call their Endocrinologist and inform them of these instructions.

## 2018-08-25 NOTE — Progress Notes (Signed)
Called and left message for Hiliary Blackwood,NP at [facility] to call me back as I wanted to let her know the instructions that the Diabetic Coordinator gave me for patient's surgery tomorrow 08/26/2018. Family was instructed to call Hiliary Blackwood,NP and let her know instructions for insulin administration for surgery on 08/26/2018

## 2018-08-26 ENCOUNTER — Encounter (HOSPITAL_COMMUNITY): Payer: Self-pay | Admitting: Registered Nurse

## 2018-08-26 ENCOUNTER — Ambulatory Visit (HOSPITAL_COMMUNITY)
Admission: RE | Admit: 2018-08-26 | Discharge: 2018-08-26 | Disposition: A | Discharge status: Home / Self Care | Payer: 59 | Attending: Urology | Admitting: Urology

## 2018-08-26 ENCOUNTER — Encounter (HOSPITAL_COMMUNITY)
Admission: RE | Disposition: A | Discharge status: Home / Self Care | Payer: Self-pay | Source: Home / Self Care | Attending: Urology

## 2018-08-26 ENCOUNTER — Ambulatory Visit (HOSPITAL_COMMUNITY): Payer: 59 | Admitting: Anesthesiology

## 2018-08-26 ENCOUNTER — Ambulatory Visit (HOSPITAL_COMMUNITY): Payer: 59

## 2018-08-26 DIAGNOSIS — N201 Calculus of ureter: Secondary | ICD-10-CM | POA: Insufficient documentation

## 2018-08-26 DIAGNOSIS — K3184 Gastroparesis: Secondary | ICD-10-CM | POA: Diagnosis not present

## 2018-08-26 DIAGNOSIS — G894 Chronic pain syndrome: Secondary | ICD-10-CM | POA: Diagnosis not present

## 2018-08-26 DIAGNOSIS — I5032 Chronic diastolic (congestive) heart failure: Secondary | ICD-10-CM | POA: Insufficient documentation

## 2018-08-26 DIAGNOSIS — G4730 Sleep apnea, unspecified: Secondary | ICD-10-CM | POA: Diagnosis not present

## 2018-08-26 DIAGNOSIS — I10 Essential (primary) hypertension: Secondary | ICD-10-CM | POA: Diagnosis not present

## 2018-08-26 DIAGNOSIS — I2510 Atherosclerotic heart disease of native coronary artery without angina pectoris: Secondary | ICD-10-CM | POA: Insufficient documentation

## 2018-08-26 DIAGNOSIS — I110 Hypertensive heart disease with heart failure: Secondary | ICD-10-CM | POA: Insufficient documentation

## 2018-08-26 DIAGNOSIS — Z85828 Personal history of other malignant neoplasm of skin: Secondary | ICD-10-CM | POA: Insufficient documentation

## 2018-08-26 DIAGNOSIS — Z89511 Acquired absence of right leg below knee: Secondary | ICD-10-CM | POA: Insufficient documentation

## 2018-08-26 DIAGNOSIS — Z993 Dependence on wheelchair: Secondary | ICD-10-CM | POA: Insufficient documentation

## 2018-08-26 DIAGNOSIS — J449 Chronic obstructive pulmonary disease, unspecified: Secondary | ICD-10-CM | POA: Insufficient documentation

## 2018-08-26 DIAGNOSIS — K219 Gastro-esophageal reflux disease without esophagitis: Secondary | ICD-10-CM | POA: Insufficient documentation

## 2018-08-26 DIAGNOSIS — E1151 Type 2 diabetes mellitus with diabetic peripheral angiopathy without gangrene: Secondary | ICD-10-CM | POA: Insufficient documentation

## 2018-08-26 DIAGNOSIS — Z87891 Personal history of nicotine dependence: Secondary | ICD-10-CM | POA: Insufficient documentation

## 2018-08-26 DIAGNOSIS — Z87442 Personal history of urinary calculi: Secondary | ICD-10-CM | POA: Insufficient documentation

## 2018-08-26 DIAGNOSIS — N200 Calculus of kidney: Secondary | ICD-10-CM

## 2018-08-26 HISTORY — PX: HOLMIUM LASER APPLICATION: SHX5852

## 2018-08-26 HISTORY — PX: CYSTOSCOPY WITH RETROGRADE PYELOGRAM, URETEROSCOPY AND STENT PLACEMENT: SHX5789

## 2018-08-26 LAB — GLUCOSE, CAPILLARY: Glucose-Capillary: 222 mg/dL — ABNORMAL HIGH (ref 70–99)

## 2018-08-26 SURGERY — CYSTOURETEROSCOPY, WITH RETROGRADE PYELOGRAM AND STENT INSERTION
Anesthesia: General | Site: Ureter | Laterality: Left

## 2018-08-26 MED ORDER — FENTANYL CITRATE (PF) 100 MCG/2ML IJ SOLN
50.0000 ug | Freq: Once | INTRAMUSCULAR | Status: AC
  Administered 2018-08-26: 50 ug via INTRAVENOUS
  Filled 2018-08-26: qty 2

## 2018-08-26 MED ORDER — ONDANSETRON HCL 4 MG/2ML IJ SOLN: 4.0000 mg | Freq: Once | INTRAMUSCULAR | Status: DC | PRN

## 2018-08-26 MED ORDER — CEFAZOLIN SODIUM-DEXTROSE 2-4 GM/100ML-% IV SOLN
2.0000 g | INTRAVENOUS | Status: AC
  Administered 2018-08-26: 2 g via INTRAVENOUS
  Filled 2018-08-26: qty 100

## 2018-08-26 MED ORDER — OXYCODONE HCL 10 MG PO TABS: 20.0000 mg | ORAL_TABLET | ORAL | 0 refills | Status: DC | PRN

## 2018-08-26 MED ORDER — ONDANSETRON HCL 4 MG/2ML IJ SOLN
INTRAMUSCULAR | Status: AC
  Filled 2018-08-26: qty 2

## 2018-08-26 MED ORDER — FENTANYL CITRATE (PF) 100 MCG/2ML IJ SOLN
INTRAMUSCULAR | Status: AC
  Filled 2018-08-26: qty 2

## 2018-08-26 MED ORDER — PROPOFOL 10 MG/ML IV BOLUS
INTRAVENOUS | Status: AC
  Filled 2018-08-26: qty 20

## 2018-08-26 MED ORDER — OXYCODONE HCL 5 MG/5ML PO SOLN
5.0000 mg | Freq: Once | ORAL | Status: AC | PRN
  Filled 2018-08-26: qty 5

## 2018-08-26 MED ORDER — SUCCINYLCHOLINE CHLORIDE 200 MG/10ML IV SOSY
PREFILLED_SYRINGE | INTRAVENOUS | Status: AC
  Filled 2018-08-26: qty 10

## 2018-08-26 MED ORDER — LIDOCAINE 2% (20 MG/ML) 5 ML SYRINGE
INTRAMUSCULAR | Status: AC
  Filled 2018-08-26: qty 5

## 2018-08-26 MED ORDER — SUCCINYLCHOLINE CHLORIDE 200 MG/10ML IV SOSY
PREFILLED_SYRINGE | INTRAVENOUS | Status: DC | PRN
  Administered 2018-08-26: 200 mg via INTRAVENOUS

## 2018-08-26 MED ORDER — PROPOFOL 10 MG/ML IV BOLUS
INTRAVENOUS | Status: DC | PRN
  Administered 2018-08-26: 200 mg via INTRAVENOUS

## 2018-08-26 MED ORDER — MEPERIDINE HCL 50 MG/ML IJ SOLN: 6.2500 mg | INTRAMUSCULAR | Status: DC | PRN

## 2018-08-26 MED ORDER — TAMSULOSIN HCL 0.4 MG PO CAPS: 0.4000 mg | ORAL_CAPSULE | Freq: Every day | ORAL | 0 refills | Status: AC

## 2018-08-26 MED ORDER — FENTANYL CITRATE (PF) 250 MCG/5ML IJ SOLN
INTRAMUSCULAR | Status: DC | PRN
  Administered 2018-08-26: 50 ug via INTRAVENOUS
  Administered 2018-08-26: 100 ug via INTRAVENOUS

## 2018-08-26 MED ORDER — MIDAZOLAM HCL 5 MG/5ML IJ SOLN
INTRAMUSCULAR | Status: DC | PRN
  Administered 2018-08-26: 2 mg via INTRAVENOUS

## 2018-08-26 MED ORDER — MIDAZOLAM HCL 2 MG/2ML IJ SOLN
INTRAMUSCULAR | Status: AC
  Filled 2018-08-26: qty 2

## 2018-08-26 MED ORDER — LIDOCAINE 2% (20 MG/ML) 5 ML SYRINGE
INTRAMUSCULAR | Status: DC | PRN
  Administered 2018-08-26: 100 mg via INTRAVENOUS

## 2018-08-26 MED ORDER — FENTANYL CITRATE (PF) 100 MCG/2ML IJ SOLN: 25.0000 ug | INTRAMUSCULAR | Status: DC | PRN

## 2018-08-26 MED ORDER — DEXAMETHASONE SODIUM PHOSPHATE 10 MG/ML IJ SOLN
INTRAMUSCULAR | Status: AC
  Filled 2018-08-26: qty 1

## 2018-08-26 MED ORDER — ONDANSETRON HCL 4 MG/2ML IJ SOLN
INTRAMUSCULAR | Status: DC | PRN
  Administered 2018-08-26: 4 mg via INTRAVENOUS

## 2018-08-26 MED ORDER — OXYCODONE HCL 5 MG PO TABS
ORAL_TABLET | ORAL | Status: AC
  Filled 2018-08-26: qty 2

## 2018-08-26 MED ORDER — IOHEXOL 300 MG/ML  SOLN
INTRAMUSCULAR | Status: DC | PRN
  Administered 2018-08-26: 2 mL

## 2018-08-26 MED ORDER — OXYCODONE HCL 5 MG PO TABS
10.0000 mg | ORAL_TABLET | Freq: Once | ORAL | Status: AC | PRN
  Administered 2018-08-26: 10 mg via ORAL

## 2018-08-26 MED ORDER — LACTATED RINGERS IV SOLN
INTRAVENOUS | Status: DC
  Administered 2018-08-26: 12:00:00 via INTRAVENOUS

## 2018-08-26 MED ORDER — SODIUM CHLORIDE 0.9 % IR SOLN
Status: DC | PRN
  Administered 2018-08-26: 6000 mL

## 2018-08-26 SURGICAL SUPPLY — 23 items
BAG URO CATCHER STRL LF (MISCELLANEOUS) ×3 IMPLANT
CATH INTERMIT  6FR 70CM (CATHETERS) ×3 IMPLANT
CLOTH BEACON ORANGE TIMEOUT ST (SAFETY) ×3 IMPLANT
COVER SURGICAL LIGHT HANDLE (MISCELLANEOUS) ×3 IMPLANT
COVER WAND RF STERILE (DRAPES) IMPLANT
EXTRACTOR STONE NITINOL NGAGE (UROLOGICAL SUPPLIES) ×2 IMPLANT
FIBER LASER FLEXIVA 1000 (UROLOGICAL SUPPLIES) IMPLANT
FIBER LASER FLEXIVA 365 (UROLOGICAL SUPPLIES) IMPLANT
FIBER LASER FLEXIVA 550 (UROLOGICAL SUPPLIES) IMPLANT
FIBER LASER TRAC TIP (UROLOGICAL SUPPLIES) ×4 IMPLANT
GLOVE BIO SURGEON STRL SZ8 (GLOVE) ×3 IMPLANT
GOWN STRL REUS W/TWL XL LVL3 (GOWN DISPOSABLE) ×3 IMPLANT
GUIDEWIRE ANG ZIPWIRE 038X150 (WIRE) ×3 IMPLANT
GUIDEWIRE STR DUAL SENSOR (WIRE) IMPLANT
IV NS 1000ML (IV SOLUTION) ×3
IV NS 1000ML BAXH (IV SOLUTION) ×1 IMPLANT
MANIFOLD NEPTUNE II (INSTRUMENTS) ×3 IMPLANT
PACK CYSTO (CUSTOM PROCEDURE TRAY) ×3 IMPLANT
SHEATH URETERAL 12FRX35CM (MISCELLANEOUS) ×2 IMPLANT
STENT URET 6FRX26 CONTOUR (STENTS) ×2 IMPLANT
TUBE FEEDING 8FR 16IN STR KANG (MISCELLANEOUS) IMPLANT
TUBING CONNECTING 10 (TUBING) ×2 IMPLANT
TUBING CONNECTING 10' (TUBING) ×1
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
## 2018-08-26 NOTE — Progress Notes (Signed)
Pt's wife requested different pain prescription than what was written by Dr. Alyson Ingles; pt is on routine pain medicine and is followed by [facility]; per pt's wife, pain med would not last until prescription that is written by Dr. Alyson Ingles; Dr. Alyson Ingles paged to make him aware and this nurse was going to allow pt's wife to speak with him to explain situation; pt and wife both decided to leave hospital and would contact Dr. Alyson Ingles later; nurse offered to re-page physician but pt stated he wanted to go home; pt discharged via wheelchair to wife's car

## 2018-08-26 NOTE — Progress Notes (Signed)
Dr. Alyson Ingles returned call; attempted to explain what patient's wife was requesting about pain prescription; he stated he would call wife at home

## 2018-08-26 NOTE — Anesthesia Procedure Notes (Signed)
Procedure Name: Intubation Date/Time: 08/26/2018 1:42 PM Performed by: Talbot Grumbling, CRNA Pre-anesthesia Checklist: Suction available, Emergency Drugs available, Patient being monitored and Patient identified Patient Re-evaluated:Patient Re-evaluated prior to induction Oxygen Delivery Method: Circle system utilized Preoxygenation: Pre-oxygenation with 100% oxygen Induction Type: IV induction, Cricoid Pressure applied and Rapid sequence Laryngoscope Size: Glidescope (LoPro 3) Grade View: Grade II Tube type: Oral Tube size: 7.5 mm Number of attempts: 1 Airway Equipment and Method: Stylet and Video-laryngoscopy Placement Confirmation: ETT inserted through vocal cords under direct vision,  positive ETCO2 and breath sounds checked- equal and bilateral Secured at: 22 cm Tube secured with: Tape Dental Injury: Teeth and Oropharynx as per pre-operative assessment

## 2018-08-26 NOTE — Addendum Note (Signed)
Addendum  created 08/26/18 1610 by Freddrick March, MD   Order list changed, Order sets accessed

## 2018-08-26 NOTE — Anesthesia Postprocedure Evaluation (Signed)
Anesthesia Post Note  Patient: [name]  Procedure(s) Performed: CYSTOSCOPY WITH RETROGRADE PYELOGRAM, URETEROSCOPY AND STENT PLACEMENT (Left Ureter) HOLMIUM LASER APPLICATION (Left Ureter)     Patient location during evaluation: PACU Anesthesia Type: General Level of consciousness: awake and alert Pain management: pain level controlled Vital Signs Assessment: post-procedure vital signs reviewed and stable Respiratory status: spontaneous breathing, nonlabored ventilation, respiratory function stable and patient connected to nasal cannula oxygen Cardiovascular status: blood pressure returned to baseline and stable Postop Assessment: no apparent nausea or vomiting Anesthetic complications: no    Last Vitals:  Vitals:   08/26/18 1233 08/26/18 1452  BP:  (!) 173/78  Pulse: 63 87  Resp: 13 20  Temp:  37 C  SpO2: 100% 100%    Last Pain:  Vitals:   08/26/18 1452  TempSrc:   PainSc: 0-No pain                 Artemisa Sladek DAVID

## 2018-08-26 NOTE — Anesthesia Preprocedure Evaluation (Signed)
Anesthesia Evaluation  Patient identified by MRN, date of birth, ID band Patient awake    Reviewed: Allergy & Precautions, NPO status , Patient's Chart, lab work & pertinent test results  History of Anesthesia Complications (+) DIFFICULT AIRWAY  Airway Mallampati: II  TM Distance: >3 FB Neck ROM: Full    Dental   Pulmonary sleep apnea , COPD, former smoker,    Pulmonary exam normal        Cardiovascular hypertension, Pt. on medications + CAD  Normal cardiovascular exam     Neuro/Psych Anxiety Depression    GI/Hepatic GERD  Medicated and Controlled,  Endo/Other  diabetes  Renal/GU      Musculoskeletal   Abdominal   Peds  Hematology   Anesthesia Other Findings   Reproductive/Obstetrics                             Anesthesia Physical Anesthesia Plan  ASA: III  Anesthesia Plan: General   Post-op Pain Management:    Induction: Intravenous  PONV Risk Score and Plan: Ondansetron and Midazolam  Airway Management Planned: LMA  Additional Equipment:   Intra-op Plan:   Post-operative Plan: Extubation in OR  Informed Consent: I have reviewed the patients History and Physical, chart, labs and discussed the procedure including the risks, benefits and alternatives for the proposed anesthesia with the patient or authorized representative who has indicated his/her understanding and acceptance.     Plan Discussed with: CRNA and Surgeon  Anesthesia Plan Comments:         Anesthesia Quick Evaluation

## 2018-08-26 NOTE — Brief Op Note (Signed)
08/26/2018  2:43 PM  PATIENT:  [name] III  67 y.o. male  PRE-OPERATIVE DIAGNOSIS:  LEFT PROXIMAL STONE  POST-OPERATIVE DIAGNOSIS:  left proximal stone  PROCEDURE:  Procedure(s) with comments: CYSTOSCOPY WITH RETROGRADE PYELOGRAM, URETEROSCOPY AND STENT PLACEMENT (Left) - 1 HR HOLMIUM LASER APPLICATION (Left)  SURGEON:  Surgeon(s) and Role:    * Jenniferlynn Saad, Candee Furbish, MD - Primary  PHYSICIAN ASSISTANT:   ASSISTANTS: none   ANESTHESIA:   general  EBL:  5 mL   BLOOD ADMINISTERED:none  DRAINS: left 6x26 JJ ureteral stent with tether   LOCAL MEDICATIONS USED:  NONE  SPECIMEN:  Source of Specimen:  stone  DISPOSITION OF SPECIMEN:  N/A  COUNTS:  YES  TOURNIQUET:  * No tourniquets in log *  DICTATION: .Note written in EPIC  PLAN OF CARE: Discharge to home after PACU  PATIENT DISPOSITION:  PACU - hemodynamically stable.   Delay start of Pharmacological VTE agent (>24hrs) due to surgical blood loss or risk of bleeding: not applicable

## 2018-08-26 NOTE — Discharge Instructions (Signed)

## 2018-08-26 NOTE — Progress Notes (Signed)
PACU note; pt's blood sugar is 248; pt has device to check blood sugar by his phone

## 2018-08-26 NOTE — Transfer of Care (Signed)
Immediate Anesthesia Transfer of Care Note  Patient: [name]  Procedure(s) Performed: CYSTOSCOPY WITH RETROGRADE PYELOGRAM, URETEROSCOPY AND STENT PLACEMENT (Left Ureter) HOLMIUM LASER APPLICATION (Left Ureter)  Patient Location: PACU  Anesthesia Type:General  Level of Consciousness: sedated  Airway & Oxygen Therapy: Patient Spontanous Breathing and Patient connected to face mask oxygen  Post-op Assessment: Report given to RN and Post -op Vital signs reviewed and stable  Post vital signs: Reviewed and stable  Last Vitals:  Vitals Value Taken Time  BP    Temp    Pulse 87 08/26/2018  2:52 PM  Resp 20 08/26/2018  2:52 PM  SpO2 100 % 08/26/2018  2:52 PM  Vitals shown include unvalidated device data.  Last Pain:  Vitals:   08/26/18 1234  TempSrc:   PainSc: 6       Patients Stated Pain Goal: 8 (82/99/37 1696)  Complications: No apparent anesthesia complications

## 2018-08-27 ENCOUNTER — Encounter (HOSPITAL_COMMUNITY): Payer: Self-pay | Admitting: Urology

## 2018-09-02 MED FILL — PREGABALIN 100 MG CAPS: 100 | 30 days supply | Qty: 90 | Fill #2

## 2018-09-02 MED FILL — PANTOPRAZOLE SOD DR 40 MG T: 40 | 30 days supply | Qty: 60 | Fill #1

## 2018-09-02 MED FILL — FUROSEMIDE 40 MG TAB: 40 | 90 days supply | Qty: 90 | Fill #1

## 2018-09-02 NOTE — Op Note (Signed)
.  Preoperative diagnosis: Left ureteral stone  Postoperative diagnosis: Same  Procedure: 1 cystoscopy 2. Left retrograde pyelography 3.  Intraoperative fluoroscopy, under one hour, with interpretation 4.  Left ureteroscopic stone manipulation with laser lithotripsy 5.  Left 6 x 26 JJ stent placement  Attending: Rosie Fate  Anesthesia: General  Estimated blood loss: None  Drains: Left 6 x 26 JJ ureteral stent without tether  Specimens: stone for analysis  Antibiotics: ancef  Findings: left lower pole stone, left renal pelvis stone. No hydronephrosis. No masses/lesions in the bladder. Ureteral orifices in normal anatomic location.  Indications: Patient is a 67 year old male with a history of left renal and ureteral stones and who has persistent left flank pain.  After discussing treatment options, she decided proceed with left ureteroscopic stone manipulation.  Procedure her in detail: The patient was brought to the operating room and a brief timeout was done to ensure correct patient, correct procedure, correct site.  General anesthesia was administered patient was placed in dorsal lithotomy position.  Her genitalia was then prepped and draped in usual sterile fashion.  A rigid 10 French cystoscope was passed in the urethra and the bladder.  Bladder was inspected free masses or lesions.  the ureteral orifices were in the normal orthotopic locations.  a 6 french ureteral catheter was then instilled into the left ureteral orifice.  a gentle retrograde was obtained and findings noted above.  we then placed a zip wire through the ureteral catheter and advanced up to the renal pelvis.  we then removed the cystoscope and cannulated the left ureteral orifice with a semirigid ureteroscope.  No stone was found in the ureter. Once we reached the UPJ a sensor wire was advanced in to the renal pelvis. We then removed the ureteroscope and advanced am 12/14 x 38cm access sheath up to the renal  pelvis. We then used the flexible ureteroscope to perform nephroscopy. We encountered the stones in the lower pole and renal pelvis. Using a 200nm laser fiber the stone was fragmented and the fragments were then removed with a Ngage basket.    once all stone fragments were removed we then removed the access sheath under direct vision and noted no injury to the ureter. We then placed a 6 x 26 double-j ureteral stent over the original zip wire.  We then removed the wire and good coil was noted in the the renal pelvis under fluoroscopy and the bladder under direct vision. the bladder was then drained and this concluded the procedure which was well tolerated by patient.  Complications: None  Condition: Stable, extubated, transferred to PACU  Plan: Patient is to be discharged home as to follow-up in one week for stent removal.

## 2018-09-09 DIAGNOSIS — N202 Calculus of kidney with calculus of ureter: Secondary | ICD-10-CM | POA: Diagnosis not present

## 2018-09-09 DIAGNOSIS — N201 Calculus of ureter: Secondary | ICD-10-CM | POA: Diagnosis not present

## 2018-09-09 MED FILL — TAMSULOSIN HCL 0.4 MG CAP: 0.4 | 30 days supply | Qty: 30 | Fill #0

## 2018-09-10 ENCOUNTER — Telehealth: Payer: Self-pay

## 2018-09-10 ENCOUNTER — Other Ambulatory Visit: Payer: Self-pay | Admitting: Otolaryngology

## 2018-09-10 ENCOUNTER — Other Ambulatory Visit (HOSPITAL_COMMUNITY): Payer: Self-pay | Admitting: Otolaryngology

## 2018-09-10 DIAGNOSIS — R070 Pain in throat: Secondary | ICD-10-CM

## 2018-09-10 MED FILL — oxyCODONE HCL 10 MG TABS: 10 | 3 days supply | Qty: 36 | Fill #0

## 2018-09-10 NOTE — Telephone Encounter (Signed)
Patients wife called stating that he was having kidney stones and had gotten a script from [place] for Oxycodone 10 mg 2 tablets Q4 hours prn #36.  [facility] pharmacist, Benjamine Mola called to see if it could be filled.  Informed her that it could be filled because of the acute issue.  Informed [name] the wife not to take our medications when giving the script from [facility].  Wife states understanding.

## 2018-09-13 DIAGNOSIS — N201 Calculus of ureter: Secondary | ICD-10-CM | POA: Diagnosis not present

## 2018-09-14 ENCOUNTER — Ambulatory Visit (HOSPITAL_COMMUNITY)
Admission: RE | Admit: 2018-09-14 | Discharge: 2018-09-14 | Disposition: A | Discharge status: Home / Self Care | Payer: 59 | Source: Ambulatory Visit | Attending: Otolaryngology | Admitting: Otolaryngology

## 2018-09-14 DIAGNOSIS — R070 Pain in throat: Secondary | ICD-10-CM | POA: Insufficient documentation

## 2018-09-14 DIAGNOSIS — R1310 Dysphagia, unspecified: Secondary | ICD-10-CM | POA: Diagnosis not present

## 2018-09-14 MED ORDER — IOHEXOL 300 MG/ML  SOLN
75.0000 mL | Freq: Once | INTRAMUSCULAR | Status: AC | PRN
  Administered 2018-09-14: 75 mL via INTRAVENOUS

## 2018-09-14 MED ORDER — SODIUM CHLORIDE (PF) 0.9 % IJ SOLN
INTRAMUSCULAR | Status: AC
  Filled 2018-09-14: qty 50

## 2018-09-16 ENCOUNTER — Other Ambulatory Visit: Payer: Self-pay | Admitting: Urology

## 2018-09-17 ENCOUNTER — Encounter (HOSPITAL_COMMUNITY): Payer: Self-pay

## 2018-09-17 NOTE — Patient Instructions (Signed)
[place] III  09/17/2018   Your procedure is scheduled on: 09-23-18  Report to [facility] Main  Entrance  Report to admitting at        1030 AM    Call this number if you have problems the morning of surgery [phone]    Remember: Do not eat food or drink liquids :After Midnight.  BRUSH YOUR TEETH MORNING OF SURGERY AND RINSE YOUR MOUTH OUT, NO CHEWING GUM CANDY OR MINTS.     Take these medicines the morning of surgery with A SIP OF WATER: lyrica, protonix, xyzal, inhaler and bring, ativan and oxycodone if needed    , call PCP or endocrinologist regarding insulin u 500 day before and morning of surgery                                    You may not have any metal on your body including hair pins and              piercings  Do not wear jewelry,  lotions, powders or perfumes, deodorant                      Men may shave face and neck.   Do not bring valuables to the hospital. [place].  Contacts, dentures or bridgework may not be worn into surgery.  Leave suitcase in the car. After surgery it may be brought to your room.     Patients discharged the day of surgery will not be allowed to drive home. IF YOU ARE HAVING SURGERY AND GOING HOME THE SAME DAY, YOU MUST HAVE AN ADULT TO DRIVE YOU HOME AND BE WITH YOU FOR 24 HOURS. YOU MAY GO HOME BY TAXI OR UBER OR ORTHERWISE, BUT AN ADULT MUST ACCOMPANY YOU HOME AND STAY WITH YOU FOR 24 HOURS.     Special Instructions: N/A              Please read over the following fact sheets you were given: _____________________________________________________________________             [facility] - Preparing for Surgery Before surgery, you can play an important role.  Because skin is not sterile, your skin needs to be as free of germs as possible.  You can reduce the number of germs on your skin by washing with CHG (chlorahexidine gluconate) soap before surgery.  CHG  is an antiseptic cleaner which kills germs and bonds with the skin to continue killing germs even after washing. Please DO NOT use if you have an allergy to CHG or antibacterial soaps.  If your skin becomes reddened/irritated stop using the CHG and inform your nurse when you arrive at Short Stay. Do not shave (including legs and underarms) for at least 48 hours prior to the first CHG shower.  You may shave your face/neck. Please follow these instructions carefully:  1.  Shower with CHG Soap the night before surgery and the  morning of Surgery.  2.  If you choose to wash your hair, wash your hair first as usual with your  normal  shampoo.  3.  After you shampoo, rinse your hair and body thoroughly to remove the  shampoo.                           4.  Use CHG as you would any other liquid soap.  You can apply chg directly  to the skin and wash                       Gently with a scrungie or clean washcloth.  5.  Apply the CHG Soap to your body ONLY FROM THE NECK DOWN.   Do not use on face/ open                           Wound or open sores. Avoid contact with eyes, ears mouth and genitals (private parts).                       Wash face,  Genitals (private parts) with your normal soap.             6.  Wash thoroughly, paying special attention to the area where your surgery  will be performed.  7.  Thoroughly rinse your body with warm water from the neck down.  8.  DO NOT shower/wash with your normal soap after using and rinsing off  the CHG Soap.                9.  Pat yourself dry with a clean towel.            10.  Wear clean pajamas.            11.  Place clean sheets on your bed the night of your first shower and do not  sleep with pets. Day of Surgery : Do not apply any lotions/deodorants the morning of surgery.  Please wear clean clothes to the hospital/surgery center.  FAILURE TO FOLLOW THESE INSTRUCTIONS MAY RESULT IN THE CANCELLATION OF YOUR SURGERY PATIENT  SIGNATURE_________________________________  NURSE SIGNATURE__________________________________  ________________________________________________________________________

## 2018-09-21 ENCOUNTER — Encounter (HOSPITAL_COMMUNITY)
Admission: RE | Admit: 2018-09-21 | Discharge: 2018-09-21 | Disposition: A | Discharge status: Home / Self Care | Payer: Medicare Other | Source: Ambulatory Visit | Attending: Urology | Admitting: Urology

## 2018-09-21 ENCOUNTER — Other Ambulatory Visit: Payer: Self-pay

## 2018-09-21 ENCOUNTER — Encounter (HOSPITAL_COMMUNITY): Payer: Self-pay

## 2018-09-21 DIAGNOSIS — Z89511 Acquired absence of right leg below knee: Secondary | ICD-10-CM | POA: Diagnosis not present

## 2018-09-21 DIAGNOSIS — I2510 Atherosclerotic heart disease of native coronary artery without angina pectoris: Secondary | ICD-10-CM | POA: Diagnosis not present

## 2018-09-21 DIAGNOSIS — Z8614 Personal history of Methicillin resistant Staphylococcus aureus infection: Secondary | ICD-10-CM | POA: Diagnosis not present

## 2018-09-21 DIAGNOSIS — E1151 Type 2 diabetes mellitus with diabetic peripheral angiopathy without gangrene: Secondary | ICD-10-CM | POA: Diagnosis not present

## 2018-09-21 DIAGNOSIS — Z01812 Encounter for preprocedural laboratory examination: Secondary | ICD-10-CM

## 2018-09-21 DIAGNOSIS — Z9049 Acquired absence of other specified parts of digestive tract: Secondary | ICD-10-CM | POA: Diagnosis not present

## 2018-09-21 DIAGNOSIS — Z85828 Personal history of other malignant neoplasm of skin: Secondary | ICD-10-CM | POA: Diagnosis not present

## 2018-09-21 DIAGNOSIS — R109 Unspecified abdominal pain: Secondary | ICD-10-CM | POA: Diagnosis present

## 2018-09-21 DIAGNOSIS — I10 Essential (primary) hypertension: Secondary | ICD-10-CM | POA: Diagnosis not present

## 2018-09-21 DIAGNOSIS — E1143 Type 2 diabetes mellitus with diabetic autonomic (poly)neuropathy: Secondary | ICD-10-CM | POA: Diagnosis not present

## 2018-09-21 DIAGNOSIS — J449 Chronic obstructive pulmonary disease, unspecified: Secondary | ICD-10-CM | POA: Diagnosis not present

## 2018-09-21 DIAGNOSIS — N200 Calculus of kidney: Secondary | ICD-10-CM | POA: Diagnosis not present

## 2018-09-21 DIAGNOSIS — Z8673 Personal history of transient ischemic attack (TIA), and cerebral infarction without residual deficits: Secondary | ICD-10-CM | POA: Diagnosis not present

## 2018-09-21 DIAGNOSIS — Z794 Long term (current) use of insulin: Secondary | ICD-10-CM | POA: Diagnosis not present

## 2018-09-21 DIAGNOSIS — K219 Gastro-esophageal reflux disease without esophagitis: Secondary | ICD-10-CM | POA: Diagnosis not present

## 2018-09-21 DIAGNOSIS — G4730 Sleep apnea, unspecified: Secondary | ICD-10-CM | POA: Diagnosis not present

## 2018-09-21 DIAGNOSIS — Z87891 Personal history of nicotine dependence: Secondary | ICD-10-CM | POA: Diagnosis not present

## 2018-09-21 DIAGNOSIS — K3184 Gastroparesis: Secondary | ICD-10-CM | POA: Diagnosis not present

## 2018-09-21 LAB — SURGICAL PCR SCREEN
MRSA, PCR: NEGATIVE
Staphylococcus aureus: POSITIVE — AB

## 2018-09-21 LAB — CBC
HEMATOCRIT: 37.3 % — AB (ref 39.0–52.0)
Hemoglobin: 12.3 g/dL — ABNORMAL LOW (ref 13.0–17.0)
MCH: 34.2 pg — ABNORMAL HIGH (ref 26.0–34.0)
MCHC: 33 g/dL (ref 30.0–36.0)
MCV: 103.6 fL — AB (ref 80.0–100.0)
Platelets: 253 10*3/uL (ref 150–400)
RBC: 3.6 MIL/uL — ABNORMAL LOW (ref 4.22–5.81)
RDW: 15.7 % — AB (ref 11.5–15.5)
WBC: 4.6 10*3/uL (ref 4.0–10.5)
nRBC: 0 % (ref 0.0–0.2)

## 2018-09-21 LAB — BASIC METABOLIC PANEL
Anion gap: 6 (ref 5–15)
BUN: 6 mg/dL — ABNORMAL LOW (ref 8–23)
CO2: 28 mmol/L (ref 22–32)
Calcium: 9.8 mg/dL (ref 8.9–10.3)
Chloride: 106 mmol/L (ref 98–111)
Creatinine, Ser: 0.94 mg/dL (ref 0.61–1.24)
GFR calc Af Amer: >60 mL/min (ref >60)
GFR calc non Af Amer: >60 mL/min (ref >60)
Glucose, Bld: 258 mg/dL — ABNORMAL HIGH (ref 70–99)
Potassium: 3.9 mmol/L (ref 3.5–5.1)
Sodium: 140 mmol/L (ref 135–145)

## 2018-09-21 NOTE — Anesthesia Preprocedure Evaluation (Addendum)
Anesthesia Evaluation  Patient identified by MRN, date of birth, ID band Patient awake    Reviewed: Allergy & Precautions, NPO status , Patient's Chart, lab work & pertinent test results  History of Anesthesia Complications (+) DIFFICULT AIRWAY and history of anesthetic complications  Airway Mallampati: II  TM Distance: >3 FB Neck ROM: Full    Dental  (+) Teeth Intact, Dental Advisory Given   Pulmonary sleep apnea , COPD,  COPD inhaler, former smoker,    Pulmonary exam normal breath sounds clear to auscultation       Cardiovascular hypertension, Pt. on medications + CAD and + Peripheral Vascular Disease  Normal cardiovascular exam Rhythm:Regular Rate:Normal  Nuclear stress 9/18: EF 66%, normal study   Neuro/Psych  Headaches, Anxiety Depression TIA   GI/Hepatic Neg liver ROS, hiatal hernia, GERD (Sleeps upright due to reflux)  Medicated and Poorly Controlled,S/P esophagectomy and gastric pull-through   Endo/Other  negative endocrine ROSdiabetes, Type 2, Insulin Dependent  Renal/GU Renal stones     Musculoskeletal  (+) Arthritis , S/P right BKA   Abdominal   Peds  Hematology negative hematology ROS (+) anemia ,   Anesthesia Other Findings Day of surgery medications reviewed with the patient.  Reproductive/Obstetrics                         Anesthesia Physical Anesthesia Plan  ASA: III  Anesthesia Plan: General   Post-op Pain Management:    Induction: Intravenous, Rapid sequence and Cricoid pressure planned  PONV Risk Score and Plan: 2 and Treatment may vary due to age or medical condition, Ondansetron and Midazolam  Airway Management Planned: Oral ETT and Video Laryngoscope Planned  Additional Equipment:   Intra-op Plan:   Post-operative Plan: Extubation in OR  Informed Consent: I have reviewed the patients History and Physical, chart, labs and discussed the procedure including  the risks, benefits and alternatives for the proposed anesthesia with the patient or authorized representative who has indicated his/her understanding and acceptance.   Dental advisory given  Plan Discussed with: CRNA  Anesthesia Plan Comments: (See PST note 09/21/2018, Konrad Felix, PA-C  Plan to ramp pt prior to induction. RSI with Glidescope.)      Anesthesia Quick Evaluation
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
## 2018-09-21 NOTE — Progress Notes (Signed)
ekg 08-25-18 epic [id number] 08-25-18 epic cxr 04-29-18 epic Stress test 06-04-17 epic

## 2018-09-22 ENCOUNTER — Ambulatory Visit (INDEPENDENT_AMBULATORY_CARE_PROVIDER_SITE_OTHER): Payer: Medicare Other | Admitting: Internal Medicine

## 2018-09-22 ENCOUNTER — Encounter: Payer: Self-pay | Admitting: Internal Medicine

## 2018-09-22 VITALS — BP 112/60 | HR 67 | Temp 98.1°F | Ht 73.0 in | Wt 272.0 lb

## 2018-09-22 DIAGNOSIS — I10 Essential (primary) hypertension: Secondary | ICD-10-CM

## 2018-09-22 DIAGNOSIS — G546 Phantom limb syndrome with pain: Secondary | ICD-10-CM | POA: Diagnosis not present

## 2018-09-22 DIAGNOSIS — N200 Calculus of kidney: Secondary | ICD-10-CM | POA: Diagnosis not present

## 2018-09-22 DIAGNOSIS — R1310 Dysphagia, unspecified: Secondary | ICD-10-CM | POA: Diagnosis not present

## 2018-09-22 MED ORDER — KETOROLAC TROMETHAMINE 60 MG/2ML IM SOLN
30.0000 mg | Freq: Once | INTRAMUSCULAR | Status: AC
  Administered 2018-09-22: 30 mg via INTRAMUSCULAR

## 2018-09-22 MED ORDER — LOSARTAN POTASSIUM 25 MG PO TABS: 12.5000 mg | ORAL_TABLET | Freq: Every day | ORAL | 2 refills | Status: DC

## 2018-09-22 MED FILL — LOSARTAN POTASSIUM 25 MG TA: 25 | 90 days supply | Qty: 45 | Fill #0

## 2018-09-22 NOTE — Patient Instructions (Addendum)
Cetaphil or Cerave creams daily even 2x per day   Lemon water for kidney stones  Simply Lemonade light   Consider dermatology referral in the future let me know     Dietary Guidelines to Help Prevent Kidney Stones Kidney stones are deposits of minerals and salts that form inside your kidneys. Your risk of developing kidney stones may be greater depending on your diet, your lifestyle, the medicines you take, and whether you have certain medical conditions. Most people can reduce their chances of developing kidney stones by following the instructions below. Depending on your overall health and the type of kidney stones you tend to develop, your dietitian may give you more specific instructions. What are tips for following this plan? Reading food labels  Choose foods with "no salt added" or "low-salt" labels. Limit your sodium intake to less than 1500 mg per day.  Choose foods with calcium for each meal and snack. Try to eat about 300 mg of calcium at each meal. Foods that contain 200-500 mg of calcium per serving include: ? 8 oz (237 ml) of milk, fortified nondairy milk, and fortified fruit juice. ? 8 oz (237 ml) of kefir, yogurt, and soy yogurt. ? 4 oz (118 ml) of tofu. ? 1 oz of cheese. ? 1 cup (300 g) of dried figs. ? 1 cup (91 g) of cooked broccoli. ? 1-3 oz can of sardines or mackerel.  Most people need 1000 to 1500 mg of calcium each day. Talk to your dietitian about how much calcium is recommended for you. Shopping  Buy plenty of fresh fruits and vegetables. Most people do not need to avoid fruits and vegetables, even if they contain nutrients that may contribute to kidney stones.  When shopping for convenience foods, choose: ? Whole pieces of fruit. ? Premade salads with dressing on the side. ? Low-fat fruit and yogurt smoothies.  Avoid buying frozen meals or prepared deli foods.  Look for foods with live cultures, such as yogurt and kefir. Cooking  Do not add salt to  food when cooking. Place a salt shaker on the table and allow each person to add his or her own salt to taste.  Use vegetable protein, such as beans, textured vegetable protein (TVP), or tofu instead of meat in pasta, casseroles, and soups. Meal planning   Eat less salt, if told by your dietitian. To do this: ? Avoid eating processed or premade food. ? Avoid eating fast food.  Eat less animal protein, including cheese, meat, poultry, or fish, if told by your dietitian. To do this: ? Limit the number of times you have meat, poultry, fish, or cheese each week. Eat a diet free of meat at least 2 days a week. ? Eat only one serving each day of meat, poultry, fish, or seafood. ? When you prepare animal protein, cut pieces into small portion sizes. For most meat and fish, one serving is about the size of one deck of cards.  Eat at least 5 servings of fresh fruits and vegetables each day. To do this: ? Keep fruits and vegetables on hand for snacks. ? Eat 1 piece of fruit or a handful of berries with breakfast. ? Have a salad and fruit at lunch. ? Have two kinds of vegetables at dinner.  Limit foods that are high in a substance called oxalate. These include: ? Spinach. ? Rhubarb. ? Beets. ? Potato chips and french fries. ? Nuts.  If you regularly take a diuretic medicine, make sure  to eat at least 1-2 fruits or vegetables high in potassium each day. These include: ? Avocado. ? Banana. ? Orange, prune, carrot, or tomato juice. ? Baked potato. ? Cabbage. ? Beans and split peas. General instructions   Drink enough fluid to keep your urine clear or pale yellow. This is the most important thing you can do.  Talk to your health care provider and dietitian about taking daily supplements. Depending on your health and the cause of your kidney stones, you may be advised: ? Not to take supplements with vitamin C. ? To take a calcium supplement. ? To take a daily probiotic supplement. ? To  take other supplements such as magnesium, fish oil, or vitamin B6.  Take all medicines and supplements as told by your health care provider.  Limit alcohol intake to no more than 1 drink a day for nonpregnant women and 2 drinks a day for men. One drink equals 12 oz of beer, 5 oz of wine, or 1 oz of hard liquor.  Lose weight if told by your health care provider. Work with your dietitian to find strategies and an eating plan that works best for you. What foods are not recommended? Limit your intake of the following foods, or as told by your dietitian. Talk to your dietitian about specific foods you should avoid based on the type of kidney stones and your overall health. Grains Breads. Bagels. Rolls. Baked goods. Salted crackers. Cereal. Pasta. Vegetables Spinach. Rhubarb. Beets. Canned vegetables. Angie Fava. Olives. Meats and other protein foods Nuts. Nut butters. Large portions of meat, poultry, or fish. Salted or cured meats. Deli meats. Hot dogs. Sausages. Dairy Cheese. Beverages Regular soft drinks. Regular vegetable juice. Seasonings and other foods Seasoning blends with salt. Salad dressings. Canned soups. Soy sauce. Ketchup. Barbecue sauce. Canned pasta sauce. Casseroles. Pizza. Lasagna. Frozen meals. Potato chips. [place] fries. Summary  You can reduce your risk of kidney stones by making changes to your diet.  The most important thing you can do is drink enough fluid. You should drink enough fluid to keep your urine clear or pale yellow.  Ask your health care provider or dietitian how much protein from animal sources you should eat each day, and also how much salt and calcium you should have each day. This information is not intended to replace advice given to you by your health care provider. Make sure you discuss any questions you have with your health care provider. Document Released: 12/27/2010 Document Revised: 08/12/2016 Document Reviewed: 08/12/2016 Elsevier Interactive  Patient Education  2019 [employer].  Kidney Stones  Kidney stones (urolithiasis) are solid, rock-like deposits that form inside of the organs that make urine (kidneys). A kidney stone may form in a kidney and move into the bladder, where it can cause intense pain and block the flow of urine. Kidney stones are created when high levels of certain minerals are found in the urine. They are usually passed through urination, but in some cases, medical treatment may be needed to remove them. What are the causes? Kidney stones may be caused by:  A condition in which certain glands produce too much parathyroid hormone (primary hyperparathyroidism), which causes too much calcium buildup in the blood.  Buildup of uric acid crystals in the bladder (hyperuricosuria). Uric acid is a chemical that the body produces when you eat certain foods. It usually exits the body in the urine.  Narrowing (stricture) of one or both of the tubes that drain urine from the kidneys to  the bladder (ureters).  A kidney blockage that is present at birth (congenital obstruction).  Past surgery on the kidney or the ureters, such as gastric bypass surgery. What increases the risk? The following factors make you more likely to develop kidney stones:  Having had a kidney stone in the past.  Having a family history of kidney stones.  Not drinking enough water.  Eating a diet that is high in protein, salt (sodium), or sugar.  Being overweight or obese. What are the signs or symptoms? Symptoms of a kidney stone may include:  Nausea.  Vomiting.  Blood in the urine (hematuria).  Pain in the side of the abdomen, right below the ribs (flank pain). Pain usually spreads (radiates) to the groin.  Needing to urinate frequently or urgently. How is this diagnosed? This condition may be diagnosed based on:  Your medical history.  A physical exam.  Blood tests.  Urine tests.  CT scan.  Abdominal X-ray.  A  procedure to examine the inside of the bladder (cystoscopy). How is this treated? Treatment for kidney stones depends on the size, location, and makeup of the stones. Treatment may involve:  Analyzing your urine before and after you pass the stone through urination.  Being monitored at the hospital until you pass the stone through urination.  Increasing your fluid intake and decreasing the amount of calcium and protein in your diet.  A procedure to break up kidney stones in the bladder using: ? A focused beam of light (laser therapy). ? Shock waves (extracorporeal shock wave lithotripsy).  Surgery to remove kidney stones. This may be needed if you have severe pain or have stones that block your urinary tract. Follow these instructions at home: Eating and drinking  Drink enough fluid to keep your urine clear or pale yellow. This will help you to pass the kidney stone.  If directed, change your diet. This may include: ? Limiting how much sodium you eat. ? Eating more fruits and vegetables. ? Limiting how much meat, poultry, fish, and eggs you eat.  Follow instructions from your health care provider about eating or drinking restrictions. General instructions  Collect urine samples as told by your health care provider. You may need to collect a urine sample: ? 24 hours after you pass the stone. ? 8-12 weeks after passing the kidney stone, and every 6-12 months after that.  Strain your urine every time you urinate, for as long as directed. Use the strainer that your health care provider recommends.  Do not throw out the kidney stone after passing it. Keep the stone so it can be tested by your health care provider. Testing the makeup of your kidney stone may help prevent you from getting kidney stones in the future.  Take over-the-counter and prescription medicines only as told by your health care provider.  Keep all follow-up visits as told by your health care provider. This is  important. You may need follow-up X-rays or ultrasounds to make sure that your stone has passed. How is this prevented? To prevent another kidney stone:  Drink enough fluid to keep your urine clear or pale yellow. This is the best way to prevent kidney stones.  Eat a healthy diet and follow recommendations from your health care provider about foods to avoid. You may be instructed to eat a low-protein diet. Recommendations vary depending on the type of kidney stone that you have.  Maintain a healthy weight. Contact a health care provider if:  You have pain  that gets worse or does not get better with medicine. Get help right away if:  You have a fever or chills.  You develop severe pain.  You develop new abdominal pain.  You faint.  You are unable to urinate. This information is not intended to replace advice given to you by your health care provider. Make sure you discuss any questions you have with your health care provider. Document Released: 09/01/2005 Document Revised: 02/12/2017 Document Reviewed: 02/15/2016 Elsevier Interactive Patient Education  2019 [employer].
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
## 2018-09-22 NOTE — Progress Notes (Signed)
Pre visit review using our clinic review tool, if applicable. No additional management support is needed unless otherwise documented below in the visit note. 

## 2018-09-23 ENCOUNTER — Ambulatory Visit (HOSPITAL_COMMUNITY): Payer: Medicare Other | Admitting: Anesthesiology

## 2018-09-23 ENCOUNTER — Ambulatory Visit (HOSPITAL_COMMUNITY): Payer: Medicare Other | Admitting: Physician Assistant

## 2018-09-23 ENCOUNTER — Ambulatory Visit (HOSPITAL_COMMUNITY): Payer: Medicare Other

## 2018-09-23 ENCOUNTER — Ambulatory Visit (HOSPITAL_COMMUNITY)
Admission: RE | Admit: 2018-09-23 | Discharge: 2018-09-23 | Disposition: A | Discharge status: Home / Self Care | Payer: Medicare Other | Attending: Urology | Admitting: Urology

## 2018-09-23 ENCOUNTER — Encounter (HOSPITAL_COMMUNITY)
Admission: RE | Disposition: A | Discharge status: Home / Self Care | Payer: Self-pay | Source: Home / Self Care | Attending: Urology

## 2018-09-23 ENCOUNTER — Encounter (HOSPITAL_COMMUNITY): Payer: Self-pay | Admitting: *Deleted

## 2018-09-23 ENCOUNTER — Other Ambulatory Visit: Payer: Self-pay

## 2018-09-23 DIAGNOSIS — E1151 Type 2 diabetes mellitus with diabetic peripheral angiopathy without gangrene: Secondary | ICD-10-CM | POA: Diagnosis not present

## 2018-09-23 DIAGNOSIS — I2510 Atherosclerotic heart disease of native coronary artery without angina pectoris: Secondary | ICD-10-CM | POA: Insufficient documentation

## 2018-09-23 DIAGNOSIS — J449 Chronic obstructive pulmonary disease, unspecified: Secondary | ICD-10-CM | POA: Diagnosis not present

## 2018-09-23 DIAGNOSIS — N200 Calculus of kidney: Secondary | ICD-10-CM | POA: Diagnosis not present

## 2018-09-23 DIAGNOSIS — Z85828 Personal history of other malignant neoplasm of skin: Secondary | ICD-10-CM | POA: Diagnosis not present

## 2018-09-23 DIAGNOSIS — G4730 Sleep apnea, unspecified: Secondary | ICD-10-CM | POA: Insufficient documentation

## 2018-09-23 DIAGNOSIS — Z9049 Acquired absence of other specified parts of digestive tract: Secondary | ICD-10-CM | POA: Insufficient documentation

## 2018-09-23 DIAGNOSIS — Z8673 Personal history of transient ischemic attack (TIA), and cerebral infarction without residual deficits: Secondary | ICD-10-CM | POA: Insufficient documentation

## 2018-09-23 DIAGNOSIS — Z794 Long term (current) use of insulin: Secondary | ICD-10-CM | POA: Insufficient documentation

## 2018-09-23 DIAGNOSIS — E785 Hyperlipidemia, unspecified: Secondary | ICD-10-CM | POA: Diagnosis not present

## 2018-09-23 DIAGNOSIS — Z87891 Personal history of nicotine dependence: Secondary | ICD-10-CM | POA: Diagnosis not present

## 2018-09-23 DIAGNOSIS — I10 Essential (primary) hypertension: Secondary | ICD-10-CM | POA: Insufficient documentation

## 2018-09-23 DIAGNOSIS — Z89511 Acquired absence of right leg below knee: Secondary | ICD-10-CM | POA: Insufficient documentation

## 2018-09-23 DIAGNOSIS — Z8614 Personal history of Methicillin resistant Staphylococcus aureus infection: Secondary | ICD-10-CM | POA: Diagnosis not present

## 2018-09-23 DIAGNOSIS — K3184 Gastroparesis: Secondary | ICD-10-CM | POA: Diagnosis not present

## 2018-09-23 DIAGNOSIS — E1143 Type 2 diabetes mellitus with diabetic autonomic (poly)neuropathy: Secondary | ICD-10-CM | POA: Insufficient documentation

## 2018-09-23 DIAGNOSIS — K219 Gastro-esophageal reflux disease without esophagitis: Secondary | ICD-10-CM | POA: Diagnosis not present

## 2018-09-23 HISTORY — PX: HOLMIUM LASER APPLICATION: SHX5852

## 2018-09-23 HISTORY — PX: CYSTOSCOPY WITH RETROGRADE PYELOGRAM, URETEROSCOPY AND STENT PLACEMENT: SHX5789

## 2018-09-23 LAB — GLUCOSE, CAPILLARY
Glucose-Capillary: 336 mg/dL — ABNORMAL HIGH (ref 70–99)
Glucose-Capillary: 259 mg/dL — ABNORMAL HIGH (ref 70–99)

## 2018-09-23 SURGERY — CYSTOURETEROSCOPY, WITH RETROGRADE PYELOGRAM AND STENT INSERTION
Anesthesia: General | Site: Ureter | Laterality: Right

## 2018-09-23 MED ORDER — OXYCODONE HCL 5 MG PO TABS
10.0000 mg | ORAL_TABLET | Freq: Once | ORAL | Status: AC | PRN
  Administered 2018-09-23: 10 mg via ORAL

## 2018-09-23 MED ORDER — IOHEXOL 300 MG/ML  SOLN
INTRAMUSCULAR | Status: DC | PRN
  Administered 2018-09-23: 7 mL via URETHRAL

## 2018-09-23 MED ORDER — PROMETHAZINE HCL 25 MG/ML IJ SOLN: 6.2500 mg | INTRAMUSCULAR | Status: DC | PRN

## 2018-09-23 MED ORDER — FENTANYL CITRATE (PF) 100 MCG/2ML IJ SOLN
25.0000 ug | INTRAMUSCULAR | Status: DC | PRN
  Administered 2018-09-23 (×3): 50 ug via INTRAVENOUS

## 2018-09-23 MED ORDER — ACETAMINOPHEN 10 MG/ML IV SOLN
1000.0000 mg | Freq: Once | INTRAVENOUS | Status: DC | PRN
  Administered 2018-09-23: 1000 mg via INTRAVENOUS

## 2018-09-23 MED ORDER — FENTANYL CITRATE (PF) 100 MCG/2ML IJ SOLN
INTRAMUSCULAR | Status: DC | PRN
  Administered 2018-09-23: 50 ug via INTRAVENOUS
  Administered 2018-09-23 (×2): 25 ug via INTRAVENOUS

## 2018-09-23 MED ORDER — OXYCODONE HCL 30 MG PO TABS: 30.0000 mg | ORAL_TABLET | ORAL | 0 refills | Status: DC | PRN

## 2018-09-23 MED ORDER — SUCCINYLCHOLINE CHLORIDE 200 MG/10ML IV SOSY
PREFILLED_SYRINGE | INTRAVENOUS | Status: DC | PRN
  Administered 2018-09-23: 140 mg via INTRAVENOUS

## 2018-09-23 MED ORDER — LACTATED RINGERS IV SOLN
INTRAVENOUS | Status: DC
  Administered 2018-09-23: 11:00:00 via INTRAVENOUS

## 2018-09-23 MED ORDER — FENTANYL CITRATE (PF) 100 MCG/2ML IJ SOLN
INTRAMUSCULAR | Status: AC
  Filled 2018-09-23: qty 2

## 2018-09-23 MED ORDER — PROPOFOL 10 MG/ML IV BOLUS
INTRAVENOUS | Status: DC | PRN
  Administered 2018-09-23: 200 mg via INTRAVENOUS

## 2018-09-23 MED ORDER — DEXAMETHASONE SODIUM PHOSPHATE 10 MG/ML IJ SOLN
INTRAMUSCULAR | Status: DC | PRN
  Administered 2018-09-23: 8 mg via INTRAVENOUS

## 2018-09-23 MED ORDER — SUCCINYLCHOLINE CHLORIDE 200 MG/10ML IV SOSY
PREFILLED_SYRINGE | INTRAVENOUS | Status: AC
  Filled 2018-09-23: qty 10

## 2018-09-23 MED ORDER — ONDANSETRON HCL 4 MG/2ML IJ SOLN
INTRAMUSCULAR | Status: DC | PRN
  Administered 2018-09-23: 4 mg via INTRAVENOUS

## 2018-09-23 MED ORDER — 0.9 % SODIUM CHLORIDE (POUR BTL) OPTIME
TOPICAL | Status: DC | PRN
  Administered 2018-09-23: 1000 mL

## 2018-09-23 MED ORDER — LIDOCAINE 2% (20 MG/ML) 5 ML SYRINGE
INTRAMUSCULAR | Status: DC | PRN
  Administered 2018-09-23: 100 mg via INTRAVENOUS

## 2018-09-23 MED ORDER — KETOROLAC TROMETHAMINE 30 MG/ML IJ SOLN
INTRAMUSCULAR | Status: AC
  Administered 2018-09-23: 30 mg
  Filled 2018-09-23: qty 1

## 2018-09-23 MED ORDER — PROPOFOL 10 MG/ML IV BOLUS
INTRAVENOUS | Status: AC
  Filled 2018-09-23: qty 20

## 2018-09-23 MED ORDER — OXYCODONE HCL 5 MG PO TABS
10.0000 mg | ORAL_TABLET | Freq: Once | ORAL | Status: AC
  Administered 2018-09-23: 10 mg via ORAL

## 2018-09-23 MED ORDER — LIDOCAINE 2% (20 MG/ML) 5 ML SYRINGE
INTRAMUSCULAR | Status: AC
  Filled 2018-09-23: qty 5

## 2018-09-23 MED ORDER — MIDAZOLAM HCL 5 MG/5ML IJ SOLN
INTRAMUSCULAR | Status: DC | PRN
  Administered 2018-09-23: 2 mg via INTRAVENOUS

## 2018-09-23 MED ORDER — ACETAMINOPHEN 10 MG/ML IV SOLN
INTRAVENOUS | Status: AC
  Filled 2018-09-23: qty 100

## 2018-09-23 MED ORDER — SODIUM CHLORIDE 0.9 % IV SOLN: 2.0000 g | INTRAVENOUS | Status: DC

## 2018-09-23 MED ORDER — OXYCODONE HCL 5 MG PO TABS
ORAL_TABLET | ORAL | Status: AC
  Administered 2018-09-23: 10 mg via ORAL
  Filled 2018-09-23: qty 2

## 2018-09-23 MED ORDER — SODIUM CHLORIDE 0.9 % IV SOLN
1.0000 g | INTRAVENOUS | Status: AC
  Administered 2018-09-23: 1 g via INTRAVENOUS
  Filled 2018-09-23: qty 1

## 2018-09-23 MED ORDER — SODIUM CHLORIDE 0.9 % IR SOLN
Status: DC | PRN
  Administered 2018-09-23: 6000 mL

## 2018-09-23 MED ORDER — OXYCODONE HCL 5 MG/5ML PO SOLN: 10.0000 mg | Freq: Once | ORAL | Status: AC | PRN

## 2018-09-23 MED ORDER — FENTANYL CITRATE (PF) 100 MCG/2ML IJ SOLN
INTRAMUSCULAR | Status: AC
  Filled 2018-09-23: qty 4

## 2018-09-23 MED ORDER — MIDAZOLAM HCL 2 MG/2ML IJ SOLN
INTRAMUSCULAR | Status: AC
  Filled 2018-09-23: qty 2

## 2018-09-23 MED ORDER — OXYCODONE HCL 5 MG PO TABS
ORAL_TABLET | ORAL | Status: AC
  Filled 2018-09-23: qty 2

## 2018-09-23 MED FILL — oxyCODONE HCL 30 MG TABS: 30 | 5 days supply | Qty: 30 | Fill #0

## 2018-09-23 SURGICAL SUPPLY — 18 items
BAG URO CATCHER STRL LF (MISCELLANEOUS) ×3 IMPLANT
CATH INTERMIT  6FR 70CM (CATHETERS) ×3 IMPLANT
CLOTH BEACON ORANGE TIMEOUT ST (SAFETY) ×3 IMPLANT
COVER SURGICAL LIGHT HANDLE (MISCELLANEOUS) ×3 IMPLANT
EXTRACTOR STONE NITINOL NGAGE (UROLOGICAL SUPPLIES) ×2 IMPLANT
FIBER LASER TRAC TIP (UROLOGICAL SUPPLIES) ×2 IMPLANT
GLOVE BIO SURGEON STRL SZ8 (GLOVE) ×3 IMPLANT
GOWN STRL REUS W/TWL 2XL LVL3 (GOWN DISPOSABLE) ×2 IMPLANT
GOWN STRL REUS W/TWL XL LVL3 (GOWN DISPOSABLE) ×3 IMPLANT
GUIDEWIRE ANG ZIPWIRE 038X150 (WIRE) ×3 IMPLANT
GUIDEWIRE STR DUAL SENSOR (WIRE) ×2 IMPLANT
MANIFOLD NEPTUNE II (INSTRUMENTS) ×3 IMPLANT
PACK CYSTO (CUSTOM PROCEDURE TRAY) ×3 IMPLANT
SHEATH URETERAL 12FRX35CM (MISCELLANEOUS) ×2 IMPLANT
STENT URET 6FRX26 CONTOUR (STENTS) ×2 IMPLANT
TUBE FEEDING 8FR 16IN STR KANG (MISCELLANEOUS) IMPLANT
TUBING CONNECTING 10 (TUBING) ×2 IMPLANT
TUBING CONNECTING 10' (TUBING) ×1

## 2018-09-23 NOTE — Brief Op Note (Signed)
09/23/2018  2:18 PM  PATIENT:  [name]  68 y.o. male  PRE-OPERATIVE DIAGNOSIS:  RIGHT RENAL CALCULUS  POST-OPERATIVE DIAGNOSIS:  RIGHT RENAL CALCULUS  PROCEDURE:  Procedure(s): CYSTOSCOPY WITH RETROGRADE PYELOGRAM, URETEROSCOPY AND STENT PLACEMENT (Right) HOLMIUM LASER APPLICATION (Right)  SURGEON:  Surgeon(s) and Role:    * Tyresa Prindiville, Candee Furbish, MD - Primary  PHYSICIAN ASSISTANT:   ASSISTANTS: none   ANESTHESIA:   general  EBL:  minimal   BLOOD ADMINISTERED:none  DRAINS: right 6x26 JJureteral stent   LOCAL MEDICATIONS USED:  NONE  SPECIMEN:  Source of Specimen:  right renal calculi  DISPOSITION OF SPECIMEN:  PATHOLOGY  COUNTS:  YES  TOURNIQUET:  * No tourniquets in log *  DICTATION: .Note written in EPIC  PLAN OF CARE: Discharge to home after PACU  PATIENT DISPOSITION:  PACU - hemodynamically stable.   Delay start of Pharmacological VTE agent (>24hrs) due to surgical blood loss or risk of bleeding: not applicable

## 2018-09-23 NOTE — Anesthesia Procedure Notes (Signed)
Procedure Name: Intubation Date/Time: 09/23/2018 1:15 PM Performed by: Governor Matos, Clinical cytogeneticist D, CRNA Pre-anesthesia Checklist: Patient identified, Emergency Drugs available, Suction available and Patient being monitored Patient Re-evaluated:Patient Re-evaluated prior to induction Oxygen Delivery Method: Circle system utilized Preoxygenation: Pre-oxygenation with 100% oxygen Induction Type: IV induction and Rapid sequence Laryngoscope Size: Glidescope and 4 Grade View: Grade I Tube type: Oral Tube size: 7.5 mm Number of attempts: 1 Airway Equipment and Method: Stylet and Oral airway Placement Confirmation: ETT inserted through vocal cords under direct vision,  positive ETCO2 and breath sounds checked- equal and bilateral Secured at: 23 cm Tube secured with: Tape Dental Injury: Teeth and Oropharynx as per pre-operative assessment

## 2018-09-23 NOTE — Transfer of Care (Signed)
Immediate Anesthesia Transfer of Care Note  Patient: [name]  Procedure(s) Performed: CYSTOSCOPY WITH RETROGRADE PYELOGRAM, URETEROSCOPY AND STENT PLACEMENT (Right Ureter) HOLMIUM LASER APPLICATION (Right Ureter)  Patient Location: PACU  Anesthesia Type:General  Level of Consciousness: awake, alert  and oriented  Airway & Oxygen Therapy: Patient Spontanous Breathing and Patient connected to face mask oxygen  Post-op Assessment: Report given to RN and Post -op Vital signs reviewed and stable  Post vital signs: Reviewed and stable  Last Vitals:  Vitals Value Taken Time  BP    Temp    Pulse 76 09/23/2018  2:30 PM  Resp 14 09/23/2018  2:30 PM  SpO2 100 % 09/23/2018  2:30 PM  Vitals shown include unvalidated device data.  Last Pain:  Vitals:   09/23/18 1047  TempSrc:   PainSc: 8       Patients Stated Pain Goal: 5 (37/16/96 7893)  Complications: No apparent anesthesia complications

## 2018-09-23 NOTE — Discharge Instructions (Signed)
Ureteral Stent Implantation, Care After °Refer to this sheet in the next few weeks. These instructions provide you with information about caring for yourself after your procedure. Your health care provider may also give you more specific instructions. Your treatment has been planned according to current medical practices, but problems sometimes occur. Call your health care provider if you have any problems or questions after your procedure. °What can I expect after the procedure? °After the procedure, it is common to have: °· Nausea. °· Mild pain when you urinate. You may feel this pain in your lower back or lower abdomen. Pain should stop within a few minutes after you urinate. This may last for up to 1 week. °· A small amount of blood in your urine for several days. °Follow these instructions at home: ° °Medicines °· Take over-the-counter and prescription medicines only as told by your health care provider. °· If you were prescribed an antibiotic medicine, take it as told by your health care provider. Do not stop taking the antibiotic even if you start to feel better. °· Do not drive for 24 hours if you received a sedative. °· Do not drive or operate heavy machinery while taking prescription pain medicines. °Activity °· Return to your normal activities as told by your health care provider. Ask your health care provider what activities are safe for you. °· Do not lift anything that is heavier than 10 lb (4.5 kg). Follow this limit for 1 week after your procedure, or for as long as told by your health care provider. °General instructions °· Watch for any blood in your urine. Call your health care provider if the amount of blood in your urine increases. °· If you have a catheter: °? Follow instructions from your health care provider about taking care of your catheter and collection bag. °? Do not take baths, swim, or use a hot tub until your health care provider approves. °· Drink enough fluid to keep your urine  clear or pale yellow. °· Keep all follow-up visits as told by your health care provider. This is important. °Contact a health care provider if: °· You have pain that gets worse or does not get better with medicine, especially pain when you urinate. °· You have difficulty urinating. °· You feel nauseous or you vomit repeatedly during a period of more than 2 days after the procedure. °Get help right away if: °· Your urine is dark red or has blood clots in it. °· You are leaking urine (have incontinence). °· The end of the stent comes out of your urethra. °· You cannot urinate. °· You have sudden, sharp, or severe pain in your abdomen or lower back. °· You have a fever. °This information is not intended to replace advice given to you by your health care provider. Make sure you discuss any questions you have with your health care provider. °Document Released: 05/04/2013 Document Revised: 02/07/2016 Document Reviewed: 03/16/2015 °Elsevier Interactive Patient Education © 2019 [employer] Inc[employer] ° °
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
## 2018-09-24 ENCOUNTER — Encounter (HOSPITAL_COMMUNITY): Payer: Self-pay | Admitting: Urology

## 2018-09-27 ENCOUNTER — Telehealth: Payer: Self-pay | Admitting: Anesthesiology

## 2018-09-27 NOTE — Telephone Encounter (Signed)
pts wife called to let us know that pt had a procedure done at [facility] and was prescribed 30mg  oxycodone. Also pharmacy called and asked for permission to fill this rx since the pt has a pain contract with [place]. Please call back and give ok to fill this rx [phone]. Thanks!

## 2018-09-27 NOTE — Op Note (Signed)
.  Preoperative diagnosis: right renal calculi  Postoperative diagnosis: Same  Procedure: 1 cystoscopy 2.  right retrograde pyelography 3.  Intraoperative fluoroscopy, under one hour, with interpretation 4.  right ureteroscopic stone manipulation with laser lithotripsy 5.  right 6 x 26 JJ stent placement 6.  Left ureteral stent removal  Attending: Rosie Fate  Anesthesia: General  Estimated blood loss: None  Drains: right 6 x 26 JJ ureteral stent without tether  Specimens: stone for analysis  Antibiotics: ancef  Findings: right lower and mid pole stones. No hydronephrosis. No masses/lesions in the bladder. Ureteral orifices in normal anatomic location.  Indications: Patient is a 68 year old male with a history of right renal stone and who has persistent right flank pain.  After discussing treatment options, he decided proceed with right ureteroscopic stone manipulation.  Procedure her in detail: The patient was brought to the operating room and a brief timeout was done to ensure correct patient, correct procedure, correct site.  General anesthesia was administered patient was placed in dorsal lithotomy position.  His genitalia was then prepped and draped in usual sterile fashion.  A rigid 50 French cystoscope was passed in the urethra and the bladder.  Bladder was inspected free masses or lesions.  the ureteral orifices were in the normal orthotopic locations. Using a grasper the left ureteral stent was removed. a 6 french ureteral catheter was then instilled into the right ureteral orifice.  a gentle retrograde was obtained and findings noted above.  we then placed a zip wire through the ureteral catheter and advanced up to the renal pelvis.  we then removed the cystoscope and cannulated the right ureteral orifice with a semirigid ureteroscope.  No stone was found in the ureter. Once we reached the UPJ a sensor wire was advanced in to the renal pelvis. We then removed the ureteroscope  and advanced am 12/14 x 38cm access sheath up to the renal pelvis. We then used the flexible ureteroscope to perform nephroscopy. We encountered the stones in the lower and mid poles.  Using a 200nm laser fiber the stones were fragmented.  the pieces were then removed with a Ngage basket.    once all stone fragments were removed we then removed the access sheath under direct vision and noted no injury to the ureter. We then placed a 6 x 26 double-j ureteral stent over the original zip wire.  We then removed the wire and good coil was noted in the the renal pelvis under fluoroscopy and the bladder under direct vision. the bladder was then drained and this concluded the procedure which was well tolerated by patient.  Complications: None  Condition: Stable, extubated, transferred to PACU  Plan: Patient is to be discharged home as to follow-up in one week for stent removal.

## 2018-09-27 NOTE — Telephone Encounter (Signed)
Spoke with patient's wife and patient did have a procedure done and a stent placed in ureters.  This was done on Thursday and pharmacy would not fill his post-operative pain medication d/t the pain contract.  I did tell [name] that in the future that if she knows that [name] is having a procedure or surgery to let us know and we can give them a surgeons letter indicating that it is okay to prescribe post-operative medication.  Called to [name] at [facility] to let them know that it is okay to fill this post-operative pain medication.

## 2018-09-30 DIAGNOSIS — N200 Calculus of kidney: Secondary | ICD-10-CM | POA: Diagnosis not present

## 2018-10-04 NOTE — Anesthesia Postprocedure Evaluation (Signed)
Anesthesia Post Note  Patient: [name]  Procedure(s) Performed: CYSTOSCOPY WITH RETROGRADE PYELOGRAM, URETEROSCOPY AND STENT PLACEMENT (Right Ureter) HOLMIUM LASER APPLICATION (Right Ureter)     Patient location during evaluation: PACU Anesthesia Type: General Level of consciousness: awake and alert Pain management: pain level controlled Vital Signs Assessment: post-procedure vital signs reviewed and stable Respiratory status: spontaneous breathing, nonlabored ventilation, respiratory function stable and patient connected to nasal cannula oxygen Cardiovascular status: blood pressure returned to baseline and stable Postop Assessment: no apparent nausea or vomiting Anesthetic complications: no    Last Vitals:  Vitals:   09/23/18 1531 09/23/18 1610  BP: (!) 152/69 138/68  Pulse:  65  Resp: 18 20  Temp: 36.5 C 36.4 C  SpO2: 96% 98%    Last Pain:  Vitals:   09/23/18 1540  TempSrc:   PainSc: 4                  Manny Vitolo S

## 2018-10-07 ENCOUNTER — Telehealth: Payer: Self-pay | Admitting: *Deleted

## 2018-10-07 ENCOUNTER — Other Ambulatory Visit: Payer: Self-pay

## 2018-10-07 ENCOUNTER — Encounter: Payer: Self-pay | Admitting: Anesthesiology

## 2018-10-07 ENCOUNTER — Other Ambulatory Visit: Payer: Self-pay | Admitting: Anesthesiology

## 2018-10-07 ENCOUNTER — Ambulatory Visit: Payer: Medicare Other | Attending: Anesthesiology | Admitting: Anesthesiology

## 2018-10-07 VITALS — BP 139/51 | HR 78 | Temp 98.4°F | Resp 16 | Ht 73.0 in | Wt 272.0 lb

## 2018-10-07 DIAGNOSIS — M25511 Pain in right shoulder: Secondary | ICD-10-CM | POA: Insufficient documentation

## 2018-10-07 DIAGNOSIS — M5134 Other intervertebral disc degeneration, thoracic region: Secondary | ICD-10-CM | POA: Diagnosis not present

## 2018-10-07 DIAGNOSIS — G894 Chronic pain syndrome: Secondary | ICD-10-CM | POA: Diagnosis not present

## 2018-10-07 DIAGNOSIS — R531 Weakness: Secondary | ICD-10-CM | POA: Diagnosis not present

## 2018-10-07 DIAGNOSIS — F1190 Opioid use, unspecified, uncomplicated: Secondary | ICD-10-CM | POA: Diagnosis present

## 2018-10-07 DIAGNOSIS — M542 Cervicalgia: Secondary | ICD-10-CM | POA: Diagnosis not present

## 2018-10-07 DIAGNOSIS — M961 Postlaminectomy syndrome, not elsewhere classified: Secondary | ICD-10-CM

## 2018-10-07 DIAGNOSIS — M25512 Pain in left shoulder: Secondary | ICD-10-CM | POA: Diagnosis not present

## 2018-10-07 MED ORDER — OXYCODONE HCL 10 MG PO TABS: 10.0000 mg | ORAL_TABLET | Freq: Four times a day (QID) | ORAL | 0 refills | Status: DC

## 2018-10-07 MED ORDER — PREGABALIN 100 MG PO CAPS: 100.0000 mg | ORAL_CAPSULE | Freq: Three times a day (TID) | ORAL | 0 refills | Status: DC

## 2018-10-07 MED ORDER — NALOXONE HCL 4 MG/0.1ML NA LIQD: NASAL | 1 refills | Status: AC

## 2018-10-07 MED ORDER — OXYCODONE HCL 10 MG PO TABS: 10.0000 mg | ORAL_TABLET | Freq: Four times a day (QID) | ORAL | 0 refills | Status: AC

## 2018-10-07 MED FILL — traZODone HCL 100 MG TABS: 100 | 90 days supply | Qty: 90 | Fill #1

## 2018-10-07 MED FILL — PREGABALIN 100 MG CAPS: 100 | 30 days supply | Qty: 90 | Fill #0

## 2018-10-07 NOTE — Telephone Encounter (Signed)
[name] from [facility] called with concerns regarding the amount of medication given. She states a prescription was escribed for fill today, 10-15-2018 and 11-14-2018. Questioning exact date for it to be filled. Discussed with Dr. Andree Elk. There should be no prescription to be filled today. There should be a prescription for fill date 10-15-2018 and 11-14-2018. [name] notified to cancel the prescription that had fill date today 10-07-2018.

## 2018-10-07 NOTE — Patient Instructions (Signed)
____________________________________________________________________________________________  Medication Rules  Applies to: All patients receiving prescriptions (written or electronic).  Pharmacy of record: Pharmacy where electronic prescriptions will be sent. If written prescriptions are taken to a different pharmacy, please inform the nursing staff. The pharmacy listed in the electronic medical record should be the one where you would like electronic prescriptions to be sent.  Prescription refills: Only during scheduled appointments. Applies to both, written and electronic prescriptions.  NOTE: The following applies primarily to controlled substances (Opioid* Pain Medications).   Patient's responsibilities: 1. Pain Pills: Bring all pain pills to every appointment (except for procedure appointments). 2. Pill Bottles: Bring pills in original pharmacy bottle. Always bring newest bottle. Bring bottle, even if empty. 3. Medication refills: You are responsible for knowing and keeping track of what medications you need refilled. The day before your appointment, write a list of all prescriptions that need to be refilled. Bring that list to your appointment and give it to the admitting nurse. Prescriptions will be written only during appointments. If you forget a medication, it will not be "Called in", "Faxed", or "electronically sent". You will need to get another appointment to get these prescribed. 4. Prescription Accuracy: You are responsible for carefully inspecting your prescriptions before leaving our office. Have the discharge nurse carefully go over each prescription with you, before taking them home. Make sure that your name is accurately spelled, that your address is correct. Check the name and dose of your medication to make sure it is accurate. Check the number of pills, and the written instructions to make sure they are clear and accurate. Make sure that you are given enough medication to last  until your next medication refill appointment. 5. Taking Medication: Take medication as prescribed. Never take more pills than instructed. Never take medication more frequently than prescribed. Taking less pills or less frequently is permitted and encouraged, when it comes to controlled substances (written prescriptions).  6. Inform other Doctors: Always inform, all of your healthcare providers, of all the medications you take. 7. Pain Medication from other Providers: You are not allowed to accept any additional pain medication from any other Doctor or Healthcare provider. There are two exceptions to this rule. (see below) In the event that you require additional pain medication, you are responsible for notifying us, as stated below. 8. Medication Agreement: You are responsible for carefully reading and following our Medication Agreement. This must be signed before receiving any prescriptions from our practice. Safely store a copy of your signed Agreement. Violations to the Agreement will result in no further prescriptions. (Additional copies of our Medication Agreement are available upon request.) 9. Laws, Rules, & Regulations: All patients are expected to follow all Federal and State Laws, Statutes, Rules, & Regulations. Ignorance of the Laws does not constitute a valid excuse. The use of any illegal substances is prohibited. 10. Adopted CDC guidelines & recommendations: Target dosing levels will be at or below 60 MME/day. Use of benzodiazepines** is not recommended.  Exceptions: There are only two exceptions to the rule of not receiving pain medications from other Healthcare Providers. 1. Exception #1 (Emergencies): In the event of an emergency (i.e.: accident requiring emergency care), you are allowed to receive additional pain medication. However, you are responsible for: As soon as you are able, call our office [phone], at any time of the day or night, and leave a message stating your name, the  date and nature of the emergency, and the name and dose of the medication   prescribed. In the event that your call is answered by a member of our staff, make sure to document and save the date, time, and the name of the person that took your information.  2. Exception #2 (Planned Surgery): In the event that you are scheduled by another doctor or dentist to have any type of surgery or procedure, you are allowed (for a period no longer than 30 days), to receive additional pain medication, for the acute post-op pain. However, in this case, you are responsible for picking up a copy of our "Post-op Pain Management for Surgeons" handout, and giving it to your surgeon or dentist. This document is available at our office, and does not require an appointment to obtain it. Simply go to our office during business hours (Monday-Thursday from 8:00 AM to 4:00 PM) (Friday 8:00 AM to 12:00 Noon) or if you have a scheduled appointment with us, prior to your surgery, and ask for it by name. In addition, you will need to provide us with your name, name of your surgeon, type of surgery, and date of procedure or surgery.  *Opioid medications include: morphine, codeine, oxycodone, oxymorphone, hydrocodone, hydromorphone, meperidine, tramadol, tapentadol, buprenorphine, fentanyl, methadone. **Benzodiazepine medications include: diazepam (Valium), alprazolam (Xanax), clonazepam (Klonopine), lorazepam (Ativan), clorazepate (Tranxene), chlordiazepoxide (Librium), estazolam (Prosom), oxazepam (Serax), temazepam (Restoril), triazolam (Halcion) (Last updated: 11/12/2017) ____________________________________________________________________________________________    

## 2018-10-07 NOTE — Progress Notes (Signed)
Subjective:  Patient ID: [name], male    DOB: 1951/04/24  Age: 68 y.o. MRN: [id number]  CC: Back Pain (mid) and Abdominal Pain   Procedure: None  HPI [name] presents for reevaluation.  He was last seen 2 months ago and is been doing well on his oxycodone 10 mg tablets taking these 4 times a day.  The quality characteristic and distribution of his diffuse body pain low back pain leg pain hip pain have remained stable in nature.  He did pass a recent kidney stone and admits that he was in the emergency room and did receive a short acting supply of opioid.  I have gone over his [place] information and this supports the story.  Based on his narcotic assessment sheet he is getting good relief with his medications and no untoward side effects are noted.  The style of his pain remained stable in nature.  Outpatient Medications Prior to Visit  Medication Sig Dispense Refill  . acetaminophen (TYLENOL) 500 MG tablet Take 500-1,000 mg by mouth every 6 (six) hours as needed for headache.     . albuterol (PROVENTIL HFA;VENTOLIN HFA) 108 (90 Base) MCG/ACT inhaler Inhale 1-2 puffs into the lungs every 6 (six) hours as needed for wheezing or shortness of breath.    Marland Kitchen atorvastatin (LIPITOR) 10 MG tablet Take 1 tablet (10 mg total) by mouth daily at 6 PM. 90 tablet 3  . Continuous Blood Gluc Sensor (FREESTYLE LIBRE 14 DAY SENSOR) MISC U Q 14 DAYS UTD  5  . diphenhydrAMINE (BENADRYL) 25 mg capsule Take 25 mg by mouth 3 (three) times daily as needed for itching.     . furosemide (LASIX) 40 MG tablet Take 1 tablet (40 mg total) by mouth daily. 90 tablet 3  . glucose blood (FREESTYLE LITE) test strip Use as instructed 400 each 3  . glucose blood test strip Check qid Use as instructed 400 each 12  . HUMULIN R U-500 KWIKPEN 500 UNIT/ML kwikpen Inject 0-125 Units into the skin 4 (four) times daily -  with meals and at bedtime. Sliding scale: (u500  insulin) Breakfast: 70-90: 51u, 91-130: 85u, 131-150: 90u, 151-200: 95u, 201-250: 100u, 251-300: 105u, 301-350: 110u, 351-400: 115u, 401-450: 120u, >450: 125u Lunch:  70-90: 45u, 91-130: 75u, 131-150: 80u, 151-200: 85u, 201-250: 90u, 251-300: 95u, 301-350: 100u, 351-400: 105u, 401-450: 110u, >450: 115u Supper: 70-90: 42u, 91-130: 70u, 131-150: 75u, 151-200: 80u, 201-250: 85u, 251-300: 90u, 301-350: 95u, 351-400: 100u, 401-450: 105u, >450: 110u Nighttime:  70-90: 0u, 91-130: 0u, 131-150: 0u, 151-200: 0u, 201-250: 0u, 251-300: 1u, 301-350: 2u, 351-400: 3u, 401-450: 4u, >450: 5u  11  . Lancets (FREESTYLE) lancets Qid check glucoseUse as instructed 400 each 3  . levocetirizine (XYZAL) 5 MG tablet Take 5 mg by mouth daily as needed for allergies.     Marland Kitchen linaclotide (LINZESS) 72 MCG capsule Take 1 capsule (72 mcg total) by mouth daily before breakfast. (Patient taking differently: Take 72 mcg by mouth daily as needed (constipation.). ) 90 capsule 3  . LORazepam (ATIVAN) 0.5 MG tablet Take 0.5 mg by mouth 3 (three) times daily as needed for anxiety.    Marland Kitchen losartan (COZAAR) 25 MG tablet Take 0.5 tablets (12.5 mg total) by mouth daily. 45 tablet 2  . mupirocin ointment (BACTROBAN) 2 % Apply 1 application topically 2 (two) times daily. (Patient taking differently: Apply 1 application topically 2 (two) times daily as needed (sores). ) 30 g 0  .  nystatin (MYCOSTATIN) 100000 UNIT/ML suspension Take 5 mLs by mouth 4 (four) times daily as needed. For mouth sores.    . nystatin (MYCOSTATIN/NYSTOP) powder Apply 1 application topically 2 (two) times daily as needed. For yeast around skin folds    . ondansetron (ZOFRAN-ODT) 4 MG disintegrating tablet Take 1 tablet (4 mg total) by mouth 2 (two) times daily as needed for nausea or vomiting. 60 tablet 1  . OVER THE COUNTER MEDICATION Take 1 packet by mouth daily. Crystal Light Lemonade Daily    . pantoprazole (PROTONIX) 40 MG tablet Take 40 mg by mouth 2 (two) times daily.    1  . pregabalin (LYRICA) 100 MG capsule Take 1 capsule (100 mg total) by mouth 3 (three) times daily. 90 capsule 2  . promethazine (PHENERGAN) 25 MG tablet Take 1 tablet (25 mg total) by mouth every 8 (eight) hours as needed for nausea or vomiting. 90 tablet 1  . tamsulosin (FLOMAX) 0.4 MG CAPS capsule Take 1 capsule (0.4 mg total) by mouth at bedtime. 30 capsule 0  . traZODone (DESYREL) 100 MG tablet Take 1 tablet (100 mg total) by mouth at bedtime. 90 tablet 3  . triamcinolone cream (KENALOG) 0.1 % Apply 1 application topically 2 (two) times daily as needed (rash/itching).    Marland Kitchen oxycodone (ROXICODONE) 30 MG immediate release tablet Take 1 tablet (30 mg total) by mouth every 4 (four) hours as needed for pain. 30 tablet 0  . Oxycodone HCl 10 MG TABS Take 2 tablets (20 mg total) by mouth every 4 (four) hours as needed for up to 30 doses (pain). 30 tablet 0  . docusate calcium (V-R DOCUSATE CALCIUM) 240 MG capsule Take 240 mg by mouth 2 (two) times daily as needed for mild constipation.     No facility-administered medications prior to visit.     Review of Systems CNS: No confusion or sedation Cardiac: No angina or palpitations GI: No abdominal pain or constipation Constitutional: No nausea vomiting fevers or chills  Objective:  BP (!) 139/51 (BP Location: Right Arm, Patient Position: Sitting, Cuff Size: Normal)   Pulse 78   Temp 98.4 F (36.9 C) (Oral)   Resp 16   Ht '6\' 1"'  (1.854 m)   Wt 272 lb (123.4 kg)   SpO2 99%   BMI 35.89 kg/m    BP Readings from Last 3 Encounters:  10/07/18 (!) 139/51  09/23/18 138/68  09/22/18 112/60     Wt Readings from Last 3 Encounters:  10/07/18 272 lb (123.4 kg)  09/23/18 272 lb (123.4 kg)  09/22/18 272 lb (123.4 kg)     Physical Exam Pt is alert and oriented PERRL EOMI HEART IS RRR no murmur or rub LCTA no wheezing or rales MUSCULOSKELETAL reveals some paraspinous muscle tenderness but no overt trigger points.  He also has some  tenderness in the bilateral cervical and trapezius region.  Labs  Lab Results  Component Value Date   HGBA1C 6.7 (H) 08/25/2018   HGBA1C 7.8 (H) 04/02/2018   HGBA1C 7.3 (H) 12/06/2015   Lab Results  Component Value Date   MICROALBUR 8.1 07/16/2015   LDLCALC 50 12/06/2015   CREATININE 0.94 09/21/2018    -------------------------------------------------------------------------------------------------------------------- Lab Results  Component Value Date   WBC 4.6 09/21/2018   HGB 12.3 (L) 09/21/2018   HCT 37.3 (L) 09/21/2018   PLT 253 09/21/2018   GLUCOSE 258 (H) 09/21/2018   CHOL 114 04/02/2018   TRIG 223.0 (H) 04/02/2018   HDL 21.30 (L) 04/02/2018  LDLDIRECT 76.0 04/02/2018   LDLCALC 50 12/06/2015   ALT 16 04/02/2018   AST 15 04/02/2018   NA 140 09/21/2018   K 3.9 09/21/2018   CL 106 09/21/2018   CREATININE 0.94 09/21/2018   BUN 6 (L) 09/21/2018   CO2 28 09/21/2018   TSH 1.22 03/25/2012   PSA 0.40 03/25/2012   INR 1.01 01/28/2016   HGBA1C 6.7 (H) 08/25/2018   MICROALBUR 8.1 07/16/2015    --------------------------------------------------------------------------------------------------------------------- No results found.   Assessment & Plan:   Jaceyon was seen today for back pain and abdominal pain.  Diagnoses and all orders for this visit:  DDD (degenerative disc disease), thoracic  Right sided weakness  Failed back surgical syndrome  Chronic pain syndrome  Chronic, continuous use of opioids  Neck pain  Pain of both shoulder joints  Other orders -     Oxycodone HCl 10 MG TABS; Take 1 tablet (10 mg total) by mouth 4 (four) times daily for 30 days. -     Oxycodone HCl 10 MG TABS; Take 1 tablet (10 mg total) by mouth 4 (four) times daily for 30 days. -     naloxone (NARCAN) nasal spray 4 mg/0.1 mL; As directed for opioid induced respiratory  depression        ----------------------------------------------------------------------------------------------------------------------  Problem List Items Addressed This Visit      Unprioritized   Chronic pain syndrome   DDD (degenerative disc disease), thoracic - Primary   Relevant Medications   Oxycodone HCl 10 MG TABS (Start on 10/15/2018)   Oxycodone HCl 10 MG TABS   Right sided weakness   SHOULDER PAIN, BILATERAL    Other Visit Diagnoses    Failed back surgical syndrome       Relevant Medications   Oxycodone HCl 10 MG TABS (Start on 10/15/2018)   Oxycodone HCl 10 MG TABS   Chronic, continuous use of opioids       Neck pain            ----------------------------------------------------------------------------------------------------------------------  1. DDD (degenerative disc disease), thoracic Continue with core stretching strengthening as tolerated.  Obviously this is difficult for him but we reviewed this.  2. Right sided weakness Continue follow-up with his primary care physicians.  3. Failed back surgical syndrome Keep him on his current regimen.  Refills will be given for January 31 and March 1.  We have reviewed the [facility] practitioner database information and it is appropriate.  4. Chronic pain syndrome I have talked to him about receiving other opioids from other physicians.  He mentions that he did try and call to notify us.  I have cautioned him regarding taking this baseline oxycodone with additional opioids and the risk of respiratory depression.  We have given him a prescription for Narcan today and I have instructed him how to use it.  5. Chronic, continuous use of opioids Return to clinic in 2 months.  6. Neck pain   7. Pain of both shoulder joints     ----------------------------------------------------------------------------------------------------------------------  I have discontinued Warren Lacy L. Petrelli Pena's oxycodone. I have  also changed his Oxycodone HCl. Additionally, I am having him start on Oxycodone HCl and naloxone. Lastly, I am having him maintain his HUMULIN R U-500 KWIKPEN, diphenhydrAMINE, LORazepam, acetaminophen, nystatin, nystatin, linaclotide, mupirocin ointment, glucose blood, glucose blood, freestyle, levocetirizine, furosemide, ondansetron, traZODone, atorvastatin, promethazine, pregabalin, pantoprazole, FREESTYLE LIBRE 14 DAY SENSOR, docusate calcium, albuterol, triamcinolone cream, OVER THE COUNTER MEDICATION, tamsulosin, and losartan.   Meds ordered this encounter  Medications  . Oxycodone HCl 10 MG TABS    Sig: Take 1 tablet (10 mg total) by mouth 4 (four) times daily for 30 days.    Dispense:  30 tablet    Refill:  0    This is a 30 day supply. Do not refill early  . Oxycodone HCl 10 MG TABS    Sig: Take 1 tablet (10 mg total) by mouth 4 (four) times daily for 30 days.    Dispense:  120 tablet    Refill:  0  . naloxone (NARCAN) nasal spray 4 mg/0.1 mL    Sig: As directed for opioid induced respiratory depression    Dispense:  1 kit    Refill:  1   Patient's Medications  New Prescriptions   NALOXONE (NARCAN) NASAL SPRAY 4 MG/0.1 ML    As directed for opioid induced respiratory depression   OXYCODONE HCL 10 MG TABS    Take 1 tablet (10 mg total) by mouth 4 (four) times daily for 30 days.  Previous Medications   ACETAMINOPHEN (TYLENOL) 500 MG TABLET    Take 500-1,000 mg by mouth every 6 (six) hours as needed for headache.    ALBUTEROL (PROVENTIL HFA;VENTOLIN HFA) 108 (90 BASE) MCG/ACT INHALER    Inhale 1-2 puffs into the lungs every 6 (six) hours as needed for wheezing or shortness of breath.   ATORVASTATIN (LIPITOR) 10 MG TABLET    Take 1 tablet (10 mg total) by mouth daily at 6 PM.   CONTINUOUS BLOOD GLUC SENSOR (FREESTYLE LIBRE 14 DAY SENSOR) MISC    U Q 14 DAYS UTD   DIPHENHYDRAMINE (BENADRYL) 25 MG CAPSULE    Take 25 mg by mouth 3 (three) times daily as needed for itching.    DOCUSATE  CALCIUM (V-R DOCUSATE CALCIUM) 240 MG CAPSULE    Take 240 mg by mouth 2 (two) times daily as needed for mild constipation.   FUROSEMIDE (LASIX) 40 MG TABLET    Take 1 tablet (40 mg total) by mouth daily.   GLUCOSE BLOOD (FREESTYLE LITE) TEST STRIP    Use as instructed   GLUCOSE BLOOD TEST STRIP    Check qid Use as instructed   HUMULIN R U-500 KWIKPEN 500 UNIT/ML KWIKPEN    Inject 0-125 Units into the skin 4 (four) times daily -  with meals and at bedtime. Sliding scale: (u500 insulin) Breakfast: 70-90: 51u, 91-130: 85u, 131-150: 90u, 151-200: 95u, 201-250: 100u, 251-300: 105u, 301-350: 110u, 351-400: 115u, 401-450: 120u, >450: 125u Lunch:  70-90: 45u, 91-130: 75u, 131-150: 80u, 151-200: 85u, 201-250: 90u, 251-300: 95u, 301-350: 100u, 351-400: 105u, 401-450: 110u, >450: 115u Supper: 70-90: 42u, 91-130: 70u, 131-150: 75u, 151-200: 80u, 201-250: 85u, 251-300: 90u, 301-350: 95u, 351-400: 100u, 401-450: 105u, >450: 110u Nighttime:  70-90: 0u, 91-130: 0u, 131-150: 0u, 151-200: 0u, 201-250: 0u, 251-300: 1u, 301-350: 2u, 351-400: 3u, 401-450: 4u, >450: 5u   LANCETS (FREESTYLE) LANCETS    Qid check glucoseUse as instructed   LEVOCETIRIZINE (XYZAL) 5 MG TABLET    Take 5 mg by mouth daily as needed for allergies.    LINACLOTIDE (LINZESS) 72 MCG CAPSULE    Take 1 capsule (72 mcg total) by mouth daily before breakfast.   LORAZEPAM (ATIVAN) 0.5 MG TABLET    Take 0.5 mg by mouth 3 (three) times daily as needed for anxiety.   LOSARTAN (COZAAR) 25 MG TABLET    Take 0.5 tablets (12.5 mg total) by mouth daily.   MUPIROCIN OINTMENT (BACTROBAN) 2 %  Apply 1 application topically 2 (two) times daily.   NYSTATIN (MYCOSTATIN) 100000 UNIT/ML SUSPENSION    Take 5 mLs by mouth 4 (four) times daily as needed. For mouth sores.   NYSTATIN (MYCOSTATIN/NYSTOP) POWDER    Apply 1 application topically 2 (two) times daily as needed. For yeast around skin folds   ONDANSETRON (ZOFRAN-ODT) 4 MG DISINTEGRATING TABLET    Take 1 tablet  (4 mg total) by mouth 2 (two) times daily as needed for nausea or vomiting.   OVER THE COUNTER MEDICATION    Take 1 packet by mouth daily. Crystal Light Lemonade Daily   PANTOPRAZOLE (PROTONIX) 40 MG TABLET    Take 40 mg by mouth 2 (two) times daily.    PREGABALIN (LYRICA) 100 MG CAPSULE    Take 1 capsule (100 mg total) by mouth 3 (three) times daily.   PROMETHAZINE (PHENERGAN) 25 MG TABLET    Take 1 tablet (25 mg total) by mouth every 8 (eight) hours as needed for nausea or vomiting.   TAMSULOSIN (FLOMAX) 0.4 MG CAPS CAPSULE    Take 1 capsule (0.4 mg total) by mouth at bedtime.   TRAZODONE (DESYREL) 100 MG TABLET    Take 1 tablet (100 mg total) by mouth at bedtime.   TRIAMCINOLONE CREAM (KENALOG) 0.1 %    Apply 1 application topically 2 (two) times daily as needed (rash/itching).  Modified Medications   Modified Medication Previous Medication   OXYCODONE HCL 10 MG TABS Oxycodone HCl 10 MG TABS      Take 1 tablet (10 mg total) by mouth 4 (four) times daily for 30 days.    Take 2 tablets (20 mg total) by mouth every 4 (four) hours as needed for up to 30 doses (pain).  Discontinued Medications   OXYCODONE (ROXICODONE) 30 MG IMMEDIATE RELEASE TABLET    Take 1 tablet (30 mg total) by mouth every 4 (four) hours as needed for pain.   ----------------------------------------------------------------------------------------------------------------------  Follow-up: Return in about 2 months (around 12/06/2018) for evaluation, med refill.    Molli Barrows, MD

## 2018-10-07 NOTE — Progress Notes (Signed)
Nursing Pain Medication Assessment:  Safety precautions to be maintained throughout the outpatient stay will include: orient to surroundings, keep bed in low position, maintain call bell within reach at all times, provide assistance with transfer out of bed and ambulation.  Medication Inspection Compliance: Pill count conducted under aseptic conditions, in front of the patient. Neither the pills nor the bottle was removed from the patient's sight at any time. Once count was completed pills were immediately returned to the patient in their original bottle.  Medication: Oxycodone IR Pill/Patch Count: 19 of 120 pills remain Pill/Patch Appearance: Markings consistent with prescribed medication Bottle Appearance: Standard pharmacy container. Clearly labeled. Filled Date: 65 / 31 / 2019 Last Medication intake:  Today

## 2018-10-11 MED FILL — TAMSULOSIN HCL 0.4 MG CAP: 0.4 | 30 days supply | Qty: 30 | Fill #0

## 2018-10-18 ENCOUNTER — Telehealth: Payer: Self-pay | Admitting: Anesthesiology

## 2018-10-18 ENCOUNTER — Telehealth: Payer: Self-pay

## 2018-10-18 MED FILL — oxyCODONE HCL 10 MG TABS: 10 | 30 days supply | Qty: 120 | Fill #0

## 2018-10-18 NOTE — Telephone Encounter (Signed)
Called patient and he states that the quantity is wrong for the Oxy 10mg . Will ask Dr Andree Elk when he comes and will notify Pharm.  Pt upset stating that his prescriptions are always wrong and that he would just take tylenol until he can get the right prescription. Informed that we would call him back.

## 2018-10-18 NOTE — Telephone Encounter (Signed)
Called and spoke with Pharm and they will change the quantiy per dr Andree Elk to 120 pills

## 2018-10-18 NOTE — Telephone Encounter (Signed)
Called and informed patient that his meds will be filled for quanity of 120.

## 2018-10-18 NOTE — Telephone Encounter (Signed)
Patients wife [name] called stating the scripts for his Oxycodone 10mg  are sent in for the wrong quantity. He is due to pick up today and she would like to pick those up by 2pm or if not possible please give her a call asap

## 2018-10-18 NOTE — Telephone Encounter (Signed)
The pharmacy has a questions about the oxycodone script from Dr. Andree Elk. Please call pharmacy at [phone]

## 2018-10-22 ENCOUNTER — Encounter: Payer: Self-pay | Admitting: Internal Medicine

## 2018-10-22 ENCOUNTER — Ambulatory Visit (INDEPENDENT_AMBULATORY_CARE_PROVIDER_SITE_OTHER): Payer: Medicare Other | Admitting: Internal Medicine

## 2018-10-22 VITALS — BP 142/60 | HR 73 | Temp 97.8°F | Ht 73.0 in | Wt 272.0 lb

## 2018-10-22 DIAGNOSIS — J029 Acute pharyngitis, unspecified: Secondary | ICD-10-CM

## 2018-10-22 DIAGNOSIS — R6889 Other general symptoms and signs: Secondary | ICD-10-CM | POA: Diagnosis not present

## 2018-10-22 LAB — POC INFLUENZA A&B (BINAX/QUICKVUE)
Influenza A, POC: NEGATIVE
Influenza B, POC: NEGATIVE

## 2018-10-22 LAB — POCT RAPID STREP A (OFFICE): Rapid Strep A Screen: NEGATIVE

## 2018-10-22 MED ORDER — AMOXICILLIN-POT CLAVULANATE 875-125 MG PO TABS: 1.0000 | ORAL_TABLET | Freq: Two times a day (BID) | ORAL | 0 refills | Status: DC

## 2018-10-22 MED FILL — AMOX-CLAV 875-125 MG TABLET: 875-125 | 7 days supply | Qty: 14 | Fill #0

## 2018-10-22 NOTE — Progress Notes (Signed)
Pre visit review using our clinic review tool, if applicable. No additional management support is needed unless otherwise documented below in the visit note. 

## 2018-10-22 NOTE — Patient Instructions (Addendum)
Warm salt gargles   Sore Throat A sore throat is pain, burning, irritation, or scratchiness in the throat. When you have a sore throat, you may feel pain or tenderness in your throat when you swallow or talk. Many things can cause a sore throat, including:  An infection.  Seasonal allergies.  Dryness in the air.  Irritants, such as smoke or pollution.  Radiation treatment to the area.  Gastroesophageal reflux disease (GERD).  A tumor. A sore throat is often the first sign of another sickness. It may happen with other symptoms, such as coughing, sneezing, fever, and swollen neck glands. Most sore throats go away without medical treatment. Follow these instructions at home:      Take over-the-counter medicines only as told by your health care provider. ? If your child has a sore throat, do not give your child aspirin because of the association with Reye syndrome.  Drink enough fluids to keep your urine pale yellow.  Rest as needed.  To help with pain, try: ? Sipping warm liquids, such as broth, herbal tea, or warm water. ? Eating or drinking cold or frozen liquids, such as frozen ice pops. ? Gargling with a salt-water mixture 3-4 times a day or as needed. To make a salt-water mixture, completely dissolve -1 tsp (3-6 g) of salt in 1 cup (237 mL) of warm water. ? Sucking on hard candy or throat lozenges. ? Putting a cool-mist humidifier in your bedroom at night to moisten the air. ? Sitting in the bathroom with the door closed for 5-10 minutes while you run hot water in the shower.  Do not use any products that contain nicotine or tobacco, such as cigarettes, e-cigarettes, and chewing tobacco. If you need help quitting, ask your health care provider.  Wash your hands well and often with soap and water. If soap and water are not available, use hand sanitizer. Contact a health care provider if:  You have a fever for more than 2-3 days.  You have symptoms that last (are  persistent) for more than 2-3 days.  Your throat does not get better within 7 days.  You have a fever and your symptoms suddenly get worse.  Your child who is 3 months to 58 years old has a temperature of 102.18F (39C) or higher. Get help right away if:  You have difficulty breathing.  You cannot swallow fluids, soft foods, or your saliva.  You have increased swelling in your throat or neck.  You have persistent nausea and vomiting. Summary  A sore throat is pain, burning, irritation, or scratchiness in the throat. Many things can cause a sore throat.  Take over-the-counter medicines only as told by your health care provider. Do not give your child aspirin.  Drink plenty of fluids, and rest as needed.  Contact a health care provider if your symptoms worsen or your sore throat does not get better within 7 days. This information is not intended to replace advice given to you by your health care provider. Make sure you discuss any questions you have with your health care provider. Document Released: 10/09/2004 Document Revised: 02/01/2018 Document Reviewed: 02/01/2018 Elsevier Interactive Patient Education  2019 [employer].
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
## 2018-11-08 ENCOUNTER — Encounter: Payer: Self-pay | Admitting: Emergency Medicine

## 2018-11-08 ENCOUNTER — Ambulatory Visit
Admission: EM | Admit: 2018-11-08 | Discharge: 2018-11-08 | Disposition: A | Discharge status: Home / Self Care | Payer: Medicare Other | Attending: Nurse Practitioner | Admitting: Nurse Practitioner

## 2018-11-08 DIAGNOSIS — L02212 Cutaneous abscess of back [any part, except buttock]: Secondary | ICD-10-CM | POA: Diagnosis not present

## 2018-11-08 MED ORDER — DOXYCYCLINE HYCLATE 100 MG PO CAPS: 100.0000 mg | ORAL_CAPSULE | Freq: Two times a day (BID) | ORAL | 0 refills | Status: DC

## 2018-11-08 MED FILL — DOXYCYCLINE HYC 100 MG CAPS: 100 | 10 days supply | Qty: 20 | Fill #0

## 2018-11-08 NOTE — Discharge Instructions (Signed)
Take medications as prescribed. Leave packing in place. You may change the top dressing as needed.

## 2018-11-08 NOTE — ED Notes (Signed)
Patient able to ambulate independently  
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
## 2018-11-08 NOTE — ED Notes (Signed)
Explained delay and offered water to patient.

## 2018-11-08 NOTE — ED Triage Notes (Signed)
Pt presents after his PCP mashes on a spot on his back that was small approx 2 weeks ago.  Now it is red, inflamed, leaking, pustulant discharge.

## 2018-11-10 ENCOUNTER — Ambulatory Visit
Admission: EM | Admit: 2018-11-10 | Discharge: 2018-11-10 | Disposition: A | Discharge status: Home / Self Care | Payer: Medicare Other

## 2018-11-10 ENCOUNTER — Other Ambulatory Visit: Payer: Self-pay | Admitting: Anesthesiology

## 2018-11-10 DIAGNOSIS — Z4800 Encounter for change or removal of nonsurgical wound dressing: Secondary | ICD-10-CM

## 2018-11-10 DIAGNOSIS — L02212 Cutaneous abscess of back [any part, except buttock]: Secondary | ICD-10-CM

## 2018-11-10 DIAGNOSIS — Z5189 Encounter for other specified aftercare: Secondary | ICD-10-CM

## 2018-11-10 DIAGNOSIS — G894 Chronic pain syndrome: Secondary | ICD-10-CM

## 2018-11-10 NOTE — ED Triage Notes (Signed)
Pt here on Monday and had abscess opened and packed, here for recheck.
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
## 2018-11-10 NOTE — Discharge Instructions (Signed)
Wash with soap and water daily.  Keep covered to keep clean and collect and drainage.  Continue with antibiotics as prescribed.  Warm compresses 2-4 times a day to promote drainage.  If symptoms worsen or do not improve in the next week to return to be seen or to follow up with your PCP.

## 2018-11-11 LAB — AEROBIC CULTURE  (SUPERFICIAL SPECIMEN)

## 2018-11-11 LAB — AEROBIC CULTURE W GRAM STAIN (SUPERFICIAL SPECIMEN): Culture: NO GROWTH

## 2018-11-12 ENCOUNTER — Other Ambulatory Visit: Payer: Self-pay | Admitting: Anesthesiology

## 2018-11-12 DIAGNOSIS — G894 Chronic pain syndrome: Secondary | ICD-10-CM

## 2018-11-12 MED ORDER — PREGABALIN 100 MG PO CAPS: 100.0000 mg | ORAL_CAPSULE | Freq: Three times a day (TID) | ORAL | 2 refills | Status: DC

## 2018-11-12 MED ORDER — OXYCODONE HCL 10 MG PO TABS: 10.0000 mg | ORAL_TABLET | Freq: Four times a day (QID) | ORAL | 0 refills | Status: DC

## 2018-11-12 MED FILL — PREGABALIN 100 MG CAPS: 100 | 30 days supply | Qty: 90 | Fill #0 | Status: TO

## 2018-11-12 NOTE — Telephone Encounter (Signed)
Patient's wife [name] called to check on scripts for meds Lyrica and Oxycodone. They filled the scripts for January and called pharmacy to get next script ready to pick up when due. Pharmacy says they have not scripts on file. Please call pharmacy and then let patient know as he will need to pick up for March.  Thank you

## 2018-11-12 NOTE — Addendum Note (Signed)
Addended by: Molli Barrows on: 11/12/2018 02:27 PM   Modules accepted: Orders

## 2018-11-15 NOTE — Telephone Encounter (Signed)
Patient's pharmacy called and verified that patient has refills of both. Patient notified.

## 2018-11-17 MED FILL — oxyCODONE HCL 10 MG TABS: 10 | 30 days supply | Qty: 120 | Fill #0

## 2018-11-29 MED FILL — HUMULIN R 500 UNITS/ML KWIK: 500 | 28 days supply | Qty: 24 | Fill #2

## 2018-11-29 MED FILL — FUROSEMIDE 40 MG TAB: 40 | 90 days supply | Qty: 90 | Fill #2

## 2018-12-01 ENCOUNTER — Encounter: Payer: Self-pay | Admitting: Anesthesiology

## 2018-12-01 ENCOUNTER — Other Ambulatory Visit: Payer: Self-pay

## 2018-12-01 ENCOUNTER — Ambulatory Visit: Payer: Medicare Other | Attending: Anesthesiology | Admitting: Anesthesiology

## 2018-12-01 VITALS — BP 146/57 | HR 76 | Temp 98.2°F | Resp 18 | Ht 73.0 in | Wt 272.0 lb

## 2018-12-01 DIAGNOSIS — M5134 Other intervertebral disc degeneration, thoracic region: Secondary | ICD-10-CM | POA: Diagnosis not present

## 2018-12-01 DIAGNOSIS — M961 Postlaminectomy syndrome, not elsewhere classified: Secondary | ICD-10-CM

## 2018-12-01 DIAGNOSIS — R531 Weakness: Secondary | ICD-10-CM

## 2018-12-01 DIAGNOSIS — G894 Chronic pain syndrome: Secondary | ICD-10-CM

## 2018-12-01 DIAGNOSIS — F1190 Opioid use, unspecified, uncomplicated: Secondary | ICD-10-CM

## 2018-12-01 DIAGNOSIS — M542 Cervicalgia: Secondary | ICD-10-CM

## 2018-12-01 DIAGNOSIS — M25512 Pain in left shoulder: Secondary | ICD-10-CM

## 2018-12-01 DIAGNOSIS — M25511 Pain in right shoulder: Secondary | ICD-10-CM

## 2018-12-01 MED ORDER — OXYCODONE HCL 10 MG PO TABS: 10.0000 mg | ORAL_TABLET | Freq: Four times a day (QID) | ORAL | 0 refills | Status: DC

## 2018-12-01 MED ORDER — OXYCODONE-ACETAMINOPHEN 10-325 MG PO TABS: 1.0000 | ORAL_TABLET | Freq: Four times a day (QID) | ORAL | 0 refills | Status: DC | PRN

## 2018-12-01 NOTE — Progress Notes (Signed)
Subjective:  Patient ID: [name], male    DOB: 03/10/1951  Age: 68 y.o. MRN: [id number]  CC: Medication Refill; Back Pain; Shoulder Pain; and Knee Pain   Procedure: None  HPI [name] presents for reevaluation.  His last seen 2 months ago and has been doing well in regards to his low back pain bilateral shoulder pain and lower leg pain.  He is taking his medications as prescribed with no untoward side effects.  I have reviewed his narcotic assessment sheet and he is doing well with good relief and deriving good symptom improvement.  He rates his pain as reasonably well controlled.  No untoward side effects are mentioned.  The quality characteristic distribution of his pain are otherwise unchanged.  Outpatient Medications Prior to Visit  Medication Sig Dispense Refill  . acetaminophen (TYLENOL) 500 MG tablet Take 500-1,000 mg by mouth every 6 (six) hours as needed for headache.     . albuterol (PROVENTIL HFA;VENTOLIN HFA) 108 (90 Base) MCG/ACT inhaler Inhale 1-2 puffs into the lungs every 6 (six) hours as needed for wheezing or shortness of breath.    Marland Kitchen atorvastatin (LIPITOR) 10 MG tablet Take 1 tablet (10 mg total) by mouth daily at 6 PM. 90 tablet 3  . Continuous Blood Gluc Sensor (FREESTYLE LIBRE 14 DAY SENSOR) MISC U Q 14 DAYS UTD  5  . diphenhydrAMINE (BENADRYL) 25 mg capsule Take 25 mg by mouth 3 (three) times daily as needed for itching.     . docusate calcium (V-R DOCUSATE CALCIUM) 240 MG capsule Take 240 mg by mouth 2 (two) times daily as needed for mild constipation.    Marland Kitchen doxycycline (VIBRAMYCIN) 100 MG capsule Take 1 capsule (100 mg total) by mouth 2 (two) times daily. 20 capsule 0  . furosemide (LASIX) 40 MG tablet Take 1 tablet (40 mg total) by mouth daily. 90 tablet 3  . glucose blood (FREESTYLE LITE) test strip Use as instructed 400 each 3  . glucose blood test strip Check qid Use as instructed 400 each 12  . HUMULIN R U-500 KWIKPEN 500 UNIT/ML kwikpen  Inject 0-125 Units into the skin 4 (four) times daily -  with meals and at bedtime. Sliding scale: (u500 insulin) Breakfast: 70-90: 51u, 91-130: 85u, 131-150: 90u, 151-200: 95u, 201-250: 100u, 251-300: 105u, 301-350: 110u, 351-400: 115u, 401-450: 120u, >450: 125u Lunch:  70-90: 45u, 91-130: 75u, 131-150: 80u, 151-200: 85u, 201-250: 90u, 251-300: 95u, 301-350: 100u, 351-400: 105u, 401-450: 110u, >450: 115u Supper: 70-90: 42u, 91-130: 70u, 131-150: 75u, 151-200: 80u, 201-250: 85u, 251-300: 90u, 301-350: 95u, 351-400: 100u, 401-450: 105u, >450: 110u Nighttime:  70-90: 0u, 91-130: 0u, 131-150: 0u, 151-200: 0u, 201-250: 0u, 251-300: 1u, 301-350: 2u, 351-400: 3u, 401-450: 4u, >450: 5u  11  . Lancets (FREESTYLE) lancets Qid check glucoseUse as instructed 400 each 3  . levocetirizine (XYZAL) 5 MG tablet Take 5 mg by mouth daily as needed for allergies.     Marland Kitchen linaclotide (LINZESS) 72 MCG capsule Take 1 capsule (72 mcg total) by mouth daily before breakfast. (Patient taking differently: Take 72 mcg by mouth daily as needed (constipation.). ) 90 capsule 3  . LORazepam (ATIVAN) 0.5 MG tablet Take 0.5 mg by mouth 3 (three) times daily as needed for anxiety.    Marland Kitchen losartan (COZAAR) 25 MG tablet Take 0.5 tablets (12.5 mg total) by mouth daily. 45 tablet 2  . mupirocin ointment (BACTROBAN) 2 % Apply 1 application topically 2 (two) times daily. (Patient taking  differently: Apply 1 application topically 2 (two) times daily as needed (sores). ) 30 g 0  . naloxone (NARCAN) nasal spray 4 mg/0.1 mL As directed for opioid induced respiratory depression 1 kit 1  . nystatin (MYCOSTATIN) 100000 UNIT/ML suspension Take 5 mLs by mouth 4 (four) times daily as needed. For mouth sores.    . nystatin (MYCOSTATIN/NYSTOP) powder Apply 1 application topically 2 (two) times daily as needed. For yeast around skin folds    . ondansetron (ZOFRAN-ODT) 4 MG disintegrating tablet Take 1 tablet (4 mg total) by mouth 2 (two) times daily as needed  for nausea or vomiting. 60 tablet 1  . OVER THE COUNTER MEDICATION Take 1 packet by mouth daily. Crystal Light Lemonade Daily    . pantoprazole (PROTONIX) 40 MG tablet Take 40 mg by mouth 2 (two) times daily.   1  . pregabalin (LYRICA) 100 MG capsule Take 1 capsule (100 mg total) by mouth 3 (three) times daily for 30 days. 90 capsule 2  . promethazine (PHENERGAN) 25 MG tablet Take 1 tablet (25 mg total) by mouth every 8 (eight) hours as needed for nausea or vomiting. 90 tablet 1  . tamsulosin (FLOMAX) 0.4 MG CAPS capsule Take 1 capsule (0.4 mg total) by mouth at bedtime. 30 capsule 0  . traZODone (DESYREL) 100 MG tablet Take 1 tablet (100 mg total) by mouth at bedtime. 90 tablet 3  . triamcinolone cream (KENALOG) 0.1 % Apply 1 application topically 2 (two) times daily as needed (rash/itching).    . Oxycodone HCl 10 MG TABS Take 1 tablet (10 mg total) by mouth 4 (four) times daily for 30 days. 120 tablet 0   No facility-administered medications prior to visit.     Review of Systems CNS: No confusion or sedation Cardiac: No angina or palpitations GI: No abdominal pain or constipation Constitutional: No nausea vomiting fevers or chills  Objective:  BP (!) 146/57   Pulse 76   Temp 98.2 F (36.8 C)   Resp 18   Ht '6\' 1"'  (1.854 m)   Wt 272 lb (123.4 kg)   SpO2 100%   BMI 35.89 kg/m    BP Readings from Last 3 Encounters:  12/01/18 (!) 146/57  11/10/18 (!) 147/66  11/08/18 (!) 136/59     Wt Readings from Last 3 Encounters:  12/01/18 272 lb (123.4 kg)  10/22/18 272 lb (123.4 kg)  10/07/18 272 lb (123.4 kg)     Physical Exam Pt is alert and oriented PERRL EOMI HEART IS RRR no murmur or rub LCTA no wheezing or rales MUSCULOSKELETAL reveals patient to be in a wheelchair.  He moves about the wheelchair without difficulty and his muscle tone and bulk to the lower back and legs is without documented change.  Labs  Lab Results  Component Value Date   HGBA1C 6.7 (H) 08/25/2018    HGBA1C 7.8 (H) 04/02/2018   HGBA1C 7.3 (H) 12/06/2015   Lab Results  Component Value Date   MICROALBUR 8.1 07/16/2015   LDLCALC 50 12/06/2015   CREATININE 0.94 09/21/2018    -------------------------------------------------------------------------------------------------------------------- Lab Results  Component Value Date   WBC 4.6 09/21/2018   HGB 12.3 (L) 09/21/2018   HCT 37.3 (L) 09/21/2018   PLT 253 09/21/2018   GLUCOSE 258 (H) 09/21/2018   CHOL 114 04/02/2018   TRIG 223.0 (H) 04/02/2018   HDL 21.30 (L) 04/02/2018   LDLDIRECT 76.0 04/02/2018   LDLCALC 50 12/06/2015   ALT 16 04/02/2018   AST 15 04/02/2018  NA 140 09/21/2018   K 3.9 09/21/2018   CL 106 09/21/2018   CREATININE 0.94 09/21/2018   BUN 6 (L) 09/21/2018   CO2 28 09/21/2018   TSH 1.22 03/25/2012   PSA 0.40 03/25/2012   INR 1.01 01/28/2016   HGBA1C 6.7 (H) 08/25/2018   MICROALBUR 8.1 07/16/2015    --------------------------------------------------------------------------------------------------------------------- No results found.   Assessment & Plan:   Ronte was seen today for medication refill, back pain, shoulder pain and knee pain.  Diagnoses and all orders for this visit:  DDD (degenerative disc disease), thoracic  Right sided weakness  Failed back surgical syndrome  Chronic pain syndrome  Chronic, continuous use of opioids  Neck pain  Pain of both shoulder joints  Other orders -     Oxycodone HCl 10 MG TABS; Take 1 tablet (10 mg total) by mouth 4 (four) times daily for 30 days. -     Discontinue: oxyCODONE-acetaminophen (PERCOCET) 10-325 MG tablet; Take 1 tablet by mouth every 6 (six) hours as needed for up to 30 days for pain. -     Oxycodone HCl 10 MG TABS; Take 1 tablet (10 mg total) by mouth 4 (four) times daily for 30 days.        ----------------------------------------------------------------------------------------------------------------------  Problem List Items  Addressed This Visit      Unprioritized   Chronic pain syndrome   DDD (degenerative disc disease), thoracic - Primary   Relevant Medications   Oxycodone HCl 10 MG TABS (Start on 12/14/2018)   Oxycodone HCl 10 MG TABS (Start on 01/13/2019)   Right sided weakness   SHOULDER PAIN, BILATERAL    Other Visit Diagnoses    Failed back surgical syndrome       Relevant Medications   Oxycodone HCl 10 MG TABS (Start on 12/14/2018)   Oxycodone HCl 10 MG TABS (Start on 01/13/2019)   Chronic, continuous use of opioids       Neck pain            ----------------------------------------------------------------------------------------------------------------------  1. DDD (degenerative disc disease), thoracic We will defer on any changes today.  He seems to be managing his pain well with her current regimen.  I am going to refill his prescriptions for oxycodone 10 mg tablets to be dated on March 31 and April 30.  We will schedule him for return to clinic in 2 months.  I have reviewed the [facility] practitioner database information and it is appropriate.  2. Right sided weakness Continue follow-up with his primary care physicians and other medical doctors with return as mentioned.  3. Failed back surgical syndrome As above  4. Chronic pain syndrome As above  5. Chronic, continuous use of opioids As above  6. Neck pain   7. Pain of both shoulder joints     ----------------------------------------------------------------------------------------------------------------------  I have discontinued [name]'s oxyCODONE-acetaminophen. I am also having him start on Oxycodone HCl. Additionally, I am having him maintain his HumuLIN R U-500 KwikPen, diphenhydrAMINE, LORazepam, acetaminophen, nystatin, nystatin, linaclotide, mupirocin ointment, glucose blood, glucose blood, freestyle, levocetirizine, furosemide, ondansetron, traZODone, atorvastatin, promethazine, pantoprazole,  FreeStyle Libre 14 Day Sensor, docusate calcium, albuterol, triamcinolone cream, OVER THE COUNTER MEDICATION, tamsulosin, losartan, naloxone, doxycycline, pregabalin, and Oxycodone HCl.   Meds ordered this encounter  Medications  . Oxycodone HCl 10 MG TABS    Sig: Take 1 tablet (10 mg total) by mouth 4 (four) times daily for 30 days.    Dispense:  120 tablet    Refill:  0  30 day supply  . DISCONTD: oxyCODONE-acetaminophen (PERCOCET) 10-325 MG tablet    Sig: Take 1 tablet by mouth every 6 (six) hours as needed for up to 30 days for pain.    Dispense:  120 tablet    Refill:  0    30 day supply  . Oxycodone HCl 10 MG TABS    Sig: Take 1 tablet (10 mg total) by mouth 4 (four) times daily for 30 days.    Dispense:  120 tablet    Refill:  0    30 day supply   Patient's Medications  New Prescriptions   OXYCODONE HCL 10 MG TABS    Take 1 tablet (10 mg total) by mouth 4 (four) times daily for 30 days.  Previous Medications   ACETAMINOPHEN (TYLENOL) 500 MG TABLET    Take 500-1,000 mg by mouth every 6 (six) hours as needed for headache.    ALBUTEROL (PROVENTIL HFA;VENTOLIN HFA) 108 (90 BASE) MCG/ACT INHALER    Inhale 1-2 puffs into the lungs every 6 (six) hours as needed for wheezing or shortness of breath.   ATORVASTATIN (LIPITOR) 10 MG TABLET    Take 1 tablet (10 mg total) by mouth daily at 6 PM.   CONTINUOUS BLOOD GLUC SENSOR (FREESTYLE LIBRE 14 DAY SENSOR) MISC    U Q 14 DAYS UTD   DIPHENHYDRAMINE (BENADRYL) 25 MG CAPSULE    Take 25 mg by mouth 3 (three) times daily as needed for itching.    DOCUSATE CALCIUM (V-R DOCUSATE CALCIUM) 240 MG CAPSULE    Take 240 mg by mouth 2 (two) times daily as needed for mild constipation.   DOXYCYCLINE (VIBRAMYCIN) 100 MG CAPSULE    Take 1 capsule (100 mg total) by mouth 2 (two) times daily.   FUROSEMIDE (LASIX) 40 MG TABLET    Take 1 tablet (40 mg total) by mouth daily.   GLUCOSE BLOOD (FREESTYLE LITE) TEST STRIP    Use as instructed   GLUCOSE BLOOD  TEST STRIP    Check qid Use as instructed   HUMULIN R U-500 KWIKPEN 500 UNIT/ML KWIKPEN    Inject 0-125 Units into the skin 4 (four) times daily -  with meals and at bedtime. Sliding scale: (u500 insulin) Breakfast: 70-90: 51u, 91-130: 85u, 131-150: 90u, 151-200: 95u, 201-250: 100u, 251-300: 105u, 301-350: 110u, 351-400: 115u, 401-450: 120u, >450: 125u Lunch:  70-90: 45u, 91-130: 75u, 131-150: 80u, 151-200: 85u, 201-250: 90u, 251-300: 95u, 301-350: 100u, 351-400: 105u, 401-450: 110u, >450: 115u Supper: 70-90: 42u, 91-130: 70u, 131-150: 75u, 151-200: 80u, 201-250: 85u, 251-300: 90u, 301-350: 95u, 351-400: 100u, 401-450: 105u, >450: 110u Nighttime:  70-90: 0u, 91-130: 0u, 131-150: 0u, 151-200: 0u, 201-250: 0u, 251-300: 1u, 301-350: 2u, 351-400: 3u, 401-450: 4u, >450: 5u   LANCETS (FREESTYLE) LANCETS    Qid check glucoseUse as instructed   LEVOCETIRIZINE (XYZAL) 5 MG TABLET    Take 5 mg by mouth daily as needed for allergies.    LINACLOTIDE (LINZESS) 72 MCG CAPSULE    Take 1 capsule (72 mcg total) by mouth daily before breakfast.   LORAZEPAM (ATIVAN) 0.5 MG TABLET    Take 0.5 mg by mouth 3 (three) times daily as needed for anxiety.   LOSARTAN (COZAAR) 25 MG TABLET    Take 0.5 tablets (12.5 mg total) by mouth daily.   MUPIROCIN OINTMENT (BACTROBAN) 2 %    Apply 1 application topically 2 (two) times daily.   NALOXONE (NARCAN) NASAL SPRAY 4 MG/0.1 ML    As directed for  opioid induced respiratory depression   NYSTATIN (MYCOSTATIN) 100000 UNIT/ML SUSPENSION    Take 5 mLs by mouth 4 (four) times daily as needed. For mouth sores.   NYSTATIN (MYCOSTATIN/NYSTOP) POWDER    Apply 1 application topically 2 (two) times daily as needed. For yeast around skin folds   ONDANSETRON (ZOFRAN-ODT) 4 MG DISINTEGRATING TABLET    Take 1 tablet (4 mg total) by mouth 2 (two) times daily as needed for nausea or vomiting.   OVER THE COUNTER MEDICATION    Take 1 packet by mouth daily. Crystal Light Lemonade Daily   PANTOPRAZOLE  (PROTONIX) 40 MG TABLET    Take 40 mg by mouth 2 (two) times daily.    PREGABALIN (LYRICA) 100 MG CAPSULE    Take 1 capsule (100 mg total) by mouth 3 (three) times daily for 30 days.   PROMETHAZINE (PHENERGAN) 25 MG TABLET    Take 1 tablet (25 mg total) by mouth every 8 (eight) hours as needed for nausea or vomiting.   TAMSULOSIN (FLOMAX) 0.4 MG CAPS CAPSULE    Take 1 capsule (0.4 mg total) by mouth at bedtime.   TRAZODONE (DESYREL) 100 MG TABLET    Take 1 tablet (100 mg total) by mouth at bedtime.   TRIAMCINOLONE CREAM (KENALOG) 0.1 %    Apply 1 application topically 2 (two) times daily as needed (rash/itching).  Modified Medications   Modified Medication Previous Medication   OXYCODONE HCL 10 MG TABS Oxycodone HCl 10 MG TABS      Take 1 tablet (10 mg total) by mouth 4 (four) times daily for 30 days.    Take 1 tablet (10 mg total) by mouth 4 (four) times daily for 30 days.  Discontinued Medications   No medications on file   ----------------------------------------------------------------------------------------------------------------------  Follow-up: Return in about 2 months (around 01/31/2019) for evaluation, med refill.    Molli Barrows, MD

## 2018-12-01 NOTE — Progress Notes (Signed)
Nursing Pain Medication Assessment:  Safety precautions to be maintained throughout the outpatient stay will include: orient to surroundings, keep bed in low position, maintain call bell within reach at all times, provide assistance with transfer out of bed and ambulation.  Medication Inspection Compliance: Mr. Dunklee did not comply with our request to bring his pills to be counted. He was reminded that bringing the medication bottles, even when empty, is a requirement.  Medication: None brought in. Pill/Patch Count: None available to be counted. Bottle Appearance: No container available. Did not bring bottle(s) to appointment. Filled Date: N/A Last Medication intake:  Today

## 2018-12-06 DIAGNOSIS — N200 Calculus of kidney: Secondary | ICD-10-CM | POA: Diagnosis not present

## 2018-12-07 ENCOUNTER — Emergency Department (HOSPITAL_COMMUNITY)
Admission: EM | Admit: 2018-12-07 | Discharge: 2018-12-07 | Discharge status: Against Medical Advice | Payer: Medicare Other | Attending: Emergency Medicine | Admitting: Emergency Medicine

## 2018-12-07 ENCOUNTER — Other Ambulatory Visit: Payer: Self-pay

## 2018-12-07 ENCOUNTER — Other Ambulatory Visit: Payer: Self-pay | Admitting: Urology

## 2018-12-07 ENCOUNTER — Emergency Department (HOSPITAL_COMMUNITY): Payer: Medicare Other

## 2018-12-07 DIAGNOSIS — E119 Type 2 diabetes mellitus without complications: Secondary | ICD-10-CM | POA: Insufficient documentation

## 2018-12-07 DIAGNOSIS — Z89511 Acquired absence of right leg below knee: Secondary | ICD-10-CM | POA: Insufficient documentation

## 2018-12-07 DIAGNOSIS — I5032 Chronic diastolic (congestive) heart failure: Secondary | ICD-10-CM | POA: Diagnosis not present

## 2018-12-07 DIAGNOSIS — R109 Unspecified abdominal pain: Secondary | ICD-10-CM | POA: Diagnosis not present

## 2018-12-07 DIAGNOSIS — N200 Calculus of kidney: Secondary | ICD-10-CM | POA: Diagnosis not present

## 2018-12-07 DIAGNOSIS — Z794 Long term (current) use of insulin: Secondary | ICD-10-CM | POA: Insufficient documentation

## 2018-12-07 DIAGNOSIS — Z87891 Personal history of nicotine dependence: Secondary | ICD-10-CM | POA: Diagnosis not present

## 2018-12-07 DIAGNOSIS — Z79899 Other long term (current) drug therapy: Secondary | ICD-10-CM | POA: Diagnosis not present

## 2018-12-07 DIAGNOSIS — Z5329 Procedure and treatment not carried out because of patient's decision for other reasons: Secondary | ICD-10-CM | POA: Insufficient documentation

## 2018-12-07 DIAGNOSIS — I110 Hypertensive heart disease with heart failure: Secondary | ICD-10-CM | POA: Insufficient documentation

## 2018-12-07 DIAGNOSIS — R509 Fever, unspecified: Secondary | ICD-10-CM | POA: Diagnosis not present

## 2018-12-07 DIAGNOSIS — R1084 Generalized abdominal pain: Secondary | ICD-10-CM | POA: Diagnosis not present

## 2018-12-07 DIAGNOSIS — F419 Anxiety disorder, unspecified: Secondary | ICD-10-CM | POA: Diagnosis not present

## 2018-12-07 DIAGNOSIS — I2510 Atherosclerotic heart disease of native coronary artery without angina pectoris: Secondary | ICD-10-CM | POA: Diagnosis not present

## 2018-12-07 DIAGNOSIS — J449 Chronic obstructive pulmonary disease, unspecified: Secondary | ICD-10-CM | POA: Insufficient documentation

## 2018-12-07 DIAGNOSIS — N201 Calculus of ureter: Secondary | ICD-10-CM | POA: Diagnosis not present

## 2018-12-07 DIAGNOSIS — Z85828 Personal history of other malignant neoplasm of skin: Secondary | ICD-10-CM | POA: Diagnosis not present

## 2018-12-07 LAB — COMPREHENSIVE METABOLIC PANEL
ALT: 14 U/L (ref 0–44)
ANION GAP: 10 (ref 5–15)
AST: 24 U/L (ref 15–41)
Albumin: 3.8 g/dL (ref 3.5–5.0)
Alkaline Phosphatase: 141 U/L — ABNORMAL HIGH (ref 38–126)
BUN: 9 mg/dL (ref 8–23)
CO2: 20 mmol/L — ABNORMAL LOW (ref 22–32)
Calcium: 10 mg/dL (ref 8.9–10.3)
Chloride: 103 mmol/L (ref 98–111)
Creatinine, Ser: 1.13 mg/dL (ref 0.61–1.24)
GFR calc Af Amer: >60 mL/min (ref >60)
GFR calc non Af Amer: >60 mL/min (ref >60)
GLUCOSE: 275 mg/dL — AB (ref 70–99)
Potassium: 4.5 mmol/L (ref 3.5–5.1)
Sodium: 133 mmol/L — ABNORMAL LOW (ref 135–145)
Total Bilirubin: 1.7 mg/dL — ABNORMAL HIGH (ref 0.3–1.2)
Total Protein: 6.9 g/dL (ref 6.5–8.1)

## 2018-12-07 LAB — URINALYSIS, ROUTINE W REFLEX MICROSCOPIC
Bilirubin Urine: NEGATIVE
Glucose, UA: >=500 mg/dL — AB
Hgb urine dipstick: NEGATIVE
Ketones, ur: 20 mg/dL — AB
Leukocytes,Ua: NEGATIVE
Nitrite: NEGATIVE
Protein, ur: NEGATIVE mg/dL
Specific Gravity, Urine: 1.013 (ref 1.005–1.030)
pH: 8 (ref 5.0–8.0)

## 2018-12-07 LAB — CBC WITH DIFFERENTIAL/PLATELET
Abs Immature Granulocytes: 0.07 10*3/uL (ref 0.00–0.07)
Basophils Absolute: 0.1 10*3/uL (ref 0.0–0.1)
Basophils Relative: 0 %
Eosinophils Absolute: 0 10*3/uL (ref 0.0–0.5)
Eosinophils Relative: 0 %
HEMATOCRIT: 38.6 % — AB (ref 39.0–52.0)
Hemoglobin: 12.8 g/dL — ABNORMAL LOW (ref 13.0–17.0)
Immature Granulocytes: 0 %
Lymphocytes Relative: 15 %
Lymphs Abs: 2.6 10*3/uL (ref 0.7–4.0)
MCH: 34.6 pg — ABNORMAL HIGH (ref 26.0–34.0)
MCHC: 33.2 g/dL (ref 30.0–36.0)
MCV: 104.3 fL — AB (ref 80.0–100.0)
Monocytes Absolute: 1.4 10*3/uL — ABNORMAL HIGH (ref 0.1–1.0)
Monocytes Relative: 8 %
Neutro Abs: 13 10*3/uL — ABNORMAL HIGH (ref 1.7–7.7)
Neutrophils Relative %: 77 %
PLATELETS: 274 10*3/uL (ref 150–400)
RBC: 3.7 MIL/uL — ABNORMAL LOW (ref 4.22–5.81)
RDW: 16.5 % — AB (ref 11.5–15.5)
WBC: 17.1 10*3/uL — ABNORMAL HIGH (ref 4.0–10.5)
nRBC: 0 % (ref 0.0–0.2)

## 2018-12-07 LAB — INFLUENZA PANEL BY PCR (TYPE A & B)
Influenza A By PCR: NEGATIVE
Influenza B By PCR: NEGATIVE

## 2018-12-07 MED ORDER — AMOXICILLIN-POT CLAVULANATE 875-125 MG PO TABS: 1.0000 | ORAL_TABLET | Freq: Two times a day (BID) | ORAL | 0 refills | Status: DC

## 2018-12-07 MED ORDER — SODIUM CHLORIDE 0.9% FLUSH
3.0000 mL | Freq: Once | INTRAVENOUS | Status: AC
  Administered 2018-12-07: 3 mL via INTRAVENOUS
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
## 2018-12-07 NOTE — ED Triage Notes (Signed)
Pt c/o of fever, chills, sob for about 3 weeks. T max of 100.4.

## 2018-12-07 NOTE — ED Notes (Signed)
MD went in to examine patient who had taken off all leads and BP cuff. Patient sitting on the side of the bed requesting to leave at this time. States "I feel better. I took my oxycodone before I left home and it's starting to work".   Patient agrees to wait until MD speaks to patient's wife who is outside in the car.

## 2018-12-07 NOTE — ED Notes (Signed)
This RN came around the corner to see pt sitting in hall on rolley chair. Pt stated he was leaving. This RN educated the pt about the risks of leaving and that it would be advised to wait for the results of his scan. Pt became agitated and this RN told pt that at minimum he must move into a wheelchair for safety purposes. Security called by Network engineer and MD advised. Pt continued to be adamant about leaving and was discharged with antibiotics after MD contacted wife

## 2018-12-08 LAB — URINE CULTURE: Culture: >=100000 — AB

## 2018-12-09 ENCOUNTER — Telehealth: Payer: Self-pay | Admitting: *Deleted

## 2018-12-09 NOTE — Telephone Encounter (Signed)
Post ED Visit - Positive Culture Follow-up  Culture report reviewed by antimicrobial stewardship pharmacist: [place] Team []  Elenor Quinones, Pharm.D. []  Heide Guile, Pharm.D., BCPS AQ-ID []  Parks Neptune, Pharm.D., BCPS []  Alycia Rossetti, Pharm.D., BCPS []  [place], Pharm.D., BCPS, AAHIVP []  Legrand [place], Pharm.D., BCPS, AAHIVP []  Salome Arnt, PharmD, BCPS []  Johnnette Gourd, PharmD, BCPS [x]  Hughes Better, PharmD, BCPS []  Leeroy Cha, PharmD []  Laqueta Linden, PharmD, BCPS []  Albertina Parr, PharmD  [place] Team []  Leodis Sias, PharmD []  Lindell Spar, PharmD []  Royetta Asal, PharmD []  Graylin Shiver, Rph []  Rema Fendt) Glennon Mac, PharmD []  Arlyn Dunning, PharmD []  Netta Cedars, PharmD []  Dia Sitter, PharmD []  Leone Haven, PharmD []  Gretta Arab, PharmD []  Theodis Shove, PharmD []  Peggyann Juba, PharmD []  Reuel Boom, PharmD   Positive urine culture Treated with Amoxicillin, organism sensitive to the same and no further patient follow-up is required at this time.  Harlon Flor [facility] 12/09/2018, 7:59 AM

## 2018-12-12 LAB — CULTURE, BLOOD (ROUTINE X 2)
Culture: NO GROWTH
Culture: NO GROWTH
Special Requests: ADEQUATE
Special Requests: ADEQUATE

## 2018-12-14 MED FILL — LOSARTAN POTASSIUM 25 MG TA: 25 | 90 days supply | Qty: 45 | Fill #0

## 2018-12-14 MED FILL — PREGABALIN 100 MG CAPS: 100 | 30 days supply | Qty: 90 | Fill #0

## 2018-12-15 ENCOUNTER — Telehealth: Payer: Self-pay | Admitting: Anesthesiology

## 2018-12-15 NOTE — Telephone Encounter (Signed)
Pts wife called and stated that pts rx for oxycodone needs to be sent to [facility] out patient pharmacy bc [facility] is closed down due to the coronavirus

## 2018-12-16 MED ORDER — OXYCODONE HCL 10 MG PO TABS: 10.0000 mg | ORAL_TABLET | Freq: Four times a day (QID) | ORAL | 0 refills | Status: DC

## 2018-12-16 MED ORDER — OXYCODONE HCL 10 MG PO TABS: 10.0000 mg | ORAL_TABLET | Freq: Four times a day (QID) | ORAL | 0 refills | Status: AC

## 2018-12-16 MED FILL — oxyCODONE HCL 10 MG TABS: 10 | 30 days supply | Qty: 120 | Fill #0

## 2018-12-16 NOTE — Telephone Encounter (Signed)
Please see nursing note from [place] below.  Prescriptions called in for patient to updated pharmacy.  [place] PMP checked.    11/17/2018  1  11/12/2018  Oxycodone Hcl 10 Mg Tablet  120.00 30 Ja Ada  [id number]  Mos ([id number])  0/0 60.00 MME Comm Ins  [place]  Requested Prescriptions   Signed Prescriptions Disp Refills  . Oxycodone HCl 10 MG TABS 120 tablet 0    Sig: Take 1 tablet (10 mg total) by mouth 4 (four) times daily for 30 days.    Authorizing Provider: Gillis Santa  . Oxycodone HCl 10 MG TABS 120 tablet 0    Sig: Take 1 tablet (10 mg total) by mouth 4 (four) times daily for 30 days.    Authorizing Provider: Gillis Santa

## 2018-12-16 NOTE — Telephone Encounter (Signed)
I have spoken with Dr. Andree Elk and he has requested that Dr. Holley Raring e-scribe in his absence. Dr. Holley Raring has agreed.  Dr. Holley Raring,                 I have called and had the [place] original scripts.Please see medication list for date fills and doses. Only the oxycodone dated to fill 03/31 and 04/30 need to be e-scribed. Thank you for your assistance. Dr. Andree Elk appreciates it.                                             Anderson Malta, RN

## 2018-12-23 DIAGNOSIS — R3912 Poor urinary stream: Secondary | ICD-10-CM | POA: Diagnosis not present

## 2018-12-23 DIAGNOSIS — N200 Calculus of kidney: Secondary | ICD-10-CM | POA: Diagnosis not present

## 2019-01-04 MED FILL — traZODone HCL 100 MG TABS: 100 | 90 days supply | Qty: 90 | Fill #0

## 2019-01-13 MED FILL — oxyCODONE HCL 10 MG TABS: 10 | 30 days supply | Qty: 120 | Fill #0

## 2019-01-13 MED FILL — PREGABALIN 100 MG CAPS: 100 | 30 days supply | Qty: 90 | Fill #1

## 2019-01-13 MED FILL — PROMETHAZINE 25 MG TABLET: 25 | 30 days supply | Qty: 90 | Fill #0

## 2019-01-14 MED FILL — HUMULIN R 500 UNITS/ML KWIK: 500 | 14 days supply | Qty: 12 | Fill #0

## 2019-01-20 ENCOUNTER — Other Ambulatory Visit: Payer: Self-pay

## 2019-01-20 ENCOUNTER — Other Ambulatory Visit: Payer: Self-pay | Admitting: Internal Medicine

## 2019-01-20 ENCOUNTER — Ambulatory Visit (INDEPENDENT_AMBULATORY_CARE_PROVIDER_SITE_OTHER): Payer: Medicare Other | Admitting: Internal Medicine

## 2019-01-20 DIAGNOSIS — E1169 Type 2 diabetes mellitus with other specified complication: Secondary | ICD-10-CM

## 2019-01-20 DIAGNOSIS — Z87442 Personal history of urinary calculi: Secondary | ICD-10-CM | POA: Diagnosis not present

## 2019-01-20 DIAGNOSIS — I10 Essential (primary) hypertension: Secondary | ICD-10-CM

## 2019-01-20 DIAGNOSIS — E669 Obesity, unspecified: Secondary | ICD-10-CM

## 2019-01-20 DIAGNOSIS — D72829 Elevated white blood cell count, unspecified: Secondary | ICD-10-CM

## 2019-01-20 DIAGNOSIS — G4700 Insomnia, unspecified: Secondary | ICD-10-CM

## 2019-01-20 MED ORDER — LOSARTAN POTASSIUM 25 MG PO TABS: 25.0000 mg | ORAL_TABLET | Freq: Every day | ORAL | 3 refills | Status: AC

## 2019-01-20 MED ORDER — TRAZODONE HCL 100 MG PO TABS: 100.0000 mg | ORAL_TABLET | Freq: Every day | ORAL | 3 refills | Status: AC

## 2019-01-20 MED ORDER — LOSARTAN POTASSIUM 25 MG PO TABS: 12.5000 mg | ORAL_TABLET | Freq: Every day | ORAL | 3 refills | Status: DC

## 2019-01-20 NOTE — Progress Notes (Signed)
Pre visit review using our clinic review tool, if applicable. No additional management support is needed unless otherwise documented below in the visit note. 
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
## 2019-01-24 ENCOUNTER — Telehealth: Payer: Self-pay

## 2019-01-24 DIAGNOSIS — Z794 Long term (current) use of insulin: Secondary | ICD-10-CM | POA: Diagnosis not present

## 2019-01-24 DIAGNOSIS — E559 Vitamin D deficiency, unspecified: Secondary | ICD-10-CM | POA: Diagnosis not present

## 2019-01-24 DIAGNOSIS — E1169 Type 2 diabetes mellitus with other specified complication: Secondary | ICD-10-CM | POA: Diagnosis not present

## 2019-01-24 DIAGNOSIS — E1165 Type 2 diabetes mellitus with hyperglycemia: Secondary | ICD-10-CM | POA: Diagnosis not present

## 2019-01-24 NOTE — Telephone Encounter (Signed)
Why was fax received after lunch today when I asked it be faxed last week?  Are they able to add all the labs I requested?   [place]

## 2019-01-24 NOTE — Telephone Encounter (Signed)
Copied from [place]. Topic: General - Inquiry >> Jan 24, 2019  3:36 PM Richardo Priest, [place] wrote: Reason for CRM: Myer Peer, from [facility] with [facility], called in stating that they could not collect the urine from patient today. States they will add the labs to the A1C order that they received, apologized because fax was received after lunch and patient had left by then.

## 2019-01-25 NOTE — Telephone Encounter (Signed)
No this was def sent lastweek. We did have issues with it going through.

## 2019-01-26 ENCOUNTER — Encounter: Payer: Self-pay | Admitting: Internal Medicine

## 2019-01-26 ENCOUNTER — Ambulatory Visit (INDEPENDENT_AMBULATORY_CARE_PROVIDER_SITE_OTHER): Payer: Medicare Other

## 2019-01-26 ENCOUNTER — Telehealth: Payer: Self-pay | Admitting: Internal Medicine

## 2019-01-26 ENCOUNTER — Ambulatory Visit
Admission: EM | Admit: 2019-01-26 | Discharge: 2019-01-26 | Disposition: A | Discharge status: Home / Self Care | Payer: Medicare Other | Attending: Physician Assistant | Admitting: Physician Assistant

## 2019-01-26 DIAGNOSIS — R0782 Intercostal pain: Secondary | ICD-10-CM | POA: Diagnosis not present

## 2019-01-26 DIAGNOSIS — R0781 Pleurodynia: Secondary | ICD-10-CM

## 2019-01-26 MED ORDER — DICLOFENAC SODIUM 1 % TD GEL: 2.0000 g | Freq: Four times a day (QID) | TRANSDERMAL | 1 refills | Status: AC

## 2019-01-26 MED ORDER — KETOROLAC TROMETHAMINE 30 MG/ML IJ SOLN
30.0000 mg | Freq: Once | INTRAMUSCULAR | Status: AC
  Administered 2019-01-26: 30 mg via INTRAMUSCULAR

## 2019-01-26 MED ORDER — TIZANIDINE HCL 2 MG PO TABS: 2.0000 mg | ORAL_TABLET | Freq: Three times a day (TID) | ORAL | 0 refills | Status: AC | PRN

## 2019-01-26 NOTE — Telephone Encounter (Signed)
Patient called to advise of the note below from Dr. Terese Door, he says he already went to the UC and they took care of the x-ray.

## 2019-01-26 NOTE — Discharge Instructions (Addendum)
Xray negative for new fractures to the ribs. As discussed, if able to take ibuprofen, please take ibuprofen 600mg  three times a day for the pain. If PCP states no ibuprofen, you can use voltaren gel. Tizanidine as needed, this can make you drowsy, so do not take if you are going to drive, operate heavy machinery, or make important decisions. Ice compresses daily. This can take up to 3-4 weeks to completely resolve, but you should be feeling better each week. Follow up with PCP if symptoms worsen, changes for reevaluation. If experiencing sudden chest pain, shortness of breath, go to the ED for further evaluation.  If you experience sudden worse headache of your life, blurry vision, sensitivity to sunlight, vomiting, weakness, dizziness, passing out, go to the ED for further evaluation.
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
## 2019-01-26 NOTE — Telephone Encounter (Signed)
They Xrayed right ribs and old rib fractures no new ones   [name]
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
## 2019-01-26 NOTE — Telephone Encounter (Signed)
Left message for patient to return call back. [facility] may give results and obtain information.  
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
## 2019-01-27 NOTE — Telephone Encounter (Signed)
Patient was informed of results.  Patient understood and no questions, comments, or concerns at this time.  

## 2019-01-28 DIAGNOSIS — Z794 Long term (current) use of insulin: Secondary | ICD-10-CM | POA: Diagnosis not present

## 2019-01-28 DIAGNOSIS — E1165 Type 2 diabetes mellitus with hyperglycemia: Secondary | ICD-10-CM | POA: Diagnosis not present

## 2019-02-08 ENCOUNTER — Other Ambulatory Visit: Payer: Self-pay

## 2019-02-08 ENCOUNTER — Ambulatory Visit: Payer: Medicare Other | Attending: Anesthesiology | Admitting: Anesthesiology

## 2019-02-08 ENCOUNTER — Encounter: Payer: Self-pay | Admitting: Anesthesiology

## 2019-02-08 DIAGNOSIS — M542 Cervicalgia: Secondary | ICD-10-CM

## 2019-02-08 DIAGNOSIS — M25512 Pain in left shoulder: Secondary | ICD-10-CM

## 2019-02-08 DIAGNOSIS — G894 Chronic pain syndrome: Secondary | ICD-10-CM

## 2019-02-08 DIAGNOSIS — M25511 Pain in right shoulder: Secondary | ICD-10-CM

## 2019-02-08 DIAGNOSIS — M5134 Other intervertebral disc degeneration, thoracic region: Secondary | ICD-10-CM

## 2019-02-08 DIAGNOSIS — R531 Weakness: Secondary | ICD-10-CM | POA: Diagnosis not present

## 2019-02-08 DIAGNOSIS — M5136 Other intervertebral disc degeneration, lumbar region: Secondary | ICD-10-CM

## 2019-02-08 DIAGNOSIS — F1190 Opioid use, unspecified, uncomplicated: Secondary | ICD-10-CM

## 2019-02-08 DIAGNOSIS — M961 Postlaminectomy syndrome, not elsewhere classified: Secondary | ICD-10-CM | POA: Diagnosis not present

## 2019-02-08 MED ORDER — PREGABALIN 100 MG PO CAPS: 100.0000 mg | ORAL_CAPSULE | Freq: Three times a day (TID) | ORAL | 2 refills | Status: DC

## 2019-02-08 MED ORDER — OXYCODONE HCL 10 MG PO TABS: 10.0000 mg | ORAL_TABLET | Freq: Four times a day (QID) | ORAL | 0 refills | Status: DC

## 2019-02-08 NOTE — Progress Notes (Signed)
Virtual Visit via Telephone Note  I connected with [name] on 02/08/19 at  9:30 AM EDT by telephone and verified that I am speaking with the correct person using two identifiers.  Location: Patient: Home Provider: Pain control center   I discussed the limitations, risks, security and privacy concerns of performing an evaluation and management service by telephone and the availability of in person appointments. I also discussed with the patient that there may be a patient responsible charge related to this service. The patient expressed understanding and agreed to proceed.   History of Present Illness: I told her conversation with [name]  today.  He reports that he is doing well with his medications and that the quality of low back pain is been stable in nature no significant changes are reported.  He is taking his medications as prescribed and these are giving him good relief of his symptoms.  No side effects are noted.  The quality characteristic and distribution of the pain have remained stable.  He is also taking his Lyrica at bedtime and this is working well for him.  Otherwise he is in his usual state of health.    Observations/Objective:  Current Outpatient Medications:  .  acetaminophen (TYLENOL) 500 MG tablet, Take 500-1,000 mg by mouth every 6 (six) hours as needed for headache. , Disp: , Rfl:  .  albuterol (PROVENTIL HFA;VENTOLIN HFA) 108 (90 Base) MCG/ACT inhaler, Inhale 1-2 puffs into the lungs every 6 (six) hours as needed for wheezing or shortness of breath., Disp: , Rfl:  .  amoxicillin-clavulanate (AUGMENTIN) 875-125 MG tablet, Take 1 tablet by mouth 2 (two) times daily. One po bid x 7 days, Disp: 14 tablet, Rfl: 0 .  atorvastatin (LIPITOR) 10 MG tablet, Take 1 tablet (10 mg total) by mouth daily at 6 PM., Disp: 90 tablet, Rfl: 3 .  Continuous Blood Gluc Sensor (FREESTYLE LIBRE 14 DAY SENSOR) MISC, U Q 14 DAYS UTD, Disp: , Rfl: 5 .  diclofenac sodium (VOLTAREN) 1  % GEL, Apply 2 g topically 4 (four) times daily., Disp: 1 Tube, Rfl: 1 .  diphenhydrAMINE (BENADRYL) 25 mg capsule, Take 25 mg by mouth 3 (three) times daily as needed for itching. , Disp: , Rfl:  .  docusate calcium (V-R DOCUSATE CALCIUM) 240 MG capsule, Take 240 mg by mouth 2 (two) times daily as needed for mild constipation., Disp: , Rfl:  .  furosemide (LASIX) 40 MG tablet, Take 1 tablet (40 mg total) by mouth daily., Disp: 90 tablet, Rfl: 3 .  glucose blood (FREESTYLE LITE) test strip, Use as instructed, Disp: 400 each, Rfl: 3 .  glucose blood test strip, Check qid Use as instructed, Disp: 400 each, Rfl: 12 .  HUMULIN R U-500 KWIKPEN 500 UNIT/ML kwikpen, Inject 0-125 Units into the skin 4 (four) times daily -  with meals and at bedtime. Sliding scale: (u500 insulin) Breakfast: 70-90: 51u, 91-130: 85u, 131-150: 90u, 151-200: 95u, 201-250: 100u, 251-300: 105u, 301-350: 110u, 351-400: 115u, 401-450: 120u, >450: 125u Lunch:  70-90: 45u, 91-130: 75u, 131-150: 80u, 151-200: 85u, 201-250: 90u, 251-300: 95u, 301-350: 100u, 351-400: 105u, 401-450: 110u, >450: 115u Supper: 70-90: 42u, 91-130: 70u, 131-150: 75u, 151-200: 80u, 201-250: 85u, 251-300: 90u, 301-350: 95u, 351-400: 100u, 401-450: 105u, >450: 110u Nighttime:  70-90: 0u, 91-130: 0u, 131-150: 0u, 151-200: 0u, 201-250: 0u, 251-300: 1u, 301-350: 2u, 351-400: 3u, 401-450: 4u, >450: 5u, Disp: , Rfl: 11 .  Lancets (FREESTYLE) lancets, Qid check glucoseUse as instructed, Disp:  400 each, Rfl: 3 .  levocetirizine (XYZAL) 5 MG tablet, Take 5 mg by mouth daily as needed for allergies. , Disp: , Rfl:  .  linaclotide (LINZESS) 72 MCG capsule, Take 1 capsule (72 mcg total) by mouth daily before breakfast. (Patient taking differently: Take 72 mcg by mouth daily as needed (constipation.). ), Disp: 90 capsule, Rfl: 3 .  LORazepam (ATIVAN) 0.5 MG tablet, Take 0.5 mg by mouth 3 (three) times daily as needed for anxiety., Disp: , Rfl:  .  losartan (COZAAR) 25 MG tablet,  Take 1 tablet (25 mg total) by mouth daily., Disp: 90 tablet, Rfl: 3 .  mupirocin ointment (BACTROBAN) 2 %, Apply 1 application topically 2 (two) times daily. (Patient taking differently: Apply 1 application topically 2 (two) times daily as needed (sores). ), Disp: 30 g, Rfl: 0 .  naloxone (NARCAN) nasal spray 4 mg/0.1 mL, As directed for opioid induced respiratory depression, Disp: 1 kit, Rfl: 1 .  nystatin (MYCOSTATIN) 100000 UNIT/ML suspension, Take 5 mLs by mouth 4 (four) times daily as needed. For mouth sores., Disp: , Rfl:  .  nystatin (MYCOSTATIN/NYSTOP) powder, Apply 1 application topically 2 (two) times daily as needed. For yeast around skin folds, Disp: , Rfl:  .  ondansetron (ZOFRAN-ODT) 4 MG disintegrating tablet, Take 1 tablet (4 mg total) by mouth 2 (two) times daily as needed for nausea or vomiting., Disp: 60 tablet, Rfl: 1 .  OVER THE COUNTER MEDICATION, Take 1 packet by mouth daily. Crystal Light Lemonade Daily, Disp: , Rfl:  .  [START ON 02/12/2019] Oxycodone HCl 10 MG TABS, Take 1 tablet (10 mg total) by mouth 4 (four) times daily for 30 days., Disp: 120 tablet, Rfl: 0 .  [START ON 03/14/2019] Oxycodone HCl 10 MG TABS, Take 1 tablet (10 mg total) by mouth 4 (four) times daily for 30 days., Disp: 120 tablet, Rfl: 0 .  pantoprazole (PROTONIX) 40 MG tablet, Take 40 mg by mouth 2 (two) times daily. , Disp: , Rfl: 1 .  pregabalin (LYRICA) 100 MG capsule, Take 1 capsule (100 mg total) by mouth 3 (three) times daily for 30 days., Disp: 90 capsule, Rfl: 2 .  promethazine (PHENERGAN) 25 MG tablet, Take 1 tablet (25 mg total) by mouth every 8 (eight) hours as needed for nausea or vomiting., Disp: 90 tablet, Rfl: 1 .  tamsulosin (FLOMAX) 0.4 MG CAPS capsule, Take 1 capsule (0.4 mg total) by mouth at bedtime., Disp: 30 capsule, Rfl: 0 .  tiZANidine (ZANAFLEX) 2 MG tablet, Take 1 tablet (2 mg total) by mouth every 8 (eight) hours as needed for muscle spasms., Disp: 15 tablet, Rfl: 0 .  traZODone  (DESYREL) 100 MG tablet, Take 1 tablet (100 mg total) by mouth at bedtime., Disp: 90 tablet, Rfl: 3 .  triamcinolone cream (KENALOG) 0.1 %, Apply 1 application topically 2 (two) times daily as needed (rash/itching)., Disp: , Rfl:    Assessment and Plan: 1. DDD (degenerative disc disease), thoracic   2. Right sided weakness   3. Failed back surgical syndrome   4. Chronic pain syndrome   5. Chronic, continuous use of opioids   6. Neck pain   7. Pain of both shoulder joints   8. DDD (degenerative disc disease), lumbar   Based on our discussion today and after review of the [facility] practitioner database, I will refill his medications for the opioids dated 5/30 and 629 and also refill his Lyrica from 100 mg at bedtime with 2 refills.  I am scheduling him for return to clinic in 2 months and I encouraged him to continue follow-up with his primary care physicians for his baseline medical care.  He is instructed to contact us of the pain control center should he have any problems in the meantime.   Follow Up Instructions:    I discussed the assessment and treatment plan with the patient. The patient was provided an opportunity to ask questions and all were answered. The patient agreed with the plan and demonstrated an understanding of the instructions.   The patient was advised to call back or seek an in-person evaluation if the symptoms worsen or if the condition fails to improve as anticipated.  I provided 20 minutes of non-face-to-face time during this encounter.   Molli Barrows, MD

## 2019-02-24 MED ORDER — OXYCODONE HCL 10 MG PO TABS: 10.0000 mg | ORAL_TABLET | Freq: Four times a day (QID) | ORAL | 0 refills | Status: AC

## 2019-02-24 MED FILL — HUMULIN R 500 UNITS/ML KWIK: 500 | 7 days supply | Qty: 6 | Fill #0

## 2019-02-24 NOTE — Addendum Note (Signed)
Addended by: Molli Barrows on: 02/24/2019 05:46 PM   Modules accepted: Orders

## 2019-02-28 DIAGNOSIS — E1165 Type 2 diabetes mellitus with hyperglycemia: Secondary | ICD-10-CM | POA: Diagnosis not present

## 2019-02-28 DIAGNOSIS — Z794 Long term (current) use of insulin: Secondary | ICD-10-CM | POA: Diagnosis not present

## 2019-02-28 MED FILL — FUROSEMIDE 40 MG TAB: 40 | 90 days supply | Qty: 90 | Fill #3

## 2019-03-16 MED FILL — HUMULIN R 500 UNITS/ML KWIK: 500 | 7 days supply | Qty: 6 | Fill #1

## 2019-03-18 MED FILL — LOSARTAN POTASSIUM 25 MG TA: 25 | 90 days supply | Qty: 45 | Fill #1

## 2019-03-21 ENCOUNTER — Other Ambulatory Visit: Payer: Self-pay | Admitting: Cardiovascular Disease

## 2019-03-21 DIAGNOSIS — R609 Edema, unspecified: Secondary | ICD-10-CM

## 2019-03-23 ENCOUNTER — Telehealth: Payer: Self-pay | Admitting: *Deleted

## 2019-03-29 MED FILL — HUMULIN R 500 UNITS/ML KWIK: 500 | 7 days supply | Qty: 6 | Fill #2

## 2019-04-07 ENCOUNTER — Other Ambulatory Visit: Payer: Self-pay

## 2019-04-07 ENCOUNTER — Encounter: Payer: Self-pay | Admitting: Anesthesiology

## 2019-04-07 ENCOUNTER — Ambulatory Visit: Payer: Medicare Other | Attending: Anesthesiology | Admitting: Anesthesiology

## 2019-04-07 DIAGNOSIS — M542 Cervicalgia: Secondary | ICD-10-CM

## 2019-04-07 DIAGNOSIS — R531 Weakness: Secondary | ICD-10-CM

## 2019-04-07 DIAGNOSIS — G894 Chronic pain syndrome: Secondary | ICD-10-CM

## 2019-04-07 DIAGNOSIS — M961 Postlaminectomy syndrome, not elsewhere classified: Secondary | ICD-10-CM | POA: Diagnosis not present

## 2019-04-07 DIAGNOSIS — M5134 Other intervertebral disc degeneration, thoracic region: Secondary | ICD-10-CM

## 2019-04-07 DIAGNOSIS — F1190 Opioid use, unspecified, uncomplicated: Secondary | ICD-10-CM

## 2019-04-07 DIAGNOSIS — M25511 Pain in right shoulder: Secondary | ICD-10-CM

## 2019-04-07 DIAGNOSIS — M25512 Pain in left shoulder: Secondary | ICD-10-CM

## 2019-04-07 DIAGNOSIS — M5136 Other intervertebral disc degeneration, lumbar region: Secondary | ICD-10-CM

## 2019-04-07 MED ORDER — OXYCODONE HCL 10 MG PO TABS: 10.0000 mg | ORAL_TABLET | Freq: Four times a day (QID) | ORAL | 0 refills | Status: AC

## 2019-04-07 MED ORDER — PREGABALIN 100 MG PO CAPS: 100.0000 mg | ORAL_CAPSULE | Freq: Three times a day (TID) | ORAL | 2 refills | Status: DC

## 2019-04-07 NOTE — Progress Notes (Signed)
Virtual Visit via Video Note  I connected with [name] on 04/07/19 at  2:15 PM EDT by a video enabled telemedicine application and verified that I am speaking with the correct person using two identifiers.  Location: Patient: Home Provider: Pain control center   I discussed the limitations of evaluation and management by telemedicine and the availability of in person appointments. The patient expressed understanding and agreed to proceed.  History of Present Illness: I spoke with [name] today via video voice conferencing.  He seems to be doing well with his low back pain and diffuse body pain.  He is taking his medications as prescribed and based on our discussion today he continues to derive good functional lifestyle improvement with no side effects reported.  Otherwise he is in his usual state of health.  He is also taking his Lyrica as prescribed and this continues to give him good relief as well.  No other changes are reported to upper or lower extremity strength.  The quality characteristic and distribution of the pain is been stable.    Observations/Objective: Current Outpatient Medications:  .  acetaminophen (TYLENOL) 500 MG tablet, Take 500-1,000 mg by mouth every 6 (six) hours as needed for headache. , Disp: , Rfl:  .  albuterol (PROVENTIL HFA;VENTOLIN HFA) 108 (90 Base) MCG/ACT inhaler, Inhale 1-2 puffs into the lungs every 6 (six) hours as needed for wheezing or shortness of breath., Disp: , Rfl:  .  amoxicillin-clavulanate (AUGMENTIN) 875-125 MG tablet, Take 1 tablet by mouth 2 (two) times daily. One po bid x 7 days, Disp: 14 tablet, Rfl: 0 .  atorvastatin (LIPITOR) 10 MG tablet, Take 1 tablet (10 mg total) by mouth daily at 6 PM., Disp: 90 tablet, Rfl: 3 .  Continuous Blood Gluc Sensor (FREESTYLE LIBRE 14 DAY SENSOR) MISC, U Q 14 DAYS UTD, Disp: , Rfl: 5 .  diclofenac sodium (VOLTAREN) 1 % GEL, Apply 2 g topically 4 (four) times daily., Disp: 1 Tube, Rfl: 1 .   diphenhydrAMINE (BENADRYL) 25 mg capsule, Take 25 mg by mouth 3 (three) times daily as needed for itching. , Disp: , Rfl:  .  docusate calcium (V-R DOCUSATE CALCIUM) 240 MG capsule, Take 240 mg by mouth 2 (two) times daily as needed for mild constipation., Disp: , Rfl:  .  furosemide (LASIX) 40 MG tablet, Take 1 tablet (40 mg total) by mouth daily., Disp: 90 tablet, Rfl: 3 .  glucose blood (FREESTYLE LITE) test strip, Use as instructed, Disp: 400 each, Rfl: 3 .  glucose blood test strip, Check qid Use as instructed, Disp: 400 each, Rfl: 12 .  HUMULIN R U-500 KWIKPEN 500 UNIT/ML kwikpen, Inject 0-125 Units into the skin 4 (four) times daily -  with meals and at bedtime. Sliding scale: (u500 insulin) Breakfast: 70-90: 51u, 91-130: 85u, 131-150: 90u, 151-200: 95u, 201-250: 100u, 251-300: 105u, 301-350: 110u, 351-400: 115u, 401-450: 120u, >450: 125u Lunch:  70-90: 45u, 91-130: 75u, 131-150: 80u, 151-200: 85u, 201-250: 90u, 251-300: 95u, 301-350: 100u, 351-400: 105u, 401-450: 110u, >450: 115u Supper: 70-90: 42u, 91-130: 70u, 131-150: 75u, 151-200: 80u, 201-250: 85u, 251-300: 90u, 301-350: 95u, 351-400: 100u, 401-450: 105u, >450: 110u Nighttime:  70-90: 0u, 91-130: 0u, 131-150: 0u, 151-200: 0u, 201-250: 0u, 251-300: 1u, 301-350: 2u, 351-400: 3u, 401-450: 4u, >450: 5u, Disp: , Rfl: 11 .  Lancets (FREESTYLE) lancets, Qid check glucoseUse as instructed, Disp: 400 each, Rfl: 3 .  levocetirizine (XYZAL) 5 MG tablet, Take 5 mg by mouth daily as  needed for allergies. , Disp: , Rfl:  .  linaclotide (LINZESS) 72 MCG capsule, Take 1 capsule (72 mcg total) by mouth daily before breakfast. (Patient taking differently: Take 72 mcg by mouth daily as needed (constipation.). ), Disp: 90 capsule, Rfl: 3 .  LORazepam (ATIVAN) 0.5 MG tablet, Take 0.5 mg by mouth 3 (three) times daily as needed for anxiety., Disp: , Rfl:  .  losartan (COZAAR) 25 MG tablet, Take 1 tablet (25 mg total) by mouth daily., Disp: 90 tablet, Rfl: 3 .   mupirocin ointment (BACTROBAN) 2 %, Apply 1 application topically 2 (two) times daily. (Patient taking differently: Apply 1 application topically 2 (two) times daily as needed (sores). ), Disp: 30 g, Rfl: 0 .  naloxone (NARCAN) nasal spray 4 mg/0.1 mL, As directed for opioid induced respiratory depression, Disp: 1 kit, Rfl: 1 .  nystatin (MYCOSTATIN) 100000 UNIT/ML suspension, Take 5 mLs by mouth 4 (four) times daily as needed. For mouth sores., Disp: , Rfl:  .  nystatin (MYCOSTATIN/NYSTOP) powder, Apply 1 application topically 2 (two) times daily as needed. For yeast around skin folds, Disp: , Rfl:  .  ondansetron (ZOFRAN-ODT) 4 MG disintegrating tablet, Take 1 tablet (4 mg total) by mouth 2 (two) times daily as needed for nausea or vomiting., Disp: 60 tablet, Rfl: 1 .  OVER THE COUNTER MEDICATION, Take 1 packet by mouth daily. Crystal Light Lemonade Daily, Disp: , Rfl:  .  [START ON 04/21/2019] Oxycodone HCl 10 MG TABS, Take 1 tablet (10 mg total) by mouth 4 (four) times daily., Disp: 120 tablet, Rfl: 0 .  [START ON 05/20/2019] Oxycodone HCl 10 MG TABS, Take 1 tablet (10 mg total) by mouth 4 (four) times daily., Disp: 120 tablet, Rfl: 0 .  pantoprazole (PROTONIX) 40 MG tablet, Take 40 mg by mouth 2 (two) times daily. , Disp: , Rfl: 1 .  [START ON 04/20/2019] pregabalin (LYRICA) 100 MG capsule, Take 1 capsule (100 mg total) by mouth 3 (three) times daily., Disp: 90 capsule, Rfl: 2 .  promethazine (PHENERGAN) 25 MG tablet, Take 1 tablet (25 mg total) by mouth every 8 (eight) hours as needed for nausea or vomiting., Disp: 90 tablet, Rfl: 1 .  tamsulosin (FLOMAX) 0.4 MG CAPS capsule, Take 1 capsule (0.4 mg total) by mouth at bedtime., Disp: 30 capsule, Rfl: 0 .  tiZANidine (ZANAFLEX) 2 MG tablet, Take 1 tablet (2 mg total) by mouth every 8 (eight) hours as needed for muscle spasms., Disp: 15 tablet, Rfl: 0 .  traZODone (DESYREL) 100 MG tablet, Take 1 tablet (100 mg total) by mouth at bedtime., Disp: 90 tablet,  Rfl: 3 .  triamcinolone cream (KENALOG) 0.1 %, Apply 1 application topically 2 (two) times daily as needed (rash/itching)., Disp: , Rfl:    Assessment and Plan: 1. Chronic pain syndrome    1. DDD (degenerative disc disease), thoracic   2. Chronic pain syndrome   3. Right sided weakness   4. Failed back surgical syndrome   5. Chronic, continuous use of opioids   6. Neck pain   7. Pain of both shoulder joints   8. DDD (degenerative disc disease), lumbar   Based on our discussion today and upon review of the [facility] practitioner database information I am going to refill his medication.  Refills will be dated for August 5 and September 4.  We will have him reschedule for a 73-monthreturn to clinic.  He is instructed to contact the pain control center if he  should have any difficulties with his pain management in the meantime and continue follow-up with his primary care physician for his baseline medical care. Follow Up Instructions:    I discussed the assessment and treatment plan with the patient. The patient was provided an opportunity to ask questions and all were answered. The patient agreed with the plan and demonstrated an understanding of the instructions.   The patient was advised to call back or seek an in-person evaluation if the symptoms worsen or if the condition fails to improve as anticipated.  I provided 30 minutes of non-face-to-face time during this encounter.   Molli Barrows, MD

## 2019-04-11 MED FILL — HUMULIN R 500 UNITS/ML KWIK: 500 | 14 days supply | Qty: 12 | Fill #3

## 2019-04-21 ENCOUNTER — Ambulatory Visit (INDEPENDENT_AMBULATORY_CARE_PROVIDER_SITE_OTHER): Payer: Medicare Other

## 2019-04-21 ENCOUNTER — Other Ambulatory Visit: Payer: Self-pay

## 2019-04-21 DIAGNOSIS — Z0000 Encounter for general adult medical examination without abnormal findings: Secondary | ICD-10-CM | POA: Diagnosis not present

## 2019-04-21 NOTE — Patient Instructions (Addendum)
  Mr. [name] , Thank you for taking time to come for your Medicare Wellness Visit. I appreciate your ongoing commitment to your health goals. Please review the following plan we discussed and let me know if I can assist you in the future.   These are the goals we discussed: Goals    . Follow up with Primary Care Provider     As needed       This is a list of the screening recommended for you and due dates:  Health Maintenance  Topic Date Due  . Complete foot exam   05/30/2016  . Flu Shot  04/16/2019  . Hemoglobin A1C  07/18/2019  . Colon Cancer Screening  09/16/2019  . Eye exam for diabetics  03/21/2020  . Tetanus Vaccine  07/21/2022  .  Hepatitis C: One time screening is recommended by Center for Disease Control  (CDC) for  adults born from [birth year] through 1963.   Completed  . Pneumonia vaccines  Completed
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
## 2019-04-29 MED FILL — PANTOPRAZOLE SOD DR 40 MG T: 40 | 30 days supply | Qty: 60 | Fill #0

## 2019-05-02 DIAGNOSIS — Z794 Long term (current) use of insulin: Secondary | ICD-10-CM | POA: Diagnosis not present

## 2019-05-02 DIAGNOSIS — E1169 Type 2 diabetes mellitus with other specified complication: Secondary | ICD-10-CM | POA: Diagnosis not present

## 2019-05-02 DIAGNOSIS — E1165 Type 2 diabetes mellitus with hyperglycemia: Secondary | ICD-10-CM | POA: Diagnosis not present

## 2019-05-02 DIAGNOSIS — E1159 Type 2 diabetes mellitus with other circulatory complications: Secondary | ICD-10-CM | POA: Diagnosis not present

## 2019-06-01 ENCOUNTER — Other Ambulatory Visit: Payer: Self-pay

## 2019-06-01 ENCOUNTER — Encounter: Payer: Self-pay | Admitting: Internal Medicine

## 2019-06-01 DIAGNOSIS — R609 Edema, unspecified: Secondary | ICD-10-CM

## 2019-06-01 MED ORDER — PANTOPRAZOLE SODIUM 40 MG PO TBEC: 40.0000 mg | DELAYED_RELEASE_TABLET | Freq: Two times a day (BID) | ORAL | 1 refills | Status: AC

## 2019-06-01 MED ORDER — FUROSEMIDE 40 MG PO TABS: 40.0000 mg | ORAL_TABLET | Freq: Every day | ORAL | 3 refills | Status: AC

## 2019-06-06 ENCOUNTER — Ambulatory Visit: Payer: Medicare Other | Attending: Anesthesiology | Admitting: Anesthesiology

## 2019-06-06 ENCOUNTER — Other Ambulatory Visit: Payer: Self-pay

## 2019-06-06 ENCOUNTER — Encounter: Payer: Self-pay | Admitting: Anesthesiology

## 2019-06-06 DIAGNOSIS — M5134 Other intervertebral disc degeneration, thoracic region: Secondary | ICD-10-CM

## 2019-06-06 DIAGNOSIS — M961 Postlaminectomy syndrome, not elsewhere classified: Secondary | ICD-10-CM

## 2019-06-06 DIAGNOSIS — R531 Weakness: Secondary | ICD-10-CM | POA: Diagnosis not present

## 2019-06-06 DIAGNOSIS — F1190 Opioid use, unspecified, uncomplicated: Secondary | ICD-10-CM

## 2019-06-06 DIAGNOSIS — G894 Chronic pain syndrome: Secondary | ICD-10-CM | POA: Diagnosis not present

## 2019-06-06 DIAGNOSIS — M25512 Pain in left shoulder: Secondary | ICD-10-CM

## 2019-06-06 DIAGNOSIS — M25511 Pain in right shoulder: Secondary | ICD-10-CM

## 2019-06-06 DIAGNOSIS — M542 Cervicalgia: Secondary | ICD-10-CM

## 2019-06-06 MED ORDER — PREGABALIN 100 MG PO CAPS: 100.0000 mg | ORAL_CAPSULE | Freq: Three times a day (TID) | ORAL | 2 refills | Status: AC

## 2019-06-06 MED ORDER — OXYCODONE HCL 10 MG PO TABS: 10.0000 mg | ORAL_TABLET | Freq: Four times a day (QID) | ORAL | 0 refills | Status: DC

## 2019-06-06 MED ORDER — OXYCODONE HCL 10 MG PO TABS: 10.0000 mg | ORAL_TABLET | Freq: Four times a day (QID) | ORAL | 0 refills | Status: AC

## 2019-06-07 ENCOUNTER — Ambulatory Visit (INDEPENDENT_AMBULATORY_CARE_PROVIDER_SITE_OTHER): Payer: Medicare Other | Admitting: Family Medicine

## 2019-06-07 ENCOUNTER — Other Ambulatory Visit: Payer: Self-pay

## 2019-06-07 VITALS — BP 156/62 | HR 78 | Temp 97.8°F | Resp 20 | Ht 73.0 in | Wt 275.0 lb

## 2019-06-07 DIAGNOSIS — Z23 Encounter for immunization: Secondary | ICD-10-CM

## 2019-06-07 DIAGNOSIS — L89326 Pressure-induced deep tissue damage of left buttock: Secondary | ICD-10-CM

## 2019-06-07 MED ORDER — METHYLPREDNISOLONE 4 MG PO TBPK: ORAL_TABLET | ORAL | 0 refills | Status: DC

## 2019-06-07 MED ORDER — LIDOCAINE 5 % EX OINT: 1.0000 "application " | TOPICAL_OINTMENT | CUTANEOUS | 1 refills | Status: AC | PRN

## 2019-06-07 NOTE — Progress Notes (Signed)
Subjective:    Patient ID: [name], male    DOB: Aug 05, 1951, 68 y.o.   MRN: [id number]  HPI   Patient presents to clinic due to a bedsore on left buttock cheek.  States this area has been opening up and healing off and on for greater part of 10 years.  Due to patient's medical issues, he is unable to lay on his stomach, usually sits in a recliner for most the day and also sleeps in the [place].  Tries to offset weight by using a doughnut pillow and also alternating what side he puts a pillow under his bottom to offset body weight.  Area currently is not open or draining.  Patient describes it is quite painful.  He is already on chronic oxycodone daily due to chronic pain/multiple medical issues. Did take a dose of Aleve with some pain relief, but has to avoid NSAIDs.  Patient Active Problem List   Diagnosis Date Noted  . History of kidney stones 01/20/2019  . Dysphagia 09/22/2018  . Tinea pedis of left foot 05/27/2018  . Neuropathy 05/27/2018  . Insomnia 05/21/2018  . Hyperlipidemia 05/21/2018  . Constipation 03/22/2018  . Diabetes mellitus ([place]) 03/22/2018  . Benign prostatic hyperplasia 03/22/2018  . IBS (irritable bowel syndrome) 03/16/2018  . Acute cystitis without hematuria   . ESBL (extended spectrum beta-lactamase) producing bacteria infection   . Acute cystitis with hematuria   . Chronic anemia 04/23/2016  . Complicated UTI (urinary tract infection) 04/23/2016  . Facet syndrome, lumbar 04/08/2016  . Hypokalemia 01/29/2016  . Pneumonia due to Pseudomonas ([place]) 12/26/2015  . Kidney stones   . Pyelonephritis 12/22/2015  . Sepsis ([place]) 12/22/2015  . HCAP (healthcare-associated pneumonia) 12/22/2015  . Renal calculi 12/20/2015  . Acute bronchitis 12/07/2015  . Acute conjunctivitis of both eyes 12/07/2015  . TIA (transient ischemic attack)   . Right sided weakness 12/05/2015  . Vision loss night 12/05/2015  . Nephrolithiasis 11/06/2015  . Phantom pain ([place])  08/30/2015  . Status post below knee amputation 08/30/2015  . DJD of shoulder 08/30/2015  . 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 DDD (degenerative disc disease), lumbar 08/30/2015  . Diabetes mellitus type 2 in obese ([place]) 09/20/2014  . Diabetic retinopathy ([place]) 12/30/2013  . Gastroesophageal reflux disease with hiatal hernia 09/23/2013  . Chronic pain syndrome 09/16/2013  . 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 Disturbance of skin sensation 03/25/2012  . COPD, severe ([place]) 01/26/2012  . CAD (coronary artery disease) 11/25/2011  . Encounter for long-term (current) use of other medications 03/03/2011  . 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 SHOULDER PAIN, BILATERAL 01/10/2010  . UNSPECIFIED PERIPHERAL VASCULAR DISEASE 11/12/2009  . ESOPHAGITIS 11/12/2009  . ANEMIA, IRON DEFICIENCY 10/11/2009  . Depression 05/17/2009  . HYPERCHOLESTEROLEMIA 09/18/2008  . Primary hyperparathyroidism ([place]) 01/21/2008  . DDD (degenerative disc disease), lumbosacral 01/21/2008  . Essential hypertension 03/29/2007   Social History   Tobacco Use  . Smoking status: Former Smoker    Packs/day: 2.00    Years: 6.00    Pack years: 12.00    Types: Cigarettes    Quit date: 09/15/1978    Years since quitting: 40.7  . Smokeless tobacco: Never Used  Substance Use Topics  . Alcohol use: No    Alcohol/week: 0.0 standard drinks   Review of Systems  Constitutional: Negative for chills, fatigue and fever.  HENT: Negative for congestion, ear pain, sinus pain and sore throat.   Eyes: Negative.  Respiratory: Negative for cough, shortness of breath and wheezing.   Cardiovascular: Negative for chest pain, palpitations and leg swelling.  Gastrointestinal: Negative for abdominal pain, diarrhea, nausea and vomiting.  Genitourinary: Negative for dysuria, frequency and urgency.  Musculoskeletal: Negative for arthralgias and myalgias.  Skin: +Bed sore on bottom  Neurological: Negative for syncope, light-headedness and headaches.  Psychiatric/Behavioral: The patient is not nervous/anxious.       Objective:   Physical Exam Vitals signs and nursing note reviewed.  Constitutional:      General: He is not in acute distress.    Appearance: He is obese. He is not ill-appearing, toxic-appearing or diaphoretic.  Eyes:     General: No scleral icterus.    Extraocular Movements: Extraocular movements intact.     Conjunctiva/sclera: Conjunctivae normal.     Pupils: Pupils are equal, round, and reactive to light.  Cardiovascular:     Rate and Rhythm: Normal rate and regular rhythm.     Heart sounds: Normal heart sounds.  Pulmonary:     Effort: Pulmonary effort is normal.     Breath sounds: Normal breath sounds.  Skin:    General: Skin is warm and dry.     Coloration: Skin is not jaundiced or pale.     Findings: No bruising.          Comments: Location of bedsore indicated by red mark on diagram.  Is approximately diameter of a quarter.  It is not open or draining, workplace is at a level of pressure injury.  Neurological:     Mental Status: He is alert and oriented to person, place, and time.     Comments: Right AKA with prosthetic, uses canes to walk  Psychiatric:        Mood and Affect: Mood normal.        Behavior: Behavior normal.    Today's Vitals   06/07/19 1516  BP: (!) 156/62  Pulse: 78  Resp: 20  Temp: 97.8 F (36.6 C)  TempSrc: Temporal  SpO2: 94%  Weight: 275 lb (124.7 kg)  Height: 6\' 1"  (1.854 m)   Body mass index is 36.28 kg/m.     Assessment & Plan:    1. Pressure injury of deep tissue of left buttock  Patient will use topical lidocaine cream to help soothe painful area.  He will continue his chronic oxycodone as prescribed by pain management.  We will do low-dose oral steroid taper to help reduce inflammation, patient stated he did respond to a dose of Aleve however avoids this drug class due to his multiple other  medical issues.  Patient will continue to use doughnut pillow and offload weight by alternating putting a pillow under the right or left side when sitting in chair.  Discussed that if area does open, we can refer to wound care.  Also recommended using a skin protectant such as a Proshield to help create an extra barrier on the skin.  - lidocaine (XYLOCAINE) 5 % ointment; Apply 1 application topically as needed.  Dispense: 35.44 g; Refill: 1 - methylPREDNISolone (MEDROL DOSEPAK) 4 MG TBPK tablet; Take according to pack instructions  Dispense: 21 tablet; Refill: 0  2. Need for immunization against influenza  - Flu Vaccine QUAD High Dose(Fluad)   Patient will keep all regular follow-up with PCP as planned.  He will return to clinic sooner if any issues arise.

## 2019-06-07 NOTE — Patient Instructions (Signed)
Pressure Injury  A pressure injury is damage to the skin and underlying tissue that results from pressure being applied to an area of the body. It often affects people who must spend a long time in a bed or chair because of a medical condition. Pressure injuries usually occur:  Over bony parts of the body, such as the tailbone, shoulders, elbows, hips, heels, spine, ankles, and back of the head.  Under medical devices that make contact with the body, such as respiratory equipment, stockings, tubes, and splints. Pressure injuries start as reddened areas on the skin and can lead to pain and an open wound. What are the causes? This condition is caused by frequent or constant pressure to an area of the body. Decreased blood flow to the skin can eventually cause the skin tissue to die and break down, causing a wound. What increases the risk? You are more likely to develop this condition if you:  Are in the hospital or an extended care facility.  Are bedridden or in a wheelchair.  Have an injury or disease that keeps you from: ? Moving normally. ? Feeling pain or pressure.  Have a condition that: ? Makes you sleepy or less alert. ? Causes poor blood flow.  Need to wear a medical device.  Have poor control of your bladder or bowel functions (incontinence).  Have poor nutrition (malnutrition). If you are at risk for pressure injuries, your health care provider may recommend certain types of mattresses, mattress covers, pillows, cushions, or boots to help prevent them. These may include products filled with air, foam, gel, or sand. What are the signs or symptoms? Symptoms of this condition depend on the severity of the injury. Symptoms may include:  Red or dark areas of the skin.  Pain, warmth, or a change of skin texture.  Blisters.  An open wound. How is this diagnosed? This condition is diagnosed with a medical history and physical exam. You may also have tests, such as:  Blood  tests.  Imaging tests.  Blood flow tests. Your pressure injury will be staged based on its severity. Staging is based on:  The depth of the tissue injury, including whether there is exposure of muscle, bone, or tendon.  The cause of the pressure injury. How is this treated? This condition may be treated by:  Relieving or redistributing pressure on your skin. This includes: ? Frequently changing your position. ? Avoiding positions that caused the wound or that can make the wound worse. ? Using specific bed mattresses, chair cushions, or protective boots. ? Moving medical devices from an area of pressure, or placing padding between the skin and the device. ? Using foams, creams, or powders to prevent rubbing (friction) on the skin.  Keeping your skin clean and dry. This may include using a skin cleanser or skin barrier as told by your health care provider.  Cleaning your injury and removing any dead tissue from the wound (debridement).  Placing a bandage (dressing) over your injury.  Using medicines for pain or to prevent or treat infection. Surgery may be needed if other treatments are not working or if your injury is very deep. Follow these instructions at home: Wound care  Follow instructions from your health care provider about how to take care of your wound. Make sure you: ? Wash your hands with soap and water before and after you change your bandage (dressing). If soap and water are not available, use hand sanitizer. ? Change your dressing as told   by your health care provider.  Check your wound every day for signs of infection. Have a caregiver do this for you if you are not able. Check for: ? Redness, swelling, or increased pain. ? More fluid or blood. ? Warmth. ? Pus or a bad smell. Skin care  Keep your skin clean and dry. Gently pat your skin dry.  Do not rub or massage your skin.  You or a caregiver should check your skin every day for any changes in color or  any new blisters or sores (ulcers). Medicines  Take over-the-counter and prescription medicines only as told by your health care provider.  If you were prescribed an antibiotic medicine, take or apply it as told by your health care provider. Do not stop using the antibiotic even if your condition improves. Reducing and redistributing pressure  Do not lie or sit in one position for a long time. Move or change position every 1-2 hours, or as told by your health care provider.  Use pillows or cushions to reduce pressure. Ask your health care provider to recommend cushions or pads for you. General instructions   Eat a healthy diet that includes lots of protein.  Drink enough fluid to keep your urine pale yellow.  Be as active as you can every day. Ask your health care provider to suggest safe exercises or activities.  Do not abuse drugs or alcohol.  Do not use any products that contain nicotine or tobacco, such as cigarettes, e-cigarettes, and chewing tobacco. If you need help quitting, ask your health care provider.  Keep all follow-up visits as told by your health care provider. This is important. Contact a health care provider if:  You have: ? A fever or chills. ? Pain that is not helped by medicine. ? Any changes in skin color. ? New blisters or sores. ? Pus or a bad smell coming from your wound. ? Redness, swelling, or pain around your wound. ? More fluid or blood coming from your wound.  Your wound does not improve after 1-2 weeks of treatment. Summary  A pressure injury is damage to the skin and underlying tissue that results from pressure being applied to an area of the body.  Do not lie or sit in one position for a long time. Your health care provider may advise you to move or change position every 1-2 hours.  Follow instructions from your health care provider about how to take care of your wound.  Keep all follow-up visits as told by your health care provider. This  is important. This information is not intended to replace advice given to you by your health care provider. Make sure you discuss any questions you have with your health care provider. Document Released: 09/01/2005 Document Revised: 03/31/2018 Document Reviewed: 03/31/2018 Elsevier Patient Education  2020 [employer]  

## 2019-06-08 NOTE — Progress Notes (Signed)
Virtual Visit via Telephone Note  I connected with [name] on September 21 by telephone with video conferencing and verified that I was speaking with the correct person using two identifiers.  Location: Patient: Home Provider: Pain control center   I discussed the limitations, risks, security and privacy concerns of performing an evaluation and management service by telephone and the availability of in person appointments. I also discussed with the patient that there may be a patient responsible charge related to this service. The patient expressed understanding and agreed to proceed.   History of Present Illness: I was able to touch base with Mr. Gladman via [employer].  He stated that he is doing well with his current regimen and no problems are noted.  The quality characteristic and distribution of the hips his diffuse pain are stable with no changes reported.  His strength is been at baseline.  He is taking his medications as prescribed and these are working well for him.  He denies any illicit use or diverting activity.  No side effects with the medications are reported and he is getting good functional relief and deriving good functional improvement in his lifestyle with the medicines.  Unfortunately he has failed more conservative therapy.  Otherwise he is in his usual state of health.    Observations/Objective: Current Outpatient Medications:  .  acetaminophen (TYLENOL) 500 MG tablet, Take 500-1,000 mg by mouth every 6 (six) hours as needed for headache. , Disp: , Rfl:  .  albuterol (PROVENTIL HFA;VENTOLIN HFA) 108 (90 Base) MCG/ACT inhaler, Inhale 1-2 puffs into the lungs every 6 (six) hours as needed for wheezing or shortness of breath., Disp: , Rfl:  .  atorvastatin (LIPITOR) 10 MG tablet, Take 1 tablet (10 mg total) by mouth daily at 6 PM., Disp: 90 tablet, Rfl: 3 .  Continuous Blood Gluc Sensor (FREESTYLE LIBRE 14 DAY SENSOR) MISC, U Q 14 DAYS  UTD, Disp: , Rfl: 5 .  diclofenac sodium (VOLTAREN) 1 % GEL, Apply 2 g topically 4 (four) times daily., Disp: 1 Tube, Rfl: 1 .  diphenhydrAMINE (BENADRYL) 25 mg capsule, Take 25 mg by mouth 3 (three) times daily as needed for itching. , Disp: , Rfl:  .  docusate calcium (V-R DOCUSATE CALCIUM) 240 MG capsule, Take 240 mg by mouth 2 (two) times daily as needed for mild constipation., Disp: , Rfl:  .  furosemide (LASIX) 40 MG tablet, Take 1 tablet (40 mg total) by mouth daily., Disp: 90 tablet, Rfl: 3 .  glucose blood (FREESTYLE LITE) test strip, Use as instructed, Disp: 400 each, Rfl: 3 .  glucose blood test strip, Check qid Use as instructed, Disp: 400 each, Rfl: 12 .  HUMULIN R U-500 KWIKPEN 500 UNIT/ML kwikpen, Inject 0-125 Units into the skin 4 (four) times daily -  with meals and at bedtime. Sliding scale: (u500 insulin) Breakfast: 70-90: 51u, 91-130: 85u, 131-150: 90u, 151-200: 95u, 201-250: 100u, 251-300: 105u, 301-350: 110u, 351-400: 115u, 401-450: 120u, >450: 125u Lunch:  70-90: 45u, 91-130: 75u, 131-150: 80u, 151-200: 85u, 201-250: 90u, 251-300: 95u, 301-350: 100u, 351-400: 105u, 401-450: 110u, >450: 115u Supper: 70-90: 42u, 91-130: 70u, 131-150: 75u, 151-200: 80u, 201-250: 85u, 251-300: 90u, 301-350: 95u, 351-400: 100u, 401-450: 105u, >450: 110u Nighttime:  70-90: 0u, 91-130: 0u, 131-150: 0u, 151-200: 0u, 201-250: 0u, 251-300: 1u, 301-350: 2u, 351-400: 3u, 401-450: 4u, >450: 5u, Disp: , Rfl: 11 .  Lancets (FREESTYLE) lancets, Qid check glucoseUse as instructed, Disp: 400 each, Rfl: 3 .  levocetirizine (XYZAL) 5 MG tablet, Take 5 mg by mouth daily as needed for allergies. , Disp: , Rfl:  .  lidocaine (XYLOCAINE) 5 % ointment, Apply 1 application topically as needed., Disp: 35.44 g, Rfl: 1 .  linaclotide (LINZESS) 72 MCG capsule, Take 1 capsule (72 mcg total) by mouth daily before breakfast. (Patient taking differently: Take 72 mcg by mouth daily as needed (constipation.). ), Disp: 90 capsule, Rfl:  3 .  LORazepam (ATIVAN) 0.5 MG tablet, Take 0.5 mg by mouth 3 (three) times daily as needed for anxiety., Disp: , Rfl:  .  losartan (COZAAR) 25 MG tablet, Take 1 tablet (25 mg total) by mouth daily., Disp: 90 tablet, Rfl: 3 .  methylPREDNISolone (MEDROL DOSEPAK) 4 MG TBPK tablet, Take according to pack instructions, Disp: 21 tablet, Rfl: 0 .  mupirocin ointment (BACTROBAN) 2 %, Apply 1 application topically 2 (two) times daily., Disp: 30 g, Rfl: 0 .  naloxone (NARCAN) nasal spray 4 mg/0.1 mL, As directed for opioid induced respiratory depression, Disp: 1 kit, Rfl: 1 .  nystatin (MYCOSTATIN) 100000 UNIT/ML suspension, Take 5 mLs by mouth 4 (four) times daily as needed. For mouth sores., Disp: , Rfl:  .  nystatin (MYCOSTATIN/NYSTOP) powder, Apply 1 application topically 2 (two) times daily as needed. For yeast around skin folds, Disp: , Rfl:  .  ondansetron (ZOFRAN-ODT) 4 MG disintegrating tablet, Take 1 tablet (4 mg total) by mouth 2 (two) times daily as needed for nausea or vomiting., Disp: 60 tablet, Rfl: 1 .  OVER THE COUNTER MEDICATION, Take 1 packet by mouth daily. Crystal Light Lemonade Daily, Disp: , Rfl:  .  Oxycodone HCl 10 MG TABS, Take 1 tablet (10 mg total) by mouth 4 (four) times daily., Disp: 120 tablet, Rfl: 0 .  [START ON 06/19/2019] Oxycodone HCl 10 MG TABS, Take 1 tablet (10 mg total) by mouth 4 (four) times daily., Disp: 120 tablet, Rfl: 0 .  [START ON 07/19/2019] Oxycodone HCl 10 MG TABS, Take 1 tablet (10 mg total) by mouth 4 (four) times daily., Disp: 120 tablet, Rfl: 0 .  pantoprazole (PROTONIX) 40 MG tablet, Take 1 tablet (40 mg total) by mouth 2 (two) times daily., Disp: 90 tablet, Rfl: 1 .  pregabalin (LYRICA) 100 MG capsule, Take 1 capsule (100 mg total) by mouth 3 (three) times daily., Disp: 90 capsule, Rfl: 2 .  promethazine (PHENERGAN) 25 MG tablet, Take 1 tablet (25 mg total) by mouth every 8 (eight) hours as needed for nausea or vomiting., Disp: 90 tablet, Rfl: 1 .   tamsulosin (FLOMAX) 0.4 MG CAPS capsule, Take 1 capsule (0.4 mg total) by mouth at bedtime., Disp: 30 capsule, Rfl: 0 .  tiZANidine (ZANAFLEX) 2 MG tablet, Take 1 tablet (2 mg total) by mouth every 8 (eight) hours as needed for muscle spasms., Disp: 15 tablet, Rfl: 0 .  traZODone (DESYREL) 100 MG tablet, Take 1 tablet (100 mg total) by mouth at bedtime., Disp: 90 tablet, Rfl: 3 .  triamcinolone cream (KENALOG) 0.1 %, Apply 1 application topically 2 (two) times daily as needed (rash/itching)., Disp: , Rfl:    Assessment and Plan: 1. DDD (degenerative disc disease), thoracic   2. Chronic pain syndrome   3. Right sided weakness   4. Failed back surgical syndrome   5. Chronic, continuous use of opioids   6. Neck pain   7. Pain of both shoulder joints   Based on our discussion today I am going to refill his medications for October  4 and November 3 after review of the [facility] practitioner database information.  I want him to continue with her current regimen.  I encouraged him to continue with stretching exercises as tolerated.  He is to continue follow-up with his primary care physicians for his baseline medical care with a schedule return to clinic in 2 months.  He is also instructed to contact the pain control center should he have any troubles in the meantime.  Follow Up Instructions:    I discussed the assessment and treatment plan with the patient. The patient was provided an opportunity to ask questions and all were answered. The patient agreed with the plan and demonstrated an understanding of the instructions.   The patient was advised to call back or seek an in-person evaluation if the symptoms worsen or if the condition fails to improve as anticipated.  I provided 30 minutes of non-face-to-face time during this encounter.   Molli Barrows, MD

## 2019-06-24 DIAGNOSIS — R3912 Poor urinary stream: Secondary | ICD-10-CM | POA: Diagnosis not present

## 2019-06-24 DIAGNOSIS — N200 Calculus of kidney: Secondary | ICD-10-CM | POA: Diagnosis not present

## 2019-07-15 DIAGNOSIS — R3912 Poor urinary stream: Secondary | ICD-10-CM | POA: Diagnosis not present

## 2019-07-15 DIAGNOSIS — N200 Calculus of kidney: Secondary | ICD-10-CM | POA: Diagnosis not present

## 2019-07-27 ENCOUNTER — Other Ambulatory Visit: Payer: Self-pay

## 2019-07-27 ENCOUNTER — Ambulatory Visit (INDEPENDENT_AMBULATORY_CARE_PROVIDER_SITE_OTHER): Payer: Medicare Other | Admitting: Internal Medicine

## 2019-07-27 VITALS — Ht 73.0 in | Wt 275.0 lb

## 2019-07-27 DIAGNOSIS — R05 Cough: Secondary | ICD-10-CM | POA: Diagnosis not present

## 2019-07-27 DIAGNOSIS — Z20828 Contact with and (suspected) exposure to other viral communicable diseases: Secondary | ICD-10-CM

## 2019-07-27 DIAGNOSIS — Z20822 Contact with and (suspected) exposure to covid-19: Secondary | ICD-10-CM

## 2019-07-27 DIAGNOSIS — R059 Cough, unspecified: Secondary | ICD-10-CM

## 2019-07-27 MED ORDER — GUAIFENESIN ER 600 MG PO TB12: 600.0000 mg | ORAL_TABLET | Freq: Two times a day (BID) | ORAL | 0 refills | Status: DC | PRN
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
## 2019-07-29 LAB — NOVEL CORONAVIRUS, NAA: SARS-CoV-2, NAA: DETECTED — AB

## 2019-07-31 ENCOUNTER — Emergency Department (HOSPITAL_COMMUNITY)
Admission: EM | Admit: 2019-07-31 | Discharge: 2019-08-01 | Disposition: A | Discharge status: Home / Self Care | Payer: Medicare Other | Attending: Emergency Medicine | Admitting: Emergency Medicine

## 2019-07-31 ENCOUNTER — Encounter (HOSPITAL_COMMUNITY): Payer: Self-pay | Admitting: Emergency Medicine

## 2019-07-31 ENCOUNTER — Emergency Department (HOSPITAL_COMMUNITY): Payer: Medicare Other

## 2019-07-31 ENCOUNTER — Other Ambulatory Visit: Payer: Self-pay

## 2019-07-31 DIAGNOSIS — Z89511 Acquired absence of right leg below knee: Secondary | ICD-10-CM | POA: Insufficient documentation

## 2019-07-31 DIAGNOSIS — I5032 Chronic diastolic (congestive) heart failure: Secondary | ICD-10-CM | POA: Diagnosis not present

## 2019-07-31 DIAGNOSIS — Z794 Long term (current) use of insulin: Secondary | ICD-10-CM | POA: Insufficient documentation

## 2019-07-31 DIAGNOSIS — R531 Weakness: Secondary | ICD-10-CM | POA: Diagnosis not present

## 2019-07-31 DIAGNOSIS — Z743 Need for continuous supervision: Secondary | ICD-10-CM | POA: Diagnosis not present

## 2019-07-31 DIAGNOSIS — R509 Fever, unspecified: Secondary | ICD-10-CM | POA: Diagnosis not present

## 2019-07-31 DIAGNOSIS — Z85828 Personal history of other malignant neoplasm of skin: Secondary | ICD-10-CM | POA: Insufficient documentation

## 2019-07-31 DIAGNOSIS — U071 COVID-19: Secondary | ICD-10-CM | POA: Diagnosis not present

## 2019-07-31 DIAGNOSIS — I110 Hypertensive heart disease with heart failure: Secondary | ICD-10-CM | POA: Insufficient documentation

## 2019-07-31 DIAGNOSIS — J449 Chronic obstructive pulmonary disease, unspecified: Secondary | ICD-10-CM | POA: Diagnosis not present

## 2019-07-31 DIAGNOSIS — Z87891 Personal history of nicotine dependence: Secondary | ICD-10-CM | POA: Diagnosis not present

## 2019-07-31 DIAGNOSIS — E1165 Type 2 diabetes mellitus with hyperglycemia: Secondary | ICD-10-CM | POA: Diagnosis not present

## 2019-07-31 DIAGNOSIS — Z79899 Other long term (current) drug therapy: Secondary | ICD-10-CM | POA: Insufficient documentation

## 2019-07-31 DIAGNOSIS — E119 Type 2 diabetes mellitus without complications: Secondary | ICD-10-CM | POA: Diagnosis not present

## 2019-07-31 DIAGNOSIS — I2510 Atherosclerotic heart disease of native coronary artery without angina pectoris: Secondary | ICD-10-CM | POA: Insufficient documentation

## 2019-07-31 DIAGNOSIS — R05 Cough: Secondary | ICD-10-CM | POA: Diagnosis not present

## 2019-07-31 DIAGNOSIS — R0902 Hypoxemia: Secondary | ICD-10-CM | POA: Diagnosis not present

## 2019-07-31 LAB — COMPREHENSIVE METABOLIC PANEL
ALT: 19 U/L (ref 0–44)
AST: 29 U/L (ref 15–41)
Albumin: 3.5 g/dL (ref 3.5–5.0)
Alkaline Phosphatase: 113 U/L (ref 38–126)
Anion gap: 13 (ref 5–15)
BUN: 10 mg/dL (ref 8–23)
CO2: 20 mmol/L — ABNORMAL LOW (ref 22–32)
Calcium: 10.3 mg/dL (ref 8.9–10.3)
Chloride: 98 mmol/L (ref 98–111)
Creatinine, Ser: 0.95 mg/dL (ref 0.61–1.24)
GFR calc Af Amer: >60 mL/min (ref >60)
GFR calc non Af Amer: >60 mL/min (ref >60)
Glucose, Bld: 270 mg/dL — ABNORMAL HIGH (ref 70–99)
Potassium: 3.9 mmol/L (ref 3.5–5.1)
Sodium: 131 mmol/L — ABNORMAL LOW (ref 135–145)
Total Bilirubin: 1.5 mg/dL — ABNORMAL HIGH (ref 0.3–1.2)
Total Protein: 7.1 g/dL (ref 6.5–8.1)

## 2019-07-31 LAB — CBC WITH DIFFERENTIAL/PLATELET
Abs Immature Granulocytes: 0.04 10*3/uL (ref 0.00–0.07)
Basophils Absolute: 0 10*3/uL (ref 0.0–0.1)
Basophils Relative: 0 %
Eosinophils Absolute: 0 10*3/uL (ref 0.0–0.5)
Eosinophils Relative: 0 %
HCT: 35.2 % — ABNORMAL LOW (ref 39.0–52.0)
Hemoglobin: 12.4 g/dL — ABNORMAL LOW (ref 13.0–17.0)
Immature Granulocytes: 1 %
Lymphocytes Relative: 25 %
Lymphs Abs: 1.9 10*3/uL (ref 0.7–4.0)
MCH: 34.4 pg — ABNORMAL HIGH (ref 26.0–34.0)
MCHC: 35.2 g/dL (ref 30.0–36.0)
MCV: 97.8 fL (ref 80.0–100.0)
Monocytes Absolute: 0.6 10*3/uL (ref 0.1–1.0)
Monocytes Relative: 8 %
Neutro Abs: 5 10*3/uL (ref 1.7–7.7)
Neutrophils Relative %: 66 %
Platelets: 246 10*3/uL (ref 150–400)
RBC: 3.6 MIL/uL — ABNORMAL LOW (ref 4.22–5.81)
RDW: 15.1 % (ref 11.5–15.5)
WBC: 7.6 10*3/uL (ref 4.0–10.5)
nRBC: 0 % (ref 0.0–0.2)

## 2019-07-31 LAB — LACTIC ACID, PLASMA: Lactic Acid, Venous: 1.9 mmol/L (ref 0.5–1.9)

## 2019-07-31 MED ORDER — LORAZEPAM 0.5 MG PO TABS
0.5000 mg | ORAL_TABLET | Freq: Once | ORAL | Status: AC
  Administered 2019-07-31: 0.5 mg via ORAL
  Filled 2019-07-31: qty 1

## 2019-07-31 MED ORDER — SODIUM CHLORIDE 0.9% FLUSH: 3.0000 mL | Freq: Once | INTRAVENOUS | Status: DC

## 2019-07-31 MED ORDER — ACETAMINOPHEN 325 MG PO TABS
650.0000 mg | ORAL_TABLET | Freq: Once | ORAL | Status: AC | PRN
  Administered 2019-07-31: 20:00:00 650 mg via ORAL
  Filled 2019-07-31: qty 2

## 2019-07-31 NOTE — ED Notes (Addendum)
Patient's wife called and states that he takes ativan on a regular basis and takes it PRN for anxiety.  Patient is becoming anxious waiting in the waiting room at this time.  Wife [name] [phone].  Dr Vanita Panda notified.  New orders per Dr Vanita Panda.

## 2019-07-31 NOTE — ED Triage Notes (Signed)
Patient from home, was tested positive for Covid 19 on Wednesday.  Patient feeling worse with the diagnosis.  No shortness of breath, no chest pain.  Patient does have a cough.  Patient CAOx4.

## 2019-08-01 LAB — URINALYSIS, ROUTINE W REFLEX MICROSCOPIC
Bacteria, UA: NONE SEEN
Bilirubin Urine: NEGATIVE
Glucose, UA: >=500 mg/dL — AB
Ketones, ur: 20 mg/dL — AB
Leukocytes,Ua: NEGATIVE
Nitrite: NEGATIVE
Protein, ur: 30 mg/dL — AB
Specific Gravity, Urine: 1.024 (ref 1.005–1.030)
pH: 5 (ref 5.0–8.0)

## 2019-08-01 NOTE — ED Notes (Signed)
D/c home. avs given opportunity for questions provided answered with teachback
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
## 2019-08-01 NOTE — Discharge Instructions (Addendum)
You may alternate between Tylenol 1000 mg every 6 hours as needed for fever and pain and ibuprofen 800 mg every 8 hours as needed for fever and pain.  Please take ibuprofen with food.  Please rest and increase your fluid intake at home.  There are no specific outpatient treatments for COVID-19 as this is a viral illness.  You do not need to be on antibiotics.  If you begin having chest pain, shortness of breath, confusion, vomiting that will not stop, blue lips or blue fingertips, feel like you are going to pass out or you do pass out, please return to the emergency department.  You will need to quarantine for at least 10 days after the onset of your symptoms.  You will need to be fever free without using Tylenol or ibuprofen for 3 full days before coming out of quarantine.  Any recent close contacts in the past 2 weeks before you developed symptoms/had a positive test will need to be notified and they will need to quarantine for 14 days since they have last seen you.

## 2019-08-03 ENCOUNTER — Encounter: Payer: Medicare Other | Admitting: Anesthesiology

## 2019-08-04 ENCOUNTER — Encounter: Payer: Self-pay | Admitting: Anesthesiology

## 2019-08-04 ENCOUNTER — Telehealth: Payer: Self-pay | Admitting: Internal Medicine

## 2019-08-04 ENCOUNTER — Ambulatory Visit: Payer: Medicare Other | Attending: Anesthesiology | Admitting: Anesthesiology

## 2019-08-04 ENCOUNTER — Other Ambulatory Visit: Payer: Self-pay

## 2019-08-04 ENCOUNTER — Encounter (HOSPITAL_COMMUNITY): Payer: Self-pay | Admitting: Emergency Medicine

## 2019-08-04 ENCOUNTER — Emergency Department (HOSPITAL_COMMUNITY): Payer: Medicare Other

## 2019-08-04 ENCOUNTER — Inpatient Hospital Stay (HOSPITAL_COMMUNITY)
Admission: EM | Admit: 2019-08-04 | Discharge: 2019-10-17 | DRG: 004 | Disposition: E | Payer: Medicare Other | Attending: Family Medicine | Admitting: Family Medicine

## 2019-08-04 DIAGNOSIS — R6520 Severe sepsis without septic shock: Secondary | ICD-10-CM | POA: Diagnosis present

## 2019-08-04 DIAGNOSIS — M25511 Pain in right shoulder: Secondary | ICD-10-CM

## 2019-08-04 DIAGNOSIS — Z79891 Long term (current) use of opiate analgesic: Secondary | ICD-10-CM

## 2019-08-04 DIAGNOSIS — J9610 Chronic respiratory failure, unspecified whether with hypoxia or hypercapnia: Secondary | ICD-10-CM | POA: Diagnosis not present

## 2019-08-04 DIAGNOSIS — B379 Candidiasis, unspecified: Secondary | ICD-10-CM | POA: Diagnosis not present

## 2019-08-04 DIAGNOSIS — E1143 Type 2 diabetes mellitus with diabetic autonomic (poly)neuropathy: Secondary | ICD-10-CM | POA: Diagnosis present

## 2019-08-04 DIAGNOSIS — Z9981 Dependence on supplemental oxygen: Secondary | ICD-10-CM

## 2019-08-04 DIAGNOSIS — Z743 Need for continuous supervision: Secondary | ICD-10-CM | POA: Diagnosis not present

## 2019-08-04 DIAGNOSIS — Z430 Encounter for attention to tracheostomy: Secondary | ICD-10-CM | POA: Diagnosis not present

## 2019-08-04 DIAGNOSIS — E1110 Type 2 diabetes mellitus with ketoacidosis without coma: Secondary | ICD-10-CM | POA: Diagnosis present

## 2019-08-04 DIAGNOSIS — J9600 Acute respiratory failure, unspecified whether with hypoxia or hypercapnia: Secondary | ICD-10-CM

## 2019-08-04 DIAGNOSIS — M542 Cervicalgia: Secondary | ICD-10-CM

## 2019-08-04 DIAGNOSIS — Z9911 Dependence on respirator [ventilator] status: Secondary | ICD-10-CM | POA: Diagnosis not present

## 2019-08-04 DIAGNOSIS — Z452 Encounter for adjustment and management of vascular access device: Secondary | ICD-10-CM

## 2019-08-04 DIAGNOSIS — N400 Enlarged prostate without lower urinary tract symptoms: Secondary | ICD-10-CM | POA: Diagnosis present

## 2019-08-04 DIAGNOSIS — G9341 Metabolic encephalopathy: Secondary | ICD-10-CM | POA: Diagnosis not present

## 2019-08-04 DIAGNOSIS — Z8619 Personal history of other infectious and parasitic diseases: Secondary | ICD-10-CM | POA: Diagnosis not present

## 2019-08-04 DIAGNOSIS — E876 Hypokalemia: Secondary | ICD-10-CM | POA: Diagnosis present

## 2019-08-04 DIAGNOSIS — J0410 Acute tracheitis without obstruction: Secondary | ICD-10-CM | POA: Diagnosis not present

## 2019-08-04 DIAGNOSIS — R069 Unspecified abnormalities of breathing: Secondary | ICD-10-CM

## 2019-08-04 DIAGNOSIS — G9340 Encephalopathy, unspecified: Secondary | ICD-10-CM | POA: Diagnosis not present

## 2019-08-04 DIAGNOSIS — Z79899 Other long term (current) drug therapy: Secondary | ICD-10-CM

## 2019-08-04 DIAGNOSIS — B9689 Other specified bacterial agents as the cause of diseases classified elsewhere: Secondary | ICD-10-CM | POA: Diagnosis not present

## 2019-08-04 DIAGNOSIS — J9611 Chronic respiratory failure with hypoxia: Secondary | ICD-10-CM | POA: Diagnosis present

## 2019-08-04 DIAGNOSIS — K449 Diaphragmatic hernia without obstruction or gangrene: Secondary | ICD-10-CM | POA: Diagnosis present

## 2019-08-04 DIAGNOSIS — U071 COVID-19: Secondary | ICD-10-CM

## 2019-08-04 DIAGNOSIS — B965 Pseudomonas (aeruginosa) (mallei) (pseudomallei) as the cause of diseases classified elsewhere: Secondary | ICD-10-CM | POA: Diagnosis not present

## 2019-08-04 DIAGNOSIS — E1122 Type 2 diabetes mellitus with diabetic chronic kidney disease: Secondary | ICD-10-CM | POA: Diagnosis not present

## 2019-08-04 DIAGNOSIS — R0602 Shortness of breath: Secondary | ICD-10-CM

## 2019-08-04 DIAGNOSIS — J9690 Respiratory failure, unspecified, unspecified whether with hypoxia or hypercapnia: Secondary | ICD-10-CM

## 2019-08-04 DIAGNOSIS — J80 Acute respiratory distress syndrome: Secondary | ICD-10-CM

## 2019-08-04 DIAGNOSIS — Z95828 Presence of other vascular implants and grafts: Secondary | ICD-10-CM

## 2019-08-04 DIAGNOSIS — E870 Hyperosmolality and hypernatremia: Secondary | ICD-10-CM | POA: Diagnosis not present

## 2019-08-04 DIAGNOSIS — G92 Toxic encephalopathy: Secondary | ICD-10-CM | POA: Diagnosis not present

## 2019-08-04 DIAGNOSIS — E871 Hypo-osmolality and hyponatremia: Secondary | ICD-10-CM | POA: Diagnosis not present

## 2019-08-04 DIAGNOSIS — J95851 Ventilator associated pneumonia: Secondary | ICD-10-CM | POA: Diagnosis not present

## 2019-08-04 DIAGNOSIS — J984 Other disorders of lung: Secondary | ICD-10-CM | POA: Diagnosis not present

## 2019-08-04 DIAGNOSIS — G894 Chronic pain syndrome: Secondary | ICD-10-CM | POA: Diagnosis not present

## 2019-08-04 DIAGNOSIS — Z9049 Acquired absence of other specified parts of digestive tract: Secondary | ICD-10-CM

## 2019-08-04 DIAGNOSIS — Z8249 Family history of ischemic heart disease and other diseases of the circulatory system: Secondary | ICD-10-CM

## 2019-08-04 DIAGNOSIS — I5030 Unspecified diastolic (congestive) heart failure: Secondary | ICD-10-CM | POA: Diagnosis not present

## 2019-08-04 DIAGNOSIS — N200 Calculus of kidney: Secondary | ICD-10-CM | POA: Diagnosis not present

## 2019-08-04 DIAGNOSIS — M549 Dorsalgia, unspecified: Secondary | ICD-10-CM | POA: Diagnosis present

## 2019-08-04 DIAGNOSIS — Z1635 Resistance to multiple antimicrobial drugs: Secondary | ICD-10-CM | POA: Diagnosis not present

## 2019-08-04 DIAGNOSIS — Y832 Surgical operation with anastomosis, bypass or graft as the cause of abnormal reaction of the patient, or of later complication, without mention of misadventure at the time of the procedure: Secondary | ICD-10-CM | POA: Diagnosis not present

## 2019-08-04 DIAGNOSIS — M961 Postlaminectomy syndrome, not elsewhere classified: Secondary | ICD-10-CM

## 2019-08-04 DIAGNOSIS — R29810 Facial weakness: Secondary | ICD-10-CM | POA: Diagnosis not present

## 2019-08-04 DIAGNOSIS — M5134 Other intervertebral disc degeneration, thoracic region: Secondary | ICD-10-CM | POA: Diagnosis not present

## 2019-08-04 DIAGNOSIS — Z89511 Acquired absence of right leg below knee: Secondary | ICD-10-CM | POA: Diagnosis not present

## 2019-08-04 DIAGNOSIS — F1190 Opioid use, unspecified, uncomplicated: Secondary | ICD-10-CM | POA: Diagnosis not present

## 2019-08-04 DIAGNOSIS — J151 Pneumonia due to Pseudomonas: Secondary | ICD-10-CM | POA: Diagnosis present

## 2019-08-04 DIAGNOSIS — J1289 Other viral pneumonia: Secondary | ICD-10-CM

## 2019-08-04 DIAGNOSIS — Z85828 Personal history of other malignant neoplasm of skin: Secondary | ICD-10-CM

## 2019-08-04 DIAGNOSIS — J181 Lobar pneumonia, unspecified organism: Secondary | ICD-10-CM | POA: Diagnosis not present

## 2019-08-04 DIAGNOSIS — Y848 Other medical procedures as the cause of abnormal reaction of the patient, or of later complication, without mention of misadventure at the time of the procedure: Secondary | ICD-10-CM | POA: Diagnosis not present

## 2019-08-04 DIAGNOSIS — B9729 Other coronavirus as the cause of diseases classified elsewhere: Secondary | ICD-10-CM | POA: Diagnosis not present

## 2019-08-04 DIAGNOSIS — Z888 Allergy status to other drugs, medicaments and biological substances status: Secondary | ICD-10-CM | POA: Diagnosis not present

## 2019-08-04 DIAGNOSIS — S31000D Unspecified open wound of lower back and pelvis without penetration into retroperitoneum, subsequent encounter: Secondary | ICD-10-CM

## 2019-08-04 DIAGNOSIS — E785 Hyperlipidemia, unspecified: Secondary | ICD-10-CM | POA: Diagnosis present

## 2019-08-04 DIAGNOSIS — M4628 Osteomyelitis of vertebra, sacral and sacrococcygeal region: Secondary | ICD-10-CM | POA: Diagnosis not present

## 2019-08-04 DIAGNOSIS — Z6831 Body mass index (BMI) 31.0-31.9, adult: Secondary | ICD-10-CM

## 2019-08-04 DIAGNOSIS — I110 Hypertensive heart disease with heart failure: Secondary | ICD-10-CM | POA: Diagnosis present

## 2019-08-04 DIAGNOSIS — L89150 Pressure ulcer of sacral region, unstageable: Secondary | ICD-10-CM | POA: Diagnosis not present

## 2019-08-04 DIAGNOSIS — I2510 Atherosclerotic heart disease of native coronary artery without angina pectoris: Secondary | ICD-10-CM | POA: Diagnosis present

## 2019-08-04 DIAGNOSIS — Z4682 Encounter for fitting and adjustment of non-vascular catheter: Secondary | ICD-10-CM | POA: Diagnosis not present

## 2019-08-04 DIAGNOSIS — Z66 Do not resuscitate: Secondary | ICD-10-CM | POA: Diagnosis not present

## 2019-08-04 DIAGNOSIS — J440 Chronic obstructive pulmonary disease with acute lower respiratory infection: Secondary | ICD-10-CM | POA: Diagnosis present

## 2019-08-04 DIAGNOSIS — R918 Other nonspecific abnormal finding of lung field: Secondary | ICD-10-CM | POA: Diagnosis not present

## 2019-08-04 DIAGNOSIS — L8995 Pressure ulcer of unspecified site, unstageable: Secondary | ICD-10-CM | POA: Diagnosis not present

## 2019-08-04 DIAGNOSIS — Y95 Nosocomial condition: Secondary | ICD-10-CM | POA: Diagnosis not present

## 2019-08-04 DIAGNOSIS — D509 Iron deficiency anemia, unspecified: Secondary | ICD-10-CM | POA: Diagnosis present

## 2019-08-04 DIAGNOSIS — A419 Sepsis, unspecified organism: Principal | ICD-10-CM | POA: Diagnosis present

## 2019-08-04 DIAGNOSIS — E11649 Type 2 diabetes mellitus with hypoglycemia without coma: Secondary | ICD-10-CM | POA: Diagnosis not present

## 2019-08-04 DIAGNOSIS — Z833 Family history of diabetes mellitus: Secondary | ICD-10-CM

## 2019-08-04 DIAGNOSIS — K219 Gastro-esophageal reflux disease without esophagitis: Secondary | ICD-10-CM

## 2019-08-04 DIAGNOSIS — E1169 Type 2 diabetes mellitus with other specified complication: Secondary | ICD-10-CM | POA: Diagnosis present

## 2019-08-04 DIAGNOSIS — Z960 Presence of urogenital implants: Secondary | ICD-10-CM | POA: Diagnosis not present

## 2019-08-04 DIAGNOSIS — Z931 Gastrostomy status: Secondary | ICD-10-CM | POA: Diagnosis not present

## 2019-08-04 DIAGNOSIS — J449 Chronic obstructive pulmonary disease, unspecified: Secondary | ICD-10-CM | POA: Diagnosis present

## 2019-08-04 DIAGNOSIS — Z515 Encounter for palliative care: Secondary | ICD-10-CM

## 2019-08-04 DIAGNOSIS — M51369 Other intervertebral disc degeneration, lumbar region without mention of lumbar back pain or lower extremity pain: Secondary | ICD-10-CM

## 2019-08-04 DIAGNOSIS — R404 Transient alteration of awareness: Secondary | ICD-10-CM | POA: Diagnosis not present

## 2019-08-04 DIAGNOSIS — T85638A Leakage of other specified internal prosthetic devices, implants and grafts, initial encounter: Secondary | ICD-10-CM | POA: Diagnosis not present

## 2019-08-04 DIAGNOSIS — Z7181 Spiritual or religious counseling: Secondary | ICD-10-CM | POA: Diagnosis not present

## 2019-08-04 DIAGNOSIS — L89303 Pressure ulcer of unspecified buttock, stage 3: Secondary | ICD-10-CM | POA: Diagnosis not present

## 2019-08-04 DIAGNOSIS — R0902 Hypoxemia: Secondary | ICD-10-CM

## 2019-08-04 DIAGNOSIS — E1151 Type 2 diabetes mellitus with diabetic peripheral angiopathy without gangrene: Secondary | ICD-10-CM | POA: Diagnosis not present

## 2019-08-04 DIAGNOSIS — J1282 Pneumonia due to coronavirus disease 2019: Secondary | ICD-10-CM | POA: Diagnosis not present

## 2019-08-04 DIAGNOSIS — G4700 Insomnia, unspecified: Secondary | ICD-10-CM | POA: Diagnosis not present

## 2019-08-04 DIAGNOSIS — J90 Pleural effusion, not elsewhere classified: Secondary | ICD-10-CM | POA: Diagnosis not present

## 2019-08-04 DIAGNOSIS — Z794 Long term (current) use of insulin: Secondary | ICD-10-CM

## 2019-08-04 DIAGNOSIS — L8990 Pressure ulcer of unspecified site, unspecified stage: Secondary | ICD-10-CM | POA: Insufficient documentation

## 2019-08-04 DIAGNOSIS — Z930 Tracheostomy status: Secondary | ICD-10-CM

## 2019-08-04 DIAGNOSIS — Z1624 Resistance to multiple antibiotics: Secondary | ICD-10-CM | POA: Diagnosis present

## 2019-08-04 DIAGNOSIS — I4891 Unspecified atrial fibrillation: Secondary | ICD-10-CM | POA: Diagnosis not present

## 2019-08-04 DIAGNOSIS — J189 Pneumonia, unspecified organism: Secondary | ICD-10-CM | POA: Diagnosis not present

## 2019-08-04 DIAGNOSIS — J9601 Acute respiratory failure with hypoxia: Secondary | ICD-10-CM | POA: Diagnosis not present

## 2019-08-04 DIAGNOSIS — Z0189 Encounter for other specified special examinations: Secondary | ICD-10-CM

## 2019-08-04 DIAGNOSIS — M25512 Pain in left shoulder: Secondary | ICD-10-CM

## 2019-08-04 DIAGNOSIS — E669 Obesity, unspecified: Secondary | ICD-10-CM | POA: Diagnosis present

## 2019-08-04 DIAGNOSIS — R531 Weakness: Secondary | ICD-10-CM

## 2019-08-04 DIAGNOSIS — R579 Shock, unspecified: Secondary | ICD-10-CM | POA: Diagnosis not present

## 2019-08-04 DIAGNOSIS — I739 Peripheral vascular disease, unspecified: Secondary | ICD-10-CM | POA: Diagnosis not present

## 2019-08-04 DIAGNOSIS — E0810 Diabetes mellitus due to underlying condition with ketoacidosis without coma: Secondary | ICD-10-CM | POA: Diagnosis not present

## 2019-08-04 DIAGNOSIS — Z91048 Other nonmedicinal substance allergy status: Secondary | ICD-10-CM | POA: Diagnosis not present

## 2019-08-04 DIAGNOSIS — M5136 Other intervertebral disc degeneration, lumbar region: Secondary | ICD-10-CM

## 2019-08-04 DIAGNOSIS — N179 Acute kidney failure, unspecified: Secondary | ICD-10-CM | POA: Diagnosis not present

## 2019-08-04 DIAGNOSIS — Z881 Allergy status to other antibiotic agents status: Secondary | ICD-10-CM | POA: Diagnosis not present

## 2019-08-04 DIAGNOSIS — R197 Diarrhea, unspecified: Secondary | ICD-10-CM | POA: Diagnosis not present

## 2019-08-04 DIAGNOSIS — F05 Delirium due to known physiological condition: Secondary | ICD-10-CM

## 2019-08-04 DIAGNOSIS — R7989 Other specified abnormal findings of blood chemistry: Secondary | ICD-10-CM | POA: Diagnosis not present

## 2019-08-04 DIAGNOSIS — Z4659 Encounter for fitting and adjustment of other gastrointestinal appliance and device: Secondary | ICD-10-CM

## 2019-08-04 DIAGNOSIS — Z89519 Acquired absence of unspecified leg below knee: Secondary | ICD-10-CM | POA: Diagnosis not present

## 2019-08-04 DIAGNOSIS — Z87891 Personal history of nicotine dependence: Secondary | ICD-10-CM

## 2019-08-04 DIAGNOSIS — Z87442 Personal history of urinary calculi: Secondary | ICD-10-CM

## 2019-08-04 DIAGNOSIS — R209 Unspecified disturbances of skin sensation: Secondary | ICD-10-CM | POA: Diagnosis not present

## 2019-08-04 DIAGNOSIS — R000 Tachycardia, unspecified: Secondary | ICD-10-CM | POA: Diagnosis not present

## 2019-08-04 DIAGNOSIS — L89000 Pressure ulcer of unspecified elbow, unstageable: Secondary | ICD-10-CM | POA: Diagnosis not present

## 2019-08-04 DIAGNOSIS — B359 Dermatophytosis, unspecified: Secondary | ICD-10-CM | POA: Diagnosis present

## 2019-08-04 DIAGNOSIS — Z885 Allergy status to narcotic agent status: Secondary | ICD-10-CM | POA: Diagnosis not present

## 2019-08-04 DIAGNOSIS — Z791 Long term (current) use of non-steroidal anti-inflammatories (NSAID): Secondary | ICD-10-CM

## 2019-08-04 DIAGNOSIS — K760 Fatty (change of) liver, not elsewhere classified: Secondary | ICD-10-CM | POA: Diagnosis present

## 2019-08-04 DIAGNOSIS — K3184 Gastroparesis: Secondary | ICD-10-CM | POA: Diagnosis present

## 2019-08-04 DIAGNOSIS — T4275XA Adverse effect of unspecified antiepileptic and sedative-hypnotic drugs, initial encounter: Secondary | ICD-10-CM | POA: Diagnosis not present

## 2019-08-04 DIAGNOSIS — R230 Cyanosis: Secondary | ICD-10-CM | POA: Diagnosis not present

## 2019-08-04 DIAGNOSIS — Z01818 Encounter for other preprocedural examination: Secondary | ICD-10-CM

## 2019-08-04 DIAGNOSIS — D6489 Other specified anemias: Secondary | ICD-10-CM | POA: Diagnosis present

## 2019-08-04 DIAGNOSIS — I5032 Chronic diastolic (congestive) heart failure: Secondary | ICD-10-CM | POA: Diagnosis not present

## 2019-08-04 DIAGNOSIS — I10 Essential (primary) hypertension: Secondary | ICD-10-CM | POA: Diagnosis not present

## 2019-08-04 DIAGNOSIS — Z8673 Personal history of transient ischemic attack (TIA), and cerebral infarction without residual deficits: Secondary | ICD-10-CM

## 2019-08-04 DIAGNOSIS — R0689 Other abnormalities of breathing: Secondary | ICD-10-CM | POA: Diagnosis not present

## 2019-08-04 DIAGNOSIS — E119 Type 2 diabetes mellitus without complications: Secondary | ICD-10-CM | POA: Diagnosis not present

## 2019-08-04 DIAGNOSIS — E039 Hypothyroidism, unspecified: Secondary | ICD-10-CM | POA: Diagnosis present

## 2019-08-04 DIAGNOSIS — L89159 Pressure ulcer of sacral region, unspecified stage: Secondary | ICD-10-CM | POA: Diagnosis not present

## 2019-08-04 DIAGNOSIS — I129 Hypertensive chronic kidney disease with stage 1 through stage 4 chronic kidney disease, or unspecified chronic kidney disease: Secondary | ICD-10-CM | POA: Diagnosis not present

## 2019-08-04 DIAGNOSIS — R509 Fever, unspecified: Secondary | ICD-10-CM | POA: Diagnosis not present

## 2019-08-04 LAB — CBC WITH DIFFERENTIAL/PLATELET
Abs Immature Granulocytes: 0.08 10*3/uL — ABNORMAL HIGH (ref 0.00–0.07)
Basophils Absolute: 0 10*3/uL (ref 0.0–0.1)
Basophils Relative: 0 %
Eosinophils Absolute: 0 10*3/uL (ref 0.0–0.5)
Eosinophils Relative: 0 %
HCT: 36.1 % — ABNORMAL LOW (ref 39.0–52.0)
Hemoglobin: 12.6 g/dL — ABNORMAL LOW (ref 13.0–17.0)
Immature Granulocytes: 1 %
Lymphocytes Relative: 10 %
Lymphs Abs: 1.4 10*3/uL (ref 0.7–4.0)
MCH: 34.6 pg — ABNORMAL HIGH (ref 26.0–34.0)
MCHC: 34.9 g/dL (ref 30.0–36.0)
MCV: 99.2 fL (ref 80.0–100.0)
Monocytes Absolute: 0.9 10*3/uL (ref 0.1–1.0)
Monocytes Relative: 7 %
Neutro Abs: 11.2 10*3/uL — ABNORMAL HIGH (ref 1.7–7.7)
Neutrophils Relative %: 82 %
Platelets: 389 10*3/uL (ref 150–400)
RBC: 3.64 MIL/uL — ABNORMAL LOW (ref 4.22–5.81)
RDW: 15.1 % (ref 11.5–15.5)
WBC: 13.6 10*3/uL — ABNORMAL HIGH (ref 4.0–10.5)
nRBC: 0 % (ref 0.0–0.2)

## 2019-08-04 LAB — LACTIC ACID, PLASMA
Lactic Acid, Venous: 2.1 mmol/L (ref 0.5–1.9)
Lactic Acid, Venous: 1.7 mmol/L (ref 0.5–1.9)

## 2019-08-04 LAB — URINALYSIS, ROUTINE W REFLEX MICROSCOPIC
Bilirubin Urine: NEGATIVE
Glucose, UA: >=500 mg/dL — AB
Ketones, ur: 20 mg/dL — AB
Leukocytes,Ua: NEGATIVE
Nitrite: NEGATIVE
Protein, ur: >=300 mg/dL — AB
Specific Gravity, Urine: 1.027 (ref 1.005–1.030)
pH: 5 (ref 5.0–8.0)

## 2019-08-04 LAB — COMPREHENSIVE METABOLIC PANEL
ALT: 56 U/L — ABNORMAL HIGH (ref 0–44)
AST: 124 U/L — ABNORMAL HIGH (ref 15–41)
Albumin: 2.9 g/dL — ABNORMAL LOW (ref 3.5–5.0)
Alkaline Phosphatase: 129 U/L — ABNORMAL HIGH (ref 38–126)
Anion gap: 12 (ref 5–15)
BUN: 18 mg/dL (ref 8–23)
CO2: 20 mmol/L — ABNORMAL LOW (ref 22–32)
Calcium: 9.9 mg/dL (ref 8.9–10.3)
Chloride: 95 mmol/L — ABNORMAL LOW (ref 98–111)
Creatinine, Ser: 0.91 mg/dL (ref 0.61–1.24)
GFR calc Af Amer: >60 mL/min (ref >60)
GFR calc non Af Amer: >60 mL/min (ref >60)
Glucose, Bld: 282 mg/dL — ABNORMAL HIGH (ref 70–99)
Potassium: 4.3 mmol/L (ref 3.5–5.1)
Sodium: 127 mmol/L — ABNORMAL LOW (ref 135–145)
Total Bilirubin: 2.1 mg/dL — ABNORMAL HIGH (ref 0.3–1.2)
Total Protein: 6.8 g/dL (ref 6.5–8.1)

## 2019-08-04 LAB — TRIGLYCERIDES: Triglycerides: 139 mg/dL (ref <150)

## 2019-08-04 LAB — C-REACTIVE PROTEIN: CRP: 24.6 mg/dL — ABNORMAL HIGH (ref <1.0)

## 2019-08-04 LAB — FERRITIN: Ferritin: 458 ng/mL — ABNORMAL HIGH (ref 24–336)

## 2019-08-04 LAB — PROTIME-INR
INR: 1.2 (ref 0.8–1.2)
Prothrombin Time: 15.1 seconds (ref 11.4–15.2)

## 2019-08-04 LAB — D-DIMER, QUANTITATIVE: D-Dimer, Quant: 1.41 ug/mL-FEU — ABNORMAL HIGH (ref 0.00–0.50)

## 2019-08-04 LAB — PROCALCITONIN: Procalcitonin: 1.21 ng/mL

## 2019-08-04 LAB — APTT: aPTT: 37 seconds — ABNORMAL HIGH (ref 24–36)

## 2019-08-04 LAB — LACTATE DEHYDROGENASE: LDH: 432 U/L — ABNORMAL HIGH (ref 98–192)

## 2019-08-04 LAB — FIBRINOGEN: Fibrinogen: >800 mg/dL — ABNORMAL HIGH (ref 210–475)

## 2019-08-04 MED ORDER — SODIUM CHLORIDE 0.9 % IV SOLN
200.0000 mg | Freq: Once | INTRAVENOUS | Status: AC
  Administered 2019-08-04: 200 mg via INTRAVENOUS
  Filled 2019-08-04: qty 40

## 2019-08-04 MED ORDER — OXYCODONE HCL 10 MG PO TABS: 10.0000 mg | ORAL_TABLET | Freq: Four times a day (QID) | ORAL | 0 refills | Status: AC

## 2019-08-04 MED ORDER — SODIUM CHLORIDE 0.9 % IV SOLN
1000.0000 mL | INTRAVENOUS | Status: DC
  Administered 2019-08-04: 18:00:00 1000 mL via INTRAVENOUS

## 2019-08-04 MED ORDER — SODIUM CHLORIDE 0.9 % IV SOLN
100.0000 mg | INTRAVENOUS | Status: AC
  Administered 2019-08-06 – 2019-08-08 (×4): 100 mg via INTRAVENOUS
  Filled 2019-08-04: qty 100
  Filled 2019-08-04: qty 20
  Filled 2019-08-04 (×2): qty 100
  Filled 2019-08-04: qty 20

## 2019-08-04 MED ORDER — DEXAMETHASONE SODIUM PHOSPHATE 10 MG/ML IJ SOLN
10.0000 mg | Freq: Once | INTRAMUSCULAR | Status: AC
  Administered 2019-08-04: 18:00:00 10 mg via INTRAVENOUS
  Filled 2019-08-04: qty 1

## 2019-08-04 MED ORDER — SODIUM CHLORIDE 0.9 % IV SOLN
500.0000 mg | INTRAVENOUS | Status: DC
  Administered 2019-08-04 – 2019-08-08 (×5): 500 mg via INTRAVENOUS
  Filled 2019-08-04 (×6): qty 500

## 2019-08-04 MED ORDER — SODIUM CHLORIDE 0.9 % IV SOLN
2.0000 g | INTRAVENOUS | Status: DC
  Administered 2019-08-04 – 2019-08-08 (×5): 2 g via INTRAVENOUS
  Filled 2019-08-04 (×3): qty 20
  Filled 2019-08-04: qty 2
  Filled 2019-08-04 (×2): qty 20

## 2019-08-04 NOTE — Telephone Encounter (Signed)
Called confirmed EMS is with patient now they are aware he is COVID positive, and will route [facility] per protocol and then to [facility]
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
## 2019-08-04 NOTE — ED Notes (Signed)
Patient removing oxygen and cardiac monitoring. Attempting to remove IV access. Patient redirectable and will follow commands.

## 2019-08-04 NOTE — Progress Notes (Signed)
RT tried to do an ABG on the Pt and RT wasn't able to get the ABG. RT tried to get the Pt to lay his arm off the rail of the bed and to relax his arm. The Pt would relax and then tense his arm. RT attempted 2 times. Pt on 6L with SATS 93-95. Dr. Zenia Resides was informed and said it was OK no to get the abg since his sats were better
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
## 2019-08-04 NOTE — ED Notes (Signed)
Respiratory at bedside.

## 2019-08-04 NOTE — ED Notes (Signed)
XR at bedside

## 2019-08-04 NOTE — ED Notes (Signed)
Date and time results received: 07/24/2019 6:11 PM  (use smartphrase ".now" to insert current time)  Test: Lactic  Critical Value: 2.1  Name of Provider Notified: Zenia Resides  Orders Received? Or Actions Taken?: awaiting orders

## 2019-08-04 NOTE — ED Triage Notes (Signed)
Per EMS, patient from home, Covid+ on 11/15, seen for cough at that time. AMS and respiratory distress x2 days.   71% RA 89% [place] 94% NRB  102.1 temp 130 HR 180/100 BP 44 RR  18g RC 20 R hand  102ml NS with EMS 1g Tylenol

## 2019-08-04 NOTE — ED Notes (Signed)
Patient placed on 6L [place] per Dr. Zenia Resides.

## 2019-08-04 NOTE — Telephone Encounter (Signed)
Spoke to with [name] is delirious covid 19+ rec call 911 and take to hospital womens   Please Call womens hospital admission in [place] charge nurse covid + pt confused x days coming to ED [name]  Advised James to call EMS    [employer]

## 2019-08-05 ENCOUNTER — Inpatient Hospital Stay (HOSPITAL_COMMUNITY): Payer: Medicare Other

## 2019-08-05 ENCOUNTER — Encounter: Payer: Self-pay | Admitting: Anesthesiology

## 2019-08-05 ENCOUNTER — Inpatient Hospital Stay (HOSPITAL_COMMUNITY): Payer: Medicare Other | Admitting: Certified Registered"

## 2019-08-05 ENCOUNTER — Inpatient Hospital Stay: Payer: Self-pay

## 2019-08-05 DIAGNOSIS — J1289 Other viral pneumonia: Secondary | ICD-10-CM

## 2019-08-05 DIAGNOSIS — U071 COVID-19: Secondary | ICD-10-CM | POA: Diagnosis present

## 2019-08-05 DIAGNOSIS — J80 Acute respiratory distress syndrome: Secondary | ICD-10-CM

## 2019-08-05 DIAGNOSIS — F05 Delirium due to known physiological condition: Secondary | ICD-10-CM

## 2019-08-05 HISTORY — DX: Delirium due to known physiological condition: F05

## 2019-08-05 LAB — GLUCOSE, CAPILLARY
Glucose-Capillary: 450 mg/dL — ABNORMAL HIGH (ref 70–99)
Glucose-Capillary: 376 mg/dL — ABNORMAL HIGH (ref 70–99)
Glucose-Capillary: 416 mg/dL — ABNORMAL HIGH (ref 70–99)
Glucose-Capillary: 353 mg/dL — ABNORMAL HIGH (ref 70–99)
Glucose-Capillary: 352 mg/dL — ABNORMAL HIGH (ref 70–99)
Glucose-Capillary: 287 mg/dL — ABNORMAL HIGH (ref 70–99)
Glucose-Capillary: 238 mg/dL — ABNORMAL HIGH (ref 70–99)
Glucose-Capillary: 238 mg/dL — ABNORMAL HIGH (ref 70–99)
Glucose-Capillary: 195 mg/dL — ABNORMAL HIGH (ref 70–99)
Glucose-Capillary: 211 mg/dL — ABNORMAL HIGH (ref 70–99)
Glucose-Capillary: 188 mg/dL — ABNORMAL HIGH (ref 70–99)

## 2019-08-05 LAB — BASIC METABOLIC PANEL
Anion gap: 16 — ABNORMAL HIGH (ref 5–15)
Anion gap: 11 (ref 5–15)
BUN: 39 mg/dL — ABNORMAL HIGH (ref 8–23)
BUN: 44 mg/dL — ABNORMAL HIGH (ref 8–23)
CO2: 17 mmol/L — ABNORMAL LOW (ref 22–32)
CO2: 22 mmol/L (ref 22–32)
Calcium: 9.7 mg/dL (ref 8.9–10.3)
Calcium: 10.1 mg/dL (ref 8.9–10.3)
Chloride: 102 mmol/L (ref 98–111)
Chloride: 104 mmol/L (ref 98–111)
Creatinine, Ser: 1.59 mg/dL — ABNORMAL HIGH (ref 0.61–1.24)
Creatinine, Ser: 1.37 mg/dL — ABNORMAL HIGH (ref 0.61–1.24)
GFR calc Af Amer: 52 mL/min — ABNORMAL LOW (ref >60)
GFR calc Af Amer: >60 mL/min (ref >60)
GFR calc non Af Amer: 45 mL/min — ABNORMAL LOW (ref >60)
GFR calc non Af Amer: 53 mL/min — ABNORMAL LOW (ref >60)
Glucose, Bld: 452 mg/dL — ABNORMAL HIGH (ref 70–99)
Glucose, Bld: 290 mg/dL — ABNORMAL HIGH (ref 70–99)
Potassium: 3.4 mmol/L — ABNORMAL LOW (ref 3.5–5.1)
Potassium: 2.8 mmol/L — ABNORMAL LOW (ref 3.5–5.1)
Sodium: 135 mmol/L (ref 135–145)
Sodium: 137 mmol/L (ref 135–145)

## 2019-08-05 LAB — COMPREHENSIVE METABOLIC PANEL
ALT: 69 U/L — ABNORMAL HIGH (ref 0–44)
AST: 102 U/L — ABNORMAL HIGH (ref 15–41)
Albumin: 2.8 g/dL — ABNORMAL LOW (ref 3.5–5.0)
Alkaline Phosphatase: 142 U/L — ABNORMAL HIGH (ref 38–126)
Anion gap: 23 — ABNORMAL HIGH (ref 5–15)
BUN: 23 mg/dL (ref 8–23)
CO2: 12 mmol/L — ABNORMAL LOW (ref 22–32)
Calcium: 9.7 mg/dL (ref 8.9–10.3)
Chloride: 98 mmol/L (ref 98–111)
Creatinine, Ser: 1.15 mg/dL (ref 0.61–1.24)
GFR calc Af Amer: >60 mL/min (ref >60)
GFR calc non Af Amer: >60 mL/min (ref >60)
Glucose, Bld: 507 mg/dL (ref 70–99)
Potassium: 5.1 mmol/L (ref 3.5–5.1)
Sodium: 133 mmol/L — ABNORMAL LOW (ref 135–145)
Total Bilirubin: 1.7 mg/dL — ABNORMAL HIGH (ref 0.3–1.2)
Total Protein: 6.9 g/dL (ref 6.5–8.1)

## 2019-08-05 LAB — BLOOD GAS, ARTERIAL
Acid-base deficit: 13.8 mmol/L — ABNORMAL HIGH (ref 0.0–2.0)
Acid-base deficit: 14.8 mmol/L — ABNORMAL HIGH (ref 0.0–2.0)
Acid-base deficit: 4.3 mmol/L — ABNORMAL HIGH (ref 0.0–2.0)
Bicarbonate: 13.9 mmol/L — ABNORMAL LOW (ref 20.0–28.0)
Bicarbonate: 12 mmol/L — ABNORMAL LOW (ref 20.0–28.0)
Bicarbonate: 20.6 mmol/L (ref 20.0–28.0)
Drawn by: 422461
FIO2: 100
FIO2: 100
MECHVT: 640 mL
MECHVT: 560 mL
O2 Content: 70 L/min
O2 Saturation: 87.8 %
O2 Saturation: 98.4 %
O2 Saturation: 98.5 %
PEEP: 8 cmH2O
PEEP: 10 cmH2O
Patient temperature: 98.6
Patient temperature: 99.3
Patient temperature: 98.6
RATE: 20 resp/min
RATE: 28 resp/min
pCO2 arterial: 39.5 mmHg (ref 32.0–48.0)
pCO2 arterial: 32.9 mmHg (ref 32.0–48.0)
pCO2 arterial: 39 mmHg (ref 32.0–48.0)
pH, Arterial: 7.172 — CL (ref 7.350–7.450)
pH, Arterial: 7.191 — CL (ref 7.350–7.450)
pH, Arterial: 7.342 — ABNORMAL LOW (ref 7.350–7.450)
pO2, Arterial: 71.4 mmHg — ABNORMAL LOW (ref 83.0–108.0)
pO2, Arterial: 160 mmHg — ABNORMAL HIGH (ref 83.0–108.0)
pO2, Arterial: 137 mmHg — ABNORMAL HIGH (ref 83.0–108.0)

## 2019-08-05 LAB — CBC WITH DIFFERENTIAL/PLATELET
Abs Immature Granulocytes: 0.13 10*3/uL — ABNORMAL HIGH (ref 0.00–0.07)
Basophils Absolute: 0.1 10*3/uL (ref 0.0–0.1)
Basophils Relative: 0 %
Eosinophils Absolute: 0 10*3/uL (ref 0.0–0.5)
Eosinophils Relative: 0 %
HCT: 34.8 % — ABNORMAL LOW (ref 39.0–52.0)
Hemoglobin: 11.2 g/dL — ABNORMAL LOW (ref 13.0–17.0)
Immature Granulocytes: 1 %
Lymphocytes Relative: 9 %
Lymphs Abs: 1.4 10*3/uL (ref 0.7–4.0)
MCH: 34.1 pg — ABNORMAL HIGH (ref 26.0–34.0)
MCHC: 32.2 g/dL (ref 30.0–36.0)
MCV: 106.1 fL — ABNORMAL HIGH (ref 80.0–100.0)
Monocytes Absolute: 0.7 10*3/uL (ref 0.1–1.0)
Monocytes Relative: 4 %
Neutro Abs: 13.2 10*3/uL — ABNORMAL HIGH (ref 1.7–7.7)
Neutrophils Relative %: 86 %
Platelets: 400 10*3/uL (ref 150–400)
RBC: 3.28 MIL/uL — ABNORMAL LOW (ref 4.22–5.81)
RDW: 16.2 % — ABNORMAL HIGH (ref 11.5–15.5)
WBC: 15.4 10*3/uL — ABNORMAL HIGH (ref 4.0–10.5)
nRBC: 0 % (ref 0.0–0.2)

## 2019-08-05 LAB — CBG MONITORING, ED
Glucose-Capillary: 518 mg/dL (ref 70–99)
Glucose-Capillary: 533 mg/dL (ref 70–99)
Glucose-Capillary: >600 mg/dL (ref 70–99)

## 2019-08-05 LAB — MAGNESIUM
Magnesium: 2.3 mg/dL (ref 1.7–2.4)
Magnesium: 2.5 mg/dL — ABNORMAL HIGH (ref 1.7–2.4)

## 2019-08-05 LAB — HEMOGLOBIN A1C
Hgb A1c MFr Bld: 8.2 % — ABNORMAL HIGH (ref 4.8–5.6)
Mean Plasma Glucose: 188.64 mg/dL

## 2019-08-05 LAB — BLOOD GAS, VENOUS
Acid-base deficit: 14.9 mmol/L — ABNORMAL HIGH (ref 0.0–2.0)
Bicarbonate: 12.9 mmol/L — ABNORMAL LOW (ref 20.0–28.0)
O2 Saturation: 95.9 %
Patient temperature: 98.6
pCO2, Ven: 38.2 mmHg — ABNORMAL LOW (ref 44.0–60.0)
pH, Ven: 7.155 — CL (ref 7.250–7.430)
pO2, Ven: 104 mmHg — ABNORMAL HIGH (ref 32.0–45.0)

## 2019-08-05 LAB — ABO/RH: ABO/RH(D): O NEG

## 2019-08-05 LAB — LACTIC ACID, PLASMA
Lactic Acid, Venous: 2.3 mmol/L (ref 0.5–1.9)
Lactic Acid, Venous: 2.6 mmol/L (ref 0.5–1.9)

## 2019-08-05 LAB — HIV ANTIBODY (ROUTINE TESTING W REFLEX): HIV Screen 4th Generation wRfx: NONREACTIVE

## 2019-08-05 LAB — PHOSPHORUS
Phosphorus: 4.1 mg/dL (ref 2.5–4.6)
Phosphorus: 2 mg/dL — ABNORMAL LOW (ref 2.5–4.6)

## 2019-08-05 LAB — CORTISOL: Cortisol, Plasma: 41.9 ug/dL

## 2019-08-05 LAB — TRIGLYCERIDES: Triglycerides: 295 mg/dL — ABNORMAL HIGH (ref <150)

## 2019-08-05 MED ORDER — ONDANSETRON HCL 4 MG/2ML IJ SOLN: 4.0000 mg | Freq: Four times a day (QID) | INTRAMUSCULAR | Status: DC | PRN

## 2019-08-05 MED ORDER — SUCCINYLCHOLINE CHLORIDE 20 MG/ML IJ SOLN
INTRAMUSCULAR | Status: DC | PRN
  Administered 2019-08-05: 140 mg via INTRAVENOUS

## 2019-08-05 MED ORDER — VITAL HIGH PROTEIN PO LIQD
1000.0000 mL | ORAL | Status: DC
  Administered 2019-08-05: 1000 mL

## 2019-08-05 MED ORDER — SODIUM CHLORIDE 0.9 % IV SOLN: INTRAVENOUS | Status: DC

## 2019-08-05 MED ORDER — SODIUM CHLORIDE 0.9 % IV SOLN
INTRAVENOUS | Status: DC
  Administered 2019-08-05: 09:00:00 via INTRAVENOUS

## 2019-08-05 MED ORDER — IPRATROPIUM-ALBUTEROL 0.5-2.5 (3) MG/3ML IN SOLN
3.0000 mL | Freq: Four times a day (QID) | RESPIRATORY_TRACT | Status: DC
  Administered 2019-08-05 – 2019-08-06 (×4): 3 mL via RESPIRATORY_TRACT
  Filled 2019-08-05 (×5): qty 3

## 2019-08-05 MED ORDER — SODIUM CHLORIDE 0.9 % IV SOLN
INTRAVENOUS | Status: DC
  Administered 2019-08-05: 08:00:00 via INTRAVENOUS

## 2019-08-05 MED ORDER — DOCUSATE SODIUM 50 MG/5ML PO LIQD
100.0000 mg | Freq: Two times a day (BID) | ORAL | Status: DC | PRN
  Administered 2019-08-05: 100 mg
  Filled 2019-08-05: qty 10

## 2019-08-05 MED ORDER — BISACODYL 10 MG RE SUPP
10.0000 mg | Freq: Every day | RECTAL | Status: DC | PRN
  Filled 2019-08-05: qty 1

## 2019-08-05 MED ORDER — INSULIN REGULAR(HUMAN) IN NACL 100-0.9 UT/100ML-% IV SOLN
INTRAVENOUS | Status: DC
  Administered 2019-08-05: 16:00:00 14 [IU]/h via INTRAVENOUS
  Administered 2019-08-05: 13 [IU]/h via INTRAVENOUS
  Administered 2019-08-06: 17 [IU]/h via INTRAVENOUS
  Filled 2019-08-05 (×5): qty 100

## 2019-08-05 MED ORDER — METOPROLOL TARTRATE 5 MG/5ML IV SOLN
2.5000 mg | INTRAVENOUS | Status: DC | PRN
  Administered 2019-08-05: 2.5 mg via INTRAVENOUS
  Administered 2019-08-06: 04:00:00 5 mg via INTRAVENOUS
  Administered 2019-08-06: 19:00:00 2.5 mg via INTRAVENOUS
  Administered 2019-08-06 – 2019-08-11 (×16): 5 mg via INTRAVENOUS
  Filled 2019-08-05 (×21): qty 5

## 2019-08-05 MED ORDER — SODIUM CHLORIDE 0.9% FLUSH
10.0000 mL | Freq: Two times a day (BID) | INTRAVENOUS | Status: DC
  Administered 2019-08-05: 40 mL
  Administered 2019-08-05 – 2019-08-06 (×3): 10 mL
  Administered 2019-08-07: 40 mL
  Administered 2019-08-07 – 2019-08-31 (×40): 10 mL

## 2019-08-05 MED ORDER — FENTANYL 2500MCG IN NS 250ML (10MCG/ML) PREMIX INFUSION
25.0000 ug/h | INTRAVENOUS | Status: DC
  Administered 2019-08-05 – 2019-08-06 (×3): 300 ug/h via INTRAVENOUS
  Administered 2019-08-06: 175 ug/h via INTRAVENOUS
  Filled 2019-08-05 (×4): qty 250

## 2019-08-05 MED ORDER — ENOXAPARIN SODIUM 60 MG/0.6ML ~~LOC~~ SOLN: 0.5000 mg/kg | Freq: Two times a day (BID) | SUBCUTANEOUS | Status: DC

## 2019-08-05 MED ORDER — PRO-STAT SUGAR FREE PO LIQD
60.0000 mL | Freq: Three times a day (TID) | ORAL | Status: DC
  Administered 2019-08-05 – 2019-08-09 (×11): 60 mL
  Filled 2019-08-05 (×11): qty 60

## 2019-08-05 MED ORDER — FENTANYL BOLUS VIA INFUSION
25.0000 ug | INTRAVENOUS | Status: DC | PRN
  Administered 2019-08-06 (×2): 25 ug via INTRAVENOUS
  Filled 2019-08-05: qty 25

## 2019-08-05 MED ORDER — DEXTROSE-NACL 5-0.45 % IV SOLN: INTRAVENOUS | Status: DC

## 2019-08-05 MED ORDER — LIDOCAINE HCL (CARDIAC) PF 100 MG/5ML IV SOSY
PREFILLED_SYRINGE | INTRAVENOUS | Status: DC | PRN
  Administered 2019-08-05: 60 mg via INTRAVENOUS

## 2019-08-05 MED ORDER — IPRATROPIUM-ALBUTEROL 0.5-2.5 (3) MG/3ML IN SOLN
3.0000 mL | Freq: Four times a day (QID) | RESPIRATORY_TRACT | Status: DC
  Administered 2019-08-05: 3 mL via RESPIRATORY_TRACT
  Filled 2019-08-05: qty 3

## 2019-08-05 MED ORDER — ARFORMOTEROL TARTRATE 15 MCG/2ML IN NEBU
15.0000 ug | INHALATION_SOLUTION | Freq: Two times a day (BID) | RESPIRATORY_TRACT | Status: DC
  Filled 2019-08-05: qty 2

## 2019-08-05 MED ORDER — PANTOPRAZOLE SODIUM 40 MG PO PACK
40.0000 mg | PACK | Freq: Two times a day (BID) | ORAL | Status: DC
  Administered 2019-08-05 – 2019-09-03 (×59): 40 mg
  Filled 2019-08-05 (×61): qty 20

## 2019-08-05 MED ORDER — PROPOFOL 10 MG/ML IV BOLUS
INTRAVENOUS | Status: DC | PRN
  Administered 2019-08-05: 140 mg via INTRAVENOUS

## 2019-08-05 MED ORDER — VITAL HIGH PROTEIN PO LIQD
1000.0000 mL | ORAL | Status: DC
  Administered 2019-08-05 – 2019-08-06 (×2): 1000 mL

## 2019-08-05 MED ORDER — VANCOMYCIN HCL 10 G IV SOLR: 1250.0000 mg | INTRAVENOUS | Status: DC

## 2019-08-05 MED ORDER — POTASSIUM CHLORIDE 10 MEQ/50ML IV SOLN
10.0000 meq | INTRAVENOUS | Status: AC
  Administered 2019-08-05 – 2019-08-06 (×8): 10 meq via INTRAVENOUS
  Filled 2019-08-05 (×8): qty 50

## 2019-08-05 MED ORDER — INSULIN ASPART 100 UNIT/ML ~~LOC~~ SOLN
0.0000 [IU] | SUBCUTANEOUS | Status: DC
  Filled 2019-08-05: qty 0.2

## 2019-08-05 MED ORDER — ENOXAPARIN SODIUM 40 MG/0.4ML ~~LOC~~ SOLN
40.0000 mg | Freq: Every day | SUBCUTANEOUS | Status: DC
  Administered 2019-08-05: 40 mg via SUBCUTANEOUS
  Filled 2019-08-05: qty 0.4

## 2019-08-05 MED ORDER — DEXAMETHASONE SODIUM PHOSPHATE 10 MG/ML IJ SOLN
6.0000 mg | INTRAMUSCULAR | Status: AC
  Administered 2019-08-05 – 2019-08-14 (×10): 6 mg via INTRAVENOUS
  Filled 2019-08-05 (×10): qty 1

## 2019-08-05 MED ORDER — HEPARIN (PORCINE) 25000 UT/250ML-% IV SOLN
1700.0000 [IU]/h | INTRAVENOUS | Status: DC
  Administered 2019-08-05: 1700 [IU]/h via INTRAVENOUS
  Filled 2019-08-05 (×2): qty 250

## 2019-08-05 MED ORDER — DEXTROSE-NACL 5-0.45 % IV SOLN
INTRAVENOUS | Status: DC
  Administered 2019-08-05 – 2019-08-06 (×2): via INTRAVENOUS

## 2019-08-05 MED ORDER — INSULIN ASPART 100 UNIT/ML ~~LOC~~ SOLN
0.0000 [IU] | Freq: Three times a day (TID) | SUBCUTANEOUS | Status: DC
  Filled 2019-08-05: qty 0.06

## 2019-08-05 MED ORDER — INSULIN REGULAR(HUMAN) IN NACL 100-0.9 UT/100ML-% IV SOLN: INTRAVENOUS | Status: DC

## 2019-08-05 MED ORDER — SODIUM CHLORIDE 0.9% FLUSH: 10.0000 mL | INTRAVENOUS | Status: DC | PRN

## 2019-08-05 MED ORDER — DEXTROSE 50 % IV SOLN: 0.0000 mL | INTRAVENOUS | Status: DC | PRN

## 2019-08-05 MED ORDER — FAMOTIDINE IN NACL 20-0.9 MG/50ML-% IV SOLN: 20.0000 mg | INTRAVENOUS | Status: DC

## 2019-08-05 MED ORDER — ALBUTEROL SULFATE HFA 108 (90 BASE) MCG/ACT IN AERS
2.0000 | INHALATION_SPRAY | Freq: Four times a day (QID) | RESPIRATORY_TRACT | Status: DC
  Filled 2019-08-05: qty 6.7

## 2019-08-05 MED ORDER — SODIUM CHLORIDE 0.9 % IV SOLN: 1.0000 g | INTRAVENOUS | Status: DC

## 2019-08-05 MED ORDER — VANCOMYCIN HCL 10 G IV SOLR
2000.0000 mg | Freq: Once | INTRAVENOUS | Status: AC
  Administered 2019-08-05: 2000 mg via INTRAVENOUS
  Filled 2019-08-05: qty 2000

## 2019-08-05 MED ORDER — INSULIN ASPART 100 UNIT/ML ~~LOC~~ SOLN
0.0000 [IU] | Freq: Every day | SUBCUTANEOUS | Status: DC
  Filled 2019-08-05: qty 0.05

## 2019-08-05 MED ORDER — SODIUM BICARBONATE 8.4 % IV SOLN
50.0000 meq | Freq: Once | INTRAVENOUS | Status: AC
  Administered 2019-08-05: 50 meq via INTRAVENOUS
  Filled 2019-08-05: qty 50

## 2019-08-05 MED ORDER — ENOXAPARIN SODIUM 60 MG/0.6ML ~~LOC~~ SOLN
55.0000 mg | Freq: Two times a day (BID) | SUBCUTANEOUS | Status: DC
  Filled 2019-08-05: qty 0.55

## 2019-08-05 MED ORDER — SODIUM CHLORIDE 0.9 % IV SOLN
0.3000 mg/kg/h | INTRAVENOUS | Status: DC
  Filled 2019-08-05: qty 5

## 2019-08-05 MED ORDER — PROPOFOL 1000 MG/100ML IV EMUL
0.0000 ug/kg/min | INTRAVENOUS | Status: DC
  Administered 2019-08-05 (×3): 50 ug/kg/min via INTRAVENOUS
  Administered 2019-08-05: 14:00:00 60 ug/kg/min via INTRAVENOUS
  Administered 2019-08-06 (×3): 50 ug/kg/min via INTRAVENOUS
  Administered 2019-08-06: 12:00:00 30 ug/kg/min via INTRAVENOUS
  Administered 2019-08-06: 40 ug/kg/min via INTRAVENOUS
  Filled 2019-08-05 (×8): qty 100

## 2019-08-05 MED ORDER — PRO-STAT SUGAR FREE PO LIQD: 30.0000 mL | Freq: Two times a day (BID) | ORAL | Status: DC

## 2019-08-05 MED ORDER — SODIUM CHLORIDE 0.9% FLUSH
10.0000 mL | Freq: Two times a day (BID) | INTRAVENOUS | Status: DC
  Administered 2019-08-05 – 2019-08-06 (×3): 10 mL
  Administered 2019-08-07 (×2): 40 mL

## 2019-08-05 MED ORDER — VANCOMYCIN HCL 10 G IV SOLR
1250.0000 mg | Freq: Two times a day (BID) | INTRAVENOUS | Status: DC
  Filled 2019-08-05: qty 1250

## 2019-08-05 MED ORDER — ARFORMOTEROL TARTRATE 15 MCG/2ML IN NEBU: 15.0000 ug | INHALATION_SOLUTION | Freq: Two times a day (BID) | RESPIRATORY_TRACT | Status: DC

## 2019-08-05 MED ORDER — ONDANSETRON HCL 4 MG PO TABS: 4.0000 mg | ORAL_TABLET | Freq: Four times a day (QID) | ORAL | Status: DC | PRN

## 2019-08-05 MED ORDER — HEPARIN BOLUS VIA INFUSION
2000.0000 [IU] | Freq: Once | INTRAVENOUS | Status: AC
  Administered 2019-08-05: 16:00:00 2000 [IU] via INTRAVENOUS
  Filled 2019-08-05: qty 2000

## 2019-08-05 MED ORDER — PROPOFOL 1000 MG/100ML IV EMUL
5.0000 ug/kg/min | INTRAVENOUS | Status: DC
  Administered 2019-08-05: 80 ug/kg/min via INTRAVENOUS
  Administered 2019-08-05: 5 ug/kg/min via INTRAVENOUS
  Administered 2019-08-05: 80 ug/kg/min via INTRAVENOUS
  Filled 2019-08-05 (×6): qty 100

## 2019-08-05 MED ORDER — ENOXAPARIN SODIUM 40 MG/0.4ML ~~LOC~~ SOLN
40.0000 mg | SUBCUTANEOUS | Status: DC
  Filled 2019-08-05: qty 0.4

## 2019-08-05 MED ORDER — CHLORHEXIDINE GLUCONATE CLOTH 2 % EX PADS
6.0000 | MEDICATED_PAD | Freq: Every day | CUTANEOUS | Status: DC
  Administered 2019-08-05 – 2019-08-18 (×14): 6 via TOPICAL

## 2019-08-05 MED ORDER — IPRATROPIUM-ALBUTEROL 20-100 MCG/ACT IN AERS
1.0000 | INHALATION_SPRAY | Freq: Four times a day (QID) | RESPIRATORY_TRACT | Status: DC
  Filled 2019-08-05: qty 4

## 2019-08-05 MED ORDER — FENTANYL 2500MCG IN NS 250ML (10MCG/ML) PREMIX INFUSION
0.0000 ug/h | INTRAVENOUS | Status: DC
  Administered 2019-08-05: 25 ug/h via INTRAVENOUS
  Filled 2019-08-05: qty 250

## 2019-08-05 MED ORDER — SODIUM CHLORIDE 0.9 % IV SOLN: 500.0000 mg | INTRAVENOUS | Status: DC

## 2019-08-05 NOTE — Progress Notes (Signed)
Assumed care of patient.  Gtts verified at bedside.  Patient's wife, Shauna Hugh called and updated via phone.  All questions answered, will update her with changes and at shift change per her request.

## 2019-08-05 NOTE — ED Notes (Signed)
CRITICAL VALUE STICKER  CRITICAL VALUE: lactic acid 2.3, Glucose 605  RECEIVER (on-site recipient of call): Meghan RN  DATE & TIME NOTIFIED: 08/05/19   MESSENGER (representative from lab): lab  MD NOTIFIED: Velna Hatchet, Critical Care NP  TIME OF NOTIFICATION: 08/05/19 1003  RESPONSE: No new orders
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
## 2019-08-05 NOTE — ED Notes (Signed)
Lactic acid 2.3 critical result called from lab.

## 2019-08-05 NOTE — Progress Notes (Signed)
Pharmacy Antibiotic Note  [name] is a 68 y.o. male admitted on 07/17/2019 with pneumonia, Covid + on 11/11, intubated in ED.  Pharmacy has been consulted for Vancomycin dosing.  Scr increased to 1.6 Plan: Decrease vanc interval to 1250mg  q24, AUC 476.6, using SCr 1.6, Vd 0.5 CTX 2gm q24 Azith 500mg  IV q24 Follow renal function   Height: 6\' 1"  (185.4 cm) Weight: 246 lb 0.5 oz (111.6 kg) IBW/kg (Calculated) : 79.9  Temp (24hrs), Avg:98.8 F (37.1 C), Min:97.7 F (36.5 C), Max:100.1 F (37.8 C)  Recent Labs  Lab 07/31/19 1942 07/23/2019 1732 08/03/2019 1953 08/05/19 0438 08/05/19 0804 08/05/19 1234  WBC 7.6 13.6*  --   --  15.4*  --   CREATININE 0.95 0.91  --  1.15 1.64* 1.59*  LATICACIDVEN 1.9 2.1* 1.7  --  2.3* 2.6*    Estimated Creatinine Clearance: 59.9 mL/min (A) (by C-G formula based on SCr of 1.59 mg/dL (H)).    Allergies  Allergen Reactions  . Bactrim [Sulfamethoxazole-Trimethoprim] Other (See Comments)    Blisters in his mouth  . Budesonide-Formoterol Fumarate Other (See Comments)    Hyperglycemia  Hyperglycemia   . Advair Diskus [Fluticasone-Salmeterol] Other (See Comments)    hyperglycemia  . Hydromorphone Hives, Itching and Nausea And Vomiting  . Pulmicort [Budesonide] Other (See Comments)    Hyperglycemia   . Morphine Itching  . Soap Itching and Other (See Comments)    Other Reaction: ivory soap=itching    Antimicrobials this admission: 11/19 Ceftriaxone >>  11/19 Azithromycin >>  11/20 Vancomycin >>  Dose adjustments this admission:  Microbiology results: 11/19 BCx: sent 11/10 UCx: sent  11/11 Covid +  Thank you for allowing pharmacy to be a part of this patient's care.  Dolly Rias RPh 08/05/2019, 1:33 PM

## 2019-08-05 NOTE — Progress Notes (Signed)
Assisted with prone positioning.  Pt tolerated well. Anticipate turning supine around 0800 11/21.   Care team aware of 200 ml of coffee ground material out of NGT since intubation.  OK for heparin gtt in setting of AF and active COVID.  Will monitor closely.  Begin trickle tube feeding.    Noe Gens, NP-C [facility] 08/05/2019, 3:59 PM

## 2019-08-05 NOTE — ED Notes (Signed)
Date and time results received: 08/05/19 9:14 AM  (use smartphrase ".now" to insert current time)  Test: Vbg ph level Critical Value: 7.155  Name of Provider Notified: [name] RN   Orders Received? Or Actions Taken?: awaiting response

## 2019-08-05 NOTE — Progress Notes (Signed)
Wife called to update on prone positioning therapy.  She indicates understanding.  Discussed trickle feeding.  She has concerns regarding aspiration and his prior gastrectomy.  Reviewed with attending MD.  Madaline Brilliant to feed while prone.  Will trickle feed overnight to ensure tolerance and advance in am to max of 40 ml/hr as tolerated.    Noe Gens, NP-C [facility] 08/05/2019, 3:06 PM

## 2019-08-05 NOTE — Transfer of Care (Signed)
Immediate Anesthesia Transfer of Care Note  Patient: [name]  Procedure(s) Performed: AN AD [place]  Patient Location: ED rm 7  Anesthesia Type:intubation for covid  Level of Consciousness: sedated  Airway & Oxygen Therapy: Patient remains intubated per anesthesia plan and Patient placed on Ventilator (see vital sign flow sheet for setting)  Post-op Assessment: Report given to RN and Post -op Vital signs reviewed and stable  Post vital signs: Reviewed and stable  Last Vitals:  Vitals Value Taken Time  BP 111/60 08/05/19 0540  Temp    Pulse 147 08/05/19 0542  Resp 34 08/05/19 0542  SpO2 100 % 08/05/19 0542  Vitals shown include unvalidated device data.  Last Pain:  Vitals:   08/05/19 0348  TempSrc: Axillary  PainSc:          Complications: No apparent anesthesia complications

## 2019-08-05 NOTE — ED Notes (Signed)
CRITICAL VALUE STICKER  CRITICAL VALUE: Glu 507  RECEIVER (on-site recipient of call): Ethelene Browns RN  DATE & TIME NOTIFIED: 08/05/19 604a  MESSENGER (representative from lab): Sandy Salaam  MD NOTIFIED: Orpah Melter, MD; Ninetta Lights also paged with the value  TIME OF NOTIFICATION: 606a  RESPONSE: see orders

## 2019-08-05 NOTE — ED Notes (Signed)
Paged Dr Elsworth Soho for critical

## 2019-08-05 NOTE — Progress Notes (Signed)
[facility] ADULT ICU REPLACEMENT PROTOCOL FOR AM LAB REPLACEMENT ONLY  The patient does apply for the [facility] Adult ICU Electrolyte Replacment Protocol based on the criteria listed below:   1. Is GFR >/= 40 ml/min? Yes.    Patient's GFR today is 53 2. Is urine output >/= 0.5 ml/kg/hr for the last 6 hours? Yes.   Patient's UOP is 1.3 ml/kg/hr 3. Is BUN < 60 mg/dL? Yes.    Patient's BUN today is 44  4. Abnormal electrolyte(s): k 2.8 5. Ordered repletion with: protocol 6. If a panic level lab has been reported, has the CCM MD in charge been notified? No..   Physician:    Ronda Fairly A 08/05/2019 7:40 PM

## 2019-08-05 NOTE — Progress Notes (Signed)
Attempted Aline x 4 with 2 RTS. All attempts unsuccessful. Notified RN.

## 2019-08-05 NOTE — Progress Notes (Signed)
Initial Nutrition Assessment  RD working remotely.   DOCUMENTATION CODES:   Obesity unspecified  INTERVENTION:  - if patient remains intubated >/= 24 hours, recommend initiation of TF. - TF recommendation: Vital High Protein @ 40 ml/hr with 60 ml prostat TID. - this regimen (not including kcal from propofol) provides 1560 kcal, 174 grams protein, and 802 ml free water.    NUTRITION DIAGNOSIS:   Increased nutrient needs related to acute illness(COVID-19 positive) as evidenced by estimated needs.  GOAL:   Provide needs based on ASPEN/SCCM guidelines  MONITOR:   Vent status, Labs, Weight trends  REASON FOR ASSESSMENT:   Ventilator  ASSESSMENT:   68 y.o. male with medical history of DM, HTN, PVD s/p right BKA, BPH, chronic pain syndrome, COPD, and hyperlipidemia. He presented to the ED on 11/19 due to SOB, cough, wheezing, and confusion. He was diagnosed with COVID-19 on 11/15. He was 71% on RA when EMS arrived and he was placed on NRB. In the ED he had a temperature of 102.1. CXR showed multifocal PNA and pattern consistent with COVID-19.  Patient is intubated with OGT in place. He was intubated early this AM (~0630) by Anesthesia. Per chart review, current weight is 246 lb and weight on 9/22 was 274 lb. This indicates 28 lb weight loss (10% body weight) in the past 2 months; significant for time frame. Patient is at high risk for malnutrition.   Per notes: - COVID PNA with concern for superimposed bacterial PNA - intubated for increased WOB - hx of COPD - metabolic acidosis - type 2 DM with current hyperglycemia   Patient is currently intubated on ventilator support MV: 16 L/min Temp (24hrs), Avg:98.9 F (37.2 C), Min:98.1 F (36.7 C), Max:100.1 F (37.8 C) Propofol: 50.3 ml/hr (1328 kcal)   Labs reviewed; CBGs: 518, 533, >600, 450 mg/dl, Na: 130 mmol/l, Cl: 97 mmol/l, BUN: 37 mmol/l, creatinine: 1.64 mg/dl, LFTs slightly elevated, GFR: 43 ml/min. Medications  reviewed;   Drips; insulin @ 9 units/hr, propofol @ 80 mcg/kg/min.    NUTRITION - FOCUSED PHYSICAL EXAM:  unable to complete for COVID positive patient.   Diet Order:   Diet Order            Diet NPO time specified  Diet effective now              EDUCATION NEEDS:   No education needs have been identified at this time  Skin:  Skin Assessment: Reviewed RN Assessment  Last BM:  PTA/unknown  Height:   Ht Readings from Last 1 Encounters:  08/05/19 6\' 1"  (1.854 m)    Weight:   Wt Readings from Last 1 Encounters:  08/05/19 111.6 kg    Ideal Body Weight:  83.6 kg  BMI:  Body mass index is 32.46 kg/m.  Estimated Nutritional Needs:   Kcal:  II:[id number] kcal  Protein:  >/= 167 grams  Fluid:  >/= 2.2 L/day      Jarome Matin, MS, RD, LDN, [facility] Inpatient Clinical Dietitian Pager # [phone] After hours/weekend pager # [phone]

## 2019-08-05 NOTE — ED Notes (Signed)
Date and time results received: 08/05/19 7:03 AM  Test: pH from ABG Critical Value: 7.191  Name of Provider Notified: Dr. Orpah Melter   Orders Received? Or Actions Taken?: MD made aware.

## 2019-08-05 NOTE — Progress Notes (Signed)
Peripherally Inserted Central Catheter/Midline Placement  The IV Nurse has discussed with the patient and/or persons authorized to consent for the patient, the purpose of this procedure and the potential benefits and risks involved with this procedure.  The benefits include less needle sticks, lab draws from the catheter, and the patient may be discharged home with the catheter. Risks include, but not limited to, infection, bleeding, blood clot (thrombus formation), and puncture of an artery; nerve damage and irregular heartbeat and possibility to perform a PICC exchange if needed/ordered by physician.  Alternatives to this procedure were also discussed.  Bard Power PICC patient education guide, fact sheet on infection prevention and patient information card has been provided to patient /or left at bedside.    PICC/Midline Placement Documentation  PICC Triple Lumen 08/05/19 PICC Left Brachial 48 cm 0 cm (Active)  Indication for Insertion or Continuance of Line Prolonged intravenous therapies 08/05/19 1305  Exposed Catheter (cm) 0 cm 08/05/19 1305  Site Assessment Clean;Dry;Intact 08/05/19 1305  Lumen #1 Status Flushed;Blood return noted 08/05/19 1305  Lumen #2 Status Flushed;Blood return noted 08/05/19 1305  Lumen #3 Status Flushed;Blood return noted 08/05/19 1305  Dressing Type Transparent 08/05/19 1305  Dressing Status Clean;Dry;Intact;Antimicrobial disc in place;Other (Comment) 08/05/19 1305  Dressing Intervention New dressing 08/05/19 [id number]  Dressing Change Due 08/12/19 08/05/19 1305    Telephone consent by wife   [name] 08/05/2019, 1:07 PM

## 2019-08-05 NOTE — Progress Notes (Signed)
Inpatient Diabetes Program Recommendations  AACE/ADA: New Consensus Statement on Inpatient Glycemic Control (2015)  Target Ranges:  Prepandial:   less than 140 mg/dL      Peak postprandial:   less than 180 mg/dL (1-2 hours)      Critically ill patients:  140 - 180 mg/dL   Lab Results  Component Value Date   GLUCAP 352 (H) 08/05/2019   HGBA1C 8.2 (H) 08/05/2019    Review of Glycemic Control  COVID + Diabetes history: DM 2 Outpatient Diabetes medications: Humulin R U-500 0-125 Units into the skin 4 (four) times dailySSI  Current orders for Inpatient glycemic control:  IV insulin Endotool/DKA protocol  Decadron 6 mg Q 24 hours A1c 8.2%  BUN/Creat:  39/1.59 Trickle tube feeds to a goal rate 40 ml/hour in the am  Inpatient Diabetes Program Recommendations:    Glucose 282 on presentation. Decadron 10 and 6 mg given yesterday and overnight. Now on IV insulin in DKA. Gtt rates coming down. BMETs Q4 Beta-Hydroxybutyric acid Q8 hours.  IV insulin for now will follow trends.   Utilize Endotool for transition when appropriate (calculates basal doses based on gtt rates and trends). Order set "Hyperglycemic Crisis: Transition off IV insulin"   Thanks,  Tama Headings RN, MSN, BC-ADM Inpatient Diabetes Coordinator Team Pager [phone] (8a-5p)

## 2019-08-05 NOTE — Progress Notes (Addendum)
NAME:  WINFRED SWEDENBURG, MRN:  [id number], DOB:  [birth year], LOS: 1 ADMISSION DATE:  08/03/2019, CONSULTATION DATE:  08/05/19 CHIEF COMPLAINT:  Respiratory distress  Brief History   68 y/o M with hx of COPD, DM, HTN, PVD sp BKA, BPH, chronic pain syndrome, COPD, HTN, and HLD who presented 11/19 with chief complaint of increased WOB & confusion. He was found to be COVID positive on 11/11.  His wife reportedly is positive as well. The patient was febrile to 102.1 at home and treated with tylenol.  In ER, became increasingly tachypneic & hypoxic requiring intubation. Additional metabolic acidosis with DKA, AKI, AF.    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, difficult to control PVD sp BKA BPH Chronic pain syndrome - back pain, kidney stones Frequent Kidney Stones - multiple over the years, pain "debilitating" per wife  COPD HTN [facility] Events   11/19 Presented to [facility] ER with SOB and confusion, known COVID + 11/20 Worsening hypoxia, confusion, intubated per anesthesia in ER   Consults:  PCCM  Procedures:  ETT 11/20 >> LUE PICC 11/20 >>   Significant Diagnostic Tests:  CXR 11/20 >> dense R>L infiltrates  Micro Data:  COVID 11/11 >> positive  BCx2 11/19 >>  UC 11/19 >>  Antimicrobials:  Vanc 11/20 >> 11/20 Ceftriaxone 11/20 >> Azithromycin 11/20 >>  Interim history/subjective:  Wife reports lots of sputum production prior to presentation, stopped mucinex due to dry heaving.  Went to [facility] last Sunday for falls, fever, delirious. She reports he was evaluated and sent home.  O2 level at that time ws 93%. He was given an ativan during that stay but she is unsure of what other types of therapy he recieved. On 11/19, wife called EMS for him urinating in the floor (not urinal), pt unable to state day / altered.  Wife had friend bring O2 sat machine and 81%. PCP called ahead to the hospital. Pt was so disoriented he was trying to drink from a  urinal.   RN reports CBG's improving on insulin gtt > down to 400's, remains on propofol + fentanyl, BP stable / not on pressors, dark bloody drainage from NGT.    Objective   Blood pressure 102/63, pulse (!) 124, temperature 98.4 F (36.9 C), resp. rate (!) 25, height 6\' 1"  (1.854 m), weight 104.8 kg, SpO2 99 %.    Vent Mode: PRVC FiO2 (%):  [80 %-100 %] 80 % Set Rate:  [20 bmp-25 bmp] 25 bmp Vt Set:  [640 mL] 640 mL PEEP:  [8 cmH20] 8 cmH20 Plateau Pressure:  [27 cmH20-28 cmH20] 27 cmH20   Intake/Output Summary (Last 24 hours) at 08/05/2019 1006 Last data filed at 08/06/2019 2258 Gross per 24 hour  Intake 226.9 ml  Output --  Net 226.9 ml   Filed Weights   08/03/2019 2130  Weight: 104.8 kg    Examination: - see Dr. Timmothy Sours exam from 0500.  Observatory exam from door / Scientist, forensic: chronically ill appearing adult male lying in bed on vent in NAD HEENT: MM pink/moist, ETT Neuro: sedate, appears comfortable  CV: AF on monitor  PULM:  Non-labored, appears synchronous on vent, no accessory muscle use  GI: soft, bsx4 active  Extremities: RLE amputation  Skin: no rashes or lesions  Resolved Hospital Problem list      Assessment & Plan:    ARDS secondary to COVID PNA  Concern for possible superimposed bacterial PNA.  Intubated in ER per anesthesia. P/F ratio compatible with moderate ARDS. -adjust to [facility] adjust to 7cc/kg -wean PEEP / FiO2 per ARDS protocol > adjust to fit protocol -follow up ABG in one hour post changes  -once PICC line placed, will begin prone protocol  -follow intermittent CXR -continue decadron + remdesivir  -lovenox for DVT prophylaxis  -VAP prevention measures  -RASS GOAL -2 -abx as above, stop vancomycin   Metabolic Acidosis  DKA DM  -insulin gtt per hyperglcemic protocol  -bicarb push x1 now  -follow serial BMP's, lactic acid > repeat BMP 1400 -hold home U500 -consider linagliptin 11/21 if off insulin gtt   Atrial Fibrillation   -heparin gtt per pharmacy  -tele monitor  -PRN lopressor for HR>130, SBP >160  Hx HTN, HLD -hold home lasix, cozaar  COPD  Without acute exacerbation, no bronchospasm noted on admit exam. On home O2.   PFT's 04/2018 FVC: 3.63 L (74 %pred), FEV1: 2.67 L (69 %pred), FEV1/FVC: 74%, TLC: 5.96 L (82 %pred), DLCO 81 %pred. No significant BD response.  -continue in line duoneb  BPH -hold home flomax  -keep foley catheter   Best practice:  Diet: NPO, TF   Pain/Anxiety/Delirium protocol (if indicated): Fentanyl and propofol VAP protocol (if indicated): ordered DVT prophylaxis: heparin gtt  GI prophylaxis: PPI Glucose control: Hyperglycemia protocol  Mobility: BR, prone positioning  Code Status: Full per patient Family Communication: Wife updated via phone 11/20.  She also is COVID positive.  She has multiple concerns about prior care.  She is a Occupational psychologist at [employer].  Indicates she does not want him to leave [facility] to go to [facility].  We reviewed his current illness to include COVID 19 PNA, ARDS, DKA, AKI.  We discussed the seriousness of his illness and that they are likely in for a long ICU stay.  She indicates she would want all measures done - to include trach if necessary.  Reports she does not want his beard shaved for trach. Reviewed communication plan to include update from PCCM daily or if new changes.   Disposition: MICU  Labs   CBC: Recent Labs  Lab 07/31/19 1942 07/26/2019 1732 08/05/19 0804  WBC 7.6 13.6* 15.4*  NEUTROABS 5.0 11.2* 13.2*  HGB 12.4* 12.6* 11.2*  HCT 35.2* 36.1* 34.8*  MCV 97.8 99.2 106.1*  PLT 246 389 [id number]    Basic Metabolic Panel: Recent Labs  Lab 07/31/19 1942 07/18/2019 1732 08/05/19 0438 08/05/19 0804  NA 131* 127* 133* 130*  K 3.9 4.3 5.1 4.9  CL 98 95* 98 97*  CO2 20* 20* 12* 13*  GLUCOSE 270* 282* 507* 605*  BUN 10 18 23  37*  CREATININE 0.95 0.91 1.15 1.64*  CALCIUM 10.3 9.9 9.7 9.2  MG  --   --   --  2.3  PHOS  --   --   --  4.1    GFR: Estimated Creatinine Clearance: 56.3 mL/min (A) (by C-G formula based on SCr of 1.64 mg/dL (H)). Recent Labs  Lab 07/31/19 1942 08/03/2019 1732 07/28/2019 1946 08/13/2019 1953 08/05/19 0804  PROCALCITON  --   --  1.21  --   --   WBC 7.6 13.6*  --   --  15.4*  LATICACIDVEN 1.9 2.1*  --  1.7 2.3*    Liver Function Tests: Recent Labs  Lab 07/31/19 1942 07/17/2019 1732 08/05/19 0438 08/05/19 0804  AST 29 124* 102* 63*  ALT 19 56* 69* 59*  ALKPHOS 113 129* 142* 124  BILITOT 1.5* 2.1* 1.7* 1.8*  PROT 7.1 6.8 6.9 6.3*  ALBUMIN 3.5 2.9* 2.8* 2.7*   No results for input(s): LIPASE, AMYLASE in the last 168 hours. No results for input(s): AMMONIA in the last 168 hours.  ABG    Component Value Date/Time   PHART 7.191 (LL) 08/05/2019 0652   PCO2ART 32.9 08/05/2019 0652   PO2ART 160 (H) 08/05/2019 0652   HCO3 12.9 (L) 08/05/2019 0855   TCO2 28 05/22/2016 1041   ACIDBASEDEF 14.9 (H) 08/05/2019 0855   O2SAT 95.9 08/05/2019 0855     Coagulation Profile: Recent Labs  Lab 07/21/2019 1732  INR 1.2    Cardiac Enzymes: No results for input(s): CKTOTAL, CKMB, CKMBINDEX, TROPONINI in the last 168 hours.  HbA1C: Hgb A1c MFr Bld  Date/Time Value Ref Range Status  08/25/2018 11:08 AM 6.7 (H) 4.8 - 5.6 % Final    Comment:    (NOTE) Pre diabetes:          5.7%-6.4% Diabetes:              >6.4% Glycemic control for   <7.0% adults with diabetes   04/02/2018 08:04 AM 7.8 (H) 4.6 - 6.5 % Final    Comment:    Glycemic Control Guidelines for People with Diabetes:Non Diabetic:  <6%Goal of Therapy: <7%Additional Action Suggested:  >8%     CBG: Recent Labs  Lab 08/05/19 0800 08/05/19 0836 08/05/19 0928  GLUCAP 518* 533* >600*    Critical care time: 60 minutes     Noe Gens, NP-C [facility] 08/05/2019, 10:06 AM   PCCM:  Follow up note documentation from overnight events. Critcally ill secondary to COVID 19 ARDS + DKA with AGMA.   BP (!)  109/56    Pulse 98    Temp (!) 96.8 F (36 C)    Resp (!) 26    Ht 6\' 1"  (1.854 m)    Wt 111.6 kg    SpO2 100%    BMI 32.46 kg/m   Gen: intubated on vent  Tele: tachycardic, afib   Labs reviewed  CXR reviewed: bilateral infiltrates The patient's images have been independently reviewed by me.    A: Covid19 PNA, ARDS  AHRF on MV  DKA  AGMA Afib with RVR  HTN  HLD   P: Continue mechanical ventilation per ARDS protocol Target TVol 6-8cc/kgIBW Target Plateau Pressure < 30cm H20 Target driving pressure less than 15 cm of water Target PaO2 55-65: titrate PEEP/FiO2 per protocol As long as PaO2 to FiO2 ratio is less than 1:150 position in prone position for 16 hours a day Target CVP less than 4, diurese as necessary Ventilator associated pneumonia prevention protocol  PICC line placement today  Art line placement  Proning planned   This patient is critically ill with multiple organ system failure; which, requires frequent high complexity decision making, assessment, support, evaluation, and titration of therapies. This was completed through the application of advanced monitoring technologies and extensive interpretation of multiple databases. During this encounter critical care time was devoted to patient care services described in this note for 34 minutes.  Garner Nash, DO [facility] 08/05/2019 5:37 PM
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
## 2019-08-05 NOTE — ED Notes (Signed)
Dr. Shela Leff MD paged and notified about patient's declining spO2 and increasing heart rate. Provider gave orders for a STAT abg via respiratory therapist. Respiratory therapist notified.

## 2019-08-05 NOTE — Progress Notes (Signed)
Patient L TL PICC pulled back 3 cm as per IR recommendation per CXR result.

## 2019-08-05 NOTE — ED Notes (Addendum)
Dr. Gala Romney notified of patient's O2 declining. This RN switched patient from a nasal cannula to a non-rebreather at 15L. Pt's most recent O2 at 0130 was 91%.

## 2019-08-05 NOTE — ED Notes (Signed)
Informed [name] RN of ven pH of 7.155. Attempted to call Dr Orpah Melter but number is disconnected.

## 2019-08-05 NOTE — ED Notes (Signed)
Date and time results received: 08/05/19 4:07 AM  (use smartphrase ".now" to insert current time)  Test: ABG Critical Value: ABG pH 7.172  Name of Provider Notified: Rathorn  Orders Received? Or Actions Taken?: Orders Received - See Orders for details

## 2019-08-05 NOTE — Progress Notes (Signed)
Pharmacy Antibiotic Note  [name] is a 68 y.o. male admitted on 07/29/2019 with pneumonia, Covid + on 11/11, intubated in ED.  Pharmacy has been consulted for Vancomycin dosing.  Plan: CTX 2gm q24 Azith 500mg  IV q24 Vanc 2gm x1, then 1250mg  q12, AUC 520, using SCr 1.15, Vd 0.72   Height: 6\' 1"  (185.4 cm) Weight: 231 lb (104.8 kg) IBW/kg (Calculated) : 79.9  Temp (24hrs), Avg:99.1 F (37.3 C), Min:98.6 F (37 C), Max:100.1 F (37.8 C)  Recent Labs  Lab 07/31/19 1942 07/24/2019 1732 07/31/2019 1953 08/05/19 0438  WBC 7.6 13.6*  --   --   CREATININE 0.95 0.91  --  1.15  LATICACIDVEN 1.9 2.1* 1.7  --     Estimated Creatinine Clearance: 80.3 mL/min (by C-G formula based on SCr of 1.15 mg/dL).    Allergies  Allergen Reactions  . Bactrim [Sulfamethoxazole-Trimethoprim] Other (See Comments)    Blisters in his mouth  . Budesonide-Formoterol Fumarate Other (See Comments)    Hyperglycemia  Hyperglycemia   . Advair Diskus [Fluticasone-Salmeterol] Other (See Comments)    hyperglycemia  . Hydromorphone Hives, Itching and Nausea And Vomiting  . Pulmicort [Budesonide] Other (See Comments)    Hyperglycemia   . Morphine Itching  . Soap Itching and Other (See Comments)    Other Reaction: ivory soap=itching    Antimicrobials this admission: 11/19 Ceftriaxone >>  11/19 Azithromycin >>  11/20 Vancomycin >>  Dose adjustments this admission:  Microbiology results: 11/19 BCx: sent 11/10 UCx: sent  11/11 Covid +  Thank you for allowing pharmacy to be a part of this patient's care.  Minda Ditto PharmD 08/05/2019 7:46 AM

## 2019-08-05 NOTE — Progress Notes (Signed)
Patient tested positive for Covid 19 on 11/15 and admitted for multifocal pneumonia. Overnight event: Paged by nursing staff as patient had significantly increased work of breathing and was more confused.  Oxygen saturation between low 80s-90% despite patient being on 15 L oxygen via nonrebreather.  Patient was immediately seen and examined at bedside.  He had significantly increased work of breathing and using accessory muscles of respiration.  Appeared very uncomfortable.  Respiratory rate in the 40s to 50s.  Extremely tachycardic with heart rate in the 150s.  ABG done and revealed pH 7.17.  Case was discussed with critical care.  Anesthesia was called and patient was intubated.  Patient transferred to the ICU under critical care service.

## 2019-08-05 NOTE — Progress Notes (Signed)
[place] Progress Note Patient Name: [name] DOB: October 13, 1950 MRN: [id number]   Date of Service  08/05/2019  HPI/Events of Note  DKA - Blood glucose = 507 and urine ketones positive.   eICU Interventions  Will order: 1. Insulin IV infusion. Titrate per glucose stabilizer for DKA.     Intervention Category Major Interventions: Hyperglycemia - active titration of insulin therapy  Sommer,Steven Cornelia Copa 08/05/2019, 6:23 AM

## 2019-08-05 NOTE — Progress Notes (Signed)
PHARMACY - PHYSICIAN COMMUNICATION CRITICAL VALUE ALERT - BLOOD CULTURE IDENTIFICATION (BCID)  [name] is an 68 y.o. male who presented to [facility] on 08/14/2019 with a chief complaint of increased WOB.   Assessment: ARDS secondary to COVID pneumonia. Concern for possible superimposed bacterial pneumonia. Vancomycin discontinued earlier today. Blood cultures with gram positive cocci in 1 of 3 bottles.   Name of physician (or Provider) Contacted: Dr. Oletta Darter  Current antibiotics: Ceftriaxone, Azithromycin  Changes to prescribed antibiotics recommended:  Per discussion with MD, order one dose of Vancomycin 2g IV x 1. Follow up with primary team in AM to discuss need for continuation.   [name] 08/05/2019  10:29 PM

## 2019-08-05 NOTE — Progress Notes (Addendum)
NUTRITION NOTE  Consult received for TF initiation and management. Order currently in for Vital High Protein @ 10 ml/hr with 30 ml prostat BID. Will increase prostat to 60 ml TID. Continue trickle rate TF today and increase to 40 ml/hr in the AM, if feasible, per Brandi's note. Goal rate regimen of Vital High Protein @ 40 ml/hr with 60 ml prostat TID will, not including kcal from propofol, provide 1560 kcal, 174 grams protein, and 802 ml free water.  Free water flush, if desired, to be per MD/NP.  RD will continue to follow per protocol.   Jarome Matin, MS, RD, LDN, [facility] Inpatient Clinical Dietitian Pager # [phone] After hours/weekend pager # [phone]

## 2019-08-05 NOTE — ED Notes (Signed)
Intubation successful. Positive color change with CO2 detector.

## 2019-08-05 NOTE — Progress Notes (Signed)
Virtual Visit via Video Note  I connected with [name] on 08/05/19 at  1:15 PM EST by a video enabled telemedicine application and verified that I am speaking with the correct person using two identifiers.  Location: Patient: Home Provider: Pain control center   I discussed the limitations of evaluation and management by telemedicine and the availability of in person appointments. The patient expressed understanding and agreed to proceed.  History of Present Illness: I spoke with [name] wife regarding his current status via video Physiological scientist.  In regards to the pain management he has been doing reasonably well.  He continues to take his medications without difficulty or side effect.  He is getting good pain relief with the current medication regimen and the quality characteristic and distribution of his low back pain and pain syndrome is stable in nature.  However, he and his wife have contracted Covid.  They have recently been seen at the hospital and his wife reports that they are unwilling to admit the body for management of his Covid symptoms.  Wife reports that he has had some recent mental status changes and has been more short of breath recently.   Observations/Objective:No current facility-administered medications for this visit.  No current outpatient medications on file.  Facility-Administered Medications Ordered in Other Visits:  .  0.9 %  sodium chloride infusion, 1,000 mL, Intravenous, Continuous, Lacretia Leigh, MD, Last Rate: 125 mL/hr at 08/15/2019 1744, 1,000 mL at 08/14/2019 1744 .  0.9 %  sodium chloride infusion, , Intravenous, Continuous, Tyna Jaksch, MD, Last Rate: 75 mL/hr at 08/05/19 0854 .  arformoterol (BROVANA) nebulizer solution 15 mcg, 15 mcg, Nebulization, BID, Tyna Jaksch, MD .  azithromycin (ZITHROMAX) 500 mg in sodium chloride 0.9 % 250 mL IVPB, 500 mg, Intravenous, Q24H, Lacretia Leigh, MD, Stopped at 07/25/2019 1847 .   cefTRIAXone (ROCEPHIN) 2 g in sodium chloride 0.9 % 100 mL IVPB, 2 g, Intravenous, Q24H, Lacretia Leigh, MD, Stopped at 08/06/2019 1905 .  dexamethasone (DECADRON) injection 6 mg, 6 mg, Intravenous, Q24H, Garba, Mohammad L, MD, 6 mg at 08/05/19 0346 .  dextrose 5 %-0.45 % sodium chloride infusion, , Intravenous, Continuous, Newell Coral E, MD .  dextrose 50 % solution 0-50 mL, 0-50 mL, Intravenous, PRN, Tyna Jaksch, MD .  famotidine (PEPCID) IVPB 20 mg premix, 20 mg, Intravenous, Q24H, Tyna Jaksch, MD .  fentaNYL 2576mcg in NS 23mL (56mcg/ml) infusion-PREMIX, 0-400 mcg/hr, Intravenous, Continuous, Tyna Jaksch, MD, Last Rate: 2.5 mL/hr at 08/05/19 0526, 25 mcg/hr at 08/05/19 0526 .  insulin regular, human (MYXREDLIN) 100 units/ 100 mL infusion, , Intravenous, Continuous, Tyna Jaksch, MD, Last Rate: 13 mL/hr at 08/05/19 0902, 13 Units/hr at 08/05/19 0902 .  ipratropium-albuterol (DUONEB) 0.5-2.5 (3) MG/3ML nebulizer solution 3 mL, 3 mL, Nebulization, Q6H, Tyna Jaksch, MD, 3 mL at 08/05/19 0813 .  ketamine (KETALAR) 500 mg in sodium chloride 0.9 % 100 mL (5 mg/mL) infusion, 0.3 mg/kg/hr, Intravenous, Continuous, Tyna Jaksch, MD, Stopped at 08/05/19 0645 .  ondansetron (ZOFRAN) tablet 4 mg, 4 mg, Oral, Q6H PRN **OR** ondansetron (ZOFRAN) injection 4 mg, 4 mg, Intravenous, Q6H PRN, Garba, Mohammad L, MD .  propofol (DIPRIVAN) 1000 MG/100ML infusion, 5-80 mcg/kg/min, Intravenous, Titrated, Tyna Jaksch, MD, Last Rate: 50.3 mL/hr at 08/05/19 0749, 80 mcg/kg/min at 08/05/19 0749 .  [COMPLETED] remdesivir 200 mg in sodium chloride 0.9 % 250 mL IVPB, 200 mg, Intravenous, Once, Stopped at 08/03/2019 2258 **FOLLOWED  BY** remdesivir 100 mg in sodium chloride 0.9 % 250 mL IVPB, 100 mg, Intravenous, Q24H, Swayne, Mary M, RPH .  sodium chloride flush (NS) 0.9 % injection 10-40 mL, 10-40 mL, Intracatheter, Q12H, Tyna Jaksch, MD, 10 mL at 08/05/19 0900 .   sodium chloride flush (NS) 0.9 % injection 10-40 mL, 10-40 mL, Intracatheter, PRN, Tyna Jaksch, MD .  vancomycin (VANCOCIN) 1,250 mg in sodium chloride 0.9 % 250 mL IVPB, 1,250 mg, Intravenous, Q12H, Nyoka Cowden, Terri L, RPH .  vancomycin (VANCOCIN) 2,000 mg in sodium chloride 0.9 % 500 mL IVPB, 2,000 mg, Intravenous, Once, Gala Romney L, MD, Last Rate: 250 mL/hr at 08/05/19 0856, 2,000 mg at 08/05/19 0856   Assessment and Plan: 1. Chronic pain syndrome   2. DDD (degenerative disc disease), thoracic   3. Failed back surgical syndrome   4. Chronic, continuous use of opioids   5. Neck pain   6. Pain of both shoulder joints   7. DDD (degenerative disc disease), lumbar   8. Right sided weakness   9. Pneumonia due to COVID-19 virus   10. Acute confusional state   Based on current circumstances I think it is appropriate to go ahead and refill his medications however the bigger problem is his current confusional state with existing Covid diagnosis.  Per our video conference conversation today I have requested that [name] contact the ER and their primary care physician for further evaluation and potential work-up.  I have reviewed the [facility] practitioner database information and it is appropriate.  Refills will be given for December 3 and January 2 with a recent return to clinic in 2 months.  In the meantime they are to continue follow-up with her primary care physician and request further evaluation for [name]'s current condition.  Follow Up Instructions:    I discussed the assessment and treatment plan with the patient. The patient was provided an opportunity to ask questions and all were answered. The patient agreed with the plan and demonstrated an understanding of the instructions.   The patient was advised to call back or seek an in-person evaluation if the symptoms worsen or if the condition fails to improve as anticipated.  I provided 30 minutes of non-face-to-face  time during this encounter.   Molli Barrows, MD

## 2019-08-05 NOTE — Anesthesia Procedure Notes (Signed)
Procedure Name: Intubation Date/Time: 08/05/2019 5:13 AM Performed by: Cynda Familia, CRNA Pre-anesthesia Checklist: Patient identified, Emergency Drugs available, Suction available and Patient being monitored Patient Re-evaluated:Patient Re-evaluated prior to induction Oxygen Delivery Method: Circle System Utilized Preoxygenation: Pre-oxygenation with 100% oxygen Induction Type: IV induction Ventilation: Mask ventilation with difficulty Tube type: Oral Number of attempts: 1 Airway Equipment and Method: Stylet Placement Confirmation: ETT inserted through vocal cords under direct vision,  positive ETCO2 and breath sounds checked- equal and bilateral Secured at: 22 cm Tube secured with: Tape Dental Injury: Teeth and Oropharynx as per pre-operative assessment  Difficulty Due To: Difficult Airway- due to reduced neck mobility and Difficulty was unanticipated Future Recommendations: Recommend- induction with short-acting agent, and alternative techniques readily available Comments: Pt with full bread-- Hollis induction-- intubation AM CRNA atraumatic-- mouth unchanged-- bilat BS--- + ETCO2-- VSS Pt with known and documented difficult intubation-- Glidescope used with excellent view-- Dip with Sux given

## 2019-08-06 ENCOUNTER — Other Ambulatory Visit: Payer: Self-pay

## 2019-08-06 DIAGNOSIS — J80 Acute respiratory distress syndrome: Secondary | ICD-10-CM | POA: Diagnosis not present

## 2019-08-06 DIAGNOSIS — E0810 Diabetes mellitus due to underlying condition with ketoacidosis without coma: Secondary | ICD-10-CM

## 2019-08-06 LAB — COMPREHENSIVE METABOLIC PANEL
ALT: 59 U/L — ABNORMAL HIGH (ref 0–44)
ALT: 39 U/L (ref 0–44)
AST: 63 U/L — ABNORMAL HIGH (ref 15–41)
AST: 36 U/L (ref 15–41)
Albumin: 2.7 g/dL — ABNORMAL LOW (ref 3.5–5.0)
Albumin: 2 g/dL — ABNORMAL LOW (ref 3.5–5.0)
Alkaline Phosphatase: 124 U/L (ref 38–126)
Alkaline Phosphatase: 93 U/L (ref 38–126)
Anion gap: 20 — ABNORMAL HIGH (ref 5–15)
Anion gap: 6 (ref 5–15)
BUN: 37 mg/dL — ABNORMAL HIGH (ref 8–23)
BUN: 27 mg/dL — ABNORMAL HIGH (ref 8–23)
CO2: 13 mmol/L — ABNORMAL LOW (ref 22–32)
CO2: 22 mmol/L (ref 22–32)
Calcium: 9.2 mg/dL (ref 8.9–10.3)
Calcium: 9.5 mg/dL (ref 8.9–10.3)
Chloride: 97 mmol/L — ABNORMAL LOW (ref 98–111)
Chloride: 108 mmol/L (ref 98–111)
Creatinine, Ser: 1.64 mg/dL — ABNORMAL HIGH (ref 0.61–1.24)
Creatinine, Ser: 0.78 mg/dL (ref 0.61–1.24)
GFR calc Af Amer: 50 mL/min — ABNORMAL LOW (ref >60)
GFR calc Af Amer: >60 mL/min (ref >60)
GFR calc non Af Amer: 43 mL/min — ABNORMAL LOW (ref >60)
GFR calc non Af Amer: >60 mL/min (ref >60)
Glucose, Bld: 605 mg/dL (ref 70–99)
Glucose, Bld: 180 mg/dL — ABNORMAL HIGH (ref 70–99)
Potassium: 4.9 mmol/L (ref 3.5–5.1)
Potassium: 5.3 mmol/L — ABNORMAL HIGH (ref 3.5–5.1)
Sodium: 130 mmol/L — ABNORMAL LOW (ref 135–145)
Sodium: 136 mmol/L (ref 135–145)
Total Bilirubin: 1.8 mg/dL — ABNORMAL HIGH (ref 0.3–1.2)
Total Bilirubin: 1 mg/dL (ref 0.3–1.2)
Total Protein: 6.3 g/dL — ABNORMAL LOW (ref 6.5–8.1)
Total Protein: 5.5 g/dL — ABNORMAL LOW (ref 6.5–8.1)

## 2019-08-06 LAB — CBC WITH DIFFERENTIAL/PLATELET
Abs Immature Granulocytes: 0.1 10*3/uL — ABNORMAL HIGH (ref 0.00–0.07)
Basophils Absolute: 0 10*3/uL (ref 0.0–0.1)
Basophils Relative: 0 %
Eosinophils Absolute: 0 10*3/uL (ref 0.0–0.5)
Eosinophils Relative: 0 %
HCT: 31.5 % — ABNORMAL LOW (ref 39.0–52.0)
Hemoglobin: 10.7 g/dL — ABNORMAL LOW (ref 13.0–17.0)
Immature Granulocytes: 1 %
Lymphocytes Relative: 8 %
Lymphs Abs: 0.9 10*3/uL (ref 0.7–4.0)
MCH: 34.6 pg — ABNORMAL HIGH (ref 26.0–34.0)
MCHC: 34 g/dL (ref 30.0–36.0)
MCV: 101.9 fL — ABNORMAL HIGH (ref 80.0–100.0)
Monocytes Absolute: 0.9 10*3/uL (ref 0.1–1.0)
Monocytes Relative: 8 %
Neutro Abs: 9.3 10*3/uL — ABNORMAL HIGH (ref 1.7–7.7)
Neutrophils Relative %: 83 %
Platelets: 389 10*3/uL (ref 150–400)
RBC: 3.09 MIL/uL — ABNORMAL LOW (ref 4.22–5.81)
RDW: 16.2 % — ABNORMAL HIGH (ref 11.5–15.5)
WBC: 11.2 10*3/uL — ABNORMAL HIGH (ref 4.0–10.5)
nRBC: 0 % (ref 0.0–0.2)

## 2019-08-06 LAB — GLUCOSE, CAPILLARY
Glucose-Capillary: 160 mg/dL — ABNORMAL HIGH (ref 70–99)
Glucose-Capillary: 164 mg/dL — ABNORMAL HIGH (ref 70–99)
Glucose-Capillary: 165 mg/dL — ABNORMAL HIGH (ref 70–99)
Glucose-Capillary: 166 mg/dL — ABNORMAL HIGH (ref 70–99)
Glucose-Capillary: 172 mg/dL — ABNORMAL HIGH (ref 70–99)
Glucose-Capillary: 172 mg/dL — ABNORMAL HIGH (ref 70–99)
Glucose-Capillary: 174 mg/dL — ABNORMAL HIGH (ref 70–99)
Glucose-Capillary: 184 mg/dL — ABNORMAL HIGH (ref 70–99)
Glucose-Capillary: 184 mg/dL — ABNORMAL HIGH (ref 70–99)
Glucose-Capillary: 186 mg/dL — ABNORMAL HIGH (ref 70–99)
Glucose-Capillary: 190 mg/dL — ABNORMAL HIGH (ref 70–99)
Glucose-Capillary: 211 mg/dL — ABNORMAL HIGH (ref 70–99)
Glucose-Capillary: 262 mg/dL — ABNORMAL HIGH (ref 70–99)
Glucose-Capillary: 278 mg/dL — ABNORMAL HIGH (ref 70–99)
Glucose-Capillary: 310 mg/dL — ABNORMAL HIGH (ref 70–99)

## 2019-08-06 LAB — URINE CULTURE: Culture: <10000 — AB

## 2019-08-06 LAB — POCT I-STAT 7, (LYTES, BLD GAS, ICA,H+H)
Acid-base deficit: 3 mmol/L — ABNORMAL HIGH (ref 0.0–2.0)
Bicarbonate: 22.7 mmol/L (ref 20.0–28.0)
Calcium, Ion: 1.49 mmol/L — ABNORMAL HIGH (ref 1.15–1.40)
HCT: 32 % — ABNORMAL LOW (ref 39.0–52.0)
Hemoglobin: 10.9 g/dL — ABNORMAL LOW (ref 13.0–17.0)
O2 Saturation: 92 %
Patient temperature: 100.8
Potassium: 4.4 mmol/L (ref 3.5–5.1)
Sodium: 139 mmol/L (ref 135–145)
TCO2: 24 mmol/L (ref 22–32)
pCO2 arterial: 43.2 mmHg (ref 32.0–48.0)
pH, Arterial: 7.335 — ABNORMAL LOW (ref 7.350–7.450)
pO2, Arterial: 74 mmHg — ABNORMAL LOW (ref 83.0–108.0)

## 2019-08-06 LAB — BASIC METABOLIC PANEL
Anion gap: 7 (ref 5–15)
Anion gap: 5 (ref 5–15)
BUN: 36 mg/dL — ABNORMAL HIGH (ref 8–23)
BUN: 23 mg/dL (ref 8–23)
CO2: 25 mmol/L (ref 22–32)
CO2: 19 mmol/L — ABNORMAL LOW (ref 22–32)
Calcium: 10 mg/dL (ref 8.9–10.3)
Calcium: 7.8 mg/dL — ABNORMAL LOW (ref 8.9–10.3)
Chloride: 106 mmol/L (ref 98–111)
Chloride: 113 mmol/L — ABNORMAL HIGH (ref 98–111)
Creatinine, Ser: 0.99 mg/dL (ref 0.61–1.24)
Creatinine, Ser: 0.64 mg/dL (ref 0.61–1.24)
GFR calc Af Amer: >60 mL/min (ref >60)
GFR calc Af Amer: >60 mL/min (ref >60)
GFR calc non Af Amer: >60 mL/min (ref >60)
GFR calc non Af Amer: >60 mL/min (ref >60)
Glucose, Bld: 201 mg/dL — ABNORMAL HIGH (ref 70–99)
Glucose, Bld: 311 mg/dL — ABNORMAL HIGH (ref 70–99)
Potassium: 3.6 mmol/L (ref 3.5–5.1)
Potassium: 3.3 mmol/L — ABNORMAL LOW (ref 3.5–5.1)
Sodium: 138 mmol/L (ref 135–145)
Sodium: 137 mmol/L (ref 135–145)

## 2019-08-06 LAB — HEPARIN LEVEL (UNFRACTIONATED)
Heparin Unfractionated: <0.1 IU/mL — ABNORMAL LOW (ref 0.30–0.70)
Heparin Unfractionated: 0.16 IU/mL — ABNORMAL LOW (ref 0.30–0.70)
Heparin Unfractionated: <0.1 IU/mL — ABNORMAL LOW (ref 0.30–0.70)

## 2019-08-06 LAB — MRSA PCR SCREENING: MRSA by PCR: NEGATIVE

## 2019-08-06 LAB — TRIGLYCERIDES: Triglycerides: 656 mg/dL — ABNORMAL HIGH (ref <150)

## 2019-08-06 LAB — D-DIMER, QUANTITATIVE: D-Dimer, Quant: 1.61 ug/mL-FEU — ABNORMAL HIGH (ref 0.00–0.50)

## 2019-08-06 MED ORDER — HEPARIN (PORCINE) 25000 UT/250ML-% IV SOLN
2800.0000 [IU]/h | INTRAVENOUS | Status: DC
  Administered 2019-08-06: 2300 [IU]/h via INTRAVENOUS
  Administered 2019-08-07: 01:00:00 2600 [IU]/h via INTRAVENOUS
  Administered 2019-08-07: 2800 [IU]/h via INTRAVENOUS
  Filled 2019-08-06 (×2): qty 250

## 2019-08-06 MED ORDER — VITAL HIGH PROTEIN PO LIQD
1000.0000 mL | ORAL | Status: DC
  Administered 2019-08-06 (×2): 1000 mL

## 2019-08-06 MED ORDER — MIDAZOLAM HCL 2 MG/2ML IJ SOLN
1.0000 mg | INTRAMUSCULAR | Status: DC | PRN
  Administered 2019-08-06: 18:00:00 1 mg via INTRAVENOUS
  Filled 2019-08-06: qty 2

## 2019-08-06 MED ORDER — FENTANYL 2500MCG IN NS 250ML (10MCG/ML) PREMIX INFUSION
25.0000 ug/h | INTRAVENOUS | Status: DC
  Administered 2019-08-07: 25 ug/h via INTRAVENOUS
  Filled 2019-08-06: qty 250

## 2019-08-06 MED ORDER — OXYCODONE HCL 5 MG PO TABS
10.0000 mg | ORAL_TABLET | Freq: Four times a day (QID) | ORAL | Status: DC
  Filled 2019-08-06: qty 2

## 2019-08-06 MED ORDER — PHENYLEPHRINE HCL-NACL 10-0.9 MG/250ML-% IV SOLN: 0.0000 ug/min | INTRAVENOUS | Status: DC

## 2019-08-06 MED ORDER — ORAL CARE MOUTH RINSE
15.0000 mL | OROMUCOSAL | Status: DC
  Administered 2019-08-06 – 2019-09-19 (×401): 15 mL via OROMUCOSAL

## 2019-08-06 MED ORDER — MIDAZOLAM HCL 2 MG/2ML IJ SOLN
1.0000 mg | INTRAMUSCULAR | Status: DC | PRN
  Administered 2019-08-06: 1 mg via INTRAVENOUS
  Filled 2019-08-06: qty 2

## 2019-08-06 MED ORDER — HEPARIN (PORCINE) 25000 UT/250ML-% IV SOLN
2300.0000 [IU]/h | INTRAVENOUS | Status: DC
  Administered 2019-08-06: 2300 [IU]/h via INTRAVENOUS
  Administered 2019-08-06: 2050 [IU]/h via INTRAVENOUS
  Filled 2019-08-06: qty 250

## 2019-08-06 MED ORDER — IPRATROPIUM-ALBUTEROL 0.5-2.5 (3) MG/3ML IN SOLN: 3.0000 mL | Freq: Four times a day (QID) | RESPIRATORY_TRACT | Status: DC | PRN

## 2019-08-06 MED ORDER — PREGABALIN 100 MG PO CAPS
100.0000 mg | ORAL_CAPSULE | Freq: Three times a day (TID) | ORAL | Status: DC
  Filled 2019-08-06: qty 1

## 2019-08-06 MED ORDER — MIDAZOLAM 50MG/50ML (1MG/ML) PREMIX INFUSION
0.0000 mg/h | INTRAVENOUS | Status: DC
  Administered 2019-08-06: 2 mg/h via INTRAVENOUS
  Filled 2019-08-06: qty 50

## 2019-08-06 MED ORDER — OXYCODONE HCL 5 MG/5ML PO SOLN
10.0000 mg | Freq: Four times a day (QID) | ORAL | Status: DC
  Administered 2019-08-06 – 2019-08-09 (×11): 10 mg via NASOGASTRIC
  Filled 2019-08-06 (×11): qty 10

## 2019-08-06 MED ORDER — FENTANYL BOLUS VIA INFUSION
25.0000 ug | INTRAVENOUS | Status: DC | PRN
  Filled 2019-08-06: qty 25

## 2019-08-06 MED ORDER — HEPARIN BOLUS VIA INFUSION
2500.0000 [IU] | Freq: Once | INTRAVENOUS | Status: AC
  Administered 2019-08-06: 2500 [IU] via INTRAVENOUS
  Filled 2019-08-06: qty 2500

## 2019-08-06 MED ORDER — HEPARIN BOLUS VIA INFUSION
3000.0000 [IU] | Freq: Once | INTRAVENOUS | Status: AC
  Administered 2019-08-06: 3000 [IU] via INTRAVENOUS
  Filled 2019-08-06: qty 3000

## 2019-08-06 MED ORDER — HEPARIN BOLUS VIA INFUSION
2000.0000 [IU] | Freq: Once | INTRAVENOUS | Status: AC
  Administered 2019-08-06: 2000 [IU] via INTRAVENOUS
  Filled 2019-08-06: qty 2000

## 2019-08-06 MED ORDER — VANCOMYCIN HCL 10 G IV SOLR
1250.0000 mg | Freq: Two times a day (BID) | INTRAVENOUS | Status: DC
  Administered 2019-08-06 – 2019-08-07 (×2): 1250 mg via INTRAVENOUS
  Filled 2019-08-06 (×4): qty 1250

## 2019-08-06 MED ORDER — CHLORHEXIDINE GLUCONATE 0.12% ORAL RINSE (MEDLINE KIT)
15.0000 mL | Freq: Two times a day (BID) | OROMUCOSAL | Status: DC
  Administered 2019-08-06 – 2019-08-09 (×7): 15 mL via OROMUCOSAL

## 2019-08-06 MED ORDER — INSULIN ASPART 100 UNIT/ML ~~LOC~~ SOLN
3.0000 [IU] | SUBCUTANEOUS | Status: DC
  Administered 2019-08-06: 16:00:00 9 [IU] via SUBCUTANEOUS
  Administered 2019-08-06: 6 [IU] via SUBCUTANEOUS
  Administered 2019-08-07: 9 [IU] via SUBCUTANEOUS

## 2019-08-06 MED ORDER — MIDAZOLAM BOLUS VIA INFUSION
1.0000 mg | INTRAVENOUS | Status: DC | PRN
  Filled 2019-08-06: qty 2

## 2019-08-06 MED ORDER — INSULIN DETEMIR 100 UNIT/ML ~~LOC~~ SOLN
24.0000 [IU] | Freq: Every day | SUBCUTANEOUS | Status: DC
  Administered 2019-08-06 – 2019-08-07 (×2): 24 [IU] via SUBCUTANEOUS
  Filled 2019-08-06 (×2): qty 0.24

## 2019-08-06 MED ORDER — ACETAMINOPHEN 160 MG/5ML PO SOLN
650.0000 mg | Freq: Four times a day (QID) | ORAL | Status: DC | PRN
  Administered 2019-08-06 – 2019-09-10 (×44): 650 mg
  Filled 2019-08-06 (×41): qty 20.3

## 2019-08-06 MED ORDER — FENTANYL CITRATE (PF) 100 MCG/2ML IJ SOLN: 25.0000 ug | Freq: Once | INTRAMUSCULAR | Status: DC

## 2019-08-06 MED ORDER — PREGABALIN 50 MG PO CAPS
100.0000 mg | ORAL_CAPSULE | Freq: Three times a day (TID) | ORAL | Status: DC
  Administered 2019-08-06 – 2019-08-09 (×9): 100 mg via NASOGASTRIC
  Filled 2019-08-06 (×8): qty 2

## 2019-08-06 MED ORDER — VANCOMYCIN HCL 10 G IV SOLR
2000.0000 mg | Freq: Once | INTRAVENOUS | Status: AC
  Administered 2019-08-06: 2000 mg via INTRAVENOUS
  Filled 2019-08-06: qty 2000

## 2019-08-06 NOTE — Progress Notes (Addendum)
Inpatient Diabetes Program Recommendations  AACE/ADA: New Consensus Statement on Inpatient Glycemic Control (2015)  Target Ranges:  Prepandial:   less than 140 mg/dL      Peak postprandial:   less than 180 mg/dL (1-2 hours)      Critically ill patients:  140 - 180 mg/dL   Lab Results  Component Value Date   GLUCAP 164 (H) 08/06/2019   HGBA1C 8.2 (H) 08/05/2019    Review of Glycemic Control  COVID + Diabetes history: DM 2 Outpatient Diabetes medications: Humulin R U-500 0-125 Units into the skin 4 (four) times dailySSI  Current orders for Inpatient glycemic control:  IV insulin Endotool/DKA protocol  Decadron 6 mg Q 24 hours A1c 8.2%  BUN/Creat:  27/0.78 Trickle tube feeds to a goal rate 40 ml/hour this am  Endotool IV insulin at least 5 units/hour  Inpatient Diabetes Program Recommendations:    Glucose 282 on presentation. Decadron 10 and 6 mg given yesterday and overnight. Now on IV insulin in DKA. Gtt rates coming down. BMETs look good this am.  Glucose in the mid 100 range. Gtt rates 5 units/hour. Tube feeds to increase.   Utilize Endotool for transition when appropriate (calculates basal doses based on gtt rates and trends).  Order set "Hyperglycemic Crisis: Transition off IV insulin"   Thanks,  Tama Headings RN, MSN, BC-ADM Inpatient Diabetes Coordinator Team Pager [phone] (8a-5p)

## 2019-08-06 NOTE — Progress Notes (Signed)
Wife updated over phone by RN and Dr Wonda Amis. She is requesting to be updated several times a day. Will pass on.

## 2019-08-06 NOTE — Progress Notes (Signed)
Pharmacy Antibiotic Note  [name] is a 68 y.o. male admitted on 07/26/2019 with pneumonia, Covid + on 11/11, intubated in ED.  Pharmacy has been consulted for Vancomycin dosing for possible bacteremia until cx results.   SCr stable. Received one time dose last night. Tmax of 100.2, WBC 11.2  Plan: Start vancomycin 1,250mg  IV Q12h Continue ceftriaxone 2g IV Q24h Continue azithromycin 500mg  IV Q24h Monitor clinical picture, renal function, vanc levels prn F/U C&S, abx deescalation / LOT  Height: 6\' 1"  (185.4 cm) Weight: 248 lb 0.3 oz (112.5 kg) IBW/kg (Calculated) : 79.9  Temp (24hrs), Avg:98.5 F (36.9 C), Min:96.8 F (36 C), Max:100.2 F (37.9 C)  Recent Labs  Lab 07/31/19 1942 07/27/2019 1732 07/25/2019 1953  08/05/19 0804 08/05/19 1234 08/05/19 1623 08/06/19 0053 08/06/19 0432 08/06/19 0832  WBC 7.6 13.6*  --   --  15.4*  --   --   --  11.2*  --   CREATININE 0.95 0.91  --    < > 1.64* 1.59* 1.37* 0.99 0.78 0.64  LATICACIDVEN 1.9 2.1* 1.7  --  2.3* 2.6*  --   --   --   --    < > = values in this interval not displayed.    Estimated Creatinine Clearance: 119.4 mL/min (by C-G formula based on SCr of 0.64 mg/dL).    Allergies  Allergen Reactions  . Bactrim [Sulfamethoxazole-Trimethoprim] Other (See Comments)    Blisters in his mouth  . Budesonide-Formoterol Fumarate Other (See Comments)    Hyperglycemia  Hyperglycemia   . Advair Diskus [Fluticasone-Salmeterol] Other (See Comments)    hyperglycemia  . Hydromorphone Hives, Itching and Nausea And Vomiting  . Pulmicort [Budesonide] Other (See Comments)    Hyperglycemia   . Morphine Itching  . Soap Itching and Other (See Comments)    Other Reaction: ivory soap=itching    Antimicrobials this admission: 11/19 Ceftriaxone >>  11/19 Azithromycin >>  11/20 Vancomycin >>  Dose adjustments this admission:  Microbiology results: 11/19 BCx: 1/3 GPC 11/10 UCx: insignificant growth  11/11 Covid +  Thank you  for allowing pharmacy to be a part of this patient's care.  Elenor Quinones, PharmD, BCPS, BCIDP Clinical Pharmacist 08/06/2019 11:07 AM
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
## 2019-08-06 NOTE — Progress Notes (Signed)
Brief Pharmacy Note:  39 y/oM with COVID-19 on IV heparin infusion for a-fib.     First heparin level < 0.1 units/mL, subtherapeutic  CBC: Hgb 11.2, Pltc WNL  238mL of coffee ground material out of NGT since intubation (noted ~ 1600). Per discussion with RN, residuals bilious/green in color now.   No infusion issues noted per nursing.   Plan: Heparin 3000 units IV bolus x 1, then increase heparin infusion to 2050 units/hr Heparin level 6 hours after rate change Daily CBC, heparin level Monitor closely for s/sx of bleeding   Lindell Spar, PharmD, BCPS Clinical Pharmacist  08/06/2019 3:22 AM
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
## 2019-08-06 NOTE — Progress Notes (Addendum)
PCCM progress note:  Patient examined on arrival to [facility] Resting comfortably with stable vitals  Blood pressure (!) 141/81, pulse (!) 128, temperature (!) 100.8 F (38.2 C), temperature source Axillary, resp. rate (!) 22, height 6\' 1"  (1.854 m), weight 112.5 kg, SpO2 98 %. Gen:      No acute distress HEENT:  EOMI, sclera anicteric Neck:     No masses; no thyromegaly, ETT Lungs:    Clear to auscultation bilaterally; normal respiratory effort CV:         Regular rate and rhythm; no murmurs Abd:      + bowel sounds; soft, non-tender; no palpable masses, no distension Ext:    No edema; adequate peripheral perfusion Skin:      Warm and dry; no rash Neuro: Sedated, unresponsive  ABG 7.33/43/74/92% on 50% FiO2  Assessment/Plan ARDS in setting of COVID-19 pneumonia  We will hold off proning for tonight as P/F ratio is about 150 Wean off propofol as triglycerides are high Use fentanyl and Versed as needed for sedation Continue antibiotics, heparin drip Off insulin drip. Continue levemir, SSI  Wife called and updated by me  The patient is critically ill with multiple organ system failure and requires high complexity decision making for assessment and support, frequent evaluation and titration of therapies, advanced monitoring, review of radiographic studies and interpretation of complex data.   Critical Care Time devoted to patient care services, exclusive of separately billable procedures, described in this note is 35 minutes.   Marshell Garfinkel MD [place] [facility] Pager [phone] If no answer call [phone] 08/06/2019, 5:42 PM

## 2019-08-06 NOTE — Progress Notes (Addendum)
Patient to transfer to [facility] ICU 9202 report given to receiving nurse Margaretha Sheffield, all questions answered at this time.  Transport contacted,  Attempted to contact wife x2 of transfer and new room number.

## 2019-08-06 NOTE — Progress Notes (Addendum)
Pt became tachycardic and hypertensive despite versed, upon stopping propofol. As order is not D/C d, will resume at a lower dose. Dr Wonda Amis paged.

## 2019-08-06 NOTE — Progress Notes (Signed)
Pt arrived to room 9202-1 via [facility].

## 2019-08-06 NOTE — Progress Notes (Signed)
NAME:  [name], MRN:  [id number], DOB:  05/06/1951, LOS: 2 ADMISSION DATE:  07/17/2019, CONSULTATION DATE:  08/05/19 CHIEF COMPLAINT:  Respiratory distress  Brief History   68 y/o M with hx of COPD, DM, HTN, PVD sp BKA, BPH, chronic pain syndrome, COPD, HTN, and HLD who presented 11/19 with chief complaint of increased WOB & confusion. He was found to be COVID positive on 11/11.  His wife is a Occupational psychologist, reportedly is positive as well. The patient was febrile to 102.1 at home and treated with tylenol.  In ER, became increasingly tachypneic & hypoxic requiring intubation. Additional metabolic acidosis with DKA, AKI, AF.    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, difficult to control PVD sp BKA BPH Chronic pain syndrome - back pain, kidney stones Frequent Kidney Stones - multiple over the years, pain "debilitating" per wife  COPD HTN [facility] Events   11/19 Presented to [facility] ER with SOB and confusion, known COVID + 11/20 Worsening hypoxia, confusion, intubated per anesthesia in ER , proned in ICU   Consults:  PCCM  Procedures:  ETT 11/20 >> LUE PICC 11/20 >>   Significant Diagnostic Tests:  CXR 11/20 >> dense R>L infiltrates  Micro Data:  COVID 11/11 >> positive  BCx2 11/19 >> 1/2 GPC >> UC 11/19 >> ng MRSA pcr neg  Antimicrobials:  Vanc 11/20 >> 11/20 Ceftriaxone 11/20 >> Azithromycin 11/20 >>  Interim history/subjective:   Low-grade febrile Sedated on fentanyl and propofol Flipped on his back at 8 AM this morning  Objective   Blood pressure 138/69, pulse (!) 112, temperature 99.3 F (37.4 C), temperature source Bladder, resp. rate (!) 26, height 6\' 1"  (1.854 m), weight 112.5 kg, SpO2 95 %. CVP:  [9 mmHg-17 mmHg] 10 mmHg  Vent Mode: PRVC FiO2 (%):  [50 %-70 %] 50 % Set Rate:  [26 bmp] 26 bmp Vt Set:  [560 mL] 560 mL PEEP:  [10 cmH20] 10 cmH20 Plateau Pressure:  [24 cmH20-26 cmH20] 24 cmH20   Intake/Output  Summary (Last 24 hours) at 08/06/2019 0844 Last data filed at 08/06/2019 0700 Gross per 24 hour  Intake 6021.57 ml  Output 2080 ml  Net 3941.57 ml   Filed Weights   08/12/2019 2130 08/05/19 1000 08/06/19 0500  Weight: 104.8 kg 111.6 kg 112.5 kg    Examination:  General: chronically ill appearing adult male lying in bed sedated on vent HEENT: MM pink/moist, ETT Neuro: RASS is -5 on fentanyl/propofol, appears comfortable  CV: AF on monitor  PULM:  Non-labored, appears synchronous on vent, no accessory muscle use , peak pressure 25 GI: soft, bsx4 active  Extremities: RLE amputation  Skin: no rashes or lesions  Chest x-ray 11/21 personally reviewed ET tube in position, bibasilar infiltrates unchanged  Labs show mild hypokalemia, stable leukocytosis and anemia, normal renal function  Resolved Hospital Problem list      Assessment & Plan:    ARDS secondary to COVID PNA  - P/F ratio compatible with moderate ARDS. -Low tidal volume ventilation at 7cc/kg -wean PEEP / FiO2 per ARDS protocol   -maintain driving pressure less than 15, plateau less than 30 -Prone position into 16 hours alternating with supine x8 hours, will recheck ABG at 3 PM and decide if he needs to be prone again -continue decadron + remdesivir  -lovenox for DVT prophylaxis  -VAP prevention measures  -Lasix as blood pressure permits  Bacterial pneumonia superimposed-GPC in blood,  likely contaminant, but will treat with vancomycin meantime  Acute metabolic encephalopathy Chronic pain -RASS GOAL -5 while being proned -Propofol plus fentanyl -Resume home oxycodone and Lyrica hopefully can decrease fentanyl needs   DKA DM -insulin requiring -On steroids -transition off insulin drip to ICU hyperglycemia protocol with resistant scale -Levemir 24 units as suggested by Endo tool -consider linagliptin 11/21 if off insulin gtt   Atrial Fibrillation  -heparin gtt per pharmacy  -tele monitor  -PRN lopressor for  HR>130, SBP >160  Hx HTN, HLD -hold home cozaar  COPD On home O2.   Without acute exacerbation, no bronchospasm noted on admit exam.  PFT's 04/2018 FVC: 3.63 L (74 %pred), FEV1: 2.67 L (69 %pred), FEV1/FVC: 74, TLC: 5.96 L (82 %pred), DLCO 81 %pred. No significant BD response.  -continue in line duoneb  BPH -hold home flomax  -keep foley catheter   Best practice:  Diet: NPO, TF   Pain/Anxiety/Delirium protocol (if indicated): Fentanyl and propofol VAP protocol (if indicated): ordered DVT prophylaxis: heparin gtt  GI prophylaxis: PPI Glucose control: Hyperglycemia protocol  Mobility: BR, prone positioning  Code Status: Full  Family Communication: Wife- all measures  - to include trach if necessary.  Reports she does not want his beard shaved for trach.  Updated 11/21, she is now agreeable to transfer to [facility] Disposition: ICU >> [facility]   The patient is critically ill with multiple organ systems failure and requires high complexity decision making for assessment and support, frequent evaluation and titration of therapies, application of advanced monitoring technologies and extensive interpretation of multiple databases. Critical Care Time devoted to patient care services described in this note independent of APP/resident  time is 35 minutes.   Kara Mead MD. [facility] Flood. [facility]  If no response to pager , please call [phone]      08/06/2019 8:44 AM

## 2019-08-06 NOTE — Progress Notes (Signed)
[facility] Progress Note Patient Name: [name] DOB: [birth year] MRN: [id number]   Date of Service  08/06/2019  HPI/Events of Note  Fever to 102.9 F - Already on multiple antibiotics. Request for Tylenol. AST and ALT both normal.   eICU Interventions  Will order: 1. Tylenol Suspension 650 mg per tube Q 6 hours PRN Temp > 101.0 F.     Intervention Category Major Interventions: Other:  Lysle Dingwall 08/06/2019, 10:18 PM

## 2019-08-07 ENCOUNTER — Inpatient Hospital Stay (HOSPITAL_COMMUNITY): Payer: Medicare Other

## 2019-08-07 DIAGNOSIS — J1289 Other viral pneumonia: Secondary | ICD-10-CM | POA: Diagnosis not present

## 2019-08-07 DIAGNOSIS — U071 COVID-19: Secondary | ICD-10-CM | POA: Diagnosis not present

## 2019-08-07 LAB — COMPREHENSIVE METABOLIC PANEL
ALT: 42 U/L (ref 0–44)
AST: 71 U/L — ABNORMAL HIGH (ref 15–41)
Albumin: 2 g/dL — ABNORMAL LOW (ref 3.5–5.0)
Alkaline Phosphatase: 144 U/L — ABNORMAL HIGH (ref 38–126)
Anion gap: 8 (ref 5–15)
BUN: 34 mg/dL — ABNORMAL HIGH (ref 8–23)
CO2: 22 mmol/L (ref 22–32)
Calcium: 9.3 mg/dL (ref 8.9–10.3)
Chloride: 112 mmol/L — ABNORMAL HIGH (ref 98–111)
Creatinine, Ser: 0.9 mg/dL (ref 0.61–1.24)
GFR calc Af Amer: >60 mL/min (ref >60)
GFR calc non Af Amer: >60 mL/min (ref >60)
Glucose, Bld: 146 mg/dL — ABNORMAL HIGH (ref 70–99)
Potassium: 4.3 mmol/L (ref 3.5–5.1)
Sodium: 142 mmol/L (ref 135–145)
Total Bilirubin: 0.9 mg/dL (ref 0.3–1.2)
Total Protein: 5.7 g/dL — ABNORMAL LOW (ref 6.5–8.1)

## 2019-08-07 LAB — CBC WITH DIFFERENTIAL/PLATELET
Abs Immature Granulocytes: 0.13 10*3/uL — ABNORMAL HIGH (ref 0.00–0.07)
Basophils Absolute: 0 10*3/uL (ref 0.0–0.1)
Basophils Relative: 0 %
Eosinophils Absolute: 0 10*3/uL (ref 0.0–0.5)
Eosinophils Relative: 0 %
HCT: 31.2 % — ABNORMAL LOW (ref 39.0–52.0)
Hemoglobin: 10.3 g/dL — ABNORMAL LOW (ref 13.0–17.0)
Immature Granulocytes: 1 %
Lymphocytes Relative: 14 %
Lymphs Abs: 1.3 10*3/uL (ref 0.7–4.0)
MCH: 34.2 pg — ABNORMAL HIGH (ref 26.0–34.0)
MCHC: 33 g/dL (ref 30.0–36.0)
MCV: 103.7 fL — ABNORMAL HIGH (ref 80.0–100.0)
Monocytes Absolute: 0.9 10*3/uL (ref 0.1–1.0)
Monocytes Relative: 10 %
Neutro Abs: 7.1 10*3/uL (ref 1.7–7.7)
Neutrophils Relative %: 75 %
Platelets: 405 10*3/uL — ABNORMAL HIGH (ref 150–400)
RBC: 3.01 MIL/uL — ABNORMAL LOW (ref 4.22–5.81)
RDW: 17.2 % — ABNORMAL HIGH (ref 11.5–15.5)
WBC: 9.5 10*3/uL (ref 4.0–10.5)
nRBC: 0.3 % — ABNORMAL HIGH (ref 0.0–0.2)

## 2019-08-07 LAB — POCT I-STAT 7, (LYTES, BLD GAS, ICA,H+H)
Bicarbonate: 24.7 mmol/L (ref 20.0–28.0)
Calcium, Ion: 1.41 mmol/L — ABNORMAL HIGH (ref 1.15–1.40)
HCT: 32 % — ABNORMAL LOW (ref 39.0–52.0)
Hemoglobin: 10.9 g/dL — ABNORMAL LOW (ref 13.0–17.0)
O2 Saturation: 96 %
Patient temperature: 38.6
Potassium: 4.4 mmol/L (ref 3.5–5.1)
Sodium: 142 mmol/L (ref 135–145)
TCO2: 26 mmol/L (ref 22–32)
pCO2 arterial: 40.6 mmHg (ref 32.0–48.0)
pH, Arterial: 7.399 (ref 7.350–7.450)
pO2, Arterial: 90 mmHg (ref 83.0–108.0)

## 2019-08-07 LAB — BASIC METABOLIC PANEL
Anion gap: 7 (ref 5–15)
BUN: 36 mg/dL — ABNORMAL HIGH (ref 8–23)
CO2: 27 mmol/L (ref 22–32)
Calcium: 9.3 mg/dL (ref 8.9–10.3)
Chloride: 109 mmol/L (ref 98–111)
Creatinine, Ser: 0.87 mg/dL (ref 0.61–1.24)
GFR calc Af Amer: >60 mL/min (ref >60)
GFR calc non Af Amer: >60 mL/min (ref >60)
Glucose, Bld: 348 mg/dL — ABNORMAL HIGH (ref 70–99)
Potassium: 4 mmol/L (ref 3.5–5.1)
Sodium: 143 mmol/L (ref 135–145)

## 2019-08-07 LAB — TRIGLYCERIDES: Triglycerides: 69 mg/dL (ref <150)

## 2019-08-07 LAB — HEPARIN LEVEL (UNFRACTIONATED)
Heparin Unfractionated: 0.19 IU/mL — ABNORMAL LOW (ref 0.30–0.70)
Heparin Unfractionated: 0.47 IU/mL (ref 0.30–0.70)

## 2019-08-07 LAB — MAGNESIUM: Magnesium: 2.1 mg/dL (ref 1.7–2.4)

## 2019-08-07 LAB — GLUCOSE, CAPILLARY
Glucose-Capillary: 153 mg/dL — ABNORMAL HIGH (ref 70–99)
Glucose-Capillary: 192 mg/dL — ABNORMAL HIGH (ref 70–99)
Glucose-Capillary: 317 mg/dL — ABNORMAL HIGH (ref 70–99)
Glucose-Capillary: 323 mg/dL — ABNORMAL HIGH (ref 70–99)
Glucose-Capillary: 359 mg/dL — ABNORMAL HIGH (ref 70–99)

## 2019-08-07 LAB — PHOSPHORUS: Phosphorus: 2 mg/dL — ABNORMAL LOW (ref 2.5–4.6)

## 2019-08-07 LAB — D-DIMER, QUANTITATIVE: D-Dimer, Quant: 1.67 ug/mL-FEU — ABNORMAL HIGH (ref 0.00–0.50)

## 2019-08-07 MED ORDER — ENOXAPARIN SODIUM 120 MG/0.8ML ~~LOC~~ SOLN
115.0000 mg | Freq: Once | SUBCUTANEOUS | Status: AC
  Administered 2019-08-07: 115 mg via SUBCUTANEOUS
  Filled 2019-08-07: qty 0.8

## 2019-08-07 MED ORDER — POTASSIUM & SODIUM PHOSPHATES 280-160-250 MG PO PACK
1.0000 | PACK | Freq: Four times a day (QID) | ORAL | Status: AC
  Administered 2019-08-07 (×4): 1
  Filled 2019-08-07 (×5): qty 1

## 2019-08-07 MED ORDER — LINAGLIPTIN 5 MG PO TABS
5.0000 mg | ORAL_TABLET | Freq: Every day | ORAL | Status: DC
  Administered 2019-08-07 – 2019-08-12 (×6): 5 mg via ORAL
  Filled 2019-08-07 (×6): qty 1

## 2019-08-07 MED ORDER — METOPROLOL TARTRATE 25 MG PO TABS
25.0000 mg | ORAL_TABLET | Freq: Two times a day (BID) | ORAL | Status: DC
  Administered 2019-08-07 – 2019-08-08 (×3): 25 mg
  Filled 2019-08-07 (×3): qty 1

## 2019-08-07 MED ORDER — DILTIAZEM LOAD VIA INFUSION
10.0000 mg | Freq: Once | INTRAVENOUS | Status: AC
  Administered 2019-08-07: 10 mg via INTRAVENOUS
  Filled 2019-08-07: qty 10

## 2019-08-07 MED ORDER — INSULIN DETEMIR 100 UNIT/ML ~~LOC~~ SOLN
25.0000 [IU] | Freq: Two times a day (BID) | SUBCUTANEOUS | Status: DC
  Administered 2019-08-07 – 2019-08-08 (×2): 25 [IU] via SUBCUTANEOUS
  Filled 2019-08-07 (×3): qty 0.25

## 2019-08-07 MED ORDER — LORAZEPAM 2 MG/ML IJ SOLN
INTRAMUSCULAR | Status: AC
  Filled 2019-08-07: qty 1

## 2019-08-07 MED ORDER — INSULIN DETEMIR 100 UNIT/ML ~~LOC~~ SOLN
25.0000 [IU] | Freq: Two times a day (BID) | SUBCUTANEOUS | Status: DC
  Filled 2019-08-07: qty 0.25

## 2019-08-07 MED ORDER — HEPARIN BOLUS VIA INFUSION
1500.0000 [IU] | Freq: Once | INTRAVENOUS | Status: AC
  Administered 2019-08-07: 05:00:00 1500 [IU] via INTRAVENOUS
  Filled 2019-08-07: qty 1500

## 2019-08-07 MED ORDER — ENOXAPARIN SODIUM 120 MG/0.8ML ~~LOC~~ SOLN
115.0000 mg | Freq: Two times a day (BID) | SUBCUTANEOUS | Status: DC
  Administered 2019-08-07 – 2019-08-10 (×5): 115 mg via SUBCUTANEOUS
  Filled 2019-08-07 (×8): qty 0.8

## 2019-08-07 MED ORDER — INSULIN ASPART 100 UNIT/ML ~~LOC~~ SOLN
0.0000 [IU] | SUBCUTANEOUS | Status: DC
  Administered 2019-08-07: 15 [IU] via SUBCUTANEOUS
  Administered 2019-08-07: 20 [IU] via SUBCUTANEOUS
  Administered 2019-08-07: 15 [IU] via SUBCUTANEOUS
  Administered 2019-08-07: 11 [IU] via SUBCUTANEOUS
  Administered 2019-08-08: 7 [IU] via SUBCUTANEOUS
  Administered 2019-08-08: 11 [IU] via SUBCUTANEOUS
  Administered 2019-08-08: 15 [IU] via SUBCUTANEOUS

## 2019-08-07 MED ORDER — K PHOS MONO-SOD PHOS DI & MONO 155-852-130 MG PO TABS
500.0000 mg | ORAL_TABLET | Freq: Four times a day (QID) | ORAL | Status: DC
  Filled 2019-08-07 (×4): qty 2

## 2019-08-07 MED ORDER — DILTIAZEM HCL-DEXTROSE 125-5 MG/125ML-% IV SOLN (PREMIX)
5.0000 mg/h | INTRAVENOUS | Status: DC
  Administered 2019-08-07: 5 mg/h via INTRAVENOUS
  Administered 2019-08-08 – 2019-08-10 (×6): 15 mg/h via INTRAVENOUS
  Filled 2019-08-07 (×8): qty 125

## 2019-08-07 MED ORDER — FUROSEMIDE 10 MG/ML IJ SOLN
40.0000 mg | Freq: Once | INTRAMUSCULAR | Status: AC
  Administered 2019-08-07: 10:00:00 40 mg via INTRAVENOUS
  Filled 2019-08-07: qty 4

## 2019-08-07 NOTE — Progress Notes (Signed)
Wife and daughter updated separately via phone this evening.  All questions and concerns addressed.
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
## 2019-08-07 NOTE — Progress Notes (Signed)
201 mL IV Fentanyl wasted with Denyse Amass, RN

## 2019-08-07 NOTE — Progress Notes (Signed)
Assisted tele visit to patient with wife.  [name], [name], RN

## 2019-08-07 NOTE — Progress Notes (Signed)
Assisted tele visit to patient with family member.  Javon Snee M, RN  

## 2019-08-07 NOTE — Progress Notes (Signed)
Insulin gtt started per order.

## 2019-08-07 NOTE — Progress Notes (Signed)
ANTICOAGULATION CONSULT NOTE - Follow Up Consult  Pharmacy Consult for Heparin Indication: atrial fibrillation  Allergies  Allergen Reactions  . Bactrim [Sulfamethoxazole-Trimethoprim] Other (See Comments)    Blisters in his mouth  . Budesonide-Formoterol Fumarate Other (See Comments)    Hyperglycemia  Hyperglycemia   . Advair Diskus [Fluticasone-Salmeterol] Other (See Comments)    hyperglycemia  . Hydromorphone Hives, Itching and Nausea And Vomiting  . Pulmicort [Budesonide] Other (See Comments)    Hyperglycemia   . Morphine Itching  . Soap Itching and Other (See Comments)    Other Reaction: ivory soap=itching     Patient Measurements: Height: 6\' 1"  (185.4 cm) Weight: 250 lb 3.6 oz (113.5 kg) IBW/kg (Calculated) : 79.9 Heparin Dosing Weight: 103 kg  Vital Signs: Temp: 101.8 F (38.8 C) (11/22 0400) Temp Source: Bladder (11/22 0400) BP: 121/57 (11/22 0400) Pulse Rate: 108 (11/22 0400)  Labs: Recent Labs    08/13/2019 1732  08/05/19 0804  08/06/19 0053  08/06/19 0432 08/06/19 0832 08/06/19 0950 08/06/19 1546 08/06/19 1729 08/07/19 0359  HGB 12.6*  --  11.2*  --   --   --  10.7*  --   --  10.9*  --   --   HCT 36.1*  --  34.8*  --   --   --  31.5*  --   --  32.0*  --   --   PLT 389  --  400  --   --   --  389  --   --   --   --   --   APTT 37*  --   --   --   --   --   --   --   --   --   --   --   LABPROT 15.1  --   --   --   --   --   --   --   --   --   --   --   INR 1.2  --   --   --   --   --   --   --   --   --   --   --   HEPARINUNFRC  --   --   --   --   --    < >  --   --  0.16*  --  <0.10* 0.19*  CREATININE 0.91   < > 1.64*   < > 0.99  --  0.78 0.64  --   --   --   --    < > = values in this interval not displayed.    Estimated Creatinine Clearance: 119.9 mL/min (by C-G formula based on SCr of 0.64 mg/dL).    Assessment: 68 y/o M with hx of COPD, DM, HTN, PVD sp BKA, BPH, chronic pain syndrome, COPD, HTN, and HLD who presented 11/19 with chief  complaint of increased WOB & confusion. He was found to be COVID positive on 11/11,  Pharmacy consulted to dose heparin for AFIB.  CBC WNL, INR 1.2  08/07/19 0500 UPDATE HL= 0.19 IU/mL, below therapeutic goal range Infusion rate is correct through central IV line, no signs/symproms of bleeding per RN   CBC pending   Goal of Therapy:  Heparin level 0.3-0.7 units/ml Monitor platelets by anticoagulation protocol:   Plan:  Heparin 1,500 unit bolus Increase heparin infusion  to 2,800 units/hr Consider checking anti-thrombin III levels Check ~6-8 hr heparin level Monitor daily heparin  level, CBC, s/s of bleed  Despina Pole, Pharm. D. Clinical Pharmacist 08/07/2019 5:07 AM

## 2019-08-07 NOTE — Progress Notes (Signed)
Spouse, [name], updated via telephone.

## 2019-08-07 NOTE — Progress Notes (Signed)
Inpatient Diabetes Program Recommendations  AACE/ADA: New Consensus Statement on Inpatient Glycemic Control (2015)  Target Ranges:  Prepandial:   less than 140 mg/dL      Peak postprandial:   less than 180 mg/dL (1-2 hours)      Critically ill patients:  140 - 180 mg/dL   Lab Results  Component Value Date   GLUCAP 153 (H) 08/07/2019   HGBA1C 8.2 (H) 08/05/2019    Review of Glycemic Control  Diabetes history: DM 2 Outpatient Diabetes medications: Concentrated humulin R U-500 insulin on a SSI 0-125 units 4x/day Current orders for Inpatient glycemic control:  Levemir 24 units Daily Tradjenta 5 mg Daily Novolog ICU resistant scale 3-9 units Q4  Decadron 6 mg Q24 hours Tube Feeds Vital HP 20 ml/hour BUN/Creat: 34/0.90  Inpatient Diabetes Program Recommendations:     May need bid dosing of Levemir  Consider Novolog 6 units Q4 hours Tube Feed Coverage. (do not give if tube feeds are stopped or held)  Thanks,  Tama Headings RN, MSN, BC-ADM Inpatient Diabetes Coordinator Team Pager [phone] (8a-5p)

## 2019-08-07 NOTE — Progress Notes (Signed)
Oxy IR 10mg  wasted per protocol with Fredric Mare RN.

## 2019-08-07 NOTE — Progress Notes (Signed)
ANTICOAGULATION CONSULT NOTE - Follow Up Consult  Pharmacy Consult for Enoxaparin Indication: atrial fibrillation  Allergies  Allergen Reactions  . Bactrim [Sulfamethoxazole-Trimethoprim] Other (See Comments)    Blisters in his mouth  . Budesonide-Formoterol Fumarate Other (See Comments)    Hyperglycemia  Hyperglycemia   . Advair Diskus [Fluticasone-Salmeterol] Other (See Comments)    hyperglycemia  . Hydromorphone Hives, Itching and Nausea And Vomiting  . Pulmicort [Budesonide] Other (See Comments)    Hyperglycemia   . Morphine Itching  . Soap Itching and Other (See Comments)    Other Reaction: ivory soap=itching     Patient Measurements: Height: 6\' 1"  (185.4 cm) Weight: 250 lb 3.6 oz (113.5 kg) IBW/kg (Calculated) : 79.9 Heparin Dosing Weight: 103 kg  Vital Signs: Temp: 101.5 F (38.6 C) (11/22 0700) Temp Source: Bladder (11/22 0400) BP: 159/73 (11/22 0700) Pulse Rate: 121 (11/22 0700)  Labs: Recent Labs    08/09/2019 1732  08/05/19 0804  08/06/19 0432 08/06/19 0832 08/06/19 0950 08/06/19 1546 08/06/19 1729 08/07/19 0359 08/07/19 0433  HGB 12.6*  --  11.2*  --  10.7*  --   --  10.9*  --   --  10.3*  HCT 36.1*  --  34.8*  --  31.5*  --   --  32.0*  --   --  31.2*  PLT 389  --  400  --  389  --   --   --   --   --  405*  APTT 37*  --   --   --   --   --   --   --   --   --   --   LABPROT 15.1  --   --   --   --   --   --   --   --   --   --   INR 1.2  --   --   --   --   --   --   --   --   --   --   HEPARINUNFRC  --   --   --    < >  --   --  0.16*  --  <0.10* 0.19*  --   CREATININE 0.91   < > 1.64*   < > 0.78 0.64  --   --   --   --  0.90   < > = values in this interval not displayed.    Estimated Creatinine Clearance: 106.5 mL/min (by C-G formula based on SCr of 0.9 mg/dL).   Medications:  Infusions:  . sodium chloride 1,000 mL (08/09/2019 1744)  . azithromycin Stopped (08/06/19 2212)  . cefTRIAXone (ROCEPHIN)  IV 200 mL/hr at 08/06/19 2000  . feeding  supplement (VITAL HIGH PROTEIN) 20 mL/hr at 08/07/19 0705  . fentaNYL infusion INTRAVENOUS    . heparin 2,800 Units/hr (08/07/19 0705)  . insulin Stopped (08/07/19 0436)  . midazolam 2 mg/hr (08/07/19 0705)  . propofol (DIPRIVAN) infusion 25 mcg/kg/min (08/06/19 2000)  . remdesivir 100 mg in NS 250 mL Stopped (08/06/19 2338)  . vancomycin 1,250 mg (08/07/19 0530)    Assessment: 68 y/o Mwith hxof COPD, DM, HTN, PVD sp BKA, BPH, chronic pain syndrome, COPD, HTN, and HLD who presented 11/19 withchief complaint of increased WOB& confusion.He was found to beCOVID positive on 11/11,  Pharmacy consulted to dose heparin for Afib, now transitioning to Lovenox.  Heparin level in process SCr 0.9, CrCl > 100 ml/min CBC: Hgb  down to 10.3 (Baseline Hgb ~12), Plt inc to 405k No bleeding or complications reported.  Goal of Therapy:  Heparin level 0.3-0.7 units/ml Monitor platelets by anticoagulation protocol: Yes   Plan:  D/c heparin infusion Lovenox 1 mg/kg (115 mg) [place] q12h Monitor renal function, CBC, s/s bleeding.  Gretta Arab PharmD, BCPS Clinical pharmacist phone 7am- 5pm: [phone] 08/07/2019 7:15 AM

## 2019-08-07 NOTE — Progress Notes (Signed)
Spouse, [name], updated again via telephone.

## 2019-08-08 ENCOUNTER — Inpatient Hospital Stay (HOSPITAL_COMMUNITY): Payer: Medicare Other

## 2019-08-08 DIAGNOSIS — I739 Peripheral vascular disease, unspecified: Secondary | ICD-10-CM

## 2019-08-08 DIAGNOSIS — R209 Unspecified disturbances of skin sensation: Secondary | ICD-10-CM

## 2019-08-08 DIAGNOSIS — J449 Chronic obstructive pulmonary disease, unspecified: Secondary | ICD-10-CM | POA: Diagnosis not present

## 2019-08-08 DIAGNOSIS — U071 COVID-19: Secondary | ICD-10-CM | POA: Diagnosis not present

## 2019-08-08 DIAGNOSIS — J1289 Other viral pneumonia: Secondary | ICD-10-CM | POA: Diagnosis not present

## 2019-08-08 DIAGNOSIS — J80 Acute respiratory distress syndrome: Secondary | ICD-10-CM | POA: Diagnosis not present

## 2019-08-08 LAB — CBC WITH DIFFERENTIAL/PLATELET
Abs Immature Granulocytes: 0.07 10*3/uL (ref 0.00–0.07)
Basophils Absolute: 0 10*3/uL (ref 0.0–0.1)
Basophils Relative: 0 %
Eosinophils Absolute: 0 10*3/uL (ref 0.0–0.5)
Eosinophils Relative: 0 %
HCT: 32.4 % — ABNORMAL LOW (ref 39.0–52.0)
Hemoglobin: 10.5 g/dL — ABNORMAL LOW (ref 13.0–17.0)
Immature Granulocytes: 1 %
Lymphocytes Relative: 14 %
Lymphs Abs: 1.2 10*3/uL (ref 0.7–4.0)
MCH: 33.9 pg (ref 26.0–34.0)
MCHC: 32.4 g/dL (ref 30.0–36.0)
MCV: 104.5 fL — ABNORMAL HIGH (ref 80.0–100.0)
Monocytes Absolute: 0.8 10*3/uL (ref 0.1–1.0)
Monocytes Relative: 9 %
Neutro Abs: 6.4 10*3/uL (ref 1.7–7.7)
Neutrophils Relative %: 76 %
Platelets: 443 10*3/uL — ABNORMAL HIGH (ref 150–400)
RBC: 3.1 MIL/uL — ABNORMAL LOW (ref 4.22–5.81)
RDW: 17.2 % — ABNORMAL HIGH (ref 11.5–15.5)
WBC: 8.5 10*3/uL (ref 4.0–10.5)
nRBC: 0.2 % (ref 0.0–0.2)

## 2019-08-08 LAB — GLUCOSE, CAPILLARY
Glucose-Capillary: 125 mg/dL — ABNORMAL HIGH (ref 70–99)
Glucose-Capillary: 144 mg/dL — ABNORMAL HIGH (ref 70–99)
Glucose-Capillary: 147 mg/dL — ABNORMAL HIGH (ref 70–99)
Glucose-Capillary: 183 mg/dL — ABNORMAL HIGH (ref 70–99)
Glucose-Capillary: 196 mg/dL — ABNORMAL HIGH (ref 70–99)
Glucose-Capillary: 200 mg/dL — ABNORMAL HIGH (ref 70–99)
Glucose-Capillary: 214 mg/dL — ABNORMAL HIGH (ref 70–99)
Glucose-Capillary: 225 mg/dL — ABNORMAL HIGH (ref 70–99)
Glucose-Capillary: 244 mg/dL — ABNORMAL HIGH (ref 70–99)
Glucose-Capillary: 252 mg/dL — ABNORMAL HIGH (ref 70–99)
Glucose-Capillary: 271 mg/dL — ABNORMAL HIGH (ref 70–99)
Glucose-Capillary: 271 mg/dL — ABNORMAL HIGH (ref 70–99)
Glucose-Capillary: 272 mg/dL — ABNORMAL HIGH (ref 70–99)
Glucose-Capillary: 295 mg/dL — ABNORMAL HIGH (ref 70–99)
Glucose-Capillary: 323 mg/dL — ABNORMAL HIGH (ref 70–99)

## 2019-08-08 LAB — COMPREHENSIVE METABOLIC PANEL
ALT: 38 U/L (ref 0–44)
AST: 36 U/L (ref 15–41)
Albumin: 1.9 g/dL — ABNORMAL LOW (ref 3.5–5.0)
Alkaline Phosphatase: 122 U/L (ref 38–126)
Anion gap: 9 (ref 5–15)
BUN: 40 mg/dL — ABNORMAL HIGH (ref 8–23)
CO2: 29 mmol/L (ref 22–32)
Calcium: 9.4 mg/dL (ref 8.9–10.3)
Chloride: 109 mmol/L (ref 98–111)
Creatinine, Ser: 0.78 mg/dL (ref 0.61–1.24)
GFR calc Af Amer: >60 mL/min (ref >60)
GFR calc non Af Amer: >60 mL/min (ref >60)
Glucose, Bld: 339 mg/dL — ABNORMAL HIGH (ref 70–99)
Potassium: 3.8 mmol/L (ref 3.5–5.1)
Sodium: 147 mmol/L — ABNORMAL HIGH (ref 135–145)
Total Bilirubin: 0.7 mg/dL (ref 0.3–1.2)
Total Protein: 6.1 g/dL — ABNORMAL LOW (ref 6.5–8.1)

## 2019-08-08 LAB — CULTURE, BLOOD (ROUTINE X 2): Special Requests: ADEQUATE

## 2019-08-08 LAB — MAGNESIUM: Magnesium: 2 mg/dL (ref 1.7–2.4)

## 2019-08-08 LAB — PHOSPHORUS: Phosphorus: 2.2 mg/dL — ABNORMAL LOW (ref 2.5–4.6)

## 2019-08-08 LAB — D-DIMER, QUANTITATIVE: D-Dimer, Quant: 1.97 ug/mL-FEU — ABNORMAL HIGH (ref 0.00–0.50)

## 2019-08-08 MED ORDER — DEXTROSE-NACL 5-0.45 % IV SOLN
INTRAVENOUS | Status: DC
  Administered 2019-08-08 – 2019-08-09 (×2): via INTRAVENOUS

## 2019-08-08 MED ORDER — INSULIN ASPART 100 UNIT/ML ~~LOC~~ SOLN
4.0000 [IU] | SUBCUTANEOUS | Status: DC
  Administered 2019-08-08: 4 [IU] via SUBCUTANEOUS

## 2019-08-08 MED ORDER — SODIUM CHLORIDE 0.9 % IV SOLN
INTRAVENOUS | Status: DC
  Administered 2019-08-08: 16:00:00 via INTRAVENOUS

## 2019-08-08 MED ORDER — VITAL HIGH PROTEIN PO LIQD
1000.0000 mL | ORAL | Status: DC
  Administered 2019-08-08 (×2): 1000 mL

## 2019-08-08 MED ORDER — INSULIN REGULAR(HUMAN) IN NACL 100-0.9 UT/100ML-% IV SOLN
INTRAVENOUS | Status: DC
  Administered 2019-08-08: 7.5 [IU]/h via INTRAVENOUS
  Administered 2019-08-08: 14 [IU]/h via INTRAVENOUS
  Administered 2019-08-09: 9 [IU]/h via INTRAVENOUS
  Filled 2019-08-08: qty 200

## 2019-08-08 MED ORDER — METOPROLOL TARTRATE 25 MG PO TABS
50.0000 mg | ORAL_TABLET | Freq: Two times a day (BID) | ORAL | Status: DC
  Administered 2019-08-08 – 2019-08-10 (×5): 50 mg
  Filled 2019-08-08 (×5): qty 2

## 2019-08-08 MED ORDER — MIDAZOLAM HCL 2 MG/2ML IJ SOLN
1.0000 mg | INTRAMUSCULAR | Status: DC | PRN
  Administered 2019-08-13 – 2019-08-17 (×9): 2 mg via INTRAVENOUS
  Filled 2019-08-08 (×11): qty 2

## 2019-08-08 MED ORDER — METOPROLOL TARTRATE 25 MG PO TABS
50.0000 mg | ORAL_TABLET | Freq: Once | ORAL | Status: AC
  Administered 2019-08-08: 16:00:00 50 mg via ORAL
  Filled 2019-08-08: qty 2

## 2019-08-08 MED ORDER — DEXTROSE 50 % IV SOLN: 0.0000 mL | INTRAVENOUS | Status: DC | PRN

## 2019-08-08 MED ORDER — POTASSIUM PHOSPHATE MONOBASIC 500 MG PO TABS
500.0000 mg | ORAL_TABLET | Freq: Three times a day (TID) | ORAL | Status: AC
  Administered 2019-08-08 – 2019-08-09 (×3): 500 mg
  Filled 2019-08-08 (×3): qty 1

## 2019-08-08 NOTE — Progress Notes (Signed)
VASCULAR LAB PRELIMINARY  PRELIMINARY  PRELIMINARY  PRELIMINARY  Left lower extremity arterial duplex completed.    Preliminary report:  See CV proc for preliminary results.   Sarissa Dern, RVT 08/08/2019, 12:39 PM

## 2019-08-08 NOTE — Progress Notes (Signed)
[facility] Progress Note Patient Name: JAMIROQUAI CAVEY III DOB: 13-Dec-1950 MRN: [id number]   Date of Service  08/08/2019  HPI/Events of Note  Notified of patient starting to wake up but preferentially moving left arm. Able to move both legs. Minimal withdrawal to pain on right arm.  eICU Interventions  Patient would not be a candidate for thrombolytics. Would get head CT in am if weakness persistent to rule out bleed as patient on full dose anti-coagulation.     Intervention Category Major Interventions: Other:  Judd Lien 08/08/2019, 5:13 AM

## 2019-08-08 NOTE — Progress Notes (Signed)
Spoke with and updated patients daughter [name].

## 2019-08-08 NOTE — Plan of Care (Signed)

## 2019-08-08 NOTE — Progress Notes (Signed)
Video call facilitated with patient's wife [name] via [facility] nurse.  This nurse present during video call and answered all of wife's questions.

## 2019-08-08 NOTE — Progress Notes (Signed)
Assisted tele visit to patient with wife.  [name], [name], RN

## 2019-08-08 NOTE — Progress Notes (Signed)
During my initial assessment patient not following any commands however moving left arm, left and right leg. Right arm had no movement to painful stimuli. Lowered fentanyl gtt and was able to get patient to withdrawal to pain on the right arm. Dr [place] Bells was made aware. No new orders at this time. Will continue to lower fentanyl gtt.

## 2019-08-08 NOTE — Progress Notes (Signed)
Miranda, daughter, updated via telephone.  All questions and concerns addressed.

## 2019-08-08 NOTE — Progress Notes (Signed)
Inpatient Diabetes Program Recommendations  AACE/ADA: New Consensus Statement on Inpatient Glycemic Control  Target Ranges:  Prepandial:   less than 140 mg/dL      Peak postprandial:   less than 180 mg/dL (1-2 hours)      Critically ill patients:  140 - 180 mg/dL   Results for Mukai, Feras L III [name] (MRN [id number]) as of 08/08/2019 07:25  Ref. Range 08/07/2019 01:23 08/07/2019 12:40 08/07/2019 16:25 08/07/2019 19:38 08/07/2019 23:49  Glucose-Capillary Latest Ref Range: 70 - 99 mg/dL 153 (H) 359 (H) 323 (H) 317 (H) 252 (H)  Results for Grafton, Armas L III [name] (MRN [id number]) as of 08/08/2019 07:25  Ref. Range 08/06/2019 09:57 08/06/2019 11:07 08/06/2019 11:41 08/06/2019 15:41 08/06/2019 19:46 08/06/2019 21:30 08/06/2019 23:51 08/07/2019 01:23 08/07/2019 12:40 08/07/2019 16:25 08/07/2019 19:38 08/07/2019 23:49  Glucose-Capillary Latest Ref Range: 70 - 99 mg/dL 211 (H) 184 (H) 190 (H) 278 (H) 310 (H) 262 (H) 192 (H) 153 (H) 359 (H) 323 (H) 317 (H) 252 (H)   Review of Glycemic Control  Diabetes history: DM2 Outpatient Diabetes medications: Humulin R U500 0-125 units QID (per Endo note on 05/02/19 taking 125 units with breakfast, 115 units with lunch, 110 units with supper, and variable dose at bedtime) Current orders for Inpatient glycemic control: Levermir 25  Units BID, Novolog 0-20 units Q4H, Tradjenta 5 mg daily; Decadron 6 mg Q24H, Vital @ 20 ml/hr  Inpatient Diabetes Program Recommendations:   Insulin-Basal: If steroids are continued, please consider increasing Levemir to 40 units BID.   Insulin-Tube Feeding Coverage: Please consider ordering Novolog 4 units Q4H for tube feeding coverage. If tube feeding is stopped or held then Novolog tube feeding coverage should also be stopped or held.  IV insulin: If Levemir increased and Novolog tube feeding coverage ordered and glucose remains consistently greater than 200 mg/dl, please consider discontinuing all SQ insulin and using ICU  Glycemic Control Phase 2 IV insulin.  NOTE: In reviewing chart, noted patient is followed by Renae Gloss, NP (Endocrinology) and last visit was 05/02/19.   Thanks, Barnie Alderman, RN, MSN, CDE Diabetes Coordinator Inpatient Diabetes Program [phone] (Team Pager from 8am to 5pm)

## 2019-08-08 NOTE — Progress Notes (Signed)
Spoke with and updated patient's wife [name] over the phone. I was able to answer any questions she had.

## 2019-08-08 NOTE — Progress Notes (Signed)
E Link notified of new LUE localization and absence of RUE localization.  No other focal neurological deficits noted at this time.
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
## 2019-08-08 NOTE — Progress Notes (Signed)
Patient's wife called and was given update.  Discussed IV infusions that patient is currently getting and discussed ventilator settings. All questions answered.

## 2019-08-09 ENCOUNTER — Inpatient Hospital Stay (HOSPITAL_COMMUNITY): Payer: Medicare Other

## 2019-08-09 DIAGNOSIS — J1289 Other viral pneumonia: Secondary | ICD-10-CM | POA: Diagnosis not present

## 2019-08-09 DIAGNOSIS — J80 Acute respiratory distress syndrome: Secondary | ICD-10-CM | POA: Diagnosis not present

## 2019-08-09 DIAGNOSIS — U071 COVID-19: Secondary | ICD-10-CM | POA: Diagnosis not present

## 2019-08-09 DIAGNOSIS — J449 Chronic obstructive pulmonary disease, unspecified: Secondary | ICD-10-CM | POA: Diagnosis not present

## 2019-08-09 LAB — CBC WITH DIFFERENTIAL/PLATELET
Abs Immature Granulocytes: 0.19 10*3/uL — ABNORMAL HIGH (ref 0.00–0.07)
Basophils Absolute: 0 10*3/uL (ref 0.0–0.1)
Basophils Relative: 0 %
Eosinophils Absolute: 0 10*3/uL (ref 0.0–0.5)
Eosinophils Relative: 0 %
HCT: 36.1 % — ABNORMAL LOW (ref 39.0–52.0)
Hemoglobin: 11.2 g/dL — ABNORMAL LOW (ref 13.0–17.0)
Immature Granulocytes: 2 %
Lymphocytes Relative: 9 %
Lymphs Abs: 0.9 10*3/uL (ref 0.7–4.0)
MCH: 33.6 pg (ref 26.0–34.0)
MCHC: 31 g/dL (ref 30.0–36.0)
MCV: 108.4 fL — ABNORMAL HIGH (ref 80.0–100.0)
Monocytes Absolute: 1 10*3/uL (ref 0.1–1.0)
Monocytes Relative: 11 %
Neutro Abs: 7.5 10*3/uL (ref 1.7–7.7)
Neutrophils Relative %: 78 %
Platelets: 341 10*3/uL (ref 150–400)
RBC: 3.33 MIL/uL — ABNORMAL LOW (ref 4.22–5.81)
RDW: 17.2 % — ABNORMAL HIGH (ref 11.5–15.5)
WBC: 9.7 10*3/uL (ref 4.0–10.5)
nRBC: 0.4 % — ABNORMAL HIGH (ref 0.0–0.2)

## 2019-08-09 LAB — GLUCOSE, CAPILLARY
Glucose-Capillary: 150 mg/dL — ABNORMAL HIGH (ref 70–99)
Glucose-Capillary: 158 mg/dL — ABNORMAL HIGH (ref 70–99)
Glucose-Capillary: 159 mg/dL — ABNORMAL HIGH (ref 70–99)
Glucose-Capillary: 164 mg/dL — ABNORMAL HIGH (ref 70–99)
Glucose-Capillary: 170 mg/dL — ABNORMAL HIGH (ref 70–99)
Glucose-Capillary: 174 mg/dL — ABNORMAL HIGH (ref 70–99)
Glucose-Capillary: 176 mg/dL — ABNORMAL HIGH (ref 70–99)
Glucose-Capillary: 179 mg/dL — ABNORMAL HIGH (ref 70–99)
Glucose-Capillary: 180 mg/dL — ABNORMAL HIGH (ref 70–99)
Glucose-Capillary: 191 mg/dL — ABNORMAL HIGH (ref 70–99)
Glucose-Capillary: 194 mg/dL — ABNORMAL HIGH (ref 70–99)
Glucose-Capillary: 200 mg/dL — ABNORMAL HIGH (ref 70–99)
Glucose-Capillary: 204 mg/dL — ABNORMAL HIGH (ref 70–99)
Glucose-Capillary: 204 mg/dL — ABNORMAL HIGH (ref 70–99)
Glucose-Capillary: 206 mg/dL — ABNORMAL HIGH (ref 70–99)
Glucose-Capillary: 206 mg/dL — ABNORMAL HIGH (ref 70–99)
Glucose-Capillary: 241 mg/dL — ABNORMAL HIGH (ref 70–99)

## 2019-08-09 LAB — COMPREHENSIVE METABOLIC PANEL
ALT: 40 U/L (ref 0–44)
AST: 59 U/L — ABNORMAL HIGH (ref 15–41)
Albumin: 1.8 g/dL — ABNORMAL LOW (ref 3.5–5.0)
Alkaline Phosphatase: 95 U/L (ref 38–126)
Anion gap: 9 (ref 5–15)
BUN: 47 mg/dL — ABNORMAL HIGH (ref 8–23)
CO2: 29 mmol/L (ref 22–32)
Calcium: 9.5 mg/dL (ref 8.9–10.3)
Chloride: 115 mmol/L — ABNORMAL HIGH (ref 98–111)
Creatinine, Ser: 1 mg/dL (ref 0.61–1.24)
GFR calc Af Amer: >60 mL/min (ref >60)
GFR calc non Af Amer: >60 mL/min (ref >60)
Glucose, Bld: 176 mg/dL — ABNORMAL HIGH (ref 70–99)
Potassium: 3.9 mmol/L (ref 3.5–5.1)
Sodium: 153 mmol/L — ABNORMAL HIGH (ref 135–145)
Total Bilirubin: 0.4 mg/dL (ref 0.3–1.2)
Total Protein: 6.1 g/dL — ABNORMAL LOW (ref 6.5–8.1)

## 2019-08-09 LAB — CULTURE, BLOOD (ROUTINE X 2): Culture: NO GROWTH

## 2019-08-09 LAB — POCT I-STAT 7, (LYTES, BLD GAS, ICA,H+H)
Acid-Base Excess: 4 mmol/L — ABNORMAL HIGH (ref 0.0–2.0)
Bicarbonate: 30.6 mmol/L — ABNORMAL HIGH (ref 20.0–28.0)
Calcium, Ion: 1.42 mmol/L — ABNORMAL HIGH (ref 1.15–1.40)
HCT: 40 % (ref 39.0–52.0)
Hemoglobin: 13.6 g/dL (ref 13.0–17.0)
O2 Saturation: 79 %
Patient temperature: 102
Potassium: 3.8 mmol/L (ref 3.5–5.1)
Sodium: 155 mmol/L — ABNORMAL HIGH (ref 135–145)
TCO2: 32 mmol/L (ref 22–32)
pCO2 arterial: 54.7 mmHg — ABNORMAL HIGH (ref 32.0–48.0)
pH, Arterial: 7.364 (ref 7.350–7.450)
pO2, Arterial: 51 mmHg — ABNORMAL LOW (ref 83.0–108.0)

## 2019-08-09 LAB — TSH: TSH: 0.172 u[IU]/mL — ABNORMAL LOW (ref 0.350–4.500)

## 2019-08-09 LAB — AMMONIA: Ammonia: 27 umol/L (ref 9–35)

## 2019-08-09 LAB — VITAMIN B12: Vitamin B-12: 170 pg/mL — ABNORMAL LOW (ref 180–914)

## 2019-08-09 LAB — D-DIMER, QUANTITATIVE: D-Dimer, Quant: 2.72 ug/mL-FEU — ABNORMAL HIGH (ref 0.00–0.50)

## 2019-08-09 LAB — MAGNESIUM: Magnesium: 2.3 mg/dL (ref 1.7–2.4)

## 2019-08-09 LAB — PHOSPHORUS: Phosphorus: 2.8 mg/dL (ref 2.5–4.6)

## 2019-08-09 MED ORDER — CHLORHEXIDINE GLUCONATE 0.12 % MT SOLN
15.0000 mL | Freq: Two times a day (BID) | OROMUCOSAL | Status: DC
  Administered 2019-08-09 – 2019-09-19 (×79): 15 mL via OROMUCOSAL
  Filled 2019-08-09 (×37): qty 15

## 2019-08-09 MED ORDER — SODIUM CHLORIDE 0.9 % IV SOLN: INTRAVENOUS | Status: DC

## 2019-08-09 MED ORDER — VITAL 1.5 CAL PO LIQD
1000.0000 mL | ORAL | Status: AC
  Administered 2019-08-11 – 2019-08-17 (×6): 1000 mL
  Filled 2019-08-09: qty 1000

## 2019-08-09 MED ORDER — FREE WATER
200.0000 mL | Status: DC
  Administered 2019-08-09 – 2019-08-11 (×10): 200 mL

## 2019-08-09 MED ORDER — PRO-STAT SUGAR FREE PO LIQD
30.0000 mL | Freq: Four times a day (QID) | ORAL | Status: AC
  Administered 2019-08-09 – 2019-08-18 (×39): 30 mL
  Filled 2019-08-09 (×38): qty 30

## 2019-08-09 MED ORDER — NONFORMULARY OR COMPOUNDED ITEM
1.0000 | Freq: Every day | Status: DC
  Administered 2019-08-09 – 2019-09-15 (×36): 1
  Filled 2019-08-09 (×46): qty 1

## 2019-08-09 MED ORDER — INSULIN REGULAR(HUMAN) IN NACL 100-0.9 UT/100ML-% IV SOLN
INTRAVENOUS | Status: DC
  Administered 2019-08-10: 6.5 [IU]/h via INTRAVENOUS

## 2019-08-09 MED ORDER — PRO-STAT SUGAR FREE PO LIQD: 60.0000 mL | Freq: Four times a day (QID) | ORAL | Status: DC

## 2019-08-09 MED ORDER — DEXTROSE 50 % IV SOLN
0.0000 mL | INTRAVENOUS | Status: DC | PRN
  Filled 2019-08-09: qty 50

## 2019-08-09 NOTE — Progress Notes (Signed)
[facility]  Discussed situation with wife Added free water for hypernatremia  Roselie Awkward, MD [place] [facility] Pager: [phone] Cell: [phone] If no response, call [phone]

## 2019-08-09 NOTE — Progress Notes (Signed)
Tried to call pt's wife [name] back multiple times, but continuous busy signal. Will keep trying.

## 2019-08-09 NOTE — Progress Notes (Signed)
Spoke with [facility] physician regarding insulin drip and Dr. Jeannene Patella stated that we will continue insulin drip overnight.

## 2019-08-09 NOTE — Progress Notes (Signed)
Lavaged pt with 10 cc saline flush per MD order. Small amount of clear/white thin secretions suctioned out with no complications. VS within normal limits.

## 2019-08-09 NOTE — Progress Notes (Signed)
Called and spoke with Kathlee Nations, RN at [facility] and asked her to speak with MD about transitioning patient off insulin drip since endotool gave this RN message to call MD for transition orders. Also made Kathlee Nations, RN aware that wife wishes for patient to stay on insulin drip because he is a brittle diabetic.

## 2019-08-09 NOTE — Progress Notes (Signed)
eLink Physician-Brief Progress Note Patient Name: ALRICK WAIBEL III DOB: 10/23/1950 MRN: [id number]   Date of Service  08/09/2019  HPI/Events of Note  Pt receiving glucose containing iv fluids at the same time as enteral nutrition, he is on an insulin infusion.  eICU Interventions  Dextrose infusion discontinued.        Kerry Kass Ogan 08/09/2019, 10:03 PM

## 2019-08-09 NOTE — Progress Notes (Signed)
Patient's daughter, [name], called and was given an update on Mr Venhuizen condition and plan of care today. His wife, Shauna Hugh, called also, and she was given update, including plans to undergo at CT head today, since Mr Ariano remains unresponsive despite being off sedatives and pain medication since 11/23 at 1420.

## 2019-08-09 NOTE — Progress Notes (Signed)
Face Time facilitated with patient's daughter [name].  All questions answered.

## 2019-08-09 NOTE — Progress Notes (Addendum)
Nutrition Follow-up RD working remotely.  DOCUMENTATION CODES:   Obesity unspecified  INTERVENTION:   Change TF to better meet re-estimated nutrition needs:   Vital 1.5 at 65 ml/h (1560 ml per day).   Pro-stat 30 ml QID.   Provides 2740 kcal, 165 gm protein, 1192 ml free water daily.  NUTRITION DIAGNOSIS:   Increased nutrient needs related to acute illness(COVID-19 positive) as evidenced by estimated needs.  Ongoing   GOAL:   Provide needs based on ASPEN/SCCM guidelines  Being addressed with TF  MONITOR:   Vent status, Labs, Weight trends  ASSESSMENT:   68 y.o. male with medical history of DM, HTN, PVD s/p right BKA, BPH, chronic pain syndrome, COPD, and hyperlipidemia. He presented to the ED on 11/19 due to SOB, cough, wheezing, and confusion. He was diagnosed with COVID-19 on 11/15. He was 71% on RA when EMS arrived and he was placed on NRB. In the ED he had a temperature of 102.1. CXR showed multifocal PNA and pattern consistent with COVID-19.  Plans for CT head today to evaluate ongoing unresponsiveness. Patient remains on an insulin drip.  OG tube in place. Receiving Vital High Protein at 40 ml/h with Pro-stat 60 ml TID providing 1560 kcal, 174 gm protein, 803 ml free water daily.  Patient remains intubated on ventilator support MV: 18.4 L/min Temp (24hrs), Avg:102.4 F (39.1 C), Min:101.3 F (38.5 C), Max:103.6 F (39.8 C)    Labs reviewed. Sodium 155 (H) CBG's: [phone]  Medications reviewed and include decadron, tradjenta, insulin drip.  Admission weight 104.8 kg Today's weight 107.4 kg I/O even  Diet Order:   Diet Order            Diet NPO time specified  Diet effective now              EDUCATION NEEDS:   No education needs have been identified at this time  Skin:  Skin Assessment: Reviewed RN Assessment(MASD to perineum, skin tear to R arm)  Last BM:  11/23  Height:   Ht Readings from Last 1 Encounters:  08/05/19 6'  1" (1.854 m)    Weight:   Wt Readings from Last 1 Encounters:  08/09/19 107.4 kg    Ideal Body Weight:  83.6 kg  BMI:  Body mass index is 31.24 kg/m.  Estimated Nutritional Needs:   Kcal:  [id number]  Protein:  >/= 167 grams  Fluid:  >/= 2.6 L    Molli Barrows, RD, LDN, [place] Pager [phone] After Hours Pager [phone]

## 2019-08-09 NOTE — Progress Notes (Signed)
Video call facilitated with patient's wife [name] via [facility]. Wife updated and all questions answered by this RN.
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
## 2019-08-10 ENCOUNTER — Inpatient Hospital Stay (HOSPITAL_COMMUNITY): Payer: Medicare Other

## 2019-08-10 DIAGNOSIS — K219 Gastro-esophageal reflux disease without esophagitis: Secondary | ICD-10-CM | POA: Diagnosis not present

## 2019-08-10 DIAGNOSIS — L8990 Pressure ulcer of unspecified site, unspecified stage: Secondary | ICD-10-CM | POA: Insufficient documentation

## 2019-08-10 DIAGNOSIS — R509 Fever, unspecified: Secondary | ICD-10-CM | POA: Diagnosis not present

## 2019-08-10 DIAGNOSIS — R7989 Other specified abnormal findings of blood chemistry: Secondary | ICD-10-CM | POA: Diagnosis not present

## 2019-08-10 DIAGNOSIS — J80 Acute respiratory distress syndrome: Secondary | ICD-10-CM | POA: Diagnosis not present

## 2019-08-10 DIAGNOSIS — E669 Obesity, unspecified: Secondary | ICD-10-CM

## 2019-08-10 DIAGNOSIS — K449 Diaphragmatic hernia without obstruction or gangrene: Secondary | ICD-10-CM

## 2019-08-10 DIAGNOSIS — E1169 Type 2 diabetes mellitus with other specified complication: Secondary | ICD-10-CM | POA: Diagnosis not present

## 2019-08-10 DIAGNOSIS — U071 COVID-19: Secondary | ICD-10-CM | POA: Diagnosis not present

## 2019-08-10 LAB — URINALYSIS, COMPLETE (UACMP) WITH MICROSCOPIC
Bilirubin Urine: NEGATIVE
Glucose, UA: 50 mg/dL — AB
Ketones, ur: NEGATIVE mg/dL
Leukocytes,Ua: NEGATIVE
Nitrite: NEGATIVE
Protein, ur: NEGATIVE mg/dL
Specific Gravity, Urine: 1.019 (ref 1.005–1.030)
pH: 5 (ref 5.0–8.0)

## 2019-08-10 LAB — CBC WITH DIFFERENTIAL/PLATELET
Abs Immature Granulocytes: 0.15 10*3/uL — ABNORMAL HIGH (ref 0.00–0.07)
Basophils Absolute: 0 10*3/uL (ref 0.0–0.1)
Basophils Relative: 0 %
Eosinophils Absolute: 0 10*3/uL (ref 0.0–0.5)
Eosinophils Relative: 0 %
HCT: 32.7 % — ABNORMAL LOW (ref 39.0–52.0)
Hemoglobin: 10.1 g/dL — ABNORMAL LOW (ref 13.0–17.0)
Immature Granulocytes: 2 %
Lymphocytes Relative: 16 %
Lymphs Abs: 1.4 10*3/uL (ref 0.7–4.0)
MCH: 34 pg (ref 26.0–34.0)
MCHC: 30.9 g/dL (ref 30.0–36.0)
MCV: 110.1 fL — ABNORMAL HIGH (ref 80.0–100.0)
Monocytes Absolute: 0.8 10*3/uL (ref 0.1–1.0)
Monocytes Relative: 9 %
Neutro Abs: 6.3 10*3/uL (ref 1.7–7.7)
Neutrophils Relative %: 73 %
Platelets: 323 10*3/uL (ref 150–400)
RBC: 2.97 MIL/uL — ABNORMAL LOW (ref 4.22–5.81)
RDW: 17 % — ABNORMAL HIGH (ref 11.5–15.5)
WBC: 8.6 10*3/uL (ref 4.0–10.5)
nRBC: 0.6 % — ABNORMAL HIGH (ref 0.0–0.2)

## 2019-08-10 LAB — BASIC METABOLIC PANEL WITH GFR
Anion gap: 9 (ref 5–15)
BUN: 76 mg/dL — ABNORMAL HIGH (ref 8–23)
CO2: 29 mmol/L (ref 22–32)
Calcium: 9.4 mg/dL (ref 8.9–10.3)
Chloride: 116 mmol/L — ABNORMAL HIGH (ref 98–111)
Creatinine, Ser: 1.49 mg/dL — ABNORMAL HIGH (ref 0.61–1.24)
GFR calc Af Amer: 56 mL/min — ABNORMAL LOW
GFR calc non Af Amer: 48 mL/min — ABNORMAL LOW
Glucose, Bld: 229 mg/dL — ABNORMAL HIGH (ref 70–99)
Potassium: 4.1 mmol/L (ref 3.5–5.1)
Sodium: 154 mmol/L — ABNORMAL HIGH (ref 135–145)

## 2019-08-10 LAB — GLUCOSE, CAPILLARY
Glucose-Capillary: 146 mg/dL — ABNORMAL HIGH (ref 70–99)
Glucose-Capillary: 146 mg/dL — ABNORMAL HIGH (ref 70–99)
Glucose-Capillary: 147 mg/dL — ABNORMAL HIGH (ref 70–99)
Glucose-Capillary: 153 mg/dL — ABNORMAL HIGH (ref 70–99)
Glucose-Capillary: 156 mg/dL — ABNORMAL HIGH (ref 70–99)
Glucose-Capillary: 161 mg/dL — ABNORMAL HIGH (ref 70–99)
Glucose-Capillary: 163 mg/dL — ABNORMAL HIGH (ref 70–99)
Glucose-Capillary: 172 mg/dL — ABNORMAL HIGH (ref 70–99)
Glucose-Capillary: 172 mg/dL — ABNORMAL HIGH (ref 70–99)
Glucose-Capillary: 176 mg/dL — ABNORMAL HIGH (ref 70–99)
Glucose-Capillary: 179 mg/dL — ABNORMAL HIGH (ref 70–99)
Glucose-Capillary: 180 mg/dL — ABNORMAL HIGH (ref 70–99)
Glucose-Capillary: 183 mg/dL — ABNORMAL HIGH (ref 70–99)
Glucose-Capillary: 186 mg/dL — ABNORMAL HIGH (ref 70–99)
Glucose-Capillary: 190 mg/dL — ABNORMAL HIGH (ref 70–99)
Glucose-Capillary: 203 mg/dL — ABNORMAL HIGH (ref 70–99)
Glucose-Capillary: 222 mg/dL — ABNORMAL HIGH (ref 70–99)
Glucose-Capillary: 228 mg/dL — ABNORMAL HIGH (ref 70–99)

## 2019-08-10 LAB — PHOSPHORUS: Phosphorus: 2.2 mg/dL — ABNORMAL LOW (ref 2.5–4.6)

## 2019-08-10 LAB — MAGNESIUM: Magnesium: 2.5 mg/dL — ABNORMAL HIGH (ref 1.7–2.4)

## 2019-08-10 MED ORDER — ENOXAPARIN SODIUM 120 MG/0.8ML ~~LOC~~ SOLN
110.0000 mg | Freq: Two times a day (BID) | SUBCUTANEOUS | Status: DC
  Administered 2019-08-10 – 2019-08-18 (×17): 110 mg via SUBCUTANEOUS
  Filled 2019-08-10 (×18): qty 0.8

## 2019-08-10 MED ORDER — FOLIC ACID 5 MG/ML IJ SOLN
1.0000 mg | Freq: Once | INTRAMUSCULAR | Status: AC
  Administered 2019-08-10: 1 mg via INTRAVENOUS
  Filled 2019-08-10: qty 0.2

## 2019-08-10 MED ORDER — TERAZOSIN HCL 5 MG PO CAPS
5.0000 mg | ORAL_CAPSULE | Freq: Every day | ORAL | Status: DC
  Administered 2019-08-10 – 2019-08-17 (×8): 5 mg via ORAL
  Filled 2019-08-10 (×9): qty 1

## 2019-08-10 MED ORDER — DILTIAZEM 12 MG/ML ORAL SUSPENSION
60.0000 mg | Freq: Four times a day (QID) | ORAL | Status: DC
  Administered 2019-08-10: 60 mg
  Filled 2019-08-10 (×3): qty 6

## 2019-08-10 MED ORDER — THIAMINE HCL 100 MG/ML IJ SOLN
100.0000 mg | Freq: Every day | INTRAMUSCULAR | Status: DC
  Administered 2019-08-10 – 2019-08-12 (×3): 100 mg via INTRAVENOUS
  Filled 2019-08-10 (×3): qty 2

## 2019-08-10 MED ORDER — POLYETHYLENE GLYCOL 3350 17 G PO PACK
17.0000 g | PACK | Freq: Every day | ORAL | Status: AC
  Administered 2019-08-10 – 2019-08-12 (×3): 17 g via ORAL
  Filled 2019-08-10 (×5): qty 1

## 2019-08-10 MED ORDER — FENTANYL CITRATE (PF) 100 MCG/2ML IJ SOLN
25.0000 ug | INTRAMUSCULAR | Status: DC | PRN
  Administered 2019-08-15 – 2019-08-17 (×6): 100 ug via INTRAVENOUS
  Filled 2019-08-10 (×6): qty 2

## 2019-08-10 MED ORDER — DILTIAZEM 12 MG/ML ORAL SUSPENSION
90.0000 mg | Freq: Four times a day (QID) | ORAL | Status: DC
  Administered 2019-08-11 – 2019-08-23 (×48): 90 mg
  Filled 2019-08-10 (×54): qty 9

## 2019-08-10 MED ORDER — INSULIN REGULAR(HUMAN) IN NACL 100-0.9 UT/100ML-% IV SOLN
INTRAVENOUS | Status: DC
  Administered 2019-08-10: 20:00:00 13 [IU]/h via INTRAVENOUS
  Administered 2019-08-10: 7 [IU]/h via INTRAVENOUS
  Administered 2019-08-11: 12 [IU]/h via INTRAVENOUS
  Administered 2019-08-11: 9 [IU]/h via INTRAVENOUS
  Administered 2019-08-12: 8.5 [IU]/h via INTRAVENOUS
  Administered 2019-08-12: 6 [IU]/h via INTRAVENOUS
  Administered 2019-08-13: 9 [IU]/h via INTRAVENOUS
  Administered 2019-08-13: 8.5 [IU]/h via INTRAVENOUS
  Administered 2019-08-14: 5.5 [IU]/h via INTRAVENOUS
  Administered 2019-08-15: 6 [IU]/h via INTRAVENOUS
  Administered 2019-08-15: 8.5 [IU]/h via INTRAVENOUS
  Administered 2019-08-16: 12:00:00 4.6 [IU]/h via INTRAVENOUS
  Administered 2019-08-17: 4.8 [IU]/h via INTRAVENOUS
  Administered 2019-08-18 (×2): 6.5 [IU]/h via INTRAVENOUS
  Administered 2019-08-18: 6 [IU]/h via INTRAVENOUS
  Administered 2019-08-19: 4.6 [IU]/h via INTRAVENOUS
  Administered 2019-08-21: 8 [IU]/h via INTRAVENOUS
  Administered 2019-08-21: 10.5 [IU]/h via INTRAVENOUS
  Administered 2019-08-22: 05:00:00 6 [IU]/h via INTRAVENOUS
  Filled 2019-08-10 (×19): qty 100

## 2019-08-10 MED ORDER — DILTIAZEM 12 MG/ML ORAL SUSPENSION
60.0000 mg | Freq: Four times a day (QID) | ORAL | Status: DC
  Administered 2019-08-10: 60 mg via ORAL
  Filled 2019-08-10 (×4): qty 6

## 2019-08-10 MED ORDER — SODIUM CHLORIDE 0.9 % IV SOLN
1.0000 mg | Freq: Once | INTRAVENOUS | Status: DC
  Filled 2019-08-10: qty 0.2

## 2019-08-10 NOTE — Progress Notes (Signed)
Fresh ice packs applied to patient's axilla and groin bilaterally throughout the shift to aide in fever reduction.

## 2019-08-10 NOTE — Progress Notes (Addendum)
Discrepancy between endotool rate and actual rate of insulin, insulin gtt currently running at 7 units/hr, unable to correct 10 units/hr rate in endotool. Rechecked glucose, per endotool changed insulin gtt rate to 17 units/hr. Will continue to monitor.  RN attempted to call patient's spouse at number listed in chart, phone number not working at this time.

## 2019-08-10 NOTE — Procedures (Signed)
Cortrak  Person Inserting Tube:  Keia Rask C, RD Tube Type:  Cortrak - 43 inches Tube Location:  Left nare Initial Placement:  Stomach Secured by: Bridle Technique Used to Measure Tube Placement:  Documented cm marking at nare/ corner of mouth Cortrak Secured At:  75 cm    Cortrak Tube Team Note:  Consult received to place a Cortrak feeding tube.   No x-ray is required. RN may begin using tube.   If the tube becomes dislodged please keep the tube and contact the Cortrak team at [email or web address] ([email or web address]) for replacement.  If after hours and replacement cannot be delayed, place a NG tube and confirm placement with an abdominal x-ray.    Elsa Ploch RD, LDN, CNSC [phone] Pager [phone] After Hours Pager   

## 2019-08-10 NOTE — Progress Notes (Signed)
ANTICOAGULATION CONSULT NOTE - Follow Up Consult  Pharmacy Consult for Enoxaparin Indication: atrial fibrillation  Allergies  Allergen Reactions  . Bactrim [Sulfamethoxazole-Trimethoprim] Other (See Comments)    Blisters in his mouth  . Budesonide-Formoterol Fumarate Other (See Comments)    Hyperglycemia  Hyperglycemia   . Advair Diskus [Fluticasone-Salmeterol] Other (See Comments)    hyperglycemia  . Hydromorphone Hives, Itching and Nausea And Vomiting  . Pulmicort [Budesonide] Other (See Comments)    Hyperglycemia   . Morphine Itching  . Soap Itching and Other (See Comments)    Other Reaction: ivory soap=itching     Patient Measurements: Height: 6\' 1"  (185.4 cm) Weight: 239 lb 6.7 oz (108.6 kg) IBW/kg (Calculated) : 79.9 Heparin Dosing Weight: 103 kg  Vital Signs: Temp: 102 F (38.9 C) (11/25 0730) Temp Source: Esophageal (11/25 0400) BP: 114/54 (11/25 0730) Pulse Rate: 106 (11/25 0730)  Labs: Recent Labs    08/07/19 1200  08/08/19 0430 08/09/19 0602 08/09/19 0622 08/10/19 0428  HGB  --   --  10.5* 11.2* 13.6 10.1*  HCT  --   --  32.4* 36.1* 40.0 32.7*  PLT  --   --  443* 341  --  323  HEPARINUNFRC 0.47  --   --   --   --   --   CREATININE  --    < > 0.78 1.00  --  1.49*   < > = values in this interval not displayed.    Estimated Creatinine Clearance: 63 mL/min (A) (by C-G formula based on SCr of 1.49 mg/dL (H)).   Medications:  Infusions:  . diltiazem (CARDIZEM) infusion 15 mg/hr (08/10/19 0700)  . feeding supplement (VITAL 1.5 CAL) 65 mL/hr at 08/09/19 1530  . fentaNYL infusion INTRAVENOUS Stopped (08/08/19 1420)  . insulin 9.5 Units/hr (08/10/19 0646)    Assessment: 68 y/o Mon SQ Lovenox for Afib RVR. H/H low stable. Plt wnl. SCr slowly trending up 1.49.   Goal of Therapy:  Anti-Xa level 0.6-1 units/ml 4hrs after LMWH dose given Monitor platelets by anticoagulation protocol: Yes   Plan:  Continue Lovenox 1 mg/kg [place] q12h. Adjust dose to 110  mg given new patient weight  Monitor renal function, CBC, s/s bleeding.  Albertina Parr, PharmD., BCPS Clinical Pharmacist Clinical phone for 08/10/19 until 3:30pm: [phone]

## 2019-08-10 NOTE — Progress Notes (Signed)
Spoke with Dr. Lucile Shutters and made MD aware that patient is a Type II diabetic but that insulin drip orders are for type I.  MD gave order to change to Type I in order for insulin drip.  RN also discussed that patient's sodium was elevated on labs yesterday and that with insulin drip order set NS infusion was ordered. MD gave order to d/c NS infusion order.

## 2019-08-10 NOTE — Progress Notes (Signed)
Video call with wife complete.

## 2019-08-10 NOTE — Progress Notes (Signed)
Patient watch 2 rings (grey) necklace and pedant (both grey) sent to security.

## 2019-08-10 NOTE — Progress Notes (Signed)
Lower extremity venous has been completed.   Preliminary results in CV Proc.   [name] 08/10/2019 1:49 PM

## 2019-08-10 NOTE — Progress Notes (Signed)
Patient's daughter [name] called and was updated by this RN.  All questions answered.

## 2019-08-10 NOTE — Progress Notes (Signed)
Called [name] and spoke with Dr. Lucile Shutters and made MD aware that patient's blood pressure has been low for an hour now since giving 50 mg metoprolol via tube.  MD acknowledged and stated he would adjust med and order IVF bolus.
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
## 2019-08-11 ENCOUNTER — Inpatient Hospital Stay (HOSPITAL_COMMUNITY): Payer: Medicare Other

## 2019-08-11 DIAGNOSIS — Z9911 Dependence on respirator [ventilator] status: Secondary | ICD-10-CM

## 2019-08-11 DIAGNOSIS — I4891 Unspecified atrial fibrillation: Secondary | ICD-10-CM

## 2019-08-11 DIAGNOSIS — J151 Pneumonia due to Pseudomonas: Secondary | ICD-10-CM

## 2019-08-11 DIAGNOSIS — R579 Shock, unspecified: Secondary | ICD-10-CM

## 2019-08-11 LAB — POCT I-STAT 7, (LYTES, BLD GAS, ICA,H+H)
Acid-Base Excess: 6 mmol/L — ABNORMAL HIGH (ref 0.0–2.0)
Bicarbonate: 31.2 mmol/L — ABNORMAL HIGH (ref 20.0–28.0)
Calcium, Ion: 1.49 mmol/L — ABNORMAL HIGH (ref 1.15–1.40)
HCT: 26 % — ABNORMAL LOW (ref 39.0–52.0)
Hemoglobin: 8.8 g/dL — ABNORMAL LOW (ref 13.0–17.0)
O2 Saturation: 86 %
Patient temperature: 39.1
Potassium: 4.3 mmol/L (ref 3.5–5.1)
Sodium: 157 mmol/L — ABNORMAL HIGH (ref 135–145)
TCO2: 33 mmol/L — ABNORMAL HIGH (ref 22–32)
pCO2 arterial: 54 mmHg — ABNORMAL HIGH (ref 32.0–48.0)
pH, Arterial: 7.379 (ref 7.350–7.450)
pO2, Arterial: 60 mmHg — ABNORMAL LOW (ref 83.0–108.0)

## 2019-08-11 LAB — BASIC METABOLIC PANEL
Anion gap: 7 (ref 5–15)
BUN: 87 mg/dL — ABNORMAL HIGH (ref 8–23)
CO2: 29 mmol/L (ref 22–32)
Calcium: 9.2 mg/dL (ref 8.9–10.3)
Chloride: 117 mmol/L — ABNORMAL HIGH (ref 98–111)
Creatinine, Ser: 1.61 mg/dL — ABNORMAL HIGH (ref 0.61–1.24)
GFR calc Af Amer: 51 mL/min — ABNORMAL LOW (ref >60)
GFR calc non Af Amer: 44 mL/min — ABNORMAL LOW (ref >60)
Glucose, Bld: 189 mg/dL — ABNORMAL HIGH (ref 70–99)
Potassium: 4.6 mmol/L (ref 3.5–5.1)
Sodium: 153 mmol/L — ABNORMAL HIGH (ref 135–145)

## 2019-08-11 LAB — GLUCOSE, CAPILLARY
Glucose-Capillary: 113 mg/dL — ABNORMAL HIGH (ref 70–99)
Glucose-Capillary: 130 mg/dL — ABNORMAL HIGH (ref 70–99)
Glucose-Capillary: 146 mg/dL — ABNORMAL HIGH (ref 70–99)
Glucose-Capillary: 147 mg/dL — ABNORMAL HIGH (ref 70–99)
Glucose-Capillary: 149 mg/dL — ABNORMAL HIGH (ref 70–99)
Glucose-Capillary: 150 mg/dL — ABNORMAL HIGH (ref 70–99)
Glucose-Capillary: 153 mg/dL — ABNORMAL HIGH (ref 70–99)
Glucose-Capillary: 157 mg/dL — ABNORMAL HIGH (ref 70–99)
Glucose-Capillary: 159 mg/dL — ABNORMAL HIGH (ref 70–99)
Glucose-Capillary: 165 mg/dL — ABNORMAL HIGH (ref 70–99)
Glucose-Capillary: 166 mg/dL — ABNORMAL HIGH (ref 70–99)
Glucose-Capillary: 174 mg/dL — ABNORMAL HIGH (ref 70–99)
Glucose-Capillary: 180 mg/dL — ABNORMAL HIGH (ref 70–99)
Glucose-Capillary: 185 mg/dL — ABNORMAL HIGH (ref 70–99)
Glucose-Capillary: 196 mg/dL — ABNORMAL HIGH (ref 70–99)
Glucose-Capillary: 197 mg/dL — ABNORMAL HIGH (ref 70–99)
Glucose-Capillary: 204 mg/dL — ABNORMAL HIGH (ref 70–99)
Glucose-Capillary: 232 mg/dL — ABNORMAL HIGH (ref 70–99)

## 2019-08-11 LAB — CULTURE, RESPIRATORY W GRAM STAIN

## 2019-08-11 LAB — T3, FREE: T3, Free: 1.2 pg/mL — ABNORMAL LOW (ref 2.0–4.4)

## 2019-08-11 MED ORDER — FREE WATER
300.0000 mL | Status: DC
  Administered 2019-08-11 – 2019-08-12 (×6): 300 mL

## 2019-08-11 MED ORDER — LEVOFLOXACIN IN D5W 750 MG/150ML IV SOLN
750.0000 mg | INTRAVENOUS | Status: DC
  Administered 2019-08-11 – 2019-08-13 (×3): 750 mg via INTRAVENOUS
  Filled 2019-08-11 (×4): qty 150

## 2019-08-11 MED ORDER — METOPROLOL TARTRATE 25 MG PO TABS
12.5000 mg | ORAL_TABLET | Freq: Two times a day (BID) | ORAL | Status: DC
  Administered 2019-08-11 – 2019-08-13 (×5): 12.5 mg
  Filled 2019-08-11 (×5): qty 1

## 2019-08-11 MED ORDER — VASOPRESSIN 20 UNIT/ML IV SOLN
0.0300 [IU]/min | INTRAVENOUS | Status: DC
  Administered 2019-08-11: 0.03 [IU]/min via INTRAVENOUS
  Filled 2019-08-11 (×2): qty 2

## 2019-08-11 MED ORDER — ALBUMIN HUMAN 5 % IV SOLN
12.5000 g | Freq: Once | INTRAVENOUS | Status: AC
  Administered 2019-08-11: 12.5 g via INTRAVENOUS
  Filled 2019-08-11: qty 250

## 2019-08-11 MED ORDER — PIPERACILLIN-TAZOBACTAM 3.375 G IVPB
3.3750 g | Freq: Three times a day (TID) | INTRAVENOUS | Status: DC
  Administered 2019-08-11 – 2019-08-13 (×7): 3.375 g via INTRAVENOUS
  Filled 2019-08-11 (×9): qty 50

## 2019-08-11 MED ORDER — LACTATED RINGERS IV BOLUS
500.0000 mL | Freq: Once | INTRAVENOUS | Status: AC
  Administered 2019-08-11: 500 mL via INTRAVENOUS

## 2019-08-11 MED ORDER — SODIUM CHLORIDE 0.9 % IV SOLN
2.0000 g | Freq: Three times a day (TID) | INTRAVENOUS | Status: DC
  Administered 2019-08-11: 2 g via INTRAVENOUS
  Filled 2019-08-11: qty 2

## 2019-08-11 NOTE — Progress Notes (Addendum)
RN called patient's spouse [name] at number listed in chart, RN left VM with update and instructions to call back with any questions. RN called patient's daughter [name] at number listed in chart, [name] updated on patient condition and requested to facetime patient later today.  1700 Patient's wife [name] updated via phone. Patient's daughter [name] given opportunity to facetime with patient.

## 2019-08-11 NOTE — Progress Notes (Signed)
Called [name] and spoke with Dr. Lucile Shutters and made MD aware that albumin has been started but that it takes 4 hours to infuse and patient's blood pressure is now 74/39 with MAP of 48.  MD stated he would change the order to infuse albumin faster and would order a vasopressor.  RN made MD aware of heart rate 130's afib and that midnight dose of diltiazem was not given due to low blood pressure. Also patient febrile. MD acknowledged all mentioned above and stated he would place orders.

## 2019-08-11 NOTE — Progress Notes (Signed)
eLink Physician-Brief Progress Note Patient Name: [name] DOB: Nov 03, 1950 MRN: [id number]   Date of Service  08/11/2019  HPI/Events of Note  Hypotension persisting  eICU Interventions  ALBUMIN 12.5 GM IV BOLUS X 1, Begin Vasopressin infusion.         Kerry Kass Ogan 08/11/2019, 1:44 AM

## 2019-08-11 NOTE — Progress Notes (Signed)
Dr. Lucile Shutters called back and stated to give current infusion of albumin as bolus over 30 minutes and to start vasopressin.  MD instructed RN to hold off on 2nd dose of albumin to see if first dose brings patient's blood pressure up.

## 2019-08-11 NOTE — Progress Notes (Signed)
Pharmacy Antibiotic Note  [name] is a 68 y.o. male admitted on 07/17/2019 with pneumonia.  Pharmacy initially consulted for Cefepime and Levaquin dosing but cefepime switched to Zosyn due to patient's encephalopathy. Trach aspirate growing pseudomonas and stenotrophomonas. Unable to use bactrim given patient's allergy.   WBC wnl. Tm 103.2. CrCl ~ 60 mL/min   Plan: -Zosyn 3.375 gm IV q 8 hours -Levaquin 750 mg IV Q 24 hours -Monitor CBC, renal fx, cultures and clinical progress   Height: 6\' 1"  (185.4 cm) Weight: 239 lb 10.2 oz (108.7 kg) IBW/kg (Calculated) : 79.9  Temp (24hrs), Avg:101.2 F (38.4 C), Min:98.6 F (37 C), Max:103.2 F (39.6 C)  Recent Labs  Lab 07/17/2019 1732 07/29/2019 1953  08/05/19 0804 08/05/19 1234  08/06/19 0432  08/07/19 0433 08/07/19 1630 08/08/19 0430 08/09/19 0602 08/10/19 0428 08/11/19 0500  WBC 13.6*  --   --  15.4*  --   --  11.2*  --  9.5  --  8.5 9.7 8.6  --   CREATININE 0.91  --    < > 1.64* 1.59*   < > 0.78   < > 0.90 0.87 0.78 1.00 1.49* 1.61*  LATICACIDVEN 2.1* 1.7  --  2.3* 2.6*  --   --   --   --   --   --   --   --   --    < > = values in this interval not displayed.    Estimated Creatinine Clearance: 58.3 mL/min (A) (by C-G formula based on SCr of 1.61 mg/dL (H)).    Allergies  Allergen Reactions  . Bactrim [Sulfamethoxazole-Trimethoprim] Other (See Comments)    Blisters in his mouth  . Budesonide-Formoterol Fumarate Other (See Comments)    Hyperglycemia  Hyperglycemia   . Advair Diskus [Fluticasone-Salmeterol] Other (See Comments)    hyperglycemia  . Hydromorphone Hives, Itching and Nausea And Vomiting  . Pulmicort [Budesonide] Other (See Comments)    Hyperglycemia   . Morphine Itching  . Soap Itching and Other (See Comments)    Other Reaction: ivory soap=itching     Antimicrobials this admission: 11/19 Remdesivir >> (11/23) 11/19 Ceftriaxone >> 11/23 11/19 Azithromycin >> 11/23 11/20 Vancomycin  >>11/22 11/26 Cefepime >> 11/26 Levaquin >>   Microbiology results: 11/11 Covid + 11/19 UCx: insignificant growth 11/19 BCx: 1/3 Coag neg staph 11/21 MRSA PCR: neg 11/20 TA >> Few pseudomonas / few stenotrophomonas (pending)  11/25 TA >> pending   Thank you for allowing pharmacy to be a part of this patient's care.  Albertina Parr, PharmD., BCPS Clinical Pharmacist Clinical phone for 08/11/19 until 5pm: [phone]

## 2019-08-11 NOTE — Progress Notes (Signed)
Spoke with Dr. Lucile Shutters and made MD aware that patient's blood pressure is 103/72 with MAP of 82 on vasopressin but that MAP has only been consistently above 65 for around 30 minutes.  MD stated "hold off for now on giving the second dose of albumin."

## 2019-08-11 NOTE — Progress Notes (Signed)
[facility] Progress Note Patient Name: [name] DOB: 1950-11-28 MRN: [id number]   Date of Service  08/11/2019  HPI/Events of Note  Pt hypotensive since receiving his evening dose of Lopressor 50 mg,  eICU Interventions  Albumin 5 % 250  Ml iv bolus x 1. Metoprolol dose reduced to 12.5 mg po bid, with a plan to gradually titrate up dose.        Okoronkwo U Ogan 08/11/2019, 1:20 AM

## 2019-08-11 NOTE — Progress Notes (Signed)
Called [facility] and spoke with Gretchin, RN and asked her to remind Dr. Lucile Shutters about need for fluid bolus because patient's blood pressure remains low.
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
## 2019-08-11 NOTE — Progress Notes (Signed)
Messaged Gretchin, RN at [facility] about bladder scan results of > 661 and need for in and out cath.

## 2019-08-12 DIAGNOSIS — G9340 Encephalopathy, unspecified: Secondary | ICD-10-CM

## 2019-08-12 LAB — BASIC METABOLIC PANEL
Anion gap: 6 (ref 5–15)
BUN: 59 mg/dL — ABNORMAL HIGH (ref 8–23)
CO2: 31 mmol/L (ref 22–32)
Calcium: 10.4 mg/dL — ABNORMAL HIGH (ref 8.9–10.3)
Chloride: 121 mmol/L — ABNORMAL HIGH (ref 98–111)
Creatinine, Ser: 1.12 mg/dL (ref 0.61–1.24)
GFR calc Af Amer: >60 mL/min (ref >60)
GFR calc non Af Amer: >60 mL/min (ref >60)
Glucose, Bld: 165 mg/dL — ABNORMAL HIGH (ref 70–99)
Potassium: 5.2 mmol/L — ABNORMAL HIGH (ref 3.5–5.1)
Sodium: 158 mmol/L — ABNORMAL HIGH (ref 135–145)

## 2019-08-12 LAB — POCT I-STAT 7, (LYTES, BLD GAS, ICA,H+H)
Acid-Base Excess: 7 mmol/L — ABNORMAL HIGH (ref 0.0–2.0)
Bicarbonate: 31.6 mmol/L — ABNORMAL HIGH (ref 20.0–28.0)
Calcium, Ion: 1.54 mmol/L (ref 1.15–1.40)
HCT: 26 % — ABNORMAL LOW (ref 39.0–52.0)
Hemoglobin: 8.8 g/dL — ABNORMAL LOW (ref 13.0–17.0)
O2 Saturation: 94 %
Patient temperature: 37.4
Potassium: 4.8 mmol/L (ref 3.5–5.1)
Sodium: 156 mmol/L — ABNORMAL HIGH (ref 135–145)
TCO2: 33 mmol/L — ABNORMAL HIGH (ref 22–32)
pCO2 arterial: 48.1 mmHg — ABNORMAL HIGH (ref 32.0–48.0)
pH, Arterial: 7.428 (ref 7.350–7.450)
pO2, Arterial: 72 mmHg — ABNORMAL LOW (ref 83.0–108.0)

## 2019-08-12 LAB — GLUCOSE, CAPILLARY
Glucose-Capillary: 133 mg/dL — ABNORMAL HIGH (ref 70–99)
Glucose-Capillary: 135 mg/dL — ABNORMAL HIGH (ref 70–99)
Glucose-Capillary: 139 mg/dL — ABNORMAL HIGH (ref 70–99)
Glucose-Capillary: 145 mg/dL — ABNORMAL HIGH (ref 70–99)
Glucose-Capillary: 146 mg/dL — ABNORMAL HIGH (ref 70–99)
Glucose-Capillary: 153 mg/dL — ABNORMAL HIGH (ref 70–99)
Glucose-Capillary: 156 mg/dL — ABNORMAL HIGH (ref 70–99)
Glucose-Capillary: 158 mg/dL — ABNORMAL HIGH (ref 70–99)
Glucose-Capillary: 160 mg/dL — ABNORMAL HIGH (ref 70–99)
Glucose-Capillary: 161 mg/dL — ABNORMAL HIGH (ref 70–99)
Glucose-Capillary: 162 mg/dL — ABNORMAL HIGH (ref 70–99)
Glucose-Capillary: 168 mg/dL — ABNORMAL HIGH (ref 70–99)
Glucose-Capillary: 169 mg/dL — ABNORMAL HIGH (ref 70–99)
Glucose-Capillary: 170 mg/dL — ABNORMAL HIGH (ref 70–99)
Glucose-Capillary: 171 mg/dL — ABNORMAL HIGH (ref 70–99)
Glucose-Capillary: 182 mg/dL — ABNORMAL HIGH (ref 70–99)

## 2019-08-12 MED ORDER — LINAGLIPTIN 5 MG PO TABS
5.0000 mg | ORAL_TABLET | Freq: Every day | ORAL | Status: DC
  Administered 2019-08-13 – 2019-09-09 (×28): 5 mg
  Filled 2019-08-12 (×29): qty 1

## 2019-08-12 MED ORDER — VITAMIN B-1 100 MG PO TABS: 100.0000 mg | ORAL_TABLET | Freq: Every day | ORAL | Status: DC

## 2019-08-12 MED ORDER — FREE WATER
400.0000 mL | Status: DC
  Administered 2019-08-12 – 2019-08-13 (×5): 400 mL

## 2019-08-12 MED ORDER — VITAMIN B-1 100 MG PO TABS
100.0000 mg | ORAL_TABLET | Freq: Every day | ORAL | Status: DC
  Administered 2019-08-13 – 2019-08-17 (×5): 100 mg
  Filled 2019-08-12 (×5): qty 1

## 2019-08-12 NOTE — Consult Note (Addendum)
[facility] Nurse Consult Note: Reason for Consult:Deep tissue injury to sacrum.  Altered mental status, intubated and critically ill. Patient is on low air loss mattress replacement.  Will continue to turn and reposition every two hours.  Will implement topical orders.  Wound type:deep tissue injury to sacrum. Intact maroon discoloration.  Pressure Injury POA: No Measurement: 3 cm x 2 cm intact Wound [id number] skin  Thin and epithelium may begin to slough.  Edema present Drainage (amount, consistency, odor) none Periwound:intact  Frequently moist Dressing procedure/placement/frequency: Cleanse sacral wound with soap and water and pat dry.  Apply barrier paste twice daily and PRN soilage.  Cover sacrum with prophylactic sacral silicone foam.  Change foam twice weekly and PRn soilage or dislodgement. Turn and reposition to offload pressure every two hours.  Will not follow at this time.  Please re-consult if needed.  Domenic Moras MSN, RN, FNP-BC CWON Wound, Ostomy, Continence Nurse Pager [phone]

## 2019-08-12 NOTE — Progress Notes (Signed)
RN updated patient's wife [name] via phone and patient's daughter [name] via phone. All questions and concerns answered at this time. Patient's daughter given opportunity to facetime patient.

## 2019-08-12 NOTE — Progress Notes (Signed)
Lavaged pt with 10 cc saline flush per MD order. Small amount of pink tinged/white thick secretions suctioned out. Pt stable throughout with no complications. VS within normal limits.

## 2019-08-12 NOTE — Progress Notes (Signed)
Inpatient Diabetes Program Recommendations  AACE/ADA: New Consensus Statement on Inpatient Glycemic Control   Target Ranges:  Prepandial:   less than 140 mg/dL      Peak postprandial:   less than 180 mg/dL (1-2 hours)      Critically ill patients:  140 - 180 mg/dL  Results for Creely, Thaison L III [name] (MRN [id number]) as of 08/12/2019 08:52  Ref. Range 08/12/2019 00:49 08/12/2019 02:51 08/12/2019 03:49 08/12/2019 04:49 08/12/2019 06:21 08/12/2019 08:40  Glucose-Capillary Latest Ref Range: 70 - 99 mg/dL 160 (H) 139 (H) 146 (H) 153 (H) 156 (H) 161 (H)    Review of Glycemic Control  Diabetes history: DM2 Outpatient Diabetes medications: Humulin R U500 0-125 units QID (per Endo note on 05/02/19 taking 125 units with breakfast, 115 units with lunch, 110 units with supper, and variable dose at bedtime) Current orders for Inpatient glycemic control: IV insulin; Decadron 6 mg Q24H, Vital @ 65 ml/hr  Inpatient Diabetes Program Recommendations:   Insulin at transition from IV to SQ: Once MD is ready to transition from IV to SQ insulin, please consider ordering Levemir 45 units BID, Novolog 0-20 units Q4H, and Novolog 6 units Q4H for tube feeding coverage.  Thanks, Barnie Alderman, RN, MSN, CDE Diabetes Coordinator Inpatient Diabetes Program [phone] (Team Pager from 8am to 5pm)
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
## 2019-08-13 DIAGNOSIS — Z885 Allergy status to narcotic agent status: Secondary | ICD-10-CM

## 2019-08-13 DIAGNOSIS — E1151 Type 2 diabetes mellitus with diabetic peripheral angiopathy without gangrene: Secondary | ICD-10-CM

## 2019-08-13 DIAGNOSIS — Z87891 Personal history of nicotine dependence: Secondary | ICD-10-CM

## 2019-08-13 DIAGNOSIS — U071 COVID-19: Secondary | ICD-10-CM | POA: Diagnosis not present

## 2019-08-13 DIAGNOSIS — Z1635 Resistance to multiple antimicrobial drugs: Secondary | ICD-10-CM

## 2019-08-13 DIAGNOSIS — Z881 Allergy status to other antibiotic agents status: Secondary | ICD-10-CM

## 2019-08-13 DIAGNOSIS — J1289 Other viral pneumonia: Secondary | ICD-10-CM | POA: Diagnosis not present

## 2019-08-13 DIAGNOSIS — J449 Chronic obstructive pulmonary disease, unspecified: Secondary | ICD-10-CM | POA: Diagnosis not present

## 2019-08-13 DIAGNOSIS — Z91048 Other nonmedicinal substance allergy status: Secondary | ICD-10-CM

## 2019-08-13 DIAGNOSIS — Z888 Allergy status to other drugs, medicaments and biological substances status: Secondary | ICD-10-CM

## 2019-08-13 LAB — GLUCOSE, CAPILLARY
Glucose-Capillary: 133 mg/dL — ABNORMAL HIGH (ref 70–99)
Glucose-Capillary: 143 mg/dL — ABNORMAL HIGH (ref 70–99)
Glucose-Capillary: 146 mg/dL — ABNORMAL HIGH (ref 70–99)
Glucose-Capillary: 156 mg/dL — ABNORMAL HIGH (ref 70–99)
Glucose-Capillary: 157 mg/dL — ABNORMAL HIGH (ref 70–99)
Glucose-Capillary: 160 mg/dL — ABNORMAL HIGH (ref 70–99)
Glucose-Capillary: 162 mg/dL — ABNORMAL HIGH (ref 70–99)
Glucose-Capillary: 164 mg/dL — ABNORMAL HIGH (ref 70–99)
Glucose-Capillary: 166 mg/dL — ABNORMAL HIGH (ref 70–99)
Glucose-Capillary: 169 mg/dL — ABNORMAL HIGH (ref 70–99)
Glucose-Capillary: 172 mg/dL — ABNORMAL HIGH (ref 70–99)
Glucose-Capillary: 175 mg/dL — ABNORMAL HIGH (ref 70–99)

## 2019-08-13 LAB — BASIC METABOLIC PANEL
Anion gap: 5 (ref 5–15)
BUN: 58 mg/dL — ABNORMAL HIGH (ref 8–23)
CO2: 32 mmol/L (ref 22–32)
Calcium: 10.1 mg/dL (ref 8.9–10.3)
Chloride: 119 mmol/L — ABNORMAL HIGH (ref 98–111)
Creatinine, Ser: 1.11 mg/dL (ref 0.61–1.24)
GFR calc Af Amer: >60 mL/min (ref >60)
GFR calc non Af Amer: >60 mL/min (ref >60)
Glucose, Bld: 167 mg/dL — ABNORMAL HIGH (ref 70–99)
Potassium: 4.9 mmol/L (ref 3.5–5.1)
Sodium: 156 mmol/L — ABNORMAL HIGH (ref 135–145)

## 2019-08-13 MED ORDER — METOPROLOL TARTRATE 12.5 MG HALF TABLET
25.0000 mg | ORAL_TABLET | Freq: Two times a day (BID) | ORAL | Status: DC
  Administered 2019-08-13 – 2019-08-27 (×28): 25 mg
  Filled 2019-08-13: qty 1
  Filled 2019-08-13 (×3): qty 2
  Filled 2019-08-13: qty 1
  Filled 2019-08-13: qty 2
  Filled 2019-08-13: qty 1
  Filled 2019-08-13 (×2): qty 2
  Filled 2019-08-13: qty 1
  Filled 2019-08-13 (×7): qty 2
  Filled 2019-08-13 (×3): qty 1
  Filled 2019-08-13 (×2): qty 2
  Filled 2019-08-13: qty 1
  Filled 2019-08-13: qty 2
  Filled 2019-08-13 (×2): qty 1
  Filled 2019-08-13 (×2): qty 2

## 2019-08-13 MED ORDER — FREE WATER
250.0000 mL | Status: DC
  Administered 2019-08-13 – 2019-08-21 (×89): 250 mL

## 2019-08-13 MED ORDER — FUROSEMIDE 10 MG/ML IJ SOLN
40.0000 mg | Freq: Once | INTRAMUSCULAR | Status: AC
  Administered 2019-08-13: 40 mg via INTRAVENOUS
  Filled 2019-08-13: qty 4

## 2019-08-13 MED ORDER — DEXTROSE 5 % IV SOLN
2.5000 g | Freq: Three times a day (TID) | INTRAVENOUS | Status: DC
  Administered 2019-08-13 – 2019-08-22 (×26): 2.5 g via INTRAVENOUS
  Filled 2019-08-13 (×34): qty 12

## 2019-08-13 MED ORDER — ADULT MULTIVITAMIN LIQUID CH
15.0000 mL | Freq: Every day | ORAL | Status: DC
  Administered 2019-08-13 – 2019-09-18 (×37): 15 mL
  Filled 2019-08-13 (×39): qty 15

## 2019-08-13 MED ORDER — SODIUM CHLORIDE 0.9 % IV SOLN
2.0000 g | Freq: Three times a day (TID) | INTRAVENOUS | Status: DC
  Administered 2019-08-14 – 2019-08-17 (×11): 2 g via INTRAVENOUS
  Filled 2019-08-13 (×14): qty 2

## 2019-08-13 NOTE — Progress Notes (Signed)
ANTICOAGULATION CONSULT NOTE - Follow Up Consult  Pharmacy Consult for Enoxaparin Indication: atrial fibrillation  Allergies  Allergen Reactions  . Bactrim [Sulfamethoxazole-Trimethoprim] Other (See Comments)    Blisters in his mouth  . Budesonide-Formoterol Fumarate Other (See Comments)    Hyperglycemia  Hyperglycemia   . Advair Diskus [Fluticasone-Salmeterol] Other (See Comments)    hyperglycemia  . Hydromorphone Hives, Itching and Nausea And Vomiting  . Pulmicort [Budesonide] Other (See Comments)    Hyperglycemia   . Morphine Itching  . Soap Itching and Other (See Comments)    Other Reaction: ivory soap=itching     Patient Measurements: Height: 6\' 1"  (185.4 cm) Weight: 240 lb 1.3 oz (108.9 kg) IBW/kg (Calculated) : 79.9 Heparin Dosing Weight: 103 kg  Vital Signs: Temp: 100.8 F (38.2 C) (11/28 0600) Temp Source: Rectal (11/28 0330) BP: 151/43 (11/28 0600) Pulse Rate: 120 (11/28 0600)  Labs: Recent Labs    08/11/19 0329 08/11/19 0500 08/12/19 0317 08/12/19 1049 08/13/19 0258  HGB 8.8*  --  8.8*  --   --   HCT 26.0*  --  26.0*  --   --   CREATININE  --  1.61*  --  1.12 1.11    Estimated Creatinine Clearance: 84.7 mL/min (by C-G formula based on SCr of 1.11 mg/dL).   Medications:  Infusions:  . feeding supplement (VITAL 1.5 CAL) 1,000 mL (08/11/19 1759)  . insulin 7.5 Units/hr (08/13/19 0810)  . levofloxacin (LEVAQUIN) IV 750 mg (08/12/19 1018)  . piperacillin-tazobactam (ZOSYN)  IV 3.375 g (08/13/19 0559)  . vasopressin (PITRESSIN) infusion - *FOR SHOCK* Stopped (08/11/19 1726)    Assessment: 68 y/o Mon SQ Lovenox for Afib RVR. H/H low stable. Plt wnl on last check. SCr remains stable   Goal of Therapy:  Anti-Xa level 0.6-1 units/ml 4hrs after LMWH dose given Monitor platelets by anticoagulation protocol: Yes   Plan:  Continue Lovenox 1 mg/kg [place] q12h. Monitor renal function, CBC, s/s bleeding.  Albertina Parr, PharmD., BCPS Clinical  Pharmacist Clinical phone for 08/13/19 until 3:30pm: [phone]

## 2019-08-13 NOTE — Progress Notes (Signed)
eLink Physician-Brief Progress Note Patient Name: [name] DOB: 08/23/1951 MRN: [id number]   Date of Service  08/13/2019  HPI/Events of Note  Loose bowels, K 4.9. Covid +  eICU Interventions  - flexi seal ordered     Intervention Category Intermediate Interventions: Other:  Elmer Sow 08/13/2019, 10:05 PM

## 2019-08-13 NOTE — Progress Notes (Signed)
Video call with Daughter, [name] complete

## 2019-08-13 NOTE — Progress Notes (Signed)
Pharmacy Antibiotic Note  [name] is a 68 y.o. male admitted on 08/07/2019 with pneumonia.  Patient is currently on Zosyn and Levaquin but repeat trach aspirate is growing pseudomonas, stenotrophomonas (R to Levaquin and Bactrim) and serratia. ID consulted and is recommending Avycaz and aztreonam.   WBC wnl. Low grade fevers today. SCr has normalized   Plan: -Zosyn and Levaquin d/c'ed -Start Aztreonam 2 gm IV Q 8 hours -Avycaz 2.5 gm IV Q 8 hours per ID  -Monitor CBC, renal fx, cultures and clinical progress   Height: 6\' 1"  (185.4 cm) Weight: 240 lb 1.3 oz (108.9 kg) IBW/kg (Calculated) : 79.9  Temp (24hrs), Avg:100.9 F (38.3 C), Min:100.4 F (38 C), Max:101.8 F (38.8 C)  Recent Labs  Lab 08/07/19 0433  08/08/19 0430 08/09/19 0602 08/10/19 0428 08/11/19 0500 08/12/19 1049 08/13/19 0258  WBC 9.5  --  8.5 9.7 8.6  --   --   --   CREATININE 0.90   < > 0.78 1.00 1.49* 1.61* 1.12 1.11   < > = values in this interval not displayed.    Estimated Creatinine Clearance: 84.7 mL/min (by C-G formula based on SCr of 1.11 mg/dL).    Allergies  Allergen Reactions  . Bactrim [Sulfamethoxazole-Trimethoprim] Other (See Comments)    Blisters in his mouth  . Budesonide-Formoterol Fumarate Other (See Comments)    Hyperglycemia  Hyperglycemia   . Advair Diskus [Fluticasone-Salmeterol] Other (See Comments)    hyperglycemia  . Hydromorphone Hives, Itching and Nausea And Vomiting  . Pulmicort [Budesonide] Other (See Comments)    Hyperglycemia   . Morphine Itching  . Soap Itching and Other (See Comments)    Other Reaction: ivory soap=itching     Antimicrobials this admission: 11/19 Remdesivir >> (11/23) 11/19 Ceftriaxone >> 11/23 11/19 Azithromycin >> 11/23 11/20 Vancomycin >>11/22 11/26 Zosyn >> 11/28 11/26 Levaquin >> 11/28 11/28 Avycaz>> 11/28 Aztreonam>>   Microbiology results: 11/11 Covid + 11/19 UCx: insignificant growth 11/19 BCx: 1/3 Coag neg  staph 11/21 MRSA PCR: neg 11/20 TA >> Few pseudomonas / few stenotrophomonas  11/25 TA >> Mod pseudomonax / mod stenotrophomonas / moderate serratia   Thank you for allowing pharmacy to be a part of this patient's care.  Albertina Parr, PharmD., BCPS Clinical Pharmacist Clinical phone for 08/13/19 until 5pm: [phone]

## 2019-08-13 NOTE — Progress Notes (Signed)
Assisted tele visit to patient with family member.  Shemeika Starzyk M, RN  
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
## 2019-08-13 NOTE — Progress Notes (Signed)
Pt lavaged with 10 cc saline flush per CCM MD order. Small amount of yellow/white secretions suctioned out with no complications.
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
## 2019-08-14 DIAGNOSIS — G9341 Metabolic encephalopathy: Secondary | ICD-10-CM

## 2019-08-14 DIAGNOSIS — B9689 Other specified bacterial agents as the cause of diseases classified elsewhere: Secondary | ICD-10-CM

## 2019-08-14 DIAGNOSIS — R509 Fever, unspecified: Secondary | ICD-10-CM

## 2019-08-14 DIAGNOSIS — J95851 Ventilator associated pneumonia: Secondary | ICD-10-CM

## 2019-08-14 DIAGNOSIS — B965 Pseudomonas (aeruginosa) (mallei) (pseudomallei) as the cause of diseases classified elsewhere: Secondary | ICD-10-CM

## 2019-08-14 DIAGNOSIS — L89303 Pressure ulcer of unspecified buttock, stage 3: Secondary | ICD-10-CM

## 2019-08-14 DIAGNOSIS — Z1624 Resistance to multiple antibiotics: Secondary | ICD-10-CM

## 2019-08-14 LAB — GLUCOSE, CAPILLARY
Glucose-Capillary: 134 mg/dL — ABNORMAL HIGH (ref 70–99)
Glucose-Capillary: 144 mg/dL — ABNORMAL HIGH (ref 70–99)
Glucose-Capillary: 146 mg/dL — ABNORMAL HIGH (ref 70–99)
Glucose-Capillary: 147 mg/dL — ABNORMAL HIGH (ref 70–99)
Glucose-Capillary: 152 mg/dL — ABNORMAL HIGH (ref 70–99)
Glucose-Capillary: 153 mg/dL — ABNORMAL HIGH (ref 70–99)
Glucose-Capillary: 154 mg/dL — ABNORMAL HIGH (ref 70–99)
Glucose-Capillary: 159 mg/dL — ABNORMAL HIGH (ref 70–99)
Glucose-Capillary: 159 mg/dL — ABNORMAL HIGH (ref 70–99)
Glucose-Capillary: 163 mg/dL — ABNORMAL HIGH (ref 70–99)
Glucose-Capillary: 171 mg/dL — ABNORMAL HIGH (ref 70–99)
Glucose-Capillary: 183 mg/dL — ABNORMAL HIGH (ref 70–99)
Glucose-Capillary: 185 mg/dL — ABNORMAL HIGH (ref 70–99)
Glucose-Capillary: 188 mg/dL — ABNORMAL HIGH (ref 70–99)
Glucose-Capillary: 193 mg/dL — ABNORMAL HIGH (ref 70–99)
Glucose-Capillary: 194 mg/dL — ABNORMAL HIGH (ref 70–99)
Glucose-Capillary: 213 mg/dL — ABNORMAL HIGH (ref 70–99)

## 2019-08-14 LAB — BASIC METABOLIC PANEL
Anion gap: 5 (ref 5–15)
BUN: 51 mg/dL — ABNORMAL HIGH (ref 8–23)
CO2: 29 mmol/L (ref 22–32)
Calcium: 8.7 mg/dL — ABNORMAL LOW (ref 8.9–10.3)
Chloride: 116 mmol/L — ABNORMAL HIGH (ref 98–111)
Creatinine, Ser: 0.97 mg/dL (ref 0.61–1.24)
GFR calc Af Amer: >60 mL/min (ref >60)
GFR calc non Af Amer: >60 mL/min (ref >60)
Glucose, Bld: 201 mg/dL — ABNORMAL HIGH (ref 70–99)
Potassium: 4.1 mmol/L (ref 3.5–5.1)
Sodium: 150 mmol/L — ABNORMAL HIGH (ref 135–145)

## 2019-08-14 NOTE — Progress Notes (Signed)
    [facility] Infectious [facility]   Reason for visit: Follow up on MDRO  Interval History: fever curve trending down  Physical Exam: Constitutional:  Vitals:   08/14/19 1200 08/14/19 1300  BP: (!) 136/54 (!) 147/49  Pulse: 98 (!) 101  Resp: (!) 21 17  Temp: 99.1 F (37.3 C)   SpO2: 95% 100%   patient appears in NAD  Impression/Plan: VAP.  On ceftazidime for Pseudomonas and Serratia, avibactam and aztreonam for Stenotrophomonas.  Awaiting any new sensitivities.  Evidence of this for Ree Kida trophomonas is mainly in vitro but working at this time.  Can use eravacycline if he declines any.  Will continue with the same.  Dr. Tommy Medal back on tomorrow and will follow up
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
## 2019-08-15 DIAGNOSIS — J80 Acute respiratory distress syndrome: Secondary | ICD-10-CM | POA: Diagnosis not present

## 2019-08-15 DIAGNOSIS — J1289 Other viral pneumonia: Secondary | ICD-10-CM | POA: Diagnosis not present

## 2019-08-15 DIAGNOSIS — J449 Chronic obstructive pulmonary disease, unspecified: Secondary | ICD-10-CM | POA: Diagnosis not present

## 2019-08-15 DIAGNOSIS — U071 COVID-19: Secondary | ICD-10-CM | POA: Diagnosis not present

## 2019-08-15 LAB — BASIC METABOLIC PANEL
Anion gap: 6 (ref 5–15)
BUN: 49 mg/dL — ABNORMAL HIGH (ref 8–23)
CO2: 28 mmol/L (ref 22–32)
Calcium: 9.3 mg/dL (ref 8.9–10.3)
Chloride: 115 mmol/L — ABNORMAL HIGH (ref 98–111)
Creatinine, Ser: 0.94 mg/dL (ref 0.61–1.24)
GFR calc Af Amer: >60 mL/min (ref >60)
GFR calc non Af Amer: >60 mL/min (ref >60)
Glucose, Bld: 178 mg/dL — ABNORMAL HIGH (ref 70–99)
Potassium: 4.3 mmol/L (ref 3.5–5.1)
Sodium: 149 mmol/L — ABNORMAL HIGH (ref 135–145)

## 2019-08-15 LAB — GLUCOSE, CAPILLARY
Glucose-Capillary: 133 mg/dL — ABNORMAL HIGH (ref 70–99)
Glucose-Capillary: 133 mg/dL — ABNORMAL HIGH (ref 70–99)
Glucose-Capillary: 139 mg/dL — ABNORMAL HIGH (ref 70–99)
Glucose-Capillary: 148 mg/dL — ABNORMAL HIGH (ref 70–99)
Glucose-Capillary: 151 mg/dL — ABNORMAL HIGH (ref 70–99)
Glucose-Capillary: 154 mg/dL — ABNORMAL HIGH (ref 70–99)
Glucose-Capillary: 157 mg/dL — ABNORMAL HIGH (ref 70–99)
Glucose-Capillary: 159 mg/dL — ABNORMAL HIGH (ref 70–99)
Glucose-Capillary: 163 mg/dL — ABNORMAL HIGH (ref 70–99)
Glucose-Capillary: 163 mg/dL — ABNORMAL HIGH (ref 70–99)
Glucose-Capillary: 163 mg/dL — ABNORMAL HIGH (ref 70–99)
Glucose-Capillary: 166 mg/dL — ABNORMAL HIGH (ref 70–99)
Glucose-Capillary: 171 mg/dL — ABNORMAL HIGH (ref 70–99)
Glucose-Capillary: 180 mg/dL — ABNORMAL HIGH (ref 70–99)
Glucose-Capillary: 180 mg/dL — ABNORMAL HIGH (ref 70–99)

## 2019-08-15 MED ORDER — LOPERAMIDE HCL 2 MG PO CAPS
2.0000 mg | ORAL_CAPSULE | ORAL | Status: DC | PRN
  Administered 2019-08-15 – 2019-08-16 (×4): 2 mg via ORAL
  Filled 2019-08-15 (×6): qty 1

## 2019-08-15 NOTE — Progress Notes (Addendum)
Spoke with and updated patients daughter [name] over the phone. Will plan to do a facetime this afternoon around 1700.

## 2019-08-15 NOTE — Progress Notes (Signed)
Assisted tele visit to patient with wife and daughter  .  Anay Rathe M Yolinda Duerr, RN   
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
## 2019-08-15 NOTE — Progress Notes (Signed)
   INFECTIOUS DISEASE ATTENDING ADDENDUM:   VAP: with MDR Stenotrophomonas Maltophila, Serratia and Pseudomonas  We are using ceftazidime/avibactam + aztreonam based on in vitro data that shows ceftaz/avibactam restoring aztreonam activity vs this bug when used in combination  Additional S from [place] is pending  Would in the interim plan on 7 day course of combination therapy  Dr. Baxter Flattery will followup culture data tomorrow.

## 2019-08-15 NOTE — Consult Note (Addendum)
[place] Nurse Consult Note: Reason for Consult: sacral pressure injuries Wound type: deep tissue pressure injury Pressure Injury POA: No Measurement: 6cm x 8cm x 0.1cm  Wound bed: 50% dark black but not eschar/50% dark non blanchable purple tissue  Drainage (amount, consistency, odor) none Periwound: intact  Dressing procedure/placement/frequency: Continue silicone foam and monitor for evolution. Until eschar forms or otherwise no other topical tx recommended.  Needs to be on low air loss mattress, bedside nurse to implement. Bedside nurse to attempt placement of fecal pouch; patient is having frequent stooling and does not have rectal tone for FMS.  Dietician following for supplementation via tube.   [place] Nurse team will follow with you and see patient within 10 days for wound assessments.  Please notify [place] nurses of any acute changes in the wounds or any new areas of concern [place] MSN, [place], [place], [place]

## 2019-08-15 NOTE — Progress Notes (Signed)
Spoke with and updated patient wife.

## 2019-08-16 ENCOUNTER — Inpatient Hospital Stay (HOSPITAL_COMMUNITY): Payer: Medicare Other

## 2019-08-16 DIAGNOSIS — Z8619 Personal history of other infectious and parasitic diseases: Secondary | ICD-10-CM

## 2019-08-16 DIAGNOSIS — J80 Acute respiratory distress syndrome: Secondary | ICD-10-CM | POA: Diagnosis not present

## 2019-08-16 DIAGNOSIS — J189 Pneumonia, unspecified organism: Secondary | ICD-10-CM

## 2019-08-16 DIAGNOSIS — U071 COVID-19: Secondary | ICD-10-CM | POA: Diagnosis not present

## 2019-08-16 DIAGNOSIS — J449 Chronic obstructive pulmonary disease, unspecified: Secondary | ICD-10-CM | POA: Diagnosis not present

## 2019-08-16 LAB — CBC WITH DIFFERENTIAL/PLATELET
Abs Immature Granulocytes: 0.07 10*3/uL (ref 0.00–0.07)
Basophils Absolute: 0 10*3/uL (ref 0.0–0.1)
Basophils Relative: 0 %
Eosinophils Absolute: 0.1 10*3/uL (ref 0.0–0.5)
Eosinophils Relative: 1 %
HCT: 25.6 % — ABNORMAL LOW (ref 39.0–52.0)
Hemoglobin: 7.9 g/dL — ABNORMAL LOW (ref 13.0–17.0)
Immature Granulocytes: 1 %
Lymphocytes Relative: 15 %
Lymphs Abs: 1.1 10*3/uL (ref 0.7–4.0)
MCH: 33.8 pg (ref 26.0–34.0)
MCHC: 30.9 g/dL (ref 30.0–36.0)
MCV: 109.4 fL — ABNORMAL HIGH (ref 80.0–100.0)
Monocytes Absolute: 0.3 10*3/uL (ref 0.1–1.0)
Monocytes Relative: 4 %
Neutro Abs: 5.9 10*3/uL (ref 1.7–7.7)
Neutrophils Relative %: 79 %
Platelets: 204 10*3/uL (ref 150–400)
RBC: 2.34 MIL/uL — ABNORMAL LOW (ref 4.22–5.81)
RDW: 15.2 % (ref 11.5–15.5)
WBC: 7.4 10*3/uL (ref 4.0–10.5)
nRBC: 0 % (ref 0.0–0.2)

## 2019-08-16 LAB — BASIC METABOLIC PANEL
Anion gap: 7 (ref 5–15)
BUN: 41 mg/dL — ABNORMAL HIGH (ref 8–23)
CO2: 25 mmol/L (ref 22–32)
Calcium: 9 mg/dL (ref 8.9–10.3)
Chloride: 116 mmol/L — ABNORMAL HIGH (ref 98–111)
Creatinine, Ser: 0.77 mg/dL (ref 0.61–1.24)
GFR calc Af Amer: >60 mL/min (ref >60)
GFR calc non Af Amer: >60 mL/min (ref >60)
Glucose, Bld: 146 mg/dL — ABNORMAL HIGH (ref 70–99)
Potassium: 4.3 mmol/L (ref 3.5–5.1)
Sodium: 148 mmol/L — ABNORMAL HIGH (ref 135–145)

## 2019-08-16 LAB — GLUCOSE, CAPILLARY
Glucose-Capillary: 115 mg/dL — ABNORMAL HIGH (ref 70–99)
Glucose-Capillary: 116 mg/dL — ABNORMAL HIGH (ref 70–99)
Glucose-Capillary: 126 mg/dL — ABNORMAL HIGH (ref 70–99)
Glucose-Capillary: 126 mg/dL — ABNORMAL HIGH (ref 70–99)
Glucose-Capillary: 153 mg/dL — ABNORMAL HIGH (ref 70–99)
Glucose-Capillary: 155 mg/dL — ABNORMAL HIGH (ref 70–99)
Glucose-Capillary: 156 mg/dL — ABNORMAL HIGH (ref 70–99)
Glucose-Capillary: 157 mg/dL — ABNORMAL HIGH (ref 70–99)
Glucose-Capillary: 159 mg/dL — ABNORMAL HIGH (ref 70–99)
Glucose-Capillary: 160 mg/dL — ABNORMAL HIGH (ref 70–99)
Glucose-Capillary: 165 mg/dL — ABNORMAL HIGH (ref 70–99)
Glucose-Capillary: 169 mg/dL — ABNORMAL HIGH (ref 70–99)
Glucose-Capillary: 172 mg/dL — ABNORMAL HIGH (ref 70–99)
Glucose-Capillary: 178 mg/dL — ABNORMAL HIGH (ref 70–99)
Glucose-Capillary: 185 mg/dL — ABNORMAL HIGH (ref 70–99)

## 2019-08-16 MED ORDER — JUVEN PO PACK
1.0000 | PACK | Freq: Two times a day (BID) | ORAL | Status: DC
  Administered 2019-08-16 – 2019-09-18 (×66): 1
  Filled 2019-08-16 (×68): qty 1

## 2019-08-16 MED ORDER — LOPERAMIDE HCL 1 MG/7.5ML PO SUSP
2.0000 mg | ORAL | Status: DC | PRN
  Administered 2019-08-17 – 2019-08-25 (×2): 2 mg
  Filled 2019-08-16 (×4): qty 15

## 2019-08-16 NOTE — Progress Notes (Signed)
Nutrition Follow-up RD working remotely.  DOCUMENTATION CODES:   Obesity unspecified  INTERVENTION:   Continue TF via Cortrak tube:   Vital 1.5 at 65 ml/h (1560 ml per day).   Pro-stat 30 ml QID.   Provides 2740 kcal, 165 gm protein, 1192 ml free water daily.  Add Juven BID via tube, each packet provides 80 calories, 8 grams of carbohydrate, 2.5  grams of protein (collagen), 7 grams of L-arginine and 7 grams of L-glutamine; supplement contains CaHMB, Vitamins C, E, B12 and Zinc to promote wound healing  NUTRITION DIAGNOSIS:   Increased nutrient needs related to acute illness(COVID-19 positive) as evidenced by estimated needs.  Ongoing   GOAL:   Provide needs based on ASPEN/SCCM guidelines  Being addressed with TF  MONITOR:   Vent status, Labs, Weight trends  ASSESSMENT:   68 y.o. male with medical history of DM, HTN, PVD s/p right BKA, BPH, chronic pain syndrome, COPD, and hyperlipidemia. He presented to the ED on 11/19 due to SOB, cough, wheezing, and confusion. He was diagnosed with COVID-19 on 11/15. He was 71% on RA when EMS arrived and he was placed on NRB. In the ED he had a temperature of 102.1. CXR showed multifocal PNA and pattern consistent with COVID-19.  Patient is beginning to follow commands. Weaning on ventilator. May need tracheostomy.  Cortrak tube placed 11/25, tip in the stomach. Receiving Vital 1.5 at 65 ml/h with Pro-stat 30 ml QID providing 2740 kcal, 165 gm protein, 1192 ml free water daily. Tolerating well. Free water flushes 250 ml every 2 hours.  Patient remains intubated on ventilator support MV: 18.6 L/min Temp (24hrs), Avg:100 F (37.8 C), Min:99.6 F (37.6 C), Max:100.6 F (38.1 C)    Labs reviewed. Sodium 148 (H) CBG's: [id number]  Medications reviewed and include potassium citrate, MVI, thiamine, tradjenta.  Admission weight 104.8 kg 08/15/19 110 kg I/O +9.35 L since admission  Diet Order:   Diet Order       Diet NPO time specified  Diet effective now              EDUCATION NEEDS:   No education needs have been identified at this time  Skin:  Skin Assessment: Skin Integrity Issues: Skin Integrity Issues:: DTI, Stage II, Other (Comment) DTI: sacrum Stage II: L ear Other: skin tear R arm  Last BM:  12/1  Height:   Ht Readings from Last 1 Encounters:  08/05/19 6\' 1"  (1.854 m)    Weight:   Wt Readings from Last 1 Encounters:  08/15/19 110 kg    Ideal Body Weight:  78.2 kg(adjusted for BKA)  BMI:  Body mass index is 31.99 kg/m.  Estimated Nutritional Needs:   Kcal:  [id number]  Protein:  >/= 167 grams  Fluid:  >/= 2.6 L    Molli Barrows, RD, LDN, [place] Pager [phone] After Hours Pager [phone]

## 2019-08-16 NOTE — Progress Notes (Signed)
Attending: Discussed with Noe Gens, full note following    Subjective: Continues to move more each day Weaning on vent Fever curve down  Objective: Vitals:   08/16/19 1148 08/16/19 1200 08/16/19 1300 08/16/19 1400  BP:  (!) 140/55 (!) 118/92 (!) 127/46  Pulse: (!) 103 (!) 115 94 89  Resp: (!) 26 (!) 26 (!) 41 (!) 24  Temp: (!) 100.6 F (38.1 C)     TempSrc: Axillary     SpO2: 95% 94% 93% 91%  Weight:      Height:       Vent Mode: PRVC FiO2 (%):  [40 %] 40 % Set Rate:  [26 bmp] 26 bmp Vt Set:  [560 mL] 560 mL PEEP:  [5 cmH20] 5 cmH20 Plateau Pressure:  [15 cmH20-29 cmH20] 22 cmH20  Intake/Output Summary (Last 24 hours) at 08/16/2019 1427 Last data filed at 08/16/2019 1400 Gross per 24 hour  Intake 4942.83 ml  Output 1200 ml  Net 3742.83 ml    General:  In bed on vent HENT: NCAT ETT in place PULM: CTA B, vent supported breathing CV: RRR, no mgr GI: BS+, soft, nontender MSK: normal bulk and tone Neuro: will open eyes, move spontaneously, followed commands (wiggle toes)    CBC    Component Value Date/Time   WBC 7.4 08/16/2019 0436   RBC 2.34 (L) 08/16/2019 0436   HGB 7.9 (L) 08/16/2019 0436   HCT 25.6 (L) 08/16/2019 0436   PLT 204 08/16/2019 0436   MCV 109.4 (H) 08/16/2019 0436   MCV 103.3 (A) 10/31/2015 1514   MCH 33.8 08/16/2019 0436   MCHC 30.9 08/16/2019 0436   RDW 15.2 08/16/2019 0436   LYMPHSABS 1.1 08/16/2019 0436   MONOABS 0.3 08/16/2019 0436   EOSABS 0.1 08/16/2019 0436   BASOSABS 0.0 08/16/2019 0436    BMET    Component Value Date/Time   NA 148 (H) 08/16/2019 0436   K 4.3 08/16/2019 0436   CL 116 (H) 08/16/2019 0436   CO2 25 08/16/2019 0436   GLUCOSE 146 (H) 08/16/2019 0436   GLUCOSE 402 (H) 09/28/2006 0925   BUN 41 (H) 08/16/2019 0436   CREATININE 0.77 08/16/2019 0436   CREATININE 0.86 04/17/2016 0929   CALCIUM 9.0 08/16/2019 0436   CALCIUM 9.4 03/03/2011 0926   GFRNONAA >60 08/16/2019 0436   GFRNONAA >89 04/17/2016 0929   GFRAA >60 08/16/2019 0436   GFRAA >89 04/17/2016 0929    CXR images reviewed, OG present, ETT not visible? Infiltrates unchanged  Impression/Plan: ARDS improving oxygenation, continue daily wean, advance ETT, probably needs tracheostomy, decide tomorrow and discuss with wife, VAP prevention Toxic met encephalopathy: slow improvement, hold sedation A-fib: continue dilt, lovenox HCAP, MDR: continue aztreonam, avycaz: ask infect prevention if needs isolation Rest per NP note  My cc time 30 minutes  Roselie Awkward, MD [place] [facility] Pager: [phone] Cell: [phone] After 3pm or if no response, call [phone]

## 2019-08-16 NOTE — Progress Notes (Signed)
Spoke with patient's wife and provided a full update/answered all questions.

## 2019-08-16 NOTE — Progress Notes (Signed)
ANTICOAGULATION CONSULT NOTE - Follow Up Consult  Pharmacy Consult for Enoxaparin Indication: atrial fibrillation  Allergies  Allergen Reactions  . Bactrim [Sulfamethoxazole-Trimethoprim] Other (See Comments)    Blisters in his mouth  . Budesonide-Formoterol Fumarate Other (See Comments)    Hyperglycemia  Hyperglycemia   . Advair Diskus [Fluticasone-Salmeterol] Other (See Comments)    hyperglycemia  . Hydromorphone Hives, Itching and Nausea And Vomiting  . Pulmicort [Budesonide] Other (See Comments)    Hyperglycemia   . Morphine Itching  . Soap Itching and Other (See Comments)    Other Reaction: ivory soap=itching     Patient Measurements: Height: 6\' 1"  (185.4 cm) Weight: 242 lb 8.1 oz (110 kg) IBW/kg (Calculated) : 79.9 Heparin Dosing Weight: 103 kg  Vital Signs: Temp: 100.6 F (38.1 C) (12/01 1148) Temp Source: Axillary (12/01 1148) BP: 132/56 (12/01 1139) Pulse Rate: 103 (12/01 1148)  Labs: Recent Labs    08/14/19 0604 08/15/19 0504 08/16/19 0436  HGB  --   --  7.9*  HCT  --   --  25.6*  PLT  --   --  204  CREATININE 0.97 0.94 0.77    Estimated Creatinine Clearance: 118.1 mL/min (by C-G formula based on SCr of 0.77 mg/dL).   Medications:  Infusions:  . aztreonam 2 g (08/16/19 0832)  . ceftazidime avibactam (AVYCAZ) IVPB 2.5 g (08/16/19 1311)  . feeding supplement (VITAL 1.5 CAL) 65 mL/hr at 08/15/19 0800  . insulin 4.6 Units/hr (08/16/19 1211)  . vasopressin (PITRESSIN) infusion - *FOR SHOCK* Stopped (08/14/19 0932)    Assessment: 68 y/o Mon SQ Lovenox for Afib RVR. H/H low and decreased further to 7.9, Plt wnl at 204.  No s/s bleeding reported.  SCr stable with CrCl > 100 ml/min.  Goal of Therapy:  Anti-Xa level 0.6-1 units/ml 4hrs after LMWH dose given Monitor platelets by anticoagulation protocol: Yes   Plan:  Continue Lovenox 1 mg/kg [place] q12h. Monitor renal function, CBC, s/s bleeding.   Gretta Arab PharmD, BCPS Clinical pharmacist  phone 7am- 5pm: [phone] 08/16/2019 1:52 PM

## 2019-08-16 NOTE — Progress Notes (Signed)
Pharmacy Antibiotic Note  [name] is a 68 y.o. male admitted on 07/19/2019 with pneumonia.  Patient is currently on Avycaz and Aztreonam per ID for repeat trach aspirate growing pseudomonas, stenotrophomonas (R to Levaquin and Bactrim) and serratia.  Today is day #4 of combination Avycaz and Aztreonam.  WBC wnl. Low grade fevers today, Tm 100.8. SCr has normalized with CrCl > 100 ml/min.  Plan: - Continue Aztreonam 2 gm IV Q 8 hours - Continue Avycaz 2.5 gm IV Q 8 hours per ID  - Monitor CBC, renal fx, cultures and clinical progress  - Per ID, planning for 7 day course.  Height: 6\' 1"  (185.4 cm) Weight: 242 lb 8.1 oz (110 kg) IBW/kg (Calculated) : 79.9  Temp (24hrs), Avg:100.2 F (37.9 C), Min:99.6 F (37.6 C), Max:100.8 F (38.2 C)  Recent Labs  Lab 08/10/19 0428  08/12/19 1049 08/13/19 0258 08/14/19 0604 08/15/19 0504 08/16/19 0436  WBC 8.6  --   --   --   --   --  7.4  CREATININE 1.49*   < > 1.12 1.11 0.97 0.94 0.77   < > = values in this interval not displayed.    Estimated Creatinine Clearance: 118.1 mL/min (by C-G formula based on SCr of 0.77 mg/dL).    Allergies  Allergen Reactions  . Bactrim [Sulfamethoxazole-Trimethoprim] Other (See Comments)    Blisters in his mouth  . Budesonide-Formoterol Fumarate Other (See Comments)    Hyperglycemia  Hyperglycemia   . Advair Diskus [Fluticasone-Salmeterol] Other (See Comments)    hyperglycemia  . Hydromorphone Hives, Itching and Nausea And Vomiting  . Pulmicort [Budesonide] Other (See Comments)    Hyperglycemia   . Morphine Itching  . Soap Itching and Other (See Comments)    Other Reaction: ivory soap=itching     Antimicrobials this admission: 11/19 Remdesivir >> (11/23) 11/19 Ceftriaxone >> 11/23 11/19 Azithromycin >> 11/23 11/20 Vancomycin >>11/22 11/26 Zosyn >> 11/28 11/26 Levaquin >> 11/28 11/28 Avycaz>> 11/28 Aztreonam>>   Microbiology results: 11/11 Covid + 11/19 UCx: insignificant  growth 11/19 BCx: 1/3 Coag neg staph 11/21 MRSA PCR: neg 11/20 TA >> Few pseudomonas / few stenotrophomonas  11/25 TA >> Mod pseudomonax / mod stenotrophomonas / moderate serratia   Thank you for allowing pharmacy to be a part of this patient's care.  Gretta Arab PharmD, BCPS Clinical pharmacist phone 7am- 5pm: [phone] 08/16/2019 1:58 PM

## 2019-08-16 NOTE — Progress Notes (Signed)
Facilitated facetime visit with patient's daughter, [name].

## 2019-08-16 NOTE — Progress Notes (Signed)
Lavaged pt with 10 cc saline flush per MD order. Moderate amount of yellow/tan thick secretions suctioned out with no complications.
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
## 2019-08-16 DEATH — deceased

## 2019-08-17 DIAGNOSIS — G92 Toxic encephalopathy: Secondary | ICD-10-CM

## 2019-08-17 DIAGNOSIS — J449 Chronic obstructive pulmonary disease, unspecified: Secondary | ICD-10-CM | POA: Diagnosis not present

## 2019-08-17 DIAGNOSIS — J1289 Other viral pneumonia: Secondary | ICD-10-CM | POA: Diagnosis not present

## 2019-08-17 DIAGNOSIS — E1169 Type 2 diabetes mellitus with other specified complication: Secondary | ICD-10-CM | POA: Diagnosis not present

## 2019-08-17 DIAGNOSIS — U071 COVID-19: Secondary | ICD-10-CM | POA: Diagnosis not present

## 2019-08-17 LAB — BASIC METABOLIC PANEL
Anion gap: 5 (ref 5–15)
BUN: 36 mg/dL — ABNORMAL HIGH (ref 8–23)
CO2: 25 mmol/L (ref 22–32)
Calcium: 8.9 mg/dL (ref 8.9–10.3)
Chloride: 113 mmol/L — ABNORMAL HIGH (ref 98–111)
Creatinine, Ser: 0.73 mg/dL (ref 0.61–1.24)
GFR calc Af Amer: >60 mL/min (ref >60)
GFR calc non Af Amer: >60 mL/min (ref >60)
Glucose, Bld: 172 mg/dL — ABNORMAL HIGH (ref 70–99)
Potassium: 3.9 mmol/L (ref 3.5–5.1)
Sodium: 143 mmol/L (ref 135–145)

## 2019-08-17 LAB — GLUCOSE, CAPILLARY
Glucose-Capillary: 142 mg/dL — ABNORMAL HIGH (ref 70–99)
Glucose-Capillary: 146 mg/dL — ABNORMAL HIGH (ref 70–99)
Glucose-Capillary: 152 mg/dL — ABNORMAL HIGH (ref 70–99)
Glucose-Capillary: 154 mg/dL — ABNORMAL HIGH (ref 70–99)
Glucose-Capillary: 156 mg/dL — ABNORMAL HIGH (ref 70–99)
Glucose-Capillary: 158 mg/dL — ABNORMAL HIGH (ref 70–99)
Glucose-Capillary: 162 mg/dL — ABNORMAL HIGH (ref 70–99)
Glucose-Capillary: 167 mg/dL — ABNORMAL HIGH (ref 70–99)
Glucose-Capillary: 167 mg/dL — ABNORMAL HIGH (ref 70–99)
Glucose-Capillary: 168 mg/dL — ABNORMAL HIGH (ref 70–99)
Glucose-Capillary: 170 mg/dL — ABNORMAL HIGH (ref 70–99)
Glucose-Capillary: 182 mg/dL — ABNORMAL HIGH (ref 70–99)
Glucose-Capillary: 193 mg/dL — ABNORMAL HIGH (ref 70–99)
Glucose-Capillary: 193 mg/dL — ABNORMAL HIGH (ref 70–99)
Glucose-Capillary: 194 mg/dL — ABNORMAL HIGH (ref 70–99)
Glucose-Capillary: 195 mg/dL — ABNORMAL HIGH (ref 70–99)
Glucose-Capillary: 199 mg/dL — ABNORMAL HIGH (ref 70–99)
Glucose-Capillary: 200 mg/dL — ABNORMAL HIGH (ref 70–99)

## 2019-08-17 LAB — SUSCEPTIBILITY RESULT

## 2019-08-17 LAB — SUSCEPTIBILITY, AER + ANAEROB

## 2019-08-17 MED ORDER — THIAMINE HCL 100 MG/ML IJ SOLN
250.0000 mg | INTRAVENOUS | Status: AC
  Administered 2019-08-20 – 2019-08-24 (×5): 250 mg via INTRAVENOUS
  Filled 2019-08-17 (×5): qty 2.5

## 2019-08-17 MED ORDER — THIAMINE HCL 100 MG/ML IJ SOLN
500.0000 mg | Freq: Three times a day (TID) | INTRAVENOUS | Status: AC
  Administered 2019-08-17 – 2019-08-20 (×9): 500 mg via INTRAVENOUS
  Filled 2019-08-17 (×10): qty 5

## 2019-08-17 MED ORDER — CYANOCOBALAMIN 1000 MCG/ML IJ SOLN
1000.0000 ug | Freq: Once | INTRAMUSCULAR | Status: AC
  Administered 2019-08-17: 1000 ug via INTRAMUSCULAR
  Filled 2019-08-17 (×2): qty 1

## 2019-08-17 MED ORDER — THIAMINE HCL 100 MG PO TABS
100.0000 mg | ORAL_TABLET | Freq: Every day | ORAL | Status: DC
  Administered 2019-08-25 – 2019-09-18 (×25): 100 mg
  Filled 2019-08-17 (×25): qty 1

## 2019-08-17 MED ORDER — HALOPERIDOL LACTATE 5 MG/ML IJ SOLN
1.0000 mg | INTRAMUSCULAR | Status: DC | PRN
  Administered 2019-08-17: 22:00:00 2 mg via INTRAVENOUS
  Administered 2019-08-18: 1 mg via INTRAVENOUS
  Administered 2019-08-18 – 2019-08-20 (×2): 2 mg via INTRAVENOUS
  Administered 2019-08-28: 03:00:00 3 mg via INTRAVENOUS
  Administered 2019-09-02: 4 mg via INTRAVENOUS
  Filled 2019-08-17 (×6): qty 1

## 2019-08-17 MED ORDER — FOLIC ACID 5 MG/ML IJ SOLN
1.0000 mg | Freq: Every day | INTRAMUSCULAR | Status: DC
  Administered 2019-08-17 – 2019-08-27 (×11): 1 mg via INTRAVENOUS
  Filled 2019-08-17 (×14): qty 0.2

## 2019-08-17 MED ORDER — VITAMIN B-12 1000 MCG PO TABS
1000.0000 ug | ORAL_TABLET | Freq: Every day | ORAL | Status: DC
  Administered 2019-08-18 – 2019-09-18 (×32): 1000 ug
  Filled 2019-08-17 (×32): qty 1

## 2019-08-17 MED ORDER — SODIUM CHLORIDE 0.9 % IV SOLN
2.0000 g | Freq: Three times a day (TID) | INTRAVENOUS | Status: AC
  Administered 2019-08-17 – 2019-08-27 (×32): 2 g via INTRAVENOUS
  Filled 2019-08-17 (×35): qty 2
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
## 2019-08-17 NOTE — Progress Notes (Signed)
Patient's daughter, Jeannetta Nap, called and updated at approximately 2030 08/16/2019. Patient's wife video chatted with patient at approximately 2300 08/16/2019.

## 2019-08-17 NOTE — Progress Notes (Signed)
Patient's daughter, Jeannetta Nap, called. Update given. Miranda requested to be able to [facility] sometime today after 1700.

## 2019-08-17 NOTE — Progress Notes (Signed)
Facetimed with daughter, Jeannetta Nap. Gave update, answered questions, and allowed her time to see/talk to her dad.

## 2019-08-18 DIAGNOSIS — U071 COVID-19: Secondary | ICD-10-CM | POA: Diagnosis not present

## 2019-08-18 DIAGNOSIS — J80 Acute respiratory distress syndrome: Secondary | ICD-10-CM | POA: Diagnosis not present

## 2019-08-18 DIAGNOSIS — J9600 Acute respiratory failure, unspecified whether with hypoxia or hypercapnia: Secondary | ICD-10-CM

## 2019-08-18 DIAGNOSIS — G9340 Encephalopathy, unspecified: Secondary | ICD-10-CM | POA: Diagnosis not present

## 2019-08-18 DIAGNOSIS — J9601 Acute respiratory failure with hypoxia: Secondary | ICD-10-CM

## 2019-08-18 LAB — GLUCOSE, CAPILLARY
Glucose-Capillary: 135 mg/dL — ABNORMAL HIGH (ref 70–99)
Glucose-Capillary: 142 mg/dL — ABNORMAL HIGH (ref 70–99)
Glucose-Capillary: 149 mg/dL — ABNORMAL HIGH (ref 70–99)
Glucose-Capillary: 152 mg/dL — ABNORMAL HIGH (ref 70–99)
Glucose-Capillary: 153 mg/dL — ABNORMAL HIGH (ref 70–99)
Glucose-Capillary: 157 mg/dL — ABNORMAL HIGH (ref 70–99)
Glucose-Capillary: 158 mg/dL — ABNORMAL HIGH (ref 70–99)
Glucose-Capillary: 163 mg/dL — ABNORMAL HIGH (ref 70–99)
Glucose-Capillary: 164 mg/dL — ABNORMAL HIGH (ref 70–99)
Glucose-Capillary: 165 mg/dL — ABNORMAL HIGH (ref 70–99)
Glucose-Capillary: 170 mg/dL — ABNORMAL HIGH (ref 70–99)
Glucose-Capillary: 171 mg/dL — ABNORMAL HIGH (ref 70–99)
Glucose-Capillary: 173 mg/dL — ABNORMAL HIGH (ref 70–99)
Glucose-Capillary: 174 mg/dL — ABNORMAL HIGH (ref 70–99)
Glucose-Capillary: 186 mg/dL — ABNORMAL HIGH (ref 70–99)
Glucose-Capillary: 187 mg/dL — ABNORMAL HIGH (ref 70–99)
Glucose-Capillary: 189 mg/dL — ABNORMAL HIGH (ref 70–99)
Glucose-Capillary: 190 mg/dL — ABNORMAL HIGH (ref 70–99)

## 2019-08-18 MED ORDER — TERAZOSIN HCL 5 MG PO CAPS
5.0000 mg | ORAL_CAPSULE | Freq: Every day | ORAL | Status: DC
  Administered 2019-08-19 – 2019-09-08 (×22): 5 mg
  Filled 2019-08-18 (×23): qty 1

## 2019-08-18 MED ORDER — DEXTROSE-NACL 5-0.45 % IV SOLN
Freq: Once | INTRAVENOUS | Status: AC
  Administered 2019-08-19: via INTRAVENOUS

## 2019-08-18 NOTE — Progress Notes (Signed)
Pt's wife called very rudely stating she did not know pt was being transferred tonight to [facility]. Updated pt's wife on transport and reasoning. Also emphasized procedure will not be done tonight. Educated wife on rationale for pt's transport to [facility] instead of [facility].   This RN also retrieved pt's belongings from security and it is being transported with [facility].  [facility] is here to pick patient up at this time.  Margaret Pyle, RN

## 2019-08-18 NOTE — Progress Notes (Signed)
eLink Physician-Brief Progress Note Patient Name: [name] DOB: 03-02-1951 MRN: [id number]   Date of Service  08/18/2019  HPI/Events of Note  Tube feed held for transfer and will continue to be held since patient is for trach in am.  eICU Interventions  Ordered to start D5 1/2 NS at 50 /hr     Intervention Category Minor Interventions: Other:  Judd Lien 08/18/2019, 10:29 PM

## 2019-08-18 NOTE — Progress Notes (Signed)
Wife updated on plan of care regarding transfer to [facility].  Patient is planning for trach 12/4, MRI and needs ongoing neuro evaluation.  Will transfer to [facility].  Receiving team notified of transfer.  Wife indicates understanding.     Noe Gens, MSN, NP-C [facility] 08/18/2019, 2:51 PM   Please see [email or web address] for pager details.

## 2019-08-18 NOTE — Progress Notes (Signed)
Pt arrived with belongings from [facility] via [facility] transport. Pt transfertred to bed and placed on vent without incident. VS stable. Will continue to monitor

## 2019-08-18 NOTE — Progress Notes (Signed)
Patient was picked up by [facility] for transport to [facility]. Hand off report given to [facility] RN. This RN assisted in transfer of patient to [facility]. All safety protocols observed. Patient's belongings including  Lemonade and jewelry   packed up and given to [facility] RN. Patient vitals were stable.

## 2019-08-18 NOTE — Progress Notes (Signed)
Patient's daughter called for updates on patient's status. Rn informed daughter about pending transfer to [facility]. Daughter was aware and knew the reason for the transfer. The room and phone number of the unit he is transferring to was provided to her. All questions answered.

## 2019-08-18 NOTE — Progress Notes (Signed)
Patient is very restless and agitated, moving head left to right vigorously, lifting arms and dropping them, RR is 38, O2 sats 88-90%, patient does open eyes to voice, unable to orient to situation, 2 mg of Haldol given.
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
## 2019-08-19 ENCOUNTER — Encounter (HOSPITAL_COMMUNITY): Admission: EM | Disposition: E | Payer: Self-pay | Source: Home / Self Care | Attending: Internal Medicine

## 2019-08-19 ENCOUNTER — Inpatient Hospital Stay (HOSPITAL_COMMUNITY): Payer: Medicare Other | Admitting: Anesthesiology

## 2019-08-19 ENCOUNTER — Inpatient Hospital Stay (HOSPITAL_COMMUNITY): Payer: Medicare Other

## 2019-08-19 DIAGNOSIS — Z960 Presence of urogenital implants: Secondary | ICD-10-CM

## 2019-08-19 DIAGNOSIS — J90 Pleural effusion, not elsewhere classified: Secondary | ICD-10-CM

## 2019-08-19 DIAGNOSIS — Z89511 Acquired absence of right leg below knee: Secondary | ICD-10-CM

## 2019-08-19 DIAGNOSIS — J9601 Acute respiratory failure with hypoxia: Secondary | ICD-10-CM | POA: Diagnosis not present

## 2019-08-19 DIAGNOSIS — J9610 Chronic respiratory failure, unspecified whether with hypoxia or hypercapnia: Secondary | ICD-10-CM | POA: Diagnosis not present

## 2019-08-19 DIAGNOSIS — J1289 Other viral pneumonia: Secondary | ICD-10-CM | POA: Diagnosis not present

## 2019-08-19 DIAGNOSIS — G9340 Encephalopathy, unspecified: Secondary | ICD-10-CM | POA: Diagnosis not present

## 2019-08-19 DIAGNOSIS — U071 COVID-19: Secondary | ICD-10-CM | POA: Diagnosis not present

## 2019-08-19 HISTORY — PX: TRACHEOSTOMY TUBE PLACEMENT: SHX814

## 2019-08-19 LAB — GLUCOSE, CAPILLARY
Glucose-Capillary: 180 mg/dL — ABNORMAL HIGH (ref 70–99)
Glucose-Capillary: 166 mg/dL — ABNORMAL HIGH (ref 70–99)
Glucose-Capillary: 154 mg/dL — ABNORMAL HIGH (ref 70–99)
Glucose-Capillary: 165 mg/dL — ABNORMAL HIGH (ref 70–99)
Glucose-Capillary: 149 mg/dL — ABNORMAL HIGH (ref 70–99)
Glucose-Capillary: 166 mg/dL — ABNORMAL HIGH (ref 70–99)
Glucose-Capillary: 171 mg/dL — ABNORMAL HIGH (ref 70–99)
Glucose-Capillary: 162 mg/dL — ABNORMAL HIGH (ref 70–99)
Glucose-Capillary: 139 mg/dL — ABNORMAL HIGH (ref 70–99)
Glucose-Capillary: 142 mg/dL — ABNORMAL HIGH (ref 70–99)
Glucose-Capillary: 155 mg/dL — ABNORMAL HIGH (ref 70–99)
Glucose-Capillary: 176 mg/dL — ABNORMAL HIGH (ref 70–99)
Glucose-Capillary: 195 mg/dL — ABNORMAL HIGH (ref 70–99)
Glucose-Capillary: 186 mg/dL — ABNORMAL HIGH (ref 70–99)
Glucose-Capillary: 169 mg/dL — ABNORMAL HIGH (ref 70–99)
Glucose-Capillary: 179 mg/dL — ABNORMAL HIGH (ref 70–99)
Glucose-Capillary: 175 mg/dL — ABNORMAL HIGH (ref 70–99)
Glucose-Capillary: 168 mg/dL — ABNORMAL HIGH (ref 70–99)

## 2019-08-19 LAB — HEPATIC FUNCTION PANEL
ALT: 45 U/L — ABNORMAL HIGH (ref 0–44)
AST: 19 U/L (ref 15–41)
Albumin: 1.6 g/dL — ABNORMAL LOW (ref 3.5–5.0)
Alkaline Phosphatase: 82 U/L (ref 38–126)
Bilirubin, Direct: 0.2 mg/dL (ref 0.0–0.2)
Indirect Bilirubin: 0.3 mg/dL (ref 0.3–0.9)
Total Bilirubin: 0.5 mg/dL (ref 0.3–1.2)
Total Protein: 4.8 g/dL — ABNORMAL LOW (ref 6.5–8.1)

## 2019-08-19 LAB — METHYLMALONIC ACID, SERUM: Methylmalonic Acid, Quantitative: 5538 nmol/L — ABNORMAL HIGH (ref 0–378)

## 2019-08-19 SURGERY — CREATION, TRACHEOSTOMY
Anesthesia: General

## 2019-08-19 MED ORDER — DOCUSATE SODIUM 100 MG PO CAPS: 100.0000 mg | ORAL_CAPSULE | Freq: Every day | ORAL | Status: DC

## 2019-08-19 MED ORDER — DEXAMETHASONE SODIUM PHOSPHATE 10 MG/ML IJ SOLN
INTRAMUSCULAR | Status: DC | PRN
  Administered 2019-08-19: 10 mg via INTRAVENOUS

## 2019-08-19 MED ORDER — DEXTROSE-NACL 5-0.45 % IV SOLN
INTRAVENOUS | Status: DC
  Administered 2019-08-20 – 2019-08-21 (×2): via INTRAVENOUS

## 2019-08-19 MED ORDER — PROPOFOL 10 MG/ML IV BOLUS
INTRAVENOUS | Status: AC
  Filled 2019-08-19: qty 20

## 2019-08-19 MED ORDER — PROMETHAZINE HCL 25 MG PO TABS: 25.0000 mg | ORAL_TABLET | Freq: Three times a day (TID) | ORAL | Status: DC | PRN

## 2019-08-19 MED ORDER — DIPHENHYDRAMINE HCL 25 MG PO CAPS: 25.0000 mg | ORAL_CAPSULE | Freq: Three times a day (TID) | ORAL | Status: DC | PRN

## 2019-08-19 MED ORDER — TRAZODONE HCL 50 MG PO TABS: 100.0000 mg | ORAL_TABLET | Freq: Every day | ORAL | Status: DC

## 2019-08-19 MED ORDER — ATORVASTATIN CALCIUM 10 MG PO TABS: 10.0000 mg | ORAL_TABLET | Freq: Every day | ORAL | Status: DC

## 2019-08-19 MED ORDER — LIDOCAINE-EPINEPHRINE 1 %-1:100000 IJ SOLN
INTRAMUSCULAR | Status: DC | PRN
  Administered 2019-08-19: 3 mL

## 2019-08-19 MED ORDER — FUROSEMIDE 40 MG PO TABS: 40.0000 mg | ORAL_TABLET | Freq: Every day | ORAL | Status: DC

## 2019-08-19 MED ORDER — LOSARTAN POTASSIUM 25 MG PO TABS: 25.0000 mg | ORAL_TABLET | Freq: Every day | ORAL | Status: DC

## 2019-08-19 MED ORDER — LEVOCETIRIZINE DIHYDROCHLORIDE 5 MG PO TABS: 5.0000 mg | ORAL_TABLET | Freq: Every day | ORAL | Status: DC | PRN

## 2019-08-19 MED ORDER — LORAZEPAM 0.5 MG PO TABS: 0.5000 mg | ORAL_TABLET | Freq: Three times a day (TID) | ORAL | Status: DC | PRN

## 2019-08-19 MED ORDER — PANTOPRAZOLE SODIUM 40 MG PO TBEC: 40.0000 mg | DELAYED_RELEASE_TABLET | Freq: Two times a day (BID) | ORAL | Status: DC

## 2019-08-19 MED ORDER — ONDANSETRON 4 MG PO TBDP: 4.0000 mg | ORAL_TABLET | Freq: Two times a day (BID) | ORAL | Status: DC | PRN

## 2019-08-19 MED ORDER — MIDAZOLAM HCL 2 MG/2ML IJ SOLN
INTRAMUSCULAR | Status: AC
  Filled 2019-08-19: qty 2

## 2019-08-19 MED ORDER — VITAL 1.5 CAL PO LIQD
1000.0000 mL | ORAL | Status: AC
  Administered 2019-08-19 – 2019-08-29 (×12): 1000 mL
  Filled 2019-08-19 (×15): qty 1000

## 2019-08-19 MED ORDER — LACTATED RINGERS IV SOLN
INTRAVENOUS | Status: DC | PRN
  Administered 2019-08-19: 12:00:00 via INTRAVENOUS

## 2019-08-19 MED ORDER — PROPOFOL 500 MG/50ML IV EMUL
INTRAVENOUS | Status: DC | PRN
  Administered 2019-08-19: 50 ug/kg/min via INTRAVENOUS

## 2019-08-19 MED ORDER — OXYCODONE HCL 10 MG PO TABS: 10.0000 mg | ORAL_TABLET | Freq: Four times a day (QID) | ORAL | Status: DC

## 2019-08-19 MED ORDER — ONDANSETRON HCL 4 MG/2ML IJ SOLN
INTRAMUSCULAR | Status: DC | PRN
  Administered 2019-08-19: 4 mg via INTRAVENOUS

## 2019-08-19 MED ORDER — GLUCOSE BLOOD VI STRP: 1.0000 | ORAL_STRIP | Status: DC | PRN

## 2019-08-19 MED ORDER — ALBUTEROL SULFATE HFA 108 (90 BASE) MCG/ACT IN AERS
1.0000 | INHALATION_SPRAY | Freq: Four times a day (QID) | RESPIRATORY_TRACT | Status: DC | PRN
  Filled 2019-08-19: qty 6.7

## 2019-08-19 MED ORDER — INSULIN REGULAR HUMAN (CONC) 500 UNIT/ML ~~LOC~~ SOPN: 0.0000 [IU] | PEN_INJECTOR | Freq: Three times a day (TID) | SUBCUTANEOUS | Status: DC

## 2019-08-19 MED ORDER — MUPIROCIN 2 % EX OINT: 1.0000 "application " | TOPICAL_OINTMENT | Freq: Two times a day (BID) | CUTANEOUS | Status: DC

## 2019-08-19 MED ORDER — CHLORHEXIDINE GLUCONATE CLOTH 2 % EX PADS
6.0000 | MEDICATED_PAD | Freq: Every day | CUTANEOUS | Status: DC
  Administered 2019-08-20 – 2019-08-22 (×4): 6 via TOPICAL

## 2019-08-19 MED ORDER — ROCURONIUM BROMIDE 10 MG/ML (PF) SYRINGE
PREFILLED_SYRINGE | INTRAVENOUS | Status: DC | PRN
  Administered 2019-08-19: 80 mg via INTRAVENOUS

## 2019-08-19 MED ORDER — TAMSULOSIN HCL 0.4 MG PO CAPS: 0.4000 mg | ORAL_CAPSULE | Freq: Every day | ORAL | Status: DC

## 2019-08-19 MED ORDER — LINACLOTIDE 72 MCG PO CAPS: 72.0000 ug | ORAL_CAPSULE | Freq: Every day | ORAL | Status: DC

## 2019-08-19 MED ORDER — FENTANYL CITRATE (PF) 250 MCG/5ML IJ SOLN
INTRAMUSCULAR | Status: AC
  Filled 2019-08-19: qty 5

## 2019-08-19 MED ORDER — TIZANIDINE HCL 2 MG PO TABS: 2.0000 mg | ORAL_TABLET | Freq: Three times a day (TID) | ORAL | Status: DC | PRN

## 2019-08-19 MED ORDER — ACETAMINOPHEN 500 MG PO TABS: 500.0000 mg | ORAL_TABLET | Freq: Four times a day (QID) | ORAL | Status: DC | PRN

## 2019-08-19 MED ORDER — FENTANYL CITRATE (PF) 100 MCG/2ML IJ SOLN
INTRAMUSCULAR | Status: DC | PRN
  Administered 2019-08-19: 50 ug via INTRAVENOUS
  Administered 2019-08-19: 100 ug via INTRAVENOUS

## 2019-08-19 SURGICAL SUPPLY — 41 items
BLADE CLIPPER SURG (BLADE) IMPLANT
BLADE SURG 15 STRL LF DISP TIS (BLADE) IMPLANT
BLADE SURG 15 STRL SS (BLADE)
CANISTER SUCT 3000ML PPV (MISCELLANEOUS) ×3 IMPLANT
CLEANER TIP ELECTROSURG 2X2 (MISCELLANEOUS) ×3 IMPLANT
COVER SURGICAL LIGHT HANDLE (MISCELLANEOUS) ×3 IMPLANT
COVER WAND RF STERILE (DRAPES) ×3 IMPLANT
DECANTER SPIKE VIAL GLASS SM (MISCELLANEOUS) ×3 IMPLANT
DRAPE HALF SHEET 40X57 (DRAPES) IMPLANT
ELECT COATED BLADE 2.86 ST (ELECTRODE) ×3 IMPLANT
ELECT REM PT RETURN 9FT ADLT (ELECTROSURGICAL) ×3
ELECTRODE REM PT RTRN 9FT ADLT (ELECTROSURGICAL) ×1 IMPLANT
GAUZE 4X4 16PLY RFD (DISPOSABLE) IMPLANT
GAUZE XEROFORM 5X9 LF (GAUZE/BANDAGES/DRESSINGS) ×2 IMPLANT
GLOVE BIOGEL M 7.0 STRL (GLOVE) ×6 IMPLANT
GOWN STRL REUS W/ TWL LRG LVL3 (GOWN DISPOSABLE) ×3 IMPLANT
GOWN STRL REUS W/TWL LRG LVL3 (GOWN DISPOSABLE) ×9
HOLDER TRACH TUBE VELCRO 19.5 (MISCELLANEOUS) IMPLANT
KIT BASIN OR (CUSTOM PROCEDURE TRAY) ×3 IMPLANT
KIT SUCTION CATH 14FR (SUCTIONS) IMPLANT
KIT TURNOVER KIT B (KITS) ×3 IMPLANT
NDL HYPO 25GX1X1/2 BEV (NEEDLE) ×1 IMPLANT
NEEDLE HYPO 25GX1X1/2 BEV (NEEDLE) ×3 IMPLANT
NS IRRIG 1000ML POUR BTL (IV SOLUTION) ×3 IMPLANT
PAD ARMBOARD 7.5X6 YLW CONV (MISCELLANEOUS) ×6 IMPLANT
PENCIL BUTTON HOLSTER BLD 10FT (ELECTRODE) ×3 IMPLANT
SPONGE INTESTINAL PEANUT (DISPOSABLE) IMPLANT
SUT CHROMIC 2 0 SH (SUTURE) ×3 IMPLANT
SUT ETHILON 2 0 FS 18 (SUTURE) ×6 IMPLANT
SUT SILK 2 0 PERMA HAND 18 BK (SUTURE) ×3 IMPLANT
SUT SILK 3 0 REEL (SUTURE) ×3 IMPLANT
SYR 20ML LL LF (SYRINGE) ×3 IMPLANT
SYR BULB IRRIGATION 50ML (SYRINGE) IMPLANT
SYR CONTROL 10ML LL (SYRINGE) ×3 IMPLANT
TOWEL GREEN STERILE FF (TOWEL DISPOSABLE) ×3 IMPLANT
TRAY ENT MC OR (CUSTOM PROCEDURE TRAY) ×3 IMPLANT
TUBE CONNECTING 12'X1/4 (SUCTIONS) ×1
TUBE CONNECTING 12X1/4 (SUCTIONS) ×2 IMPLANT
TUBE TRACH SHILEY  6 DIST  CUF (TUBING) ×2 IMPLANT
TUBE TRACH SHILEY 8 DIST CUF (TUBING) IMPLANT
WATER STERILE IRR 1000ML POUR (IV SOLUTION) ×3 IMPLANT

## 2019-08-19 NOTE — Progress Notes (Signed)
ANTICOAGULATION CONSULT NOTE - Follow Up Consult  Pharmacy Consult for Enoxaparin Indication: atrial fibrillation  Allergies  Allergen Reactions  . Bactrim [Sulfamethoxazole-Trimethoprim] Other (See Comments)    Blisters in his mouth  . Budesonide-Formoterol Fumarate Other (See Comments)    Hyperglycemia  Hyperglycemia   . Advair Diskus [Fluticasone-Salmeterol] Other (See Comments)    hyperglycemia  . Hydromorphone Hives, Itching and Nausea And Vomiting  . Pulmicort [Budesonide] Other (See Comments)    Hyperglycemia   . Morphine Itching  . Soap Itching and Other (See Comments)    Other Reaction: ivory soap=itching     Patient Measurements: Height: 6\' 1"  (185.4 cm) Weight: 251 lb 8.7 oz (114.1 kg) IBW/kg (Calculated) : 79.9 Heparin Dosing Weight: 103 kg  Vital Signs: Temp: 99.8 F (37.7 C) (12/04 0811) Temp Source: Oral (12/04 0811) BP: 140/56 (12/04 0824) Pulse Rate: 95 (12/04 0824)  Labs: Recent Labs    08/17/19 0625  CREATININE 0.73    Estimated Creatinine Clearance: 120.3 mL/min (by C-G formula based on SCr of 0.73 mg/dL).   Medications:  Infusions:  . aztreonam 2 g (09/05/2019 0801)  . ceftazidime avibactam (AVYCAZ) IVPB 25 mL/hr at 09/12/2019 0800  . insulin 2.8 Units/hr (08/26/2019 0919)  . thiamine injection 500 mg (09/08/2019 0928)   Followed by  . [START ON 08/20/2019] thiamine injection      Assessment: 68 y/o Mon SQ Lovenox for Afib RVR. H/H low and decreased further to 7.9, Plt wnl at 204 (last 12/1). No s/s bleeding reported.  SCr stable with CrCl > 100 ml/min.  Lovenox held 12/3 PM for planned trach 12/4 at [facility].  Goal of Therapy:  Anti-Xa level 0.6-1 units/ml 4hrs after LMWH dose given Monitor platelets by anticoagulation protocol: Yes   Plan:  F/u resume Lovenox 1 mg/kg [place] q12h s/p trach Monitor renal function, CBC, s/s bleeding.  Elicia Lamp, PharmD, BCPS Please check AMION for all [facility] contact numbers Clinical  Pharmacist 08/29/2019 9:59 AM

## 2019-08-19 NOTE — Progress Notes (Signed)
Dr. Constance Holster, ENT notified of pt's cuff leak. [name] will be changed tomorrow as long as patient remains stable throughout the night.  All supplies ordered and at bedside. If pt has any issues will notify Dr. Constance Holster and he will come in.  Will continue to monitor patient closely.

## 2019-08-19 NOTE — Progress Notes (Signed)
Assisted tele visit to patient with wife.  [name], [name], RN

## 2019-08-19 NOTE — Progress Notes (Signed)
RT note: patient transported to MRI, OR, and back to room [id number] without complications.  Will work on obtaining bedside trach supplies.  Will continue to monitor.

## 2019-08-19 NOTE — Progress Notes (Signed)
Pharmacy Antibiotic Note  [name] is a 68 y.o. male admitted on 07/27/2019 with pneumonia.  Patient is currently on Avycaz and Aztreonam per ID for repeat trach aspirate growing pseudomonas, stenotrophomonas (R to Levaquin and Bactrim) and serratia.  Today is day #7 of combination Avycaz and Aztreonam. WBC wnl. Low grade fevers. SCr stable wnl.  Plan: - Continue Aztreonam 2 gm IV Q 8 hours - Continue Avycaz 2.5 gm IV Q 8 hours per ID  - Monitor CBC, renal fx, cultures and clinical progress  - F/u ID plans - previously planning for 7 day course  Height: 6\' 1"  (185.4 cm) Weight: 251 lb 8.7 oz (114.1 kg) IBW/kg (Calculated) : 79.9  Temp (24hrs), Avg:100.9 F (38.3 C), Min:99.8 F (37.7 C), Max:102.2 F (39 C)  Recent Labs  Lab 08/13/19 0258 08/14/19 0604 08/15/19 0504 08/16/19 0436 08/17/19 0625  WBC  --   --   --  7.4  --   CREATININE 1.11 0.97 0.94 0.77 0.73    Estimated Creatinine Clearance: 120.3 mL/min (by C-G formula based on SCr of 0.73 mg/dL).    Allergies  Allergen Reactions  . Bactrim [Sulfamethoxazole-Trimethoprim] Other (See Comments)    Blisters in his mouth  . Budesonide-Formoterol Fumarate Other (See Comments)    Hyperglycemia  Hyperglycemia   . Advair Diskus [Fluticasone-Salmeterol] Other (See Comments)    hyperglycemia  . Hydromorphone Hives, Itching and Nausea And Vomiting  . Pulmicort [Budesonide] Other (See Comments)    Hyperglycemia   . Morphine Itching  . Soap Itching and Other (See Comments)    Other Reaction: ivory soap=itching     Antimicrobials this admission: 11/19 Remdesivir >> 11/23 11/19 Ceftriaxone >> 11/23 11/19 Azithromycin >> 11/23 11/20 Vancomycin >>11/22 11/26 Zosyn >> 11/28 11/26 Levaquin >> 11/28 11/28 Avycaz>> 11/28 Aztreonam>>   Microbiology results: 11/11 Covid + 11/19 UCx: insignificant growth 11/19 BCx: 1/3 Coag neg staph 11/21 MRSA PCR: neg 11/20 TA >> Few pseudomonas / few stenotrophomonas  11/25  TA >> Mod pseudomonax / mod stenotrophomonas / moderate serratia    Elicia Lamp, PharmD, BCPS Please check AMION for all [place] contact numbers Clinical Pharmacist 09/03/2019 10:02 AM
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
## 2019-08-19 NOTE — Anesthesia Postprocedure Evaluation (Signed)
Anesthesia Post Note  Patient: [name]  Procedure(s) Performed: TRACHEOSTOMY (N/A )     Patient location during evaluation: SICU Anesthesia Type: General Level of consciousness: sedated Pain management: pain level controlled Vital Signs Assessment: post-procedure vital signs reviewed and stable Respiratory status: patient remains intubated per anesthesia plan Cardiovascular status: stable Postop Assessment: no apparent nausea or vomiting Anesthetic complications: no    Last Vitals:  Vitals:   08/29/2019 1359 09/11/2019 1400  BP:  (!) 94/56  Pulse: 100 90  Resp: (!) 26 (!) 26  Temp: 37.1 C   SpO2: 96% 96%    Last Pain:  Vitals:   08/20/2019 1359  TempSrc: Oral  PainSc:                  Rihan Schueler DANIEL

## 2019-08-19 NOTE — Progress Notes (Addendum)
eLink Physician-Brief Progress Note Patient Name: [name] DOB: 11/04/1950 MRN: [id number]   Date of Service  08/18/2019  HPI/Events of Note  Asked to camera in due to cuff leak. Appears to be generating adequate volumes.  eICU Interventions  Will get CXR for tonight to confirm proper position of new trach. ENT to be informed regarding leak.     Intervention Category Major Interventions: Airway management  Judd Lien 09/14/2019, 8:41 PM

## 2019-08-19 NOTE — Anesthesia Preprocedure Evaluation (Addendum)
Anesthesia Evaluation   Patient unresponsive    Reviewed: Allergy & Precautions, NPO status , Patient's Chart, lab work & pertinent test results  History of Anesthesia Complications (+) DIFFICULT AIRWAY  Airway Mallampati: Intubated       Dental   Pulmonary pneumonia, COPD,  COPD inhaler and oxygen dependent, former smoker,  07/27/2019 COVID positive 08/05/2019 intubated: easy view with VideoGlide     Pulmonary exam normal        Cardiovascular hypertension, Pt. on medications + Peripheral Vascular Disease  Normal cardiovascular exam  '18 Nuclear stress: EF 66%. no ST segment deviation noted during stress. The study is normal '17 ECHO: EF 65-70%, valves OK   Neuro/Psych Anxiety Depression TIA   GI/Hepatic GERD  ,  Endo/Other  diabetes, Insulin Dependent  Renal/GU      Musculoskeletal   Abdominal   Peds  Hematology  (+) Blood dyscrasia (Hb 7.9), anemia ,   Anesthesia Other Findings   Reproductive/Obstetrics                            Anesthesia Physical Anesthesia Plan  ASA: IV  Anesthesia Plan: General   Post-op Pain Management:    Induction: Intravenous  PONV Risk Score and Plan: 2 and Ondansetron, Dexamethasone and Treatment may vary due to age or medical condition  Airway Management Planned: Tracheostomy  Additional Equipment:   Intra-op Plan:   Post-operative Plan: Post-operative intubation/ventilation  Informed Consent: I have reviewed the patients History and Physical, chart, labs and discussed the procedure including the risks, benefits and alternatives for the proposed anesthesia with the patient or authorized representative who has indicated his/her understanding and acceptance.       Plan Discussed with: CRNA, Anesthesiologist and Surgeon  Anesthesia Plan Comments:        Anesthesia Quick Evaluation

## 2019-08-19 NOTE — Progress Notes (Signed)
   ENT Progress Note: Patient for  TRACHEOSTOMY REVISION   Subjective: Intubated and sedated  Objective: Vital signs in last 24 hours: Temp:  [100 F (37.8 C)-102.2 F (39 C)] 100 F (37.8 C) (12/04 0400) Pulse Rate:  [92-119] 101 (12/04 0600) Resp:  [13-37] 29 (12/04 0600) BP: (110-160)/(39-80) 137/55 (12/04 0600) SpO2:  [87 %-99 %] 97 % (12/04 0600) FiO2 (%):  [40 %-60 %] 60 % (12/04 0400) Weight:  [113.9 kg-114.1 kg] 114.1 kg (12/04 0500) Weight change: 2 kg Last BM Date: 08/18/19  Intake/Output from previous day: 12/03 0701 - 12/04 0700 In: 3113.4 [I.V.:147.3; [id number]:[id number]; IV Piggyback:621] Out: 2010 [Urine:1910; Stool:100] Intake/Output this shift: No intake/output data recorded.  Labs: No results for input(s): WBC, HGB, HCT, PLT in the last 72 hours. Recent Labs    08/17/19 0625  NA 143  K 3.9  CL 113*  CO2 25  GLUCOSE 172*  BUN 36*  CALCIUM 8.9    Studies/Results: No results found.   PHYSICAL EXAM: Prev tracheostomy    Assessment/Plan: Pt for revision tracheostomy    Jerrell Belfast 08/18/2019, 7:44 AM

## 2019-08-19 NOTE — Progress Notes (Signed)
Neurology Progress Note   S:// Patient seen and examined Transfer to [facility] for MRI as well as tracheostomy revision   O:// Current vital signs: BP (!) 140/56   Pulse 95   Temp 99.8 F (37.7 C) (Oral)   Resp (!) 27   Ht 6\' 1"  (1.854 m)   Wt 114.1 kg   SpO2 99%   BMI 33.19 kg/m  Vital signs in last 24 hours: Temp:  [99.8 F (37.7 C)-102.2 F (39 C)] 99.8 F (37.7 C) (12/04 0811) Pulse Rate:  [92-119] 95 (12/04 0824) Resp:  [13-37] 27 (12/04 0824) BP: (110-160)/(39-80) 140/56 (12/04 0824) SpO2:  [87 %-99 %] 99 % (12/04 0824) FiO2 (%):  [40 %-60 %] 60 % (12/04 0824) Weight:  [113.9 kg-114.1 kg] 114.1 kg (12/04 0500) General: Not in any sedation, intubated. HEENT: Cephalic atraumatic Lungs vented Neurological exam Lethargic, does not open eyes to voice or noxious stimulation.  Actually tries to keeps his eyes forcefully shut when attempting to examine. I did not appreciate any spontaneous movements He does not follow commands He is nonverbal Cranial nerves: Pupils are equal round reactive to light, difficult to assess due to forcibly closing eyes.  Difficult to appreciate facial asymmetry due to hair and tube Motor exam: On noxious stimulation moves all fours pretty symmetrically. Sensory exam: As above    Medications  Current Facility-Administered Medications:  .  acetaminophen (TYLENOL) 160 MG/5ML solution 650 mg, 650 mg, Per Tube, Q6H PRN, Anders Simmonds, MD, 650 mg at 08/18/19 2018 .  aztreonam (AZACTAM) 2 g in sodium chloride 0.9 % 100 mL IVPB, 2 g, Intravenous, Q8H, McQuaid, Douglas B, MD, Last Rate: 200 mL/hr at 09/01/2019 0801, 2 g at 09/12/2019 0801 .  bisacodyl (DULCOLAX) suppository 10 mg, 10 mg, Rectal, Daily PRN, Ollis, Brandi L, NP .  ceftazidime-avibactam (AVYCAZ) 2.5 g in dextrose 5 % 50 mL IVPB, 2.5 g, Intravenous, Q8H, Comer, Okey Regal, MD, Last Rate: 25 mL/hr at 09/14/2019 0800 .  chlorhexidine (PERIDEX) 0.12 % solution 15 mL, 15 mL, Mouth/Throat,  BID, McQuaid, Douglas B, MD, 15 mL at 08/26/2019 0803 .  Chlorhexidine Gluconate Cloth 2 % PADS 6 each, 6 each, Topical, Daily, Ollis, Brandi L, NP, 6 each at 08/18/19 0906 .  Crystal Light Powder (Potassium Citrate) , 1 packet, Per Tube, Daily, Juanito Doom, MD, 1 packet at 08/18/19 0910 .  dextrose 50 % solution 0-50 mL, 0-50 mL, Intravenous, PRN, Tyna Jaksch, MD .  dextrose 50 % solution 0-50 mL, 0-50 mL, Intravenous, PRN, Ogan, Okoronkwo U, MD .  diltiazem (CARDIZEM) 10 mg/ml oral suspension 90 mg, 90 mg, Per Tube, Q6H, Simonne Maffucci B, MD, 90 mg at 09/12/2019 0525 .  docusate (COLACE) 50 MG/5ML liquid 100 mg, 100 mg, Per Tube, BID PRN, Alfredo Martinez, Brandi L, NP, 100 mg at 08/05/19 2150 .  folic acid injection 1 mg, 1 mg, Intravenous, Daily, Ollis, Brandi L, NP, 1 mg at 09/09/2019 0929 .  free water 250 mL, 250 mL, Per Tube, Q2H, Julian Hy, DO, Stopped at 08/25/2019 0005 .  haloperidol lactate (HALDOL) injection 1-4 mg, 1-4 mg, Intravenous, Q3H PRN, Ollis, Brandi L, NP, 1 mg at 08/18/19 1338 .  insulin regular, human (MYXREDLIN) 100 units/ 100 mL infusion, , Intravenous, Continuous, Ogan, Okoronkwo U, MD, Last Rate: 3 mL/hr at 09/13/2019 1030, 3 Units/hr at 09/15/2019 1030 .  ipratropium-albuterol (DUONEB) 0.5-2.5 (3) MG/3ML nebulizer solution 3 mL, 3 mL, Nebulization, Q6H PRN, Mannam, Praveen, MD .  linagliptin (TRADJENTA) tablet 5 mg, 5 mg, Per Tube, Daily, Simonne Maffucci B, MD, 5 mg at 08/18/19 0917 .  loperamide HCl (IMODIUM) 1 MG/7.5ML suspension 2 mg, 2 mg, Per Tube, PRN, Simonne Maffucci B, MD, 2 mg at 08/17/19 1208 .  MEDLINE mouth rinse, 15 mL, Mouth Rinse, 10 times per day, Icard, Bradley L, DO, 15 mL at 08/27/2019 0927 .  metoprolol tartrate (LOPRESSOR) injection 2.5-5 mg, 2.5-5 mg, Intravenous, Q3H PRN, Ollis, Brandi L, NP, 5 mg at 08/11/19 1628 .  metoprolol tartrate (LOPRESSOR) tablet 25 mg, 25 mg, Per Tube, BID, Julian Hy, DO, 25 mg at 08/18/19 2215 .  multivitamin liquid  15 mL, 15 mL, Per Tube, Daily, Noemi Chapel P, DO, 15 mL at 08/18/19 0907 .  nutrition supplement (JUVEN) (JUVEN) powder packet 1 packet, 1 packet, Per Tube, BID, Juanito Doom, MD, 1 packet at 08/27/2019 0001 .  pantoprazole sodium (PROTONIX) 40 mg/20 mL oral suspension 40 mg, 40 mg, Per Tube, BID, Ollis, Brandi L, NP, 40 mg at 09/07/2019 0002 .  sodium chloride flush (NS) 0.9 % injection 10-40 mL, 10-40 mL, Intracatheter, Q12H, Icard, Bradley L, DO, 10 mL at 08/18/2019 0929 .  sodium chloride flush (NS) 0.9 % injection 10-40 mL, 10-40 mL, Intracatheter, PRN, Icard, Bradley L, DO .  terazosin (HYTRIN) capsule 5 mg, 5 mg, Per Tube, QHS, McQuaid, Douglas B, MD, 5 mg at 09/15/2019 0001 .  thiamine 500mg  in normal saline (37ml) IVPB, 500 mg, Intravenous, TID, Last Rate: 100 mL/hr at 08/16/2019 0928, 500 mg at 09/15/2019 0928 **FOLLOWED BY** [START ON 08/20/2019] thiamine (B-1) 250 mg in sodium chloride 0.9 % 50 mL IVPB, 250 mg, Intravenous, Q24H, Ollis, Brandi L, NP .  [START ON 08/25/2019] thiamine (VITAMIN B-1) tablet 100 mg, 100 mg, Per Tube, Daily, Ollis, Brandi L, NP .  vitamin B-12 (CYANOCOBALAMIN) tablet 1,000 mcg, 1,000 mcg, Per Tube, Daily, Ollis, Brandi L, NP, 1,000 mcg at 08/18/19 0907 Labs CBC    Component Value Date/Time   WBC 7.4 08/16/2019 0436   RBC 2.34 (L) 08/16/2019 0436   HGB 7.9 (L) 08/16/2019 0436   HCT 25.6 (L) 08/16/2019 0436   PLT 204 08/16/2019 0436   MCV 109.4 (H) 08/16/2019 0436   MCV 103.3 (A) 10/31/2015 1514   MCH 33.8 08/16/2019 0436   MCHC 30.9 08/16/2019 0436   RDW 15.2 08/16/2019 0436   LYMPHSABS 1.1 08/16/2019 0436   MONOABS 0.3 08/16/2019 0436   EOSABS 0.1 08/16/2019 0436   BASOSABS 0.0 08/16/2019 0436    CMP     Component Value Date/Time   NA 143 08/17/2019 0625   K 3.9 08/17/2019 0625   CL 113 (H) 08/17/2019 0625   CO2 25 08/17/2019 0625   GLUCOSE 172 (H) 08/17/2019 0625   GLUCOSE 402 (H) 09/28/2006 0925   BUN 36 (H) 08/17/2019 0625   CREATININE 0.73  08/17/2019 0625   CREATININE 0.86 04/17/2016 0929   CALCIUM 8.9 08/17/2019 0625   CALCIUM 9.4 03/03/2011 0926   PROT 6.1 (L) 08/09/2019 0602   ALBUMIN 1.8 (L) 08/09/2019 0602   AST 59 (H) 08/09/2019 0602   ALT 40 08/09/2019 0602   ALKPHOS 95 08/09/2019 0602   BILITOT 0.4 08/09/2019 0602   GFRNONAA >60 08/17/2019 0625   GFRNONAA >89 04/17/2016 0929   GFRAA >60 08/17/2019 0625   GFRAA >89 04/17/2016 0929     Imaging I have reviewed images in epic and the results pertinent to this consultation are: MRI brain pending  Assessment: 68 year old man with considerable past medical history of diabetes hypertension peripheral vascular disease with right BKA COPD bilateral mild carotid stenosis, diastolic CHF, esophageal stricture, gastroparesis with COVID-19 infection, neurological consultation obtained for prolonged encephalopathy. Patient has been afflicted with [id number] since early part of November, prior to presentation to the hospital, he had been lethargic and not really doing much and possibly hypoxemic for a few days.  He had required intubation and has remained intubated since arrival. Primary team was concerned about a underlying neurological process prior to chalking the symptoms up to multifactorial toxic metabolic encephalopathy. On my exam yesterday, he did appear focally weak on the right but today he appears to be moving all fours pretty symmetrically. I suspect that his current presentation is secondary to prolonged hospitalization, possibly vitamin deficiencies and COVID-19 infection-multifactorial toxic metabolic encephalopathy but given his comorbidities, investigating further to rule out a stroke would be prudent. No suspicion for ongoing seizures at this time.  Impression: Multifactorial toxic metabolic encephalopathy including COVID-19 encephalopathy, Wernicke's encephalopathy, hypoxemia Evaluate for hypoxic ischemic injury versus stroke  Recommendations: Continue  thiamine and B12 replenishment as you are Supportive care per primary team as you are MRI brain pending at this time-we will follow Continue to minimize sedating medications Neurology will follow up the imaging studies with the  Plan relayed to Dr. Chase Caller in the medical ICU at [facility] -- Amie Portland, MD [facility] Pager: [phone] If 7pm to 7am, please call on call as listed on AMION.  CRITICAL CARE ATTESTATION Performed by: Amie Portland, MD Total critical care time: 35 minutes Critical care time was exclusive of separately billable procedures and treating other patients and/or supervising APPs/Residents/Students Critical care was necessary to treat or prevent imminent or life-threatening deterioration due to multifactorial toxic metabolic encephalopathy This patient is critically ill and at significant risk for neurological worsening and/or death and care requires constant monitoring. Critical care was time spent personally by me on the following activities: development of treatment plan with patient and/or surrogate as well as nursing, discussions with consultants, evaluation of patient's response to treatment, examination of patient, obtaining history from patient or surrogate, ordering and performing treatments and interventions, ordering and review of laboratory studies, ordering and review of radiographic studies, pulse oximetry, re-evaluation of patient's condition, participation in multidisciplinary rounds and medical decision making of high complexity in the care of this patient.

## 2019-08-19 NOTE — Progress Notes (Signed)
eLink Physician-Brief Progress Note Patient Name: [name] DOB: Nov 14, 1950 MRN: [id number]   Date of Service  08/29/2019  HPI/Events of Note  Patient s/p tracheostomy earlier today without complications. He was placed NPO for procedure and continues to be on endotool.  eICU Interventions  Im not made aware of any contraindication for resumption of TF. Ordered to resume TF.Marland Kitchen     Intervention Category Intermediate Interventions: Other:  Judd Lien 08/20/2019, 7:41 PM

## 2019-08-19 NOTE — Progress Notes (Signed)
[facility] for Infectious Disease   Reason for visit: Follow up on MDRO PNA, s/p COVID  Antibiotics: Aztreonam, day 7 Avycaz, day 7  Interval History: Pt initially seen by Dr. Linus Salmons (see his initial consult note) from ID service over this past weekend. Re-consulted for persistent fevers in pt with CoVID-19, subsequent ARDS, and MDRO Stenotrophomonas, Pseudomonas, and Serratia PNA/VAP. Pt underwent tracheostomy placement today. He remains sedated/minimally intreractive on the vent post-operatively. Fever curve, WBC & Cr trends, imaging, cx results, and ABX usage all independently reviewed    Current Facility-Administered Medications:  .  acetaminophen (TYLENOL) 160 MG/5ML solution 650 mg, 650 mg, Per Tube, Q6H PRN, Anders Simmonds, MD, 650 mg at 08/18/19 2018 .  aztreonam (AZACTAM) 2 g in sodium chloride 0.9 % 100 mL IVPB, 2 g, Intravenous, Q8H, Juanito Doom, MD, Stopped at 09/08/2019 0831 .  bisacodyl (DULCOLAX) suppository 10 mg, 10 mg, Rectal, Daily PRN, Ollis, Brandi L, NP .  ceftazidime-avibactam (AVYCAZ) 2.5 g in dextrose 5 % 50 mL IVPB, 2.5 g, Intravenous, Q8H, Comer, Okey Regal, MD, Stopped at 08/20/2019 0800 .  chlorhexidine (PERIDEX) 0.12 % solution 15 mL, 15 mL, Mouth/Throat, BID, McQuaid, Douglas B, MD, 15 mL at 09/05/2019 0803 .  Chlorhexidine Gluconate Cloth 2 % PADS 6 each, 6 each, Topical, Daily, Ollis, Brandi L, NP, 6 each at 08/18/19 0906 .  Crystal Light Powder (Potassium Citrate) , 1 packet, Per Tube, Daily, Juanito Doom, MD, 1 packet at 08/18/19 0910 .  dextrose 50 % solution 0-50 mL, 0-50 mL, Intravenous, PRN, Tyna Jaksch, MD .  dextrose 50 % solution 0-50 mL, 0-50 mL, Intravenous, PRN, Ogan, Okoronkwo U, MD .  diltiazem (CARDIZEM) 10 mg/ml oral suspension 90 mg, 90 mg, Per Tube, Q6H, Simonne Maffucci B, MD, 90 mg at 09/01/2019 0525 .  docusate (COLACE) 50 MG/5ML liquid 100 mg, 100 mg, Per Tube, BID PRN, Alfredo Martinez, Brandi L, NP, 100 mg at 08/05/19 2150 .   folic acid injection 1 mg, 1 mg, Intravenous, Daily, Ollis, Brandi L, NP, 1 mg at 09/03/2019 0929 .  free water 250 mL, 250 mL, Per Tube, Q2H, Julian Hy, DO, Stopped at 08/29/2019 0005 .  haloperidol lactate (HALDOL) injection 1-4 mg, 1-4 mg, Intravenous, Q3H PRN, Ollis, Brandi L, NP, 1 mg at 08/18/19 1338 .  insulin regular, human (MYXREDLIN) 100 units/ 100 mL infusion, , Intravenous, Continuous, Ogan, Okoronkwo U, MD, Last Rate: 3 mL/hr at 09/05/2019 1030, 3 Units/hr at 08/28/2019 1030 .  ipratropium-albuterol (DUONEB) 0.5-2.5 (3) MG/3ML nebulizer solution 3 mL, 3 mL, Nebulization, Q6H PRN, Mannam, Praveen, MD .  linagliptin (TRADJENTA) tablet 5 mg, 5 mg, Per Tube, Daily, McQuaid, Douglas B, MD, 5 mg at 08/18/19 0917 .  loperamide HCl (IMODIUM) 1 MG/7.5ML suspension 2 mg, 2 mg, Per Tube, PRN, Simonne Maffucci B, MD, 2 mg at 08/17/19 1208 .  MEDLINE mouth rinse, 15 mL, Mouth Rinse, 10 times per day, Icard, Bradley L, DO, 15 mL at 08/20/2019 0927 .  metoprolol tartrate (LOPRESSOR) injection 2.5-5 mg, 2.5-5 mg, Intravenous, Q3H PRN, Ollis, Brandi L, NP, 5 mg at 08/11/19 1628 .  metoprolol tartrate (LOPRESSOR) tablet 25 mg, 25 mg, Per Tube, BID, Julian Hy, DO, 25 mg at 08/18/19 2215 .  multivitamin liquid 15 mL, 15 mL, Per Tube, Daily, Noemi Chapel P, DO, 15 mL at 08/18/19 0907 .  nutrition supplement (JUVEN) (JUVEN) powder packet 1 packet, 1 packet, Per Tube, BID, Juanito Doom, MD, 1  packet at 08/18/2019 0001 .  pantoprazole sodium (PROTONIX) 40 mg/20 mL oral suspension 40 mg, 40 mg, Per Tube, BID, Ollis, Brandi L, NP, 40 mg at 08/22/2019 0002 .  sodium chloride flush (NS) 0.9 % injection 10-40 mL, 10-40 mL, Intracatheter, Q12H, Icard, Bradley L, DO, 10 mL at 09/08/2019 0929 .  sodium chloride flush (NS) 0.9 % injection 10-40 mL, 10-40 mL, Intracatheter, PRN, Icard, Bradley L, DO .  terazosin (HYTRIN) capsule 5 mg, 5 mg, Per Tube, QHS, McQuaid, Douglas B, MD, 5 mg at 09/12/2019 0001 .  thiamine 500mg  in  normal saline (36ml) IVPB, 500 mg, Intravenous, TID, Stopped at 08/24/2019 0958 **FOLLOWED BY** [START ON 08/20/2019] thiamine (B-1) 250 mg in sodium chloride 0.9 % 50 mL IVPB, 250 mg, Intravenous, Q24H, Ollis, Brandi L, NP .  [START ON 08/25/2019] thiamine (VITAMIN B-1) tablet 100 mg, 100 mg, Per Tube, Daily, Ollis, Brandi L, NP .  vitamin B-12 (CYANOCOBALAMIN) tablet 1,000 mcg, 1,000 mcg, Per Tube, Daily, Ollis, Brandi L, NP, 1,000 mcg at 08/18/19 0907   Physical Exam:   Vitals:   09/14/2019 0900 09/04/2019 1000  BP: 139/66 133/62  Pulse: (!) 103 (!) 114  Resp: (!) 26 (!) 27  Temp:    SpO2: 98% 98%   Physical Exam Lines: +trach, RT arm midline, PIV, condom cath, NGT Gen: chronically ill, minimally interactive, mostly responds to only noxious stimuli Head: NCAT, no temporal wasting evident EENT: PERRL, EOMI, facial beard braided/poorly groomed Neck: +trach, supple, no JVD CV: tachycardic, RR, no murmurs appreciated Pulm:scant bibasilar crackles, no wheeze, occasional retractions on vent Abd: soft, NTND, +BS (hypoactive) Extrems: 2+ pitting LE edema, s/p RT BKA with bandage overlying stump site w/o surrounding erythema, 1+ pulses Skin: RT forearm skin tear w/o purulent drainage or erythema, adequate skin turgor Neuro: responds mostly only to noxious stimuli as struggles to maintain gaze  Review of Systems:  Review of Systems  Unable to perform ROS: Intubated     Lab Results  Component Value Date   WBC 7.4 08/16/2019   HGB 7.9 (L) 08/16/2019   HCT 25.6 (L) 08/16/2019   MCV 109.4 (H) 08/16/2019   PLT 204 08/16/2019    Lab Results  Component Value Date   CREATININE 0.73 08/17/2019   BUN 36 (H) 08/17/2019   NA 143 08/17/2019   K 3.9 08/17/2019   CL 113 (H) 08/17/2019   CO2 25 08/17/2019    Lab Results  Component Value Date   ALT 40 08/09/2019   AST 59 (H) 08/09/2019   ALKPHOS 95 08/09/2019     Microbiology: Recent Results (from the past 240 hour(s))  Culture,  respiratory (non-expectorated)     Status: None (Preliminary result)   Collection Time: 08/10/19  9:15 AM   Specimen: Tracheal Aspirate; Respiratory  Result Value Ref Range Status   Specimen Description   Final    TRACHEAL ASPIRATE Performed at [facility] [address], [place], [place] [address]    Special Requests   Final    Normal Performed at [facility], [place] [address], [place], [place] [address]    Gram Stain   Final    NO WBC SEEN FEW GRAM NEGATIVE RODS RARE BUDDING YEAST SEEN Performed at [facility], [place] [address], [place], [place] [address]    Culture   Final    MODERATE SERRATIA MARCESCENS MODERATE STENOTROPHOMONAS MALTOPHILIA MODERATE PSEUDOMONAS AERUGINOSA Sent to [facility] for further susceptibility testing. [place]  Report Status PENDING  Incomplete   Organism ID, Bacteria SERRATIA MARCESCENS  Final   Organism ID, Bacteria STENOTROPHOMONAS MALTOPHILIA  Final   Organism ID, Bacteria PSEUDOMONAS AERUGINOSA  Final      Susceptibility   Pseudomonas aeruginosa - MIC*    CEFTAZIDIME 4 SENSITIVE Sensitive     CIPROFLOXACIN >=4 RESISTANT Resistant     GENTAMICIN 4 SENSITIVE Sensitive     IMIPENEM >=16 RESISTANT Resistant     PIP/TAZO 8 SENSITIVE Sensitive     CEFEPIME 4 SENSITIVE Sensitive     * MODERATE PSEUDOMONAS AERUGINOSA   Stenotrophomonas maltophilia - MIC*    LEVOFLOXACIN >=8 RESISTANT Resistant     TRIMETH/SULFA >=320 RESISTANT Resistant     * MODERATE STENOTROPHOMONAS MALTOPHILIA   Serratia marcescens - MIC*    CEFAZOLIN >=64 RESISTANT Resistant     CEFEPIME <=1 SENSITIVE Sensitive     CEFTAZIDIME <=1 SENSITIVE Sensitive     CEFTRIAXONE <=1 SENSITIVE Sensitive     CIPROFLOXACIN <=0.25 SENSITIVE Sensitive     GENTAMICIN <=1 SENSITIVE Sensitive     TRIMETH/SULFA <=20 SENSITIVE Sensitive     * MODERATE SERRATIA MARCESCENS  Susceptibility, Aer + Anaerob     Status: Abnormal    Collection Time: 08/10/19  9:15 AM  Result Value Ref Range Status   Suscept, Aer + Anaerob CANC (A)  Corrected    Comment: (NOTE) Test cancelled at client's request. Test cancelled per [facility] 08-17-2019 Performed At: [facility] [place], [place] [id number] Rush Farmer MD [id number] CORRECTED ON 12/02 AT 1036: PREVIOUSLY REPORTED AS Preliminary report    Source of Sample TRACHEAL ASPIRATE  Final    Comment: RESPIRATORY STENOTROPHOMONAS MALTOPHILIA REQUESTING SUSCEPTIBILITY ON TIGECYCLINE SUAANL [id number] Performed at [facility], [address] [place], [place] [address]   Susceptibility Result     Status: Abnormal   Collection Time: 08/10/19  9:15 AM  Result Value Ref Range Status   Suscept Result 1 Comment (A)  Final    Comment: (NOTE) Stenotrophomonas maltophilia Identification performed by account, not confirmed by this laboratory. Performed At: [facility] [place], [place] [id number] Rush Farmer MD [id number]   Min Inhibitory Conc (2 Drugs)     Status: None (Preliminary result)   Collection Time: 08/10/19  9:15 AM  Result Value Ref Range Status   Min Inhibitory Conc (2 Drugs) Preliminary report  Final    Comment: (NOTE) Performed At: [facility] [place], [place] [id number] Rush Farmer MD [id number]    Source ([place]) PENDING  Incomplete  MIC Results (2 Drugs)     Status: None   Collection Time: 08/10/19  9:15 AM  Result Value Ref Range Status   RESULT 5 MIC (RESULT 1)                Base Name: Comment  Final    Comment: (NOTE) Stenotrophomonas maltophilia Identification performed by account, not confirmed by this laboratory. There are no established standards for interpretation of this organism-drug combination. Performed At: [facility] [place], [place] [id number] Rush Farmer MD [id number]     Impression/Plan: The patient is a  68 year old white male diabetic status post right BKA admitted for Covid-19 with subsequent ARDS and multidrug-resistant pneumonia/VAP, questionable Covid associated encephalopathy, and relapsing fever.  1.  VAP -the patient had a tracheal aspirate on August 10, 2019 that showed multidrug-resistant Stenotrophomonas as well as Pseudomonas aeruginosa and Serratia marcescens.  He has now  received 6 and half days of synergistic treatment with Avycaz and aztreonam for treatment primarily of his stenotrophomonas but these medications will also concurrently treat his Pseudomonas and Serratia isolate as well.  The patient has rather chronic pulmonary findings on imaging which is difficult to exclude from his underlying ordered's/Covid pneumonia.  We are still awaiting results of the patient's synergistic kill curve analyses at this time.  As stenotrophomonas and Pseudomonas are often excluded from trials with a shortened course therapy for Vapne, I would favor continuing both Avycaz and aztreonam at least until the patient's kill curve results have returned and possibly even continuing at least Avycaz monotherapy to complete 14 days in total as is often standard for both Pseudomonas and stenotrophomonas.  Now that the patient has had a tracheostomy any further sputum samples must be interpreted with some skepticism as his lungs will no longer be viewed as a sterile site as his mucous membrane has been directly pierced with his tracheostomy apparatus.  If the patient's fever persists in conjunction with worsening O2 requirements on the ventilator, one therapeutic consideration/consolidation could be the use of cefiderocol.  2. Fever - likely multifactorial given CoVID-19 dx and recent presumed VAP. Check blood cxs x 2 for fever > 100.5 to exclude BSI or line infection. Check Doppler U/S of extremities to R/O DVT given chronic immobility since admission to [facility] as patient's recent fever trend  of persistent fever, arguing for a fixed/stable cause. No eosinophilia noted on WBC differentials (recent decadron may be blunting this response from mounting though) but one consideration may be pyrogenic effect of dual beta-lactam tx, so may consider stopping aztreonam if fails to improve. Unclear if CoVID-19 itself is causing pt's fever either.  3.  CoVID-19 - He initially tested positive on 07/27/2019. He received remdesevir, actemra, and decadron all as part of his CoVID-19 tx. Will defer isolation status decisions to Dr. Baxter Flattery who is covering this weekend.  32 minutes of critical care time spent   1.
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
## 2019-08-19 NOTE — Transfer of Care (Addendum)
Immediate Anesthesia Transfer of Care Note  Patient: [name]  Procedure(s) Performed: TRACHEOSTOMY (N/A )  Patient Location: ICU  Anesthesia Type:General  Level of Consciousness: sedated  Airway & Oxygen Therapy: Patient remains intubated per anesthesia plan and Patient placed on Ventilator (see vital sign flow sheet for setting)  Post-op Assessment: Report given to RN and Post -op Vital signs reviewed and stable  Post vital signs: Reviewed and stable  Last Vitals:  Vitals Value Taken Time  BP 94/56 08/20/2019 1400  Temp 37.1 C 09/01/2019 1359  Pulse 90 08/24/2019 1414  Resp 26 09/04/2019 1414  SpO2 97 % 09/08/2019 1414  Vitals shown include unvalidated device data.  Last Pain:  Vitals:   09/14/2019 1359  TempSrc: Oral  PainSc:          Complications: No apparent anesthesia complications

## 2019-08-19 NOTE — Progress Notes (Signed)
Elink made aware of patient having a cuff leak. RT at bedside. Cuff leak still heard after air inserted. Fio2 increased to 100%. Pt resting comfortably in bed. Will continue to monitor.

## 2019-08-20 DIAGNOSIS — U071 COVID-19: Secondary | ICD-10-CM | POA: Diagnosis not present

## 2019-08-20 DIAGNOSIS — J1289 Other viral pneumonia: Secondary | ICD-10-CM | POA: Diagnosis not present

## 2019-08-20 DIAGNOSIS — G9340 Encephalopathy, unspecified: Secondary | ICD-10-CM | POA: Diagnosis not present

## 2019-08-20 DIAGNOSIS — J9601 Acute respiratory failure with hypoxia: Secondary | ICD-10-CM | POA: Diagnosis not present

## 2019-08-20 LAB — CBC WITH DIFFERENTIAL/PLATELET
Abs Immature Granulocytes: 0.05 10*3/uL (ref 0.00–0.07)
Basophils Absolute: 0 10*3/uL (ref 0.0–0.1)
Basophils Relative: 0 %
Eosinophils Absolute: 0 10*3/uL (ref 0.0–0.5)
Eosinophils Relative: 0 %
HCT: 21.6 % — ABNORMAL LOW (ref 39.0–52.0)
Hemoglobin: 6.7 g/dL — CL (ref 13.0–17.0)
Immature Granulocytes: 1 %
Lymphocytes Relative: 13 %
Lymphs Abs: 0.8 10*3/uL (ref 0.7–4.0)
MCH: 33.8 pg (ref 26.0–34.0)
MCHC: 31 g/dL (ref 30.0–36.0)
MCV: 109.1 fL — ABNORMAL HIGH (ref 80.0–100.0)
Monocytes Absolute: 0.2 10*3/uL (ref 0.1–1.0)
Monocytes Relative: 4 %
Neutro Abs: 5.1 10*3/uL (ref 1.7–7.7)
Neutrophils Relative %: 82 %
Platelets: 229 10*3/uL (ref 150–400)
RBC: 1.98 MIL/uL — ABNORMAL LOW (ref 4.22–5.81)
RDW: 15 % (ref 11.5–15.5)
WBC: 6.2 10*3/uL (ref 4.0–10.5)
nRBC: 0 % (ref 0.0–0.2)

## 2019-08-20 LAB — GLUCOSE, CAPILLARY
Glucose-Capillary: 140 mg/dL — ABNORMAL HIGH (ref 70–99)
Glucose-Capillary: 143 mg/dL — ABNORMAL HIGH (ref 70–99)
Glucose-Capillary: 154 mg/dL — ABNORMAL HIGH (ref 70–99)
Glucose-Capillary: 155 mg/dL — ABNORMAL HIGH (ref 70–99)
Glucose-Capillary: 157 mg/dL — ABNORMAL HIGH (ref 70–99)
Glucose-Capillary: 158 mg/dL — ABNORMAL HIGH (ref 70–99)
Glucose-Capillary: 159 mg/dL — ABNORMAL HIGH (ref 70–99)
Glucose-Capillary: 160 mg/dL — ABNORMAL HIGH (ref 70–99)
Glucose-Capillary: 174 mg/dL — ABNORMAL HIGH (ref 70–99)
Glucose-Capillary: 177 mg/dL — ABNORMAL HIGH (ref 70–99)
Glucose-Capillary: 187 mg/dL — ABNORMAL HIGH (ref 70–99)
Glucose-Capillary: 187 mg/dL — ABNORMAL HIGH (ref 70–99)
Glucose-Capillary: 206 mg/dL — ABNORMAL HIGH (ref 70–99)
Glucose-Capillary: 209 mg/dL — ABNORMAL HIGH (ref 70–99)
Glucose-Capillary: 210 mg/dL — ABNORMAL HIGH (ref 70–99)
Glucose-Capillary: 211 mg/dL — ABNORMAL HIGH (ref 70–99)

## 2019-08-20 LAB — PROTIME-INR
INR: 1.1 (ref 0.8–1.2)
Prothrombin Time: 14.5 seconds (ref 11.4–15.2)

## 2019-08-20 LAB — BASIC METABOLIC PANEL
Anion gap: 5 (ref 5–15)
BUN: 38 mg/dL — ABNORMAL HIGH (ref 8–23)
CO2: 27 mmol/L (ref 22–32)
Calcium: 9.4 mg/dL (ref 8.9–10.3)
Chloride: 110 mmol/L (ref 98–111)
Creatinine, Ser: 0.66 mg/dL (ref 0.61–1.24)
GFR calc Af Amer: >60 mL/min (ref >60)
GFR calc non Af Amer: >60 mL/min (ref >60)
Glucose, Bld: 188 mg/dL — ABNORMAL HIGH (ref 70–99)
Potassium: 4.5 mmol/L (ref 3.5–5.1)
Sodium: 142 mmol/L (ref 135–145)

## 2019-08-20 LAB — PREPARE RBC (CROSSMATCH)

## 2019-08-20 LAB — PHOSPHORUS: Phosphorus: 3.1 mg/dL (ref 2.5–4.6)

## 2019-08-20 LAB — MAGNESIUM: Magnesium: 2.4 mg/dL (ref 1.7–2.4)

## 2019-08-20 LAB — ABO/RH: ABO/RH(D): O NEG

## 2019-08-20 MED ORDER — SODIUM CHLORIDE 0.9% IV SOLUTION
Freq: Once | INTRAVENOUS | Status: AC
  Administered 2019-08-27: 20:00:00 via INTRAVENOUS

## 2019-08-20 NOTE — Progress Notes (Signed)
Patient ID: [name], male   DOB: 1951/05/09, 68 y.o.   MRN: [id number]  Having trouble through the night with keeping the cuff inflated.  Able to ventilate and oxygenate well until this morning when started having difficulty keeping saturations adequate.  I was able to remove the tracheostomy tube that had an obvious cuff leak and replace it with a fresh #6 cuffed trach tube.  I removed the Xeroform from around the stoma site.  There was no bleeding.  I was able to replace the tracheostomy with minimal difficulty.  Ventilations were much improved and oxygen saturations returned to normal levels.  Respiratory therapy was in the room during the procedure.  We were prepared to intubate orally if necessary.  Will follow-up as needed.

## 2019-08-20 NOTE — Progress Notes (Signed)
eLink Physician-Brief Progress Note Patient Name: [name] DOB: [birth year] MRN: [id number]   Date of Service  08/20/2019  HPI/Events of Note  Notified of Hgb 6.7 from 7.9. No signs of active bleed.  eICU Interventions  Ordered to transfuse 1 unit PRBC     Intervention Category Intermediate Interventions: Bleeding - evaluation and treatment with blood products  Judd Lien 08/20/2019, 5:52 AM

## 2019-08-20 NOTE — Progress Notes (Signed)
[facility]ious [facility]    Date of Admission:  07/17/2019   Total days of antibiotics 8 of ceftaz and aztreonam          ID: [name] is a 68 y.o. male with  Severe critical illness due to covid-19 c/b bacterial VAP - with pseudomonas and stenotrophomonas. Requiring trach POD #1 Principal Problem:   Pneumonia due to COVID-19 virus Active Problems:   ANEMIA, IRON DEFICIENCY   CAD (coronary artery disease)   COPD, severe (HCC)   Gastroesophageal reflux disease with hiatal hernia   Diabetes mellitus type 2 in obese ([place])   COVID-19   ARDS (adult respiratory distress syndrome) (HCC)   Pressure injury of skin   Acute respiratory failure with hypoxemia (HCC)   Acute encephalopathy    Subjective: Afebrile, some trach secretions, remains sedated  Medications:  . sodium chloride   Intravenous Once  . chlorhexidine  15 mL Mouth/Throat BID  . Chlorhexidine Gluconate Cloth  6 each Topical Daily  . Crystal Light Powder (Potassium Citrate)   1 packet Per Tube Daily  . diltiazem  90 mg Per Tube [id number]  . folic acid  1 mg Intravenous Daily  . free water  250 mL Per Tube Q2H  . linagliptin  5 mg Per Tube Daily  . mouth rinse  15 mL Mouth Rinse 10 times per day  . metoprolol tartrate  25 mg Per Tube BID  . multivitamin  15 mL Per Tube Daily  . nutrition supplement (JUVEN)  1 packet Per Tube BID  . pantoprazole sodium  40 mg Per Tube BID  . sodium chloride flush  10-40 mL Intracatheter Q12H  . terazosin  5 mg Per Tube QHS  . [START ON 08/25/2019] thiamine  100 mg Per Tube Daily  . vitamin B-12  1,000 mcg Per Tube Daily    Objective: Vital signs in last 24 hours: Temp:  [98.2 F (36.8 C)-100.5 F (38.1 C)] 98.5 F (36.9 C) (12/05 1106) Pulse Rate:  [80-113] 112 (12/05 1106) Resp:  [16-34] 23 (12/05 1106) BP: (94-152)/(47-88) 136/59 (12/05 1106) SpO2:  [91 %-100 %] 93 % (12/05 1106) FiO2 (%):  [60 %-100 %] 60 % (12/05 0906) Weight:  [115.1 kg] 115.1 kg  (12/05 0403) Physical Exam  Constitutional: . He appears well-developed and well-nourished. No distress.  HENT: trach + Mouth/Throat: Oropharynx is clear and dry. No oropharyngeal exudate.  Cardiovascular: Normal rate, regular rhythm and normal heart sounds. Exam reveals no gallop and no friction rub.  No murmur heard.  Pulmonary/Chest: Effort normal and breath sounds normal. No respiratory distress. He has no wheezes.  Abdominal: Soft. Bowel sounds are decreased. He exhibits no distension. There is no tenderness.  Ext: right bka healed, +1 edema   Lab Results Recent Labs    08/20/19 0007 08/20/19 0445  WBC  --  6.2  HGB  --  6.7*  HCT  --  21.6*  NA 142  --   K 4.5  --   CL 110  --   CO2 27  --   BUN 38*  --   CREATININE 0.66  --    Liver Panel Recent Labs    09/03/2019 1407  PROT 4.8*  ALBUMIN 1.6*  AST 19  ALT 45*  ALKPHOS 82  BILITOT 0.5  BILIDIR 0.2  IBILI 0.3    Microbiology: 11/25 serratia, steno, PsA -trach aspirate  Serratia marcescens Stenotrophomonas maltophilia    MIC MIC  CEFAZOLIN >=64 RESIST... Resistant      CEFEPIME <=1 SENSITIVE  Sensitive      CEFTAZIDIME <=1 SENSITIVE  Sensitive      CEFTRIAXONE <=1 SENSITIVE  Sensitive      CIPROFLOXACIN <=0.25 SENS... Sensitive      GENTAMICIN <=1 SENSITIVE  Sensitive      LEVOFLOXACIN   >=8 RESISTANT  Resistant    TRIMETH/SULFA <=20 SENSIT... Sensitive >=320 RESIS... Resistant           Susceptibility   Pseudomonas aeruginosa    MIC    CEFEPIME 4 SENSITIVE  Sensitive    CEFTAZIDIME 4 SENSITIVE  Sensitive    CIPROFLOXACIN >=4 RESISTANT  Resistant    GENTAMICIN 4 SENSITIVE  Sensitive    IMIPENEM >=16 RESIST... Resistant    PIP/TAZO 8 SENSITIVE  Sensitive    Studies/Results: Mr Brain Wo Contrast  Result Date: 08/17/2019 CLINICAL DATA:  Prolonged encephalopathy, COVID EXAM: MRI HEAD WITHOUT CONTRAST TECHNIQUE: Multiplanar, multiecho pulse sequences of the brain and surrounding structures were  obtained without intravenous contrast. COMPARISON:  MRI 2017 FINDINGS: Motion artifact is present. Brain: There is no acute infarction or intracranial hemorrhage. Prominence of the ventricles and sulci reflects generalized parenchymal volume loss. Disproportionate ventricular prominence is slightly increased. Patchy and confluent areas of T2 hyperintensity in the supratentorial white are nonspecific but may reflect mild to moderate chronic microvascular ischemic changes. There is no intracranial mass, mass effect, or extra-axial collection. Vascular: Major vessel flow voids at the skull base are preserved. Skull and upper cervical spine: Marrow signal is unremarkable. Sinuses/Orbits: Nonspecific retained secretions in the paranasal sinuses. Bilateral lens replacements. Other: Nonspecific bilateral mastoid effusions. IMPRESSION: Motion degraded study. No evidence of recent infarction or hemorrhage. Slightly increased disproportionate ventricular prominence since 2017 which may reflect increased central volume loss or less likely communicating hydrocephalus. Electronically Signed   By: Macy Mis M.D.   On: 09/01/2019 13:15   Dg Chest Port 1 View  Result Date: 08/20/2019 CLINICAL DATA:  Tracheostomy EXAM: PORTABLE CHEST 1 VIEW COMPARISON:  Portable exam 2109 hours compared to 08/16/2019 FINDINGS: Tracheostomy tube projects over tracheal air column above aortic arch. Feeding tube extends into stomach. Enlargement of cardiac silhouette. Diffuse BILATERAL pulmonary infiltrates RIGHT greater than LEFT, least affecting the LEFT upper lobe, not significantly changed from prior study. No pleural effusion or pneumothorax. IMPRESSION: Persistent BILATERAL pulmonary infiltrates consistent with multifocal pneumonia. Electronically Signed   By: Lavonia Dana M.D.   On: 08/18/2019 22:11     Assessment/Plan: MDRO VAP with serratia, pseudomonas, and steno last isolated = planning for 14d course of treatment, has been on  day 8 ceftazidime-avibactam/aztreonam combination  Respiratory failure vent dependent = now on trach to see if can help minimize need for sedation  Severe critical illness due to covid-19 = protracted course received numerous agents, including remdesivir, steroids, c/b ARDS  IDDM = continues on insulin drip  [facility] Cell: [phone] Pager: [phone]  08/20/2019, 11:43 AM

## 2019-08-20 NOTE — Progress Notes (Signed)
Dr. Constance Holster notified of patient no holding his volumes. MD coming to bedside to change out trach. Will continue to monitor patient

## 2019-08-20 NOTE — Progress Notes (Signed)
Assisted tele visit to patient with wife.  Cyla Haluska P, RN  
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
## 2019-08-20 NOTE — Progress Notes (Signed)
Pt has had slow steady leak in trach cuff since 2120 and RN and MD notified and aware. Also had to increase FIO2 to maintain pts SATS above 90%. Now pt unable to hold air in cuff and maintain steady volumes. MD and RN aware. MD on the way to change Trach.

## 2019-08-20 NOTE — Progress Notes (Signed)
Dr. Constance Holster at bedside to change out patients #6 shiley d/t cuff leak. RT and primary nurse at bedside. Patient tolerated procedure well. Sats 96%. No leak noticed. Will continue to monitor.

## 2019-08-20 NOTE — Progress Notes (Signed)
CRITICAL VALUE ALERT  Critical Value:  HGB 6.7  Date & Time Notied:  08/20/19  0541  Provider Notified: Warren Lacy   Orders Received/Actions taken: Awaiting

## 2019-08-20 NOTE — Progress Notes (Signed)
Assisted tele visit to patient with daughter. ° °Ghina Bittinger P, RN  °

## 2019-08-21 DIAGNOSIS — G9340 Encephalopathy, unspecified: Secondary | ICD-10-CM | POA: Diagnosis not present

## 2019-08-21 DIAGNOSIS — J1289 Other viral pneumonia: Secondary | ICD-10-CM | POA: Diagnosis not present

## 2019-08-21 DIAGNOSIS — U071 COVID-19: Secondary | ICD-10-CM | POA: Diagnosis not present

## 2019-08-21 DIAGNOSIS — J9601 Acute respiratory failure with hypoxia: Secondary | ICD-10-CM | POA: Diagnosis not present

## 2019-08-21 LAB — CBC WITH DIFFERENTIAL/PLATELET
Abs Immature Granulocytes: 0.07 10*3/uL (ref 0.00–0.07)
Basophils Absolute: 0 10*3/uL (ref 0.0–0.1)
Basophils Relative: 0 %
Eosinophils Absolute: 0.1 10*3/uL (ref 0.0–0.5)
Eosinophils Relative: 1 %
HCT: 24.4 % — ABNORMAL LOW (ref 39.0–52.0)
Hemoglobin: 8 g/dL — ABNORMAL LOW (ref 13.0–17.0)
Immature Granulocytes: 1 %
Lymphocytes Relative: 16 %
Lymphs Abs: 1.1 10*3/uL (ref 0.7–4.0)
MCH: 33.5 pg (ref 26.0–34.0)
MCHC: 32.8 g/dL (ref 30.0–36.0)
MCV: 102.1 fL — ABNORMAL HIGH (ref 80.0–100.0)
Monocytes Absolute: 0.4 10*3/uL (ref 0.1–1.0)
Monocytes Relative: 6 %
Neutro Abs: 5.1 10*3/uL (ref 1.7–7.7)
Neutrophils Relative %: 76 %
Platelets: 243 10*3/uL (ref 150–400)
RBC: 2.39 MIL/uL — ABNORMAL LOW (ref 4.22–5.81)
RDW: 16.5 % — ABNORMAL HIGH (ref 11.5–15.5)
WBC: 6.7 10*3/uL (ref 4.0–10.5)
nRBC: 0.4 % — ABNORMAL HIGH (ref 0.0–0.2)

## 2019-08-21 LAB — COMPREHENSIVE METABOLIC PANEL
ALT: 74 U/L — ABNORMAL HIGH (ref 0–44)
AST: 47 U/L — ABNORMAL HIGH (ref 15–41)
Albumin: 1.7 g/dL — ABNORMAL LOW (ref 3.5–5.0)
Alkaline Phosphatase: 84 U/L (ref 38–126)
Anion gap: 7 (ref 5–15)
BUN: 28 mg/dL — ABNORMAL HIGH (ref 8–23)
CO2: 26 mmol/L (ref 22–32)
Calcium: 9.5 mg/dL (ref 8.9–10.3)
Chloride: 108 mmol/L (ref 98–111)
Creatinine, Ser: 0.6 mg/dL — ABNORMAL LOW (ref 0.61–1.24)
GFR calc Af Amer: >60 mL/min (ref >60)
GFR calc non Af Amer: >60 mL/min (ref >60)
Glucose, Bld: 171 mg/dL — ABNORMAL HIGH (ref 70–99)
Potassium: 4.2 mmol/L (ref 3.5–5.1)
Sodium: 141 mmol/L (ref 135–145)
Total Bilirubin: 0.3 mg/dL (ref 0.3–1.2)
Total Protein: 5 g/dL — ABNORMAL LOW (ref 6.5–8.1)

## 2019-08-21 LAB — GLUCOSE, CAPILLARY
Glucose-Capillary: 141 mg/dL — ABNORMAL HIGH (ref 70–99)
Glucose-Capillary: 147 mg/dL — ABNORMAL HIGH (ref 70–99)
Glucose-Capillary: 155 mg/dL — ABNORMAL HIGH (ref 70–99)
Glucose-Capillary: 158 mg/dL — ABNORMAL HIGH (ref 70–99)
Glucose-Capillary: 159 mg/dL — ABNORMAL HIGH (ref 70–99)
Glucose-Capillary: 161 mg/dL — ABNORMAL HIGH (ref 70–99)
Glucose-Capillary: 161 mg/dL — ABNORMAL HIGH (ref 70–99)
Glucose-Capillary: 162 mg/dL — ABNORMAL HIGH (ref 70–99)
Glucose-Capillary: 163 mg/dL — ABNORMAL HIGH (ref 70–99)
Glucose-Capillary: 167 mg/dL — ABNORMAL HIGH (ref 70–99)
Glucose-Capillary: 170 mg/dL — ABNORMAL HIGH (ref 70–99)
Glucose-Capillary: 173 mg/dL — ABNORMAL HIGH (ref 70–99)
Glucose-Capillary: 179 mg/dL — ABNORMAL HIGH (ref 70–99)
Glucose-Capillary: 184 mg/dL — ABNORMAL HIGH (ref 70–99)
Glucose-Capillary: 195 mg/dL — ABNORMAL HIGH (ref 70–99)

## 2019-08-21 LAB — TYPE AND SCREEN
ABO/RH(D): O NEG
Antibody Screen: NEGATIVE
Unit division: 0

## 2019-08-21 LAB — MAGNESIUM: Magnesium: 2 mg/dL (ref 1.7–2.4)

## 2019-08-21 LAB — CBC
HCT: 25.4 % — ABNORMAL LOW (ref 39.0–52.0)
Hemoglobin: 8.3 g/dL — ABNORMAL LOW (ref 13.0–17.0)
MCH: 33.2 pg (ref 26.0–34.0)
MCHC: 32.7 g/dL (ref 30.0–36.0)
MCV: 101.6 fL — ABNORMAL HIGH (ref 80.0–100.0)
Platelets: 250 10*3/uL (ref 150–400)
RBC: 2.5 MIL/uL — ABNORMAL LOW (ref 4.22–5.81)
RDW: 16.5 % — ABNORMAL HIGH (ref 11.5–15.5)
WBC: 9.4 10*3/uL (ref 4.0–10.5)
nRBC: 0.2 % (ref 0.0–0.2)

## 2019-08-21 LAB — BPAM RBC
Blood Product Expiration Date: 202012092359
ISSUE DATE / TIME: 202012050839
Unit Type and Rh: 9500

## 2019-08-21 LAB — PHOSPHORUS: Phosphorus: 2.4 mg/dL — ABNORMAL LOW (ref 2.5–4.6)

## 2019-08-21 LAB — MIC RESULTS (2 DRUGS)

## 2019-08-21 MED ORDER — FREE WATER
200.0000 mL | Freq: Four times a day (QID) | Status: DC
  Administered 2019-08-21 – 2019-08-31 (×39): 200 mL

## 2019-08-21 MED ORDER — GLYCOPYRROLATE 1 MG PO TABS
1.0000 mg | ORAL_TABLET | Freq: Three times a day (TID) | ORAL | Status: DC
  Administered 2019-08-21 – 2019-09-06 (×50): 1 mg
  Filled 2019-08-21 (×49): qty 1

## 2019-08-21 MED ORDER — HEPARIN (PORCINE) 25000 UT/250ML-% IV SOLN
2650.0000 [IU]/h | INTRAVENOUS | Status: DC
  Administered 2019-08-21: 1500 [IU]/h via INTRAVENOUS
  Administered 2019-08-22: 2000 [IU]/h via INTRAVENOUS
  Administered 2019-08-22: 1500 [IU]/h via INTRAVENOUS
  Administered 2019-08-23: 20:00:00 2400 [IU]/h via INTRAVENOUS
  Administered 2019-08-23: 09:00:00 2000 [IU]/h via INTRAVENOUS
  Filled 2019-08-21 (×6): qty 250

## 2019-08-21 NOTE — Plan of Care (Signed)
Follow up on prior note  MRI brain completed 09/06/2019-unremarkable for acute process.  Possibly slightly increased communicating hydrocephalus in comparison to prior scans.  Patient continues to be severely encephalopathic.  Is being followed by infectious diseases specialists with still continuing treatment for the VAP.  From a neurological standpoint, he has prolonged encephalopathy from COVID-19 pneumonia vent dependent respiratory failure, and multiple other medical issues including volume overload, sacral decubitus ulcer, anemia of critical illness, history of diabetes, hypertension, presence of COPD and off-and-on diarrhea-and will require extended time for any improvement if that were to happen once all of the above issues are corrected.    At this time it is not possible to provide a prognostication but given his lengthy list of comorbidities and prolonged affliction with his current pneumonia and other medical issues, his recovery will be protracted and I am unable to ascertain at this point as to the extent of his recovery, from a neurological standpoint.  If the family wishes full scope of care, then that should be pursued.  He might require prolonged stay in a nursing home that can support ventilation/tracheostomy care.  One last bit of investigation from a neurological standpoint, is an EEG, to r/o electrographic abnormalities - I have ordered that for Monday. We will follow up on that once completed.  Neurology will be available as needed should you have questions.  -- Amie Portland, MD [facility] Pager: [phone] If 7pm to 7am, please call on call as listed on AMION.

## 2019-08-21 NOTE — Progress Notes (Signed)
Assisted tele visit to patient with family member.  Woodrow Drab P, RN  

## 2019-08-21 NOTE — Progress Notes (Signed)
Assisted tele visit to patient with wife.  Jessie Cowher P, RN  
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
## 2019-08-21 NOTE — Progress Notes (Signed)
ANTICOAGULATION CONSULT NOTE - Follow Up Consult  Pharmacy Consult for Enoxaparin Indication: atrial fibrillation  Allergies  Allergen Reactions  . Bactrim [Sulfamethoxazole-Trimethoprim] Other (See Comments)    Blisters in his mouth  . Budesonide-Formoterol Fumarate Other (See Comments)    Hyperglycemia  Hyperglycemia   . Advair Diskus [Fluticasone-Salmeterol] Other (See Comments)    hyperglycemia  . Hydromorphone Hives, Itching and Nausea And Vomiting  . Pulmicort [Budesonide] Other (See Comments)    Hyperglycemia   . Morphine Itching  . Soap Itching and Other (See Comments)    Other Reaction: ivory soap=itching     Patient Measurements: Height: 6\' 1"  (185.4 cm) Weight: 260 lb 12.9 oz (118.3 kg) IBW/kg (Calculated) : 79.9 Heparin Dosing Weight: 103 kg  Vital Signs: Temp: 99.5 F (37.5 C) (12/06 0400) Temp Source: Axillary (12/06 0400) BP: 142/66 (12/06 1053) Pulse Rate: 107 (12/06 1053)  Labs: Recent Labs    08/20/19 0007  08/20/19 0445 08/20/19 1359 08/21/19 0448  HGB  --    < > 6.7* 8.3* 8.0*  HCT  --   --  21.6* 25.4* 24.4*  PLT  --   --  229 250 243  LABPROT  --   --  14.5  --   --   INR  --   --  1.1  --   --   CREATININE 0.66  --   --   --  0.60*   < > = values in this interval not displayed.    Estimated Creatinine Clearance: 122.4 mL/min (A) (by C-G formula based on SCr of 0.6 mg/dL (L)).  Assessment: 68 y/o Mwith severe covid. Developed afib - was on enoxaparin at [place]  Now at [facility] s/p trach  To resume AC with IV hep today  Goal of Therapy:  Heparin level 0.3 - 0.5 units/ml Monitor platelets by anticoagulation protocol: Yes   Plan:  Start IV hep 1500 units/hr F/U with ENT Monday if ok to resume enox Initial HL 2200 Daily HL CBC  Barth Kirks, PharmD, BCPS, BCCCP Clinical Pharmacist [phone]  Please check AMION for all [place] numbers  08/21/2019 1:21 PM

## 2019-08-21 NOTE — Progress Notes (Signed)
ID PROGRESS NOTE  ID: 68yo M with severe critical illness related to covid-19. ARDS with prolonged vent wean s/p trach, being treated for MDRO VAP, encephalopathy, healing right BKA  - afebrile, but increased secretion through trach  A/P:  MDRO VAP = continue on ceftazidime-avibactam plus aztreonam. If secretions change in feature, more purulent, consider repeat culture. Plan to treat for longer course of 10-14d.  Encephalopathy associated with critical illness = suspect multifactorial, sepsis, and severe COVID illness. Had recent MRI on 12/4 showing possible increase hydrocephaly. Neurology getting EEG tomorrow to r/o NCSE   Dr Prince Rome to see tomorrow.

## 2019-08-21 NOTE — Progress Notes (Signed)
Called for copious oral secretions Plan Will try robinul   Erick Colace ACNP-BC [place] Pager # [phone] OR # [phone] if no answer

## 2019-08-22 ENCOUNTER — Inpatient Hospital Stay (HOSPITAL_COMMUNITY): Payer: Medicare Other

## 2019-08-22 ENCOUNTER — Encounter (HOSPITAL_COMMUNITY): Payer: Self-pay | Admitting: Otolaryngology

## 2019-08-22 DIAGNOSIS — J1289 Other viral pneumonia: Secondary | ICD-10-CM | POA: Diagnosis not present

## 2019-08-22 DIAGNOSIS — U071 COVID-19: Secondary | ICD-10-CM | POA: Diagnosis not present

## 2019-08-22 LAB — BASIC METABOLIC PANEL
Anion gap: 7 (ref 5–15)
BUN: 26 mg/dL — ABNORMAL HIGH (ref 8–23)
CO2: 25 mmol/L (ref 22–32)
Calcium: 9.3 mg/dL (ref 8.9–10.3)
Chloride: 106 mmol/L (ref 98–111)
Creatinine, Ser: 0.59 mg/dL — ABNORMAL LOW (ref 0.61–1.24)
GFR calc Af Amer: >60 mL/min (ref >60)
GFR calc non Af Amer: >60 mL/min (ref >60)
Glucose, Bld: 163 mg/dL — ABNORMAL HIGH (ref 70–99)
Potassium: 4.4 mmol/L (ref 3.5–5.1)
Sodium: 138 mmol/L (ref 135–145)

## 2019-08-22 LAB — CBC WITH DIFFERENTIAL/PLATELET
Abs Immature Granulocytes: 0.04 10*3/uL (ref 0.00–0.07)
Basophils Absolute: 0 10*3/uL (ref 0.0–0.1)
Basophils Relative: 0 %
Eosinophils Absolute: 0 10*3/uL (ref 0.0–0.5)
Eosinophils Relative: 1 %
HCT: 25.7 % — ABNORMAL LOW (ref 39.0–52.0)
Hemoglobin: 8.1 g/dL — ABNORMAL LOW (ref 13.0–17.0)
Immature Granulocytes: 1 %
Lymphocytes Relative: 13 %
Lymphs Abs: 0.9 10*3/uL (ref 0.7–4.0)
MCH: 32.9 pg (ref 26.0–34.0)
MCHC: 31.5 g/dL (ref 30.0–36.0)
MCV: 104.5 fL — ABNORMAL HIGH (ref 80.0–100.0)
Monocytes Absolute: 0.5 10*3/uL (ref 0.1–1.0)
Monocytes Relative: 7 %
Neutro Abs: 5.3 10*3/uL (ref 1.7–7.7)
Neutrophils Relative %: 78 %
Platelets: 232 10*3/uL (ref 150–400)
RBC: 2.46 MIL/uL — ABNORMAL LOW (ref 4.22–5.81)
RDW: 16.6 % — ABNORMAL HIGH (ref 11.5–15.5)
WBC: 6.7 10*3/uL (ref 4.0–10.5)
nRBC: 0.6 % — ABNORMAL HIGH (ref 0.0–0.2)

## 2019-08-22 LAB — PROCALCITONIN: Procalcitonin: 0.21 ng/mL

## 2019-08-22 LAB — GLUCOSE, CAPILLARY
Glucose-Capillary: 166 mg/dL — ABNORMAL HIGH (ref 70–99)
Glucose-Capillary: 159 mg/dL — ABNORMAL HIGH (ref 70–99)
Glucose-Capillary: 177 mg/dL — ABNORMAL HIGH (ref 70–99)
Glucose-Capillary: 147 mg/dL — ABNORMAL HIGH (ref 70–99)
Glucose-Capillary: 163 mg/dL — ABNORMAL HIGH (ref 70–99)
Glucose-Capillary: 175 mg/dL — ABNORMAL HIGH (ref 70–99)
Glucose-Capillary: 160 mg/dL — ABNORMAL HIGH (ref 70–99)
Glucose-Capillary: 161 mg/dL — ABNORMAL HIGH (ref 70–99)
Glucose-Capillary: 219 mg/dL — ABNORMAL HIGH (ref 70–99)

## 2019-08-22 LAB — HEPARIN LEVEL (UNFRACTIONATED)
Heparin Unfractionated: <0.1 IU/mL — ABNORMAL LOW (ref 0.30–0.70)
Heparin Unfractionated: <0.1 IU/mL — ABNORMAL LOW (ref 0.30–0.70)

## 2019-08-22 LAB — MAGNESIUM: Magnesium: 2.1 mg/dL (ref 1.7–2.4)

## 2019-08-22 LAB — AMMONIA: Ammonia: 24 umol/L (ref 9–35)

## 2019-08-22 LAB — PHOSPHORUS: Phosphorus: 2.9 mg/dL (ref 2.5–4.6)

## 2019-08-22 MED ORDER — INSULIN ASPART 100 UNIT/ML ~~LOC~~ SOLN
0.0000 [IU] | SUBCUTANEOUS | Status: DC
  Administered 2019-08-22: 4 [IU] via SUBCUTANEOUS
  Administered 2019-08-22: 20:00:00 7 [IU] via SUBCUTANEOUS
  Administered 2019-08-23: 4 [IU] via SUBCUTANEOUS
  Administered 2019-08-23: 17:00:00 3 [IU] via SUBCUTANEOUS
  Administered 2019-08-23: 4 [IU] via SUBCUTANEOUS
  Administered 2019-08-23: 08:00:00 3 [IU] via SUBCUTANEOUS
  Administered 2019-08-23: 4 [IU] via SUBCUTANEOUS
  Administered 2019-08-24: 3 [IU] via SUBCUTANEOUS
  Administered 2019-08-24: 7 [IU] via SUBCUTANEOUS
  Administered 2019-08-24: 4 [IU] via SUBCUTANEOUS
  Administered 2019-08-24 (×2): 7 [IU] via SUBCUTANEOUS
  Administered 2019-08-25: 4 [IU] via SUBCUTANEOUS
  Administered 2019-08-25 (×2): 11 [IU] via SUBCUTANEOUS
  Administered 2019-08-25: 4 [IU] via SUBCUTANEOUS
  Administered 2019-08-25 (×2): 7 [IU] via SUBCUTANEOUS
  Administered 2019-08-26: 3 [IU] via SUBCUTANEOUS
  Administered 2019-08-26: 05:00:00 4 [IU] via SUBCUTANEOUS
  Administered 2019-08-26: 3 [IU] via SUBCUTANEOUS
  Administered 2019-08-26: 4 [IU] via SUBCUTANEOUS
  Administered 2019-08-26 – 2019-08-27 (×2): 7 [IU] via SUBCUTANEOUS
  Administered 2019-08-27: 15 [IU] via SUBCUTANEOUS
  Administered 2019-08-27: 4 [IU] via SUBCUTANEOUS
  Administered 2019-08-27 (×2): 7 [IU] via SUBCUTANEOUS
  Administered 2019-08-28: 6 [IU] via SUBCUTANEOUS
  Administered 2019-08-28 (×5): 4 [IU] via SUBCUTANEOUS
  Administered 2019-08-29 (×2): 3 [IU] via SUBCUTANEOUS
  Administered 2019-08-29 (×2): 4 [IU] via SUBCUTANEOUS
  Administered 2019-08-29: 3 [IU] via SUBCUTANEOUS
  Administered 2019-08-29 – 2019-08-30 (×3): 4 [IU] via SUBCUTANEOUS
  Administered 2019-08-30: 11 [IU] via SUBCUTANEOUS
  Administered 2019-08-30 (×2): 4 [IU] via SUBCUTANEOUS
  Administered 2019-08-30 – 2019-08-31 (×3): 7 [IU] via SUBCUTANEOUS
  Administered 2019-08-31 (×4): 4 [IU] via SUBCUTANEOUS
  Administered 2019-09-01: 3 [IU] via SUBCUTANEOUS
  Administered 2019-09-01: 4 [IU] via SUBCUTANEOUS
  Administered 2019-09-01: 7 [IU] via SUBCUTANEOUS
  Administered 2019-09-01: 4 [IU] via SUBCUTANEOUS
  Administered 2019-09-01 – 2019-09-03 (×9): 3 [IU] via SUBCUTANEOUS
  Administered 2019-09-03: 7 [IU] via SUBCUTANEOUS
  Administered 2019-09-03: 4 [IU] via SUBCUTANEOUS
  Administered 2019-09-04: 3 [IU] via SUBCUTANEOUS
  Administered 2019-09-04 (×3): 4 [IU] via SUBCUTANEOUS
  Administered 2019-09-04 (×2): 7 [IU] via SUBCUTANEOUS
  Administered 2019-09-05 (×2): 3 [IU] via SUBCUTANEOUS
  Administered 2019-09-05 (×2): 4 [IU] via SUBCUTANEOUS
  Administered 2019-09-06 (×3): 3 [IU] via SUBCUTANEOUS
  Administered 2019-09-06 – 2019-09-07 (×4): 4 [IU] via SUBCUTANEOUS
  Administered 2019-09-07 (×2): 7 [IU] via SUBCUTANEOUS
  Administered 2019-09-07: 3 [IU] via SUBCUTANEOUS
  Administered 2019-09-08: 7 [IU] via SUBCUTANEOUS
  Administered 2019-09-08: 4 [IU] via SUBCUTANEOUS
  Administered 2019-09-08: 7 [IU] via SUBCUTANEOUS
  Administered 2019-09-08: 4 [IU] via SUBCUTANEOUS
  Administered 2019-09-09 (×2): 3 [IU] via SUBCUTANEOUS
  Administered 2019-09-10: 4 [IU] via SUBCUTANEOUS
  Administered 2019-09-10: 3 [IU] via SUBCUTANEOUS
  Administered 2019-09-10 – 2019-09-11 (×4): 7 [IU] via SUBCUTANEOUS
  Administered 2019-09-11: 3 [IU] via SUBCUTANEOUS
  Administered 2019-09-11: 4 [IU] via SUBCUTANEOUS
  Administered 2019-09-12 (×2): 7 [IU] via SUBCUTANEOUS
  Administered 2019-09-12: 11 [IU] via SUBCUTANEOUS
  Administered 2019-09-12: 7 [IU] via SUBCUTANEOUS
  Administered 2019-09-12: 3 [IU] via SUBCUTANEOUS
  Administered 2019-09-13: 15 [IU] via SUBCUTANEOUS
  Administered 2019-09-13 (×2): 20 [IU] via SUBCUTANEOUS
  Administered 2019-09-13 (×2): 15 [IU] via SUBCUTANEOUS
  Administered 2019-09-13 – 2019-09-14 (×2): 11 [IU] via SUBCUTANEOUS
  Administered 2019-09-14: 7 [IU] via SUBCUTANEOUS
  Administered 2019-09-14: 20 [IU] via SUBCUTANEOUS
  Administered 2019-09-14: 7 [IU] via SUBCUTANEOUS

## 2019-08-22 MED ORDER — FUROSEMIDE 10 MG/ML IJ SOLN
40.0000 mg | Freq: Once | INTRAMUSCULAR | Status: AC
  Administered 2019-08-22: 15:00:00 40 mg via INTRAVENOUS
  Filled 2019-08-22: qty 4

## 2019-08-22 MED ORDER — DEXTROSE 5 % IV SOLN
2.5000 g | Freq: Three times a day (TID) | INTRAVENOUS | Status: AC
  Administered 2019-08-22 – 2019-08-27 (×15): 2.5 g via INTRAVENOUS
  Filled 2019-08-22 (×18): qty 12

## 2019-08-22 MED ORDER — INSULIN DETEMIR 100 UNIT/ML ~~LOC~~ SOLN
0.3000 [IU]/kg | Freq: Two times a day (BID) | SUBCUTANEOUS | Status: DC
  Administered 2019-08-22 – 2019-08-24 (×6): 37 [IU] via SUBCUTANEOUS
  Filled 2019-08-22 (×8): qty 0.37

## 2019-08-22 MED ORDER — INSULIN ASPART 100 UNIT/ML ~~LOC~~ SOLN
8.0000 [IU] | SUBCUTANEOUS | Status: DC
  Administered 2019-08-22 – 2019-08-23 (×4): 8 [IU] via SUBCUTANEOUS

## 2019-08-22 NOTE — Procedures (Signed)
Patient Name: [name]  MRN: [id number]  Epilepsy Attending: Lora Havens  Referring Physician/Provider: Dr. Amie [place] Date:08/22/2019 Duration: 25.03 minutes  Patient history: 68 year old male, Covid positive with altered mental status.  EEG to evaluate for seizures  Level of alertness: Awake/lethargic  AEDs during EEG study: None  Technical aspects: This EEG study was done with scalp electrodes positioned according to the 10-20 International system of electrode placement. Electrical activity was acquired at a sampling rate of 500Hz  and reviewed with a high frequency filter of 70Hz  and a low frequency filter of 1Hz . EEG data were recorded continuously and digitally stored.   Description: During awake state, no clear posterior dominant rhythm was seen.  EEG showed continuous generalized 2 to 5 Hz theta-delta slowing, at times with triphasic morphology.  Hyperventilation and photic stimulation were not performed.  Abnormality - Continuous slow, generalized  IMPRESSION: This study is suggestive of moderate diffuse encephalopathy, nonspecific to etiology. No seizures or definite epileptiform discharges were seen throughout the recording.  Loyce Klasen Barbra Sarks

## 2019-08-22 NOTE — Progress Notes (Signed)
EEG completed, results pending. 

## 2019-08-22 NOTE — Progress Notes (Signed)
Wife called upset and wants patient to stay on insulin drip does not agree with MD orders assured wife that patient is getting excellent care. Dr. Tamala Julian notified and he or his NP will reach out to wife. Will also contact Sam case manager because wife wants to talk about placement.

## 2019-08-22 NOTE — Progress Notes (Signed)
[place]   Reason for visit: Follow up on fever, CoVID, MDRO PNA  Antibiotics: Aztreonam, day 10 Avycaz, day 10  Interval History: MS remains poor. He remains on ventilator post-trach on Friday. Nursing reports mucus plug with O2 desat this AM requiring suctioning. His FiO2 was increased to 100% 2 hours ago as his O2 sats were slow to recover. He has required no pressors over the weekend. Fever curve, WBC & Cr trends, imaging, cx results, and ABX usage all independently reviewed    Current Facility-Administered Medications:  .  0.9 %  sodium chloride infusion (Manually program via Guardrails IV Fluids), , Intravenous, Once, [place], Shona Needles, MD .  acetaminophen (TYLENOL) 160 MG/5ML solution 650 mg, 650 mg, Per Tube, Q6H PRN, Jerrell Belfast, MD, 650 mg at 08/22/19 1309 .  albuterol (VENTOLIN HFA) 108 (90 Base) MCG/ACT inhaler 1-2 puff, 1-2 puff, Inhalation, Q6H PRN, Jerrell Belfast, MD .  aztreonam (AZACTAM) 2 g in sodium chloride 0.9 % 100 mL IVPB, 2 g, Intravenous, Q8H, Jerrell Belfast, MD, Stopped at 08/22/19 [phone] .  bisacodyl (DULCOLAX) suppository 10 mg, 10 mg, Rectal, Daily PRN, Jerrell Belfast, MD .  ceftazidime-avibactam (AVYCAZ) 2.5 g in dextrose 5 % 50 mL IVPB, 2.5 g, Intravenous, Q8H, Jerrell Belfast, MD, Stopped at 08/22/19 1047 .  chlorhexidine (PERIDEX) 0.12 % solution 15 mL, 15 mL, Mouth/Throat, BID, Jerrell Belfast, MD, 15 mL at 08/22/19 1222 .  Chlorhexidine Gluconate Cloth 2 % PADS 6 each, 6 each, Topical, Daily, Simonne Maffucci B, MD, 6 each at 08/21/19 2200 .  Crystal Light Powder (Potassium Citrate) , 1 packet, Per Tube, Daily, Jerrell Belfast, MD, 1 packet at 08/21/19 1053 .  dextrose 5 %-0.45 % sodium chloride infusion, , Intravenous, Continuous, Ramaswamy, Murali, MD, Last Rate: 10 mL/hr at 08/22/19 1200 .  dextrose 50 % solution 0-50 mL, 0-50 mL, Intravenous, PRN, Jerrell Belfast, MD .  dextrose 50 % solution 0-50 mL, 0-50  mL, Intravenous, PRN, Jerrell Belfast, MD .  diltiazem (CARDIZEM) 10 mg/ml oral suspension 90 mg, 90 mg, Per Tube, Q6H, Jerrell Belfast, MD, 90 mg at 08/22/19 1213 .  docusate (COLACE) 50 MG/5ML liquid 100 mg, 100 mg, Per Tube, BID PRN, Jerrell Belfast, MD, 100 mg at 08/05/19 2150 .  feeding supplement (VITAL 1.5 CAL) liquid 1,000 mL, 1,000 mL, Per Tube, Continuous, McQuaid, Douglas B, MD, Last Rate: 65 mL/hr at 08/22/19 1113, 1,000 mL at 08/22/19 1113 .  folic acid injection 1 mg, 1 mg, Intravenous, Daily, Jerrell Belfast, MD, 1 mg at 08/22/19 1215 .  free water 200 mL, 200 mL, Per Tube, Q6H, Ramaswamy, Murali, MD, 200 mL at 08/22/19 1225 .  furosemide (LASIX) injection 40 mg, 40 mg, Intravenous, Once, Alfonzo Feller, NP .  glycopyrrolate (ROBINUL) tablet 1 mg, 1 mg, Per Tube, TID, Erick Colace, NP, 1 mg at 08/22/19 1213 .  haloperidol lactate (HALDOL) injection 1-4 mg, 1-4 mg, Intravenous, Q3H PRN, Jerrell Belfast, MD, 2 mg at 08/20/19 0317 .  heparin ADULT infusion 100 units/mL (25000 units/275mL sodium chloride 0.45%), 1,850 Units/hr, Intravenous, Continuous, Erenest Blank, RPH, Last Rate: 18.5 mL/hr at 08/22/19 1200, 1,850 Units/hr at 08/22/19 1200 .  insulin aspart (novoLOG) injection 0-20 Units, 0-20 Units, Subcutaneous, Q4H, Joya Gaskins, Rhonda M, NP .  insulin detemir (LEVEMIR) injection 37 Units, 0.3 Units/kg, Subcutaneous, BID, Joya Gaskins, Rhonda M, NP .  insulin regular, human (MYXREDLIN) 100 units/ 100 mL infusion, , Intravenous, Continuous, Jerrell Belfast, MD, Last Rate: 7  mL/hr at 08/22/19 1200 .  ipratropium-albuterol (DUONEB) 0.5-2.5 (3) MG/3ML nebulizer solution 3 mL, 3 mL, Nebulization, Q6H PRN, Jerrell Belfast, MD .  linagliptin (TRADJENTA) tablet 5 mg, 5 mg, Per Tube, Daily, Jerrell Belfast, MD, 5 mg at 08/22/19 1214 .  loperamide HCl (IMODIUM) 1 MG/7.5ML suspension 2 mg, 2 mg, Per Tube, PRN, Jerrell Belfast, MD, 2 mg at 08/17/19 1208 .  MEDLINE mouth rinse, 15 mL,  Mouth Rinse, 10 times per day, Jerrell Belfast, MD, 15 mL at 08/22/19 1230 .  metoprolol tartrate (LOPRESSOR) injection 2.5-5 mg, 2.5-5 mg, Intravenous, Q3H PRN, Jerrell Belfast, MD, 5 mg at 08/11/19 1628 .  metoprolol tartrate (LOPRESSOR) tablet 25 mg, 25 mg, Per Tube, BID, Jerrell Belfast, MD, 25 mg at 08/22/19 1213 .  multivitamin liquid 15 mL, 15 mL, Per Tube, Daily, Jerrell Belfast, MD, 15 mL at 08/22/19 1213 .  nutrition supplement (JUVEN) (JUVEN) powder packet 1 packet, 1 packet, Per Tube, BID, Jerrell Belfast, MD, 1 packet at 08/22/19 1212 .  pantoprazole sodium (PROTONIX) 40 mg/20 mL oral suspension 40 mg, 40 mg, Per Tube, BID, Jerrell Belfast, MD, 40 mg at 08/22/19 1212 .  sodium chloride flush (NS) 0.9 % injection 10-40 mL, 10-40 mL, Intracatheter, Q12H, Jerrell Belfast, MD, 10 mL at 08/22/19 1114 .  sodium chloride flush (NS) 0.9 % injection 10-40 mL, 10-40 mL, Intracatheter, PRN, Jerrell Belfast, MD .  terazosin (HYTRIN) capsule 5 mg, 5 mg, Per Gilman Buttner, MD, 5 mg at 08/21/19 2152 .  [COMPLETED] thiamine 500mg  in normal saline (107ml) IVPB, 500 mg, Intravenous, TID, Stopped at 08/20/19 1143 **FOLLOWED BY** thiamine (B-1) 250 mg in sodium chloride 0.9 % 50 mL IVPB, 250 mg, Intravenous, Q24H, Jerrell Belfast, MD, Stopped at 08/21/19 1728 .  [START ON 08/25/2019] thiamine (VITAMIN B-1) tablet 100 mg, 100 mg, Per Tube, Daily, Jerrell Belfast, MD .  vitamin B-12 (CYANOCOBALAMIN) tablet 1,000 mcg, 1,000 mcg, Per Tube, Daily, Jerrell Belfast, MD, 1,000 mcg at 08/22/19 1214   Physical Exam:   Vitals:   08/22/19 1213 08/22/19 1307  BP: (!) 173/65   Pulse: (!) 112   Resp:    Temp:  100.3 F (37.9 C)  SpO2:     Physical Exam Lines: trach, condom cath, DHT, RT arm midline, PIV Gen: chronically ill, responds only to noxious stimuli (no tracking to verbal stimuli) Head: NCAT, no temporal wasting evident EENT: PERRL, EOMI, MMM, poorly groomed facial hair, +DHT  Neck: +trach, supple, no JVD CV: tachycardic, RR, no murmurs evident Pulm: scant crackles at RT base, no wheeze, occasional retractions on vent, Vent settings: AC mode, 100% FiO2/5 PEEP Abd: soft, NT, diffusely distended, +BS (hypoactive) Extrems: 2+ pitting LE edema, 1+ pulses, s/p RT BKA Skin: no rashes, adequate skin turgor Neuro: responds only to noxious stimuli, does not maintain gaze, no obvious focal neuro deficits appreciated  Review of Systems:  Review of Systems  Unable to perform ROS: Intubated     Lab Results  Component Value Date   WBC 6.7 08/22/2019   HGB 8.1 (L) 08/22/2019   HCT 25.7 (L) 08/22/2019   MCV 104.5 (H) 08/22/2019   PLT 232 08/22/2019    Lab Results  Component Value Date   CREATININE 0.59 (L) 08/22/2019   BUN 26 (H) 08/22/2019   NA 138 08/22/2019   K 4.4 08/22/2019   CL 106 08/22/2019   CO2 25 08/22/2019    Lab Results  Component Value Date   ALT 74 (H) 08/21/2019  AST 47 (H) 08/21/2019   ALKPHOS 84 08/21/2019     Microbiology: No results found for this or any previous visit (from the past 240 hour(s)).  Impression/Plan: Patient is a 68 year old white male diabetic, status post right BKA admitted for Covid pneumonia and subsequent [place] with multidrug-resistant pneumonia/VAP and questionable Covid associated encephalopathy now with recent relapsing fevers.  1.  VAP -the patient's tracheal aspirate on August 10, 2019 showed multiple organisms including Pseudomonas aeruginosa, Serratia marcescens, and multidrug resistant Stenotrophomonas.  Unfortunately synergistic studies for Avycaz and aztreonam were unable to be completed by the outside reference lab.  The patient's ventilator settings continue to wax and wane with a concern for possible mucous plug this morning.  Continue Avycaz and aztreonam for combined treatment for a complete 14-day course.  Please note that further cultures obtained through tracheostomy will be viewed as nonsterile and so  would approach cultures obtained in this way with some skepticism moving forward.  If the patient's respiratory condition does worsen with increasing oxygen requirements, the next step would be to transition likely transition to cefiderocol.  Please wean the patient's FiO2 down as rapidly as possible to avoid further worsening of his ARDS.  2. CoVID-19 -the patient initially tested positive on July 27, 2019.  He received remdesivir, Actemra, and Decadron as part of his COVID-19 treatment given his recent fevers, the patient likely should remain in isolation for the present time but will defer this decision to Dr. Baxter Flattery with infection prevention.  3.  Fever -patient is remained afebrile since the day of his tracheostomy.  He had a negative Doppler ultrasound on August 10, 2019 that showed no DVTs at the time.  If his fever continues given his immobilized state, it would be reasonable to repeat his ultrasounds to ensure that he is not developed a DVT since that time.  Additionally, given his respiratory issues that have waxed and waned with O2 saturations and known thrombotic risks with COVID-19, would also consider obtaining CT angiogram of the patient's chest to rule out pulmonary emboli.  Would also consider getting a TTE to further evaluate as well.  Check blood cultures x2 for any fever greater than 100.5.  4.  Encephalopathy -while this could be related to Covid, this would be a diagnosis of exclusion.  An MRI of the brain on August 19, 2019 showed no evidence of infarction or hemorrhage but a slightly increased disproportionate ventricular prominence since 2017 was noted raising concern for communicating hydrocephalus or increased central volume loss.  Patient is now been off sedation for approximately 72 hours since the time of his tracheostomy with minimal improvement in his neurocognitive status.  Would review all the patient's sedating medications and minimize as best able.  Would also  consider neurology consult as well as potential EEG to rule out subclinical seizures.  33 minutes of critical care time spent

## 2019-08-22 NOTE — Progress Notes (Signed)
Assisted tele visit to patient with son.  Geralene Afshar P, RN  

## 2019-08-22 NOTE — Progress Notes (Signed)
Inpatient Diabetes Program Recommendations  AACE/ADA: New Consensus Statement on Inpatient Glycemic Control  Target Ranges:  Prepandial:   less than 140 mg/dL      Peak postprandial:   less than 180 mg/dL (1-2 hours)      Critically ill patients:  140 - 180 mg/dL   Results for [name], James L III [name] (MRN [id number]) as of 08/22/2019 12:21  Ref. Range 08/22/2019 01:30 08/22/2019 03:44 08/22/2019 05:49 08/22/2019 08:27 08/22/2019 11:05  Glucose-Capillary Latest Ref Range: 70 - 99 mg/dL 166 (H) 159 (H) 177 (H) 147 (H) 163 (H)  Results for [name], James L III [name] (MRN [id number]) as of 08/22/2019 12:21  Ref. Range 08/21/2019 06:43 08/21/2019 08:52 08/21/2019 10:47 08/21/2019 12:52 08/21/2019 14:56 08/21/2019 16:55 08/21/2019 18:05 08/21/2019 19:42 08/21/2019 20:51 08/21/2019 21:47 08/21/2019 23:38  Glucose-Capillary Latest Ref Range: 70 - 99 mg/dL 161 (H) 159 (H) 141 (H) 173 (H) 167 (H) 195 (H) 162 (H) 179 (H) 163 (H) 170 (H) 155 (H)   Review of Glycemic Control  Diabetes history: Outpatient Diabetes medications: Humulin R U500 0-125 units QID (per Endo note on 05/02/19 taking 125 units with breakfast, 115 units with lunch, 110 units with supper, and variable dose at bedtime) Current orders for Inpatient glycemic control: IV insulin, Tradjenta 5 mg daily  Inpatient Diabetes Program Recommendations:   Order Set: Patient has been ordered IV insulin since 08/10/19 and glucose has ranged from 141-195 mg/dl over the past 30 hours. Recommending to use ICU Glycemic Control order set Phase 3 to transition patient from IV to SQ insulin (based on current insulin infusion rate).   Thanks, Barnie Alderman, RN, MSN, CDE Diabetes Coordinator Inpatient Diabetes Program [phone] (Team Pager from 8am to 5pm)
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
## 2019-08-22 NOTE — Progress Notes (Signed)
Called wife and explained thought process for switching insulin drip to subcutaneous.  Will update her daily.

## 2019-08-22 NOTE — Progress Notes (Signed)
ANTICOAGULATION CONSULT NOTE - Follow Up Consult  Pharmacy Consult for Enoxaparin Indication: atrial fibrillation  Allergies  Allergen Reactions  . Bactrim [Sulfamethoxazole-Trimethoprim] Other (See Comments)    Blisters in his mouth  . Budesonide-Formoterol Fumarate Other (See Comments)    Hyperglycemia  Hyperglycemia   . Advair Diskus [Fluticasone-Salmeterol] Other (See Comments)    hyperglycemia  . Hydromorphone Hives, Itching and Nausea And Vomiting  . Pulmicort [Budesonide] Other (See Comments)    Hyperglycemia   . Morphine Itching  . Soap Itching and Other (See Comments)    Other Reaction: ivory soap=itching     Patient Measurements: Height: 6\' 1"  (185.4 cm) Weight: 270 lb 1 oz (122.5 kg) IBW/kg (Calculated) : 79.9 Heparin Dosing Weight: 103 kg  Vital Signs: Temp: 99.7 F (37.6 C) (12/07 0524) Temp Source: Axillary (12/07 0524) BP: 162/67 (12/07 0500) Pulse Rate: 112 (12/07 0500)  Labs: Recent Labs    08/20/19 0007  08/20/19 0445 08/20/19 1359 08/21/19 0448 08/22/19 0350  HGB  --    < > 6.7* 8.3* 8.0* 8.1*  HCT  --    < > 21.6* 25.4* 24.4* 25.7*  PLT  --    < > 229 250 243 232  LABPROT  --   --  14.5  --   --   --   INR  --   --  1.1  --   --   --   HEPARINUNFRC  --   --   --   --   --  <0.10*  CREATININE 0.66  --   --   --  0.60* 0.59*   < > = values in this interval not displayed.    Estimated Creatinine Clearance: 124.5 mL/min (A) (by C-G formula based on SCr of 0.59 mg/dL (L)).  Assessment: 68 y/o Mwith severe covid. Developed afib - was on enoxaparin at [facility]  Now at [facility] s/p trach  12/7 AM update:  Heparin level undetectable No issues per RN Trach site looks good H/H low but stable  Goal of Therapy:  Heparin level 0.3 - 0.5 units/ml Monitor platelets by anticoagulation protocol: Yes   Plan:  -Inc heparin to 1850 units/hr -Re-check heparin level at [place], PharmD, [place] Pharmacist Phone: [phone]

## 2019-08-22 NOTE — Progress Notes (Signed)
ANTICOAGULATION CONSULT NOTE - Follow Up Consult  Pharmacy Consult for heparin  Indication: atrial fibrillation  Allergies  Allergen Reactions  . Bactrim [Sulfamethoxazole-Trimethoprim] Other (See Comments)    Blisters in his mouth  . Budesonide-Formoterol Fumarate Other (See Comments)    Hyperglycemia  Hyperglycemia   . Advair Diskus [Fluticasone-Salmeterol] Other (See Comments)    hyperglycemia  . Hydromorphone Hives, Itching and Nausea And Vomiting  . Pulmicort [Budesonide] Other (See Comments)    Hyperglycemia   . Morphine Itching  . Soap Itching and Other (See Comments)    Other Reaction: ivory soap=itching     Patient Measurements: Height: 6\' 1"  (185.4 cm) Weight: 270 lb 1 oz (122.5 kg) IBW/kg (Calculated) : 79.9 Heparin Dosing Weight: 103 kg  Vital Signs: Temp: 100.2 F (37.9 C) (12/07 1701) Temp Source: Axillary (12/07 1701) BP: 166/62 (12/07 1323) Pulse Rate: 76 (12/07 1323)  Labs: Recent Labs    08/20/19 0007  08/20/19 0445 08/20/19 1359 08/21/19 0448 08/22/19 0350 08/22/19 1423  HGB  --    < > 6.7* 8.3* 8.0* 8.1*  --   HCT  --    < > 21.6* 25.4* 24.4* 25.7*  --   PLT  --    < > 229 250 243 232  --   LABPROT  --   --  14.5  --   --   --   --   INR  --   --  1.1  --   --   --   --   HEPARINUNFRC  --   --   --   --   --  <0.10* <0.10*  CREATININE 0.66  --   --   --  0.60* 0.59*  --    < > = values in this interval not displayed.    Estimated Creatinine Clearance: 124.5 mL/min (A) (by C-G formula based on SCr of 0.59 mg/dL (L)).  Assessment: 68 y/o Mwith severe covid. Developed afib - was on enoxaparin at [facility] s/p trach and on a heparin drip  IV line out earlier today - new PIV running fine per RN Heparin drip 1850 uts/hr HL undetectable  No bleeding, CBC low stable  Goal of Therapy:  Heparin level 0.3 - 0.5 units/ml Monitor platelets by anticoagulation protocol: Yes   Plan:  -Inc heparin to 2000 units/hr -Re-check heparin level  with AML -monitor s/s bleeding   Bonnita Nasuti Pharm.D. CPP, BCPS Clinical Pharmacist [phone] 08/22/2019 6:05 PM

## 2019-08-23 DIAGNOSIS — U071 COVID-19: Secondary | ICD-10-CM | POA: Diagnosis not present

## 2019-08-23 DIAGNOSIS — J1289 Other viral pneumonia: Secondary | ICD-10-CM | POA: Diagnosis not present

## 2019-08-23 LAB — BASIC METABOLIC PANEL
Anion gap: 10 (ref 5–15)
BUN: 25 mg/dL — ABNORMAL HIGH (ref 8–23)
CO2: 25 mmol/L (ref 22–32)
Calcium: 9.3 mg/dL (ref 8.9–10.3)
Chloride: 107 mmol/L (ref 98–111)
Creatinine, Ser: 0.47 mg/dL — ABNORMAL LOW (ref 0.61–1.24)
GFR calc Af Amer: >60 mL/min (ref >60)
GFR calc non Af Amer: >60 mL/min (ref >60)
Glucose, Bld: 157 mg/dL — ABNORMAL HIGH (ref 70–99)
Potassium: 4.2 mmol/L (ref 3.5–5.1)
Sodium: 142 mmol/L (ref 135–145)

## 2019-08-23 LAB — CBC
HCT: 25.7 % — ABNORMAL LOW (ref 39.0–52.0)
Hemoglobin: 8.1 g/dL — ABNORMAL LOW (ref 13.0–17.0)
MCH: 33.6 pg (ref 26.0–34.0)
MCHC: 31.5 g/dL (ref 30.0–36.0)
MCV: 106.6 fL — ABNORMAL HIGH (ref 80.0–100.0)
Platelets: 259 10*3/uL (ref 150–400)
RBC: 2.41 MIL/uL — ABNORMAL LOW (ref 4.22–5.81)
RDW: 17.1 % — ABNORMAL HIGH (ref 11.5–15.5)
WBC: 6.6 10*3/uL (ref 4.0–10.5)
nRBC: 0.3 % — ABNORMAL HIGH (ref 0.0–0.2)

## 2019-08-23 LAB — PHOSPHORUS: Phosphorus: 2.6 mg/dL (ref 2.5–4.6)

## 2019-08-23 LAB — GLUCOSE, CAPILLARY
Glucose-Capillary: 112 mg/dL — ABNORMAL HIGH (ref 70–99)
Glucose-Capillary: 138 mg/dL — ABNORMAL HIGH (ref 70–99)
Glucose-Capillary: 150 mg/dL — ABNORMAL HIGH (ref 70–99)
Glucose-Capillary: 151 mg/dL — ABNORMAL HIGH (ref 70–99)
Glucose-Capillary: 183 mg/dL — ABNORMAL HIGH (ref 70–99)
Glucose-Capillary: 199 mg/dL — ABNORMAL HIGH (ref 70–99)

## 2019-08-23 LAB — HEPARIN LEVEL (UNFRACTIONATED)
Heparin Unfractionated: <0.1 IU/mL — ABNORMAL LOW (ref 0.30–0.70)
Heparin Unfractionated: 0.23 IU/mL — ABNORMAL LOW (ref 0.30–0.70)

## 2019-08-23 LAB — MAGNESIUM: Magnesium: 2.2 mg/dL (ref 1.7–2.4)

## 2019-08-23 MED ORDER — DILTIAZEM 12 MG/ML ORAL SUSPENSION
120.0000 mg | Freq: Four times a day (QID) | ORAL | Status: DC
  Administered 2019-08-23 – 2019-09-08 (×65): 120 mg
  Filled 2019-08-23 (×70): qty 12

## 2019-08-23 MED ORDER — LABETALOL HCL 5 MG/ML IV SOLN
10.0000 mg | INTRAVENOUS | Status: DC | PRN
  Administered 2019-08-23 – 2019-09-10 (×2): 10 mg via INTRAVENOUS
  Filled 2019-08-23 (×2): qty 4

## 2019-08-23 MED ORDER — INSULIN ASPART 100 UNIT/ML ~~LOC~~ SOLN
9.0000 [IU] | SUBCUTANEOUS | Status: DC
  Administered 2019-08-23 – 2019-09-01 (×54): 9 [IU] via SUBCUTANEOUS

## 2019-08-23 MED ORDER — PRO-STAT SUGAR FREE PO LIQD
60.0000 mL | Freq: Two times a day (BID) | ORAL | Status: DC
  Administered 2019-08-23 – 2019-09-14 (×45): 60 mL
  Filled 2019-08-23 (×44): qty 60

## 2019-08-23 MED ORDER — FUROSEMIDE 10 MG/ML IJ SOLN
40.0000 mg | Freq: Four times a day (QID) | INTRAMUSCULAR | Status: AC
  Administered 2019-08-23 (×2): 40 mg via INTRAVENOUS
  Filled 2019-08-23 (×2): qty 4

## 2019-08-23 MED ORDER — SODIUM CHLORIDE 0.9 % IV SOLN: INTRAVENOUS | Status: DC | PRN

## 2019-08-23 MED ORDER — CHLORHEXIDINE GLUCONATE 0.12 % MT SOLN
OROMUCOSAL | Status: AC
  Filled 2019-08-23: qty 15

## 2019-08-23 NOTE — Progress Notes (Signed)
Nutrition Follow-up  RD working remotely.  DOCUMENTATION CODES:   Obesity unspecified  INTERVENTION:   Continue TF via Cortrak tube: - Vital 1.5 @ 65 ml/hr (1560 ml per day) - Pro-stat 60 ml BID - Free water per MD, currently 200 ml QID  Tube feeding regimen and current free water provides 2740 kcal, 165 grams protein, and 1992 ml free water daily.  - Continue Juven BID via tube, each packet provides 80 calories, 8 grams of carbohydrate, 2.5grams of protein (collagen), 7 grams of L-arginine and 7 grams of L-glutamine; supplement contains CaHMB, Vitamins C, E, B12 and Zinc to promote wound healing  NUTRITION DIAGNOSIS:   Increased nutrient needs related to acute illness (COVID-19 positive) as evidenced by estimated needs.  Ongoing  GOAL:   Provide needs based on ASPEN/SCCM guidelines  Met via TF  MONITOR:   Vent status, Labs, Weight trends, TF tolerance, Skin  REASON FOR ASSESSMENT:   Ventilator    ASSESSMENT:   68 y.o. male with medical history of DM, HTN, PVD s/p right BKA, BPH, chronic pain syndrome, COPD, and hyperlipidemia. He presented to the ED on 11/19 due to SOB, cough, wheezing, and confusion. He was diagnosed with COVID-19 on 11/15. He was 71% on RA when EMS arrived and he was placed on NRB. In the ED he had a temperature of 102.1. CXR showed multifocal PNA and pattern consistent with COVID-19.  11/25 - Cortrak tube placed, tip in stomach 12/04 - s/p trach by ENT  Cortrak remains in place with TF infusing. Spoke with RN via phone call who reports pt is tolerating TF without issue.  Current TF regimen: Vital 1.5 @ 65 ml/hr, free water 200 ml q 6 hours  RD will add Pro-stat to aid in meeting protein needs.  Weight up 20 lbs over the last week. Suspect weight gain related to fluid status. Per RN edema assessment, pt with mild pitting edema to BUE and BLE.  Patient is currently intubated on ventilator support MV: 14.2 L/min Temp (24hrs), Avg:100 F  (37.8 C), Min:98.5 F (36.9 C), Max:101.4 F (38.6 C) BP (cuff): 156/67 MAP (cuff): 92  Medications reviewed and include: potassium citrate, folic acid, SSI, Novolog 9 units q 4 hours, Levemir 37 units BID, Tradjenta, liquid MVI, Juven BID, Protonix, vitamin B-12, IV abx, IV thiamine, heparin  Labs reviewed: hemoglobin 8.1 CBG's: 160-219 x 24 hours  UOP: 3175 ml x 24 hours  Diet Order:   Diet Order            Diet NPO time specified  Diet effective now              EDUCATION NEEDS:   No education needs have been identified at this time  Skin:  Skin Assessment: Skin Integrity Issues: Skin Integrity Issues: DTI: sacrum Stage II: left ear Incisions: neck Other: skin tear to right arm  Last BM:  08/22/19 rectal tube  Height:   Ht Readings from Last 1 Encounters:  08/05/19 '6\' 1"'  (1.854 m)    Weight:   Wt Readings from Last 1 Encounters:  08/23/19 119.2 kg    Ideal Body Weight:  78.2 kg (adjusted for BKA)  BMI:  Body mass index is 34.67 kg/m.  Estimated Nutritional Needs:   Kcal:  4166-0630  Protein:  >/= 167 grams  Fluid:  >/= 2.6 L    Gaynell Face, MS, RD, LDN Inpatient Clinical Dietitian Pager: [phone] Weekend/After Hours: [phone]

## 2019-08-23 NOTE — Progress Notes (Signed)
ANTICOAGULATION CONSULT NOTE - Follow Up Consult  Pharmacy Consult for heparin  Indication: atrial fibrillation  Allergies  Allergen Reactions  . Bactrim [Sulfamethoxazole-Trimethoprim] Other (See Comments)    Blisters in his mouth  . Budesonide-Formoterol Fumarate Other (See Comments)    Hyperglycemia  Hyperglycemia   . Advair Diskus [Fluticasone-Salmeterol] Other (See Comments)    hyperglycemia  . Hydromorphone Hives, Itching and Nausea And Vomiting  . Pulmicort [Budesonide] Other (See Comments)    Hyperglycemia   . Morphine Itching  . Soap Itching and Other (See Comments)    Other Reaction: ivory soap=itching     Patient Measurements: Height: 6\' 1"  (185.4 cm) Weight: 262 lb 12.6 oz (119.2 kg) IBW/kg (Calculated) : 79.9 Heparin Dosing Weight: 103 kg  Vital Signs: Temp: 100 F (37.8 C) (12/08 1600) Temp Source: Oral (12/08 1600) BP: 171/81 (12/08 1700) Pulse Rate: 102 (12/08 1700)  Labs: Recent Labs    08/21/19 0448  08/22/19 0350 08/22/19 1423 08/23/19 0607 08/23/19 1644  HGB 8.0*  --  8.1*  --  8.1*  --   HCT 24.4*  --  25.7*  --  25.7*  --   PLT 243  --  232  --  259  --   HEPARINUNFRC  --    < > <0.10* <0.10* <0.10* 0.23*  CREATININE 0.60*  --  0.59*  --  0.47*  --    < > = values in this interval not displayed.    Estimated Creatinine Clearance: 122.8 mL/min (A) (by C-G formula based on SCr of 0.47 mg/dL (L)).  Assessment: 68 y/o Mwith severe covid. Developed afib - was on enoxaparin at [facility] s/p trach and on a heparin drip   IV line out earlier today - new PIV running fine per RN  Heparin level this evening is SUBtherapeutic though trending up (HL 0.23). No bleeding or issues noted per RN.   Goal of Therapy:  Heparin level 0.3 - 0.5 units/ml Monitor platelets by anticoagulation protocol: Yes   Plan:  - Increase Heparin to 2400 units/hr (24 ml/hr) - Will continue to monitor for any signs/symptoms of bleeding and will follow up with  heparin level in 6 hours   Thank you for allowing pharmacy to be a part of this patient's care.  Alycia Rossetti, PharmD, BCPS Clinical Pharmacist Clinical phone for 08/23/2019: [phone] 08/23/2019 5:53 PM   **Pharmacist phone directory can now be found on [email or web address] ([email or web address]).  Listed under [place].

## 2019-08-23 NOTE — Progress Notes (Signed)
Assisted tele visit to patient with family member.  [name] McEachran, RN  

## 2019-08-23 NOTE — Progress Notes (Signed)
ANTICOAGULATION CONSULT NOTE - Follow Up Consult  Pharmacy Consult for heparin  Indication: atrial fibrillation  Allergies  Allergen Reactions  . Bactrim [Sulfamethoxazole-Trimethoprim] Other (See Comments)    Blisters in his mouth  . Budesonide-Formoterol Fumarate Other (See Comments)    Hyperglycemia  Hyperglycemia   . Advair Diskus [Fluticasone-Salmeterol] Other (See Comments)    hyperglycemia  . Hydromorphone Hives, Itching and Nausea And Vomiting  . Pulmicort [Budesonide] Other (See Comments)    Hyperglycemia   . Morphine Itching  . Soap Itching and Other (See Comments)    Other Reaction: ivory soap=itching     Patient Measurements: Height: 6\' 1"  (185.4 cm) Weight: 262 lb 12.6 oz (119.2 kg) IBW/kg (Calculated) : 79.9 Heparin Dosing Weight: 103 kg  Vital Signs: Temp: 98.8 F (37.1 C) (12/08 0400) Temp Source: Oral (12/08 0400) BP: 156/67 (12/08 1000) Pulse Rate: 114 (12/08 1000)  Labs: Recent Labs    08/21/19 0448 08/22/19 0350 08/22/19 1423 08/23/19 0607  HGB 8.0* 8.1*  --  8.1*  HCT 24.4* 25.7*  --  25.7*  PLT 243 232  --  259  HEPARINUNFRC  --  <0.10* <0.10* <0.10*  CREATININE 0.60* 0.59*  --  0.47*    Estimated Creatinine Clearance: 122.8 mL/min (A) (by C-G formula based on SCr of 0.47 mg/dL (L)).  Assessment: 68 y/o Mwith severe covid. Developed afib - was on enoxaparin at [facility] s/p trach and on a heparin drip  IV line out earlier today - new PIV running fine per RN Heparin drip 2000 uts/hr HL undetectable  No bleeding, CBC low stable  Goal of Therapy:  Heparin level 0.3 - 0.5 units/ml Monitor platelets by anticoagulation protocol: Yes   Plan:  -Inc heparin to 2200 units/hr -Re-check heparin level in 6-8 hr -monitor s/s bleeding   Alanda Slim, PharmD, [facility] Clinical Pharmacist Please see AMION for all Pharmacists' Contact Phone Numbers 08/23/2019, 10:24 AM

## 2019-08-23 NOTE — Care Management (Signed)
Pt transferred from [place].  Pt is currently on vent via trach.  Pt has a LOS of 18 days however still very critical.  CM initiated LTACH discussion with attending per discharge planning.  Attending in agreement that pt will benefit from [facility] once he stabilizes.  Attending group with consult CM once pt is appropriate for referral.  TOC will continue to follow

## 2019-08-23 NOTE — Progress Notes (Signed)
[facility] for Infectious Disease   Reason for visit: Follow up on fever, CoVID, VAP  Antibiotics: Avycaz, day 11 Aztreonam, day 11  Interval History: O2 requirements on vent are significantly improved from yesterday. Nursing reports frequent suctioning but no mucus plugs since yesterday morning. MS remains poor as he only responds to noxious stimuli. No arythmias. Continues to tolerate TFs at goal. Fever curve, WBC & Cr trends, imaging, cx results, and ABX usage all independently reviewed    Current Facility-Administered Medications:    0.9 %  sodium chloride infusion (Manually program via Guardrails IV Fluids), , Intravenous, Once, [place], Shona Needles, MD   acetaminophen (TYLENOL) 160 MG/5ML solution 650 mg, 650 mg, Per Tube, Q6H PRN, Jerrell Belfast, MD, 650 mg at 08/22/19 2011   albuterol (VENTOLIN HFA) 108 (90 Base) MCG/ACT inhaler 1-2 puff, 1-2 puff, Inhalation, Q6H PRN, Jerrell Belfast, MD   aztreonam (AZACTAM) 2 g in sodium chloride 0.9 % 100 mL IVPB, 2 g, Intravenous, Q8H, Jerrell Belfast, MD, Last Rate: 200 mL/hr at 08/23/19 1149, 2 g at 08/23/19 1149   bisacodyl (DULCOLAX) suppository 10 mg, 10 mg, Rectal, Daily PRN, Jerrell Belfast, MD   ceftazidime-avibactam (AVYCAZ) 2.5 g in dextrose 5 % 50 mL IVPB, 2.5 g, Intravenous, Q8H, Candee Furbish, MD, Stopped at 08/23/19 0317   chlorhexidine (PERIDEX) 0.12 % solution 15 mL, 15 mL, Mouth/Throat, BID, Jerrell Belfast, MD, 15 mL at 08/23/19 [id number]   Chlorhexidine Gluconate Cloth 2 % PADS 6 each, 6 each, Topical, Daily, Simonne Maffucci B, MD, 6 each at 08/22/19 2100   Crystal Light Powder (Potassium Citrate) , 1 packet, Per Tube, Daily, Jerrell Belfast, MD, 1 packet at 08/23/19 1111   dextrose 5 %-0.45 % sodium chloride infusion, , Intravenous, Continuous, Brand Males, MD, Stopped at 08/23/19 0007   dextrose 50 % solution 0-50 mL, 0-50 mL, Intravenous, PRN, Jerrell Belfast, MD   dextrose 50 % solution 0-50  mL, 0-50 mL, Intravenous, PRN, Jerrell Belfast, MD   diltiazem (CARDIZEM) 10 mg/ml oral suspension 90 mg, 90 mg, Per Tube, Q6H, Jerrell Belfast, MD, 90 mg at 08/23/19 1131   docusate (COLACE) 50 MG/5ML liquid 100 mg, 100 mg, Per Tube, BID PRN, Jerrell Belfast, MD, 100 mg at 08/05/19 2150   feeding supplement (PRO-STAT SUGAR FREE 64) liquid 60 mL, 60 mL, Per Tube, BID, Candee Furbish, MD, 60 mL at 08/23/19 1114   feeding supplement (VITAL 1.5 CAL) liquid 1,000 mL, 1,000 mL, Per Tube, Continuous, McQuaid, Douglas B, MD, Last Rate: 65 mL/hr at 08/23/19 0334, 1,000 mL at [id number]   folic acid injection 1 mg, 1 mg, Intravenous, Daily, Jerrell Belfast, MD, 1 mg at 08/22/19 1215   free water 200 mL, 200 mL, Per Tube, Q6H, Ramaswamy, Murali, MD, 200 mL at 08/23/19 1131   furosemide (LASIX) injection 40 mg, 40 mg, Intravenous, Q6H, Candee Furbish, MD   glycopyrrolate (ROBINUL) tablet 1 mg, 1 mg, Per Tube, TID, Erick Colace, NP, 1 mg at 08/23/19 1111   haloperidol lactate (HALDOL) injection 1-4 mg, 1-4 mg, Intravenous, Q3H PRN, Jerrell Belfast, MD, 2 mg at 08/20/19 0317   heparin ADULT infusion 100 units/mL (25000 units/261mL sodium chloride 0.45%), 2,200 Units/hr, Intravenous, Continuous, Candee Furbish, MD, Last Rate: 22 mL/hr at 08/23/19 1040, 2,200 Units/hr at 08/23/19 1040   insulin aspart (novoLOG) injection 0-20 Units, 0-20 Units, Subcutaneous, Q4H, Alfonzo Feller, NP, 3 Units at 08/23/19 0805   insulin aspart (novoLOG) injection 9 Units, 9  Units, Subcutaneous, Q4H, Minor, Grace Bushy, NP, 9 Units at 08/23/19 1123   insulin detemir (LEVEMIR) injection 37 Units, 0.3 Units/kg, Subcutaneous, BID, Minor, Grace Bushy, NP, 37 Units at 08/23/19 1112   ipratropium-albuterol (DUONEB) 0.5-2.5 (3) MG/3ML nebulizer solution 3 mL, 3 mL, Nebulization, Q6H PRN, Jerrell Belfast, MD   linagliptin (TRADJENTA) tablet 5 mg, 5 mg, Per Tube, Daily, Jerrell Belfast, MD, 5 mg at 08/23/19 1114    loperamide HCl (IMODIUM) 1 MG/7.5ML suspension 2 mg, 2 mg, Per Tube, PRN, Jerrell Belfast, MD, 2 mg at 08/17/19 1208   MEDLINE mouth rinse, 15 mL, Mouth Rinse, 10 times per day, Jerrell Belfast, MD, 15 mL at 08/23/19 1115   metoprolol tartrate (LOPRESSOR) injection 2.5-5 mg, 2.5-5 mg, Intravenous, Q3H PRN, Jerrell Belfast, MD, 5 mg at 08/11/19 1628   metoprolol tartrate (LOPRESSOR) tablet 25 mg, 25 mg, Per Tube, BID, Jerrell Belfast, MD, 25 mg at 08/23/19 1112   multivitamin liquid 15 mL, 15 mL, Per Tube, Daily, Jerrell Belfast, MD, 15 mL at 08/23/19 1113   nutrition supplement (JUVEN) (JUVEN) powder packet 1 packet, 1 packet, Per Tube, BID, Jerrell Belfast, MD, 1 packet at 08/23/19 1113   pantoprazole sodium (PROTONIX) 40 mg/20 mL oral suspension 40 mg, 40 mg, Per Tube, BID, Jerrell Belfast, MD, 40 mg at 08/23/19 1112   sodium chloride flush (NS) 0.9 % injection 10-40 mL, 10-40 mL, Intracatheter, Q12H, Jerrell Belfast, MD, 10 mL at 08/22/19 2103   sodium chloride flush (NS) 0.9 % injection 10-40 mL, 10-40 mL, Intracatheter, PRN, Jerrell Belfast, MD   terazosin (HYTRIN) capsule 5 mg, 5 mg, Per Tube, Beatris Ship, MD, 5 mg at 08/22/19 2200   [COMPLETED] thiamine 500mg  in normal saline (21ml) IVPB, 500 mg, Intravenous, TID, Stopped at 08/20/19 1143 **FOLLOWED BY** thiamine (B-1) 250 mg in sodium chloride 0.9 % 50 mL IVPB, 250 mg, Intravenous, Q24H, Jerrell Belfast, MD, Stopped at 08/22/19 2213   [START ON 08/25/2019] thiamine (VITAMIN B-1) tablet 100 mg, 100 mg, Per Tube, Daily, Jerrell Belfast, MD   vitamin B-12 (CYANOCOBALAMIN) tablet 1,000 mcg, 1,000 mcg, Per Tube, Daily, Jerrell Belfast, MD, 1,000 mcg at 08/23/19 1115   Physical Exam:   Vitals:   08/23/19 1124 08/23/19 1200  BP: (!) 176/67 (!) 164/65  Pulse: (!) 111 89  Resp: (!) 34 (!) 24  Temp:    SpO2: 95% 95%   Physical Exam Lines: trach, DHT, RT arm midline, PIV, condom cath Gen: chronically ill, mild  anasarca, only responsive to noxious stimuli and decreased response to sternal rub today Head: NCAT, no temporal wasting evident EENT: PERRL, EOMI, MMM, poorly groomed facial hair, +DHT,adequate dentition Neck: +trach, supple, no JVD CV: tachycardic, RR, no murmurs evident Pulm: scant bibasilar crackles, no wheeze or retractions, Vent settings: AC mode 60% FiO2/5 PEEP Abd: soft, NT, diffusely distended, +BS Extrems: 2+ pitting LE edema, 1+ pulses, s/p RT BKA Skin: no rashes, adequate skin turgor Neuro: minimal response to even noxious stimuli, spontaneous head and arm movements at times, follows no verbal commands  Review of Systems:  Review of Systems  Unable to perform ROS: Intubated     Lab Results  Component Value Date   WBC 6.6 08/23/2019   HGB 8.1 (L) 08/23/2019   HCT 25.7 (L) 08/23/2019   MCV 106.6 (H) 08/23/2019   PLT 259 08/23/2019    Lab Results  Component Value Date   CREATININE 0.47 (L) 08/23/2019   BUN 25 (H) 08/23/2019   NA 142 08/23/2019  K 4.2 08/23/2019   CL 107 08/23/2019   CO2 25 08/23/2019    Lab Results  Component Value Date   ALT 74 (H) 08/21/2019   AST 47 (H) 08/21/2019   ALKPHOS 84 08/21/2019     Microbiology: No results found for this or any previous visit (from the past 240 hour(s)).  Impression/Plan: The patient is a 68 year old white male diabetic, status post right BKA admitted for Covid pneumonia and subsequent ARDS with multidrug-resistant pneumonia/VAP, recent relapsing fevers, and questionable COVID-19 associated encephalopathy.  1. VAP -ventilator settings are improved today with a decrease in FiO2 and no further mucous plugs.  Will continue Avycaz and aztreonam to complete a 2-week course for Pseudomonas aeruginosa, Serratia marcescens, and multidrug-resistant stenotrophomonas.  Should the patient develop worsening respiratory status, he will likely require transition to cefiderocol.  Now the patient has a tracheostomy further tracheal  aspirates will be needed approached with skepticism as his lung would be no longer viewed as a sterile site, making it difficult to distinguish between colonization versus infection.  2.  COVID-19-patient initially tested positive on July 27, 2019.  He received remdesivir, Actemra, and Decadron drawn as part of his COVID-19 treatment.  Given his recent fevers, the patient likely will need to remain in isolation but will defer this decision to Dr. Baxter Flattery will with infection prevention  3.  Fever-the patient has remained afebrile since his tracheostomy last Friday.  He had negative Doppler ultrasounds on November showing no DVT.  Given his active COVID-19 and chronically immobilized state, must remain vigilant for the emergence of DVTs and have a low threshold for repeating Doppler ultrasounds.  The patient is on treatment dose heparin prophylactically to lower his risk for developing pulmonary emboli.  Most recent blood cultures remain unrevealing but would repeat the patient's blood cultures x2 for any fever greater than 100.5.  4. Encephalopathy -while this could be related to COVID-19, this would be a diagnosis of exclusion.  Patient had an MRI on December 4 I on August 19, 2019 that showed no evidence of infarction or hemorrhage but did show an increased central volume loss versus concern for communicating hydrocephalus..  Patient has now been off sedation for the past 4 days with minimal improvement in any neurocognitive status.  Would review all other sedating medications and limit use as best able.  He also may benefit from a neurological evaluation as an EEG could be of value to rule out subclinical seizures.  If his neurocognitive decline is felt to be due to COVID-19 by a neurologist, recovery would be expected to be extremely slowed if even possible.  34 minutes of critical care time spent
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
## 2019-08-24 ENCOUNTER — Inpatient Hospital Stay (HOSPITAL_COMMUNITY): Payer: Medicare Other

## 2019-08-24 DIAGNOSIS — J1289 Other viral pneumonia: Secondary | ICD-10-CM | POA: Diagnosis not present

## 2019-08-24 DIAGNOSIS — U071 COVID-19: Secondary | ICD-10-CM | POA: Diagnosis not present

## 2019-08-24 LAB — CBC WITH DIFFERENTIAL/PLATELET
Abs Immature Granulocytes: 0.03 10*3/uL (ref 0.00–0.07)
Basophils Absolute: 0 10*3/uL (ref 0.0–0.1)
Basophils Relative: 1 %
Eosinophils Absolute: 0.1 10*3/uL (ref 0.0–0.5)
Eosinophils Relative: 1 %
HCT: 25.7 % — ABNORMAL LOW (ref 39.0–52.0)
Hemoglobin: 8.3 g/dL — ABNORMAL LOW (ref 13.0–17.0)
Immature Granulocytes: 1 %
Lymphocytes Relative: 14 %
Lymphs Abs: 0.9 10*3/uL (ref 0.7–4.0)
MCH: 34.2 pg — ABNORMAL HIGH (ref 26.0–34.0)
MCHC: 32.3 g/dL (ref 30.0–36.0)
MCV: 105.8 fL — ABNORMAL HIGH (ref 80.0–100.0)
Monocytes Absolute: 0.4 10*3/uL (ref 0.1–1.0)
Monocytes Relative: 6 %
Neutro Abs: 5.1 10*3/uL (ref 1.7–7.7)
Neutrophils Relative %: 77 %
Platelets: 247 10*3/uL (ref 150–400)
RBC: 2.43 MIL/uL — ABNORMAL LOW (ref 4.22–5.81)
RDW: 17.4 % — ABNORMAL HIGH (ref 11.5–15.5)
WBC: 6.6 10*3/uL (ref 4.0–10.5)
nRBC: 0 % (ref 0.0–0.2)

## 2019-08-24 LAB — HEPATIC FUNCTION PANEL
ALT: 50 U/L — ABNORMAL HIGH (ref 0–44)
AST: 21 U/L (ref 15–41)
Albumin: 1.8 g/dL — ABNORMAL LOW (ref 3.5–5.0)
Alkaline Phosphatase: 93 U/L (ref 38–126)
Bilirubin, Direct: 0.1 mg/dL (ref 0.0–0.2)
Indirect Bilirubin: 0.4 mg/dL (ref 0.3–0.9)
Total Bilirubin: 0.5 mg/dL (ref 0.3–1.2)
Total Protein: 5.4 g/dL — ABNORMAL LOW (ref 6.5–8.1)

## 2019-08-24 LAB — BASIC METABOLIC PANEL
Anion gap: 7 (ref 5–15)
BUN: 27 mg/dL — ABNORMAL HIGH (ref 8–23)
CO2: 29 mmol/L (ref 22–32)
Calcium: 9.4 mg/dL (ref 8.9–10.3)
Chloride: 104 mmol/L (ref 98–111)
Creatinine, Ser: 0.56 mg/dL — ABNORMAL LOW (ref 0.61–1.24)
GFR calc Af Amer: >60 mL/min (ref >60)
GFR calc non Af Amer: >60 mL/min (ref >60)
Glucose, Bld: 174 mg/dL — ABNORMAL HIGH (ref 70–99)
Potassium: 4.3 mmol/L (ref 3.5–5.1)
Sodium: 140 mmol/L (ref 135–145)

## 2019-08-24 LAB — PHOSPHORUS: Phosphorus: 3 mg/dL (ref 2.5–4.6)

## 2019-08-24 LAB — GLUCOSE, CAPILLARY
Glucose-Capillary: 116 mg/dL — ABNORMAL HIGH (ref 70–99)
Glucose-Capillary: 129 mg/dL — ABNORMAL HIGH (ref 70–99)
Glucose-Capillary: 155 mg/dL — ABNORMAL HIGH (ref 70–99)
Glucose-Capillary: 215 mg/dL — ABNORMAL HIGH (ref 70–99)
Glucose-Capillary: 216 mg/dL — ABNORMAL HIGH (ref 70–99)
Glucose-Capillary: 226 mg/dL — ABNORMAL HIGH (ref 70–99)
Glucose-Capillary: 241 mg/dL — ABNORMAL HIGH (ref 70–99)

## 2019-08-24 LAB — MAGNESIUM: Magnesium: 2.2 mg/dL (ref 1.7–2.4)

## 2019-08-24 LAB — HEPARIN LEVEL (UNFRACTIONATED): Heparin Unfractionated: 0.13 IU/mL — ABNORMAL LOW (ref 0.30–0.70)

## 2019-08-24 MED ORDER — ENOXAPARIN SODIUM 120 MG/0.8ML ~~LOC~~ SOLN
1.0000 mg/kg | Freq: Two times a day (BID) | SUBCUTANEOUS | Status: DC
  Administered 2019-08-24 – 2019-08-26 (×6): 120 mg via SUBCUTANEOUS
  Filled 2019-08-24 (×8): qty 0.8

## 2019-08-24 MED ORDER — CHLORHEXIDINE GLUCONATE CLOTH 2 % EX PADS
6.0000 | MEDICATED_PAD | Freq: Every day | CUTANEOUS | Status: DC
  Administered 2019-08-24 – 2019-09-19 (×23): 6 via TOPICAL

## 2019-08-24 MED ORDER — FENTANYL CITRATE (PF) 100 MCG/2ML IJ SOLN: 12.5000 ug | INTRAMUSCULAR | Status: DC | PRN

## 2019-08-24 MED ORDER — MELATONIN 3 MG PO TABS
3.0000 mg | ORAL_TABLET | Freq: Every evening | ORAL | Status: DC | PRN
  Filled 2019-08-24: qty 1

## 2019-08-24 MED ORDER — FUROSEMIDE 10 MG/ML IJ SOLN
40.0000 mg | Freq: Four times a day (QID) | INTRAMUSCULAR | Status: AC
  Administered 2019-08-24 (×2): 40 mg via INTRAVENOUS
  Filled 2019-08-24 (×2): qty 4

## 2019-08-24 MED ORDER — NON FORMULARY: 3.0000 mg | Freq: Every evening | Status: DC | PRN

## 2019-08-24 MED ORDER — SODIUM CHLORIDE 0.9 % IV SOLN
INTRAVENOUS | Status: DC | PRN
  Administered 2019-08-24: 12:00:00 500 mL via INTRAVENOUS
  Administered 2019-08-27 – 2019-09-12 (×2): 250 mL via INTRAVENOUS

## 2019-08-24 NOTE — Progress Notes (Signed)
ANTICOAGULATION CONSULT NOTE - Follow Up Consult  Pharmacy Consult for Heparin  Indication: atrial fibrillation  Allergies  Allergen Reactions  . Bactrim [Sulfamethoxazole-Trimethoprim] Other (See Comments)    Blisters in his mouth  . Budesonide-Formoterol Fumarate Other (See Comments)    Hyperglycemia  Hyperglycemia   . Advair Diskus [Fluticasone-Salmeterol] Other (See Comments)    hyperglycemia  . Hydromorphone Hives, Itching and Nausea And Vomiting  . Pulmicort [Budesonide] Other (See Comments)    Hyperglycemia   . Morphine Itching  . Soap Itching and Other (See Comments)    Other Reaction: ivory soap=itching     Patient Measurements: Height: 6\' 1"  (185.4 cm) Weight: 262 lb 12.6 oz (119.2 kg) IBW/kg (Calculated) : 79.9 Heparin Dosing Weight: 103 kg  Vital Signs: Temp: 100.3 F (37.9 C) (12/09 0000) Temp Source: Oral (12/09 0000) BP: 166/61 (12/09 0224) Pulse Rate: 94 (12/09 0224)  Labs: Recent Labs    08/22/19 0350  08/23/19 0607 08/23/19 1644 08/24/19 0108  HGB 8.1*  --  8.1*  --  8.3*  HCT 25.7*  --  25.7*  --  25.7*  PLT 232  --  259  --  247  HEPARINUNFRC <0.10*   < > <0.10* 0.23* 0.13*  CREATININE 0.59*  --  0.47*  --  0.56*   < > = values in this interval not displayed.    Estimated Creatinine Clearance: 122.8 mL/min (A) (by C-G formula based on SCr of 0.56 mg/dL (L)).  Assessment: 68 y/o Mwith severe covid. Developed afib - was on enoxaparin at [facility] s/p trach and on a heparin drip   Heparin level this evening is SUBtherapeutic though trending up (HL 0.23). No bleeding or issues noted per RN.   12/9 AM update: heparin level remains sub-therapeutic, infusing ok per RN, H/H continues to be low but stable  Goal of Therapy:  Heparin level 0.3 - 0.5 units/ml Monitor platelets by anticoagulation protocol: Yes   Plan:  - Increase Heparin to 2650 units/hr - Will continue to monitor for any signs/symptoms of bleeding and will follow up with  heparin level in 6-8 hours   Narda Bonds, PharmD, [place] Pharmacist Phone: [phone]
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
## 2019-08-24 NOTE — Progress Notes (Signed)
RT note: patient placed on CPAP/PSV of 12/5 at 0735.  Currently tolerating well.  Will continue to monitor.

## 2019-08-24 NOTE — Progress Notes (Signed)
Pt's wife updated in detail  by Richardson Landry Minor NP. Called wife after update and inquired if any further questions and concerns. Emotional support provided.

## 2019-08-24 NOTE — Progress Notes (Signed)
RT note: patient placed back on full support ventilation due to having a decrease in sats.  Tolerating well at this time.  Will continue to monitor.

## 2019-08-24 NOTE — Progress Notes (Signed)
[facility] Progress Note Patient Name: [name] DOB: Sep 12, 1951 MRN: [id number]   Date of Service  08/24/2019  HPI/Events of Note  Pt is on the ventilator and having difficulty "getting comfortable" or sleeping  eICU Interventions  PRN Fentanyl order entered, Melatonin 3 mg via Cortrak Q HS PRN insomnia ordered.        [name] 08/24/2019, 9:55 PM

## 2019-08-25 ENCOUNTER — Inpatient Hospital Stay (HOSPITAL_COMMUNITY): Payer: Medicare Other

## 2019-08-25 DIAGNOSIS — Z1624 Resistance to multiple antibiotics: Secondary | ICD-10-CM | POA: Diagnosis not present

## 2019-08-25 DIAGNOSIS — L8995 Pressure ulcer of unspecified site, unstageable: Secondary | ICD-10-CM

## 2019-08-25 DIAGNOSIS — Z930 Tracheostomy status: Secondary | ICD-10-CM

## 2019-08-25 DIAGNOSIS — U071 COVID-19: Secondary | ICD-10-CM | POA: Diagnosis not present

## 2019-08-25 DIAGNOSIS — J1289 Other viral pneumonia: Secondary | ICD-10-CM | POA: Diagnosis not present

## 2019-08-25 DIAGNOSIS — J80 Acute respiratory distress syndrome: Secondary | ICD-10-CM | POA: Diagnosis not present

## 2019-08-25 LAB — BASIC METABOLIC PANEL
Anion gap: 8 (ref 5–15)
BUN: 33 mg/dL — ABNORMAL HIGH (ref 8–23)
CO2: 29 mmol/L (ref 22–32)
Calcium: 9.7 mg/dL (ref 8.9–10.3)
Chloride: 107 mmol/L (ref 98–111)
Creatinine, Ser: 0.59 mg/dL — ABNORMAL LOW (ref 0.61–1.24)
GFR calc Af Amer: >60 mL/min (ref >60)
GFR calc non Af Amer: >60 mL/min (ref >60)
Glucose, Bld: 237 mg/dL — ABNORMAL HIGH (ref 70–99)
Potassium: 4.3 mmol/L (ref 3.5–5.1)
Sodium: 144 mmol/L (ref 135–145)

## 2019-08-25 LAB — CBC WITH DIFFERENTIAL/PLATELET
Abs Immature Granulocytes: 0.03 10*3/uL (ref 0.00–0.07)
Basophils Absolute: 0 10*3/uL (ref 0.0–0.1)
Basophils Relative: 0 %
Eosinophils Absolute: 0.1 10*3/uL (ref 0.0–0.5)
Eosinophils Relative: 2 %
HCT: 25.3 % — ABNORMAL LOW (ref 39.0–52.0)
Hemoglobin: 7.9 g/dL — ABNORMAL LOW (ref 13.0–17.0)
Immature Granulocytes: 1 %
Lymphocytes Relative: 16 %
Lymphs Abs: 1 10*3/uL (ref 0.7–4.0)
MCH: 33.1 pg (ref 26.0–34.0)
MCHC: 31.2 g/dL (ref 30.0–36.0)
MCV: 105.9 fL — ABNORMAL HIGH (ref 80.0–100.0)
Monocytes Absolute: 0.4 10*3/uL (ref 0.1–1.0)
Monocytes Relative: 7 %
Neutro Abs: 4.7 10*3/uL (ref 1.7–7.7)
Neutrophils Relative %: 74 %
Platelets: 287 10*3/uL (ref 150–400)
RBC: 2.39 MIL/uL — ABNORMAL LOW (ref 4.22–5.81)
RDW: 17.7 % — ABNORMAL HIGH (ref 11.5–15.5)
WBC: 6.2 10*3/uL (ref 4.0–10.5)
nRBC: 0 % (ref 0.0–0.2)

## 2019-08-25 LAB — GLUCOSE, CAPILLARY
Glucose-Capillary: 211 mg/dL — ABNORMAL HIGH (ref 70–99)
Glucose-Capillary: 176 mg/dL — ABNORMAL HIGH (ref 70–99)
Glucose-Capillary: 293 mg/dL — ABNORMAL HIGH (ref 70–99)
Glucose-Capillary: 285 mg/dL — ABNORMAL HIGH (ref 70–99)
Glucose-Capillary: 192 mg/dL — ABNORMAL HIGH (ref 70–99)

## 2019-08-25 LAB — MAGNESIUM: Magnesium: 2.3 mg/dL (ref 1.7–2.4)

## 2019-08-25 LAB — PHOSPHORUS: Phosphorus: 2.8 mg/dL (ref 2.5–4.6)

## 2019-08-25 MED ORDER — COLLAGENASE 250 UNIT/GM EX OINT
TOPICAL_OINTMENT | Freq: Every day | CUTANEOUS | Status: DC
  Administered 2019-08-25 – 2019-08-29 (×5): via TOPICAL
  Filled 2019-08-25: qty 30

## 2019-08-25 MED ORDER — INSULIN DETEMIR 100 UNIT/ML ~~LOC~~ SOLN
38.0000 [IU] | Freq: Two times a day (BID) | SUBCUTANEOUS | Status: DC
  Administered 2019-08-25 – 2019-08-27 (×5): 38 [IU] via SUBCUTANEOUS
  Filled 2019-08-25 (×6): qty 0.38

## 2019-08-25 MED ORDER — QUETIAPINE FUMARATE 50 MG PO TABS
50.0000 mg | ORAL_TABLET | Freq: Two times a day (BID) | ORAL | Status: DC
  Administered 2019-08-25 – 2019-08-27 (×5): 50 mg
  Filled 2019-08-25 (×5): qty 1

## 2019-08-25 NOTE — Progress Notes (Signed)
[facility] for Infectious Disease    Date of Admission:  08/12/2019      ID: [name] is a 68 y.o. male with encephalopathy, MDRO pneumonia, ARDS related to COVID-19, slow vent wean s/p trach. Deconditioned, unstageable pressure wound Principal Problem:   Pneumonia due to COVID-19 virus Active Problems:   ANEMIA, IRON DEFICIENCY   CAD (coronary artery disease)   COPD, severe (HCC)   Gastroesophageal reflux disease with hiatal hernia   Diabetes mellitus type 2 in obese ([place])   COVID-19   ARDS (adult respiratory distress syndrome) (HCC)   Pressure injury of skin   Acute respiratory failure with hypoxemia (HCC)   Acute encephalopathy    Subjective: aferile  Medications:  . sodium chloride   Intravenous Once  . chlorhexidine  15 mL Mouth/Throat BID  . Chlorhexidine Gluconate Cloth  6 each Topical Daily  . collagenase   Topical Daily  . Crystal Light Powder (Potassium Citrate)   1 packet Per Tube Daily  . diltiazem  120 mg Per Tube Q6H  . enoxaparin (LOVENOX) injection  1 mg/kg Subcutaneous Q12H  . feeding supplement (PRO-STAT SUGAR FREE 64)  60 mL Per Tube BID  . folic acid  1 mg Intravenous Daily  . free water  200 mL Per Tube Q6H  . glycopyrrolate  1 mg Per Tube TID  . insulin aspart  0-20 Units Subcutaneous Q4H  . insulin aspart  9 Units Subcutaneous Q4H  . insulin detemir  38 Units Subcutaneous BID  . linagliptin  5 mg Per Tube Daily  . mouth rinse  15 mL Mouth Rinse 10 times per day  . metoprolol tartrate  25 mg Per Tube BID  . multivitamin  15 mL Per Tube Daily  . nutrition supplement (JUVEN)  1 packet Per Tube BID  . pantoprazole sodium  40 mg Per Tube BID  . QUEtiapine  50 mg Per Tube BID  . sodium chloride flush  10-40 mL Intracatheter Q12H  . terazosin  5 mg Per Tube QHS  . thiamine  100 mg Per Tube Daily  . vitamin B-12  1,000 mcg Per Tube Daily    Objective: Vital signs in last 24 hours: Temp:  [99.2 F (37.3 C)-101.3 F (38.5 C)]  99.3 F (37.4 C) (12/10 1532) Pulse Rate:  [84-123] 100 (12/10 1600) Resp:  [13-35] 21 (12/10 1600) BP: (121-178)/(46-76) 121/55 (12/10 1600) SpO2:  [87 %-100 %] 97 % (12/10 1600) FiO2 (%):  [50 %-60 %] 50 % (12/10 1526) Weight:  [117.5 kg] 117.5 kg (12/10 0400)  Lab Results Recent Labs    08/24/19 0108 08/25/19 0247  WBC 6.6 6.2  HGB 8.3* 7.9*  HCT 25.7* 25.3*  NA 140 144  K 4.3 4.3  CL 104 107  CO2 29 29  BUN 27* 33*  CREATININE 0.56* 0.59*   Liver Panel Recent Labs    08/24/19 0108  PROT 5.4*  ALBUMIN 1.8*  AST 21  ALT 50*  ALKPHOS 93  BILITOT 0.5  BILIDIR 0.1  IBILI 0.4   Sedimentation Rate No results for input(s): ESRSEDRATE in the last 72 hours. C-Reactive Protein No results for input(s): CRP in the last 72 hours.  Microbiology:  Studies/Results: AM DG Chest Port 1 View  Result Date: 08/25/2019 CLINICAL DATA:  Respiratory failure. EXAM: PORTABLE CHEST 1 VIEW COMPARISON:  08/24/2019 FINDINGS: Tracheostomy tube unchanged. Enteric tube courses into the region of the stomach and off the film as tip is not visualized.  Lungs are adequately inflated demonstrate persistent stable consolidation over the right lung and left mid to lower lung likely multifocal pneumonia. Suggestion of small to moderate bilateral pleural effusions unchanged. Cardiomediastinal silhouette and remainder of the exam is unchanged. IMPRESSION: 1. Stable bilateral multifocal airspace process likely pneumonia. Bilateral pleural effusions unchanged 2. Tubes and lines as described. Electronically Signed   By: Marin Olp M.D.   On: 08/25/2019 07:42   AM DG Chest Port 1 View  Result Date: 08/24/2019 CLINICAL DATA:  Respiratory failure. EXAM: PORTABLE CHEST 1 VIEW COMPARISON:  Radiograph 08/22/2019 FINDINGS: Tracheostomy tube tip at the thoracic inlet. Weighted enteric tube tip below the diaphragm not included in the field of view. Slight worsening in bilateral heterogeneous airspace disease, most  prominent worsening in the left perihilar lung. Unchanged heart size and mediastinal contours. Bilateral pleural effusions are not significantly changed. No pneumothorax. Multiple remote right rib fractures. IMPRESSION: 1. Worsening bilateral airspace disease, most prominent in the left perihilar lung, likely pneumonia. 2. Unchanged bilateral pleural effusions. 3. Stable support apparatus. Electronically Signed   By: Keith Rake M.D.   On: 08/24/2019 05:53     Assessment/Plan: MDRO pneumonia = currently on day 12 of 14 with aztreonam and avycaz for PsA, serratia, and stenotrophomonas  Pressure wound = recommend to follow woc recs, consider santyl to area of eschar so that can have clearer wound base. Currently unstageable. If new onset fever would do mri or ct to see if signs of osteomyelitis.for now, continue with local wound care.  Patchy pulmonary infiltrate = likely multifactorial, to include post acute covid inflammatory process. Could consider work up for heart failure  Agree that patient can be discontinued from airborne contact  [facility] for Infectious Diseases Cell: [phone] Pager: [phone]  08/25/2019, 5:07 PM

## 2019-08-25 NOTE — Progress Notes (Signed)
Upon arrival to room for 1200 assessment, patient noted to be on CPAP/PSV of 12/5.  RT unaware of when vent change was made.  Will continue to monitor.

## 2019-08-25 NOTE — Progress Notes (Addendum)
Inpatient Diabetes Program Recommendations  AACE/ADA: New Consensus Statement on Inpatient Glycemic Control   Target Ranges:  Prepandial:   less than 140 mg/dL      Peak postprandial:   less than 180 mg/dL (1-2 hours)      Critically ill patients:  140 - 180 mg/dL   Results for [name], [name] [name] (MRN [id number]) as of 08/25/2019 11:20  Ref. Range 08/24/2019 08:12 08/24/2019 11:48 08/24/2019 15:35 08/24/2019 19:54 08/24/2019 23:40 08/25/2019 03:52 08/25/2019 07:47  Glucose-Capillary Latest Ref Range: 70 - 99 mg/dL 116 (H) 226 (H) 241 (H) 215 (H) 216 (H) 211 (H) 176 (H)   Review of Glycemic Control  Diabetes history: DM2 Outpatient Diabetes medications: Humulin R U500 0-125 units QID (per Endo note on 05/02/19 taking 125 units with breakfast, 115 units with lunch, 110 units with supper, and variable dose at bedtime) Current orders for Inpatient glycemic control: Levemir 38 units BID, Novolog 9 units Q4H for tube feeding coverage, Novolog 0-20 units Q4H, Tradjenta 5 mg daily  Inpatient Diabetes Program Recommendations:   Insulin - Basal: Noted Lantus increased from 37 units BID to 38 units BID today.  Insulin -Tube Feeding Coverage: Please consider increasing tube feeding coverage to Novolog 12 units Q4H.  Thanks, Barnie Alderman, RN, MSN, CDE Diabetes Coordinator Inpatient Diabetes Program [phone] (Team Pager from 8am to 5pm)

## 2019-08-25 NOTE — Progress Notes (Signed)
Assisted tele visit to patient with wife.  Betrice Wanat P, RN  

## 2019-08-25 NOTE — Progress Notes (Signed)
RT note: attempted SBT on patient this AM however patient's respiratory rate increased to high 30s.  Placed patient back on full support ventilation and is currently tolerating well at this time.  Will continue to monitor.
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
## 2019-08-25 NOTE — Consult Note (Addendum)
[place] Nurse wound follow up Patient receiving care in [facility] 2M16.  I spoke with the primary RN, Domenick Bookbinder, via telephone.  Images of the wound were placed this morning. Wound type: Unstageable PI Measurement: To be provided by the bedside RN in the flowsheet section Wound bed: yellow and brown slough Drainage (amount, consistency, odor) none reported Periwound: intact  Dressing procedure/placement/frequency:  Apply Santyl to the coccyx wound in a nickel thick layer. Cover with a saline moistened gauze, then dry gauze or ABD pad.  Change daily. Monitor the wound area(s) for worsening of condition such as: Signs/symptoms of infection,  Increase in size,  Development of or worsening of odor, Development of pain, or increased pain at the affected locations.  Notify the medical team if any of these develop.  Thank you for the consult.  Discussed plan of care with the bedside nurse.  [place] nurse will not follow at this time.  Please re-consult the [place] team if needed.  Val Riles, RN, MSN, CWOCN, CNS-BC, pager [phone]

## 2019-08-26 ENCOUNTER — Inpatient Hospital Stay (HOSPITAL_COMMUNITY): Payer: Medicare Other

## 2019-08-26 LAB — BASIC METABOLIC PANEL
Anion gap: 6 (ref 5–15)
BUN: 36 mg/dL — ABNORMAL HIGH (ref 8–23)
CO2: 29 mmol/L (ref 22–32)
Calcium: 9.8 mg/dL (ref 8.9–10.3)
Chloride: 111 mmol/L (ref 98–111)
Creatinine, Ser: 0.54 mg/dL — ABNORMAL LOW (ref 0.61–1.24)
GFR calc Af Amer: >60 mL/min (ref >60)
GFR calc non Af Amer: >60 mL/min (ref >60)
Glucose, Bld: 224 mg/dL — ABNORMAL HIGH (ref 70–99)
Potassium: 4.1 mmol/L (ref 3.5–5.1)
Sodium: 146 mmol/L — ABNORMAL HIGH (ref 135–145)

## 2019-08-26 LAB — CBC
HCT: 25 % — ABNORMAL LOW (ref 39.0–52.0)
Hemoglobin: 7.8 g/dL — ABNORMAL LOW (ref 13.0–17.0)
MCH: 33.6 pg (ref 26.0–34.0)
MCHC: 31.2 g/dL (ref 30.0–36.0)
MCV: 107.8 fL — ABNORMAL HIGH (ref 80.0–100.0)
Platelets: 282 10*3/uL (ref 150–400)
RBC: 2.32 MIL/uL — ABNORMAL LOW (ref 4.22–5.81)
RDW: 17.8 % — ABNORMAL HIGH (ref 11.5–15.5)
WBC: 5.4 10*3/uL (ref 4.0–10.5)
nRBC: 0 % (ref 0.0–0.2)

## 2019-08-26 LAB — MAGNESIUM: Magnesium: 2.4 mg/dL (ref 1.7–2.4)

## 2019-08-26 LAB — GLUCOSE, CAPILLARY
Glucose-Capillary: 116 mg/dL — ABNORMAL HIGH (ref 70–99)
Glucose-Capillary: 124 mg/dL — ABNORMAL HIGH (ref 70–99)
Glucose-Capillary: 143 mg/dL — ABNORMAL HIGH (ref 70–99)
Glucose-Capillary: 155 mg/dL — ABNORMAL HIGH (ref 70–99)
Glucose-Capillary: 176 mg/dL — ABNORMAL HIGH (ref 70–99)
Glucose-Capillary: 184 mg/dL — ABNORMAL HIGH (ref 70–99)
Glucose-Capillary: 210 mg/dL — ABNORMAL HIGH (ref 70–99)

## 2019-08-26 LAB — PHOSPHORUS: Phosphorus: 2.7 mg/dL (ref 2.5–4.6)

## 2019-08-26 NOTE — TOC Progression Note (Addendum)
Transition of Care [facility]) - Progression Note    Patient Details  Name: [name] MRN: [id number] Date of Birth: 1951-04-04  Transition of Care [facility]) CM/SW Contact  Maryclare Labrador, RN Phone Number: 08/26/2019, 11:17 AM  Clinical Narrative:    CM received verbal order to start [facility] referral process.  CM provided [facility] to both [facility] and [facility] - both agencies reviewing to determine if a bed offer can be made.  PCCM has already discussed recommendation of LTACH with wife.  [facility] is unable to make bed offer.  [facility] still reviewing for acceptance.  CM informed wife that [facility] has declined to make bed.  Wife to discuss option of [facility] with family however informed CM that she is sure they will not agree for [facility].            Expected Discharge Plan and Services                                                 Social Determinants of Health (SDOH) Interventions    Readmission Risk Interventions No flowsheet data found.
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
## 2019-08-26 NOTE — Progress Notes (Signed)
RT called at 1000 by MD to inform RT that PSV had been decreased from 12 to 8 and that patient was tolerating settings well at that time and stated to RT to try patient on trach collar if patient still looked comfortable in an hour or so.  Upon arrival for 1200 assessment, noted that patient's sats were maintaining at 88% with a good waveform.  Suctioned copious amounts of thick, yellow secretions from patient and sats still did not improve.  Had to place patient back on full support ventilator settings with sats currently at 91%.  Will attempt to wean again if patient's sats stabilize and patient able to tolerate.

## 2019-08-26 NOTE — Progress Notes (Signed)
[facility] for Infectious Disease    Date of Admission:  08/03/2019             ID: [name] is a 68 y.o. male with   Principal Problem:   Pneumonia due to COVID-19 virus Active Problems:   ANEMIA, IRON DEFICIENCY   CAD (coronary artery disease)   COPD, severe (HCC)   Gastroesophageal reflux disease with hiatal hernia   Diabetes mellitus type 2 in obese ([place])   COVID-19   ARDS (adult respiratory distress syndrome) (HCC)   Pressure injury of skin   Acute respiratory failure with hypoxemia (HCC)   Acute encephalopathy    Subjective: Afebrile, less secretions  Medications:  . sodium chloride   Intravenous Once  . chlorhexidine  15 mL Mouth/Throat BID  . Chlorhexidine Gluconate Cloth  6 each Topical Daily  . collagenase   Topical Daily  . Crystal Light Powder (Potassium Citrate)   1 packet Per Tube Daily  . diltiazem  120 mg Per Tube Q6H  . enoxaparin (LOVENOX) injection  1 mg/kg Subcutaneous Q12H  . feeding supplement (PRO-STAT SUGAR FREE 64)  60 mL Per Tube BID  . folic acid  1 mg Intravenous Daily  . free water  200 mL Per Tube Q6H  . glycopyrrolate  1 mg Per Tube TID  . insulin aspart  0-20 Units Subcutaneous Q4H  . insulin aspart  9 Units Subcutaneous Q4H  . insulin detemir  38 Units Subcutaneous BID  . linagliptin  5 mg Per Tube Daily  . mouth rinse  15 mL Mouth Rinse 10 times per day  . metoprolol tartrate  25 mg Per Tube BID  . multivitamin  15 mL Per Tube Daily  . nutrition supplement (JUVEN)  1 packet Per Tube BID  . pantoprazole sodium  40 mg Per Tube BID  . QUEtiapine  50 mg Per Tube BID  . sodium chloride flush  10-40 mL Intracatheter Q12H  . terazosin  5 mg Per Tube QHS  . thiamine  100 mg Per Tube Daily  . vitamin B-12  1,000 mcg Per Tube Daily    Objective: Vital signs in last 24 hours: Temp:  [97.8 F (36.6 C)-100.9 F (38.3 C)] 100.6 F (38.1 C) (12/11 1527) Pulse Rate:  [81-112] 110 (12/11 1527) Resp:  [19-34] 28 (12/11  1527) BP: (121-180)/(55-93) 173/65 (12/11 1527) SpO2:  [91 %-100 %] 96 % (12/11 1527) FiO2 (%):  [40 %-50 %] 40 % (12/11 1527) Weight:  [115.5 kg] 115.5 kg (12/11 0400)  Physical Exam  Constitutional: He is oriented solomnent. He appears chronically ill and well-nourished. No distress.  HENT: trach in place Mouth/Throat: Oropharynx is clear and moist. No oropharyngeal exudate.  Cardiovascular: Normal rate, regular rhythm and normal heart sounds. Exam reveals no gallop and no friction rub.  No murmur heard.  Pulmonary/Chest: Effort normal and breath sounds normal. No respiratory distress. He has no wheezes.  Abdominal: Soft. Bowel sounds are decreased. He exhibits no distension. There is no tenderness.  Ext: right bka Neurological: does not follow commands Skin: Skin is warm and dry. No rash noted. No erythema. Has unstageable sacral wound    Lab Results Recent Labs    08/25/19 0247 08/26/19 0302  WBC 6.2 5.4  HGB 7.9* 7.8*  HCT 25.3* 25.0*  NA 144 146*  K 4.3 4.1  CL 107 111  CO2 29 29  BUN 33* 36*  CREATININE 0.59* 0.54*   Liver Panel Recent Labs  08/24/19 0108  PROT 5.4*  ALBUMIN 1.8*  AST 21  ALT 50*  ALKPHOS 93  BILITOT 0.5  BILIDIR 0.1  IBILI 0.4    Microbiology: reviewed Studies/Results: AM DG Chest Port 1 View  Result Date: 08/26/2019 CLINICAL DATA:  68 year old male with respiratory failure. Positive for COVID-19 last month. EXAM: PORTABLE CHEST 1 VIEW COMPARISON:  08/25/2019 portable chest and earlier. FINDINGS: Semiupright AP portable view at 0454 hours. Stable tracheostomy and visible feeding tube. Stable lung volumes and mediastinal contours. Coarse and confluent bilateral perihilar and lower lobe opacity remains not significantly changed from 08/24/2019. No superimposed pneumothorax. No areas of worsening ventilation. Stable visualized osseous structures. IMPRESSION: 1.  Stable lines and tubes. 2. Widespread bilateral pneumonia stable since  08/24/2019. Electronically Signed   By: Genevie Ann M.D.   On: 08/26/2019 07:57   AM DG Chest Port 1 View  Result Date: 08/25/2019 CLINICAL DATA:  Respiratory failure. EXAM: PORTABLE CHEST 1 VIEW COMPARISON:  08/24/2019 FINDINGS: Tracheostomy tube unchanged. Enteric tube courses into the region of the stomach and off the film as tip is not visualized. Lungs are adequately inflated demonstrate persistent stable consolidation over the right lung and left mid to lower lung likely multifocal pneumonia. Suggestion of small to moderate bilateral pleural effusions unchanged. Cardiomediastinal silhouette and remainder of the exam is unchanged. IMPRESSION: 1. Stable bilateral multifocal airspace process likely pneumonia. Bilateral pleural effusions unchanged 2. Tubes and lines as described. Electronically Signed   By: Marin Olp M.D.   On: 08/25/2019 07:42     Assessment/Plan: mdro vent/hospital related pneumonia = finish out the course of avycaz and aztreonam to finish out 14 d course. We will place end date for the weekend  covid -19, critical illness= no longer in infectious window, however still has significant sequelae with encephalopathy, deconditioning and respiratory distress s/p trach  Sacral wound = recommend santyl to debride eschar and local wound care  Will sign off.  [facility] for Infectious Diseases Cell: [phone] Pager: [phone]  08/26/2019, 5:32 PM

## 2019-08-26 NOTE — Progress Notes (Signed)
RT note: patient placed on CPAP/PSV 12/5 at 0725.  Tolerating well at this time.  Will continue to monitor.

## 2019-08-27 LAB — CBC
HCT: 26.8 % — ABNORMAL LOW (ref 39.0–52.0)
Hemoglobin: 8.4 g/dL — ABNORMAL LOW (ref 13.0–17.0)
MCH: 34.4 pg — ABNORMAL HIGH (ref 26.0–34.0)
MCHC: 31.3 g/dL (ref 30.0–36.0)
MCV: 109.8 fL — ABNORMAL HIGH (ref 80.0–100.0)
Platelets: 305 10*3/uL (ref 150–400)
RBC: 2.44 MIL/uL — ABNORMAL LOW (ref 4.22–5.81)
RDW: 18.6 % — ABNORMAL HIGH (ref 11.5–15.5)
WBC: 5.4 10*3/uL (ref 4.0–10.5)
nRBC: 0.6 % — ABNORMAL HIGH (ref 0.0–0.2)

## 2019-08-27 LAB — BASIC METABOLIC PANEL
Anion gap: 11 (ref 5–15)
BUN: 35 mg/dL — ABNORMAL HIGH (ref 8–23)
CO2: 26 mmol/L (ref 22–32)
Calcium: 9.6 mg/dL (ref 8.9–10.3)
Chloride: 112 mmol/L — ABNORMAL HIGH (ref 98–111)
Creatinine, Ser: 0.62 mg/dL (ref 0.61–1.24)
GFR calc Af Amer: >60 mL/min (ref >60)
GFR calc non Af Amer: >60 mL/min (ref >60)
Glucose, Bld: 255 mg/dL — ABNORMAL HIGH (ref 70–99)
Potassium: 4.3 mmol/L (ref 3.5–5.1)
Sodium: 149 mmol/L — ABNORMAL HIGH (ref 135–145)

## 2019-08-27 LAB — MAGNESIUM: Magnesium: 2.3 mg/dL (ref 1.7–2.4)

## 2019-08-27 LAB — GLUCOSE, CAPILLARY
Glucose-Capillary: 225 mg/dL — ABNORMAL HIGH (ref 70–99)
Glucose-Capillary: 230 mg/dL — ABNORMAL HIGH (ref 70–99)
Glucose-Capillary: 337 mg/dL — ABNORMAL HIGH (ref 70–99)
Glucose-Capillary: 242 mg/dL — ABNORMAL HIGH (ref 70–99)
Glucose-Capillary: 118 mg/dL — ABNORMAL HIGH (ref 70–99)

## 2019-08-27 LAB — PHOSPHORUS: Phosphorus: 2.7 mg/dL (ref 2.5–4.6)

## 2019-08-27 MED ORDER — INSULIN DETEMIR 100 UNIT/ML ~~LOC~~ SOLN
10.0000 [IU] | Freq: Once | SUBCUTANEOUS | Status: AC
  Administered 2019-08-27: 10 [IU] via SUBCUTANEOUS
  Filled 2019-08-27: qty 0.1

## 2019-08-27 MED ORDER — INSULIN DETEMIR 100 UNIT/ML ~~LOC~~ SOLN
45.0000 [IU] | Freq: Two times a day (BID) | SUBCUTANEOUS | Status: DC
  Administered 2019-08-27 – 2019-08-31 (×8): 45 [IU] via SUBCUTANEOUS
  Filled 2019-08-27 (×9): qty 0.45

## 2019-08-27 MED ORDER — APIXABAN 5 MG PO TABS
5.0000 mg | ORAL_TABLET | Freq: Two times a day (BID) | ORAL | Status: DC
  Administered 2019-08-27 – 2019-09-08 (×26): 5 mg
  Filled 2019-08-27 (×27): qty 1

## 2019-08-27 MED ORDER — METOPROLOL TARTRATE 12.5 MG HALF TABLET
50.0000 mg | ORAL_TABLET | Freq: Two times a day (BID) | ORAL | Status: DC
  Administered 2019-08-27: 50 mg
  Filled 2019-08-27: qty 4

## 2019-08-27 MED ORDER — INSULIN DETEMIR 100 UNIT/ML ~~LOC~~ SOLN: 50.0000 [IU] | Freq: Two times a day (BID) | SUBCUTANEOUS | Status: DC

## 2019-08-27 MED ORDER — MELATONIN 3 MG PO TABS
3.0000 mg | ORAL_TABLET | Freq: Every day | ORAL | Status: DC
  Administered 2019-08-27 – 2019-09-05 (×9): 3 mg via ORAL
  Filled 2019-08-27 (×10): qty 1

## 2019-08-27 MED ORDER — HYDRALAZINE HCL 50 MG PO TABS
25.0000 mg | ORAL_TABLET | Freq: Three times a day (TID) | ORAL | Status: DC
  Administered 2019-08-27 – 2019-08-28 (×3): 25 mg via ORAL
  Filled 2019-08-27 (×3): qty 1

## 2019-08-27 NOTE — Progress Notes (Signed)
RT note- First attempt on ATC, tolerating well at this time, continue to monitor

## 2019-08-27 NOTE — Progress Notes (Signed)
Updated wife by phone  Erskine Raekwon MD
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
## 2019-08-27 NOTE — Progress Notes (Signed)
eLink Physician-Brief Progress Note Patient Name: LARAMIE ASTURIAS III DOB: 24-Nov-1950 MRN: [id number]   Date of Service  08/27/2019  HPI/Events of Note  Fever > 102 F despite tylenol and ice packs.  eICU Interventions  Ordered cooling blanket. Patient is already on appropriate antimicrobials and fevers are not new.     Intervention Category Intermediate Interventions: Other:  Charlott Rakes 08/27/2019, 9:47 PM

## 2019-08-28 ENCOUNTER — Inpatient Hospital Stay (HOSPITAL_COMMUNITY): Payer: Medicare Other

## 2019-08-28 LAB — GLUCOSE, CAPILLARY
Glucose-Capillary: 173 mg/dL — ABNORMAL HIGH (ref 70–99)
Glucose-Capillary: 189 mg/dL — ABNORMAL HIGH (ref 70–99)
Glucose-Capillary: 191 mg/dL — ABNORMAL HIGH (ref 70–99)
Glucose-Capillary: 198 mg/dL — ABNORMAL HIGH (ref 70–99)
Glucose-Capillary: 225 mg/dL — ABNORMAL HIGH (ref 70–99)
Glucose-Capillary: 227 mg/dL — ABNORMAL HIGH (ref 70–99)

## 2019-08-28 LAB — URINALYSIS, ROUTINE W REFLEX MICROSCOPIC
Bilirubin Urine: NEGATIVE
Glucose, UA: NEGATIVE mg/dL
Hgb urine dipstick: NEGATIVE
Ketones, ur: NEGATIVE mg/dL
Nitrite: NEGATIVE
Protein, ur: NEGATIVE mg/dL
Specific Gravity, Urine: 1.02 (ref 1.005–1.030)
pH: 6 (ref 5.0–8.0)

## 2019-08-28 LAB — CBC
HCT: 26.2 % — ABNORMAL LOW (ref 39.0–52.0)
Hemoglobin: 7.9 g/dL — ABNORMAL LOW (ref 13.0–17.0)
MCH: 33.2 pg (ref 26.0–34.0)
MCHC: 30.2 g/dL (ref 30.0–36.0)
MCV: 110.1 fL — ABNORMAL HIGH (ref 80.0–100.0)
Platelets: 371 10*3/uL (ref 150–400)
RBC: 2.38 MIL/uL — ABNORMAL LOW (ref 4.22–5.81)
RDW: 19.2 % — ABNORMAL HIGH (ref 11.5–15.5)
WBC: 7 10*3/uL (ref 4.0–10.5)
nRBC: 0.3 % — ABNORMAL HIGH (ref 0.0–0.2)

## 2019-08-28 MED ORDER — HYDRALAZINE HCL 50 MG PO TABS
50.0000 mg | ORAL_TABLET | Freq: Three times a day (TID) | ORAL | Status: DC
  Administered 2019-08-28 – 2019-09-05 (×26): 50 mg via ORAL
  Filled 2019-08-28 (×27): qty 1

## 2019-08-28 MED ORDER — NYSTATIN 100000 UNIT/ML MT SUSP
5.0000 mL | Freq: Four times a day (QID) | OROMUCOSAL | Status: AC
  Administered 2019-08-28 – 2019-09-04 (×28): 500000 [IU] via ORAL
  Filled 2019-08-28 (×27): qty 5

## 2019-08-28 MED ORDER — FENTANYL CITRATE (PF) 100 MCG/2ML IJ SOLN
25.0000 ug | INTRAMUSCULAR | Status: DC | PRN
  Administered 2019-08-28: 50 ug via INTRAVENOUS
  Filled 2019-08-28: qty 2

## 2019-08-28 MED ORDER — FENTANYL CITRATE (PF) 100 MCG/2ML IJ SOLN
25.0000 ug | INTRAMUSCULAR | Status: DC | PRN
  Administered 2019-08-28 – 2019-09-02 (×3): 50 ug via INTRAVENOUS
  Administered 2019-09-02: 25 ug via INTRAVENOUS
  Administered 2019-09-03 (×2): 50 ug via INTRAVENOUS
  Filled 2019-08-28 (×5): qty 2

## 2019-08-28 MED ORDER — CLONIDINE HCL 0.2 MG PO TABS: 0.2000 mg | ORAL_TABLET | Freq: Three times a day (TID) | ORAL | Status: DC

## 2019-08-28 MED ORDER — FOLIC ACID 1 MG PO TABS
1.0000 mg | ORAL_TABLET | Freq: Every day | ORAL | Status: DC
  Administered 2019-08-28 – 2019-09-18 (×22): 1 mg
  Filled 2019-08-28 (×22): qty 1

## 2019-08-28 MED ORDER — OXYCODONE HCL 5 MG PO TABS: 5.0000 mg | ORAL_TABLET | ORAL | Status: DC

## 2019-08-28 MED ORDER — OXYCODONE HCL 5 MG PO TABS
5.0000 mg | ORAL_TABLET | ORAL | Status: DC | PRN
  Administered 2019-08-28 – 2019-09-07 (×8): 5 mg via ORAL
  Filled 2019-08-28 (×9): qty 1

## 2019-08-28 MED ORDER — FENTANYL CITRATE (PF) 100 MCG/2ML IJ SOLN
INTRAMUSCULAR | Status: AC
  Filled 2019-08-28: qty 2

## 2019-08-28 MED ORDER — METOPROLOL TARTRATE 100 MG PO TABS
100.0000 mg | ORAL_TABLET | Freq: Two times a day (BID) | ORAL | Status: DC
  Administered 2019-08-28 – 2019-09-19 (×45): 100 mg
  Filled 2019-08-28 (×38): qty 1
  Filled 2019-08-28: qty 8
  Filled 2019-08-28 (×8): qty 1

## 2019-08-28 NOTE — Progress Notes (Signed)
Wife updated by phone.

## 2019-08-28 NOTE — Progress Notes (Signed)
Assisted tele visit to patient with family member.  Rydge Texidor P, RN  

## 2019-08-28 NOTE — Progress Notes (Signed)
eLink Physician-Brief Progress Note Patient Name: [name] DOB: March 05, 1951 MRN: [id number]   Date of Service  08/28/2019  HPI/Events of Note  Febrile  eICU Interventions  In discussion with RN, will add cultures (blood, urine, sputum) to prior orders.     Intervention Category Intermediate Interventions: Infection - evaluation and management  Marily Lente Trudi Morgenthaler 08/28/2019, 12:45 AM
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
## 2019-08-28 NOTE — Progress Notes (Signed)
eLink Physician-Brief Progress Note Patient Name: [name] DOB: 03-21-1951 MRN: [id number]   Date of Service  08/28/2019  HPI/Events of Note  Trach site pain. Already received Tylenol.  eICU Interventions  Will order 25-50 mcg IV fentanyl Q3H PRN.     Intervention Category Intermediate Interventions: Pain - evaluation and management  Marily Lente Keshana Klemz 08/28/2019, 5:49 AM

## 2019-08-28 NOTE — Progress Notes (Addendum)
Patient able to open eyes and nod after repeated questioning. Nodded yes to feeling pain. Patient is tachypneic, coughing, grimacing and very tense in all extremities. Difficult to say where/why he is hurting at this time. Would assume pain from trach/throat. Pt does have wound on sacrum, but pressure has been off loaded entire shift. No pain medications ordered at this time due to previous mental status. However, he continues to open eyes and appears very uncomfortable. [place] notified.

## 2019-08-29 LAB — BASIC METABOLIC PANEL
Anion gap: 10 (ref 5–15)
BUN: 43 mg/dL — ABNORMAL HIGH (ref 8–23)
CO2: 25 mmol/L (ref 22–32)
Calcium: 9.7 mg/dL (ref 8.9–10.3)
Chloride: 114 mmol/L — ABNORMAL HIGH (ref 98–111)
Creatinine, Ser: 0.67 mg/dL (ref 0.61–1.24)
GFR calc Af Amer: >60 mL/min (ref >60)
GFR calc non Af Amer: >60 mL/min (ref >60)
Glucose, Bld: 152 mg/dL — ABNORMAL HIGH (ref 70–99)
Potassium: 5.1 mmol/L (ref 3.5–5.1)
Sodium: 149 mmol/L — ABNORMAL HIGH (ref 135–145)

## 2019-08-29 LAB — GLUCOSE, CAPILLARY
Glucose-Capillary: 118 mg/dL — ABNORMAL HIGH (ref 70–99)
Glucose-Capillary: 132 mg/dL — ABNORMAL HIGH (ref 70–99)
Glucose-Capillary: 137 mg/dL — ABNORMAL HIGH (ref 70–99)
Glucose-Capillary: 148 mg/dL — ABNORMAL HIGH (ref 70–99)
Glucose-Capillary: 159 mg/dL — ABNORMAL HIGH (ref 70–99)
Glucose-Capillary: 164 mg/dL — ABNORMAL HIGH (ref 70–99)
Glucose-Capillary: 189 mg/dL — ABNORMAL HIGH (ref 70–99)

## 2019-08-29 LAB — CBC
HCT: 25.4 % — ABNORMAL LOW (ref 39.0–52.0)
Hemoglobin: 7.7 g/dL — ABNORMAL LOW (ref 13.0–17.0)
MCH: 33.9 pg (ref 26.0–34.0)
MCHC: 30.3 g/dL (ref 30.0–36.0)
MCV: 111.9 fL — ABNORMAL HIGH (ref 80.0–100.0)
Platelets: 352 10*3/uL (ref 150–400)
RBC: 2.27 MIL/uL — ABNORMAL LOW (ref 4.22–5.81)
RDW: 19.8 % — ABNORMAL HIGH (ref 11.5–15.5)
WBC: 6.4 10*3/uL (ref 4.0–10.5)
nRBC: 0.5 % — ABNORMAL HIGH (ref 0.0–0.2)

## 2019-08-29 LAB — MAGNESIUM: Magnesium: 2.6 mg/dL — ABNORMAL HIGH (ref 1.7–2.4)

## 2019-08-29 MED ORDER — POLYVINYL ALCOHOL 1.4 % OP SOLN
1.0000 [drp] | OPHTHALMIC | Status: DC | PRN
  Administered 2019-09-05 – 2019-09-17 (×5): 1 [drp] via OPHTHALMIC
  Filled 2019-08-29: qty 15

## 2019-08-29 MED ORDER — PSYLLIUM 95 % PO PACK
1.0000 | PACK | Freq: Two times a day (BID) | ORAL | Status: DC
  Administered 2019-08-29 – 2019-09-18 (×39): 1
  Filled 2019-08-29 (×41): qty 1

## 2019-08-29 MED ORDER — VITAL 1.5 CAL PO LIQD
1000.0000 mL | ORAL | Status: AC
  Administered 2019-08-29 – 2019-09-07 (×11): 1000 mL
  Filled 2019-08-29 (×17): qty 1000
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
## 2019-08-29 NOTE — Consult Note (Signed)
[place] Nurse wound follow up Patient receiving care in [facility] 2M16.  Assisted with turning by primary RN. Daughter in room and updated her on the plan of care. Wound type: unstageable PI to coccyx/sacrum with linear DTPI to the left buttock and below this main wound. Measurement: 5.6 cm x 7 cm Wound bed: 100% dry brown and yellow slough Drainage (amount, consistency, odor) none Periwound: beginning to breakdown with scattered DTPI in various places around the main wound Dressing procedure/placement/frequency: I have placed an order for PT hydrotherapy to the area followed by santyl and saline moistened gauze.  I am hopeful PT will be able to perform hydrotherapy 6 days per week. Monitor the wound area(s) for worsening of condition such as: Signs/symptoms of infection,  Increase in size,  Development of or worsening of odor, Development of pain, or increased pain at the affected locations.  Notify the medical team if any of these develop. Val Riles, RN, MSN, CWOCN, CNS-BC, pager [phone]

## 2019-08-29 NOTE — Progress Notes (Signed)
RT note: patient placed back on ventilator due to having a decrease in sats to 70s.  Increased FIO2 to 100% due to not responding to suctioning and a couple of oxygen breaths.  Will inform unit RT.  RN aware as well.  Will continue to monitor.

## 2019-08-29 NOTE — Care Management (Addendum)
CM reached back out to [facility] per request of pts wife.  [facility] to review case to determine if a bed can be offered  Update:  [facility] has agreed to offer a bed pending insurance auth.  Wife remains in agreement.  [facility] to initiate auth today.  [facility] will require pt to remain afebrile for 3 days prior to admit

## 2019-08-30 LAB — CBC
HCT: 25.2 % — ABNORMAL LOW (ref 39.0–52.0)
Hemoglobin: 7.7 g/dL — ABNORMAL LOW (ref 13.0–17.0)
MCH: 34.2 pg — ABNORMAL HIGH (ref 26.0–34.0)
MCHC: 30.6 g/dL (ref 30.0–36.0)
MCV: 112 fL — ABNORMAL HIGH (ref 80.0–100.0)
Platelets: 378 10*3/uL (ref 150–400)
RBC: 2.25 MIL/uL — ABNORMAL LOW (ref 4.22–5.81)
RDW: 20.4 % — ABNORMAL HIGH (ref 11.5–15.5)
WBC: 6 10*3/uL (ref 4.0–10.5)
nRBC: 0 % (ref 0.0–0.2)

## 2019-08-30 LAB — GLUCOSE, CAPILLARY
Glucose-Capillary: 195 mg/dL — ABNORMAL HIGH (ref 70–99)
Glucose-Capillary: 172 mg/dL — ABNORMAL HIGH (ref 70–99)
Glucose-Capillary: 238 mg/dL — ABNORMAL HIGH (ref 70–99)
Glucose-Capillary: 270 mg/dL — ABNORMAL HIGH (ref 70–99)
Glucose-Capillary: 189 mg/dL — ABNORMAL HIGH (ref 70–99)
Glucose-Capillary: 190 mg/dL — ABNORMAL HIGH (ref 70–99)

## 2019-08-30 LAB — CULTURE, RESPIRATORY W GRAM STAIN: Culture: NORMAL

## 2019-08-30 LAB — TSH: TSH: 2.602 u[IU]/mL (ref 0.350–4.500)

## 2019-08-30 NOTE — Progress Notes (Signed)
Nutrition Follow-up  DOCUMENTATION CODES:   Obesity unspecified  INTERVENTION:   Continue TF via Cortrak tube: - Vital 1.5 @ 65 ml/hr (1560 ml per day) - Pro-stat 60 ml BID - Free water 200 ml QID  Tube feeding regimen and current free water provides 2740 kcal, 165 grams protein, and 1992 ml free water daily.  - Continue Juven BID via tube, each packet provides 80 calories, 8 grams of carbohydrate, 2.5grams of protein (collagen), 7 grams of L-arginine and 7 grams of L-glutamine; supplement contains CaHMB, Vitamins C, E, B12 and Zinc to promote wound healing  NUTRITION DIAGNOSIS:   Increased nutrient needs related to acute illness (COVID-19 positive) as evidenced by estimated needs.  Ongoing  GOAL:   Provide needs based on ASPEN/SCCM guidelines  Met via TF  MONITOR:   Vent status, Labs, Weight trends, TF tolerance, Skin  ASSESSMENT:   68 y.o. male with medical history of DM, HTN, PVD s/p right BKA, BPH, chronic pain syndrome, COPD, and hyperlipidemia. He presented to the ED on 11/19 due to SOB, cough, wheezing, and confusion. He was diagnosed with COVID-19 on 11/15. He was 71% on RA when EMS arrived and he was placed on NRB. In the ED he had a temperature of 102.1. CXR showed multifocal PNA and pattern consistent with COVID-19.  11/25 - Cortrak tube placed, tip in stomach 12/04 - s/p trach by ENT  Patient on trach collar some today. He is not following commands despite having no pain meds for several days.   Plans for LTACH placement.  Cortrak remains in place with TF infusing. Spoke with RN who reports pt is tolerating TF without issue.  Current TF regimen: Vital 1.5 @ 65 ml/hr, free water 200 ml q 6 hours, Pro-stat 60 ml BID  Patient is currently on ventilator support via trach. MV: 14.6 L/min Temp (24hrs), Avg:100.4 F (38 C), Min:99.8 F (37.7 C), Max:100.9 F (38.3 C)   Medications reviewed.  Labs reviewed. CBG's: [phone]  Weight down 1.5 kg over  the last week. Per RN edema assessment, pt with moderate pitting edema to BUE and LLE, mild pitting edema to RLE. I/O + 19.3 L since admission  Diet Order:   Diet Order            Diet NPO time specified  Diet effective now              EDUCATION NEEDS:   No education needs have been identified at this time  Skin:  Skin Assessment: Skin Integrity Issues: Skin Integrity Issues: DTI: sacrum Stage II: left ear Incisions: neck Other: skin tear to right arm  Last BM:  12/15 Rectal tube  Height:   Ht Readings from Last 1 Encounters:  08/05/19 '6\' 1"'  (1.854 m)    Weight:   Wt Readings from Last 1 Encounters:  08/30/19 117.7 kg    Ideal Body Weight:  78.2 kg (adjusted for BKA)  BMI:  Body mass index is 34.23 kg/m.  Estimated Nutritional Needs:   Kcal:  2500-3704  Protein:  >/= 167 grams  Fluid:  >/= 2.6 L    Molli Barrows, RD, LDN, [place] Pager [phone] After Hours Pager [phone]

## 2019-08-30 NOTE — Progress Notes (Signed)
Physical Therapy Wound Evaluation/Treatment Patient Details  Name: [name] MRN: [id number] Date of Birth: 15-Dec-1950  Today's Date: 08/30/2019 Time: 1015-1100 Time Calculation (min): 45 min  Subjective  Subjective: Pt not verbally responding during session. Only responsive to pain - eyes closed throughout session.  Patient and Family Stated Goals: None stated, no family present.   Pain Score:  Pt only responding to pain at this time - restless throughout treatment but overall tolerated well.   Wound Assessment  Pressure Injury 08/10/19 Sacrum Mid Deep Tissue Injury - Purple or maroon localized area of discolored intact skin or blood-filled blister due to damage of underlying soft tissue from pressure and/or shear. (Active)  Dressing Type ABD;Barrier Film (skin prep);Gauze (Comment);Moist to dry 08/30/19 1341  Dressing Changed;Clean;Dry;Intact 08/30/19 1341  Dressing Change Frequency Daily 08/30/19 1341  State of Healing Eschar 08/30/19 1341  Site / Wound Assessment Black;Yellow;Pink 08/30/19 1341  % Wound base Red or Granulating 10% 08/30/19 1341  % Wound base Yellow/Fibrinous Exudate 20% 08/30/19 1341  % Wound base Black/Eschar 70% 08/30/19 1341  % Wound base Other/Granulation Tissue (Comment) 20% 08/25/19 2000  Peri-wound Assessment Intact;Erythema (blanchable);Maceration 08/30/19 1341  Wound Length (cm) 5.6 cm 08/30/19 1341  Wound Width (cm) 7 cm 08/30/19 1341  Wound Depth (cm) 0.5 cm 08/30/19 1341  Wound Surface Area (cm^2) 39.2 cm^2 08/30/19 1341  Wound Volume (cm^3) 19.6 cm^3 08/30/19 1341  Margins Unattached edges (unapproximated) 08/30/19 1341  Drainage Amount Minimal 08/30/19 1341  Drainage Description Serosanguineous 08/30/19 1341  Treatment Debridement (Selective);Hydrotherapy (Pulse lavage);Packing (Saline gauze) 08/30/19 1341   Santyl applied to wound bed prior to applying dressing.     Hydrotherapy Pulsed lavage therapy - wound location: Sacrum Pulsed  Lavage with Suction (psi): 12 psi Pulsed Lavage with Suction - Normal Saline Used: 1000 mL Pulsed Lavage Tip: Tip with splash shield Selective Debridement Selective Debridement - Location: Sacrum Selective Debridement - Tools Used: Forceps;Scalpel;Scissors Selective Debridement - Tissue Removed: Black and yellow eschar/necrosis   Wound Assessment and Plan  Wound Therapy - Assess/Plan/Recommendations Wound Therapy - Clinical Statement: Pt presents to hydrotherapy with an unstagable pressure injury. Eschar was soft and able to be debrided this session however will benefit from continued hydrotherapy for selective removal of unviable tissue, to decrease bioburden and promote wound bed healing. Recommend Cleanse Choice VAC to encourage granulation tissue, and to limit time spent in sidelying until pt can tolerate better with hydro sessions.  Wound Therapy - Functional Problem List: Global weakness in the setting of critical illness.  Factors Delaying/Impairing Wound Healing: Immobility;Multiple medical problems Hydrotherapy Plan: Debridement;Dressing change;Patient/family education;Pulsatile lavage with suction Wound Therapy - Frequency: 6X / week Wound Therapy - Follow Up Recommendations: Skilled nursing facility Wound Plan: See above  Wound Therapy Goals- Improve the function of patient's integumentary system by progressing the wound(s) through the phases of wound healing (inflammation - proliferation - remodeling) by: Decrease Necrotic Tissue to: 0 Decrease Necrotic Tissue - Progress: Goal set today Increase Granulation Tissue to: 100 Increase Granulation Tissue - Progress: Goal set today Goals/treatment plan/discharge plan were made with and agreed upon by patient/family: No, Patient unable to participate in goals/treatment/discharge plan and family unavailable Time For Goal Achievement: 7 days Wound Therapy - Potential for Goals: Good  Goals will be updated until maximal potential  achieved or discharge criteria met.  Discharge criteria: when goals achieved, discharge from hospital, MD decision/surgical intervention, no progress towards goals, refusal/missing three consecutive treatments without notification or medical reason.  GP  Thelma Comp 08/30/2019, 2:29 PM   Rolinda Roan, PT, DPT Acute Rehabilitation Services Pager: [phone] Office: [phone]
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
## 2019-08-30 NOTE — Progress Notes (Signed)
RT NOTES: Placed patient back on vent on full support due to patient desaturating to 86%. Will continue to monitor.

## 2019-08-31 LAB — GLUCOSE, CAPILLARY
Glucose-Capillary: 176 mg/dL — ABNORMAL HIGH (ref 70–99)
Glucose-Capillary: 183 mg/dL — ABNORMAL HIGH (ref 70–99)
Glucose-Capillary: 184 mg/dL — ABNORMAL HIGH (ref 70–99)
Glucose-Capillary: 198 mg/dL — ABNORMAL HIGH (ref 70–99)
Glucose-Capillary: 202 mg/dL — ABNORMAL HIGH (ref 70–99)
Glucose-Capillary: 226 mg/dL — ABNORMAL HIGH (ref 70–99)

## 2019-08-31 LAB — CBC
HCT: 25 % — ABNORMAL LOW (ref 39.0–52.0)
Hemoglobin: 7.4 g/dL — ABNORMAL LOW (ref 13.0–17.0)
MCH: 33.8 pg (ref 26.0–34.0)
MCHC: 29.6 g/dL — ABNORMAL LOW (ref 30.0–36.0)
MCV: 114.2 fL — ABNORMAL HIGH (ref 80.0–100.0)
Platelets: 362 10*3/uL (ref 150–400)
RBC: 2.19 MIL/uL — ABNORMAL LOW (ref 4.22–5.81)
RDW: 21.2 % — ABNORMAL HIGH (ref 11.5–15.5)
WBC: 5.5 10*3/uL (ref 4.0–10.5)
nRBC: 0 % (ref 0.0–0.2)

## 2019-08-31 LAB — BASIC METABOLIC PANEL
Anion gap: 6 (ref 5–15)
BUN: 48 mg/dL — ABNORMAL HIGH (ref 8–23)
CO2: 29 mmol/L (ref 22–32)
Calcium: 9.8 mg/dL (ref 8.9–10.3)
Chloride: 116 mmol/L — ABNORMAL HIGH (ref 98–111)
Creatinine, Ser: 0.95 mg/dL (ref 0.61–1.24)
GFR calc Af Amer: >60 mL/min (ref >60)
GFR calc non Af Amer: >60 mL/min (ref >60)
Glucose, Bld: 205 mg/dL — ABNORMAL HIGH (ref 70–99)
Potassium: 4.3 mmol/L (ref 3.5–5.1)
Sodium: 151 mmol/L — ABNORMAL HIGH (ref 135–145)

## 2019-08-31 MED ORDER — FREE WATER
200.0000 mL | Status: DC
  Administered 2019-08-31 – 2019-09-02 (×13): 200 mL

## 2019-08-31 MED ORDER — ACETAMINOPHEN 160 MG/5ML PO SOLN
650.0000 mg | Freq: Three times a day (TID) | ORAL | Status: DC
  Administered 2019-08-31 – 2019-09-03 (×10): 650 mg
  Filled 2019-08-31 (×10): qty 20.3

## 2019-08-31 MED ORDER — INSULIN DETEMIR 100 UNIT/ML ~~LOC~~ SOLN
50.0000 [IU] | Freq: Two times a day (BID) | SUBCUTANEOUS | Status: DC
  Administered 2019-08-31 – 2019-09-08 (×16): 50 [IU] via SUBCUTANEOUS
  Filled 2019-08-31 (×21): qty 0.5
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
## 2019-08-31 NOTE — Progress Notes (Signed)
.  Assisted tele visit to patient with wife Celicia Minahan, Philis Nettle, RN

## 2019-08-31 NOTE — Progress Notes (Signed)
Physical Therapy Wound Treatment Patient Details  Name: [name] MRN: [id number] Date of Birth: 24-Jan-1951  Today's Date: 08/31/2019 Time: 1015-1130 Time Calculation (min): 75 min  Subjective  Subjective: Pt not verbally responding during session. Only responsive to pain - eyes closed throughout session.  Patient and Family Stated Goals: None stated, no family present.   Pain Score:  Pt restless at times but overall tolerated treatment well.   Wound Assessment  Pressure Injury 08/10/19 Sacrum Mid Deep Tissue Injury - Purple or maroon localized area of discolored intact skin or blood-filled blister due to damage of underlying soft tissue from pressure and/or shear. (Active)  Wound Image   08/31/19 1200  Dressing Type Negative pressure wound therapy 08/31/19 1200  Dressing Changed;Clean;Dry;Intact 08/31/19 1200  Dressing Change Frequency Daily 08/31/19 1200  State of Healing Eschar 08/31/19 1200  Site / Wound Assessment Yellow;Pink;Black 08/31/19 1200  % Wound base Red or Granulating 10% 08/31/19 1200  % Wound base Yellow/Fibrinous Exudate 80% 08/31/19 1200  % Wound base Black/Eschar 10% 08/31/19 1200  % Wound base Other/Granulation Tissue (Comment) 0% 08/31/19 1200  Peri-wound Assessment Intact;Erythema (blanchable);Maceration 08/31/19 1200  Wound Length (cm) 5.6 cm 08/30/19 1341  Wound Width (cm) 7 cm 08/30/19 1341  Wound Depth (cm) 0.5 cm 08/30/19 1341  Wound Surface Area (cm^2) 39.2 cm^2 08/30/19 1341  Wound Volume (cm^3) 19.6 cm^3 08/30/19 1341  Margins Unattached edges (unapproximated) 08/31/19 1200  Drainage Amount Minimal 08/31/19 1200  Drainage Description Serosanguineous 08/31/19 1200  Treatment Negative pressure wound therapy 08/31/19 1200     Negative Pressure Wound Therapy Sacrum Mid (Active)  Last dressing change 08/31/19 08/31/19 1200  Wound filler - Black foam 2 08/31/19 1200  Cycle Continuous 08/31/19 1200  Target Pressure (mmHg) 125 08/31/19 1200    Instillation Volume 18 mL 08/31/19 1200  Instillation Solution Normal Saline 08/31/19 1200  Instillation Soak Time 10 minutes 08/31/19 1200  Instillation Therapy Time 3.5 hours 08/31/19 1200  Canister Changed No 08/31/19 1200  Dressing Status Intact 08/31/19 1200      Selective Debridement Selective Debridement - Location: Sacrum Selective Debridement - Tools Used: Forceps;Scissors Selective Debridement - Tissue Removed: Black and yellow eschar/necrosis   Wound Assessment and Plan  Wound Therapy - Assess/Plan/Recommendations Wound Therapy - Clinical Statement: Hydrotherapy placed Cleanse Choice VeraFlow Wound VAC this session to encourage granulation and soften necrosis for debridement next session. Hydrotherapy will continue to follow, changing the VAC and debriding 3x/week, typically MWF.  Wound Therapy - Functional Problem List: Global weakness in the setting of critical illness.  Factors Delaying/Impairing Wound Healing: Immobility;Multiple medical problems Hydrotherapy Plan: Debridement;Dressing change;Patient/family education;Pulsatile lavage with suction Wound Therapy - Frequency: 6X / week Wound Therapy - Follow Up Recommendations: Skilled nursing facility(vs LTACH) Wound Plan: See above  Wound Therapy Goals- Improve the function of patient's integumentary system by progressing the wound(s) through the phases of wound healing (inflammation - proliferation - remodeling) by: Decrease Necrotic Tissue to: 0 Decrease Necrotic Tissue - Progress: Progressing toward goal Increase Granulation Tissue to: 100 Increase Granulation Tissue - Progress: Progressing toward goal Goals/treatment plan/discharge plan were made with and agreed upon by patient/family: No, Patient unable to participate in goals/treatment/discharge plan and family unavailable Time For Goal Achievement: 7 days Wound Therapy - Potential for Goals: Good  Goals will be updated until maximal potential achieved or  discharge criteria met.  Discharge criteria: when goals achieved, discharge from hospital, MD decision/surgical intervention, no progress towards goals, refusal/missing three consecutive treatments without notification  or medical reason.  GP     Thelma Comp 08/31/2019, 12:37 PM   Rolinda Roan, PT, DPT Acute Rehabilitation Services Pager: [phone] Office: [phone]

## 2019-08-31 NOTE — Care Management (Signed)
CM informed that [facility] is requesting Peer to Peer for [facility] consideration.  Attending contacted and has agreed tto call [phone] option 5 prior to 3:30pm today.

## 2019-09-01 LAB — GLUCOSE, CAPILLARY
Glucose-Capillary: 126 mg/dL — ABNORMAL HIGH (ref 70–99)
Glucose-Capillary: 135 mg/dL — ABNORMAL HIGH (ref 70–99)
Glucose-Capillary: 145 mg/dL — ABNORMAL HIGH (ref 70–99)
Glucose-Capillary: 172 mg/dL — ABNORMAL HIGH (ref 70–99)
Glucose-Capillary: 182 mg/dL — ABNORMAL HIGH (ref 70–99)
Glucose-Capillary: 211 mg/dL — ABNORMAL HIGH (ref 70–99)

## 2019-09-01 LAB — CBC
HCT: 25.6 % — ABNORMAL LOW (ref 39.0–52.0)
Hemoglobin: 7.6 g/dL — ABNORMAL LOW (ref 13.0–17.0)
MCH: 34.7 pg — ABNORMAL HIGH (ref 26.0–34.0)
MCHC: 29.7 g/dL — ABNORMAL LOW (ref 30.0–36.0)
MCV: 116.9 fL — ABNORMAL HIGH (ref 80.0–100.0)
Platelets: 350 10*3/uL (ref 150–400)
RBC: 2.19 MIL/uL — ABNORMAL LOW (ref 4.22–5.81)
RDW: 21.8 % — ABNORMAL HIGH (ref 11.5–15.5)
WBC: 5.5 10*3/uL (ref 4.0–10.5)
nRBC: 0 % (ref 0.0–0.2)

## 2019-09-01 LAB — BASIC METABOLIC PANEL
Anion gap: 7 (ref 5–15)
BUN: 49 mg/dL — ABNORMAL HIGH (ref 8–23)
CO2: 29 mmol/L (ref 22–32)
Calcium: 9.8 mg/dL (ref 8.9–10.3)
Chloride: 116 mmol/L — ABNORMAL HIGH (ref 98–111)
Creatinine, Ser: 0.69 mg/dL (ref 0.61–1.24)
GFR calc Af Amer: >60 mL/min (ref >60)
GFR calc non Af Amer: >60 mL/min (ref >60)
Glucose, Bld: 225 mg/dL — ABNORMAL HIGH (ref 70–99)
Potassium: 4.3 mmol/L (ref 3.5–5.1)
Sodium: 152 mmol/L — ABNORMAL HIGH (ref 135–145)

## 2019-09-01 MED ORDER — INSULIN ASPART 100 UNIT/ML ~~LOC~~ SOLN
12.0000 [IU] | SUBCUTANEOUS | Status: DC
  Administered 2019-09-01 – 2019-09-12 (×53): 12 [IU] via SUBCUTANEOUS

## 2019-09-01 NOTE — Progress Notes (Signed)
Video call with wife completed

## 2019-09-01 NOTE — Progress Notes (Signed)
Patient placed on 60% ATC. No complications. Vitals stable at this time. RT will continue to monitor.

## 2019-09-01 NOTE — Care Management (Signed)
[employer] has started manual appeal process with pts insurance for [facility] approval
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
## 2019-09-02 LAB — CBC
HCT: 26 % — ABNORMAL LOW (ref 39.0–52.0)
Hemoglobin: 7.8 g/dL — ABNORMAL LOW (ref 13.0–17.0)
MCH: 34.4 pg — ABNORMAL HIGH (ref 26.0–34.0)
MCHC: 30 g/dL (ref 30.0–36.0)
MCV: 114.5 fL — ABNORMAL HIGH (ref 80.0–100.0)
Platelets: 344 10*3/uL (ref 150–400)
RBC: 2.27 MIL/uL — ABNORMAL LOW (ref 4.22–5.81)
RDW: 22 % — ABNORMAL HIGH (ref 11.5–15.5)
WBC: 6.1 10*3/uL (ref 4.0–10.5)
nRBC: 0 % (ref 0.0–0.2)

## 2019-09-02 LAB — CULTURE, BLOOD (ROUTINE X 2)
Culture: NO GROWTH
Culture: NO GROWTH

## 2019-09-02 LAB — BASIC METABOLIC PANEL
Anion gap: 7 (ref 5–15)
BUN: 45 mg/dL — ABNORMAL HIGH (ref 8–23)
CO2: 30 mmol/L (ref 22–32)
Calcium: 10 mg/dL (ref 8.9–10.3)
Chloride: 117 mmol/L — ABNORMAL HIGH (ref 98–111)
Creatinine, Ser: 0.63 mg/dL (ref 0.61–1.24)
GFR calc Af Amer: >60 mL/min (ref >60)
GFR calc non Af Amer: >60 mL/min (ref >60)
Glucose, Bld: 126 mg/dL — ABNORMAL HIGH (ref 70–99)
Potassium: 4.3 mmol/L (ref 3.5–5.1)
Sodium: 154 mmol/L — ABNORMAL HIGH (ref 135–145)

## 2019-09-02 LAB — GLUCOSE, CAPILLARY
Glucose-Capillary: 125 mg/dL — ABNORMAL HIGH (ref 70–99)
Glucose-Capillary: 134 mg/dL — ABNORMAL HIGH (ref 70–99)
Glucose-Capillary: 142 mg/dL — ABNORMAL HIGH (ref 70–99)
Glucose-Capillary: 140 mg/dL — ABNORMAL HIGH (ref 70–99)
Glucose-Capillary: 129 mg/dL — ABNORMAL HIGH (ref 70–99)

## 2019-09-02 MED ORDER — FREE WATER
300.0000 mL | Status: DC
  Administered 2019-09-02 – 2019-09-07 (×28): 300 mL
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
## 2019-09-02 NOTE — Progress Notes (Addendum)
Physical Therapy Wound Treatment Patient Details  Name: [name] MRN: [id number] Date of Birth: 02-Sep-1951  Today's Date: 09/02/2019 Time: 0973-5329 Time Calculation (min): 56 min  Subjective  Subjective: Pt awake with eyes open and he was able toi nod had to answer questions intermittently.  Pt able toi report pain when asked and RN medicated during session  Patient and Family Stated Goals: None stated, no family present.   Pain Score:  FACES 6/10 RN into medicate patient during session.    Wound Assessment     Pressure Injury 08/10/19 Sacrum Mid Deep Tissue Injury - Purple or maroon localized area of discolored intact skin or blood-filled blister due to damage of underlying soft tissue from pressure and/or shear. (Active)  Wound Image   09/02/19 1446  Dressing Type Negative pressure wound therapy 09/02/19 1446  Dressing Changed;Clean;Dry;Intact 09/02/19 1446  Dressing Change Frequency Daily 09/02/19 1446  State of Healing Eschar 09/02/19 1446  Site / Wound Assessment Yellow;Pink;Black 09/02/19 1446  % Wound base Red or Granulating 10% 09/02/19 1446  % Wound base Yellow/Fibrinous Exudate 80% 09/02/19 1446  % Wound base Black/Eschar 10% 09/02/19 1446  % Wound base Other/Granulation Tissue (Comment) 0% 09/02/19 1446  Peri-wound Assessment Intact;Erythema (blanchable);Maceration 09/02/19 1446  Wound Length (cm) 5.6 cm 08/30/19 1341  Wound Width (cm) 7 cm 08/30/19 1341  Wound Depth (cm) 0.5 cm 08/30/19 1341  Wound Surface Area (cm^2) 39.2 cm^2 08/30/19 1341  Wound Volume (cm^3) 19.6 cm^3 08/30/19 1341  Margins Unattached edges (unapproximated) 09/02/19 1446  Drainage Amount Minimal 09/02/19 1446  Drainage Description Serosanguineous 09/02/19 1446  Treatment Negative pressure wound therapy 09/02/19 1446     Negative Pressure Wound Therapy Sacrum Mid (Active)  Last dressing change 09/02/19 09/02/19 1446  Site / Wound Assessment Dressing in place / Unable to assess 09/02/19  0800  Wound filler - Black foam 2 09/02/19 1446  Cycle Continuous 09/02/19 1446  Target Pressure (mmHg) 125 09/02/19 1446  Instillation Volume 18 mL 09/02/19 1446  Instillation Solution Normal Saline 09/02/19 1446  Instillation Soak Time 10 minutes 09/02/19 1446  Instillation Therapy Time 3.5 hours 09/02/19 1446  Canister Changed No 09/02/19 1446  Dressing Status Intact 09/02/19 1446  Drainage Amount Minimal 09/02/19 1446  Drainage Description Serosanguineous 09/02/19 1446  Output (mL) 325 mL 09/02/19 1446         Selective Debridement Selective Debridement - Location: Sacrum Selective Debridement - Tools Used: Forceps;Scissors Selective Debridement - Tissue Removed: Black and yellow eschar/necrosis   Wound Assessment and Plan  Wound Therapy - Assess/Plan/Recommendations Wound Therapy - Clinical Statement: Hydrotherapy removed and replaced Cleanse Choice VeraFlow Wound VAC this session to encourage granulation and soften necrosis for debridement next session. Hydrotherapy will continue to follow, changing the VAC and debriding 3x/week, typically MWF.  Wound Therapy - Functional Problem List: Global weakness in the setting of critical illness.  Factors Delaying/Impairing Wound Healing: Immobility;Multiple medical problems Hydrotherapy Plan: Debridement;Dressing change;Patient/family education;Pulsatile lavage with suction Wound Therapy - Frequency: 3X / week Wound Therapy - Follow Up Recommendations: Skilled nursing facility(vs LTACH) Wound Plan: See above  Wound Therapy Goals- Improve the function of patient's integumentary system by progressing the wound(s) through the phases of wound healing (inflammation - proliferation - remodeling) by: Decrease Necrotic Tissue to: 0 Decrease Necrotic Tissue - Progress: Progressing toward goal Increase Granulation Tissue to: 100 Increase Granulation Tissue - Progress: Progressing toward goal Goals/treatment plan/discharge plan were made with  and agreed upon by patient/family: No, Patient unable to participate  in goals/treatment/discharge plan and family unavailable Time For Goal Achievement: 7 days Wound Therapy - Potential for Goals: Good  Goals will be updated until maximal potential achieved or discharge criteria met.  Discharge criteria: when goals achieved, discharge from hospital, MD decision/surgical intervention, no progress towards goals, refusal/missing three consecutive treatments without notification or medical reason.  GP     [name] 09/02/2019, 2:56 PM Erasmo Leventhal , PTA Acute Rehabilitation Services Pager [phone] Office [phone]

## 2019-09-03 DIAGNOSIS — L89000 Pressure ulcer of unspecified elbow, unstageable: Secondary | ICD-10-CM

## 2019-09-03 DIAGNOSIS — I129 Hypertensive chronic kidney disease with stage 1 through stage 4 chronic kidney disease, or unspecified chronic kidney disease: Secondary | ICD-10-CM

## 2019-09-03 DIAGNOSIS — E870 Hyperosmolality and hypernatremia: Secondary | ICD-10-CM

## 2019-09-03 DIAGNOSIS — E1122 Type 2 diabetes mellitus with diabetic chronic kidney disease: Secondary | ICD-10-CM

## 2019-09-03 LAB — GLUCOSE, CAPILLARY
Glucose-Capillary: 110 mg/dL — ABNORMAL HIGH (ref 70–99)
Glucose-Capillary: 142 mg/dL — ABNORMAL HIGH (ref 70–99)
Glucose-Capillary: 147 mg/dL — ABNORMAL HIGH (ref 70–99)
Glucose-Capillary: 151 mg/dL — ABNORMAL HIGH (ref 70–99)
Glucose-Capillary: 223 mg/dL — ABNORMAL HIGH (ref 70–99)
Glucose-Capillary: 95 mg/dL (ref 70–99)

## 2019-09-03 MED ORDER — PANTOPRAZOLE SODIUM 40 MG PO PACK
40.0000 mg | PACK | Freq: Every day | ORAL | Status: DC
  Administered 2019-09-04 – 2019-09-19 (×17): 40 mg
  Filled 2019-09-03 (×16): qty 20

## 2019-09-03 MED ORDER — FUROSEMIDE 40 MG PO TABS
40.0000 mg | ORAL_TABLET | Freq: Every day | ORAL | Status: DC
  Administered 2019-09-03 – 2019-09-09 (×7): 40 mg via ORAL
  Filled 2019-09-03 (×7): qty 1
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
## 2019-09-04 ENCOUNTER — Inpatient Hospital Stay (HOSPITAL_COMMUNITY): Payer: Medicare Other

## 2019-09-04 DIAGNOSIS — Z430 Encounter for attention to tracheostomy: Secondary | ICD-10-CM

## 2019-09-04 LAB — GLUCOSE, CAPILLARY
Glucose-Capillary: 145 mg/dL — ABNORMAL HIGH (ref 70–99)
Glucose-Capillary: 154 mg/dL — ABNORMAL HIGH (ref 70–99)
Glucose-Capillary: 159 mg/dL — ABNORMAL HIGH (ref 70–99)
Glucose-Capillary: 169 mg/dL — ABNORMAL HIGH (ref 70–99)
Glucose-Capillary: 224 mg/dL — ABNORMAL HIGH (ref 70–99)
Glucose-Capillary: 229 mg/dL — ABNORMAL HIGH (ref 70–99)

## 2019-09-04 NOTE — Progress Notes (Signed)
eLink Physician-Brief Progress Note Patient Name: [name] DOB: 1950-10-11 MRN: [id number]   Date of Service  09/04/2019  HPI/Events of Note  Notified of left facial droop. On further Inquiry patient also more somnolent. He is on Eliquis.  eICU Interventions  Ordered stat head CT     Intervention Category Major Interventions: Change in mental status - evaluation and management  Judd Lien 09/04/2019, 9:58 PM
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
## 2019-09-05 ENCOUNTER — Inpatient Hospital Stay (HOSPITAL_COMMUNITY): Payer: Medicare Other

## 2019-09-05 LAB — COMPREHENSIVE METABOLIC PANEL
ALT: 78 U/L — ABNORMAL HIGH (ref 0–44)
AST: 35 U/L (ref 15–41)
Albumin: 2.4 g/dL — ABNORMAL LOW (ref 3.5–5.0)
Alkaline Phosphatase: 81 U/L (ref 38–126)
Anion gap: 8 (ref 5–15)
BUN: 43 mg/dL — ABNORMAL HIGH (ref 8–23)
CO2: 27 mmol/L (ref 22–32)
Calcium: 9.7 mg/dL (ref 8.9–10.3)
Chloride: 114 mmol/L — ABNORMAL HIGH (ref 98–111)
Creatinine, Ser: 0.76 mg/dL (ref 0.61–1.24)
GFR calc Af Amer: >60 mL/min (ref >60)
GFR calc non Af Amer: >60 mL/min (ref >60)
Glucose, Bld: 202 mg/dL — ABNORMAL HIGH (ref 70–99)
Potassium: 4 mmol/L (ref 3.5–5.1)
Sodium: 149 mmol/L — ABNORMAL HIGH (ref 135–145)
Total Bilirubin: 0.5 mg/dL (ref 0.3–1.2)
Total Protein: 5.3 g/dL — ABNORMAL LOW (ref 6.5–8.1)

## 2019-09-05 LAB — GLUCOSE, CAPILLARY
Glucose-Capillary: 109 mg/dL — ABNORMAL HIGH (ref 70–99)
Glucose-Capillary: 127 mg/dL — ABNORMAL HIGH (ref 70–99)
Glucose-Capillary: 141 mg/dL — ABNORMAL HIGH (ref 70–99)
Glucose-Capillary: 151 mg/dL — ABNORMAL HIGH (ref 70–99)
Glucose-Capillary: 155 mg/dL — ABNORMAL HIGH (ref 70–99)
Glucose-Capillary: 76 mg/dL (ref 70–99)
Glucose-Capillary: 92 mg/dL (ref 70–99)

## 2019-09-05 LAB — CBC WITH DIFFERENTIAL/PLATELET
Abs Immature Granulocytes: 0.04 10*3/uL (ref 0.00–0.07)
Basophils Absolute: 0 10*3/uL (ref 0.0–0.1)
Basophils Relative: 1 %
Eosinophils Absolute: 0.1 10*3/uL (ref 0.0–0.5)
Eosinophils Relative: 1 %
HCT: 26.1 % — ABNORMAL LOW (ref 39.0–52.0)
Hemoglobin: 7.8 g/dL — ABNORMAL LOW (ref 13.0–17.0)
Immature Granulocytes: 1 %
Lymphocytes Relative: 18 %
Lymphs Abs: 1.5 10*3/uL (ref 0.7–4.0)
MCH: 34.2 pg — ABNORMAL HIGH (ref 26.0–34.0)
MCHC: 29.9 g/dL — ABNORMAL LOW (ref 30.0–36.0)
MCV: 114.5 fL — ABNORMAL HIGH (ref 80.0–100.0)
Monocytes Absolute: 0.8 10*3/uL (ref 0.1–1.0)
Monocytes Relative: 9 %
Neutro Abs: 6.1 10*3/uL (ref 1.7–7.7)
Neutrophils Relative %: 70 %
Platelets: 367 10*3/uL (ref 150–400)
RBC: 2.28 MIL/uL — ABNORMAL LOW (ref 4.22–5.81)
RDW: 23.3 % — ABNORMAL HIGH (ref 11.5–15.5)
WBC: 8.6 10*3/uL (ref 4.0–10.5)
nRBC: 0.4 % — ABNORMAL HIGH (ref 0.0–0.2)

## 2019-09-05 MED ORDER — MELATONIN 3 MG PO TABS
3.0000 mg | ORAL_TABLET | Freq: Every day | ORAL | Status: DC
  Administered 2019-09-06 – 2019-09-17 (×10): 3 mg
  Filled 2019-09-05 (×15): qty 1

## 2019-09-05 MED ORDER — HYDRALAZINE HCL 50 MG PO TABS
50.0000 mg | ORAL_TABLET | Freq: Three times a day (TID) | ORAL | Status: DC
  Administered 2019-09-06 – 2019-09-08 (×7): 50 mg
  Filled 2019-09-05 (×7): qty 1

## 2019-09-05 NOTE — Progress Notes (Signed)
Pt with small to moderate amount of coffee ground emesis and 87% sat while lying flat and on right side during bath. Tube feedings stopped and elevated pt hob. Pt suctioned for thick tan secretions. Sats resumed to 94% with suctioning and 100% oxygen breaths, camera visual done by [place]. Will have phlebotomy to draw am labs now

## 2019-09-05 NOTE — Care Management (Addendum)
CM informed by [facility] that insurance Josem Kaufmann has not yet been verified.  TOC will continue to follow   Update:  Auth has been obtained for [facility] - however LTACH auth expires on 12/23.  [facility] informed CM that pt can not be accepted due to fever. CM updated wife

## 2019-09-05 NOTE — Progress Notes (Signed)
Physical Therapy Wound Treatment Patient Details  Name: [name] MRN: [id number] Date of Birth: 02-15-1951  Today's Date: 09/05/2019 Time: 1000-1106 Time Calculation (min): 66 min  Subjective  Subjective: Eyes closed throughout session. Occasionally restless but no indications that pt was in significant pain.  Patient and Family Stated Goals: None stated, no family present.   Pain Score:  Pt restless at times but overall tolerated treatment well.   Wound Assessment  Pressure Injury 08/10/19 Sacrum Mid Deep Tissue Injury - Purple or maroon localized area of discolored intact skin or blood-filled blister due to damage of underlying soft tissue from pressure and/or shear. (Active)  Wound Image   09/05/19 1132  Dressing Type Negative pressure wound therapy 09/05/19 1132  Dressing Clean;Dry;Intact;Changed 09/05/19 1132  Dressing Change Frequency Monday, Wednesday, Friday 09/05/19 1132  State of Healing Non-healing 09/05/19 1132  Site / Wound Assessment Pink;Yellow 09/05/19 1132  % Wound base Red or Granulating 10% 09/05/19 1132  % Wound base Yellow/Fibrinous Exudate 90% 09/05/19 1132  % Wound base Black/Eschar 0% 09/05/19 1132  % Wound base Other/Granulation Tissue (Comment) 0% 09/05/19 1132  Peri-wound Assessment Intact;Maceration 09/05/19 1132  Wound Length (cm) 5.6 cm 08/30/19 1341  Wound Width (cm) 7 cm 08/30/19 1341  Wound Depth (cm) 0.5 cm 08/30/19 1341  Wound Surface Area (cm^2) 39.2 cm^2 08/30/19 1341  Wound Volume (cm^3) 19.6 cm^3 08/30/19 1341  Margins Unattached edges (unapproximated) 09/05/19 1132  Drainage Amount Minimal 09/05/19 1132  Drainage Description Serosanguineous 09/05/19 1132  Treatment Debridement (Selective);Negative pressure wound therapy 09/05/19 1132     Negative Pressure Wound Therapy Sacrum Mid (Active)  Last dressing change 09/05/19 09/05/19 1132  Site / Wound Assessment Dressing in place / Unable to assess 09/05/19 0800  Peri-wound  Assessment Intact 09/05/19 1132  Wound filler - Black foam 2 09/05/19 1132  Cycle On;Continuous 09/05/19 1132  Target Pressure (mmHg) 125 09/05/19 1132  Instillation Volume 22 mL 09/05/19 1132  Instillation Solution Normal Saline 09/05/19 1132  Instillation Soak Time 10 minutes 09/05/19 1132  Instillation Therapy Time 3.5 hours 09/05/19 1132  Canister Changed Yes 09/05/19 1132  Dressing Status Intact 09/05/19 1132  Drainage Amount Minimal 09/05/19 0800  Drainage Description Serosanguineous 09/05/19 1132  Output (mL) 275 mL 09/05/19 1132   Selective Debridement Selective Debridement - Location: Sacrum Selective Debridement - Tools Used: Forceps;Scissors Selective Debridement - Tissue Removed: yellow necrotic tissue   Wound Assessment and Plan  Wound Therapy - Assess/Plan/Recommendations Wound Therapy - Clinical Statement: Wound bed appears to be improving and unviable tissue was softer and more easily removed during debridement this session. Hydrotherapy will continue to follow, changing the VAC and debriding 3x/week, typically MWF. Wound Therapy - Functional Problem List: Global weakness in the setting of critical illness.  Factors Delaying/Impairing Wound Healing: Immobility;Multiple medical problems Hydrotherapy Plan: Debridement;Dressing change;Patient/family education;Pulsatile lavage with suction Wound Therapy - Frequency: 3X / week Wound Therapy - Follow Up Recommendations: Skilled nursing facility(vs LTACH) Wound Plan: See above  Wound Therapy Goals- Improve the function of patient's integumentary system by progressing the wound(s) through the phases of wound healing (inflammation - proliferation - remodeling) by: Decrease Necrotic Tissue to: 0 Decrease Necrotic Tissue - Progress: Progressing toward goal Increase Granulation Tissue to: 100 Increase Granulation Tissue - Progress: Progressing toward goal Goals/treatment plan/discharge plan were made with and agreed upon by  patient/family: No, Patient unable to participate in goals/treatment/discharge plan and family unavailable Time For Goal Achievement: 7 days Wound Therapy - Potential for Goals: Good  Goals will be updated until maximal potential achieved or discharge criteria met.  Discharge criteria: when goals achieved, discharge from hospital, MD decision/surgical intervention, no progress towards goals, refusal/missing three consecutive treatments without notification or medical reason.  GP     Thelma Comp 09/05/2019, 12:58 PM  Rolinda Roan, PT, DPT Acute Rehabilitation Services Pager: [phone] Office: [phone]

## 2019-09-05 NOTE — Progress Notes (Addendum)
Cortrak Tube Team Note:  Consult received to advance a Cortrak feeding tube.   RD advanced existing tube to 85 cm in the L nare. Secured with existing  bridle.   X-ray obtained for confirmation, abdominal x-ray has been ordered by the Cortrak team. Please confirm tube placement before using the Cortrak tube.   If the tube becomes dislodged please keep the tube and contact the Cortrak team at [email or web address] ([email or web address]) for replacement.  If after hours and replacement cannot be delayed, place a NG tube and confirm placement with an abdominal x-ray.    Mariana Single RD, LDN Clinical Nutrition Pager # [phone]
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
## 2019-09-05 NOTE — Progress Notes (Signed)
Transported pt to CT and back with RN without any complications 

## 2019-09-05 NOTE — Progress Notes (Addendum)
Patient had episode of brown emesis after being turned to side. Had some secretions from mouth and trach. Had similar episode overnight during bath. Trickle tube feeds. MD updated. Spoke to registered dietician and recommended advancing cortrack post-pyloric. MD updated and was okay with cortack advancing tube. Will continue to monitor.

## 2019-09-06 LAB — BASIC METABOLIC PANEL
Anion gap: 10 (ref 5–15)
BUN: 49 mg/dL — ABNORMAL HIGH (ref 8–23)
CO2: 26 mmol/L (ref 22–32)
Calcium: 9.9 mg/dL (ref 8.9–10.3)
Chloride: 114 mmol/L — ABNORMAL HIGH (ref 98–111)
Creatinine, Ser: 0.76 mg/dL (ref 0.61–1.24)
GFR calc Af Amer: >60 mL/min (ref >60)
GFR calc non Af Amer: >60 mL/min (ref >60)
Glucose, Bld: 151 mg/dL — ABNORMAL HIGH (ref 70–99)
Potassium: 3.9 mmol/L (ref 3.5–5.1)
Sodium: 150 mmol/L — ABNORMAL HIGH (ref 135–145)

## 2019-09-06 LAB — GLUCOSE, CAPILLARY
Glucose-Capillary: 132 mg/dL — ABNORMAL HIGH (ref 70–99)
Glucose-Capillary: 136 mg/dL — ABNORMAL HIGH (ref 70–99)
Glucose-Capillary: 177 mg/dL — ABNORMAL HIGH (ref 70–99)
Glucose-Capillary: 160 mg/dL — ABNORMAL HIGH (ref 70–99)
Glucose-Capillary: 143 mg/dL — ABNORMAL HIGH (ref 70–99)

## 2019-09-06 LAB — PROCALCITONIN: Procalcitonin: 0.11 ng/mL

## 2019-09-06 NOTE — Progress Notes (Signed)
Patient placed on ATC 60% tolerating well at this time.Patient suctioned before and after, and IC was changed. Vitals are stable. RN made aware. RT will continue to monitor.

## 2019-09-06 NOTE — Progress Notes (Signed)
Nutrition Follow-up  DOCUMENTATION CODES:   Obesity unspecified  INTERVENTION:   Continue TF via Cortrak tube:  Vital 1.5 @ 65 ml/hr (1560 ml per day)  Pro-stat 60 ml BID  Free water 200 ml QID  Tube feeding regimen and current free water provides 2740 kcal, 165 grams protein, and 1992 ml free water daily.   Continue Juven BID via tube, each packet provides 80 calories, 8 grams of carbohydrate, 2.5grams of protein (collagen), 7 grams of L-arginine and 7 grams of L-glutamine; supplement contains CaHMB, Vitamins C, E, B12 and Zinc to promote wound healing  NUTRITION DIAGNOSIS:   Increased nutrient needs related to acute illness (COVID-19 positive) as evidenced by estimated needs.  Ongoing  GOAL:   Provide needs based on ASPEN/SCCM guidelines  Met via TF  MONITOR:   Vent status, Labs, Weight trends, TF tolerance, Skin  ASSESSMENT:   68 y.o. male with medical history of DM, HTN, PVD s/p right BKA, BPH, chronic pain syndrome, COPD, and hyperlipidemia. He presented to the ED on 11/19 due to SOB, cough, wheezing, and confusion. He was diagnosed with COVID-19 on 11/15. He was 71% on RA when EMS arrived and he was placed on NRB. In the ED he had a temperature of 102.1. CXR showed multifocal PNA and pattern consistent with COVID-19.  11/25 - Cortrak tube placed, tip in stomach 12/04 - s/p trach by ENT 12/21 - Cortrak tube advanced to postpyloric position 12/22 - TC trial x 24 hours  Patient tolerated trach collar until midnight yesterday. Currently on trach collar.  TF infusing via Cortrak. Spoke with RN who reports pt is tolerating TF without issue this morning since Cortrak was advanced yesterday.  Current TF regimen: Vital 1.5 @ 65 ml/hr, free water 300 ml q 4 hours, Pro-stat 60 ml BID  Medications reviewed and include folic acid, novolog, lasix, levemir, tradjenta, liquid MVI, psyllium, juven, thiamine, vitamin B-12  Labs reviewed. Sodium 150 (H) CBG's:  [phone]  Weight trending up  I/O + 21 L since admission  Diet Order:   Diet Order            Diet NPO time specified  Diet effective now              EDUCATION NEEDS:   No education needs have been identified at this time  Skin:  Skin Assessment: Skin Integrity Issues: Skin Integrity Issues: DTI: sacrum Stage II: penis Incisions: neck  Last BM:  12/22 type 7  Height:   Ht Readings from Last 1 Encounters:  08/05/19 '6\' 1"'  (1.854 m)    Weight:   Wt Readings from Last 1 Encounters:  09/06/19 120.2 kg    Ideal Body Weight:  78.2 kg (adjusted for BKA)  BMI:  Body mass index is 34.96 kg/m.  Estimated Nutritional Needs:   Kcal:  5041-3643  Protein:  >/= 167 grams  Fluid:  >/= 2.6 L    Molli Barrows, RD, LDN, [place] Pager [phone] After Hours Pager [phone]

## 2019-09-06 NOTE — Progress Notes (Signed)
Assisted tele visit to patient with wife.  James Rowan, RN
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
## 2019-09-07 ENCOUNTER — Inpatient Hospital Stay (HOSPITAL_COMMUNITY): Payer: Medicare Other

## 2019-09-07 LAB — BASIC METABOLIC PANEL
Anion gap: 8 (ref 5–15)
BUN: 50 mg/dL — ABNORMAL HIGH (ref 8–23)
CO2: 27 mmol/L (ref 22–32)
Calcium: 9.9 mg/dL (ref 8.9–10.3)
Chloride: 115 mmol/L — ABNORMAL HIGH (ref 98–111)
Creatinine, Ser: 0.75 mg/dL (ref 0.61–1.24)
GFR calc Af Amer: >60 mL/min (ref >60)
GFR calc non Af Amer: >60 mL/min (ref >60)
Glucose, Bld: 181 mg/dL — ABNORMAL HIGH (ref 70–99)
Potassium: 3.6 mmol/L (ref 3.5–5.1)
Sodium: 150 mmol/L — ABNORMAL HIGH (ref 135–145)

## 2019-09-07 LAB — GLUCOSE, CAPILLARY
Glucose-Capillary: 112 mg/dL — ABNORMAL HIGH (ref 70–99)
Glucose-Capillary: 141 mg/dL — ABNORMAL HIGH (ref 70–99)
Glucose-Capillary: 192 mg/dL — ABNORMAL HIGH (ref 70–99)
Glucose-Capillary: 203 mg/dL — ABNORMAL HIGH (ref 70–99)
Glucose-Capillary: 226 mg/dL — ABNORMAL HIGH (ref 70–99)

## 2019-09-07 LAB — CBC
HCT: 26.1 % — ABNORMAL LOW (ref 39.0–52.0)
Hemoglobin: 7.7 g/dL — ABNORMAL LOW (ref 13.0–17.0)
MCH: 34.1 pg — ABNORMAL HIGH (ref 26.0–34.0)
MCHC: 29.5 g/dL — ABNORMAL LOW (ref 30.0–36.0)
MCV: 115.5 fL — ABNORMAL HIGH (ref 80.0–100.0)
Platelets: 350 10*3/uL (ref 150–400)
RBC: 2.26 MIL/uL — ABNORMAL LOW (ref 4.22–5.81)
RDW: 23.5 % — ABNORMAL HIGH (ref 11.5–15.5)
WBC: 8.2 10*3/uL (ref 4.0–10.5)
nRBC: 0.2 % (ref 0.0–0.2)

## 2019-09-07 MED ORDER — POTASSIUM CHLORIDE 20 MEQ/15ML (10%) PO SOLN
40.0000 meq | Freq: Once | ORAL | Status: AC
  Administered 2019-09-07: 40 meq via ORAL
  Filled 2019-09-07: qty 30

## 2019-09-07 MED ORDER — FREE WATER
400.0000 mL | Status: DC
  Administered 2019-09-07 – 2019-09-08 (×9): 400 mL
  Administered 2019-09-09: 250 mL
  Administered 2019-09-09 (×2): 400 mL
  Administered 2019-09-09: 200 mL
  Administered 2019-09-10: 400 mL
  Administered 2019-09-10: 200 mL
  Administered 2019-09-10 – 2019-09-18 (×47): 400 mL

## 2019-09-07 MED ORDER — DEXTROSE 5 % IV SOLN: INTRAVENOUS | Status: DC
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
## 2019-09-07 NOTE — Care Management (Signed)
CM informed by [facility] that pt has to be both fever free and Antipyretic free for 24 hours prior to admit to facility.  [facility] to request auth extension.    Attending made aware

## 2019-09-07 NOTE — Progress Notes (Signed)
Physical Therapy Wound Treatment Patient Details  Name: [name] MRN: [id number] Date of Birth: 1951-05-23  Today's Date: 09/07/2019 Time: 3335-4562 Time Calculation (min): 42 min  Subjective  Subjective: Alert and restless throughout session. Not responding with head nods to questions today.  Patient and Family Stated Goals: None stated, no family present.   Pain Score:  Pt restless and appears uncomfortable throughout session. RN present to administer pain medication during treatment.  Wound Assessment  Pressure Injury 08/10/19 Sacrum Mid Unstageable - Full thickness tissue loss in which the base of the injury is covered by slough (yellow, tan, gray, green or brown) and/or eschar (tan, brown or black) in the wound bed. wound has evolved into unstageable (Active)  Wound Image   09/07/19 1400  Dressing Type Negative pressure wound therapy 09/07/19 1400  Dressing Clean;Dry;Intact;Changed 09/07/19 1400  Dressing Change Frequency Monday, Wednesday, Friday 09/07/19 1400  State of Healing Non-healing 09/07/19 1400  Site / Wound Assessment Dressing in place / Unable to assess 09/07/19 1400  % Wound base Red or Granulating 10% 09/07/19 1400  % Wound base Yellow/Fibrinous Exudate 90% 09/07/19 1400  % Wound base Black/Eschar 0% 09/07/19 1400  % Wound base Other/Granulation Tissue (Comment) 0% 09/07/19 1400  Peri-wound Assessment Intact;Maceration 09/07/19 1400  Wound Length (cm) 5.6 cm 08/30/19 1341  Wound Width (cm) 7 cm 08/30/19 1341  Wound Depth (cm) 0.5 cm 08/30/19 1341  Wound Surface Area (cm^2) 39.2 cm^2 08/30/19 1341  Wound Volume (cm^3) 19.6 cm^3 08/30/19 1341  Margins Unattached edges (unapproximated) 09/07/19 1400  Drainage Amount Minimal 09/07/19 1400  Drainage Description Serosanguineous 09/07/19 1400  Treatment Debridement (Selective);Negative pressure wound therapy 09/07/19 1400     Negative Pressure Wound Therapy Sacrum Mid (Active)  Last dressing change 09/07/19  09/07/19 1400  Site / Wound Assessment Dressing in place / Unable to assess 09/07/19 1400  Peri-wound Assessment Intact 09/07/19 1400  Wound filler - Black foam 2 09/07/19 1400  Cycle On;Continuous 09/07/19 1400  Target Pressure (mmHg) 125 09/07/19 1400  Instillation Volume 18 mL 09/07/19 1400  Instillation Solution Normal Saline 09/07/19 1400  Instillation Soak Time 10 minutes 09/07/19 1400  Instillation Therapy Time 3.5 hours 09/07/19 1400  Canister Changed Yes 09/07/19 1400  Dressing Status Intact 09/07/19 1400  Drainage Amount Minimal 09/07/19 1400  Drainage Description Serosanguineous 09/07/19 1400  Output (mL) 175 mL 09/07/19 1400      Hydrotherapy Pulsed lavage therapy - wound location: Sacrum Pulsed Lavage with Suction (psi): 12 psi Pulsed Lavage with Suction - Normal Saline Used: 1000 mL Pulsed Lavage Tip: Tip with splash shield Selective Debridement Selective Debridement - Location: Sacrum Selective Debridement - Tools Used: Forceps;Scissors Selective Debridement - Tissue Removed: yellow necrotic tissue   Wound Assessment and Plan  Wound Therapy - Assess/Plan/Recommendations Wound Therapy - Clinical Statement: Wound bed appears to be improving and unviable tissue was softer and more easily removed during debridement this session. Macerated area inferior to wound appears improved today as well. Hydrotherapy will continue to follow, changing the VAC and debriding 3x/week, typically MWF. Wound Therapy - Functional Problem List: Global weakness in the setting of critical illness.  Factors Delaying/Impairing Wound Healing: Immobility;Multiple medical problems Hydrotherapy Plan: Debridement;Dressing change;Patient/family education;Pulsatile lavage with suction Wound Therapy - Frequency: 3X / week Wound Therapy - Follow Up Recommendations: Skilled nursing facility(vs LTACH) Wound Plan: See above  Wound Therapy Goals- Improve the function of patient's integumentary system by  progressing the wound(s) through the phases of wound healing (inflammation -  proliferation - remodeling) by: Decrease Necrotic Tissue to: 0 Decrease Necrotic Tissue - Progress: Progressing toward goal Increase Granulation Tissue to: 100 Increase Granulation Tissue - Progress: Progressing toward goal Goals/treatment plan/discharge plan were made with and agreed upon by patient/family: No, Patient unable to participate in goals/treatment/discharge plan and family unavailable Time For Goal Achievement: 7 days Wound Therapy - Potential for Goals: Good  Goals will be updated until maximal potential achieved or discharge criteria met.  Discharge criteria: when goals achieved, discharge from hospital, MD decision/surgical intervention, no progress towards goals, refusal/missing three consecutive treatments without notification or medical reason.  GP     Thelma Comp 09/07/2019, 2:46 PM   Rolinda Roan, PT, DPT Acute Rehabilitation Services Pager: [phone] Office: [phone]

## 2019-09-07 NOTE — Consult Note (Signed)
[place] Nurse wound follow up Reviewed photos and progress notes in the EMR. PT is performing Vericleanse Vac dressings to assist with removal of nonviable tissue.  Wound type: unstageable PI to coccyx/sacrum  Refer to PT measurements and percentages of slough WOC team will reassess weekly to determine if a change in the plan of care is indicated at that time. Julien Girt MSN, RN, [place], [place], [place]

## 2019-09-08 ENCOUNTER — Inpatient Hospital Stay (HOSPITAL_COMMUNITY): Payer: Medicare Other

## 2019-09-08 LAB — CBC
HCT: 27.5 % — ABNORMAL LOW (ref 39.0–52.0)
Hemoglobin: 8 g/dL — ABNORMAL LOW (ref 13.0–17.0)
MCH: 34.2 pg — ABNORMAL HIGH (ref 26.0–34.0)
MCHC: 29.1 g/dL — ABNORMAL LOW (ref 30.0–36.0)
MCV: 117.5 fL — ABNORMAL HIGH (ref 80.0–100.0)
Platelets: 366 10*3/uL (ref 150–400)
RBC: 2.34 MIL/uL — ABNORMAL LOW (ref 4.22–5.81)
RDW: 23.6 % — ABNORMAL HIGH (ref 11.5–15.5)
WBC: 8.7 10*3/uL (ref 4.0–10.5)
nRBC: 0.3 % — ABNORMAL HIGH (ref 0.0–0.2)

## 2019-09-08 LAB — BASIC METABOLIC PANEL
Anion gap: 5 (ref 5–15)
BUN: 47 mg/dL — ABNORMAL HIGH (ref 8–23)
CO2: 28 mmol/L (ref 22–32)
Calcium: 9.8 mg/dL (ref 8.9–10.3)
Chloride: 115 mmol/L — ABNORMAL HIGH (ref 98–111)
Creatinine, Ser: 0.64 mg/dL (ref 0.61–1.24)
GFR calc Af Amer: >60 mL/min (ref >60)
GFR calc non Af Amer: >60 mL/min (ref >60)
Glucose, Bld: 195 mg/dL — ABNORMAL HIGH (ref 70–99)
Potassium: 4.9 mmol/L (ref 3.5–5.1)
Sodium: 148 mmol/L — ABNORMAL HIGH (ref 135–145)

## 2019-09-08 LAB — BLOOD GAS, ARTERIAL
Acid-Base Excess: 3.6 mmol/L — ABNORMAL HIGH (ref 0.0–2.0)
Bicarbonate: 28.9 mmol/L — ABNORMAL HIGH (ref 20.0–28.0)
FIO2: 100
O2 Saturation: 83.2 %
Patient temperature: 38.7
pCO2 arterial: 58.8 mmHg — ABNORMAL HIGH (ref 32.0–48.0)
pH, Arterial: 7.322 — ABNORMAL LOW (ref 7.350–7.450)
pO2, Arterial: 57.1 mmHg — ABNORMAL LOW (ref 83.0–108.0)

## 2019-09-08 LAB — PROCALCITONIN: Procalcitonin: 0.27 ng/mL

## 2019-09-08 LAB — GLUCOSE, CAPILLARY
Glucose-Capillary: 167 mg/dL — ABNORMAL HIGH (ref 70–99)
Glucose-Capillary: 251 mg/dL — ABNORMAL HIGH (ref 70–99)
Glucose-Capillary: 178 mg/dL — ABNORMAL HIGH (ref 70–99)
Glucose-Capillary: 113 mg/dL — ABNORMAL HIGH (ref 70–99)
Glucose-Capillary: 41 mg/dL — CL (ref 70–99)
Glucose-Capillary: 110 mg/dL — ABNORMAL HIGH (ref 70–99)

## 2019-09-08 LAB — MRSA PCR SCREENING: MRSA by PCR: NEGATIVE

## 2019-09-08 LAB — PHOSPHORUS: Phosphorus: 3.5 mg/dL (ref 2.5–4.6)

## 2019-09-08 LAB — MAGNESIUM: Magnesium: 2.6 mg/dL — ABNORMAL HIGH (ref 1.7–2.4)

## 2019-09-08 MED ORDER — VANCOMYCIN HCL 2000 MG/400ML IV SOLN
2000.0000 mg | Freq: Once | INTRAVENOUS | Status: AC
  Administered 2019-09-08: 2000 mg via INTRAVENOUS
  Filled 2019-09-08: qty 400

## 2019-09-08 MED ORDER — PIPERACILLIN-TAZOBACTAM 3.375 G IVPB
3.3750 g | Freq: Three times a day (TID) | INTRAVENOUS | Status: DC
  Administered 2019-09-08 – 2019-09-13 (×14): 3.375 g via INTRAVENOUS
  Filled 2019-09-08 (×18): qty 50

## 2019-09-08 MED ORDER — DEXTROSE 10 % IV SOLN: INTRAVENOUS | Status: DC

## 2019-09-08 MED ORDER — NALOXONE HCL 0.4 MG/ML IJ SOLN
INTRAMUSCULAR | Status: AC
  Filled 2019-09-08: qty 1

## 2019-09-08 MED ORDER — VANCOMYCIN HCL 1250 MG/250ML IV SOLN
1250.0000 mg | Freq: Two times a day (BID) | INTRAVENOUS | Status: DC
  Administered 2019-09-09 – 2019-09-11 (×5): 1250 mg via INTRAVENOUS
  Filled 2019-09-08 (×7): qty 250

## 2019-09-08 MED ORDER — METOLAZONE 5 MG PO TABS
10.0000 mg | ORAL_TABLET | Freq: Once | ORAL | Status: AC
  Administered 2019-09-08: 10 mg via ORAL
  Filled 2019-09-08: qty 2

## 2019-09-08 MED ORDER — DEXTROSE 50 % IV SOLN
25.0000 g | INTRAVENOUS | Status: AC
  Administered 2019-09-08: 25 g via INTRAVENOUS

## 2019-09-08 MED ORDER — FUROSEMIDE 10 MG/ML IJ SOLN
40.0000 mg | Freq: Four times a day (QID) | INTRAMUSCULAR | Status: AC
  Administered 2019-09-08 (×3): 40 mg via INTRAVENOUS
  Filled 2019-09-08 (×3): qty 4
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
## 2019-09-08 NOTE — Care Management (Signed)
CM informed by [facility] that pt can not be accepted today due to recent fever.

## 2019-09-08 NOTE — Progress Notes (Signed)
eLink Physician-Brief Progress Note Patient Name: XAVEON SHILTZ III DOB: 05/25/1951 MRN: [id number]   Date of Service  09/08/2019  HPI/Events of Note  Hypoglycemia to 39. D50 already given.  eICU Interventions  Restart D10 drip at rate of 40cc/hr. Will titrate rate down if able / blood sugar remains in normal range.     Intervention Category Minor Interventions: Other:  Charlott Rakes 09/08/2019, 8:13 PM

## 2019-09-08 NOTE — Progress Notes (Signed)
SLP Cancellation Note  Patient Details Name: [name] MRN: [id number] DOB: [birth year]   Cancelled treatment:         Experienced respiratory distress this morning and transferred to ICU. Will follow up.    Houston Siren 09/08/2019, 11:21 AM   [name] Caroli.Ed Risk analyst [phone] Office [phone]

## 2019-09-08 NOTE — Progress Notes (Signed)
Pharmacy Antibiotic Note  [name] is a 68 y.o. male admitted on 07/18/2019, back in ICU from floor with respiratory distress and concern for pna.  Pharmacy has been consulted for vancomycin and zosyn dosing.  Plan: Vancomycin 2000mg  Iv x1, then 1250 mg Iv every 12 hours Goal AUC 400-550. Expected AUC: 470 SCr used: 0.8 Zosyn 3.375g IV every 8 hours (extended infusion) Monitor renal function, clinical progression and LOT  Height: 6\' 1"  (185.4 cm) Weight: 261 lb 3.9 oz (118.5 kg) IBW/kg (Calculated) : 79.9  Temp (24hrs), Avg:99.9 F (37.7 C), Min:98.3 F (36.8 C), Max:101.6 F (38.7 C)  Recent Labs  Lab 09/02/19 0305 09/05/19 0304 09/06/19 0337 09/07/19 0338 09/08/19 0251  WBC 6.1 8.6  --  8.2 8.7  CREATININE 0.63 0.76 0.76 0.75 0.64    Estimated Creatinine Clearance: 122.4 mL/min (by C-G formula based on SCr of 0.64 mg/dL).    Allergies  Allergen Reactions  . Bactrim [Sulfamethoxazole-Trimethoprim] Other (See Comments)    Blisters in his mouth  . Budesonide-Formoterol Fumarate Other (See Comments)    Hyperglycemia  Hyperglycemia   . Advair Diskus [Fluticasone-Salmeterol] Other (See Comments)    hyperglycemia  . Hydromorphone Hives, Itching and Nausea And Vomiting  . Pulmicort [Budesonide] Other (See Comments)    Hyperglycemia   . Morphine Itching  . Soap Itching and Other (See Comments)    Other Reaction: ivory soap=itching     Antimicrobials this admission: 11/19 Remdesivir >> 11/23 11/19 Ceftriaxone >> 11/23 11/19 Azithromycin >> 11/23 11/20 Vancomycin >>11/22 11/26 Zosyn >> 11/28 11/26 Levaquin >> 11/28 11/28 Aztreonam>>12/12 11/28 Avycaz>>  12/12  11/11 Covid + 11/19 UCx: insignificant growth 11/19 BCx: 1/3 Coag neg staph 11/21 MRSA PCR: neg 11/20 TA >> Few pseudomonas (R to cipro & imi)/ few stenotrophomonas (pan-S) 11/25 TA >> mod serratia, mod pseudo, mod steno (pending - drawn before starting abx)    Bertis Ruddy,  PharmD Clinical Pharmacist Please check AMION for all [place] numbers 09/08/2019 12:17 PM

## 2019-09-08 NOTE — Progress Notes (Signed)
PT Cancellation Note  Patient Details Name: [name] MRN: [id number] DOB: 1950-12-05   Cancelled Treatment:    Reason Eval/Treat Not Completed: Medical issues which prohibited therapy  Patient transferred back to ICU and on ventilator. Discussed with RN and will defer PT/mobility evaluation at this time.    Arby Barrette, PT Pager [phone]   Rexanne Mano 09/08/2019, 12:53 PM

## 2019-09-08 NOTE — Progress Notes (Signed)
Approximately 0800 patient O2 sats 84% 10L trach collar. Deep suctioned performed. O2 sats briefly improved to 88%. [id number] notified RRT and RR re: patient declining respiratory status. Patient on 10L trach collar; O2 sats 84%; exhibiting Kussmaul breathing. Applied NRB. O2 sats briefly improved. Febrile 101.6. APAP given at 0900. CCM at bedside. Narcan 0.4 given at 0906. ABGs obtained. Decision made to transfer to ICU. Report given to [facility]. Successful transport to 42M-08. Daughter notified. Now at bedside.

## 2019-09-08 NOTE — Progress Notes (Signed)
Patient placed on full support due to breathing in high 30's.

## 2019-09-08 NOTE — Progress Notes (Signed)
Patient's BP leaning towards the lower side. Held 2000 BP meds. Last pressure 96/46 (61). Notified MD. Awaiting further orders.   Jackalyn Lombard

## 2019-09-08 NOTE — Progress Notes (Signed)
Hypoglycemic Event  CBG: 39  Treatment: D50 50 mL (25 gm)  Symptoms: None  Follow-up CBG: Time:2011 CBG Result:113  Possible Reasons for Event: Inadequate meal intake/ TF stopped earlier today, insulin still given.   Comments/MD notified:[place] MD    Jackalyn Lombard

## 2019-09-08 NOTE — Progress Notes (Signed)
OT Cancellation Note  Patient Details Name: [name] MRN: [id number] DOB: February 25, 1951   Cancelled Treatment:    Reason Eval/Treat Not Completed: Medical issues which prohibited therapy(worsening hypoxia, transferred to ICU).  Malka So 09/08/2019, 11:03 AM  Nestor Lewandowsky, OTR/L Acute Rehabilitation Services Pager: [phone] Office: [phone]

## 2019-09-08 NOTE — Significant Event (Signed)
Rapid Response Event Note  Overview: Respiratory Distress  Initial Focused Assessment: I received an urgent call from the nurse with concerns of patient having hypoxia and increased work of breathing. I was with another patient and arrived quickly to the bedside. Patient was dusky and guppy breathing in the 30s, oxygen was administered through to [facility] at 90% and 10L and via a NRB 15L - saturations were 85-88%. Patient was essentially not responsive, grimace to pain at times. HR/BP were stable, febrile though. Lung sounds - rhonchi throughout. TRH MD was paged and I contacted PCCM MD.  Interventions: -- STAT ABG -- STAT CXR -- Narcan 0.4 mg IV x 1   Plan of Care: -- Urgent transfer to ICU for mechanical ventilation   Event Summary:  Start Time 0813 Arrival Time 0820 End Time [place], [place]

## 2019-09-09 ENCOUNTER — Inpatient Hospital Stay (HOSPITAL_COMMUNITY): Payer: Medicare Other

## 2019-09-09 LAB — CBC
HCT: 22.7 % — ABNORMAL LOW (ref 39.0–52.0)
Hemoglobin: 6.8 g/dL — CL (ref 13.0–17.0)
MCH: 34.2 pg — ABNORMAL HIGH (ref 26.0–34.0)
MCHC: 30 g/dL (ref 30.0–36.0)
MCV: 114.1 fL — ABNORMAL HIGH (ref 80.0–100.0)
Platelets: 294 10*3/uL (ref 150–400)
RBC: 1.99 MIL/uL — ABNORMAL LOW (ref 4.22–5.81)
RDW: 22.7 % — ABNORMAL HIGH (ref 11.5–15.5)
WBC: 7.3 10*3/uL (ref 4.0–10.5)
nRBC: 0.5 % — ABNORMAL HIGH (ref 0.0–0.2)

## 2019-09-09 LAB — BASIC METABOLIC PANEL
Anion gap: 11 (ref 5–15)
BUN: 57 mg/dL — ABNORMAL HIGH (ref 8–23)
CO2: 30 mmol/L (ref 22–32)
Calcium: 9.8 mg/dL (ref 8.9–10.3)
Chloride: 105 mmol/L (ref 98–111)
Creatinine, Ser: 0.72 mg/dL (ref 0.61–1.24)
GFR calc Af Amer: >60 mL/min (ref >60)
GFR calc non Af Amer: >60 mL/min (ref >60)
Glucose, Bld: 100 mg/dL — ABNORMAL HIGH (ref 70–99)
Potassium: 2.8 mmol/L — ABNORMAL LOW (ref 3.5–5.1)
Sodium: 146 mmol/L — ABNORMAL HIGH (ref 135–145)

## 2019-09-09 LAB — GLUCOSE, CAPILLARY
Glucose-Capillary: 104 mg/dL — ABNORMAL HIGH (ref 70–99)
Glucose-Capillary: 85 mg/dL (ref 70–99)
Glucose-Capillary: 117 mg/dL — ABNORMAL HIGH (ref 70–99)
Glucose-Capillary: 140 mg/dL — ABNORMAL HIGH (ref 70–99)
Glucose-Capillary: 143 mg/dL — ABNORMAL HIGH (ref 70–99)

## 2019-09-09 LAB — HEMOGLOBIN AND HEMATOCRIT, BLOOD
HCT: 22.9 % — ABNORMAL LOW (ref 39.0–52.0)
HCT: 27 % — ABNORMAL LOW (ref 39.0–52.0)
Hemoglobin: 7.7 g/dL — ABNORMAL LOW (ref 13.0–17.0)
Hemoglobin: 8.6 g/dL — ABNORMAL LOW (ref 13.0–17.0)

## 2019-09-09 LAB — PREPARE RBC (CROSSMATCH)

## 2019-09-09 LAB — MAGNESIUM: Magnesium: 2 mg/dL (ref 1.7–2.4)

## 2019-09-09 LAB — PHOSPHORUS: Phosphorus: 2.9 mg/dL (ref 2.5–4.6)

## 2019-09-09 MED ORDER — POTASSIUM PHOSPHATES 15 MMOLE/5ML IV SOLN
30.0000 mmol | Freq: Once | INTRAVENOUS | Status: AC
  Administered 2019-09-09: 30 mmol via INTRAVENOUS
  Filled 2019-09-09: qty 10

## 2019-09-09 MED ORDER — ONDANSETRON HCL 4 MG/2ML IJ SOLN: 4.0000 mg | Freq: Four times a day (QID) | INTRAMUSCULAR | Status: DC | PRN

## 2019-09-09 MED ORDER — POTASSIUM CHLORIDE 10 MEQ/50ML IV SOLN
10.0000 meq | INTRAVENOUS | Status: DC
  Administered 2019-09-09: 10 meq via INTRAVENOUS
  Filled 2019-09-09 (×2): qty 50

## 2019-09-09 MED ORDER — VITAL 1.5 CAL PO LIQD
1000.0000 mL | ORAL | Status: DC
  Administered 2019-09-09 (×2): 1000 mL
  Filled 2019-09-09 (×2): qty 1000

## 2019-09-09 MED ORDER — FUROSEMIDE 10 MG/ML IJ SOLN
40.0000 mg | Freq: Four times a day (QID) | INTRAMUSCULAR | Status: AC
  Administered 2019-09-09 (×3): 40 mg via INTRAVENOUS
  Filled 2019-09-09 (×3): qty 4

## 2019-09-09 MED ORDER — PROMETHAZINE HCL 25 MG/ML IJ SOLN
12.5000 mg | Freq: Four times a day (QID) | INTRAMUSCULAR | Status: DC | PRN
  Administered 2019-09-09 – 2019-09-10 (×2): 12.5 mg via INTRAVENOUS
  Filled 2019-09-09 (×4): qty 1

## 2019-09-09 MED ORDER — SODIUM CHLORIDE 0.9% IV SOLUTION: Freq: Once | INTRAVENOUS | Status: AC

## 2019-09-09 MED ORDER — METOLAZONE 5 MG PO TABS
10.0000 mg | ORAL_TABLET | Freq: Once | ORAL | Status: AC
  Administered 2019-09-09: 10 mg via ORAL
  Filled 2019-09-09: qty 2
  Filled 2019-09-09 (×2): qty 1
  Filled 2019-09-09: qty 2

## 2019-09-09 MED ORDER — POTASSIUM CHLORIDE 20 MEQ/15ML (10%) PO SOLN
40.0000 meq | Freq: Three times a day (TID) | ORAL | Status: AC
  Administered 2019-09-09 (×2): 40 meq
  Filled 2019-09-09 (×2): qty 30

## 2019-09-09 MED ORDER — ONDANSETRON HCL 4 MG/2ML IJ SOLN
4.0000 mg | Freq: Four times a day (QID) | INTRAMUSCULAR | Status: DC
  Administered 2019-09-09: 4 mg via INTRAVENOUS
  Filled 2019-09-09: qty 2

## 2019-09-09 MED ORDER — POTASSIUM CHLORIDE 10 MEQ/100ML IV SOLN
10.0000 meq | Freq: Once | INTRAVENOUS | Status: AC
  Administered 2019-09-09: 10 meq via INTRAVENOUS
  Filled 2019-09-09: qty 100

## 2019-09-09 MED ORDER — DILTIAZEM 12 MG/ML ORAL SUSPENSION
60.0000 mg | Freq: Four times a day (QID) | ORAL | Status: DC
  Administered 2019-09-09 – 2019-09-15 (×25): 60 mg
  Filled 2019-09-09 (×27): qty 6

## 2019-09-09 NOTE — Progress Notes (Signed)
Assisted tele visit to patient with daughter.   [place]  Vear Clock, RN

## 2019-09-09 NOTE — Progress Notes (Signed)
eLink Physician-Brief Progress Note Patient Name: [name] DOB: 1951-01-04 MRN: [id number]   Date of Service  09/09/2019  HPI/Events of Note  Hb drop from 7.7 to 6.8 - serosanguinous from wound vac per RN Mild hypotension through the nigh, but on multiple anti-hypertensives  eICU Interventions  1 U PRBC Hold apixaban for now     Intervention Category Intermediate Interventions: Bleeding - evaluation and treatment with blood products  Chanya Chrisley V. Ladeidra Borys 09/09/2019, 4:38 AM

## 2019-09-09 NOTE — Progress Notes (Addendum)
Patient's wound vac would not seal after scheduled dressing change. New foam ordered for a second try and had a third person observed who is well-versed in wound vacs. He was not able to get it to seal in the patient's sacrum/rectal area. A wet-to-dry was placed and WOC has been notified. Bartholomew Crews, RN 09/09/2019 6:37 PM

## 2019-09-09 NOTE — Progress Notes (Signed)
Tube feeds off since 12/24 upon arrival at shift 1900. Notified MD to inquire for reasoning and to restart or not. No written documentation found for reasoning. Order "expired". MD says to just hold off on restarting to make sure there's not an undocumented reason for why it is stopped. Will relay message to day shift nurse.   Jackalyn Lombard
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
## 2019-09-09 NOTE — Progress Notes (Signed)
CRITICAL VALUE ALERT  Critical Value:  hgb 6.8  Date & Time Notied:  09/09/19 0434  Provider Notified: Elsworth Soho, MD  Orders Received/Actions taken: awaiting orders

## 2019-09-10 ENCOUNTER — Inpatient Hospital Stay (HOSPITAL_COMMUNITY): Payer: Medicare Other

## 2019-09-10 LAB — BASIC METABOLIC PANEL
Anion gap: 10 (ref 5–15)
BUN: 48 mg/dL — ABNORMAL HIGH (ref 8–23)
CO2: 35 mmol/L — ABNORMAL HIGH (ref 22–32)
Calcium: 9.5 mg/dL (ref 8.9–10.3)
Chloride: 100 mmol/L (ref 98–111)
Creatinine, Ser: 0.7 mg/dL (ref 0.61–1.24)
GFR calc Af Amer: >60 mL/min (ref >60)
GFR calc non Af Amer: >60 mL/min (ref >60)
Glucose, Bld: 129 mg/dL — ABNORMAL HIGH (ref 70–99)
Potassium: 3.1 mmol/L — ABNORMAL LOW (ref 3.5–5.1)
Sodium: 145 mmol/L (ref 135–145)

## 2019-09-10 LAB — TYPE AND SCREEN
ABO/RH(D): O NEG
Antibody Screen: NEGATIVE
Unit division: 0

## 2019-09-10 LAB — GLUCOSE, CAPILLARY
Glucose-Capillary: 107 mg/dL — ABNORMAL HIGH (ref 70–99)
Glucose-Capillary: 109 mg/dL — ABNORMAL HIGH (ref 70–99)
Glucose-Capillary: 136 mg/dL — ABNORMAL HIGH (ref 70–99)
Glucose-Capillary: 183 mg/dL — ABNORMAL HIGH (ref 70–99)
Glucose-Capillary: 190 mg/dL — ABNORMAL HIGH (ref 70–99)
Glucose-Capillary: 241 mg/dL — ABNORMAL HIGH (ref 70–99)
Glucose-Capillary: 80 mg/dL (ref 70–99)

## 2019-09-10 LAB — CBC
HCT: 26 % — ABNORMAL LOW (ref 39.0–52.0)
Hemoglobin: 8.3 g/dL — ABNORMAL LOW (ref 13.0–17.0)
MCH: 33.6 pg (ref 26.0–34.0)
MCHC: 31.9 g/dL (ref 30.0–36.0)
MCV: 105.3 fL — ABNORMAL HIGH (ref 80.0–100.0)
Platelets: 336 10*3/uL (ref 150–400)
RBC: 2.47 MIL/uL — ABNORMAL LOW (ref 4.22–5.81)
RDW: 23.2 % — ABNORMAL HIGH (ref 11.5–15.5)
WBC: 9.2 10*3/uL (ref 4.0–10.5)
nRBC: 0 % (ref 0.0–0.2)

## 2019-09-10 LAB — BPAM RBC
Blood Product Expiration Date: 202101052359
ISSUE DATE / TIME: 202012250621
Unit Type and Rh: 9500

## 2019-09-10 LAB — MAGNESIUM: Magnesium: 2 mg/dL (ref 1.7–2.4)

## 2019-09-10 LAB — PHOSPHORUS: Phosphorus: 3.1 mg/dL (ref 2.5–4.6)

## 2019-09-10 MED ORDER — POTASSIUM CHLORIDE 20 MEQ/15ML (10%) PO SOLN
40.0000 meq | ORAL | Status: AC
  Administered 2019-09-10 (×2): 40 meq via ORAL
  Filled 2019-09-10 (×2): qty 30

## 2019-09-10 MED ORDER — FENTANYL CITRATE (PF) 100 MCG/2ML IJ SOLN
INTRAMUSCULAR | Status: AC
  Filled 2019-09-10: qty 2

## 2019-09-10 MED ORDER — METOCLOPRAMIDE HCL 5 MG/ML IJ SOLN
10.0000 mg | Freq: Two times a day (BID) | INTRAMUSCULAR | Status: AC
  Administered 2019-09-10 – 2019-09-11 (×4): 10 mg via INTRAVENOUS
  Filled 2019-09-10 (×4): qty 2

## 2019-09-10 MED ORDER — FUROSEMIDE 10 MG/ML IJ SOLN
40.0000 mg | Freq: Four times a day (QID) | INTRAMUSCULAR | Status: AC
  Administered 2019-09-10 (×3): 40 mg via INTRAVENOUS
  Filled 2019-09-10 (×3): qty 4

## 2019-09-10 MED ORDER — VITAL 1.5 CAL PO LIQD
1000.0000 mL | ORAL | Status: DC
  Administered 2019-09-10 – 2019-09-13 (×4): 1000 mL
  Filled 2019-09-10 (×7): qty 1000

## 2019-09-10 MED ORDER — METOLAZONE 10 MG PO TABS
10.0000 mg | ORAL_TABLET | Freq: Once | ORAL | Status: DC
  Filled 2019-09-10 (×2): qty 1

## 2019-09-10 MED ORDER — FENTANYL CITRATE (PF) 100 MCG/2ML IJ SOLN
50.0000 ug | INTRAMUSCULAR | Status: DC | PRN
  Administered 2019-09-10: 12.5 ug via INTRAVENOUS
  Administered 2019-09-10 – 2019-09-11 (×3): 50 ug via INTRAVENOUS
  Filled 2019-09-10 (×3): qty 2
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
## 2019-09-10 NOTE — Consult Note (Signed)
[facility] Nurse Consult Note: Reason for Consult:  Responded to page regarding NPWT device with inability to maintain seal. Vericleanse dressings in place, but seal is inconsistent due to moisture in area. Instructed staff to use NS wet to dry dressings and to notify PT for another attempt at NPWT dressing later today since they manage the Vericleanse NPWT devices. If dressing is unable to be maintained, PT staff will need to continue the use of NS wet to dry dressings with hydrotherapy and sharp debridement. Bedside RN Saralyn Pilar is appreciated for his troubleshooting attempts and acknowledged the need to contact PT.  [place] nursing team will follow weekly, and will remain available to this patient, the nursing and medical teams.   Thanks, Maudie Flakes, MSN, RN, [place], Arther Abbott  Pager# [phone]

## 2019-09-10 NOTE — Progress Notes (Signed)
Physical Therapy Wound Treatment Patient Details  Name: [name] MRN: [id number] Date of Birth: March 17, 1951  Today's Date: 09/10/2019 Time: 3149-7026 Time Calculation (min): 40 min  Subjective  Subjective: Alert and restless throughout session. Nodded head yes when asked about pain during treatment which was the only intentional response noted throughout session.  Patient and Family Stated Goals: None stated, no family present.   Pain Score:  Pt appeared to be in significant pain during treatment and RN notified. IV pain meds administered during session and pt calmed some after.  Wound Assessment  Pressure Injury 08/10/19 Sacrum Mid Unstageable - Full thickness tissue loss in which the base of the injury is covered by slough (yellow, tan, gray, green or brown) and/or eschar (tan, brown or black) in the wound bed. wound has evolved into unstageable (Active)  Wound Image   09/10/19 1200  Dressing Type ABD;Barrier Film (skin prep);Gauze (Comment);Moist to dry 09/10/19 1200  Dressing Changed;Clean;Dry;Intact 09/10/19 1200  Dressing Change Frequency Daily 09/10/19 1200  State of Healing Non-healing 09/10/19 0415  Site / Wound Assessment Pink;Yellow;Black 09/10/19 1200  % Wound base Red or Granulating 10% 09/10/19 1200  % Wound base Yellow/Fibrinous Exudate 70% 09/10/19 1200  % Wound base Black/Eschar 20% 09/10/19 1200  % Wound base Other/Granulation Tissue (Comment) 0% 09/09/19 0800  Peri-wound Assessment Intact 09/09/19 1100  Wound Length (cm) 5 cm 09/07/19 1400  Wound Width (cm) 8 cm 09/07/19 1400  Wound Depth (cm) 0.1 cm 09/07/19 1400  Wound Surface Area (cm^2) 40 cm^2 09/07/19 1400  Wound Volume (cm^3) 4 cm^3 09/07/19 1400  Margins Unattached edges (unapproximated) 09/10/19 0415  Drainage Amount Minimal 09/10/19 1200  Drainage Description Serosanguineous 09/10/19 1200  Treatment Debridement (Selective);Hydrotherapy (Pulse lavage);Packing (Saline gauze) 09/10/19 1200       Hydrotherapy Pulsed lavage therapy - wound location: Sacrum Pulsed Lavage with Suction (psi): 8 psi Pulsed Lavage with Suction - Normal Saline Used: 1000 mL Pulsed Lavage Tip: Tip with splash shield Selective Debridement Selective Debridement - Location: Sacrum Selective Debridement - Tools Used: Forceps;Scissors Selective Debridement - Tissue Removed: yellow and black necrotic tissue   Wound Assessment and Plan  Wound Therapy - Assess/Plan/Recommendations Wound Therapy - Clinical Statement: Wound VAC removed by nursing 09/09/2019 due to leak in seal. Upon inspection of wound/peri-wound area today noted an area of maceration and peeling skin on L buttock. Opted for continued wet>dry dressings and we will assess wound area Monday 12/28 for appropriateness to resume VAC. Discussed with Saralyn Pilar, RN and asked about resuming Santyl until [facility] restarted as this was his wound plan prior.  Wound Therapy - Functional Problem List: Global weakness in the setting of critical illness.  Factors Delaying/Impairing Wound Healing: Immobility;Multiple medical problems Hydrotherapy Plan: Debridement;Dressing change;Patient/family education;Pulsatile lavage with suction Wound Therapy - Frequency: 3X / week Wound Therapy - Follow Up Recommendations: Skilled nursing facility(vs LTACH) Wound Plan: See above  Wound Therapy Goals- Improve the function of patient's integumentary system by progressing the wound(s) through the phases of wound healing (inflammation - proliferation - remodeling) by: Decrease Necrotic Tissue to: 0 Decrease Necrotic Tissue - Progress: Progressing toward goal Increase Granulation Tissue to: 100 Increase Granulation Tissue - Progress: Progressing toward goal Goals/treatment plan/discharge plan were made with and agreed upon by patient/family: No, Patient unable to participate in goals/treatment/discharge plan and family unavailable Time For Goal Achievement: 7 days Wound Therapy -  Potential for Goals: Good  Goals will be updated until maximal potential achieved or discharge criteria met.  Discharge  criteria: when goals achieved, discharge from hospital, MD decision/surgical intervention, no progress towards goals, refusal/missing three consecutive treatments without notification or medical reason.  GP     Thelma Comp 09/10/2019, 12:40 PM   Rolinda Roan, PT, DPT Acute Rehabilitation Services Pager: [phone] Office: [phone]
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
## 2019-09-10 NOTE — Progress Notes (Signed)
Nutrition Follow-up  DOCUMENTATION CODES:   Obesity unspecified  INTERVENTION:  Continue TF via Cortrak tube:  Vital 1.5 @ 65 ml/hr (1560 ml per day)  Pro-stat 60 ml BID  Free water 400 ml q 4 hours per MD orders  Tube feeding regimen and current free water provides 2740 kcal, 165 grams protein, and 3586 ml free water daily.   Continue Juven BIDvia tube, each packet provides 80 calories, 8 grams of carbohydrate, 2.5grams of protein (collagen), 7 grams of L-arginine and 7 grams of L-glutamine; supplement contains CaHMB, Vitamins C, E, B12 and Zinc to promote wound healing  NUTRITION DIAGNOSIS:   Increased nutrient needs related to acute illness(COVID-19 positive) as evidenced by estimated needs; ongoing  GOAL:   Provide needs based on ASPEN/SCCM guidelines; met with TF  MONITOR:   Vent status, Labs, Weight trends, TF tolerance, Skin  REASON FOR ASSESSMENT:   Ventilator    ASSESSMENT:   68 y.o. male with medical history of DM, HTN, PVD s/p right BKA, BPH, chronic pain syndrome, COPD, and hyperlipidemia. He presented to the ED on 11/19 due to SOB, cough, wheezing, and confusion. He was diagnosed with COVID-19 on 11/15. He was 71% on RA when EMS arrived and he was placed on NRB. In the ED he had a temperature of 102.1. CXR showed multifocal PNA and pattern consistent with COVID-19.  11/25 - Cortrak tube placed, tip in stomach 12/04 - s/p trach by ENT 12/21 - Cortrak tube advanced to postpyloric position 12/22 - TC trial x 24 hours  Pt continues on trach collar trials. Pt with post pyloric Cortrak NGT in place. MD consulted for assessment of tube feeding regimen. Per RN, pt complains of periodic abdominal pains, however no emesis. RD to continue current tube feeding regimen. Labs and medications reviewed.   Pt with wound VAC to sacrum, however unable to seal. Wet to dry dressing was placed instead.  Diet Order:   Diet Order            Diet NPO time specified  Diet  effective now              EDUCATION NEEDS:   No education needs have been identified at this time  Skin:  Skin Assessment: Skin Integrity Issues: Skin Integrity Issues:: Stage III, Unstageable, Wound VAC DTI: N/A Stage II: penis Stage III: N/A Unstageable: sacrum Wound Vac: sacrum Incisions: neck Other: skin tear to right arm  Last BM:  12/25- 300 ml rectal tube  Height:   Ht Readings from Last 1 Encounters:  09/08/19 _0  (1.854 m)    Weight:   Wt Readings from Last 1 Encounters:  09/10/19 116 kg    Ideal Body Weight:  78.2 kg(adjusted for BKA)  BMI:  Body mass index is 33.74 kg/m.  Estimated Nutritional Needs:   Kcal:  2992-4268  Protein:  >/= 167 grams  Fluid:  >/= 2.6 L    Corrin Parker, MS, RD, LDN Pager # [phone] After hours/ weekend pager # [phone]

## 2019-09-10 NOTE — Plan of Care (Signed)
Pt periodocially c/o abdominal/stomach pain or discomfort.  TF on/off during night to prevent emesis prior to turning .  No vomiting during shift.  FC remains in place for retention and decrease urine into sacrum wound, which currently is Wet to Dry dsg d/t wound vac not taking seal on area.  Will call team today again re: issue.  Pt also remains with flexiseal in place.  Passing much flatus but little stool overnight.  Problem: Nutrition: Goal: Adequate nutrition will be maintained Outcome: Not Progressing   Problem: Elimination: Goal: Will not experience complications related to bowel motility Outcome: Not Progressing Goal: Will not experience complications related to urinary retention Outcome: Not Progressing

## 2019-09-10 NOTE — Progress Notes (Signed)
Pt taken off ACT and placed back on documented full support vent setting to rest for the night. Pt tolerating well.

## 2019-09-11 ENCOUNTER — Inpatient Hospital Stay: Payer: Self-pay

## 2019-09-11 ENCOUNTER — Inpatient Hospital Stay (HOSPITAL_COMMUNITY): Payer: Medicare Other

## 2019-09-11 LAB — BASIC METABOLIC PANEL
Anion gap: 12 (ref 5–15)
BUN: 54 mg/dL — ABNORMAL HIGH (ref 8–23)
CO2: 36 mmol/L — ABNORMAL HIGH (ref 22–32)
Calcium: 10.2 mg/dL (ref 8.9–10.3)
Chloride: 96 mmol/L — ABNORMAL LOW (ref 98–111)
Creatinine, Ser: 0.7 mg/dL (ref 0.61–1.24)
GFR calc Af Amer: >60 mL/min (ref >60)
GFR calc non Af Amer: >60 mL/min (ref >60)
Glucose, Bld: 141 mg/dL — ABNORMAL HIGH (ref 70–99)
Potassium: 2.8 mmol/L — ABNORMAL LOW (ref 3.5–5.1)
Sodium: 144 mmol/L (ref 135–145)

## 2019-09-11 LAB — CBC
HCT: 28 % — ABNORMAL LOW (ref 39.0–52.0)
Hemoglobin: 8.9 g/dL — ABNORMAL LOW (ref 13.0–17.0)
MCH: 33.7 pg (ref 26.0–34.0)
MCHC: 31.8 g/dL (ref 30.0–36.0)
MCV: 106.1 fL — ABNORMAL HIGH (ref 80.0–100.0)
Platelets: 360 10*3/uL (ref 150–400)
RBC: 2.64 MIL/uL — ABNORMAL LOW (ref 4.22–5.81)
RDW: 22.7 % — ABNORMAL HIGH (ref 11.5–15.5)
WBC: 10.2 10*3/uL (ref 4.0–10.5)
nRBC: 0.2 % (ref 0.0–0.2)

## 2019-09-11 LAB — GLUCOSE, CAPILLARY
Glucose-Capillary: 101 mg/dL — ABNORMAL HIGH (ref 70–99)
Glucose-Capillary: 141 mg/dL — ABNORMAL HIGH (ref 70–99)
Glucose-Capillary: 210 mg/dL — ABNORMAL HIGH (ref 70–99)
Glucose-Capillary: 214 mg/dL — ABNORMAL HIGH (ref 70–99)
Glucose-Capillary: 214 mg/dL — ABNORMAL HIGH (ref 70–99)
Glucose-Capillary: 90 mg/dL (ref 70–99)

## 2019-09-11 LAB — MAGNESIUM: Magnesium: 2.1 mg/dL (ref 1.7–2.4)

## 2019-09-11 LAB — PHOSPHORUS: Phosphorus: 2.2 mg/dL — ABNORMAL LOW (ref 2.5–4.6)

## 2019-09-11 MED ORDER — APIXABAN 5 MG PO TABS
5.0000 mg | ORAL_TABLET | Freq: Two times a day (BID) | ORAL | Status: DC
  Administered 2019-09-11 – 2019-09-18 (×15): 5 mg
  Filled 2019-09-11 (×15): qty 1

## 2019-09-11 MED ORDER — FUROSEMIDE 10 MG/ML IJ SOLN
40.0000 mg | Freq: Three times a day (TID) | INTRAMUSCULAR | Status: AC
  Administered 2019-09-11 (×2): 40 mg via INTRAVENOUS
  Filled 2019-09-11 (×2): qty 4

## 2019-09-11 MED ORDER — POTASSIUM CHLORIDE 10 MEQ/50ML IV SOLN
10.0000 meq | INTRAVENOUS | Status: AC
  Administered 2019-09-11 (×8): 10 meq via INTRAVENOUS
  Filled 2019-09-11 (×8): qty 50

## 2019-09-11 MED ORDER — SODIUM CHLORIDE 0.9% FLUSH
10.0000 mL | Freq: Two times a day (BID) | INTRAVENOUS | Status: DC
  Administered 2019-09-11: 10:00:00 10 mL
  Administered 2019-09-13: 20 mL
  Administered 2019-09-13 – 2019-09-15 (×5): 10 mL
  Administered 2019-09-16: 30 mL
  Administered 2019-09-16: 10 mL
  Administered 2019-09-17: 20 mL
  Administered 2019-09-17 – 2019-09-18 (×3): 10 mL

## 2019-09-11 MED ORDER — SODIUM CHLORIDE 0.9% FLUSH
10.0000 mL | INTRAVENOUS | Status: DC | PRN
  Administered 2019-09-18: 10 mL

## 2019-09-11 MED ORDER — COLLAGENASE 250 UNIT/GM EX OINT
TOPICAL_OINTMENT | Freq: Every day | CUTANEOUS | Status: DC
  Administered 2019-09-11: 1 via TOPICAL
  Filled 2019-09-11 (×2): qty 30

## 2019-09-11 MED ORDER — POTASSIUM CHLORIDE 20 MEQ/15ML (10%) PO SOLN
40.0000 meq | Freq: Three times a day (TID) | ORAL | Status: AC
  Administered 2019-09-11 (×2): 40 meq
  Filled 2019-09-11 (×2): qty 30

## 2019-09-11 MED ORDER — SODIUM CHLORIDE 0.9% FLUSH: 10.0000 mL | INTRAVENOUS | Status: DC | PRN

## 2019-09-11 MED ORDER — METOLAZONE 5 MG PO TABS
5.0000 mg | ORAL_TABLET | Freq: Every day | ORAL | Status: AC
  Administered 2019-09-11: 5 mg via ORAL
  Filled 2019-09-11 (×2): qty 1

## 2019-09-11 MED ORDER — SODIUM CHLORIDE 0.9% FLUSH
10.0000 mL | Freq: Two times a day (BID) | INTRAVENOUS | Status: DC
  Administered 2019-09-12 – 2019-09-15 (×6): 10 mL
  Administered 2019-09-16: 30 mL
  Administered 2019-09-16 – 2019-09-17 (×2): 10 mL
  Administered 2019-09-17: 20 mL
  Administered 2019-09-18 – 2019-09-19 (×3): 10 mL
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
## 2019-09-11 NOTE — Progress Notes (Signed)
[facility] Progress Note Patient Name: [name] DOB: June 24, 1951 MRN: [id number]   Date of Service  09/11/2019  HPI/Events of Note  K+ 2.8  eICU Interventions  Elink electrolyte replacement protocol for K+ ordered        [place] U Ogan 09/11/2019, 6:00 AM

## 2019-09-11 NOTE — Progress Notes (Signed)
Paged Dr. Nelda Marseille concerning PICC order. Patient has had intermittent fevers. Probably aspiration pneumonia per Dr. Nelda Marseille. No plans for blood cultures at this time. Last blood cultures drawn 12/24 and NGTD. Plan to move forward with PICC placement.

## 2019-09-11 NOTE — Progress Notes (Signed)
Peripherally Inserted Central Catheter/Midline Placement  The IV Nurse has discussed with the patient and/or persons authorized to consent for the patient, the purpose of this procedure and the potential benefits and risks involved with this procedure.  The benefits include less needle sticks, lab draws from the catheter, and the patient may be discharged home with the catheter. Risks include, but not limited to, infection, bleeding, blood clot (thrombus formation), and puncture of an artery; nerve damage and irregular heartbeat and possibility to perform a PICC exchange if needed/ordered by physician.  Alternatives to this procedure were also discussed.  Bard Power PICC patient education guide, fact sheet on infection prevention and patient information card has been provided to patient /or left at bedside.  Telephone consent obtained from wife.  PICC/Midline Placement Documentation  PICC Double Lumen 09/11/19 PICC Left Brachial 44 cm 0 cm (Active)  Indication for Insertion or Continuance of Line Vasoactive infusions;Prolonged intravenous therapies 09/11/19 1159  Exposed Catheter (cm) 0 cm 09/11/19 1159  Site Assessment Clean;Dry;Intact 09/11/19 1159  Lumen #1 Status Flushed;Saline locked;Blood return noted 09/11/19 1159  Lumen #2 Status Flushed;Saline locked;Blood return noted 09/11/19 1159  Dressing Type Transparent 09/11/19 1159  Dressing Status Clean;Dry;Intact;Antimicrobial disc in place 09/11/19 [place] checked and tightened 09/11/19 1159  Line Adjustment (NICU/IV Team Only) No 09/11/19 1159  Dressing Intervention New dressing 09/11/19 1159  Dressing Change Due 09/18/19 09/11/19 1159       Rolena Infante 09/11/2019, 12:03 PM

## 2019-09-12 LAB — CULTURE, RESPIRATORY W GRAM STAIN
Gram Stain: NONE SEEN
Special Requests: NORMAL

## 2019-09-12 LAB — MIN INHIBITORY CONC (2 DRUGS)

## 2019-09-12 LAB — GLUCOSE, CAPILLARY
Glucose-Capillary: 39 mg/dL — CL (ref 70–99)
Glucose-Capillary: 135 mg/dL — ABNORMAL HIGH (ref 70–99)
Glucose-Capillary: 232 mg/dL — ABNORMAL HIGH (ref 70–99)
Glucose-Capillary: 293 mg/dL — ABNORMAL HIGH (ref 70–99)
Glucose-Capillary: 253 mg/dL — ABNORMAL HIGH (ref 70–99)
Glucose-Capillary: 242 mg/dL — ABNORMAL HIGH (ref 70–99)

## 2019-09-12 LAB — CBC
HCT: 29.8 % — ABNORMAL LOW (ref 39.0–52.0)
Hemoglobin: 9.1 g/dL — ABNORMAL LOW (ref 13.0–17.0)
MCH: 33.6 pg (ref 26.0–34.0)
MCHC: 30.5 g/dL (ref 30.0–36.0)
MCV: 110 fL — ABNORMAL HIGH (ref 80.0–100.0)
Platelets: 365 K/uL (ref 150–400)
RBC: 2.71 MIL/uL — ABNORMAL LOW (ref 4.22–5.81)
RDW: 22.5 % — ABNORMAL HIGH (ref 11.5–15.5)
WBC: 9.5 K/uL (ref 4.0–10.5)
nRBC: 0 % (ref 0.0–0.2)

## 2019-09-12 LAB — BASIC METABOLIC PANEL
Anion gap: 11 (ref 5–15)
BUN: 54 mg/dL — ABNORMAL HIGH (ref 8–23)
CO2: 38 mmol/L — ABNORMAL HIGH (ref 22–32)
Calcium: 10.7 mg/dL — ABNORMAL HIGH (ref 8.9–10.3)
Chloride: 97 mmol/L — ABNORMAL LOW (ref 98–111)
Creatinine, Ser: 0.77 mg/dL (ref 0.61–1.24)
GFR calc Af Amer: >60 mL/min (ref >60)
GFR calc non Af Amer: >60 mL/min (ref >60)
Glucose, Bld: 106 mg/dL — ABNORMAL HIGH (ref 70–99)
Potassium: 3.5 mmol/L (ref 3.5–5.1)
Sodium: 146 mmol/L — ABNORMAL HIGH (ref 135–145)

## 2019-09-12 LAB — PHOSPHORUS: Phosphorus: 2.6 mg/dL (ref 2.5–4.6)

## 2019-09-12 LAB — MAGNESIUM: Magnesium: 2.5 mg/dL — ABNORMAL HIGH (ref 1.7–2.4)

## 2019-09-12 MED ORDER — FUROSEMIDE 10 MG/ML IJ SOLN
INTRAMUSCULAR | Status: AC
  Filled 2019-09-12: qty 4

## 2019-09-12 MED ORDER — ACETAMINOPHEN 160 MG/5ML PO SOLN
650.0000 mg | Freq: Four times a day (QID) | ORAL | Status: DC | PRN
  Administered 2019-09-13 – 2019-09-18 (×14): 650 mg
  Filled 2019-09-12 (×14): qty 20.3

## 2019-09-12 MED ORDER — INSULIN GLARGINE 100 UNIT/ML ~~LOC~~ SOLN
30.0000 [IU] | Freq: Two times a day (BID) | SUBCUTANEOUS | Status: DC
  Administered 2019-09-12 – 2019-09-13 (×3): 30 [IU] via SUBCUTANEOUS
  Filled 2019-09-12 (×4): qty 0.3

## 2019-09-12 MED ORDER — OXYCODONE HCL 5 MG PO TABS
5.0000 mg | ORAL_TABLET | ORAL | Status: DC | PRN
  Administered 2019-09-13 – 2019-09-18 (×6): 5 mg via ORAL
  Filled 2019-09-12 (×6): qty 1

## 2019-09-12 MED ORDER — POTASSIUM CHLORIDE 20 MEQ/15ML (10%) PO SOLN
20.0000 meq | Freq: Once | ORAL | Status: AC
  Administered 2019-09-12: 20 meq
  Filled 2019-09-12: qty 15

## 2019-09-12 MED ORDER — MUPIROCIN 2 % EX OINT: TOPICAL_OINTMENT | Freq: Two times a day (BID) | CUTANEOUS | Status: DC

## 2019-09-12 MED ORDER — FUROSEMIDE 10 MG/ML IJ SOLN
40.0000 mg | Freq: Once | INTRAMUSCULAR | Status: AC
  Administered 2019-09-12: 40 mg via INTRAVENOUS

## 2019-09-12 NOTE — Consult Note (Addendum)
[facility] Nurse wound follow up Reviewed photos and progress notes in the EMR. PT is performing Vericleanse Vac dressings to assist with removal of nonviable tissue.  Wound type:unstageable PI to coccyx/sacrum; remains with tightly adhered slough/eschar. Loose peeling skin surrounding wound to bilat buttocks with dark purple deep tissue injury areas. These are high risk to evolve into full thickness tissue loss.   Refer to PT measurements and percentages. [place] team will reassess weekly to determine if a change in the plan of care is indicated at that time. Julien Girt MSN, RN, [place], [place], [place]

## 2019-09-12 NOTE — Progress Notes (Signed)
Assisted tele visit to patient with wife.  Leiliana Foody P, RN  

## 2019-09-12 NOTE — Progress Notes (Signed)
Physical Therapy Wound Treatment Patient Details  Name: [name] MRN: [id number] Date of Birth: 08-01-1951  Today's Date: 09/12/2019 Time: 1130-1220 Time Calculation (min): 50 min  Subjective  Subjective: Restless throughout session. Patient and Family Stated Goals: Heal wound - Daughter present throughout session. She visualized the wound and we discussed treatment plan.   Pain Score:  Pt restless but did not seem to be in significant pain.   Wound Assessment  Pressure Injury 08/10/19 Sacrum Mid Unstageable - Full thickness tissue loss in which the base of the injury is covered by slough (yellow, tan, gray, green or brown) and/or eschar (tan, brown or black) in the wound bed. wound has evolved into unstageable (Active)  Dressing Type ABD;Gauze (Comment);Barrier Film (skin prep);Moist to dry 09/12/19 1306  Dressing Clean;Dry;Intact;Changed 09/12/19 1306  Dressing Change Frequency Daily 09/12/19 1306  State of Healing Non-healing 09/12/19 1306  Site / Wound Assessment Pink;Brown;Yellow 09/12/19 1306  % Wound base Red or Granulating 10% 09/12/19 1306  % Wound base Yellow/Fibrinous Exudate 70% 09/12/19 1306  % Wound base Black/Eschar 20% 09/12/19 1306  % Wound base Other/Granulation Tissue (Comment) 0% 09/12/19 1306  Peri-wound Assessment Black;Erythema (non-blanchable);Purple 09/12/19 1306  Wound Length (cm) 5 cm 09/07/19 1400  Wound Width (cm) 8 cm 09/07/19 1400  Wound Depth (cm) 0.1 cm 09/07/19 1400  Wound Surface Area (cm^2) 40 cm^2 09/07/19 1400  Wound Volume (cm^3) 4 cm^3 09/07/19 1400  Margins Unattached edges (unapproximated) 09/12/19 1306  Drainage Amount Minimal 09/12/19 1306  Drainage Description Serosanguineous 09/12/19 1306  Treatment Debridement (Selective);Hydrotherapy (Pulse lavage);Packing (Saline gauze) 09/12/19 1306   Santyl applied to wound bed prior to applying dressing.     Hydrotherapy Pulsed lavage therapy - wound location: Sacrum Pulsed Lavage  with Suction (psi): 8 psi Pulsed Lavage with Suction - Normal Saline Used: 1000 mL Pulsed Lavage Tip: Tip with splash shield Selective Debridement Selective Debridement - Location: Sacrum Selective Debridement - Tools Used: Forceps;Scissors Selective Debridement - Tissue Removed: yellow and black necrotic tissue   Wound Assessment and Plan  Wound Therapy - Assess/Plan/Recommendations Wound Therapy - Clinical Statement: Sacral wound progressing well with necrotic tissue softening and able to be debrided more successfully this session. Noted the area on L buttock appear to have black areas with redness around. Covered this area with a foam dressing prior to putting ABD on for added skin protection. Reached out to Val Riles, [place] RN regarding assessing the wound with hydrotherapy at their next available day on campus. Will hold off on replacing VAC until wound plan is readdressed regarding this new area of skin breakdown. Will see 6x/week until decision is made regarding VAC. Wound Therapy - Functional Problem List: Global weakness in the setting of critical illness.  Factors Delaying/Impairing Wound Healing: Immobility;Multiple medical problems Hydrotherapy Plan: Debridement;Dressing change;Patient/family education;Pulsatile lavage with suction Wound Therapy - Frequency: 6X / week Wound Therapy - Follow Up Recommendations: Skilled nursing facility(vs LTACH) Wound Plan: See above  Wound Therapy Goals- Improve the function of patient's integumentary system by progressing the wound(s) through the phases of wound healing (inflammation - proliferation - remodeling) by: Decrease Necrotic Tissue to: 0 Decrease Necrotic Tissue - Progress: Progressing toward goal Increase Granulation Tissue to: 100 Increase Granulation Tissue - Progress: Progressing toward goal Goals/treatment plan/discharge plan were made with and agreed upon by patient/family: No, Patient unable to participate in  goals/treatment/discharge plan and family unavailable Time For Goal Achievement: 7 days Wound Therapy - Potential for Goals: Good  Goals will be  updated until maximal potential achieved or discharge criteria met.  Discharge criteria: when goals achieved, discharge from hospital, MD decision/surgical intervention, no progress towards goals, refusal/missing three consecutive treatments without notification or medical reason.  GP     [name] 09/12/2019, 3:32 PM   [name], PT, DPT Acute Rehabilitation Services Pager: [phone] Office: [phone]
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
## 2019-09-13 LAB — GLUCOSE, CAPILLARY
Glucose-Capillary: 297 mg/dL — ABNORMAL HIGH (ref 70–99)
Glucose-Capillary: 303 mg/dL — ABNORMAL HIGH (ref 70–99)
Glucose-Capillary: 306 mg/dL — ABNORMAL HIGH (ref 70–99)
Glucose-Capillary: 341 mg/dL — ABNORMAL HIGH (ref 70–99)
Glucose-Capillary: 377 mg/dL — ABNORMAL HIGH (ref 70–99)
Glucose-Capillary: 390 mg/dL — ABNORMAL HIGH (ref 70–99)

## 2019-09-13 LAB — CULTURE, BLOOD (ROUTINE X 2)
Culture: NO GROWTH
Culture: NO GROWTH
Special Requests: ADEQUATE

## 2019-09-13 LAB — URINALYSIS, ROUTINE W REFLEX MICROSCOPIC
Bilirubin Urine: NEGATIVE
Glucose, UA: >=500 mg/dL — AB
Hgb urine dipstick: NEGATIVE
Ketones, ur: NEGATIVE mg/dL
Nitrite: NEGATIVE
Protein, ur: NEGATIVE mg/dL
Specific Gravity, Urine: 1.016 (ref 1.005–1.030)
pH: 6 (ref 5.0–8.0)

## 2019-09-13 LAB — BASIC METABOLIC PANEL
Anion gap: 12 (ref 5–15)
BUN: 53 mg/dL — ABNORMAL HIGH (ref 8–23)
CO2: 35 mmol/L — ABNORMAL HIGH (ref 22–32)
Calcium: 10.4 mg/dL — ABNORMAL HIGH (ref 8.9–10.3)
Chloride: 95 mmol/L — ABNORMAL LOW (ref 98–111)
Creatinine, Ser: 0.76 mg/dL (ref 0.61–1.24)
GFR calc Af Amer: >60 mL/min (ref >60)
GFR calc non Af Amer: >60 mL/min (ref >60)
Glucose, Bld: 361 mg/dL — ABNORMAL HIGH (ref 70–99)
Potassium: 3.2 mmol/L — ABNORMAL LOW (ref 3.5–5.1)
Sodium: 142 mmol/L (ref 135–145)

## 2019-09-13 LAB — PROCALCITONIN: Procalcitonin: 0.24 ng/mL

## 2019-09-13 MED ORDER — INSULIN GLARGINE 100 UNIT/ML ~~LOC~~ SOLN
45.0000 [IU] | Freq: Two times a day (BID) | SUBCUTANEOUS | Status: DC
  Administered 2019-09-13 – 2019-09-19 (×13): 45 [IU] via SUBCUTANEOUS
  Filled 2019-09-13 (×14): qty 0.45

## 2019-09-13 MED ORDER — IPRATROPIUM-ALBUTEROL 0.5-2.5 (3) MG/3ML IN SOLN
3.0000 mL | Freq: Four times a day (QID) | RESPIRATORY_TRACT | Status: DC
  Administered 2019-09-13 – 2019-09-14 (×5): 3 mL via RESPIRATORY_TRACT
  Filled 2019-09-13 (×5): qty 3

## 2019-09-13 MED ORDER — POTASSIUM CHLORIDE 10 MEQ/100ML IV SOLN
10.0000 meq | INTRAVENOUS | Status: AC
  Administered 2019-09-13 (×4): 10 meq via INTRAVENOUS
  Filled 2019-09-13 (×4): qty 100

## 2019-09-13 MED ORDER — SODIUM CHLORIDE 0.9 % IV SOLN
2.0000 g | Freq: Three times a day (TID) | INTRAVENOUS | Status: DC
  Administered 2019-09-13 – 2019-09-18 (×15): 2 g via INTRAVENOUS
  Filled 2019-09-13 (×18): qty 2

## 2019-09-13 MED ORDER — IPRATROPIUM-ALBUTEROL 0.5-2.5 (3) MG/3ML IN SOLN: 3.0000 mL | Freq: Four times a day (QID) | RESPIRATORY_TRACT | Status: DC | PRN

## 2019-09-13 NOTE — Progress Notes (Signed)
[facility] Progress Note Patient Name: [name] DOB: [birth year] MRN: [id number]   Date of Service  09/13/2019  HPI/Events of Note  Temp 102.5 despite Tylenol  eICU Interventions  Cooling blanket orderd        Frederik Pear 09/13/2019, 5:42 AM

## 2019-09-13 NOTE — Consult Note (Addendum)
[place] Nurse wound follow up Refer to consult note yesterday.  [place] team discussed plan of care with physical therapy team via secure chat.  They are no longer performing the Vericlease dressing.  Wound remains with significant amount of tightly adhered slough/eschar.  They will resume traditional hydrotherapy Mon-Sat to attempt to remove the nonviable tissue from the Unstageable sacrum pressure injury.  There areas surrounding the wound have dark red-purple deep tissue injuries and loose peeling skin.  They are high risk to evolve into full thickness tissue loss.  Begin xeroform gauze to attempt to promote drying and healing of the sites to bilat buttocks.  Dressing procedure/placement/frequency: Topical treatment orders revised for physical therapy and the bedside nurses to perform as follows: BEDSIDE NURSE:  Change dressing to sacrum Q SUN as follows: Apply Santyl and moist gauze dressing, then ABD pads and tape. Apply xeroform gauze to areas surrounding wound edges. (PT to perform hydrotherapy and dressing changes to sacrum wound Q Mon-Sat) WOC team will reassess the location weekly and adjust the plan of care at that time if indicated.  Julien Girt MSN, RN, [place], [place], [place]

## 2019-09-13 NOTE — Progress Notes (Signed)
PROGRESS NOTE    [name]  [name] DOB: 1951/07/04 DOA: 08/12/2019 PCP: McLean-Scocuzza, Nino Glow, MD  Outpatient Specialists:   Brief Narrative:  Patient is a 68 year old male with past medical history significant for diabetes mellitus type 2 on insulin, status post BKA, nephrolithiasis, PVD, hypertension, hyperlipidemia, Pseudomonas pneumonia, history of MRSA, gastroparesis, hyperparathyroidism, fatty liver, esophageal stricture, diastolic CHF, severe COPD, carotid artery stenosis, BPH, fatty liver and gastroparesis amongst other medical and cardiac problems.  Patient also has extensive surgical history.  Patient was admitted with Covid pneumonia on 07/31/2019, after having tested positive on 07/27/2019.  Patient was intubated 08/05/2019, was eventually transferred to [facility] on 08/06/2019.  Patient was transferred to [facility] on 08/18/2019, was trached on 08/20/2019.  Hospitalist team assumed patient's care on 09/13/2019.  Patient remains critically ill, continues to have fever and not very responsive.  Per prior documentation, patient has been on multiple antibiotics: "Decadron 11/19 >11/29 Remdesivir  11/19 > 11/23  Ceftriaxone 11/19 > 11/23 Azithro 11/19 > 11/23  Pip-tazo 11/26  > 11/28 Levofloxacin 11/26 > 11/28  Aztreonam 11/28 >  12/21 Ceftaz-avibactam 11/28 >  12/12  Zosyn 12/24>>>  Vancomycin 12/24>>>"  IV Zosyn was changed to IV cefepime on 09/13/2019.  As per prior documentation, resolved hospital problems include: "DKA HCAP- s/p 14 day course of avycaz, azabactam finished 08/27/19 ARDS secondary to COVID pneumonia "  09/13/2019: We will change IV Zosyn to IV cefepime.  We will panculture the patient.  Blood sugar is poorly controlled.  BUN is elevated.  Patient is on tube feeds.  Patient has significant sacral decubitus ulcer, patient had wound VAC to the sacral decubitus ulcer but the wound VAC is currently on hold.  Guarded  prognosis.  Patient stable to give any history.   Assessment & Plan:   Principal Problem:   Pneumonia due to COVID-19 virus Active Problems:   ANEMIA, IRON DEFICIENCY   CAD (coronary artery disease)   COPD, severe (HCC)   Gastroesophageal reflux disease with hiatal hernia   Diabetes mellitus type 2 in obese ([place])   COVID-19   ARDS (adult respiratory distress syndrome) (HCC)   Pressure injury of skin   Acute respiratory failure (HCC)   Acute encephalopathy   Fever: -Panculture patient (blood culture, urinalysis and urine culture from Foley catheter and wound culture). -Tracheal aspirate grew Pseudomonas.  Change Zosyn to cefepime. -Check procalcitonin level. -Further management depend on hospital course.  Diabetes mellitus, uncontrolled: -Patient is on subcutaneous Lantus insulin 30 units twice daily. -Patient is also on sliding scale insulin coverage every 4 hours. -Blood sugar is currently running in the upper 300s. -Increase subcutaneous Lantus to 45 units twice daily. -Consult podiatry team to review tube feeds.  Sacral decubitus ulcer: Likely pressure ulcers Wound care and PT is appreciated Follow wound cultures Wound VAC is currently on hold.  Acute respiratory failure: -Recent Covid -Initially intubated, now on trach -Checks x-ray series revealing multiple quadrant infiltrates -Start DuoNeb -Steroids may have caused significantly elevated blood sugar  Acute encephalopathy: -Patient remains encephalopathic. -No response from patient during this consultation.  Tracheitis/possible pneumonia secondary to Pseudomonas aeruginosa: -Tracheal aspirate grew Pseudomonas aeruginosa. -IV Zosyn has been changed to IV cefepime. -Patient continues to have fever.  Diabetes mellitus, uncontrolled: -Increase subcutaneous Lantus to 45 units twice daily. -Continue resistant insulin sliding scale coverage every 4 hours. -Continue to monitor closely. -Dietary team to adjust  tube feeds.  Hypertension: Continue to optimize.  Hypernatremia: Resolved.  Atrial fibrillation: Rate controlled. Continue Eliquis.  Guarded prognosis.  DVT prophylaxis: Apixaban Code Status: Full code.  Will consult palliative care team Family Communication:  Disposition Plan: This will depend on hospital course   Consultants:   Patient was transferred from ICU team.  Procedures:   Status post trach  Antimicrobials:   Discontinue Zosyn today, 09/13/2019.    Start cefepime today, 09/13/2019.    Subjective: No history from patient T-max of 103.2  Objective: Vitals:   09/13/19 0925 09/13/19 0935 09/13/19 1113 09/13/19 1128  BP:   (!) 152/58   Pulse: 99 96 (!) 113   Resp: (!) 29 (!) 25 (!) 29   Temp:  99.1 F (37.3 C)  99.2 F (37.3 C)  TempSrc:  Oral    SpO2: 99% 98% 98%   Weight:      Height:        Intake/Output Summary (Last 24 hours) at 09/13/2019 1223 Last data filed at 09/13/2019 1125 Gross per 24 hour  Intake 2500.32 ml  Output 2675 ml  Net -174.68 ml   Filed Weights   09/07/19 0500 09/09/19 0453 09/10/19 0500  Weight: 118.5 kg 117 kg 116 kg    Examination:  General exam: Appears calm and comfortable, but not responding to verbal stimulation and minimally to tactile stimulation.  Patient has trach. Respiratory system: Decreased air entry globally, inspiratory and expiratory wheeze with transmitted sounds from upper airways.   Cardiovascular system: S1 & S2 heard Gastrointestinal system: Abdomen is obese, soft and nontender.  Organs are difficult to assess.   Central nervous system: Patient is not responsive.   Extremities: Status post right BKA.  No left leg edema    Data Reviewed: I have personally reviewed following labs and imaging studies  CBC: Recent Labs  Lab 09/08/19 0251 09/09/19 0348 09/09/19 1047 09/09/19 1521 09/10/19 0427 09/11/19 0257 09/12/19 0325  WBC 8.7 7.3  --   --  9.2 10.2 9.5  HGB 8.0* 6.8* 7.7* 8.6*  8.3* 8.9* 9.1*  HCT 27.5* 22.7* 22.9* 27.0* 26.0* 28.0* 29.8*  MCV 117.5* 114.1*  --   --  105.3* 106.1* 110.0*  PLT 366 294  --   --  336 360 [id number]   Basic Metabolic Panel: Recent Labs  Lab 09/08/19 0251 09/09/19 0348 09/10/19 0427 09/11/19 0257 09/12/19 0325 09/13/19 0434  NA 148* 146* 145 144 146* 142  K 4.9 2.8* 3.1* 2.8* 3.5 3.2*  CL 115* 105 100 96* 97* 95*  CO2 28 30 35* 36* 38* 35*  GLUCOSE 195* 100* 129* 141* 106* 361*  BUN 47* 57* 48* 54* 54* 53*  CREATININE 0.64 0.72 0.70 0.70 0.77 0.76  CALCIUM 9.8 9.8 9.5 10.2 10.7* 10.4*  MG 2.6* 2.0 2.0 2.1 2.5*  --   PHOS 3.5 2.9 3.1 2.2* 2.6  --    GFR: Estimated Creatinine Clearance: 121.1 mL/min (by C-G formula based on SCr of 0.76 mg/dL). Liver Function Tests: No results for input(s): AST, ALT, ALKPHOS, BILITOT, PROT, ALBUMIN in the last 168 hours. No results for input(s): LIPASE, AMYLASE in the last 168 hours. No results for input(s): AMMONIA in the last 168 hours. Coagulation Profile: No results for input(s): INR, PROTIME in the last 168 hours. Cardiac Enzymes: No results for input(s): CKTOTAL, CKMB, CKMBINDEX, TROPONINI in the last 168 hours. BNP (last 3 results) No results for input(s): PROBNP in the last 8760 hours. HbA1C: No results for input(s): HGBA1C in the last 72 hours. CBG: Recent Labs  Lab 09/12/19  [place] 09/12/19 1954 09/13/19 0048 09/13/19 0417 09/13/19 0817  GLUCAP 253* 242* 377* 341* 390*   Lipid Profile: No results for input(s): CHOL, HDL, LDLCALC, TRIG, CHOLHDL, LDLDIRECT in the last 72 hours. Thyroid Function Tests: No results for input(s): TSH, T4TOTAL, FREET4, T3FREE, THYROIDAB in the last 72 hours. Anemia Panel: No results for input(s): VITAMINB12, FOLATE, FERRITIN, TIBC, IRON, RETICCTPCT in the last 72 hours. Urine analysis:    Component Value Date/Time   COLORURINE YELLOW 08/28/2019 0045   APPEARANCEUR CLOUDY (A) 08/28/2019 0045   LABSPEC 1.020 08/28/2019 0045   PHURINE 6.0  08/28/2019 0045   GLUCOSEU NEGATIVE 08/28/2019 0045   GLUCOSEU 500 03/25/2012 1101   HGBUR NEGATIVE 08/28/2019 0045   BILIRUBINUR NEGATIVE 08/28/2019 0045   BILIRUBINUR negative 10/31/2015 1514   BILIRUBINUR neg 01/19/2013 1125   KETONESUR NEGATIVE 08/28/2019 0045   PROTEINUR NEGATIVE 08/28/2019 0045   UROBILINOGEN >=8.0 10/31/2015 1514   UROBILINOGEN 1.0 03/25/2012 1101   NITRITE NEGATIVE 08/28/2019 0045   LEUKOCYTESUR MODERATE (A) 08/28/2019 0045   Sepsis Labs: @LABRCNTIP (procalcitonin:4,lacticidven:4)  ) Recent Results (from the past 240 hour(s))  Culture, blood (routine x 2)     Status: None   Collection Time: 09/08/19 12:12 PM   Specimen: BLOOD LEFT HAND  Result Value Ref Range Status   Specimen Description BLOOD LEFT HAND  Final   Special Requests   Final    BOTTLES DRAWN AEROBIC AND ANAEROBIC Blood Culture adequate volume   Culture   Final    NO GROWTH 5 DAYS Performed at [facility], [place] [address], [place], [place] [address]    Report Status 09/13/2019 FINAL  Final  Culture, blood (routine x 2)     Status: None   Collection Time: 09/08/19 12:20 PM   Specimen: BLOOD RIGHT HAND  Result Value Ref Range Status   Specimen Description BLOOD RIGHT HAND  Final   Special Requests   Final    BOTTLES DRAWN AEROBIC ONLY Blood Culture results may not be optimal due to an inadequate volume of blood received in culture bottles   Culture   Final    NO GROWTH 5 DAYS Performed at [facility], [place] [address], [place], [place] [address]    Report Status 09/13/2019 FINAL  Final  MRSA PCR Screening     Status: None   Collection Time: 09/08/19  1:09 PM   Specimen: Nasal Mucosa; Nasopharyngeal  Result Value Ref Range Status   MRSA by PCR NEGATIVE NEGATIVE Final    Comment:        The GeneXpert MRSA Assay (FDA approved for NASAL specimens only), is one component of a comprehensive MRSA colonization surveillance program. It is not intended to diagnose  MRSA infection nor to guide or monitor treatment for MRSA infections. Performed at [facility], [place] [address], [place], [place] [address]   Culture, respiratory     Status: None   Collection Time: 09/08/19  2:33 PM   Specimen: Tracheal Aspirate  Result Value Ref Range Status   Specimen Description TRACHEAL ASPIRATE  Final   Special Requests NONE  Final   Gram Stain   Final    MODERATE WBC PRESENT, PREDOMINANTLY PMN RARE SQUAMOUS EPITHELIAL CELLS PRESENT ABUNDANT GRAM POSITIVE COCCI MODERATE GRAM NEGATIVE RODS RARE GRAM POSITIVE RODS Performed at [facility], [address], [place], [place] [address]    Culture   Final    MODERATE GROUP B STREP(S.AGALACTIAE)ISOLATED TESTING AGAINST S. AGALACTIAE NOT ROUTINELY  PERFORMED DUE TO PREDICTABILITY OF AMP/PEN/VAN SUSCEPTIBILITY. FEW PSEUDOMONAS AERUGINOSA    Report Status 09/12/2019 FINAL  Final   Organism ID, Bacteria PSEUDOMONAS AERUGINOSA  Final      Susceptibility   Pseudomonas aeruginosa - MIC*    CEFTAZIDIME 4 SENSITIVE Sensitive     CIPROFLOXACIN >=4 RESISTANT Resistant     GENTAMICIN 2 SENSITIVE Sensitive     IMIPENEM >=16 RESISTANT Resistant     PIP/TAZO 8 SENSITIVE Sensitive     CEFEPIME 4 SENSITIVE Sensitive     * FEW PSEUDOMONAS AERUGINOSA         Radiology Studies: No results found.      Scheduled Meds: . apixaban  5 mg Per Tube BID  . chlorhexidine  15 mL Mouth/Throat BID  . Chlorhexidine Gluconate Cloth  6 each Topical Daily  . collagenase   Topical Daily  . Crystal Light Powder (Potassium Citrate)   1 packet Per Tube Daily  . diltiazem  60 mg Per Tube Q6H  . feeding supplement (PRO-STAT SUGAR FREE 64)  60 mL Per Tube BID  . folic acid  1 mg Per Tube Daily  . free water  400 mL Per Tube Q4H  . insulin aspart  0-20 Units Subcutaneous Q4H  . insulin glargine  45 Units Subcutaneous BID  . ipratropium-albuterol  3 mL Nebulization Q6H  . mouth rinse  15 mL Mouth Rinse 10 times per day  .  Melatonin  3 mg Per Tube QHS  . metoprolol tartrate  100 mg Per Tube BID  . multivitamin  15 mL Per Tube Daily  . nutrition supplement (JUVEN)  1 packet Per Tube BID  . pantoprazole sodium  40 mg Per Tube Daily  . psyllium  1 packet Per Tube BID  . sodium chloride flush  10-40 mL Intracatheter Q12H  . sodium chloride flush  10-40 mL Intracatheter Q12H  . thiamine  100 mg Per Tube Daily  . vitamin B-12  1,000 mcg Per Tube Daily   Continuous Infusions: . sodium chloride Stopped (08/27/19 1946)  . sodium chloride 10 mL/hr at 09/12/19 2300  . feeding supplement (VITAL 1.5 CAL) 1,000 mL (09/13/19 0530)     LOS: 40 days    Time spent: 35 Minutes  Dana Allan, MD  [facility] Pager #: [phone] 7PM-7AM contact night coverage as above

## 2019-09-13 NOTE — Progress Notes (Signed)
Physical Therapy Wound Treatment Patient Details  Name: [name] MRN: [id number] Date of Birth: 10-11-1950  Today's Date: 09/13/2019 Time: [id number] Time Calculation (min): 55 min  Subjective  Subjective: Lethargic mostly - restless at times but overall tolerated treatment well.  Patient and Family Stated Goals: No family present during session today.   Pain Score:  Pt tolerated treatment well without premedication.  Wound Assessment  Pressure Injury 08/10/19 Sacrum Mid Unstageable - Full thickness tissue loss in which the base of the injury is covered by slough (yellow, tan, gray, green or brown) and/or eschar (tan, brown or black) in the wound bed. wound has evolved into unstageable (Active)  Wound Image   09/13/19 1253  Dressing Type ABD;Gauze (Comment);Moist to dry;Barrier Film (skin prep);Non adherent 09/13/19 1253  Dressing Clean;Dry;Intact;Changed 09/13/19 1253  Dressing Change Frequency Daily 09/13/19 1253  State of Healing Non-healing 09/13/19 1253  Site / Wound Assessment Pink;Yellow;Brown 09/13/19 1253  % Wound base Red or Granulating 15% 09/13/19 1253  % Wound base Yellow/Fibrinous Exudate 75% 09/13/19 1253  % Wound base Black/Eschar 10% 09/13/19 1253  % Wound base Other/Granulation Tissue (Comment) 0% 09/13/19 1253  Peri-wound Assessment Erythema (non-blanchable);Purple 09/13/19 1253  Wound Length (cm) 5 cm 09/07/19 1400  Wound Width (cm) 8 cm 09/07/19 1400  Wound Depth (cm) 0.1 cm 09/07/19 1400  Wound Surface Area (cm^2) 40 cm^2 09/07/19 1400  Wound Volume (cm^3) 4 cm^3 09/07/19 1400  Margins Unattached edges (unapproximated) 09/13/19 1253  Drainage Amount Minimal 09/13/19 1253  Drainage Description Serosanguineous 09/13/19 1253  Treatment Debridement (Selective);Hydrotherapy (Pulse lavage);Packing (Saline gauze) 09/13/19 1253   Santyl applied to wound bed prior to applying dressing.     Hydrotherapy Pulsed lavage therapy - wound location: Sacrum Pulsed  Lavage with Suction (psi): 12 psi Pulsed Lavage with Suction - Normal Saline Used: 1000 mL Pulsed Lavage Tip: Tip with splash shield Selective Debridement Selective Debridement - Location: Sacrum Selective Debridement - Tools Used: Forceps;Scissors Selective Debridement - Tissue Removed: yellow and brown necrotic tissue   Wound Assessment and Plan  Wound Therapy - Assess/Plan/Recommendations Wound Therapy - Clinical Statement: Discussed pt case with Dawn, WOC-RN. Wound plan is now for wet>dry dressing over sacrum with Santyl, and Xeroform on inferior periwound area. This patient will benefit from continued hydrotherapy for selective removal of unviable tissue, to decrease bioburden and promote wound bed healing.  Wound Therapy - Functional Problem List: Global weakness in the setting of critical illness.  Factors Delaying/Impairing Wound Healing: Immobility;Multiple medical problems Hydrotherapy Plan: Debridement;Dressing change;Patient/family education;Pulsatile lavage with suction Wound Therapy - Frequency: 6X / week Wound Therapy - Follow Up Recommendations: Skilled nursing facility(vs LTACH) Wound Plan: See above  Wound Therapy Goals- Improve the function of patient's integumentary system by progressing the wound(s) through the phases of wound healing (inflammation - proliferation - remodeling) by: Decrease Necrotic Tissue to: 0 Decrease Necrotic Tissue - Progress: Progressing toward goal Increase Granulation Tissue to: 100 Increase Granulation Tissue - Progress: Progressing toward goal Goals/treatment plan/discharge plan were made with and agreed upon by patient/family: No, Patient unable to participate in goals/treatment/discharge plan and family unavailable Time For Goal Achievement: 7 days Wound Therapy - Potential for Goals: Good  Goals will be updated until maximal potential achieved or discharge criteria met.  Discharge criteria: when goals achieved, discharge from hospital, MD  decision/surgical intervention, no progress towards goals, refusal/missing three consecutive treatments without notification or medical reason.  GP     Thelma Comp 09/13/2019, 1:10 PM  Rolinda Roan, PT, DPT Acute Rehabilitation Services Pager: [phone] Office: [phone]

## 2019-09-14 ENCOUNTER — Inpatient Hospital Stay (HOSPITAL_COMMUNITY): Payer: Medicare Other

## 2019-09-14 LAB — MAGNESIUM: Magnesium: 2.4 mg/dL (ref 1.7–2.4)

## 2019-09-14 LAB — GLUCOSE, CAPILLARY
Glucose-Capillary: 193 mg/dL — ABNORMAL HIGH (ref 70–99)
Glucose-Capillary: 221 mg/dL — ABNORMAL HIGH (ref 70–99)
Glucose-Capillary: 222 mg/dL — ABNORMAL HIGH (ref 70–99)
Glucose-Capillary: 250 mg/dL — ABNORMAL HIGH (ref 70–99)
Glucose-Capillary: 251 mg/dL — ABNORMAL HIGH (ref 70–99)
Glucose-Capillary: 378 mg/dL — ABNORMAL HIGH (ref 70–99)

## 2019-09-14 LAB — URINE CULTURE: Culture: NO GROWTH

## 2019-09-14 LAB — RENAL FUNCTION PANEL
Albumin: 2.4 g/dL — ABNORMAL LOW (ref 3.5–5.0)
Anion gap: 8 (ref 5–15)
BUN: 39 mg/dL — ABNORMAL HIGH (ref 8–23)
CO2: 35 mmol/L — ABNORMAL HIGH (ref 22–32)
Calcium: 10.3 mg/dL (ref 8.9–10.3)
Chloride: 101 mmol/L (ref 98–111)
Creatinine, Ser: 0.55 mg/dL — ABNORMAL LOW (ref 0.61–1.24)
GFR calc Af Amer: >60 mL/min (ref >60)
GFR calc non Af Amer: >60 mL/min (ref >60)
Glucose, Bld: 239 mg/dL — ABNORMAL HIGH (ref 70–99)
Phosphorus: 1.9 mg/dL — ABNORMAL LOW (ref 2.5–4.6)
Potassium: 3.6 mmol/L (ref 3.5–5.1)
Sodium: 144 mmol/L (ref 135–145)

## 2019-09-14 LAB — CBC WITH DIFFERENTIAL/PLATELET
Abs Immature Granulocytes: 0 10*3/uL (ref 0.00–0.07)
Basophils Absolute: 0.4 10*3/uL — ABNORMAL HIGH (ref 0.0–0.1)
Basophils Relative: 4 %
Eosinophils Absolute: 0.3 10*3/uL (ref 0.0–0.5)
Eosinophils Relative: 3 %
HCT: 29 % — ABNORMAL LOW (ref 39.0–52.0)
Hemoglobin: 8.7 g/dL — ABNORMAL LOW (ref 13.0–17.0)
Lymphocytes Relative: 23 %
Lymphs Abs: 2 10*3/uL (ref 0.7–4.0)
MCH: 33.6 pg (ref 26.0–34.0)
MCHC: 30 g/dL (ref 30.0–36.0)
MCV: 112 fL — ABNORMAL HIGH (ref 80.0–100.0)
Monocytes Absolute: 0.4 10*3/uL (ref 0.1–1.0)
Monocytes Relative: 5 %
Neutro Abs: 5.7 10*3/uL (ref 1.7–7.7)
Neutrophils Relative %: 65 %
Platelets: 309 10*3/uL (ref 150–400)
RBC: 2.59 MIL/uL — ABNORMAL LOW (ref 4.22–5.81)
RDW: 21.7 % — ABNORMAL HIGH (ref 11.5–15.5)
WBC: 8.8 10*3/uL (ref 4.0–10.5)
nRBC: 0 % (ref 0.0–0.2)
nRBC: 0 /100 WBC

## 2019-09-14 MED ORDER — INSULIN ASPART 100 UNIT/ML ~~LOC~~ SOLN
0.0000 [IU] | SUBCUTANEOUS | Status: DC
  Administered 2019-09-14: 4 [IU] via SUBCUTANEOUS
  Administered 2019-09-14: 7 [IU] via SUBCUTANEOUS
  Administered 2019-09-15: 15 [IU] via SUBCUTANEOUS
  Administered 2019-09-15: 11 [IU] via SUBCUTANEOUS
  Administered 2019-09-15: 4 [IU] via SUBCUTANEOUS
  Administered 2019-09-15 – 2019-09-16 (×4): 7 [IU] via SUBCUTANEOUS
  Administered 2019-09-16: 4 [IU] via SUBCUTANEOUS
  Administered 2019-09-16 – 2019-09-17 (×2): 11 [IU] via SUBCUTANEOUS
  Administered 2019-09-17: 4 [IU] via SUBCUTANEOUS
  Administered 2019-09-17: 11 [IU] via SUBCUTANEOUS
  Administered 2019-09-17: 7 [IU] via SUBCUTANEOUS
  Administered 2019-09-17 (×2): 4 [IU] via SUBCUTANEOUS
  Administered 2019-09-18: 3 [IU] via SUBCUTANEOUS
  Administered 2019-09-18: 4 [IU] via SUBCUTANEOUS
  Administered 2019-09-18: 7 [IU] via SUBCUTANEOUS

## 2019-09-14 MED ORDER — ACETAMINOPHEN 160 MG/5ML PO SOLN
325.0000 mg | Freq: Once | ORAL | Status: AC
  Administered 2019-09-14: 325 mg
  Filled 2019-09-14: qty 20.3

## 2019-09-14 MED ORDER — VITAL AF 1.2 CAL PO LIQD
1000.0000 mL | ORAL | Status: DC
  Administered 2019-09-14 – 2019-09-18 (×5): 1000 mL
  Filled 2019-09-14 (×10): qty 1000

## 2019-09-14 MED ORDER — IPRATROPIUM BROMIDE 0.02 % IN SOLN
0.5000 mg | Freq: Four times a day (QID) | RESPIRATORY_TRACT | Status: DC
  Administered 2019-09-14 – 2019-09-19 (×20): 0.5 mg via RESPIRATORY_TRACT
  Filled 2019-09-14 (×20): qty 2.5

## 2019-09-14 MED ORDER — LEVALBUTEROL HCL 0.63 MG/3ML IN NEBU
0.6300 mg | INHALATION_SOLUTION | Freq: Four times a day (QID) | RESPIRATORY_TRACT | Status: DC
  Administered 2019-09-14 – 2019-09-19 (×20): 0.63 mg via RESPIRATORY_TRACT
  Filled 2019-09-14 (×20): qty 3

## 2019-09-14 MED ORDER — SODIUM CHLORIDE 3 % IN NEBU
4.0000 mL | INHALATION_SOLUTION | Freq: Two times a day (BID) | RESPIRATORY_TRACT | Status: AC
  Administered 2019-09-14 – 2019-09-16 (×4): 4 mL via RESPIRATORY_TRACT
  Filled 2019-09-14 (×5): qty 4

## 2019-09-14 MED ORDER — SODIUM CHLORIDE 0.9 % IV BOLUS
500.0000 mL | Freq: Once | INTRAVENOUS | Status: AC
  Administered 2019-09-14: 500 mL via INTRAVENOUS

## 2019-09-14 MED ORDER — PRO-STAT SUGAR FREE PO LIQD
60.0000 mL | Freq: Every day | ORAL | Status: DC
  Administered 2019-09-15 – 2019-09-18 (×4): 60 mL
  Filled 2019-09-14 (×4): qty 60

## 2019-09-14 NOTE — Progress Notes (Signed)
Nutrition Follow-up  DOCUMENTATION CODES:   Obesity unspecified  INTERVENTION:   TF via Cortrak: -Switch to Vital AF 1.2 @ 75 ml/hr -60 ml Prostat daily -Continue free water flushes of 400 ml every 4 hours per MD order -This regimen provides 2360 kcals, 165g protein and 3859 ml H2O. Also 199g CHO daily.  -Continue Juven BIDvia tube, each packet provides 80 calories, 8 grams of carbohydrate, 2.5grams of protein (collagen), 7 grams of L-arginine and 7 grams of L-glutamine; supplement contains CaHMB, Vitamins C, E, B12 and Zinc to promote wound healing  NUTRITION DIAGNOSIS:   Increased nutrient needs related to acute illness(COVID-19 positive) as evidenced by estimated needs.  Ongoing.   GOAL:   Patient will meet greater than or equal to 90% of their needs  Meeting with TF.  MONITOR:   Labs, Weight trends, TF tolerance, Skin, I & O's  REASON FOR ASSESSMENT:   Consult Enteral/tube feeding initiation and management  ASSESSMENT:   68 y.o. male with medical history of DM, HTN, PVD s/p right BKA, BPH, chronic pain syndrome, COPD, and hyperlipidemia. He presented to the ED on 11/19 due to SOB, cough, wheezing, and confusion. He was diagnosed with COVID-19 on 11/15. He was 71% on RA when EMS arrived and he was placed on NRB. In the ED he had a temperature of 102.1. CXR showed multifocal PNA and pattern consistent with COVID-19.   11/25 - Cortrak tube placed, tip in stomach 12/04 - s/p trach by ENT 12/21 - Cortrak tube advanced to postpyloric position 12/22 - TC trial x 24 hours  Patient transferred to [facility] 12/29. Pt is back on trach collar.   RD consulted to adjust TF order given pt's elevated CBGs. With current regimen of Vital 1.5 @ 65 ml/hr, this is providing ~291g of CHO daily.  Will switch to Vital AF 1.2 @ 75 ml/hr which will provide ~199g of CHO daily.  Admission weight: 231 lbs. Current weight (12/26): 255 lbs.  I/Os: -5.1L since 12/16  UOP: 2.8L x 24  hrs  Medications: Folic acid tablet, Liquid MVI, Thiamine tablet, Vitamin B-12  Diet Order:   Diet Order            Diet NPO time specified  Diet effective now              EDUCATION NEEDS:   No education needs have been identified at this time  Skin:  Skin Assessment: Skin Integrity Issues: Skin Integrity Issues:: Stage III, Unstageable, Wound VAC DTI: N/A Stage II: penis Stage III: N/A Unstageable: sacrum Wound Vac: sacrum Incisions: neck Other: skin tear to right arm  Last BM:  12/30 -type 7  Height:   Ht Readings from Last 1 Encounters:  09/08/19 6\' 1"  (1.854 m)    Weight:   Wt Readings from Last 1 Encounters:  09/10/19 116 kg    Ideal Body Weight:  78.2 kg(adjusted for BKA)  BMI:  Body mass index is 33.74 kg/m.  Estimated Nutritional Needs:   Kcal:  [id number]  Protein:  145-155g  Fluid:  >/= 2.6 L  Clayton Bibles, MS, RD, LDN Inpatient Clinical Dietitian Pager: [phone] After Hours Pager: [phone]
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
## 2019-09-14 NOTE — Progress Notes (Signed)
PROGRESS NOTE    [name]  [name] DOB: 01-15-1951 DOA: 08/05/2019 PCP: McLean-Scocuzza, Nino Glow, MD  Outpatient Specialists:   Brief Narrative:  Patient is a 68 year old male with past medical history significant for diabetes mellitus type 2 on insulin, status post BKA, nephrolithiasis, PVD, hypertension, hyperlipidemia, Pseudomonas pneumonia, history of MRSA, gastroparesis, hyperparathyroidism, fatty liver, esophageal stricture, diastolic CHF, severe COPD, carotid artery stenosis, BPH, fatty liver and gastroparesis amongst other medical and cardiac problems.  Patient also has extensive surgical history.  Patient was admitted with Covid pneumonia on 07/28/2019, after having tested positive on 07/27/2019.  Patient was intubated 08/05/2019, was eventually transferred to [facility] on 08/06/2019.  Patient was transferred to [facility] on 08/18/2019, was trached on 08/20/2019.  Hospitalist team assumed patient's care on 09/13/2019.  Patient remains critically ill, continues to have fever and not very responsive.  Per prior documentation, patient has been on multiple antibiotics: "Decadron 11/19 >11/29 Remdesivir  11/19 > 11/23  Ceftriaxone 11/19 > 11/23 Azithro 11/19 > 11/23  Pip-tazo 11/26  > 11/28 Levofloxacin 11/26 > 11/28  Aztreonam 11/28 >  12/21 Ceftaz-avibactam 11/28 >  12/12  Zosyn 12/24>>>  Vancomycin 12/24>>>"  IV Zosyn was changed to IV cefepime on 09/13/2019.  As per prior documentation, resolved hospital problems include: "DKA HCAP- s/p 14 day course of avycaz, azabactam finished 08/27/19 ARDS secondary to COVID pneumonia "  09/13/2019: We will change IV Zosyn to IV cefepime.  We will panculture the patient.  Blood sugar is poorly controlled.  BUN is elevated.  Patient is on tube feeds.  Patient has significant sacral decubitus ulcer, patient had wound VAC to the sacral decubitus ulcer but the wound VAC is currently on hold.  Guarded  prognosis.  Patient stable to give any history.  09/14/2019: Patient seen alongside patient's nurse.  No significant change overnight.  Patient continues to have fever.  Thick, nasty looking tracheal secretions reported.  We will send tracheal aspirate for cultures.  We will proceed with wound culture.  Hypertonic saline nebulizer.  Low threshold to consult infectious disease team.   Assessment & Plan:   Principal Problem:   Pneumonia due to COVID-19 virus Active Problems:   ANEMIA, IRON DEFICIENCY   CAD (coronary artery disease)   COPD, severe (HCC)   Gastroesophageal reflux disease with hiatal hernia   Diabetes mellitus type 2 in obese ([place])   COVID-19   ARDS (adult respiratory distress syndrome) (HCC)   Pressure injury of skin   Acute respiratory failure (HCC)   Acute encephalopathy   Fever: -Panculture patient (blood culture, urinalysis and urine culture from Foley catheter and wound culture). -Tracheal aspirate grew Pseudomonas.  Change Zosyn to cefepime. -Check procalcitonin level. -Further management depend on hospital course. 09/14/2019: Patient seen.  No new changes.  Fever continues.  Work-up as documented above.  Will have low threshold to consult infectious disease team.  Diabetes mellitus, uncontrolled: -Patient is on subcutaneous Lantus insulin 30 units twice daily. -Patient is also on sliding scale insulin coverage every 4 hours. -Blood sugar is currently running in the upper 300s. -Increase subcutaneous Lantus to 45 units twice daily. -Consult podiatry team to review tube feeds. 09/14/2019: Some improvement in blood sugar control.  Continue to optimize.  Sacral decubitus ulcer: Likely pressure ulcers Wound care and PT is appreciated Follow wound cultures Wound VAC is currently on hold.  Acute respiratory failure: -Recent Covid -Initially intubated, now on trach -Checks x-ray series revealing multiple quadrant infiltrates -Start  DuoNeb 09/14/2019:  Improved air entry.  Continue nebs.  Acute encephalopathy: -Patient remains encephalopathic. -No response from patient during this consultation. 09/14/2019: Patient is more responsive today, but is only intermittent..  Tracheitis/possible pneumonia secondary to Pseudomonas aeruginosa: -Tracheal aspirate grew Pseudomonas aeruginosa. -IV Zosyn has been changed to IV cefepime. -Patient continues to have fever. 09/14/2019: Continue IV cefepime.  Diabetes mellitus, uncontrolled: -Increase subcutaneous Lantus to 45 units twice daily. -Continue resistant insulin sliding scale coverage every 4 hours. -Continue to monitor closely. -Dietary team to adjust tube feeds. 09/14/2019: Blood sugar control has not been optimized.  Continue to monitor and optimize.  Hypertension: Continue to optimize.  Hypernatremia: Resolved.  Atrial fibrillation: Rate controlled. Continue Eliquis. 09/14/2019: Heart rate has ranged from 96 to 130 bpm.  Change nebs albuterol to Xopenex.  Guarded prognosis.  DVT prophylaxis: Apixaban Code Status: Full code.  Will consult palliative care team Family Communication:  Disposition Plan: This will depend on hospital course   Consultants:   Patient was transferred from ICU team.  Procedures:   Status post trach  Antimicrobials:   Discontinue Zosyn today, 09/13/2019.    Start cefepime today, 09/13/2019.    Subjective: No history from patient T-max of 103.2  Objective: Vitals:   09/14/19 0900 09/14/19 1136 09/14/19 1143 09/14/19 1438  BP:  127/67    Pulse:  (!) 102 100 (!) 121  Resp:  19 (!) 25 (!) 26  Temp: (!) 100.8 F (38.2 C) (!) 101.2 F (38.4 C)    TempSrc: Axillary Axillary    SpO2:  91% 92% 92%  Weight:      Height:        Intake/Output Summary (Last 24 hours) at 09/14/2019 1558 Last data filed at 09/14/2019 1200 Gross per 24 hour  Intake 130 ml  Output 2700 ml  Net -2570 ml   Filed Weights   09/07/19 0500 09/09/19 0453  09/10/19 0500  Weight: 118.5 kg 117 kg 116 kg    Examination:  General exam: Appears calm and comfortable, but not responding to verbal stimulation and minimally to tactile stimulation.  Patient has trach. Respiratory system: Decreased air entry globally, inspiratory and expiratory wheeze with transmitted sounds from upper airways.   Cardiovascular system: S1 & S2 heard Gastrointestinal system: Abdomen is obese, soft and nontender.  Organs are difficult to assess.   Central nervous system: Patient is not responsive.   Extremities: Status post right BKA.  No left leg edema    Data Reviewed: I have personally reviewed following labs and imaging studies  CBC: Recent Labs  Lab 09/09/19 0348 09/09/19 1521 09/10/19 0427 09/11/19 0257 09/12/19 0325 09/14/19 0508  WBC 7.3  --  9.2 10.2 9.5 8.8  NEUTROABS  --   --   --   --   --  5.7  HGB 6.8* 8.6* 8.3* 8.9* 9.1* 8.7*  HCT 22.7* 27.0* 26.0* 28.0* 29.8* 29.0*  MCV 114.1*  --  105.3* 106.1* 110.0* 112.0*  PLT 294  --  336 360 365 [id number]   Basic Metabolic Panel: Recent Labs  Lab 09/09/19 0348 09/10/19 0427 09/11/19 0257 09/12/19 0325 09/13/19 0434 09/14/19 0508  NA 146* 145 144 146* 142 144  K 2.8* 3.1* 2.8* 3.5 3.2* 3.6  CL 105 100 96* 97* 95* 101  CO2 30 35* 36* 38* 35* 35*  GLUCOSE 100* 129* 141* 106* 361* 239*  BUN 57* 48* 54* 54* 53* 39*  CREATININE 0.72 0.70 0.70 0.77 0.76 0.55*  CALCIUM 9.8 9.5 10.2 10.7*  10.4* 10.3  MG 2.0 2.0 2.1 2.5*  --  2.4  PHOS 2.9 3.1 2.2* 2.6  --  1.9*   GFR: Estimated Creatinine Clearance: 121.1 mL/min (A) (by C-G formula based on SCr of 0.55 mg/dL (L)). Liver Function Tests: Recent Labs  Lab 09/14/19 0508  ALBUMIN 2.4*   No results for input(s): LIPASE, AMYLASE in the last 168 hours. No results for input(s): AMMONIA in the last 168 hours. Coagulation Profile: No results for input(s): INR, PROTIME in the last 168 hours. Cardiac Enzymes: No results for input(s): CKTOTAL, CKMB,  CKMBINDEX, TROPONINI in the last 168 hours. BNP (last 3 results) No results for input(s): PROBNP in the last 8760 hours. HbA1C: No results for input(s): HGBA1C in the last 72 hours. CBG: Recent Labs  Lab 09/13/19 2018 09/14/19 0010 09/14/19 0413 09/14/19 0824 09/14/19 1135  GLUCAP 303* 221* 250* 251* 378*   Lipid Profile: No results for input(s): CHOL, HDL, LDLCALC, TRIG, CHOLHDL, LDLDIRECT in the last 72 hours. Thyroid Function Tests: No results for input(s): TSH, T4TOTAL, FREET4, T3FREE, THYROIDAB in the last 72 hours. Anemia Panel: No results for input(s): VITAMINB12, FOLATE, FERRITIN, TIBC, IRON, RETICCTPCT in the last 72 hours. Urine analysis:    Component Value Date/Time   COLORURINE YELLOW 09/13/2019 [place] 09/13/2019 1345   LABSPEC 1.016 09/13/2019 1345   PHURINE 6.0 09/13/2019 1345   GLUCOSEU >=500 (A) 09/13/2019 1345   GLUCOSEU 500 03/25/2012 1101   HGBUR NEGATIVE 09/13/2019 1345   BILIRUBINUR NEGATIVE 09/13/2019 1345   BILIRUBINUR negative 10/31/2015 1514   BILIRUBINUR neg 01/19/2013 1125   KETONESUR NEGATIVE 09/13/2019 1345   PROTEINUR NEGATIVE 09/13/2019 1345   UROBILINOGEN >=8.0 10/31/2015 1514   UROBILINOGEN 1.0 03/25/2012 1101   NITRITE NEGATIVE 09/13/2019 1345   LEUKOCYTESUR TRACE (A) 09/13/2019 1345   Sepsis Labs: @LABRCNTIP (procalcitonin:4,lacticidven:4)  ) Recent Results (from the past 240 hour(s))  Culture, blood (routine x 2)     Status: None   Collection Time: 09/08/19 12:12 PM   Specimen: BLOOD LEFT HAND  Result Value Ref Range Status   Specimen Description BLOOD LEFT HAND  Final   Special Requests   Final    BOTTLES DRAWN AEROBIC AND ANAEROBIC Blood Culture adequate volume   Culture   Final    NO GROWTH 5 DAYS Performed at [facility], [place] [address], [place], [place] [address]    Report Status 09/13/2019 FINAL  Final  Culture, blood (routine x 2)     Status: None   Collection Time: 09/08/19 12:20 PM    Specimen: BLOOD RIGHT HAND  Result Value Ref Range Status   Specimen Description BLOOD RIGHT HAND  Final   Special Requests   Final    BOTTLES DRAWN AEROBIC ONLY Blood Culture results may not be optimal due to an inadequate volume of blood received in culture bottles   Culture   Final    NO GROWTH 5 DAYS Performed at [facility], [place] [address] [place], [place] [address]    Report Status 09/13/2019 FINAL  Final  MRSA PCR Screening     Status: None   Collection Time: 09/08/19  1:09 PM   Specimen: Nasal Mucosa; Nasopharyngeal  Result Value Ref Range Status   MRSA by PCR NEGATIVE NEGATIVE Final    Comment:        The GeneXpert MRSA Assay (FDA approved for NASAL specimens only), is one component of a comprehensive MRSA colonization surveillance program. It is not intended  to diagnose MRSA infection nor to guide or monitor treatment for MRSA infections. Performed at [facility], [place] [address] [place], [place] [address]   Culture, respiratory     Status: None   Collection Time: 09/08/19  2:33 PM   Specimen: Tracheal Aspirate  Result Value Ref Range Status   Specimen Description TRACHEAL ASPIRATE  Final   Special Requests NONE  Final   Gram Stain   Final    MODERATE WBC PRESENT, PREDOMINANTLY PMN RARE SQUAMOUS EPITHELIAL CELLS PRESENT ABUNDANT GRAM POSITIVE COCCI MODERATE GRAM NEGATIVE RODS RARE GRAM POSITIVE RODS Performed at [facility], [address] [place], [place] [address]    Culture   Final    MODERATE GROUP B STREP(S.AGALACTIAE)ISOLATED TESTING AGAINST S. AGALACTIAE NOT ROUTINELY PERFORMED DUE TO PREDICTABILITY OF AMP/PEN/VAN SUSCEPTIBILITY. FEW PSEUDOMONAS AERUGINOSA    Report Status 09/12/2019 FINAL  Final   Organism ID, Bacteria PSEUDOMONAS AERUGINOSA  Final      Susceptibility   Pseudomonas aeruginosa - MIC*    CEFTAZIDIME 4 SENSITIVE Sensitive     CIPROFLOXACIN >=4 RESISTANT Resistant     GENTAMICIN 2 SENSITIVE Sensitive      IMIPENEM >=16 RESISTANT Resistant     PIP/TAZO 8 SENSITIVE Sensitive     CEFEPIME 4 SENSITIVE Sensitive     * FEW PSEUDOMONAS AERUGINOSA  Culture, blood (routine x 2)     Status: None (Preliminary result)   Collection Time: 09/13/19 12:47 PM   Specimen: BLOOD  Result Value Ref Range Status   Specimen Description BLOOD RIGHT ANTECUBITAL  Final   Special Requests   Final    BOTTLES DRAWN AEROBIC ONLY Blood Culture results may not be optimal due to an inadequate volume of blood received in culture bottles   Culture   Final    NO GROWTH 1 DAY Performed at [facility], [place] [address] [place], [place] [address]    Report Status PENDING  Incomplete  Culture, blood (routine x 2)     Status: None (Preliminary result)   Collection Time: 09/13/19 12:50 PM   Specimen: BLOOD RIGHT ARM  Result Value Ref Range Status   Specimen Description BLOOD RIGHT ARM  Final   Special Requests   Final    BOTTLES DRAWN AEROBIC ONLY Blood Culture results may not be optimal due to an inadequate volume of blood received in culture bottles   Culture   Final    NO GROWTH 1 DAY Performed at [facility], [place] [address] [place], [place] [address]    Report Status PENDING  Incomplete  Culture, Urine     Status: None   Collection Time: 09/13/19  2:00 PM   Specimen: Urine, Catheterized  Result Value Ref Range Status   Specimen Description URINE, CATHETERIZED  Final   Special Requests NONE  Final   Culture   Final    NO GROWTH Performed at [facility], [address] [place], [place] [address]    Report Status 09/14/2019 FINAL  Final         Radiology Studies: DG CHEST PORT 1 VIEW  Result Date: 09/14/2019 CLINICAL DATA:  Abnormal breathing patterns, check tracheostomy EXAM: PORTABLE CHEST 1 VIEW COMPARISON:  09/11/2019 FINDINGS: Tracheostomy and NG tube remain in place, unchanged. Airspace disease bilaterally, right greater than left. No significant change since prior study.  Cardiomegaly. Left PICC line remains in place, unchanged. IMPRESSION: Support devices including tracheostomy remain in stable position. Bilateral airspace disease, right  greater than left, unchanged since prior study. Electronically Signed   By: Rolm Baptise M.D.   On: 09/14/2019 11:54        Scheduled Meds: . apixaban  5 mg Per Tube BID  . chlorhexidine  15 mL Mouth/Throat BID  . Chlorhexidine Gluconate Cloth  6 each Topical Daily  . collagenase   Topical Daily  . Crystal Light Powder (Potassium Citrate)   1 packet Per Tube Daily  . diltiazem  60 mg Per Tube Q6H  . [START ON 09/15/2019] feeding supplement (PRO-STAT SUGAR FREE 64)  60 mL Per Tube Daily  . folic acid  1 mg Per Tube Daily  . free water  400 mL Per Tube Q4H  . insulin aspart  0-20 Units Subcutaneous Q4H  . insulin glargine  45 Units Subcutaneous BID  . ipratropium-albuterol  3 mL Nebulization Q6H  . mouth rinse  15 mL Mouth Rinse 10 times per day  . Melatonin  3 mg Per Tube QHS  . metoprolol tartrate  100 mg Per Tube BID  . multivitamin  15 mL Per Tube Daily  . nutrition supplement (JUVEN)  1 packet Per Tube BID  . pantoprazole sodium  40 mg Per Tube Daily  . psyllium  1 packet Per Tube BID  . sodium chloride flush  10-40 mL Intracatheter Q12H  . sodium chloride flush  10-40 mL Intracatheter Q12H  . sodium chloride HYPERTONIC  4 mL Nebulization BID  . thiamine  100 mg Per Tube Daily  . vitamin B-12  1,000 mcg Per Tube Daily   Continuous Infusions: . sodium chloride Stopped (08/27/19 1946)  . sodium chloride 10 mL/hr at 09/12/19 2300  . ceFEPime (MAXIPIME) IV 2 g (09/14/19 1400)  . feeding supplement (VITAL AF 1.2 CAL)       LOS: 41 days    Time spent: 52 Minutes  Dana Allan, MD  [facility] Pager #: [phone] 7PM-7AM contact night coverage as above

## 2019-09-14 NOTE — Progress Notes (Signed)
SLP Cancellation Note  Patient Details Name: [name] MRN: [id number] DOB: [birth year]   Cancelled treatment:        Attempted to see pt for swallowing evaluation and PMV assessment.  Pt is back on TC, but is very lethargic and is unable to participate at this time.  SLP will reattempt as schedule permits and pt is medically appropriate.   Celedonio Savage, [place], [place] Office: [phone] 09/14/2019, 10:03 AM
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
## 2019-09-14 NOTE — Progress Notes (Signed)
Physical Therapy Wound Treatment Patient Details  Name: [name] MRN: [id number] Date of Birth: 12/18/1950  Today's Date: 09/14/2019 Time: 9381-8299 Time Calculation (min): 55 min  Subjective  Subjective: Lethargic mostly - restless at times but overall tolerated treatment well.  Patient and Family Stated Goals: No family present during session today.   Pain Score:  Pt not premedicated as he continues to be lethargic. Appeared to tolerate treatment well.   Wound Assessment  Pressure Injury 08/10/19 Sacrum Mid Unstageable - Full thickness tissue loss in which the base of the injury is covered by slough (yellow, tan, gray, green or brown) and/or eschar (tan, brown or black) in the wound bed. wound has evolved into unstageable (Active)  Dressing Type ABD;Gauze (Comment);Moist to dry;Barrier Film (skin prep);Non adherent (Xeroform) 09/14/19 1300  Dressing Clean;Dry;Intact;Changed 09/14/19 1300  Dressing Change Frequency Daily 09/14/19 1300  State of Healing Non-healing 09/14/19 1300  Site / Wound Assessment Pink;Yellow;Brown 09/14/19 1300  % Wound base Red or Granulating 15% 09/14/19 1300  % Wound base Yellow/Fibrinous Exudate 75% 09/14/19 1300  % Wound base Black/Eschar 10% 09/14/19 1300  % Wound base Other/Granulation Tissue (Comment) 0% 09/14/19 1300  Peri-wound Assessment Erythema (non-blanchable);Purple 09/14/19 1300  Wound Length (cm) 5 cm 09/07/19 1400  Wound Width (cm) 8 cm 09/07/19 1400  Wound Depth (cm) 0.1 cm 09/07/19 1400  Wound Surface Area (cm^2) 40 cm^2 09/07/19 1400  Wound Volume (cm^3) 4 cm^3 09/07/19 1400  Margins Unattached edges (unapproximated) 09/14/19 1300  Drainage Amount Minimal 09/14/19 1300  Drainage Description Serosanguineous 09/14/19 1300  Treatment Debridement (Selective);Hydrotherapy (Pulse lavage);Packing (Saline gauze) 09/14/19 1300   Santyl applied to wound bed prior to applying dressing.     Hydrotherapy Pulsed lavage therapy - wound  location: Sacrum Pulsed Lavage with Suction (psi): 12 psi Pulsed Lavage with Suction - Normal Saline Used: 1000 mL Pulsed Lavage Tip: Tip with splash shield Selective Debridement Selective Debridement - Location: Sacrum Selective Debridement - Tools Used: Forceps;Scissors Selective Debridement - Tissue Removed: yellow and brown necrotic tissue   Wound Assessment and Plan  Wound Therapy - Assess/Plan/Recommendations Wound Therapy - Clinical Statement: Inferior peri-wound area appears less macerated this session. Progressing with debridement in sacral wound. This patient will benefit from continued hydrotherapy for selective removal of unviable tissue, to decrease bioburden and promote wound bed healing. Wound Therapy - Functional Problem List: Global weakness in the setting of critical illness.  Factors Delaying/Impairing Wound Healing: Immobility;Multiple medical problems Hydrotherapy Plan: Debridement;Dressing change;Patient/family education;Pulsatile lavage with suction Wound Therapy - Frequency: 6X / week Wound Therapy - Follow Up Recommendations: Skilled nursing facility(vs LTACH) Wound Plan: See above  Wound Therapy Goals- Improve the function of patient's integumentary system by progressing the wound(s) through the phases of wound healing (inflammation - proliferation - remodeling) by: Decrease Necrotic Tissue to: 0 Decrease Necrotic Tissue - Progress: Progressing toward goal Increase Granulation Tissue to: 100 Increase Granulation Tissue - Progress: Progressing toward goal Goals/treatment plan/discharge plan were made with and agreed upon by patient/family: No, Patient unable to participate in goals/treatment/discharge plan and family unavailable Time For Goal Achievement: 7 days Wound Therapy - Potential for Goals: Good  Goals will be updated until maximal potential achieved or discharge criteria met.  Discharge criteria: when goals achieved, discharge from hospital, MD  decision/surgical intervention, no progress towards goals, refusal/missing three consecutive treatments without notification or medical reason.  GP     Thelma Comp 09/14/2019, 1:37 PM   Rolinda Roan, PT, DPT Acute Rehabilitation Services  Pager: [phone] Office: [phone]

## 2019-09-14 NOTE — Progress Notes (Signed)
Inpatient Diabetes Program Recommendations  AACE/ADA: New Consensus Statement on Inpatient Glycemic Control (2015)  Target Ranges:  Prepandial:   less than 140 mg/dL      Peak postprandial:   less than 180 mg/dL (1-2 hours)      Critically ill patients:  140 - 180 mg/dL   Lab Results  Component Value Date   GLUCAP 378 (H) 09/14/2019   HGBA1C 8.2 (H) 08/05/2019    Review of Glycemic Control Results for [name], James L III [name] (MRN [id number]) as of 09/14/2019 15:25  Ref. Range 09/13/2019 20:18 09/14/2019 00:10 09/14/2019 04:13 09/14/2019 08:24 09/14/2019 11:35  Glucose-Capillary Latest Ref Range: 70 - 99 mg/dL 303 (H) 221 (H) 250 (H) 251 (H) 378 (H)   Diabetes history: DM2 Outpatient Diabetes medications: Humulin R U500 0-125 units QID (per Endo note on 05/02/19 taking 125 units with breakfast, 115 units with lunch, 110 units with supper, and variable dose at bedtime) Current orders for Inpatient glycemic control: Levemir 45 units BID, Novolog 9 units Q4H for tube feeding coverage, Novolog 0-20 units Q4H, Tradjenta 5 mg daily Vital 65 cc/ hr  Inpatient Diabetes Program Recommendations:  Blood sugars >goal consider adding Novolog 5 units q 4 hours to cover CHO in tube feeds.   Thanks,  Adah Perl, RN, BC-ADM Inpatient Diabetes Coordinator Pager [phone] (8a-5p)

## 2019-09-15 ENCOUNTER — Inpatient Hospital Stay (HOSPITAL_COMMUNITY): Payer: Medicare Other

## 2019-09-15 DIAGNOSIS — J9610 Chronic respiratory failure, unspecified whether with hypoxia or hypercapnia: Secondary | ICD-10-CM

## 2019-09-15 DIAGNOSIS — I110 Hypertensive heart disease with heart failure: Secondary | ICD-10-CM

## 2019-09-15 DIAGNOSIS — Z7181 Spiritual or religious counseling: Secondary | ICD-10-CM

## 2019-09-15 DIAGNOSIS — L89159 Pressure ulcer of sacral region, unspecified stage: Secondary | ICD-10-CM

## 2019-09-15 DIAGNOSIS — J9601 Acute respiratory failure with hypoxia: Secondary | ICD-10-CM

## 2019-09-15 DIAGNOSIS — Z89519 Acquired absence of unspecified leg below knee: Secondary | ICD-10-CM

## 2019-09-15 DIAGNOSIS — M4628 Osteomyelitis of vertebra, sacral and sacrococcygeal region: Secondary | ICD-10-CM

## 2019-09-15 DIAGNOSIS — I5030 Unspecified diastolic (congestive) heart failure: Secondary | ICD-10-CM

## 2019-09-15 DIAGNOSIS — Z515 Encounter for palliative care: Secondary | ICD-10-CM

## 2019-09-15 LAB — GLUCOSE, CAPILLARY
Glucose-Capillary: 113 mg/dL — ABNORMAL HIGH (ref 70–99)
Glucose-Capillary: 166 mg/dL — ABNORMAL HIGH (ref 70–99)
Glucose-Capillary: 194 mg/dL — ABNORMAL HIGH (ref 70–99)
Glucose-Capillary: 233 mg/dL — ABNORMAL HIGH (ref 70–99)
Glucose-Capillary: 242 mg/dL — ABNORMAL HIGH (ref 70–99)
Glucose-Capillary: 272 mg/dL — ABNORMAL HIGH (ref 70–99)
Glucose-Capillary: 343 mg/dL — ABNORMAL HIGH (ref 70–99)

## 2019-09-15 MED ORDER — ARTIFICIAL TEARS OPHTHALMIC OINT
TOPICAL_OINTMENT | Freq: Three times a day (TID) | OPHTHALMIC | Status: DC
  Filled 2019-09-15: qty 3.5

## 2019-09-15 MED ORDER — IOHEXOL 300 MG/ML  SOLN
100.0000 mL | Freq: Once | INTRAMUSCULAR | Status: AC | PRN
  Administered 2019-09-15: 100 mL via INTRAVENOUS

## 2019-09-15 MED ORDER — INSULIN ASPART 100 UNIT/ML ~~LOC~~ SOLN
5.0000 [IU] | SUBCUTANEOUS | Status: DC
  Administered 2019-09-15 – 2019-09-18 (×18): 5 [IU] via SUBCUTANEOUS

## 2019-09-15 MED ORDER — VANCOMYCIN HCL 1500 MG/300ML IV SOLN
1500.0000 mg | Freq: Two times a day (BID) | INTRAVENOUS | Status: DC
  Administered 2019-09-15 – 2019-09-16 (×2): 1500 mg via INTRAVENOUS
  Filled 2019-09-15 (×3): qty 300

## 2019-09-15 MED ORDER — DILTIAZEM 12 MG/ML ORAL SUSPENSION
90.0000 mg | Freq: Four times a day (QID) | ORAL | Status: DC
  Administered 2019-09-15 – 2019-09-19 (×14): 90 mg
  Filled 2019-09-15 (×19): qty 9

## 2019-09-15 NOTE — Progress Notes (Signed)
SLP Cancellation Note  Patient Details Name: [name] MRN: [id number] DOB: [birth year]   Cancelled treatment:       Reason Eval/Treat Not Completed: Patient's level of consciousness; obtunded.  SLP will follow for readiness for PMV and swallow evaluations.   [name] L. Tivis Ringer, [place] Office number [phone] Pager [phone]    [name] 09/15/2019, 2:58 PM

## 2019-09-15 NOTE — Progress Notes (Signed)
Inpatient Diabetes Program Recommendations  AACE/ADA: New Consensus Statement on Inpatient Glycemic Control (2015)  Target Ranges:  Prepandial:   less than 140 mg/dL      Peak postprandial:   less than 180 mg/dL (1-2 hours)      Critically ill patients:  140 - 180 mg/dL   Results for Cadotte, Mathieu L III [name] (MRN [id number]) as of 09/15/2019 10:00  Ref. Range 09/14/2019 00:10 09/14/2019 04:13 09/14/2019 08:24 09/14/2019 11:35 09/14/2019 17:55 09/14/2019 20:14  Glucose-Capillary Latest Ref Range: 70 - 99 mg/dL 221 (H)  7 units NOVOLOG  250 (H)  7 units NOVOLOG  251 (H)  11 units NOVOLOG  378 (H)  20 units NOVOLOG +  45 units LANTUS  193 (H)  4 units NOVOLOG  222 (H)  7 units NOVOLOG +  45 units LANTUS    Results for Paschen, Braven L III [name] (MRN [id number]) as of 09/15/2019 10:00  Ref. Range 09/15/2019 00:03 09/15/2019 03:40 09/15/2019 08:36  Glucose-Capillary Latest Ref Range: 70 - 99 mg/dL 272 (H)  11 units NOVOLOG  233 (H)  7 units NOVOLOG  242 (H)  7 units NOVOLOG      Home DM Meds: Humulin R U500 0-125 units QID (per Endo note on 05/02/19 taking 125 units with breakfast, 115 units with lunch, 110 units with supper, and variable dose at bedtime)   Current Orders: Lantus 45 units BID      Novolog Resistant Correction Scale/ SSI (0-20 units) Q4 hours     Getting tube feeds 75cc/hr.  Note Lantus increased to 45 units BID on PM of 12/29.     MD- Please consider adding Novolog tube feed coverage:  Novolog 6 units Q4 hours  HOLD if tube feeds HELD for any reason     --Will follow patient during hospitalization--  Wyn Quaker RN, MSN, CDE Diabetes Coordinator Inpatient Glycemic Control Team Team Pager: [phone] (8a-5p)
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
## 2019-09-15 NOTE — Progress Notes (Signed)
PROGRESS NOTE    [name]  [name] DOB: 11/22/1950 DOA: 08/10/2019 PCP: McLean-Scocuzza, Nino Glow, MD  Outpatient Specialists:   Brief Narrative:  Patient is a 68 year old male with past medical history significant for diabetes mellitus type 2 on insulin, status post BKA, nephrolithiasis, PVD, hypertension, hyperlipidemia, Pseudomonas pneumonia, history of MRSA, gastroparesis, hyperparathyroidism, fatty liver, esophageal stricture, diastolic CHF, severe COPD, carotid artery stenosis, BPH, fatty liver and gastroparesis amongst other medical and cardiac problems.  Patient also has extensive surgical history.  Patient was admitted with Covid pneumonia on 07/17/2019, after having tested positive on 07/27/2019.  Patient was intubated 08/05/2019, was eventually transferred to [place] on 08/06/2019.  Patient was transferred to [facility] on 08/18/2019, was trached on 08/20/2019.  Hospitalist team assumed patient's care on 09/13/2019.  Patient remains critically ill, continues to have fever and not very responsive.  Per prior documentation, patient has been on multiple antibiotics: "Decadron 11/19 >11/29 Remdesivir  11/19 > 11/23  Ceftriaxone 11/19 > 11/23 Azithro 11/19 > 11/23  Pip-tazo 11/26  > 11/28 Levofloxacin 11/26 > 11/28  Aztreonam 11/28 >  12/21 Ceftaz-avibactam 11/28 >  12/12  Zosyn 12/24>>>  Vancomycin 12/24>>>"  IV Zosyn was changed to IV cefepime on 09/13/2019.  As per prior documentation, resolved hospital problems include: "DKA HCAP- s/p 14 day course of avycaz, azabactam finished 08/27/19 ARDS secondary to COVID pneumonia "  09/13/2019: We will change IV Zosyn to IV cefepime.  We will panculture the patient.  Blood sugar is poorly controlled.  BUN is elevated.  Patient is on tube feeds.  Patient has significant sacral decubitus ulcer, patient had wound VAC to the sacral decubitus ulcer but the wound VAC is currently on hold.  Guarded  prognosis.  Patient stable to give any history.  09/14/2019: Patient seen alongside patient's nurse.  No significant change overnight.  Patient continues to have fever.  Thick, nasty looking tracheal secretions reported.  We will send tracheal aspirate for cultures.  We will proceed with wound culture.  Hypertonic saline nebulizer.  Low threshold to consult infectious disease team.  09/15/2019: Patient seen.  No new changes.  Fever persists.  T-max is 103.1 F.  Discussed with infectious disease team, Dr. Dietrich [place], and Dr. Tommy Medal has kindly agreed to see patient in consultation.  CT chest, abdomen and pelvis revealed bilateral pleural effusion, small on the right and moderate on the left, with consolidative changes mainly on the right lung field.  We will proceed with thoracentesis and pleural fluid analysis by the interventional radiology team.  Will await input from the infectious disease team.  Prognosis remains guarded.  Further management will depend on hospital course.  Assessment & Plan:   Principal Problem:   Pneumonia due to COVID-19 virus Active Problems:   ANEMIA, IRON DEFICIENCY   CAD (coronary artery disease)   COPD, severe (HCC)   Gastroesophageal reflux disease with hiatal hernia   Diabetes mellitus type 2 in obese ([place])   COVID-19   ARDS (adult respiratory distress syndrome) (HCC)   Pressure injury of skin   Acute respiratory failure (HCC)   Acute encephalopathy   Fever: -Panculture patient (blood culture, urinalysis and urine culture from Foley catheter and wound culture). -Tracheal aspirate grew Pseudomonas.  Change Zosyn to cefepime. -Check procalcitonin level. -Further management depend on hospital course. 09/14/2019: Patient seen.  No new changes.  Fever continues.  Work-up as documented above.  Will have low threshold to consult infectious disease team.  09/15/2019: See above.  CT abdomen, chest and pelvis done.  Will proceed with thoracentesis and pleural fluid  analysis.  Infectious disease team consulted.  Will await input from infectious disease team.  Diabetes mellitus, uncontrolled: -Patient is on subcutaneous Lantus insulin 30 units twice daily. -Patient is also on sliding scale insulin coverage every 4 hours. -Blood sugar is currently running in the upper 300s. -Increase subcutaneous Lantus to 45 units twice daily. -Consult podiatry team to review tube feeds. 09/14/2019: Some improvement in blood sugar control.  Continue to optimize. 09/15/2019: Will add subcutaneous Novolin 5 units with tube feeds every 4 hours.  Blood sugar remains uncontrolled.  Sacral decubitus ulcer: Likely pressure ulcers Wound care and PT is appreciated Follow wound cultures Wound VAC is currently on hold.  Acute respiratory failure: -Recent Covid -Initially intubated, now on trach -Checks x-ray series revealing multiple quadrant infiltrates -Start DuoNeb 09/14/2019: Improved air entry.  Continue nebs.    Acute encephalopathy: -Patient remains encephalopathic. -No response from patient during this consultation. 09/14/2019: Patient is more responsive today, but is only intermittent..  Tracheitis/possible pneumonia secondary to Pseudomonas aeruginosa: -Tracheal aspirate grew Pseudomonas aeruginosa. -IV Zosyn has been changed to IV cefepime. -Patient continues to have fever. 09/14/2019: Continue IV cefepime.  Hypertension: Continue to optimize.  Hypernatremia: Resolved.  Atrial fibrillation: Rate controlled. Continue Eliquis. 09/14/2019: Heart rate has ranged from 96 to 130 bpm.  Change nebs albuterol to Xopenex. 09/15/2019: Last documented heart rate was 117 bpm.  Increase Cardizem from 60 mg every 6 hourly to 90 mg every 6 hourly via PEG tube.  Optimize rate control.  Guarded prognosis.  DVT prophylaxis: Apixaban Code Status: Full code.  Will consult palliative care team Family Communication:  Disposition Plan: This will depend on hospital  course   Consultants:   Patient was transferred from ICU team.  Procedures:   Status post trach  Antimicrobials:   Discontinue Zosyn today, 09/13/2019.    Started cefepime on 09/13/2019.    Subjective: No history from patient T-max of 103.1 Heart rate uncontrolled  Objective: Vitals:   09/15/19 0833 09/15/19 1137 09/15/19 1157 09/15/19 1500  BP: (!) 133/55 (!) 128/93  (!) 145/51  Pulse:  (!) 114 (!) 116   Resp:  (!) 28 (!) 29   Temp: (!) 100.9 F (38.3 C) (!) 102.6 F (39.2 C)  (!) 101 F (38.3 C)  TempSrc: Oral Axillary  Oral  SpO2:  100% 97%   Weight:      Height:        Intake/Output Summary (Last 24 hours) at 09/15/2019 1535 Last data filed at 09/15/2019 1316 Gross per 24 hour  Intake 1500 ml  Output 3200 ml  Net -1700 ml   Filed Weights   09/09/19 0453 09/10/19 0500 09/15/19 0500  Weight: 117 kg 116 kg 103 kg    Examination:  General exam: Appears calm and comfortable, but not responding to verbal stimulation and minimally to tactile stimulation.  Patient has trach. Respiratory system: Adequate air entry with transmitted sounds from upper airways.   Cardiovascular system: S1 & S2 heard, irregular and tachycardic Gastrointestinal system: Abdomen is obese, soft and nontender.  Organs are difficult to assess.   Central nervous system: Patient is not responsive.   Extremities: Status post right BKA.  No left leg edema    Data Reviewed: I have personally reviewed following labs and imaging studies  CBC: Recent Labs  Lab 09/09/19 0348 09/09/19 1521 09/10/19 0427 09/11/19 0257 09/12/19 0325 09/14/19 [id number]  WBC 7.3  --  9.2 10.2 9.5 8.8  NEUTROABS  --   --   --   --   --  5.7  HGB 6.8* 8.6* 8.3* 8.9* 9.1* 8.7*  HCT 22.7* 27.0* 26.0* 28.0* 29.8* 29.0*  MCV 114.1*  --  105.3* 106.1* 110.0* 112.0*  PLT 294  --  336 360 365 [id number]   Basic Metabolic Panel: Recent Labs  Lab 09/09/19 0348 09/10/19 0427 09/11/19 0257 09/12/19 0325 09/13/19 0434  09/14/19 0508  NA 146* 145 144 146* 142 144  K 2.8* 3.1* 2.8* 3.5 3.2* 3.6  CL 105 100 96* 97* 95* 101  CO2 30 35* 36* 38* 35* 35*  GLUCOSE 100* 129* 141* 106* 361* 239*  BUN 57* 48* 54* 54* 53* 39*  CREATININE 0.72 0.70 0.70 0.77 0.76 0.55*  CALCIUM 9.8 9.5 10.2 10.7* 10.4* 10.3  MG 2.0 2.0 2.1 2.5*  --  2.4  PHOS 2.9 3.1 2.2* 2.6  --  1.9*   GFR: Estimated Creatinine Clearance: 114.5 mL/min (A) (by C-G formula based on SCr of 0.55 mg/dL (L)). Liver Function Tests: Recent Labs  Lab 09/14/19 0508  ALBUMIN 2.4*   No results for input(s): LIPASE, AMYLASE in the last 168 hours. No results for input(s): AMMONIA in the last 168 hours. Coagulation Profile: No results for input(s): INR, PROTIME in the last 168 hours. Cardiac Enzymes: No results for input(s): CKTOTAL, CKMB, CKMBINDEX, TROPONINI in the last 168 hours. BNP (last 3 results) No results for input(s): PROBNP in the last 8760 hours. HbA1C: No results for input(s): HGBA1C in the last 72 hours. CBG: Recent Labs  Lab 09/14/19 2014 09/15/19 0003 09/15/19 0340 09/15/19 0836 09/15/19 1147  GLUCAP 222* 272* 233* 242* 343*   Lipid Profile: No results for input(s): CHOL, HDL, LDLCALC, TRIG, CHOLHDL, LDLDIRECT in the last 72 hours. Thyroid Function Tests: No results for input(s): TSH, T4TOTAL, FREET4, T3FREE, THYROIDAB in the last 72 hours. Anemia Panel: No results for input(s): VITAMINB12, FOLATE, FERRITIN, TIBC, IRON, RETICCTPCT in the last 72 hours. Urine analysis:    Component Value Date/Time   COLORURINE YELLOW 09/13/2019 [place] 09/13/2019 1345   LABSPEC 1.016 09/13/2019 1345   PHURINE 6.0 09/13/2019 1345   GLUCOSEU >=500 (A) 09/13/2019 1345   GLUCOSEU 500 03/25/2012 1101   HGBUR NEGATIVE 09/13/2019 1345   BILIRUBINUR NEGATIVE 09/13/2019 1345   BILIRUBINUR negative 10/31/2015 1514   BILIRUBINUR neg 01/19/2013 1125   KETONESUR NEGATIVE 09/13/2019 1345   PROTEINUR NEGATIVE 09/13/2019 1345    UROBILINOGEN >=8.0 10/31/2015 1514   UROBILINOGEN 1.0 03/25/2012 1101   NITRITE NEGATIVE 09/13/2019 1345   LEUKOCYTESUR TRACE (A) 09/13/2019 1345   Sepsis Labs: @LABRCNTIP (procalcitonin:4,lacticidven:4)  ) Recent Results (from the past 240 hour(s))  Culture, blood (routine x 2)     Status: None   Collection Time: 09/08/19 12:12 PM   Specimen: BLOOD LEFT HAND  Result Value Ref Range Status   Specimen Description BLOOD LEFT HAND  Final   Special Requests   Final    BOTTLES DRAWN AEROBIC AND ANAEROBIC Blood Culture adequate volume   Culture   Final    NO GROWTH 5 DAYS Performed at [facility], [place] [address] [place], [place] [address]    Report Status 09/13/2019 FINAL  Final  Culture, blood (routine x 2)     Status: None   Collection Time: 09/08/19 12:20 PM   Specimen: BLOOD RIGHT HAND  Result Value Ref Range Status   Specimen  Description BLOOD RIGHT HAND  Final   Special Requests   Final    BOTTLES DRAWN AEROBIC ONLY Blood Culture results may not be optimal due to an inadequate volume of blood received in culture bottles   Culture   Final    NO GROWTH 5 DAYS Performed at [facility], [place] [address] [place], [place] [address]    Report Status 09/13/2019 FINAL  Final  MRSA PCR Screening     Status: None   Collection Time: 09/08/19  1:09 PM   Specimen: Nasal Mucosa; Nasopharyngeal  Result Value Ref Range Status   MRSA by PCR NEGATIVE NEGATIVE Final    Comment:        The GeneXpert MRSA Assay (FDA approved for NASAL specimens only), is one component of a comprehensive MRSA colonization surveillance program. It is not intended to diagnose MRSA infection nor to guide or monitor treatment for MRSA infections. Performed at [facility], [place] [address] [place], [place] [address]   Culture, respiratory     Status: None   Collection Time: 09/08/19  2:33 PM   Specimen: Tracheal Aspirate  Result Value Ref Range Status   Specimen Description TRACHEAL  ASPIRATE  Final   Special Requests NONE  Final   Gram Stain   Final    MODERATE WBC PRESENT, PREDOMINANTLY PMN RARE SQUAMOUS EPITHELIAL CELLS PRESENT ABUNDANT GRAM POSITIVE COCCI MODERATE GRAM NEGATIVE RODS RARE GRAM POSITIVE RODS Performed at [facility], [address] [place], [place] [address]    Culture   Final    MODERATE GROUP B STREP(S.AGALACTIAE)ISOLATED TESTING AGAINST S. AGALACTIAE NOT ROUTINELY PERFORMED DUE TO PREDICTABILITY OF AMP/PEN/VAN SUSCEPTIBILITY. FEW PSEUDOMONAS AERUGINOSA    Report Status 09/12/2019 FINAL  Final   Organism ID, Bacteria PSEUDOMONAS AERUGINOSA  Final      Susceptibility   Pseudomonas aeruginosa - MIC*    CEFTAZIDIME 4 SENSITIVE Sensitive     CIPROFLOXACIN >=4 RESISTANT Resistant     GENTAMICIN 2 SENSITIVE Sensitive     IMIPENEM >=16 RESISTANT Resistant     PIP/TAZO 8 SENSITIVE Sensitive     CEFEPIME 4 SENSITIVE Sensitive     * FEW PSEUDOMONAS AERUGINOSA  Culture, blood (routine x 2)     Status: None (Preliminary result)   Collection Time: 09/13/19 12:47 PM   Specimen: BLOOD  Result Value Ref Range Status   Specimen Description BLOOD RIGHT ANTECUBITAL  Final   Special Requests   Final    BOTTLES DRAWN AEROBIC ONLY Blood Culture results may not be optimal due to an inadequate volume of blood received in culture bottles   Culture   Final    NO GROWTH 2 DAYS Performed at [facility], [place] [address] [place], [place] [address]    Report Status PENDING  Incomplete  Culture, blood (routine x 2)     Status: None (Preliminary result)   Collection Time: 09/13/19 12:50 PM   Specimen: BLOOD RIGHT ARM  Result Value Ref Range Status   Specimen Description BLOOD RIGHT ARM  Final   Special Requests   Final    BOTTLES DRAWN AEROBIC ONLY Blood Culture results may not be optimal due to an inadequate volume of blood received in culture bottles   Culture   Final    NO GROWTH 2 DAYS Performed at [facility], [place] [address]  [place], [place] [address]    Report Status PENDING  Incomplete  Culture, Urine     Status:  None   Collection Time: 09/13/19  2:00 PM   Specimen: Urine, Catheterized  Result Value Ref Range Status   Specimen Description URINE, CATHETERIZED  Final   Special Requests NONE  Final   Culture   Final    NO GROWTH Performed at [facility], [address], [place], [place] [address]    Report Status 09/14/2019 FINAL  Final  Culture, respiratory     Status: None (Preliminary result)   Collection Time: 09/14/19  2:46 PM   Specimen: Tracheal Aspirate  Result Value Ref Range Status   Specimen Description TRACHEAL ASPIRATE  Final   Special Requests NONE  Final   Gram Stain   Final    ABUNDANT WBC PRESENT,BOTH PMN AND MONONUCLEAR FEW GRAM POSITIVE COCCI MODERATE YEAST    Culture   Final    CULTURE REINCUBATED FOR BETTER GROWTH Performed at [facility], [place] [address], [place], [place] [address]    Report Status PENDING  Incomplete  Aerobic Culture (superficial specimen)     Status: None (Preliminary result)   Collection Time: 09/15/19 11:22 AM   Specimen: Wound  Result Value Ref Range Status   Specimen Description WOUND SACRAL  Final   Special Requests Normal  Final   Gram Stain   Final    NO WBC SEEN MODERATE GRAM NEGATIVE RODS RARE GRAM POSITIVE COCCI Performed at [facility], [address] [place], [place] [address]    Culture PENDING  Incomplete   Report Status PENDING  Incomplete         Radiology Studies: CT CHEST W CONTRAST  Result Date: 09/15/2019 CLINICAL DATA:  Fever of unknown origin, COVID pneumonia 07/2019 EXAM: CT CHEST, ABDOMEN, AND PELVIS WITH CONTRAST TECHNIQUE: Multidetector CT imaging of the chest, abdomen and pelvis was performed following the standard protocol during bolus administration of intravenous contrast. CONTRAST:  144mL OMNIPAQUE IOHEXOL 300 MG/ML SOLN, additional enteric contrast COMPARISON:  CT abdomen pelvis, 12/07/2018,  11/05/2015 FINDINGS: CT CHEST FINDINGS Cardiovascular: No significant vascular findings. Normal heart size. Three-vessel coronary artery calcifications. No pericardial effusion. Mediastinum/Nodes: No enlarged mediastinal, hilar, or axillary lymph nodes. Thyroid is normal. Tracheostomy. Status post pull-through esophagectomy. Lungs/Pleura: Moderate left, small right pleural effusion with associated atelectasis or consolidation. There is extensive intermixed heterogeneous airspace disease and consolidation of the bilateral lungs with some degree of underlying scarring, particularly in the right lung, some of which is postoperative following thoracotomy and esophagectomy. Small, benign pulmonary nodule noted in the dependent right lung (series 5, image 109). Musculoskeletal: No chest wall mass or suspicious bone lesions identified. Multiple chronic fracture deformities of the right ribs and bridging callus of multiple levels. CT ABDOMEN PELVIS FINDINGS Hepatobiliary: No solid liver abnormality is seen. Sludge and/or tiny calcified gallstones in the gallbladder. No gallbladder wall thickening, or biliary dilatation. Pancreas: Unremarkable. No pancreatic ductal dilatation or surrounding inflammatory changes. Spleen: Normal in size without significant abnormality. Adrenals/Urinary Tract: Adrenal glands are unremarkable. Small nonobstructive bilateral renal calculi. Foley catheter in the urinary bladder. The bladder is mildly thickened. Stomach/Bowel: Status post pull-through esophagectomy. Appendix is surgically absent. No evidence of bowel wall thickening, distention, or inflammatory changes. Rectal tube. Vascular/Lymphatic: Aortic atherosclerosis. No enlarged abdominal or pelvic lymph nodes. Reproductive: Mild prostatomegaly. Other: No abdominal wall hernia or abnormality. No abdominopelvic ascites. Musculoskeletal: Sacral decubitus ulcer open to bone (series 3, image 120). IMPRESSION: 1. Extensive intermixed  heterogeneous airspace disease and consolidation of the bilateral lungs with some degree of underlying scarring,  particularly in the right lung, some of which is postoperative following esophagectomy and pull-through. Findings are consistent with ongoing multifocal infection. 2. Moderate left, small right pleural effusions with associated atelectasis or consolidation. 3. Tracheostomy. 4. Sacral decubitus ulcer open to bone (series 3, image 120). MRI could be used to assess for bone marrow edema and osteomyelitis if desired. 5. Thickening of the urinary bladder, decompressed by Foley catheter, and likely due to chronic outlet obstruction. Correlate with urinalysis. 6. Sludge and/or tiny calcified gallstones in the gallbladder without CT findings to suggest acute cholecystitis. 7. Nonobstructive bilateral nephrolithiasis. 8. Status post gastric pull-through esophagectomy. 9. Coronary artery disease.  Aortic Atherosclerosis (ICD10-I70.0). Electronically Signed   By: Eddie Candle M.D.   On: 09/15/2019 15:18   CT ABDOMEN PELVIS W CONTRAST  Result Date: 09/15/2019 CLINICAL DATA:  Fever of unknown origin, COVID pneumonia 07/2019 EXAM: CT CHEST, ABDOMEN, AND PELVIS WITH CONTRAST TECHNIQUE: Multidetector CT imaging of the chest, abdomen and pelvis was performed following the standard protocol during bolus administration of intravenous contrast. CONTRAST:  18mL OMNIPAQUE IOHEXOL 300 MG/ML SOLN, additional enteric contrast COMPARISON:  CT abdomen pelvis, 12/07/2018, 11/05/2015 FINDINGS: CT CHEST FINDINGS Cardiovascular: No significant vascular findings. Normal heart size. Three-vessel coronary artery calcifications. No pericardial effusion. Mediastinum/Nodes: No enlarged mediastinal, hilar, or axillary lymph nodes. Thyroid is normal. Tracheostomy. Status post pull-through esophagectomy. Lungs/Pleura: Moderate left, small right pleural effusion with associated atelectasis or consolidation. There is extensive intermixed  heterogeneous airspace disease and consolidation of the bilateral lungs with some degree of underlying scarring, particularly in the right lung, some of which is postoperative following thoracotomy and esophagectomy. Small, benign pulmonary nodule noted in the dependent right lung (series 5, image 109). Musculoskeletal: No chest wall mass or suspicious bone lesions identified. Multiple chronic fracture deformities of the right ribs and bridging callus of multiple levels. CT ABDOMEN PELVIS FINDINGS Hepatobiliary: No solid liver abnormality is seen. Sludge and/or tiny calcified gallstones in the gallbladder. No gallbladder wall thickening, or biliary dilatation. Pancreas: Unremarkable. No pancreatic ductal dilatation or surrounding inflammatory changes. Spleen: Normal in size without significant abnormality. Adrenals/Urinary Tract: Adrenal glands are unremarkable. Small nonobstructive bilateral renal calculi. Foley catheter in the urinary bladder. The bladder is mildly thickened. Stomach/Bowel: Status post pull-through esophagectomy. Appendix is surgically absent. No evidence of bowel wall thickening, distention, or inflammatory changes. Rectal tube. Vascular/Lymphatic: Aortic atherosclerosis. No enlarged abdominal or pelvic lymph nodes. Reproductive: Mild prostatomegaly. Other: No abdominal wall hernia or abnormality. No abdominopelvic ascites. Musculoskeletal: Sacral decubitus ulcer open to bone (series 3, image 120). IMPRESSION: 1. Extensive intermixed heterogeneous airspace disease and consolidation of the bilateral lungs with some degree of underlying scarring, particularly in the right lung, some of which is postoperative following esophagectomy and pull-through. Findings are consistent with ongoing multifocal infection. 2. Moderate left, small right pleural effusions with associated atelectasis or consolidation. 3. Tracheostomy. 4. Sacral decubitus ulcer open to bone (series 3, image 120). MRI could be used to  assess for bone marrow edema and osteomyelitis if desired. 5. Thickening of the urinary bladder, decompressed by Foley catheter, and likely due to chronic outlet obstruction. Correlate with urinalysis. 6. Sludge and/or tiny calcified gallstones in the gallbladder without CT findings to suggest acute cholecystitis. 7. Nonobstructive bilateral nephrolithiasis. 8. Status post gastric pull-through esophagectomy. 9. Coronary artery disease.  Aortic Atherosclerosis (ICD10-I70.0). Electronically Signed   By: Eddie Candle M.D.   On: 09/15/2019 15:18   DG CHEST PORT 1 VIEW  Result Date: 09/14/2019 CLINICAL DATA:  Abnormal  breathing patterns, check tracheostomy EXAM: PORTABLE CHEST 1 VIEW COMPARISON:  09/11/2019 FINDINGS: Tracheostomy and NG tube remain in place, unchanged. Airspace disease bilaterally, right greater than left. No significant change since prior study. Cardiomegaly. Left PICC line remains in place, unchanged. IMPRESSION: Support devices including tracheostomy remain in stable position. Bilateral airspace disease, right greater than left, unchanged since prior study. Electronically Signed   By: Rolm Baptise M.D.   On: 09/14/2019 11:54        Scheduled Meds: . apixaban  5 mg Per Tube BID  . chlorhexidine  15 mL Mouth/Throat BID  . Chlorhexidine Gluconate Cloth  6 each Topical Daily  . collagenase   Topical Daily  . Crystal Light Powder (Potassium Citrate)   1 packet Per Tube Daily  . diltiazem  60 mg Per Tube Q6H  . feeding supplement (PRO-STAT SUGAR FREE 64)  60 mL Per Tube Daily  . folic acid  1 mg Per Tube Daily  . free water  400 mL Per Tube Q4H  . insulin aspart  0-20 Units Subcutaneous Q4H  . insulin aspart  5 Units Subcutaneous Q4H  . insulin glargine  45 Units Subcutaneous BID  . ipratropium  0.5 mg Nebulization Q6H  . levalbuterol  0.63 mg Nebulization Q6H  . mouth rinse  15 mL Mouth Rinse 10 times per day  . Melatonin  3 mg Per Tube QHS  . metoprolol tartrate  100 mg Per  Tube BID  . multivitamin  15 mL Per Tube Daily  . nutrition supplement (JUVEN)  1 packet Per Tube BID  . pantoprazole sodium  40 mg Per Tube Daily  . psyllium  1 packet Per Tube BID  . sodium chloride flush  10-40 mL Intracatheter Q12H  . sodium chloride flush  10-40 mL Intracatheter Q12H  . sodium chloride HYPERTONIC  4 mL Nebulization BID  . thiamine  100 mg Per Tube Daily  . vitamin B-12  1,000 mcg Per Tube Daily   Continuous Infusions: . sodium chloride Stopped (08/27/19 1946)  . sodium chloride 10 mL/hr at 09/12/19 2300  . ceFEPime (MAXIPIME) IV 2 g (09/15/19 1316)  . feeding supplement (VITAL AF 1.2 CAL) 1,000 mL (09/15/19 0649)     LOS: 42 days    Time spent: [place]  Dana Allan, MD  [facility] Pager #: [phone] 7PM-7AM contact night coverage as above

## 2019-09-15 NOTE — Progress Notes (Signed)
Physical Therapy Wound Treatment Patient Details  Name: [name] MRN: [id number] Date of Birth: 1951-01-23  Today's Date: 09/15/2019 Time: 1030-1130 Time Calculation (min): 60 min  Subjective  Subjective: Lethargic mostly - restless at times but overall tolerated treatment well.  Patient and Family Stated Goals: No family present during session today.   Pain Score:  Restless during treatment but appeared to tolerate well. Eyes closed for most of session.  Wound Assessment  Pressure Injury 08/10/19 Sacrum Mid Unstageable - Full thickness tissue loss in which the base of the injury is covered by slough (yellow, tan, gray, green or brown) and/or eschar (tan, brown or black) in the wound bed. wound has evolved into unstageable (Active)  Dressing Type ABD;Barrier Film (skin prep);Gauze (Comment);Moist to dry;Non adherent 09/15/19 1300  Dressing Clean;Dry;Intact;Changed 09/15/19 1300  Dressing Change Frequency Daily 09/15/19 1300  State of Healing Non-healing 09/15/19 1300  Site / Wound Assessment Pink;Yellow;Brown 09/15/19 1300  % Wound base Red or Granulating 15% 09/15/19 1300  % Wound base Yellow/Fibrinous Exudate 60% 09/15/19 1300  % Wound base Black/Eschar 25% 09/15/19 1300  % Wound base Other/Granulation Tissue (Comment) 0% 09/15/19 1300  Peri-wound Assessment Erythema (non-blanchable);Purple 09/15/19 1300  Wound Length (cm) 5 cm 09/07/19 1400  Wound Width (cm) 8 cm 09/07/19 1400  Wound Depth (cm) 0.1 cm 09/07/19 1400  Wound Surface Area (cm^2) 40 cm^2 09/07/19 1400  Wound Volume (cm^3) 4 cm^3 09/07/19 1400  Margins Unattached edges (unapproximated) 09/15/19 1300  Drainage Amount Minimal 09/15/19 1300  Drainage Description Serosanguineous 09/15/19 1300  Treatment Debridement (Selective);Hydrotherapy (Pulse lavage);Packing (Saline gauze) 09/15/19 1300   Santyl applied to wound bed prior to applying dressing.     Hydrotherapy Pulsed lavage therapy - wound location:  Sacrum Pulsed Lavage with Suction (psi): 12 psi Pulsed Lavage with Suction - Normal Saline Used: 1000 mL Pulsed Lavage Tip: Tip with splash shield Selective Debridement Selective Debridement - Location: Sacrum Selective Debridement - Tools Used: Forceps;Scissors Selective Debridement - Tissue Removed: yellow and brown necrotic tissue   Wound Assessment and Plan  Wound Therapy - Assess/Plan/Recommendations Wound Therapy - Clinical Statement: Inferior peri-wound area appears less macerated this session. Progressing with debridement in sacral wound. This patient will benefit from continued hydrotherapy for selective removal of unviable tissue, to decrease bioburden and promote wound bed healing. Wound Therapy - Functional Problem List: Global weakness in the setting of critical illness.  Factors Delaying/Impairing Wound Healing: Immobility;Multiple medical problems Hydrotherapy Plan: Debridement;Dressing change;Patient/family education;Pulsatile lavage with suction Wound Therapy - Frequency: 6X / week Wound Therapy - Follow Up Recommendations: Skilled nursing facility(vs LTACH) Wound Plan: See above  Wound Therapy Goals- Improve the function of patient's integumentary system by progressing the wound(s) through the phases of wound healing (inflammation - proliferation - remodeling) by: Decrease Necrotic Tissue to: 0 Decrease Necrotic Tissue - Progress: Progressing toward goal Increase Granulation Tissue to: 100 Increase Granulation Tissue - Progress: Progressing toward goal Goals/treatment plan/discharge plan were made with and agreed upon by patient/family: No, Patient unable to participate in goals/treatment/discharge plan and family unavailable Time For Goal Achievement: 7 days Wound Therapy - Potential for Goals: Good  Goals will be updated until maximal potential achieved or discharge criteria met.  Discharge criteria: when goals achieved, discharge from hospital, MD decision/surgical  intervention, no progress towards goals, refusal/missing three consecutive treatments without notification or medical reason.  GP     Thelma Comp 09/15/2019, 1:24 PM   Rolinda Roan, PT, DPT Acute Rehabilitation Services Pager: [phone]  Office: [phone]

## 2019-09-15 NOTE — Progress Notes (Signed)
Pharmacy Antibiotic Note  [name] is a 68 y.o. male admitted on 08/05/2019.  Currently with Multifocal VAP, Chronic respiratory failure post COVID and Sacral osteomyelitis.  Pharmacy has been consulted for vancomycin dosing.  Scr stable.  Plan: Vancomycin 1500 mg q 12 hrs. F/u cultures, renal function and clinical course.   Height: 6\' 1"  (185.4 cm) Weight: 227 lb (103 kg) IBW/kg (Calculated) : 79.9  Temp (24hrs), Avg:101.6 F (38.7 C), Min:100.7 F (38.2 C), Max:103.1 F (39.5 C)  Recent Labs  Lab 09/09/19 0348 09/10/19 0427 09/11/19 0257 09/12/19 0325 09/13/19 0434 09/14/19 0508  WBC 7.3 9.2 10.2 9.5  --  8.8  CREATININE 0.72 0.70 0.70 0.77 0.76 0.55*    Estimated Creatinine Clearance: 114.5 mL/min (A) (by C-G formula based on SCr of 0.55 mg/dL (L)).    Allergies  Allergen Reactions  . Bactrim [Sulfamethoxazole-Trimethoprim] Other (See Comments)    Blisters in his mouth  . Budesonide-Formoterol Fumarate Other (See Comments)    Hyperglycemia  Hyperglycemia   . Advair Diskus [Fluticasone-Salmeterol] Other (See Comments)    hyperglycemia  . Hydromorphone Hives, Itching and Nausea And Vomiting  . Pulmicort [Budesonide] Other (See Comments)    Hyperglycemia   . Morphine Itching  . Soap Itching and Other (See Comments)    Other Reaction: ivory soap=itching      Thank you for allowing pharmacy to be a part of this patient's care.  Marguerite Olea, [facility] Clinical Pharmacist Phone [phone]  09/15/2019 7:02 PM
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
## 2019-09-15 NOTE — Progress Notes (Signed)
Spouse of patient stated she removed tape from ngt to nose, tube had come out a small amount, 4cm. Position of tip checked with aspiration of tube, obtained 161ml of residual, feedings stopped at time noticed tube had been untaped.  Once stomach contents aspirated placement was checked via ascultation, air heard in LUQ.  Patients oxygen level has remained in the upper 90's.  Tube feed returned once tube retaped to the previous taped point and auscultated placement once more.

## 2019-09-16 ENCOUNTER — Inpatient Hospital Stay (HOSPITAL_COMMUNITY): Payer: Medicare Other

## 2019-09-16 DIAGNOSIS — J1282 Pneumonia due to coronavirus disease 2019: Secondary | ICD-10-CM

## 2019-09-16 LAB — BLOOD GAS, ARTERIAL
Acid-Base Excess: 4.7 mmol/L — ABNORMAL HIGH (ref 0.0–2.0)
Bicarbonate: 29.5 mmol/L — ABNORMAL HIGH (ref 20.0–28.0)
FIO2: 60
O2 Saturation: 93.4 %
Patient temperature: 38.9
pCO2 arterial: 55.5 mmHg — ABNORMAL HIGH (ref 32.0–48.0)
pH, Arterial: 7.356 (ref 7.350–7.450)
pO2, Arterial: 75.7 mmHg — ABNORMAL LOW (ref 83.0–108.0)

## 2019-09-16 LAB — COMPREHENSIVE METABOLIC PANEL
ALT: 53 U/L — ABNORMAL HIGH (ref 0–44)
AST: 32 U/L (ref 15–41)
Albumin: 2.3 g/dL — ABNORMAL LOW (ref 3.5–5.0)
Alkaline Phosphatase: 89 U/L (ref 38–126)
Anion gap: 9 (ref 5–15)
BUN: 37 mg/dL — ABNORMAL HIGH (ref 8–23)
CO2: 29 mmol/L (ref 22–32)
Calcium: 10 mg/dL (ref 8.9–10.3)
Chloride: 106 mmol/L (ref 98–111)
Creatinine, Ser: 0.6 mg/dL — ABNORMAL LOW (ref 0.61–1.24)
GFR calc Af Amer: >60 mL/min (ref >60)
GFR calc non Af Amer: >60 mL/min (ref >60)
Glucose, Bld: 270 mg/dL — ABNORMAL HIGH (ref 70–99)
Potassium: 4.1 mmol/L (ref 3.5–5.1)
Sodium: 144 mmol/L (ref 135–145)
Total Bilirubin: 0.7 mg/dL (ref 0.3–1.2)
Total Protein: 6 g/dL — ABNORMAL LOW (ref 6.5–8.1)

## 2019-09-16 LAB — GLUCOSE, CAPILLARY
Glucose-Capillary: 156 mg/dL — ABNORMAL HIGH (ref 70–99)
Glucose-Capillary: 188 mg/dL — ABNORMAL HIGH (ref 70–99)
Glucose-Capillary: 223 mg/dL — ABNORMAL HIGH (ref 70–99)
Glucose-Capillary: 245 mg/dL — ABNORMAL HIGH (ref 70–99)
Glucose-Capillary: 254 mg/dL — ABNORMAL HIGH (ref 70–99)
Glucose-Capillary: 255 mg/dL — ABNORMAL HIGH (ref 70–99)

## 2019-09-16 LAB — CBC
HCT: 27.6 % — ABNORMAL LOW (ref 39.0–52.0)
Hemoglobin: 8.2 g/dL — ABNORMAL LOW (ref 13.0–17.0)
MCH: 34 pg (ref 26.0–34.0)
MCHC: 29.7 g/dL — ABNORMAL LOW (ref 30.0–36.0)
MCV: 114.5 fL — ABNORMAL HIGH (ref 80.0–100.0)
Platelets: 243 10*3/uL (ref 150–400)
RBC: 2.41 MIL/uL — ABNORMAL LOW (ref 4.22–5.81)
RDW: 22.1 % — ABNORMAL HIGH (ref 11.5–15.5)
WBC: 7.2 10*3/uL (ref 4.0–10.5)
nRBC: 0 % (ref 0.0–0.2)

## 2019-09-16 LAB — LACTIC ACID, PLASMA: Lactic Acid, Venous: 0.9 mmol/L (ref 0.5–1.9)

## 2019-09-16 MED ORDER — IOHEXOL 300 MG/ML  SOLN
50.0000 mL | Freq: Once | INTRAMUSCULAR | Status: AC | PRN
  Administered 2019-09-16: 15 mL

## 2019-09-16 MED ORDER — SODIUM CHLORIDE 0.9 % IV SOLN: INTRAVENOUS | Status: DC | PRN

## 2019-09-16 MED ORDER — FUROSEMIDE 10 MG/ML IJ SOLN
120.0000 mg | Freq: Once | INTRAVENOUS | Status: AC
  Administered 2019-09-16: 120 mg via INTRAVENOUS
  Filled 2019-09-16: qty 10

## 2019-09-16 MED ORDER — NONFORMULARY OR COMPOUNDED ITEM: 1.0000 | Freq: Two times a day (BID) | Status: DC

## 2019-09-16 MED ORDER — LIDOCAINE VISCOUS HCL 2 % MT SOLN: 6.0000 mL | Freq: Once | OROMUCOSAL | Status: AC

## 2019-09-16 MED ORDER — LIDOCAINE VISCOUS HCL 2 % MT SOLN
OROMUCOSAL | Status: AC
  Administered 2019-09-16: 5 mL via OROMUCOSAL
  Filled 2019-09-16: qty 15

## 2019-09-16 NOTE — Progress Notes (Signed)
CXR shows some pleural effusion, venous congestion and persistent lower lobe airspace disease. No elevated WBC or LA. Given IV lasix x 1. Maintaining o2 sats in the 90s on 40% FiO2 TC. Discussed with nursing, she is comfortable with him remaining on the floor for now. They are not set up for proning given his TC. Discussed possible transfer to ICU with wife should condition worsen, who still wants everything done.

## 2019-09-16 NOTE — Progress Notes (Signed)
Responded to staff alert for assistance. Patient oxygen desaturating into the upper 70s and lower 80s. Replaced oxygen probe to ear, turned FiO2 up to 40%, deep catheter suctioned the patient's trach and changed inner cannula before calling RT for assistance. Patient continued to sat 82-83% at best. RT recommended RRT call. Primary nurse calling now. Patient being monitored.

## 2019-09-16 NOTE — Progress Notes (Signed)
RT NOTES: ABG obtained and sent to lab. Lab notified of ABG needing to be analyzed.

## 2019-09-16 NOTE — Progress Notes (Signed)
RT NOTES: Entered room for 1200 trach check, patient not in room. Will check back later.

## 2019-09-16 NOTE — Progress Notes (Signed)
Patient with oxygen sat in the upper 70's to lower 80's.  Ear probe replaced, deep suctioning performed and inner trach cannula replaced with little effect.  Rapid response nurse and RT notified of above.  MD  Made aware.  Treatment per new orders.

## 2019-09-16 NOTE — Progress Notes (Signed)
RT NOTE: Called to room by RN. Patient's sats 78%. Patient placed on 98% fio2 through trach collar and suctioned for moderate thick brown secretions. Breathing treatment administered. Awaiting further orders from MD.

## 2019-09-16 NOTE — Progress Notes (Signed)
      INFECTIOUS DISEASE ATTENDING ADDENDUM:   Date: 09/16/2019  Patient name: [name]  Medical record number: [id number]  Date of birth: 1951/05/11   Patient was not in his room when I came to see him this morning  He is growing pseudomonas aeruginosa from tracheal aspirates with sensis pending  I have narrowed to cefepime  Dr. Linus Salmons to followup culture data over the weekend.   We may be able to then make final recs re a course of antibiotics for this pseudomonas.  I would note that the patient has had fevers for nearly most of the time since his original admission  I am relieved to hear that Palliative Care is seeing patient and the patients wife [name] seems to clearly understand what is going on here and what the trajectory looks like for this unfortunate person     Alcide Evener 09/16/2019, 3:37 PM

## 2019-09-16 NOTE — Progress Notes (Signed)
Assisted tele visit to patient with wife.  [name], Philis Nettle, RN

## 2019-09-16 NOTE — Progress Notes (Signed)
Called because patient had an episode of desat to 78% on TC.  Per Respiratory increased O2 to 100% then able to wean O2 to 60% TC.  ABG and PCXR done.   BP 136/61  HR 102  RR 20  O2 sat 99% on 60% Fio2  MD at bedside to see pt  RN to call if assistance is needed.

## 2019-09-16 NOTE — Progress Notes (Signed)
Assisted tele visit to patient with wife.  Chantele Corado, [name], RN

## 2019-09-16 NOTE — Progress Notes (Signed)
PROGRESS NOTE    [name]  [name] DOB: Mar 13, 1951 DOA: 07/22/2019 PCP: McLean-Scocuzza, Nino Glow, MD  Brief Narrative:  Patient is a 69 year old male with past medical history significant for diabetes mellitus type 2 on insulin, status post BKA, nephrolithiasis, PVD, hypertension, hyperlipidemia, Pseudomonas pneumonia, history of MRSA, gastroparesis, hyperparathyroidism, fatty liver, esophageal stricture, diastolic CHF, severe COPD, carotid artery stenosis, BPH, fatty liver and gastroparesis amongst other medical and cardiac problems.  Patient also has extensive surgical history.  Patient was admitted with Covid pneumonia on 07/22/2019, after having tested positive on 07/27/2019.  Patient was intubated 08/05/2019, was eventually transferred to [place] on 08/06/2019.  Patient was transferred to [facility] on 08/18/2019, was trached on 08/20/2019.  Hospitalist team assumed patient's care on 09/13/2019.  Patient remains critically ill, continues to have fever and not very responsive.  Per prior documentation, patient has been on multiple antibiotics: "Decadron 11/19 >11/29 Remdesivir 11/19 >11/23  Ceftriaxone 11/19 > 11/23 Azithro 11/19 > 11/23  Pip-tazo 11/26 >11/28 Levofloxacin 11/26 > 11/28  Aztreonam 11/28 >12/21 Ceftaz-avibactam 11/28 >12/12  Zosyn 12/24>>>  Vancomycin 12/24>>>"  IV Zosyn was changed to IV cefepime on 09/13/2019.  As per prior documentation, resolved hospital problems include: "DKA HCAP- s/p 14 day course of avycaz, azabactam finished 08/27/19 ARDS secondary to COVID pneumonia "  09/13/2019: We will change IV Zosyn to IV cefepime.  We will panculture the patient.  Blood sugar is poorly controlled.  BUN is elevated.  Patient is on tube feeds.  Patient has significant sacral decubitus ulcer, patient had wound VAC to the sacral decubitus ulcer but the wound VAC is currently on hold.  Guarded prognosis.  Patient stable to  give any history.  09/14/2019: Patient seen alongside patient's nurse.  No significant change overnight.  Patient continues to have fever.  Thick, nasty looking tracheal secretions reported.  We will send tracheal aspirate for cultures.  We will proceed with wound culture.  Hypertonic saline nebulizer.  Low threshold to consult infectious disease team.  09/15/2019: Patient seen.  No new changes.  Fever persists.  T-max is 103.1 F.  Discussed with infectious disease team, Dr. Dietrich [place], and Dr. Tommy Medal has kindly agreed to see patient in consultation.  CT chest, abdomen and pelvis revealed bilateral pleural effusion, small on the right and moderate on the left, with consolidative changes mainly on the right lung field.  We will proceed with thoracentesis and pleural fluid analysis by the interventional radiology team.  Will await input from the infectious disease team.  Prognosis remains guarded.  Further management will depend on hospital course.  09/16/2019: Patient seen with nursing staff. Lost NG and is s/p esophagectomy, bedside nursing says they cannot get it down.. Fever persists, Tmax 102.6. Vanc and Cefipime. Notes per ID and palliative reviewed. Wife updated on guarded prognosis, loss of NG. Will cut back on insulin while tube feeds are on hold, increase IVF. For thoracentesis today if possible, radiology will attempt to place NG under fluoro, Called. Remains full code.  Assessment & Plan:   Principal Problem:   Pneumonia due to COVID-19 virus Active Problems:   ANEMIA, IRON DEFICIENCY   CAD (coronary artery disease)   COPD, severe (HCC)   Gastroesophageal reflux disease with hiatal hernia   Diabetes mellitus type 2 in obese ([place])   COVID-19   ARDS (adult respiratory distress syndrome) (HCC)   Pressure injury of skin   Acute respiratory failure (HCC)   Acute encephalopathy  Acute respiratory failure with hypoxemia (HCC)   Palliative care encounter   Fever: -Panculture  patient (blood culture, urinalysis and urine culture from Foley catheter and wound culture). -Tracheal aspirate grew Pseudomonas.  Change Zosyn to cefepime. -Check procalcitonin level. -Further management depend on hospital course. 09/14/2019: Patient seen.  No new changes.  Fever continues.  Work-up as documented above.  Will have low threshold to consult infectious disease team. 09/15/2019: See above.  CT abdomen, chest and pelvis done.  Will proceed with thoracentesis and pleural fluid analysis.  Infectious disease team consulted.  Will await input from infectious disease team. 09/16/2019: for thoracentesis today, Abx per ID  Diabetes mellitus, uncontrolled: -Patient is on subcutaneous Lantus insulin 30 units twice daily. -Patient is also on sliding scale insulin coverage every 4 hours. -Blood sugar is currently running in the upper 300s. -Increase subcutaneous Lantus to 45 units twice daily. -Consult podiatry team to review tube feeds. 09/14/2019: Some improvement in blood sugar control.  Continue to optimize. 09/15/2019: Will add subcutaneous Novolin 5 units with tube feeds every 4 hours.  Blood sugar remains uncontrolled. 09/16/2019: CBGs improving yesterday  Sacral decubitus ulcer: Likely pressure ulcers Wound care and PT is appreciated Follow wound cultures Wound VAC is currently on hold.  Acute respiratory failure: -Recent Covid -Initially intubated, now on trach -Checks x-ray series revealing multiple quadrant infiltrates -Start DuoNeb 09/14/2019: Improved air entry.  Continue nebs.    Acute encephalopathy: -Patient remains encephalopathic. -No response from patient during this consultation. 09/14/2019: Patient is more responsive today, but is only intermittent. 09/16/2019: not responsive today  Tracheitis/possible pneumonia secondary to Pseudomonas aeruginosa: -Tracheal aspirate grew Pseudomonas aeruginosa. -IV Zosyn has been changed to IV cefepime. -Patient  continues to have fever. 09/14/2019: Continue IV cefepime. 09/16/2019: vanc added per ID  Hypertension: Continue to optimize.  Hypernatremia: Resolved.  Atrial fibrillation: Rate controlled. Continue Eliquis. 09/14/2019: Heart rate has ranged from 96 to 130 bpm.  Change nebs albuterol to Xopenex. 09/15/2019: Last documented heart rate was 117 bpm.  Increase Cardizem from 60 mg every 6 hourly to 90 mg every 6 hourly via PEG tube.  Optimize rate control. 09/16/2019: rate is still > 100 Guarded prognosis.   DVT prophylaxis: apixaban Code Status: full code and full intervention per family 09/15/2019 Family Communication: spoke with wife this am Disposition Plan: guarded prognosis   Consultants:   ID  Palliative care  IR/Rads   Procedures:   Trach  Thoracentesis  NG tube placement  Antimicrobials: Anti-infectives (From admission, onward)   Start     Dose/Rate Route Frequency Ordered Stop   09/15/19 1915  vancomycin (VANCOREADY) IVPB 1500 mg/300 mL     1,500 mg 150 mL/hr over 120 Minutes Intravenous Every 12 hours 09/15/19 1900     09/13/19 1400  ceFEPIme (MAXIPIME) 2 g in sodium chloride 0.9 % 100 mL IVPB     2 g 200 mL/hr over 30 Minutes Intravenous Every 8 hours 09/13/19 1234     09/09/19 0100  vancomycin (VANCOREADY) IVPB 1250 mg/250 mL  Status:  Discontinued     1,250 mg 166.7 mL/hr over 90 Minutes Intravenous Every 12 hours 09/08/19 1222 09/11/19 1203   09/08/19 1400  piperacillin-tazobactam (ZOSYN) IVPB 3.375 g  Status:  Discontinued     3.375 g 12.5 mL/hr over 240 Minutes Intravenous Every 8 hours 09/08/19 1222 09/13/19 1216   09/08/19 1230  vancomycin (VANCOREADY) IVPB 2000 mg/400 mL     2,000 mg 200 mL/hr over 120 Minutes Intravenous  Once  09/08/19 1222 09/08/19 1535   08/22/19 1800  ceftazidime-avibactam (AVYCAZ) 2.5 g in dextrose 5 % 50 mL IVPB     2.5 g 25 mL/hr over 2 Hours Intravenous Every 8 hours 08/22/19 1801 08/27/19 2359   08/17/19 1700   aztreonam (AZACTAM) 2 g in sodium chloride 0.9 % 100 mL IVPB     2 g 200 mL/hr over 30 Minutes Intravenous Every 8 hours 08/17/19 1555 08/27/19 2014   08/13/19 1900  ceftazidime-avibactam (AVYCAZ) 2.5 g in dextrose 5 % 50 mL IVPB  Status:  Discontinued     2.5 g 25 mL/hr over 2 Hours Intravenous Every 8 hours 08/13/19 1539 08/22/19 1801   08/13/19 1600  aztreonam (AZACTAM) 2 g in sodium chloride 0.9 % 100 mL IVPB  Status:  Discontinued     2 g 200 mL/hr over 30 Minutes Intravenous Every 8 hours 08/13/19 1548 08/17/19 1555   08/11/19 1400  piperacillin-tazobactam (ZOSYN) IVPB 3.375 g  Status:  Discontinued     3.375 g 12.5 mL/hr over 240 Minutes Intravenous Every 8 hours 08/11/19 1157 08/13/19 1540   08/11/19 0800  ceFEPIme (MAXIPIME) 2 g in sodium chloride 0.9 % 100 mL IVPB  Status:  Discontinued     2 g 200 mL/hr over 30 Minutes Intravenous Every 8 hours 08/11/19 0739 08/11/19 1156   08/11/19 0800  levofloxacin (LEVAQUIN) IVPB 750 mg  Status:  Discontinued     750 mg 100 mL/hr over 90 Minutes Intravenous Every 24 hours 08/11/19 0739 08/13/19 1540   08/06/19 1800  vancomycin (VANCOCIN) 1,250 mg in sodium chloride 0.9 % 250 mL IVPB  Status:  Discontinued     1,250 mg 166.7 mL/hr over 90 Minutes Intravenous Every 12 hours 08/06/19 1109 08/07/19 1524   08/06/19 0800  vancomycin (VANCOCIN) 1,250 mg in sodium chloride 0.9 % 250 mL IVPB  Status:  Discontinued     1,250 mg 166.7 mL/hr over 90 Minutes Intravenous Every 24 hours 08/05/19 1330 08/05/19 1348   08/06/19 0400  vancomycin (VANCOCIN) 2,000 mg in sodium chloride 0.9 % 500 mL IVPB     2,000 mg 250 mL/hr over 120 Minutes Intravenous  Once 08/06/19 0355 08/06/19 0656   08/05/19 2200  remdesivir 100 mg in sodium chloride 0.9 % 250 mL IVPB     100 mg 500 mL/hr over 30 Minutes Intravenous Every 24 hours 07/19/2019 2122 08/08/19 2012   08/05/19 2000  vancomycin (VANCOCIN) 1,250 mg in sodium chloride 0.9 % 250 mL IVPB  Status:  Discontinued       1,250 mg 166.7 mL/hr over 90 Minutes Intravenous Every 12 hours 08/05/19 0809 08/05/19 1330   08/05/19 0600  vancomycin (VANCOCIN) 2,000 mg in sodium chloride 0.9 % 500 mL IVPB     2,000 mg 250 mL/hr over 120 Minutes Intravenous  Once 08/05/19 0546 08/05/19 1056   08/05/19 0235  cefTRIAXone (ROCEPHIN) 1 g in sodium chloride 0.9 % 100 mL IVPB  Status:  Discontinued     1 g 200 mL/hr over 30 Minutes Intravenous Every 24 hours 08/05/19 0236 08/05/19 0548   08/05/19 0235  azithromycin (ZITHROMAX) 500 mg in sodium chloride 0.9 % 250 mL IVPB  Status:  Discontinued     500 mg 250 mL/hr over 60 Minutes Intravenous Every 24 hours 08/05/19 0236 08/05/19 0548   08/07/2019 2230  remdesivir 200 mg in sodium chloride 0.9 % 250 mL IVPB     200 mg 500 mL/hr over 30 Minutes Intravenous Once 07/21/2019 2122 08/03/2019  2258   08/12/2019 1730  cefTRIAXone (ROCEPHIN) 2 g in sodium chloride 0.9 % 100 mL IVPB  Status:  Discontinued     2 g 200 mL/hr over 30 Minutes Intravenous Every 24 hours 08/13/2019 1719 08/09/19 1153   07/21/2019 1730  azithromycin (ZITHROMAX) 500 mg in sodium chloride 0.9 % 250 mL IVPB  Status:  Discontinued     500 mg 250 mL/hr over 60 Minutes Intravenous Every 24 hours 08/01/2019 1719 08/09/19 1153        Subjective: No significant changes overnight. CBGs improved some < 10, thought to be machine error. Lost NG tube. HR in low 100s.  Objective: Vitals:   09/15/19 2350 09/16/19 0315 09/16/19 0406 09/16/19 0739  BP:    (!) 141/52  Pulse:  (!) 102  (!) 110  Resp: 18 (!) 23  (!) 28  Temp:   99.2 F (37.3 C)   TempSrc:   Oral   SpO2: 96% 98%  95%  Weight:      Height:        Intake/Output Summary (Last 24 hours) at 09/16/2019 0827 Last data filed at 09/16/2019 0513 Gross per 24 hour  Intake 410 ml  Output 3400 ml  Net -2990 ml   Filed Weights   09/09/19 0453 09/10/19 0500 09/15/19 0500  Weight: 117 kg 116 kg 103 kg    Examination:  General exam: Appears calm and comfortable, does  not respond to verbal stimulation. Trach in place.  Respiratory system: Coarse breath sounds noted throughout, moving good air Cardiovascular system: S1 & S2 heard, irregular rhythm. Gastrointestinal system: Abdomen is obese and soft Central nervous system: non-responsive Extremities: right BKA   Data Reviewed: I have personally reviewed following labs and imaging studies  CBC: Recent Labs  Lab 09/09/19 1521 09/10/19 0427 09/11/19 0257 09/12/19 0325 09/14/19 0508  WBC  --  9.2 10.2 9.5 8.8  NEUTROABS  --   --   --   --  5.7  HGB 8.6* 8.3* 8.9* 9.1* 8.7*  HCT 27.0* 26.0* 28.0* 29.8* 29.0*  MCV  --  105.3* 106.1* 110.0* 112.0*  PLT  --  336 360 365 [id number]   Basic Metabolic Panel: Recent Labs  Lab 09/10/19 0427 09/11/19 0257 09/12/19 0325 09/13/19 0434 09/14/19 0508  NA 145 144 146* 142 144  K 3.1* 2.8* 3.5 3.2* 3.6  CL 100 96* 97* 95* 101  CO2 35* 36* 38* 35* 35*  GLUCOSE 129* 141* 106* 361* 239*  BUN 48* 54* 54* 53* 39*  CREATININE 0.70 0.70 0.77 0.76 0.55*  CALCIUM 9.5 10.2 10.7* 10.4* 10.3  MG 2.0 2.1 2.5*  --  2.4  PHOS 3.1 2.2* 2.6  --  1.9*   GFR: Estimated Creatinine Clearance: 114.5 mL/min (A) (by C-G formula based on SCr of 0.55 mg/dL (L)). Liver Function Tests: Recent Labs  Lab 09/14/19 0508  ALBUMIN 2.4*   No results for input(s): LIPASE, AMYLASE in the last 168 hours. No results for input(s): AMMONIA in the last 168 hours. Coagulation Profile: No results for input(s): INR, PROTIME in the last 168 hours. Cardiac Enzymes: No results for input(s): CKTOTAL, CKMB, CKMBINDEX, TROPONINI in the last 168 hours. BNP (last 3 results) No results for input(s): PROBNP in the last 8760 hours. HbA1C: No results for input(s): HGBA1C in the last 72 hours. CBG: Recent Labs  Lab 09/16/19 0306 09/16/19 0307 09/16/19 0310 09/16/19 0312 09/16/19 0802  GLUCAP <10* <10* <10* 156* 254*   Lipid Profile: No results for  input(s): CHOL, HDL, LDLCALC, TRIG, CHOLHDL,  LDLDIRECT in the last 72 hours. Thyroid Function Tests: No results for input(s): TSH, T4TOTAL, FREET4, T3FREE, THYROIDAB in the last 72 hours. Anemia Panel: No results for input(s): VITAMINB12, FOLATE, FERRITIN, TIBC, IRON, RETICCTPCT in the last 72 hours. Urine analysis:    Component Value Date/Time   COLORURINE YELLOW 09/13/2019 [place] 09/13/2019 1345   LABSPEC 1.016 09/13/2019 1345   PHURINE 6.0 09/13/2019 1345   GLUCOSEU >=500 (A) 09/13/2019 1345   GLUCOSEU 500 03/25/2012 1101   HGBUR NEGATIVE 09/13/2019 1345   BILIRUBINUR NEGATIVE 09/13/2019 1345   BILIRUBINUR negative 10/31/2015 1514   BILIRUBINUR neg 01/19/2013 1125   KETONESUR NEGATIVE 09/13/2019 1345   PROTEINUR NEGATIVE 09/13/2019 1345   UROBILINOGEN >=8.0 10/31/2015 1514   UROBILINOGEN 1.0 03/25/2012 1101   NITRITE NEGATIVE 09/13/2019 1345   LEUKOCYTESUR TRACE (A) 09/13/2019 1345   Sepsis Labs: Recent Results (from the past 240 hour(s))  Culture, blood (routine x 2)     Status: None   Collection Time: 09/08/19 12:12 PM   Specimen: BLOOD LEFT HAND  Result Value Ref Range Status   Specimen Description BLOOD LEFT HAND  Final   Special Requests   Final    BOTTLES DRAWN AEROBIC AND ANAEROBIC Blood Culture adequate volume   Culture   Final    NO GROWTH 5 DAYS Performed at [facility], [place] [address] [place], [place] [address]    Report Status 09/13/2019 FINAL  Final  Culture, blood (routine x 2)     Status: None   Collection Time: 09/08/19 12:20 PM   Specimen: BLOOD RIGHT HAND  Result Value Ref Range Status   Specimen Description BLOOD RIGHT HAND  Final   Special Requests   Final    BOTTLES DRAWN AEROBIC ONLY Blood Culture results may not be optimal due to an inadequate volume of blood received in culture bottles   Culture   Final    NO GROWTH 5 DAYS Performed at [facility], [place] [address], [place], [place] [address]    Report Status 09/13/2019 FINAL  Final  MRSA PCR Screening      Status: None   Collection Time: 09/08/19  1:09 PM   Specimen: Nasal Mucosa; Nasopharyngeal  Result Value Ref Range Status   MRSA by PCR NEGATIVE NEGATIVE Final    Comment:        The GeneXpert MRSA Assay (FDA approved for NASAL specimens only), is one component of a comprehensive MRSA colonization surveillance program. It is not intended to diagnose MRSA infection nor to guide or monitor treatment for MRSA infections. Performed at [facility], [place] [address], [place], [place] [address]   Culture, respiratory     Status: None   Collection Time: 09/08/19  2:33 PM   Specimen: Tracheal Aspirate  Result Value Ref Range Status   Specimen Description TRACHEAL ASPIRATE  Final   Special Requests NONE  Final   Gram Stain   Final    MODERATE WBC PRESENT, PREDOMINANTLY PMN RARE SQUAMOUS EPITHELIAL CELLS PRESENT ABUNDANT GRAM POSITIVE COCCI MODERATE GRAM NEGATIVE RODS RARE GRAM POSITIVE RODS Performed at [facility], [address], [place], [place] [address]    Culture   Final    MODERATE GROUP B STREP(S.AGALACTIAE)ISOLATED TESTING AGAINST S. AGALACTIAE NOT ROUTINELY PERFORMED DUE TO PREDICTABILITY OF AMP/PEN/VAN SUSCEPTIBILITY. FEW PSEUDOMONAS AERUGINOSA    Report Status 09/12/2019 FINAL  Final   Organism ID, Bacteria PSEUDOMONAS AERUGINOSA  Final      Susceptibility   Pseudomonas aeruginosa - MIC*    CEFTAZIDIME 4 SENSITIVE Sensitive     CIPROFLOXACIN >=4 RESISTANT Resistant     GENTAMICIN 2 SENSITIVE Sensitive     IMIPENEM >=16 RESISTANT Resistant     PIP/TAZO 8 SENSITIVE Sensitive     CEFEPIME 4 SENSITIVE Sensitive     * FEW PSEUDOMONAS AERUGINOSA  Culture, blood (routine x 2)     Status: None (Preliminary result)   Collection Time: 09/13/19 12:47 PM   Specimen: BLOOD  Result Value Ref Range Status   Specimen Description BLOOD RIGHT ANTECUBITAL  Final   Special Requests   Final    BOTTLES DRAWN AEROBIC ONLY Blood Culture results may not be optimal due to  an inadequate volume of blood received in culture bottles   Culture   Final    NO GROWTH 2 DAYS Performed at [facility], [place] [address], [place], [place] [address]    Report Status PENDING  Incomplete  Culture, blood (routine x 2)     Status: None (Preliminary result)   Collection Time: 09/13/19 12:50 PM   Specimen: BLOOD RIGHT ARM  Result Value Ref Range Status   Specimen Description BLOOD RIGHT ARM  Final   Special Requests   Final    BOTTLES DRAWN AEROBIC ONLY Blood Culture results may not be optimal due to an inadequate volume of blood received in culture bottles   Culture   Final    NO GROWTH 2 DAYS Performed at [facility], [place] [address], [place], [place] [address]    Report Status PENDING  Incomplete  Culture, Urine     Status: None   Collection Time: 09/13/19  2:00 PM   Specimen: Urine, Catheterized  Result Value Ref Range Status   Specimen Description URINE, CATHETERIZED  Final   Special Requests NONE  Final   Culture   Final    NO GROWTH Performed at [facility], [address], [place], [place] [address]    Report Status 09/14/2019 FINAL  Final  Culture, respiratory     Status: None (Preliminary result)   Collection Time: 09/14/19  2:46 PM   Specimen: Tracheal Aspirate  Result Value Ref Range Status   Specimen Description TRACHEAL ASPIRATE  Final   Special Requests NONE  Final   Gram Stain   Final    ABUNDANT WBC PRESENT,BOTH PMN AND MONONUCLEAR FEW GRAM POSITIVE COCCI MODERATE YEAST    Culture   Final    CULTURE REINCUBATED FOR BETTER GROWTH Performed at [facility], [place] [address] [place], [place] [address]    Report Status PENDING  Incomplete  Aerobic Culture (superficial specimen)     Status: None (Preliminary result)   Collection Time: 09/15/19 11:22 AM   Specimen: Wound  Result Value Ref Range Status   Specimen Description WOUND SACRAL  Final   Special Requests Normal  Final   Gram Stain   Final    NO WBC  SEEN MODERATE GRAM NEGATIVE RODS RARE GRAM POSITIVE COCCI Performed at [facility], [address] [place], [place] [address]    Culture PENDING  Incomplete   Report Status PENDING  Incomplete    Radiology Studies: CT CHEST W CONTRAST  Result Date: 09/15/2019 CLINICAL DATA:  Fever of unknown origin, COVID pneumonia 07/2019 EXAM: CT CHEST, ABDOMEN, AND PELVIS WITH CONTRAST TECHNIQUE: Multidetector CT imaging of the chest, abdomen and pelvis was performed following  the standard protocol during bolus administration of intravenous contrast. CONTRAST:  180mL OMNIPAQUE IOHEXOL 300 MG/ML SOLN, additional enteric contrast COMPARISON:  CT abdomen pelvis, 12/07/2018, 11/05/2015 FINDINGS: CT CHEST FINDINGS Cardiovascular: No significant vascular findings. Normal heart size. Three-vessel coronary artery calcifications. No pericardial effusion. Mediastinum/Nodes: No enlarged mediastinal, hilar, or axillary lymph nodes. Thyroid is normal. Tracheostomy. Status post pull-through esophagectomy. Lungs/Pleura: Moderate left, small right pleural effusion with associated atelectasis or consolidation. There is extensive intermixed heterogeneous airspace disease and consolidation of the bilateral lungs with some degree of underlying scarring, particularly in the right lung, some of which is postoperative following thoracotomy and esophagectomy. Small, benign pulmonary nodule noted in the dependent right lung (series 5, image 109). Musculoskeletal: No chest wall mass or suspicious bone lesions identified. Multiple chronic fracture deformities of the right ribs and bridging callus of multiple levels. CT ABDOMEN PELVIS FINDINGS Hepatobiliary: No solid liver abnormality is seen. Sludge and/or tiny calcified gallstones in the gallbladder. No gallbladder wall thickening, or biliary dilatation. Pancreas: Unremarkable. No pancreatic ductal dilatation or surrounding inflammatory changes. Spleen: Normal in size without  significant abnormality. Adrenals/Urinary Tract: Adrenal glands are unremarkable. Small nonobstructive bilateral renal calculi. Foley catheter in the urinary bladder. The bladder is mildly thickened. Stomach/Bowel: Status post pull-through esophagectomy. Appendix is surgically absent. No evidence of bowel wall thickening, distention, or inflammatory changes. Rectal tube. Vascular/Lymphatic: Aortic atherosclerosis. No enlarged abdominal or pelvic lymph nodes. Reproductive: Mild prostatomegaly. Other: No abdominal wall hernia or abnormality. No abdominopelvic ascites. Musculoskeletal: Sacral decubitus ulcer open to bone (series 3, image 120). IMPRESSION: 1. Extensive intermixed heterogeneous airspace disease and consolidation of the bilateral lungs with some degree of underlying scarring, particularly in the right lung, some of which is postoperative following esophagectomy and pull-through. Findings are consistent with ongoing multifocal infection. 2. Moderate left, small right pleural effusions with associated atelectasis or consolidation. 3. Tracheostomy. 4. Sacral decubitus ulcer open to bone (series 3, image 120). MRI could be used to assess for bone marrow edema and osteomyelitis if desired. 5. Thickening of the urinary bladder, decompressed by Foley catheter, and likely due to chronic outlet obstruction. Correlate with urinalysis. 6. Sludge and/or tiny calcified gallstones in the gallbladder without CT findings to suggest acute cholecystitis. 7. Nonobstructive bilateral nephrolithiasis. 8. Status post gastric pull-through esophagectomy. 9. Coronary artery disease.  Aortic Atherosclerosis (ICD10-I70.0). Electronically Signed   By: Eddie Candle M.D.   On: 09/15/2019 15:18   CT ABDOMEN PELVIS W CONTRAST  Result Date: 09/15/2019 CLINICAL DATA:  Fever of unknown origin, COVID pneumonia 07/2019 EXAM: CT CHEST, ABDOMEN, AND PELVIS WITH CONTRAST TECHNIQUE: Multidetector CT imaging of the chest, abdomen and pelvis  was performed following the standard protocol during bolus administration of intravenous contrast. CONTRAST:  132mL OMNIPAQUE IOHEXOL 300 MG/ML SOLN, additional enteric contrast COMPARISON:  CT abdomen pelvis, 12/07/2018, 11/05/2015 FINDINGS: CT CHEST FINDINGS Cardiovascular: No significant vascular findings. Normal heart size. Three-vessel coronary artery calcifications. No pericardial effusion. Mediastinum/Nodes: No enlarged mediastinal, hilar, or axillary lymph nodes. Thyroid is normal. Tracheostomy. Status post pull-through esophagectomy. Lungs/Pleura: Moderate left, small right pleural effusion with associated atelectasis or consolidation. There is extensive intermixed heterogeneous airspace disease and consolidation of the bilateral lungs with some degree of underlying scarring, particularly in the right lung, some of which is postoperative following thoracotomy and esophagectomy. Small, benign pulmonary nodule noted in the dependent right lung (series 5, image 109). Musculoskeletal: No chest wall mass or suspicious bone lesions identified. Multiple chronic fracture deformities of the right ribs and bridging callus  of multiple levels. CT ABDOMEN PELVIS FINDINGS Hepatobiliary: No solid liver abnormality is seen. Sludge and/or tiny calcified gallstones in the gallbladder. No gallbladder wall thickening, or biliary dilatation. Pancreas: Unremarkable. No pancreatic ductal dilatation or surrounding inflammatory changes. Spleen: Normal in size without significant abnormality. Adrenals/Urinary Tract: Adrenal glands are unremarkable. Small nonobstructive bilateral renal calculi. Foley catheter in the urinary bladder. The bladder is mildly thickened. Stomach/Bowel: Status post pull-through esophagectomy. Appendix is surgically absent. No evidence of bowel wall thickening, distention, or inflammatory changes. Rectal tube. Vascular/Lymphatic: Aortic atherosclerosis. No enlarged abdominal or pelvic lymph nodes.  Reproductive: Mild prostatomegaly. Other: No abdominal wall hernia or abnormality. No abdominopelvic ascites. Musculoskeletal: Sacral decubitus ulcer open to bone (series 3, image 120). IMPRESSION: 1. Extensive intermixed heterogeneous airspace disease and consolidation of the bilateral lungs with some degree of underlying scarring, particularly in the right lung, some of which is postoperative following esophagectomy and pull-through. Findings are consistent with ongoing multifocal infection. 2. Moderate left, small right pleural effusions with associated atelectasis or consolidation. 3. Tracheostomy. 4. Sacral decubitus ulcer open to bone (series 3, image 120). MRI could be used to assess for bone marrow edema and osteomyelitis if desired. 5. Thickening of the urinary bladder, decompressed by Foley catheter, and likely due to chronic outlet obstruction. Correlate with urinalysis. 6. Sludge and/or tiny calcified gallstones in the gallbladder without CT findings to suggest acute cholecystitis. 7. Nonobstructive bilateral nephrolithiasis. 8. Status post gastric pull-through esophagectomy. 9. Coronary artery disease.  Aortic Atherosclerosis (ICD10-I70.0). Electronically Signed   By: Eddie Candle M.D.   On: 09/15/2019 15:18   DG CHEST PORT 1 VIEW  Result Date: 09/14/2019 CLINICAL DATA:  Abnormal breathing patterns, check tracheostomy EXAM: PORTABLE CHEST 1 VIEW COMPARISON:  09/11/2019 FINDINGS: Tracheostomy and NG tube remain in place, unchanged. Airspace disease bilaterally, right greater than left. No significant change since prior study. Cardiomegaly. Left PICC line remains in place, unchanged. IMPRESSION: Support devices including tracheostomy remain in stable position. Bilateral airspace disease, right greater than left, unchanged since prior study. Electronically Signed   By: Rolm Baptise M.D.   On: 09/14/2019 11:54   Scheduled Meds: . apixaban  5 mg Per Tube BID  . artificial tears   Both Eyes Q8H  .  chlorhexidine  15 mL Mouth/Throat BID  . Chlorhexidine Gluconate Cloth  6 each Topical Daily  . collagenase   Topical Daily  . Crystal Light Powder (Potassium Citrate)   1 packet Per Tube Daily  . diltiazem  90 mg Per Tube Q6H  . feeding supplement (PRO-STAT SUGAR FREE 64)  60 mL Per Tube Daily  . folic acid  1 mg Per Tube Daily  . free water  400 mL Per Tube Q4H  . insulin aspart  0-20 Units Subcutaneous Q4H  . insulin aspart  5 Units Subcutaneous Q4H  . insulin glargine  45 Units Subcutaneous BID  . ipratropium  0.5 mg Nebulization Q6H  . levalbuterol  0.63 mg Nebulization Q6H  . mouth rinse  15 mL Mouth Rinse 10 times per day  . Melatonin  3 mg Per Tube QHS  . metoprolol tartrate  100 mg Per Tube BID  . multivitamin  15 mL Per Tube Daily  . nutrition supplement (JUVEN)  1 packet Per Tube BID  . pantoprazole sodium  40 mg Per Tube Daily  . psyllium  1 packet Per Tube BID  . sodium chloride flush  10-40 mL Intracatheter Q12H  . sodium chloride flush  10-40 mL Intracatheter Q12H  .  sodium chloride HYPERTONIC  4 mL Nebulization BID  . thiamine  100 mg Per Tube Daily  . vitamin B-12  1,000 mcg Per Tube Daily   Continuous Infusions: . sodium chloride 10 mL/hr at 09/12/19 2300  . sodium chloride    . ceFEPime (MAXIPIME) IV 2 g (09/16/19 0506)  . feeding supplement (VITAL AF 1.2 CAL) 1,000 mL (09/15/19 2354)  . vancomycin 1,500 mg (09/16/19 0816)    LOS: 43 days   Time spent: 65 minutes  Donnamae Jude, MD [facility] Pager [phone]  If 7PM-7AM, please contact night-coverage [email or web address] Password [facility] 09/16/2019, 8:27 AM

## 2019-09-16 NOTE — Progress Notes (Addendum)
Called by RN to see pt. O2 sats dropped into the 70's. Respiratory increased his FiO2 to 98% from 28 % and O2 sats are now in the upper 90's. He has some increased work of breathing as well. He remains febrile at 102. Will check CXR and ABG and discuss care with his wife, she understands poor prognosis. Asks about proning. Spoke with nurse to see about turning him.  FiO2 weaned down to 60% and he is maintaining his sats right now.

## 2019-09-16 NOTE — Progress Notes (Signed)
Physical Therapy Wound Treatment Patient Details  Name: ALWIN LANIGAN MRN: [id number] Date of Birth: 09/02/1951  Today's Date: 09/16/2019 Time: 9798-9211 Time Calculation (min): 46 min  Subjective  Subjective: Pt lethargic and intermittent bouts of restlessness during session.   Patient and Family Stated Goals: No family present during session today.   Pain Score:  FACES 4/10  Wound Assessment  Pressure Injury 08/10/19 Sacrum Mid Unstageable - Full thickness tissue loss in which the base of the injury is covered by slough (yellow, tan, gray, green or brown) and/or eschar (tan, brown or black) in the wound bed. wound has evolved into unstageable (Active)  Dressing Type ABD;Barrier Film (skin prep);Gauze (Comment);Moist to dry;Non adherent 09/16/19 1050  Dressing Clean;Dry;Intact;Changed 09/16/19 1050  Dressing Change Frequency Daily 09/16/19 1050  State of Healing Non-healing 09/16/19 1050  Site / Wound Assessment Pink;Yellow;Brown 09/16/19 1050  % Wound base Red or Granulating 15% 09/16/19 1050  % Wound base Yellow/Fibrinous Exudate 60% 09/16/19 1050  % Wound base Black/Eschar 25% 09/16/19 1050  % Wound base Other/Granulation Tissue (Comment) 0% 09/16/19 1050  Peri-wound Assessment Erythema (non-blanchable);Purple 09/16/19 1050  Wound Length (cm) 5 cm 09/07/19 1400  Wound Width (cm) 8 cm 09/07/19 1400  Wound Depth (cm) 0.1 cm 09/07/19 1400  Wound Surface Area (cm^2) 40 cm^2 09/07/19 1400  Wound Volume (cm^3) 4 cm^3 09/07/19 1400  Margins Unattached edges (unapproximated) 09/16/19 1050  Drainage Amount Minimal 09/16/19 1050  Drainage Description Serosanguineous 09/16/19 1050  Treatment Cleansed;Debridement (Selective);Hydrotherapy (Pulse lavage) 09/16/19 1050     Santyl applied to wound bed prior to applying dressing.  Hydrotherapy Pulsed lavage therapy - wound location: Sacrum Pulsed Lavage with Suction (psi): 12 psi Pulsed Lavage with Suction - Normal Saline Used: 1000  mL Pulsed Lavage Tip: Tip with splash shield Selective Debridement Selective Debridement - Location: Sacrum Selective Debridement - Tools Used: Forceps;Scissors Selective Debridement - Tissue Removed: yellow and brown necrotic tissue   Wound Assessment and Plan  Wound Therapy - Assess/Plan/Recommendations Wound Therapy - Clinical Statement: Pt with stool leaking from flexiseal on arrival, After pulse lavage flexiseal dislodge and he required multiple bouts pericare before wound care could continues.  Wound bed is soft and able to remove non viable tissue this session.  Periwound is less macerated but noted with necrosis in some areas of periwound.  Continue to benefit from hydro therapy to decrease necrosis and reduce bioburden.   Wound Therapy - Functional Problem List: Global weakness in the setting of critical illness.  Factors Delaying/Impairing Wound Healing: Immobility;Multiple medical problems Hydrotherapy Plan: Debridement;Dressing change;Patient/family education;Pulsatile lavage with suction Wound Therapy - Frequency: 6X / week Wound Therapy - Follow Up Recommendations: Skilled nursing facility(vs LTACH) Wound Plan: See above  Wound Therapy Goals- Improve the function of patient's integumentary system by progressing the wound(s) through the phases of wound healing (inflammation - proliferation - remodeling) by: Decrease Necrotic Tissue to: 0 Decrease Necrotic Tissue - Progress: Progressing toward goal Increase Granulation Tissue to: 100 Increase Granulation Tissue - Progress: Progressing toward goal Goals/treatment plan/discharge plan were made with and agreed upon by patient/family: No, Patient unable to participate in goals/treatment/discharge plan and family unavailable Time For Goal Achievement: 7 days Wound Therapy - Potential for Goals: Good  Goals will be updated until maximal potential achieved or discharge criteria met.  Discharge criteria: when goals achieved, discharge  from hospital, MD decision/surgical intervention, no progress towards goals, refusal/missing three consecutive treatments without notification or medical reason.  GP     Aryon Nham  Eli Hose 09/16/2019, 12:46 PM Erasmo Leventhal , PTA Acute Rehabilitation Services Pager [phone] Office [phone]

## 2019-09-16 DEATH — deceased

## 2019-09-17 ENCOUNTER — Inpatient Hospital Stay (HOSPITAL_COMMUNITY): Payer: Medicare Other

## 2019-09-17 DIAGNOSIS — B379 Candidiasis, unspecified: Secondary | ICD-10-CM

## 2019-09-17 LAB — GLUCOSE, CAPILLARY
Glucose-Capillary: 265 mg/dL — ABNORMAL HIGH (ref 70–99)
Glucose-Capillary: 255 mg/dL — ABNORMAL HIGH (ref 70–99)
Glucose-Capillary: 207 mg/dL — ABNORMAL HIGH (ref 70–99)
Glucose-Capillary: 198 mg/dL — ABNORMAL HIGH (ref 70–99)
Glucose-Capillary: 193 mg/dL — ABNORMAL HIGH (ref 70–99)
Glucose-Capillary: 191 mg/dL — ABNORMAL HIGH (ref 70–99)

## 2019-09-17 LAB — CBC
HCT: 28.5 % — ABNORMAL LOW (ref 39.0–52.0)
Hemoglobin: 8.5 g/dL — ABNORMAL LOW (ref 13.0–17.0)
MCH: 33.5 pg (ref 26.0–34.0)
MCHC: 29.8 g/dL — ABNORMAL LOW (ref 30.0–36.0)
MCV: 112.2 fL — ABNORMAL HIGH (ref 80.0–100.0)
Platelets: 262 10*3/uL (ref 150–400)
RBC: 2.54 MIL/uL — ABNORMAL LOW (ref 4.22–5.81)
RDW: 21.8 % — ABNORMAL HIGH (ref 11.5–15.5)
WBC: 7.4 10*3/uL (ref 4.0–10.5)
nRBC: 0 % (ref 0.0–0.2)

## 2019-09-17 LAB — AEROBIC CULTURE W GRAM STAIN (SUPERFICIAL SPECIMEN)
Gram Stain: NONE SEEN
Special Requests: NORMAL

## 2019-09-17 LAB — CULTURE, RESPIRATORY W GRAM STAIN

## 2019-09-17 MED ORDER — LIDOCAINE HCL (PF) 1 % IJ SOLN
INTRAMUSCULAR | Status: AC
  Filled 2019-09-17: qty 30

## 2019-09-17 NOTE — Progress Notes (Signed)
[facility] for Infectious Disease   Reason for visit: Follow up on pneumonia  Interval History: Pseudomonas on cultures noted, WBC wnl, fever curve trending down.  Patient otherwise non-verbal; IR evaluated and not sufficient fluid for thoracentesis.  Day 5 cefepime   Physical Exam: Constitutional:  Vitals:   09/17/19 0735 09/17/19 0831  BP:  104/85  Pulse:  (!) 123  Resp:  (!) 22  Temp:  98.8 F (37.1 C)  SpO2: 94% 92%   patient appears in NAD, currently prone Eyes: anicteric Respiratory: some increased respiratory effort/rate; diffuse rhonchi Cardiovascular: RRR GI: soft, nt, nd MS: no edema  Review of Systems: Unable to be assessed due to mental status  Lab Results  Component Value Date   WBC 7.4 09/17/2019   HGB 8.5 (L) 09/17/2019   HCT 28.5 (L) 09/17/2019   MCV 112.2 (H) 09/17/2019   PLT 262 09/17/2019    Lab Results  Component Value Date   CREATININE 0.60 (L) 09/16/2019   BUN 37 (H) 09/16/2019   NA 144 09/16/2019   K 4.1 09/16/2019   CL 106 09/16/2019   CO2 29 09/16/2019    Lab Results  Component Value Date   ALT 53 (H) 09/16/2019   AST 32 09/16/2019   ALKPHOS 89 09/16/2019     Microbiology: Recent Results (from the past 240 hour(s))  Culture, blood (routine x 2)     Status: None   Collection Time: 09/08/19 12:12 PM   Specimen: BLOOD LEFT HAND  Result Value Ref Range Status   Specimen Description BLOOD LEFT HAND  Final   Special Requests   Final    BOTTLES DRAWN AEROBIC AND ANAEROBIC Blood Culture adequate volume   Culture   Final    NO GROWTH 5 DAYS Performed at [facility], [address] [place], [place] [address]    Report Status 09/13/2019 FINAL  Final  Culture, blood (routine x 2)     Status: None   Collection Time: 09/08/19 12:20 PM   Specimen: BLOOD RIGHT HAND  Result Value Ref Range Status   Specimen Description BLOOD RIGHT HAND  Final   Special Requests   Final    BOTTLES DRAWN AEROBIC ONLY Blood Culture results  may not be optimal due to an inadequate volume of blood received in culture bottles   Culture   Final    NO GROWTH 5 DAYS Performed at [facility], [place] [address] [place], [place] [address]    Report Status 09/13/2019 FINAL  Final  MRSA PCR Screening     Status: None   Collection Time: 09/08/19  1:09 PM   Specimen: Nasal Mucosa; Nasopharyngeal  Result Value Ref Range Status   MRSA by PCR NEGATIVE NEGATIVE Final    Comment:        The GeneXpert MRSA Assay (FDA approved for NASAL specimens only), is one component of a comprehensive MRSA colonization surveillance program. It is not intended to diagnose MRSA infection nor to guide or monitor treatment for MRSA infections. Performed at [facility], [place] [address], [place], [place] [address]   Culture, respiratory     Status: None   Collection Time: 09/08/19  2:33 PM   Specimen: Tracheal Aspirate  Result Value Ref Range Status   Specimen Description TRACHEAL ASPIRATE  Final   Special Requests NONE  Final   Gram Stain   Final    MODERATE WBC PRESENT, PREDOMINANTLY PMN RARE SQUAMOUS EPITHELIAL CELLS PRESENT ABUNDANT  GRAM POSITIVE COCCI MODERATE GRAM NEGATIVE RODS RARE GRAM POSITIVE RODS Performed at [facility], [place] [address] [place], [place] [address]    Culture   Final    MODERATE GROUP B STREP(S.AGALACTIAE)ISOLATED TESTING AGAINST S. AGALACTIAE NOT ROUTINELY PERFORMED DUE TO PREDICTABILITY OF AMP/PEN/VAN SUSCEPTIBILITY. FEW PSEUDOMONAS AERUGINOSA    Report Status 09/12/2019 FINAL  Final   Organism ID, Bacteria PSEUDOMONAS AERUGINOSA  Final      Susceptibility   Pseudomonas aeruginosa - MIC*    CEFTAZIDIME 4 SENSITIVE Sensitive     CIPROFLOXACIN >=4 RESISTANT Resistant     GENTAMICIN 2 SENSITIVE Sensitive     IMIPENEM >=16 RESISTANT Resistant     PIP/TAZO 8 SENSITIVE Sensitive     CEFEPIME 4 SENSITIVE Sensitive     * FEW PSEUDOMONAS AERUGINOSA  Culture, blood (routine x 2)     Status: None  (Preliminary result)   Collection Time: 09/13/19 12:47 PM   Specimen: BLOOD  Result Value Ref Range Status   Specimen Description BLOOD RIGHT ANTECUBITAL  Final   Special Requests   Final    BOTTLES DRAWN AEROBIC ONLY Blood Culture results may not be optimal due to an inadequate volume of blood received in culture bottles   Culture   Final    NO GROWTH 4 DAYS Performed at [facility], [place] [address], [place], [place] [address]    Report Status PENDING  Incomplete  Culture, blood (routine x 2)     Status: None (Preliminary result)   Collection Time: 09/13/19 12:50 PM   Specimen: BLOOD RIGHT ARM  Result Value Ref Range Status   Specimen Description BLOOD RIGHT ARM  Final   Special Requests   Final    BOTTLES DRAWN AEROBIC ONLY Blood Culture results may not be optimal due to an inadequate volume of blood received in culture bottles   Culture   Final    NO GROWTH 4 DAYS Performed at [facility], [place] [address], [place], [place] [address]    Report Status PENDING  Incomplete  Culture, Urine     Status: None   Collection Time: 09/13/19  2:00 PM   Specimen: Urine, Catheterized  Result Value Ref Range Status   Specimen Description URINE, CATHETERIZED  Final   Special Requests NONE  Final   Culture   Final    NO GROWTH Performed at [facility], [address] [place], [place] [address]    Report Status 09/14/2019 FINAL  Final  Culture, respiratory     Status: None   Collection Time: 09/14/19  2:46 PM   Specimen: Tracheal Aspirate  Result Value Ref Range Status   Specimen Description TRACHEAL ASPIRATE  Final   Special Requests NONE  Final   Gram Stain   Final    ABUNDANT WBC PRESENT,BOTH PMN AND MONONUCLEAR FEW GRAM POSITIVE COCCI MODERATE YEAST    Culture   Final    FEW PSEUDOMONAS AERUGINOSA FEW CANDIDA ALBICANS Standardized susceptibility testing for this organism is not available. Performed at [facility], [place] [address] [place], [place]  [address]    Report Status 09/17/2019 FINAL  Final   Organism ID, Bacteria PSEUDOMONAS AERUGINOSA  Final      Susceptibility   Pseudomonas aeruginosa - MIC*    CEFTAZIDIME 8 SENSITIVE Sensitive     CIPROFLOXACIN >=4 RESISTANT Resistant     GENTAMICIN 4 SENSITIVE Sensitive     IMIPENEM >=16 RESISTANT Resistant     PIP/TAZO  8 SENSITIVE Sensitive     CEFEPIME 4 SENSITIVE Sensitive     * FEW PSEUDOMONAS AERUGINOSA  Aerobic Culture (superficial specimen)     Status: None (Preliminary result)   Collection Time: 09/15/19 11:22 AM   Specimen: Wound  Result Value Ref Range Status   Specimen Description WOUND SACRAL  Final   Special Requests Normal  Final   Gram Stain   Final    NO WBC SEEN MODERATE GRAM NEGATIVE RODS RARE GRAM POSITIVE COCCI Performed at [facility], [address] [place], [place] [address]    Culture ABUNDANT PSEUDOMONAS AERUGINOSA  Final   Report Status PENDING  Incomplete    Impression/Plan:  1. Pneumonia - CXR findings noted and have improved.  Fever is trending down and culture with Pseduomonas and cefepime sensitive.  Candida also noted, though most likely thrush/colonization.  Continue with cefepime for 10 days total, now day 5/10.  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 since he has had Pseudomonas in his respiratory culture since November.    2. Fever - trending down and will monitor.  He has been febrile for much of this hospitalization and likely this will continue intermittently.    3.  Palliative care - he is followed by palliative care and agree with the poor prognosis.

## 2019-09-17 NOTE — Progress Notes (Addendum)
Rapid Response Follow-Up  Oxygen saturation 94% on Trach collar 8L, 35% FiO2. Pt tracheal suctioned with brown, thick, frothy sputum suctioned out. Lung sounds are rhonchus in all lung fields. HR 126 bpm, RR 22.

## 2019-09-17 NOTE — Progress Notes (Signed)
Physical Therapy Wound Treatment Patient Details  Name: [name] MRN: [id number] Date of Birth: 27-Jun-1951  Today's Date: 09/17/2019 Time: 8003-4917 Time Calculation (min): 49 min  Subjective  Subjective: Pt lethargic and intermittent bouts of restlessness during session.   Patient and Family Stated Goals: No family present during session today.   Pain Score:  FACES 4/10  Wound Assessment  Pressure Injury 08/10/19 Sacrum Mid Unstageable - Full thickness tissue loss in which the base of the injury is covered by slough (yellow, tan, gray, green or brown) and/or eschar (tan, brown or black) in the wound bed. wound has evolved into unstageable (Active)  Wound Image   09/13/19 1253  Dressing Type ABD;Barrier Film (skin prep);Gauze (Comment);Moist to dry;Non adherent 09/17/19 1200  Dressing Changed;Clean;Dry;Intact 09/17/19 1200  Dressing Change Frequency Daily 09/17/19 1200  State of Healing Non-healing 09/17/19 1200  Site / Wound Assessment Pink;Yellow;Brown 09/17/19 1200  % Wound base Red or Granulating 15% 09/17/19 1200  % Wound base Yellow/Fibrinous Exudate 60% 09/17/19 1200  % Wound base Black/Eschar 25% 09/17/19 1200  % Wound base Other/Granulation Tissue (Comment) 0% 09/16/19 1050  Peri-wound Assessment Erythema (non-blanchable);Purple;Denuded;Excoriated 09/17/19 1200  Wound Length (cm) 5 cm 09/07/19 1400  Wound Width (cm) 8 cm 09/07/19 1400  Wound Depth (cm) 0.1 cm 09/07/19 1400  Wound Surface Area (cm^2) 40 cm^2 09/07/19 1400  Wound Volume (cm^3) 4 cm^3 09/07/19 1400  Margins Unattached edges (unapproximated) 09/17/19 1200  Drainage Amount Minimal 09/17/19 1200  Drainage Description Serosanguineous 09/17/19 1200  Treatment Cleansed;Debridement (Selective);Hydrotherapy (Pulse lavage);Packing (Saline gauze);Tape changed 09/17/19 1200   Hydrotherapy Pulsed lavage therapy - wound location: Sacrum Pulsed Lavage with Suction (psi): 12 psi Pulsed Lavage with Suction -  Normal Saline Used: 1000 mL Pulsed Lavage Tip: Tip with splash shield Selective Debridement Selective Debridement - Location: Sacrum Selective Debridement - Tools Used: Forceps;Scissors Selective Debridement - Tissue Removed: yellow and brown necrotic tissue   Wound Assessment and Plan  Wound Therapy - Assess/Plan/Recommendations Wound Therapy - Clinical Statement: Patient with continued multiple medical issues limiting wound healing.  Benefiting from skilled PT for hydrotherapy to aid in debridement, decrease bioburden due to systemic infection and to promote wound bed healing.    Wound Therapy - Functional Problem List: Global weakness in the setting of critical illness.  Factors Delaying/Impairing Wound Healing: Infection - systemic/local;Immobility;Multiple medical problems Hydrotherapy Plan: Debridement;Dressing change;Pulsatile lavage with suction;Patient/family education Wound Therapy - Frequency: 6X / week Wound Therapy - Follow Up Recommendations: Skilled nursing facility(vs. LTACH) Wound Plan: See above  Wound Therapy Goals- Improve the function of patient's integumentary system by progressing the wound(s) through the phases of wound healing (inflammation - proliferation - remodeling) by: Decrease Necrotic Tissue to: 0 Decrease Necrotic Tissue - Progress: Progressing toward goal Increase Granulation Tissue to: 100 Increase Granulation Tissue - Progress: Progressing toward goal Goals/treatment plan/discharge plan were made with and agreed upon by patient/family: No, Patient unable to participate in goals/treatment/discharge plan and family unavailable Time For Goal Achievement: 7 days Wound Therapy - Potential for Goals: Good  Goals will be updated until maximal potential achieved or discharge criteria met.  Discharge criteria: when goals achieved, discharge from hospital, MD decision/surgical intervention, no progress towards goals, refusal/missing three consecutive treatments  without notification or medical reason.  GP     Reginia Naas 09/17/2019, 12:22 PM  Magda Kiel, [place] [phone] 09/17/2019

## 2019-09-17 NOTE — Progress Notes (Signed)
Patient's daughter, [name], in to visit today.  [name] voiced to this Probation officer that she is worried her step mom will come in and upset her dad.  This Probation officer reviewed Development worker, community with [employer].  [name] then stated that she has a text message from her step mom which says that "step mom only contacted palliative care because she knew that she could get around the visitor policy to visit the patient."

## 2019-09-17 NOTE — Progress Notes (Signed)
Pt's daughter Jeannetta Nap called to check on pt's status. Information about pt's status provided. Daughter Jeannetta Nap is tearful. Encouraged daughter to call with anymore questions or concerns.

## 2019-09-17 NOTE — Progress Notes (Addendum)
PROGRESS NOTE  [name] [name] DOB: [birth year] DOA: 08/03/2019 PCP: McLean-Scocuzza, Nino Glow, MD  HPI/Recap of past 64 hours: 69 year old male with past medical history significant for diabetes mellitus type 2 on insulin, status post BKA, nephrolithiasis, peripheral vascular disease, hypertension, hyperlipidemia, Pseudomonas pneumonia history of MRSA gastroparesis hypothyroidism fatty liver esophageal stricture diastolic CHF severe COPD carotid artery disease BPH fatty liver patient also has extensive surgical history.  He was admitted with Covid pneumonia on August 04, 2019 after receiving positive test on November 11, patient was intubated November 20, and was transferred to [place] on August 06, 2019 then transferred to [facility] on August 18, 2019 with tracheal collar and on August 30, 2019.  Patient is remains critically ill with fever and poorly responsive.  His wife wants him full code and have everything done.  September 16, 2018  Subjective: Patient seen at bedside nurse reports that he continues to have fever.  Also in the dark exudate from his NG tube is from trauma during NG tube placement his decubitus ulcer is being treated by IV team who bedside at this time.  He also reported dark material coming from his NG tube.  Assessment/Plan: Principal Problem:   Pneumonia due to COVID-19 virus Active Problems:   ANEMIA, IRON DEFICIENCY   CAD (coronary artery disease)   COPD, severe (HCC)   Gastroesophageal reflux disease with hiatal hernia   Diabetes mellitus type 2 in obese ([place])   COVID-19   ARDS (adult respiratory distress syndrome) (HCC)   Pressure injury of skin   Acute respiratory failure (HCC)   Acute encephalopathy   Acute respiratory failure with hypoxemia (HCC)   Palliative care encounter #1 fever patient's white count is normal.  His drug overdose bradycardia is grew Pseudomonas so his antibiotics was changed from Zosyn to  cefepime  2.  Diabetes mellitus uncontrolled, his sugars however getting better he is currently on Lantus 30 units twice daily with sliding scale  3.  Sacral decubitus ulcer wound care is currently following patient and doing dressing changes as currently the ulcer is unstageable.  4.  Acute respiratory failure with recent Covid pneumonia, status post remdesivir treatment..  Patient was previously intubated now on tracheal collar  5.  Acute encephalopathy.  Patient remains encephalopathic and minimally responsive  6.  Pneumonia secondary to Pseudomonas versus tracheitis.  His tracheal aspirate grew Pseudomonas aeruginosa.  Patient is currently on IV cefepime infectious disease recommendation Appreciated.  They recommend to continue cefepime for 10 days total.   Code Status: Full  Severity of Illness: The appropriate patient status for this patient is INPATIENT. Inpatient status is judged to be reasonable and necessary in order to provide the required intensity of service to ensure the patient's safety. The patient's presenting symptoms, physical exam findings, and initial radiographic and laboratory data in the context of their chronic comorbidities is felt to place them at high risk for further clinical deterioration. Furthermore, it is not anticipated that the patient will be medically stable for discharge from the hospital within 2 midnights of admission. The following factors support the patient status of inpatient.   " The patient's presenting symptoms include severe sepsis decubitus ulcer patient is receiving antibiotics and wound care. "   * I certify that at the point of admission it is my clinical judgment that the patient decubitus ulcer require inpatient hospital care spanning beyond 2 midnights from the point of admission due to high intensity of service,  high risk for further deterioration and high frequency of surveillance required.*    Family Communication: None at  bedside  Disposition Plan: To be determined   Consultants:  Infectious disease  Palliative care  IR radiology  Procedures:  Trachea:  NG tube placement  Was postop done thoracentesis today but it was too little fluid on no fluid to be tapped  Antimicrobials:  Cefepime  Ceftriaxone 11/19 > 11/23 Azithro 11/19 > 11/23  Pip-tazo 11/26 >11/28 Levofloxacin 11/26 > 11/28  Aztreonam 11/28 >12/21 Ceftaz-avibactam 11/28 >12/12  Zosyn 12/24>>> Vancomycin 12/24>>>"  IV Zosyn was changed to IV cefepime   DVT prophylaxis: Apixaban   Objective: Vitals:   09/17/19 0453 09/17/19 0700 09/17/19 0735 09/17/19 0831  BP: (!) 108/46 130/72  104/85  Pulse: (!) 122 (!) 125  (!) 123  Resp: (!) 23 15  (!) 22  Temp:    98.8 F (37.1 C)  TempSrc:      SpO2: 96% 95% 94% 92%  Weight:      Height:        Intake/Output Summary (Last 24 hours) at 09/17/2019 0938 Last data filed at 09/17/2019 0645 Gross per 24 hour  Intake 1389 ml  Output 3200 ml  Net -1811 ml   Filed Weights   09/09/19 0453 09/10/19 0500 09/15/19 0500  Weight: 117 kg 116 kg 103 kg   Body mass index is 29.95 kg/m.  Exam:  . General: 69 y.o. year-old male well developed well nourished in no acute distress.   . Cardiovascular: Regular rate and rhythm with no rubs or gallops.  No thyromegaly or JVD noted.   Marland Kitchen Respiratory: Clear to auscultation with no wheezes or rales. Good inspiratory effort. . Abdomen: Soft nontender nondistended with normal bowel sounds x4 quadrants. . Musculoskeletal: No lower extremity edema. 2/4 pulses in all 4 extremities. . Skin: Large unstageable . Psychiatry: Mood is appropriate for condition and setting    Data Reviewed: CBC: Recent Labs  Lab 09/11/19 0257 09/12/19 0325 09/14/19 0508 09/16/19 1514 09/17/19 0533  WBC 10.2 9.5 8.8 7.2 7.4  NEUTROABS  --   --  5.7  --   --   HGB 8.9* 9.1* 8.7* 8.2* 8.5*  HCT 28.0* 29.8* 29.0* 27.6* 28.5*  MCV 106.1* 110.0*  112.0* 114.5* 112.2*  PLT 360 365 309 243 [id number]   Basic Metabolic Panel: Recent Labs  Lab 09/11/19 0257 09/12/19 0325 09/13/19 0434 09/14/19 0508 09/16/19 1514  NA 144 146* 142 144 144  K 2.8* 3.5 3.2* 3.6 4.1  CL 96* 97* 95* 101 106  CO2 36* 38* 35* 35* 29  GLUCOSE 141* 106* 361* 239* 270*  BUN 54* 54* 53* 39* 37*  CREATININE 0.70 0.77 0.76 0.55* 0.60*  CALCIUM 10.2 10.7* 10.4* 10.3 10.0  MG 2.1 2.5*  --  2.4  --   PHOS 2.2* 2.6  --  1.9*  --    GFR: Estimated Creatinine Clearance: 114.5 mL/min (A) (by C-G formula based on SCr of 0.6 mg/dL (L)). Liver Function Tests: Recent Labs  Lab 09/14/19 0508 09/16/19 1514  AST  --  32  ALT  --  53*  ALKPHOS  --  89  BILITOT  --  0.7  PROT  --  6.0*  ALBUMIN 2.4* 2.3*   No results for input(s): LIPASE, AMYLASE in the last 168 hours. No results for input(s): AMMONIA in the last 168 hours. Coagulation Profile: No results for input(s): INR, PROTIME in the last 168 hours. Cardiac Enzymes: No  results for input(s): CKTOTAL, CKMB, CKMBINDEX, TROPONINI in the last 168 hours. BNP (last 3 results) No results for input(s): PROBNP in the last 8760 hours. HbA1C: No results for input(s): HGBA1C in the last 72 hours. CBG: Recent Labs  Lab 09/16/19 1603 09/16/19 1946 09/16/19 2346 09/17/19 0358 09/17/19 0832  GLUCAP 245* 188* 255* 265* 255*   Lipid Profile: No results for input(s): CHOL, HDL, LDLCALC, TRIG, CHOLHDL, LDLDIRECT in the last 72 hours. Thyroid Function Tests: No results for input(s): TSH, T4TOTAL, FREET4, T3FREE, THYROIDAB in the last 72 hours. Anemia Panel: No results for input(s): VITAMINB12, FOLATE, FERRITIN, TIBC, IRON, RETICCTPCT in the last 72 hours. Urine analysis:    Component Value Date/Time   COLORURINE YELLOW 09/13/2019 [place] 09/13/2019 1345   LABSPEC 1.016 09/13/2019 1345   PHURINE 6.0 09/13/2019 1345   GLUCOSEU >=500 (A) 09/13/2019 1345   GLUCOSEU 500 03/25/2012 1101   HGBUR  NEGATIVE 09/13/2019 1345   BILIRUBINUR NEGATIVE 09/13/2019 1345   BILIRUBINUR negative 10/31/2015 1514   BILIRUBINUR neg 01/19/2013 1125   KETONESUR NEGATIVE 09/13/2019 1345   PROTEINUR NEGATIVE 09/13/2019 1345   UROBILINOGEN >=8.0 10/31/2015 1514   UROBILINOGEN 1.0 03/25/2012 1101   NITRITE NEGATIVE 09/13/2019 1345   LEUKOCYTESUR TRACE (A) 09/13/2019 1345   Sepsis Labs: @LABRCNTIP (procalcitonin:4,lacticidven:4)  ) Recent Results (from the past 240 hour(s))  Culture, blood (routine x 2)     Status: None   Collection Time: 09/08/19 12:12 PM   Specimen: BLOOD LEFT HAND  Result Value Ref Range Status   Specimen Description BLOOD LEFT HAND  Final   Special Requests   Final    BOTTLES DRAWN AEROBIC AND ANAEROBIC Blood Culture adequate volume   Culture   Final    NO GROWTH 5 DAYS Performed at [facility], [place] [address] [place], [place] [address]    Report Status 09/13/2019 FINAL  Final  Culture, blood (routine x 2)     Status: None   Collection Time: 09/08/19 12:20 PM   Specimen: BLOOD RIGHT HAND  Result Value Ref Range Status   Specimen Description BLOOD RIGHT HAND  Final   Special Requests   Final    BOTTLES DRAWN AEROBIC ONLY Blood Culture results may not be optimal due to an inadequate volume of blood received in culture bottles   Culture   Final    NO GROWTH 5 DAYS Performed at [facility], [place] [address] [place], [place] [address]    Report Status 09/13/2019 FINAL  Final  MRSA PCR Screening     Status: None   Collection Time: 09/08/19  1:09 PM   Specimen: Nasal Mucosa; Nasopharyngeal  Result Value Ref Range Status   MRSA by PCR NEGATIVE NEGATIVE Final    Comment:        The GeneXpert MRSA Assay (FDA approved for NASAL specimens only), is one component of a comprehensive MRSA colonization surveillance program. It is not intended to diagnose MRSA infection nor to guide or monitor treatment for MRSA infections. Performed at [facility],  [place] [address] [place],  [address]   Culture, respiratory     Status: None   Collection Time: 09/08/19  2:33 PM   Specimen: Tracheal Aspirate  Result Value Ref Range Status   Specimen Description TRACHEAL ASPIRATE  Final   Special Requests NONE  Final   Gram Stain   Final    MODERATE WBC PRESENT, PREDOMINANTLY PMN RARE SQUAMOUS EPITHELIAL CELLS PRESENT ABUNDANT GRAM  POSITIVE COCCI MODERATE GRAM NEGATIVE RODS RARE GRAM POSITIVE RODS Performed at [facility], [place] [address] [place], [place] [address]    Culture   Final    MODERATE GROUP B STREP(S.AGALACTIAE)ISOLATED TESTING AGAINST S. AGALACTIAE NOT ROUTINELY PERFORMED DUE TO PREDICTABILITY OF AMP/PEN/VAN SUSCEPTIBILITY. FEW PSEUDOMONAS AERUGINOSA    Report Status 09/12/2019 FINAL  Final   Organism ID, Bacteria PSEUDOMONAS AERUGINOSA  Final      Susceptibility   Pseudomonas aeruginosa - MIC*    CEFTAZIDIME 4 SENSITIVE Sensitive     CIPROFLOXACIN >=4 RESISTANT Resistant     GENTAMICIN 2 SENSITIVE Sensitive     IMIPENEM >=16 RESISTANT Resistant     PIP/TAZO 8 SENSITIVE Sensitive     CEFEPIME 4 SENSITIVE Sensitive     * FEW PSEUDOMONAS AERUGINOSA  Culture, blood (routine x 2)     Status: None (Preliminary result)   Collection Time: 09/13/19 12:47 PM   Specimen: BLOOD  Result Value Ref Range Status   Specimen Description BLOOD RIGHT ANTECUBITAL  Final   Special Requests   Final    BOTTLES DRAWN AEROBIC ONLY Blood Culture results may not be optimal due to an inadequate volume of blood received in culture bottles   Culture   Final    NO GROWTH 4 DAYS Performed at [facility], [place] [address] [place], [place] [address]    Report Status PENDING  Incomplete  Culture, blood (routine x 2)     Status: None (Preliminary result)   Collection Time: 09/13/19 12:50 PM   Specimen: BLOOD RIGHT ARM  Result Value Ref Range Status   Specimen Description BLOOD RIGHT ARM  Final   Special Requests   Final    BOTTLES DRAWN  AEROBIC ONLY Blood Culture results may not be optimal due to an inadequate volume of blood received in culture bottles   Culture   Final    NO GROWTH 4 DAYS Performed at [facility], [place] [address], [place], [place] [address]    Report Status PENDING  Incomplete  Culture, Urine     Status: None   Collection Time: 09/13/19  2:00 PM   Specimen: Urine, Catheterized  Result Value Ref Range Status   Specimen Description URINE, CATHETERIZED  Final   Special Requests NONE  Final   Culture   Final    NO GROWTH Performed at [facility], [address] [place], [place] [address]    Report Status 09/14/2019 FINAL  Final  Culture, respiratory     Status: None   Collection Time: 09/14/19  2:46 PM   Specimen: Tracheal Aspirate  Result Value Ref Range Status   Specimen Description TRACHEAL ASPIRATE  Final   Special Requests NONE  Final   Gram Stain   Final    ABUNDANT WBC PRESENT,BOTH PMN AND MONONUCLEAR FEW GRAM POSITIVE COCCI MODERATE YEAST    Culture   Final    FEW PSEUDOMONAS AERUGINOSA FEW CANDIDA ALBICANS Standardized susceptibility testing for this organism is not available. Performed at [facility], [place] [address] [place],  [address]    Report Status 09/17/2019 FINAL  Final   Organism ID, Bacteria PSEUDOMONAS AERUGINOSA  Final      Susceptibility   Pseudomonas aeruginosa - MIC*    CEFTAZIDIME 8 SENSITIVE Sensitive     CIPROFLOXACIN >=4 RESISTANT Resistant     GENTAMICIN 4 SENSITIVE Sensitive     IMIPENEM >=16 RESISTANT Resistant     PIP/TAZO 8 SENSITIVE  Sensitive     CEFEPIME 4 SENSITIVE Sensitive     * FEW PSEUDOMONAS AERUGINOSA  Aerobic Culture (superficial specimen)     Status: None (Preliminary result)   Collection Time: 09/15/19 11:22 AM   Specimen: Wound  Result Value Ref Range Status   Specimen Description WOUND SACRAL  Final   Special Requests Normal  Final   Gram Stain   Final    NO WBC SEEN MODERATE GRAM NEGATIVE RODS RARE GRAM  POSITIVE COCCI Performed at [facility], [address] [place], [place] [address]    Culture ABUNDANT PSEUDOMONAS AERUGINOSA  Final   Report Status PENDING  Incomplete      Studies: DG Abd 1 View  Result Date: 09/16/2019 CLINICAL DATA:  Pneumonia. EXAM: ABDOMEN - 1 VIEW; DG NASO G TUBE PLC W/FL-NO RAD COMPARISON:  CT of earlier in the day. FINDINGS: Single view of the abdomen, with the patient oblique . This confirms placement of a feeding tube into the mid duodenum. This was performed by the technologist using fluoroscopy and 15 cc of Omnipaque 300. IMPRESSION: Feeding tube terminating in the duodenum. Electronically Signed   By: Abigail Miyamoto M.D.   On: 09/16/2019 11:14   [place] CHEST (PLEURAL EFFUSION)  Result Date: 09/17/2019 CLINICAL DATA:  History of COPD now with COVID-19 pneumonia and shortness of breath. Please perform chest ultrasound and ultrasound-guided thoracentesis as indicated. EXAM: CHEST ULTRASOUND COMPARISON:  Chest radiograph-09/16/2019; chest CT-09/15/2019 FINDINGS: Sonographic evaluation of the left chest demonstrates a trace left-sided pleural effusion, too small to allow for safe ultrasound-guided thoracentesis. No thoracentesis attempted. IMPRESSION: Trace left-sided pleural effusion, too small to allow for safe ultrasound-guided thoracentesis. No thoracentesis attempted. Electronically Signed   By: Sandi Mariscal M.D.   On: 09/17/2019 09:31   DG Chest Port 1 View  Result Date: 09/16/2019 CLINICAL DATA:  Shortness of breath. COVID-19 pneumonia. COPD. Hypertension. Diabetes. EXAM: PORTABLE CHEST 1 VIEW COMPARISON:  CT of 1 day prior. Most recent plain film of 2 days prior. FINDINGS: Tracheostomy appropriately position. Volume loss in the right hemithorax with mediastinal shift to the right. Feeding tube extends beyond the inferior aspect of the film. Mild cardiomegaly. Mild interstitial edema. Small left pleural effusion versus pleural thickening, similar. No pneumothorax.  Minimal improvement in bibasilar aeration, with patchy airspace disease remaining. Left axillary surgical clips. IMPRESSION: Improved bibasilar aeration. Persistent lower lobe predominant airspace opacities. Similar trace left pleural fluid or thickening. Cardiomegaly with persistent mild pulmonary venous congestion. Electronically Signed   By: Abigail Miyamoto M.D.   On: 09/16/2019 14:06   DG Naso G Tube Plc W/Fl-No Rad  Result Date: 09/16/2019 CLINICAL DATA:  Pneumonia. EXAM: ABDOMEN - 1 VIEW; DG NASO G TUBE PLC W/FL-NO RAD COMPARISON:  CT of earlier in the day. FINDINGS: Single view of the abdomen, with the patient oblique . This confirms placement of a feeding tube into the mid duodenum. This was performed by the technologist using fluoroscopy and 15 cc of Omnipaque 300. IMPRESSION: Feeding tube terminating in the duodenum. Electronically Signed   By: Abigail Miyamoto M.D.   On: 09/16/2019 11:14    Scheduled Meds: . apixaban  5 mg Per Tube BID  . artificial tears   Both Eyes Q8H  . chlorhexidine  15 mL Mouth/Throat BID  . Chlorhexidine Gluconate Cloth  6 each Topical Daily  . collagenase   Topical Daily  . diltiazem  90 mg Per Tube Q6H  . feeding supplement (PRO-STAT SUGAR FREE 64)  60  mL Per Tube Daily  . folic acid  1 mg Per Tube Daily  . free water  400 mL Per Tube Q4H  . insulin aspart  0-20 Units Subcutaneous Q4H  . insulin aspart  5 Units Subcutaneous Q4H  . insulin glargine  45 Units Subcutaneous BID  . ipratropium  0.5 mg Nebulization Q6H  . levalbuterol  0.63 mg Nebulization Q6H  . mouth rinse  15 mL Mouth Rinse 10 times per day  . Melatonin  3 mg Per Tube QHS  . metoprolol tartrate  100 mg Per Tube BID  . multivitamin  15 mL Per Tube Daily  . nutrition supplement (JUVEN)  1 packet Per Tube BID  . pantoprazole sodium  40 mg Per Tube Daily  . psyllium  1 packet Per Tube BID  . sodium chloride flush  10-40 mL Intracatheter Q12H  . sodium chloride flush  10-40 mL Intracatheter Q12H  .  thiamine  100 mg Per Tube Daily  . vitamin B-12  1,000 mcg Per Tube Daily    Continuous Infusions: . sodium chloride 10 mL/hr at 09/12/19 2300  . sodium chloride    . ceFEPime (MAXIPIME) IV 2 g (09/17/19 0519)  . feeding supplement (VITAL AF 1.2 CAL) 1,000 mL (09/15/19 2354)     LOS: 31 days     Cristal Deer, MD [facility]  To reach me or the doctor on call, go to: [email or web address] Password [facility]  09/17/2019, 9:38 AM

## 2019-09-17 NOTE — Progress Notes (Addendum)
Pt's wife called to check on pt's status. Then requested a tele visit. Assisted pt with tele visit.

## 2019-09-17 NOTE — Progress Notes (Signed)
IR requested by Dr. Marthenia Rolling for possible image-guided left thoracentesis.  Limited chest US revealed little to no fluid that could not be safely accessed with procedure. Images sent to Dr. Pascal Lux for review. Procedure did not occur today. Will make Dr. Kyung Bacca aware.  IR available in future if needed.   Bea Graff Jasmin Winberry, PA-C 09/17/2019, 9:15 AM

## 2019-09-18 ENCOUNTER — Inpatient Hospital Stay (HOSPITAL_COMMUNITY): Payer: Medicare Other

## 2019-09-18 DIAGNOSIS — J80 Acute respiratory distress syndrome: Secondary | ICD-10-CM

## 2019-09-18 DIAGNOSIS — U071 COVID-19: Secondary | ICD-10-CM

## 2019-09-18 DIAGNOSIS — Z515 Encounter for palliative care: Secondary | ICD-10-CM

## 2019-09-18 DIAGNOSIS — S31000D Unspecified open wound of lower back and pelvis without penetration into retroperitoneum, subsequent encounter: Secondary | ICD-10-CM

## 2019-09-18 LAB — CULTURE, BLOOD (ROUTINE X 2)
Culture: NO GROWTH
Culture: NO GROWTH

## 2019-09-18 LAB — GLUCOSE, CAPILLARY
Glucose-Capillary: 217 mg/dL — ABNORMAL HIGH (ref 70–99)
Glucose-Capillary: 141 mg/dL — ABNORMAL HIGH (ref 70–99)
Glucose-Capillary: 198 mg/dL — ABNORMAL HIGH (ref 70–99)

## 2019-09-18 MED ORDER — HALOPERIDOL LACTATE 2 MG/ML PO CONC
0.5000 mg | ORAL | Status: DC | PRN
  Filled 2019-09-18: qty 0.3

## 2019-09-18 MED ORDER — SODIUM CHLORIDE 0.9% FLUSH
3.0000 mL | Freq: Two times a day (BID) | INTRAVENOUS | Status: DC
  Administered 2019-09-18 – 2019-09-19 (×2): 3 mL via INTRAVENOUS

## 2019-09-18 MED ORDER — GLYCOPYRROLATE 0.2 MG/ML IJ SOLN
0.2000 mg | INTRAMUSCULAR | Status: DC | PRN
  Administered 2019-09-19: 0.2 mg via SUBCUTANEOUS

## 2019-09-18 MED ORDER — ACETAMINOPHEN 160 MG/5ML PO SOLN
1000.0000 mg | Freq: Three times a day (TID) | ORAL | Status: DC
  Administered 2019-09-18 – 2019-09-19 (×3): 1000 mg via ORAL
  Filled 2019-09-18 (×3): qty 40.6

## 2019-09-18 MED ORDER — LORAZEPAM 2 MG/ML IJ SOLN
0.2500 mg | Freq: Two times a day (BID) | INTRAMUSCULAR | Status: DC
  Administered 2019-09-18 – 2019-09-19 (×3): 0.25 mg via INTRAVENOUS
  Filled 2019-09-18 (×3): qty 1

## 2019-09-18 MED ORDER — SODIUM CHLORIDE 0.9 % IV SOLN: 250.0000 mL | INTRAVENOUS | Status: DC | PRN

## 2019-09-18 MED ORDER — DIPHENHYDRAMINE HCL 50 MG/ML IJ SOLN: 12.5000 mg | Freq: Four times a day (QID) | INTRAMUSCULAR | Status: DC | PRN

## 2019-09-18 MED ORDER — FUROSEMIDE 10 MG/ML IJ SOLN
40.0000 mg | Freq: Every day | INTRAMUSCULAR | Status: DC
  Administered 2019-09-18 – 2019-09-19 (×2): 40 mg via INTRAVENOUS
  Filled 2019-09-18 (×2): qty 4

## 2019-09-18 MED ORDER — ONDANSETRON 4 MG PO TBDP: 4.0000 mg | ORAL_TABLET | Freq: Four times a day (QID) | ORAL | Status: DC | PRN

## 2019-09-18 MED ORDER — GUAIFENESIN 100 MG/5ML PO SOLN: 10.0000 mL | ORAL | Status: DC | PRN

## 2019-09-18 MED ORDER — HALOPERIDOL 0.5 MG PO TABS: 0.5000 mg | ORAL_TABLET | ORAL | Status: DC | PRN

## 2019-09-18 MED ORDER — GLYCOPYRROLATE 0.2 MG/ML IJ SOLN
0.2000 mg | INTRAMUSCULAR | Status: DC | PRN
  Administered 2019-09-18: 0.2 mg via INTRAVENOUS
  Filled 2019-09-18 (×2): qty 1

## 2019-09-18 MED ORDER — FENTANYL 2500MCG IN NS 250ML (10MCG/ML) PREMIX INFUSION
200.0000 ug/h | INTRAVENOUS | Status: DC
  Administered 2019-09-18: 25 ug/h via INTRAVENOUS
  Filled 2019-09-18: qty 250

## 2019-09-18 MED ORDER — LORAZEPAM 2 MG/ML IJ SOLN: 0.5000 mg | INTRAMUSCULAR | Status: DC | PRN

## 2019-09-18 MED ORDER — HALOPERIDOL LACTATE 5 MG/ML IJ SOLN: 0.5000 mg | INTRAMUSCULAR | Status: DC | PRN

## 2019-09-18 MED ORDER — SODIUM CHLORIDE 0.9% FLUSH: 3.0000 mL | INTRAVENOUS | Status: DC | PRN

## 2019-09-18 MED ORDER — GLYCOPYRROLATE 1 MG PO TABS: 1.0000 mg | ORAL_TABLET | ORAL | Status: DC | PRN

## 2019-09-18 MED ORDER — HYDROCODONE-HOMATROPINE 5-1.5 MG/5ML PO SYRP: 5.0000 mL | ORAL_SOLUTION | ORAL | Status: DC | PRN

## 2019-09-18 MED ORDER — FENTANYL BOLUS VIA INFUSION
50.0000 ug | INTRAVENOUS | Status: DC | PRN
  Administered 2019-09-19: 100 ug via INTRAVENOUS
  Filled 2019-09-18: qty 100

## 2019-09-18 MED ORDER — POLYVINYL ALCOHOL 1.4 % OP SOLN: 1.0000 [drp] | Freq: Four times a day (QID) | OPHTHALMIC | Status: DC | PRN

## 2019-09-18 MED ORDER — BIOTENE DRY MOUTH MT LIQD: 15.0000 mL | OROMUCOSAL | Status: DC | PRN

## 2019-09-18 MED ORDER — GUAIFENESIN 100 MG/5ML PO SOLN
10.0000 mL | Freq: Four times a day (QID) | ORAL | Status: DC
  Administered 2019-09-18 – 2019-09-19 (×4): 200 mg via ORAL
  Filled 2019-09-18: qty 10
  Filled 2019-09-18 (×2): qty 5
  Filled 2019-09-18: qty 10
  Filled 2019-09-18: qty 5
  Filled 2019-09-18: qty 10

## 2019-09-18 MED ORDER — ONDANSETRON HCL 4 MG/2ML IJ SOLN: 4.0000 mg | Freq: Four times a day (QID) | INTRAMUSCULAR | Status: DC | PRN

## 2019-09-18 MED ORDER — FENTANYL BOLUS VIA INFUSION
50.0000 ug | INTRAVENOUS | Status: DC | PRN
  Administered 2019-09-18: 50 ug via INTRAVENOUS
  Filled 2019-09-18: qty 50

## 2019-09-18 NOTE — Progress Notes (Addendum)
PROGRESS NOTE  [name] [name] DOB: May 05, 1951 DOA: 07/19/2019 PCP: McLean-Scocuzza, Nino Glow, MD  HPI/Recap of past 52 hours: 69 year old male with past medical history significant for diabetes mellitus type 2 on insulin, status post BKA, nephrolithiasis, peripheral vascular disease, hypertension, hyperlipidemia, Pseudomonas pneumonia history of MRSA gastroparesis hypothyroidism fatty liver esophageal stricture diastolic CHF severe COPD carotid artery disease BPH fatty liver patient also has extensive surgical history.  He was admitted with Covid pneumonia on August 04, 2019 after receiving positive test on November 11, patient was intubated November 20, and was transferred to [facility] on August 06, 2019 then transferred to [facility] on August 18, 2019 with tracheal collar and on August 30, 2019.  Patient is remains critically ill with fever and poorly responsive.  His wife wants him full code and have everything done.  September 16, 2018  Subjective: Patient seen at bedside nurse reports that he continues to have fever.  Also in the dark exudate from his NG tube is from trauma during NG tube placement his decubitus ulcer is being treated by IV team who bedside at this time.  He also reported dark material coming from his NG tube.   09/18/2019 SUBJECTIVE :  Seen at bedside with [name] his daughter. He was made DNR by palliative care.  Palliative care, following  Assessment/Plan: Principal Problem:   Pneumonia due to COVID-19 virus Active Problems:   ANEMIA, IRON DEFICIENCY   CAD (coronary artery disease)   COPD, severe (HCC)   Gastroesophageal reflux disease with hiatal hernia   Diabetes mellitus type 2 in obese ([place])   COVID-19   ARDS (adult respiratory distress syndrome) (HCC)   Pressure injury of skin   Acute respiratory failure (HCC)   Acute encephalopathy   Acute respiratory failure with hypoxemia (HCC)   Palliative care encounter #1 fever  patient's white count is normal.  His drug overdose bradycardia is grew Pseudomonas so his antibiotics was changed from Zosyn to cefepime  2.  Diabetes mellitus uncontrolled, his sugars however getting better he is currently on Lantus 30 units twice daily with sliding scale  3.  Sacral decubitus ulcer wound care is currently following patient and doing dressing changes as currently the ulcer is unstageable.  4.  Acute respiratory failure with recent Covid pneumonia, status post remdesivir treatment..  Patient was previously intubated now on tracheal collar  5.  Acute encephalopathy.  Patient remains encephalopathic and minimally responsive  6.  Pneumonia secondary to Pseudomonas versus tracheitis.  His tracheal aspirate grew Pseudomonas aeruginosa.  Patient is currently on IV cefepime infectious disease recommendation Appreciated.  They recommend to continue cefepime for 10 days total.  7.  Patient is now DNR discussed with palliative care also discussed with her his daughter who was at bedside     Code Status: DNR  Severity of Illness: The appropriate patient status for this patient is INPATIENT. Inpatient status is judged to be reasonable and necessary in order to provide the required intensity of service to ensure the patient's safety. The patient's presenting symptoms, physical exam findings, and initial radiographic and laboratory data in the context of their chronic comorbidities is felt to place them at high risk for further clinical deterioration. Furthermore, it is not anticipated that the patient will be medically stable for discharge from the hospital within 2 midnights of admission. The following factors support the patient status of inpatient.   " The patient's presenting symptoms include severe sepsis decubitus ulcer patient  is receiving antibiotics and wound care. "   * I certify that at the point of admission it is my clinical judgment that the patient decubitus ulcer require  inpatient hospital care spanning beyond 2 midnights from the point of admission due to high intensity of service, high risk for further deterioration and high frequency of surveillance required.*    Family Communication: Daughter [name] at bedside  Disposition Plan: Guarded prognosis   Consultants:  Infectious disease  Palliative care  IR radiology  Procedures:  Trachea:  NG tube placement  Was postop done thoracentesis today but it was too little fluid on no fluid to be tapped  Antimicrobials:  Cefepime  Ceftriaxone 11/19 > 11/23 Azithro 11/19 > 11/23  Pip-tazo 11/26 >11/28 Levofloxacin 11/26 > 11/28  Aztreonam 11/28 >12/21 Ceftaz-avibactam 11/28 >12/12  Zosyn 12/24>>> Vancomycin 12/24>>>"  IV Zosyn was changed to IV cefepime   DVT prophylaxis: Apixaban   Objective: Vitals:   09/18/19 0732 09/18/19 0746 09/18/19 1054 09/18/19 1128  BP:  (!) 149/58 (!) 150/76   Pulse:  (!) 118 (!) 109 (!) 106  Resp:  (!) 23 (!) 26 (!) 29  Temp:  99.3 F (37.4 C) 98.3 F (36.8 C)   TempSrc:  Oral Oral   SpO2: 94% 97% 97% 98%  Weight:      Height:        Intake/Output Summary (Last 24 hours) at 09/18/2019 1146 Last data filed at 09/18/2019 0452 Gross per 24 hour  Intake 3597.75 ml  Output 2100 ml  Net 1497.75 ml   Filed Weights   09/10/19 0500 09/15/19 0500 09/18/19 0357  Weight: 116 kg 103 kg 107.7 kg   Body mass index is 31.33 kg/m.  Exam:  . General: 69 y.o. year-old male well developed well nourished in no acute distress.   . Cardiovascular: Regular rate and rhythm with no rubs or gallops.  No thyromegaly or JVD noted.   Marland Kitchen Respiratory: Clear to auscultation with no wheezes or rales. Good inspiratory effort. . Abdomen: Soft nontender nondistended with normal bowel sounds x4 quadrants. . Musculoskeletal: No lower extremity edema. 2/4 pulses in all 4 extremities. . Skin: Large unstageable . Psychiatry: Mood is appropriate for condition and  setting    Data Reviewed: CBC: Recent Labs  Lab 09/12/19 0325 09/14/19 0508 09/16/19 1514 09/17/19 0533  WBC 9.5 8.8 7.2 7.4  NEUTROABS  --  5.7  --   --   HGB 9.1* 8.7* 8.2* 8.5*  HCT 29.8* 29.0* 27.6* 28.5*  MCV 110.0* 112.0* 114.5* 112.2*  PLT 365 309 243 [id number]   Basic Metabolic Panel: Recent Labs  Lab 09/12/19 0325 09/13/19 0434 09/14/19 0508 09/16/19 1514  NA 146* 142 144 144  K 3.5 3.2* 3.6 4.1  CL 97* 95* 101 106  CO2 38* 35* 35* 29  GLUCOSE 106* 361* 239* 270*  BUN 54* 53* 39* 37*  CREATININE 0.77 0.76 0.55* 0.60*  CALCIUM 10.7* 10.4* 10.3 10.0  MG 2.5*  --  2.4  --   PHOS 2.6  --  1.9*  --    GFR: Estimated Creatinine Clearance: 116.9 mL/min (A) (by C-G formula based on SCr of 0.6 mg/dL (L)). Liver Function Tests: Recent Labs  Lab 09/14/19 0508 09/16/19 1514  AST  --  32  ALT  --  53*  ALKPHOS  --  89  BILITOT  --  0.7  PROT  --  6.0*  ALBUMIN 2.4* 2.3*   No results for input(s): LIPASE, AMYLASE in  the last 168 hours. No results for input(s): AMMONIA in the last 168 hours. Coagulation Profile: No results for input(s): INR, PROTIME in the last 168 hours. Cardiac Enzymes: No results for input(s): CKTOTAL, CKMB, CKMBINDEX, TROPONINI in the last 168 hours. BNP (last 3 results) No results for input(s): PROBNP in the last 8760 hours. HbA1C: No results for input(s): HGBA1C in the last 72 hours. CBG: Recent Labs  Lab 09/17/19 1708 09/17/19 1952 09/17/19 2329 09/18/19 0346 09/18/19 0756  GLUCAP 198* 193* 191* 217* 141*   Lipid Profile: No results for input(s): CHOL, HDL, LDLCALC, TRIG, CHOLHDL, LDLDIRECT in the last 72 hours. Thyroid Function Tests: No results for input(s): TSH, T4TOTAL, FREET4, T3FREE, THYROIDAB in the last 72 hours. Anemia Panel: No results for input(s): VITAMINB12, FOLATE, FERRITIN, TIBC, IRON, RETICCTPCT in the last 72 hours. Urine analysis:    Component Value Date/Time   COLORURINE YELLOW 09/13/2019 [place] 09/13/2019 1345   LABSPEC 1.016 09/13/2019 1345   PHURINE 6.0 09/13/2019 1345   GLUCOSEU >=500 (A) 09/13/2019 1345   GLUCOSEU 500 03/25/2012 1101   HGBUR NEGATIVE 09/13/2019 1345   BILIRUBINUR NEGATIVE 09/13/2019 1345   BILIRUBINUR negative 10/31/2015 1514   BILIRUBINUR neg 01/19/2013 1125   KETONESUR NEGATIVE 09/13/2019 1345   PROTEINUR NEGATIVE 09/13/2019 1345   UROBILINOGEN >=8.0 10/31/2015 1514   UROBILINOGEN 1.0 03/25/2012 1101   NITRITE NEGATIVE 09/13/2019 1345   LEUKOCYTESUR TRACE (A) 09/13/2019 1345   Sepsis Labs: @LABRCNTIP (procalcitonin:4,lacticidven:4)  ) Recent Results (from the past 240 hour(s))  Culture, blood (routine x 2)     Status: None   Collection Time: 09/08/19 12:12 PM   Specimen: BLOOD LEFT HAND  Result Value Ref Range Status   Specimen Description BLOOD LEFT HAND  Final   Special Requests   Final    BOTTLES DRAWN AEROBIC AND ANAEROBIC Blood Culture adequate volume   Culture   Final    NO GROWTH 5 DAYS Performed at [facility], [place] [address] [place], [place] [address]    Report Status 09/13/2019 FINAL  Final  Culture, blood (routine x 2)     Status: None   Collection Time: 09/08/19 12:20 PM   Specimen: BLOOD RIGHT HAND  Result Value Ref Range Status   Specimen Description BLOOD RIGHT HAND  Final   Special Requests   Final    BOTTLES DRAWN AEROBIC ONLY Blood Culture results may not be optimal due to an inadequate volume of blood received in culture bottles   Culture   Final    NO GROWTH 5 DAYS Performed at [facility], [place]  [address], [place], [place] [address]    Report Status 09/13/2019 FINAL  Final  MRSA PCR Screening     Status: None   Collection Time: 09/08/19  1:09 PM   Specimen: Nasal Mucosa; Nasopharyngeal  Result Value Ref Range Status   MRSA by PCR NEGATIVE NEGATIVE Final    Comment:        The GeneXpert MRSA Assay (FDA approved for NASAL specimens only), is one component of a comprehensive MRSA  colonization surveillance program. It is not intended to diagnose MRSA infection nor to guide or monitor treatment for MRSA infections. Performed at [facility], [place] [address] [place], [place] [address]   Culture, respiratory     Status: None   Collection Time: 09/08/19  2:33 PM   Specimen: Tracheal Aspirate  Result Value Ref Range Status   Specimen Description TRACHEAL ASPIRATE  Final   Special Requests NONE  Final   Gram Stain   Final    MODERATE WBC PRESENT, PREDOMINANTLY PMN RARE SQUAMOUS EPITHELIAL CELLS PRESENT ABUNDANT GRAM POSITIVE COCCI MODERATE GRAM NEGATIVE RODS RARE GRAM POSITIVE RODS Performed at [facility], [place] [address], [place], [place] [address]    Culture   Final    MODERATE GROUP B STREP(S.AGALACTIAE)ISOLATED TESTING AGAINST S. AGALACTIAE NOT ROUTINELY PERFORMED DUE TO PREDICTABILITY OF AMP/PEN/VAN SUSCEPTIBILITY. FEW PSEUDOMONAS AERUGINOSA    Report Status 09/12/2019 FINAL  Final   Organism ID, Bacteria PSEUDOMONAS AERUGINOSA  Final      Susceptibility   Pseudomonas aeruginosa - MIC*    CEFTAZIDIME 4 SENSITIVE Sensitive     CIPROFLOXACIN >=4 RESISTANT Resistant     GENTAMICIN 2 SENSITIVE Sensitive     IMIPENEM >=16 RESISTANT Resistant     PIP/TAZO 8 SENSITIVE Sensitive     CEFEPIME 4 SENSITIVE Sensitive     * FEW PSEUDOMONAS AERUGINOSA  Culture, blood (routine x 2)     Status: None   Collection Time: 09/13/19 12:47 PM   Specimen: BLOOD  Result Value Ref Range Status   Specimen Description BLOOD RIGHT ANTECUBITAL  Final   Special Requests   Final    BOTTLES DRAWN AEROBIC ONLY Blood Culture results may not be optimal due to an inadequate volume of blood received in culture bottles   Culture   Final    NO GROWTH 5 DAYS Performed at [facility], [place] [address] [place], [place] [address]    Report Status 09/18/2019 FINAL  Final  Culture, blood (routine x 2)     Status: None   Collection Time: 09/13/19 12:50 PM   Specimen:  BLOOD RIGHT ARM  Result Value Ref Range Status   Specimen Description BLOOD RIGHT ARM  Final   Special Requests   Final    BOTTLES DRAWN AEROBIC ONLY Blood Culture results may not be optimal due to an inadequate volume of blood received in culture bottles   Culture   Final    NO GROWTH 5 DAYS Performed at [facility], [place] [address], [place], [place] [address]    Report Status 09/18/2019 FINAL  Final  Culture, Urine     Status: None   Collection Time: 09/13/19  2:00 PM   Specimen: Urine, Catheterized  Result Value Ref Range Status   Specimen Description URINE, CATHETERIZED  Final   Special Requests NONE  Final   Culture   Final    NO GROWTH Performed at [facility], [address], [place], [place] [address]    Report Status 09/14/2019 FINAL  Final  Culture, respiratory     Status: None   Collection Time: 09/14/19  2:46 PM   Specimen: Tracheal Aspirate  Result Value Ref Range Status   Specimen Description TRACHEAL ASPIRATE  Final   Special Requests NONE  Final   Gram Stain   Final    ABUNDANT WBC PRESENT,BOTH PMN AND MONONUCLEAR FEW GRAM POSITIVE COCCI MODERATE YEAST    Culture   Final    FEW PSEUDOMONAS AERUGINOSA FEW CANDIDA ALBICANS Standardized susceptibility testing for this organism is not available. Performed at [facility], [place] [address], [place], [place] [address]    Report Status 09/17/2019 FINAL  Final   Organism ID, Bacteria PSEUDOMONAS AERUGINOSA  Final      Susceptibility   Pseudomonas aeruginosa - MIC*    CEFTAZIDIME 8 SENSITIVE Sensitive  CIPROFLOXACIN >=4 RESISTANT Resistant     GENTAMICIN 4 SENSITIVE Sensitive     IMIPENEM >=16 RESISTANT Resistant     PIP/TAZO 8 SENSITIVE Sensitive     CEFEPIME 4 SENSITIVE Sensitive     * FEW PSEUDOMONAS AERUGINOSA  Aerobic Culture (superficial specimen)     Status: None   Collection Time: 09/15/19 11:22 AM   Specimen: Wound  Result Value Ref Range Status   Specimen Description WOUND  SACRAL  Final   Special Requests Normal  Final   Gram Stain   Final    NO WBC SEEN MODERATE GRAM NEGATIVE RODS RARE GRAM POSITIVE COCCI Performed at [facility], [address] [place], [place] [address]    Culture ABUNDANT PSEUDOMONAS AERUGINOSA  Final   Report Status 09/17/2019 FINAL  Final   Organism ID, Bacteria PSEUDOMONAS AERUGINOSA  Final      Susceptibility   Pseudomonas aeruginosa - MIC*    CEFTAZIDIME >=64 RESISTANT Resistant     CIPROFLOXACIN 0.5 SENSITIVE Sensitive     GENTAMICIN 2 SENSITIVE Sensitive     IMIPENEM 2 SENSITIVE Sensitive     CEFEPIME >=32 RESISTANT Resistant     * ABUNDANT PSEUDOMONAS AERUGINOSA      Studies: DG Abd 1 View  Result Date: 09/18/2019 CLINICAL DATA:  NG tube placement. EXAM: ABDOMEN - 1 VIEW COMPARISON:  Chest radiograph 09/16/2019., chest and abdominal CT 09/15/2019 FINDINGS: Enteric tube courses in the right thorax with tip in the right upper quadrant, likely in the stomach. Patient is post esophagogastrectomy which accounts for the rightward course. Tip is more proximal than on feeding tube placement fluoroscopy 09/16/2019. IMPRESSION: 1. Enteric tube with tip below the diaphragm in the distal stomach/proximal duodenum, rightward course due to prior esophagectomy. 2. Tip is slightly more proximal than on prior fluoroscopy. Electronically Signed   By: Keith Rake M.D.   On: 09/18/2019 06:26    Scheduled Meds: . apixaban  5 mg Per Tube BID  . artificial tears   Both Eyes Q8H  . chlorhexidine  15 mL Mouth/Throat BID  . Chlorhexidine Gluconate Cloth  6 each Topical Daily  . collagenase   Topical Daily  . diltiazem  90 mg Per Tube Q6H  . feeding supplement (PRO-STAT SUGAR FREE 64)  60 mL Per Tube Daily  . folic acid  1 mg Per Tube Daily  . free water  400 mL Per Tube Q4H  . insulin aspart  0-20 Units Subcutaneous Q4H  . insulin aspart  5 Units Subcutaneous Q4H  . insulin glargine  45 Units Subcutaneous BID  . ipratropium  0.5  mg Nebulization Q6H  . levalbuterol  0.63 mg Nebulization Q6H  . mouth rinse  15 mL Mouth Rinse 10 times per day  . Melatonin  3 mg Per Tube QHS  . metoprolol tartrate  100 mg Per Tube BID  . multivitamin  15 mL Per Tube Daily  . nutrition supplement (JUVEN)  1 packet Per Tube BID  . pantoprazole sodium  40 mg Per Tube Daily  . psyllium  1 packet Per Tube BID  . sodium chloride flush  10-40 mL Intracatheter Q12H  . sodium chloride flush  10-40 mL Intracatheter Q12H  . thiamine  100 mg Per Tube Daily  . vitamin B-12  1,000 mcg Per Tube Daily    Continuous Infusions: . sodium chloride 10 mL/hr at 09/12/19 2300  . sodium chloride    . ceFEPime (MAXIPIME) IV 2 g (09/18/19 0504)  .  feeding supplement (VITAL AF 1.2 CAL) 1,000 mL (09/18/19 0925)     LOS: 94 days     Cristal Deer, MD [facility]  To reach me or the doctor on call, go to: [email or web address] Password [facility]  09/18/2019, 11:46 AM
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
## 2019-09-18 NOTE — Progress Notes (Signed)
Pt is made a DNR. At the request of PA Mellody Life of palliative care notified me that we are not to place DNR wrist band on patient per family.

## 2019-09-18 NOTE — Progress Notes (Signed)
Patient's secretion production have increased significantly throughout the night requiring constant attention from nursing and respiratory therapy staff. The secretions are a dark tarry brown stool color and creamy thick. Patient is able to cough with effective force to expel most of the secretions to clear his airway. If sputum production does not decline there is concern that he will plug off and become too difficult to manage without being on a critical care unit requiring mechanical ventilation. After patient has been lavaged and suctioned removing copious amounts of secretions, he is now on 60% trach collar with an SpO2 between 94-96% still very congested and agitated.

## 2019-09-18 NOTE — Progress Notes (Signed)
[facility]   Reason for visit: Follow up on pneumonia  Interval History: Pseudomonas on respiratory cultures noted, WBC wnl, fever curve trending down.  Day 6 cefepime   Physical Exam: Constitutional:  Vitals:   09/18/19 1054 09/18/19 1128  BP: (!) 150/76   Pulse: (!) 109 (!) 106  Resp: (!) 26 (!) 29  Temp: 98.3 F (36.8 C)   SpO2: 97% 98%      Lab Results  Component Value Date   WBC 7.4 09/17/2019   HGB 8.5 (L) 09/17/2019   HCT 28.5 (L) 09/17/2019   MCV 112.2 (H) 09/17/2019   PLT 262 09/17/2019    Lab Results  Component Value Date   CREATININE 0.60 (L) 09/16/2019   BUN 37 (H) 09/16/2019   NA 144 09/16/2019   K 4.1 09/16/2019   CL 106 09/16/2019   CO2 29 09/16/2019    Lab Results  Component Value Date   ALT 53 (H) 09/16/2019   AST 32 09/16/2019   ALKPHOS 89 09/16/2019     Microbiology: Recent Results (from the past 240 hour(s))  Culture, respiratory     Status: None   Collection Time: 09/08/19  2:33 PM   Specimen: Tracheal Aspirate  Result Value Ref Range Status   Specimen Description TRACHEAL ASPIRATE  Final   Special Requests NONE  Final   Gram Stain   Final    MODERATE WBC PRESENT, PREDOMINANTLY PMN RARE SQUAMOUS EPITHELIAL CELLS PRESENT ABUNDANT GRAM POSITIVE COCCI MODERATE GRAM NEGATIVE RODS RARE GRAM POSITIVE RODS Performed at [facility], [place] [address] [place], [place] [address]    Culture   Final    MODERATE GROUP B STREP(S.AGALACTIAE)ISOLATED TESTING AGAINST S. AGALACTIAE NOT ROUTINELY PERFORMED DUE TO PREDICTABILITY OF AMP/PEN/VAN SUSCEPTIBILITY. FEW PSEUDOMONAS AERUGINOSA    Report Status 09/12/2019 FINAL  Final   Organism ID, Bacteria PSEUDOMONAS AERUGINOSA  Final      Susceptibility   Pseudomonas aeruginosa - MIC*    CEFTAZIDIME 4 SENSITIVE Sensitive     CIPROFLOXACIN >=4 RESISTANT Resistant     GENTAMICIN 2 SENSITIVE Sensitive     IMIPENEM >=16 RESISTANT Resistant     PIP/TAZO 8  SENSITIVE Sensitive     CEFEPIME 4 SENSITIVE Sensitive     * FEW PSEUDOMONAS AERUGINOSA  Culture, blood (routine x 2)     Status: None   Collection Time: 09/13/19 12:47 PM   Specimen: BLOOD  Result Value Ref Range Status   Specimen Description BLOOD RIGHT ANTECUBITAL  Final   Special Requests   Final    BOTTLES DRAWN AEROBIC ONLY Blood Culture results may not be optimal due to an inadequate volume of blood received in culture bottles   Culture   Final    NO GROWTH 5 DAYS Performed at [facility], [place] [address] [place], [place] [address]    Report Status 09/18/2019 FINAL  Final  Culture, blood (routine x 2)     Status: None   Collection Time: 09/13/19 12:50 PM   Specimen: BLOOD RIGHT ARM  Result Value Ref Range Status   Specimen Description BLOOD RIGHT ARM  Final   Special Requests   Final    BOTTLES DRAWN AEROBIC ONLY Blood Culture results may not be optimal due to an inadequate volume of blood received in culture bottles   Culture   Final    NO GROWTH 5 DAYS Performed at [facility], [place] [address] [place], [place] [address]  Report Status 09/18/2019 FINAL  Final  Culture, Urine     Status: None   Collection Time: 09/13/19  2:00 PM   Specimen: Urine, Catheterized  Result Value Ref Range Status   Specimen Description URINE, CATHETERIZED  Final   Special Requests NONE  Final   Culture   Final    NO GROWTH Performed at [facility], [address], [place], [place] [address]    Report Status 09/14/2019 FINAL  Final  Culture, respiratory     Status: None   Collection Time: 09/14/19  2:46 PM   Specimen: Tracheal Aspirate  Result Value Ref Range Status   Specimen Description TRACHEAL ASPIRATE  Final   Special Requests NONE  Final   Gram Stain   Final    ABUNDANT WBC PRESENT,BOTH PMN AND MONONUCLEAR FEW GRAM POSITIVE COCCI MODERATE YEAST    Culture   Final    FEW PSEUDOMONAS AERUGINOSA FEW CANDIDA ALBICANS Standardized susceptibility testing for  this organism is not available. Performed at [facility], [place] [address] [place], [place] [address]    Report Status 09/17/2019 FINAL  Final   Organism ID, Bacteria PSEUDOMONAS AERUGINOSA  Final      Susceptibility   Pseudomonas aeruginosa - MIC*    CEFTAZIDIME 8 SENSITIVE Sensitive     CIPROFLOXACIN >=4 RESISTANT Resistant     GENTAMICIN 4 SENSITIVE Sensitive     IMIPENEM >=16 RESISTANT Resistant     PIP/TAZO 8 SENSITIVE Sensitive     CEFEPIME 4 SENSITIVE Sensitive     * FEW PSEUDOMONAS AERUGINOSA  Aerobic Culture (superficial specimen)     Status: None   Collection Time: 09/15/19 11:22 AM   Specimen: Wound  Result Value Ref Range Status   Specimen Description WOUND SACRAL  Final   Special Requests Normal  Final   Gram Stain   Final    NO WBC SEEN MODERATE GRAM NEGATIVE RODS RARE GRAM POSITIVE COCCI Performed at [facility], [address], [place], [place] [address]    Culture ABUNDANT PSEUDOMONAS AERUGINOSA  Final   Report Status 09/17/2019 FINAL  Final   Organism ID, Bacteria PSEUDOMONAS AERUGINOSA  Final      Susceptibility   Pseudomonas aeruginosa - MIC*    CEFTAZIDIME >=64 RESISTANT Resistant     CIPROFLOXACIN 0.5 SENSITIVE Sensitive     GENTAMICIN 2 SENSITIVE Sensitive     IMIPENEM 2 SENSITIVE Sensitive     CEFEPIME >=32 RESISTANT Resistant     * ABUNDANT PSEUDOMONAS AERUGINOSA    Impression/Plan:  1. Pneumonia - Pseudomonas.  On cefepime and can continue for 10 days total, now day 6. Stop date already in place.  Worsening secretions but on the right antibiotic.  No changes indicated.   No further ID needs so will sign off.   2. Fever - remains afebrile now.  I would anticipate intermittent fevers

## 2019-09-18 NOTE — Progress Notes (Signed)
Patient started on Fentanyl gtt and transitioned to comfort care. 22 ml of fentanyl wasted into stericycle to prime line for infusion. Alphia Moh RN witnessed waste.

## 2019-09-19 LAB — CBC WITH DIFFERENTIAL/PLATELET
Abs Immature Granulocytes: 0.03 10*3/uL (ref 0.00–0.07)
Basophils Absolute: 0.1 10*3/uL (ref 0.0–0.1)
Basophils Relative: 1 %
Eosinophils Absolute: 0.1 10*3/uL (ref 0.0–0.5)
Eosinophils Relative: 2 %
HCT: 26.2 % — ABNORMAL LOW (ref 39.0–52.0)
Hemoglobin: 7.8 g/dL — ABNORMAL LOW (ref 13.0–17.0)
Immature Granulocytes: 0 %
Lymphocytes Relative: 21 %
Lymphs Abs: 1.8 10*3/uL (ref 0.7–4.0)
MCH: 33.8 pg (ref 26.0–34.0)
MCHC: 29.8 g/dL — ABNORMAL LOW (ref 30.0–36.0)
MCV: 113.4 fL — ABNORMAL HIGH (ref 80.0–100.0)
Monocytes Absolute: 0.6 10*3/uL (ref 0.1–1.0)
Monocytes Relative: 7 %
Neutro Abs: 5.9 10*3/uL (ref 1.7–7.7)
Neutrophils Relative %: 69 %
Platelets: 254 10*3/uL (ref 150–400)
RBC: 2.31 MIL/uL — ABNORMAL LOW (ref 4.22–5.81)
RDW: 21.9 % — ABNORMAL HIGH (ref 11.5–15.5)
WBC: 8.6 10*3/uL (ref 4.0–10.5)
nRBC: 0 % (ref 0.0–0.2)

## 2019-09-19 LAB — COMPREHENSIVE METABOLIC PANEL
ALT: 53 U/L — ABNORMAL HIGH (ref 0–44)
AST: 31 U/L (ref 15–41)
Albumin: 2.2 g/dL — ABNORMAL LOW (ref 3.5–5.0)
Alkaline Phosphatase: 83 U/L (ref 38–126)
Anion gap: 8 (ref 5–15)
BUN: 35 mg/dL — ABNORMAL HIGH (ref 8–23)
CO2: 32 mmol/L (ref 22–32)
Calcium: 9.7 mg/dL (ref 8.9–10.3)
Chloride: 103 mmol/L (ref 98–111)
Creatinine, Ser: 0.58 mg/dL — ABNORMAL LOW (ref 0.61–1.24)
GFR calc Af Amer: >60 mL/min (ref >60)
GFR calc non Af Amer: >60 mL/min (ref >60)
Glucose, Bld: 228 mg/dL — ABNORMAL HIGH (ref 70–99)
Potassium: 3.6 mmol/L (ref 3.5–5.1)
Sodium: 143 mmol/L (ref 135–145)
Total Bilirubin: 0.6 mg/dL (ref 0.3–1.2)
Total Protein: 5.8 g/dL — ABNORMAL LOW (ref 6.5–8.1)

## 2019-09-19 LAB — GLUCOSE, CAPILLARY
Glucose-Capillary: <10 mg/dL — CL (ref 70–99)
Glucose-Capillary: <10 mg/dL — CL (ref 70–99)
Glucose-Capillary: <10 mg/dL — CL (ref 70–99)

## 2019-09-19 MED ORDER — GLYCOPYRROLATE 1 MG PO TABS: 1.0000 mg | ORAL_TABLET | ORAL | Status: DC | PRN

## 2019-09-19 MED ORDER — BIOTENE DRY MOUTH MT LIQD: 15.0000 mL | OROMUCOSAL | Status: DC | PRN

## 2019-09-19 MED ORDER — POLYVINYL ALCOHOL 1.4 % OP SOLN
1.0000 [drp] | Freq: Four times a day (QID) | OPHTHALMIC | Status: DC | PRN
  Filled 2019-09-19: qty 15

## 2019-09-19 MED ORDER — ONDANSETRON 4 MG PO TBDP: 4.0000 mg | ORAL_TABLET | Freq: Four times a day (QID) | ORAL | Status: DC | PRN

## 2019-09-19 MED ORDER — GLYCOPYRROLATE 0.2 MG/ML IJ SOLN: 0.2000 mg | INTRAMUSCULAR | Status: DC | PRN

## 2019-09-19 MED ORDER — HALOPERIDOL LACTATE 5 MG/ML IJ SOLN: 0.5000 mg | INTRAMUSCULAR | Status: DC | PRN

## 2019-09-19 MED ORDER — FENTANYL BOLUS VIA INFUSION
100.0000 ug | INTRAVENOUS | Status: DC | PRN
  Filled 2019-09-19: qty 150

## 2019-09-19 MED ORDER — LORAZEPAM 2 MG/ML IJ SOLN
2.0000 mg | Freq: Once | INTRAMUSCULAR | Status: AC
  Administered 2019-09-19: 2 mg via INTRAVENOUS
  Filled 2019-09-19: qty 1

## 2019-09-19 MED ORDER — HALOPERIDOL 0.5 MG PO TABS: 0.5000 mg | ORAL_TABLET | ORAL | Status: DC | PRN

## 2019-09-19 MED ORDER — LORAZEPAM 2 MG/ML IJ SOLN: 1.0000 mg | INTRAMUSCULAR | Status: DC | PRN

## 2019-09-19 MED ORDER — HYDROCODONE-HOMATROPINE 5-1.5 MG/5ML PO SYRP
5.0000 mL | ORAL_SOLUTION | Freq: Three times a day (TID) | ORAL | Status: DC
  Administered 2019-09-19: 5 mL via ORAL
  Filled 2019-09-19: qty 5

## 2019-09-19 MED ORDER — LORAZEPAM 2 MG/ML IJ SOLN: 0.5000 mg | Freq: Two times a day (BID) | INTRAMUSCULAR | Status: DC

## 2019-09-19 MED ORDER — LORAZEPAM 2 MG/ML IJ SOLN: 1.0000 mg | INTRAMUSCULAR | Status: DC

## 2019-09-19 MED ORDER — HALOPERIDOL LACTATE 2 MG/ML PO CONC
0.5000 mg | ORAL | Status: DC | PRN
  Filled 2019-09-19: qty 0.3

## 2019-09-19 MED ORDER — ONDANSETRON HCL 4 MG/2ML IJ SOLN: 4.0000 mg | Freq: Four times a day (QID) | INTRAMUSCULAR | Status: DC | PRN

## 2019-09-21 ENCOUNTER — Telehealth: Payer: Self-pay | Admitting: Internal Medicine

## 2019-09-21 LAB — GLUCOSE, CAPILLARY: Glucose-Capillary: 181 mg/dL — ABNORMAL HIGH (ref 70–99)

## 2019-09-21 NOTE — Telephone Encounter (Signed)
Called wife Codi Krahenbuhl 09/20/19 and expressed condolences  Will write her FMLA as she is my patient as well due to circumstances   [place]

## 2019-09-22 LAB — GLUCOSE, CAPILLARY: Glucose-Capillary: 249 mg/dL — ABNORMAL HIGH (ref 70–99)

## 2019-10-17 NOTE — Progress Notes (Signed)
Chaplain engaged in prayer and offered support to family at [facility] bedside.    Chaplain will follow-up as needed.

## 2019-10-17 NOTE — Discharge Summary (Signed)
.    Death Summary  [name] [name] DOB: 12-29-1950 DOA: August 07, 2019  PCP: McLean-Scocuzza, Nino Glow, MD  Admit date: August 07, 2019 Date of Death: 09/22/19 Time of Death: 12/10/1620 Notification: McLean-Scocuzza, Nino Glow, MD was not notified of death of Sep 22, 2019   History of present illness:  [name] is a 69 y.o. male with a history of COPD DM TY 2 HTN prior TIA 12-11-2015 multiple surgeries resulting in chronic pain HLD fatty liver gastroparesis prior nephrolithiasis was admitted [id number] metabolic encephalopathy spiking fevers acute hypoxic respiratory failure intubated by critical care-during hospital stay he had a tracheotomy 12/5 and had multiple sets back with altered mental status hypoxic respiratory failure [name] Pena did not improve after prolonged hospital stay complicated by pneumonia secondary to Pseudomonas multiple sacral decubiti unstageable uncontrolled diabetes mellitus and prolonged encephalopathy that was not responsive to change in diurnal cycle Palliative care saw the patient and patient was made DNR and ultimately family decided to pursue comfort measures-patient shortly expired thereafter at 38 on 09-22-19 Family requested autopsy and death certificate was signed  Final Diagnoses:  1.   Hypoxic respiratory failure secondary to Covid 19 underlying with Pseudomonas tracheitis this admission Diabetes mellitus, metabolic encephalopathy   The results of significant diagnostics from this hospitalization (including imaging, microbiology, ancillary and laboratory) are listed below for reference.    Significant Diagnostic Studies: DG Abd 1 View  Result Date: 09/18/2019 CLINICAL DATA:  NG tube placement. EXAM: ABDOMEN - 1 VIEW COMPARISON:  Chest radiograph 09/16/2019., chest and abdominal CT 09/15/2019 FINDINGS: Enteric tube courses in the right thorax with tip in the right upper quadrant, likely in the stomach. Patient is post esophagogastrectomy which  accounts for the rightward course. Tip is more proximal than on feeding tube placement fluoroscopy 09/16/2019. IMPRESSION: 1. Enteric tube with tip below the diaphragm in the distal stomach/proximal duodenum, rightward course due to prior esophagectomy. 2. Tip is slightly more proximal than on prior fluoroscopy. Electronically Signed   By: Keith Rake M.D.   On: 09/18/2019 06:26   DG Abd 1 View  Result Date: 09/16/2019 CLINICAL DATA:  Pneumonia. EXAM: ABDOMEN - 1 VIEW; DG NASO G TUBE PLC W/FL-NO RAD COMPARISON:  CT of earlier in the day. FINDINGS: Single view of the abdomen, with the patient oblique . This confirms placement of a feeding tube into the mid duodenum. This was performed by the technologist using fluoroscopy and 15 cc of Omnipaque 300. IMPRESSION: Feeding tube terminating in the duodenum. Electronically Signed   By: Abigail Miyamoto M.D.   On: 09/16/2019 11:14   CT HEAD WO CONTRAST  Result Date: 09/04/2019 CLINICAL DATA:  Mental status change. New right facial droop. EXAM: CT HEAD WITHOUT CONTRAST TECHNIQUE: Contiguous axial images were obtained from the base of the skull through the vertex without intravenous contrast. COMPARISON:  Head CT 08/09/2019. Brain MRI 08/21/2019 FINDINGS: Brain: No intracranial hemorrhage, mass effect, or midline shift. Stable atrophy and ventricular prominence from prior exams. The basilar cisterns are patent. Moderate periventricular white matter hypodensity consistent with chronic small vessel ischemia. No evidence of territorial infarct or acute ischemia. No extra-axial or intracranial fluid collection. Vascular: No hyperdense vessel Skull: No fracture or focal lesion. Sinuses/Orbits: Opacification of both mastoid air cells, unchanged. Fluid levels in the sphenoid sinuses. NG tube in place. Other: None. IMPRESSION: 1. No hemorrhage or evidence of acute ischemia. 2. Stable atrophy and chronic small vessel ischemia. Ventricular dilatation unchanged from prior MRI.  Electronically Signed  By: Keith Rake M.D.   On: 09/04/2019 23:10   CT CHEST W CONTRAST  Result Date: 09/15/2019 CLINICAL DATA:  Fever of unknown origin, COVID pneumonia 07/2019 EXAM: CT CHEST, ABDOMEN, AND PELVIS WITH CONTRAST TECHNIQUE: Multidetector CT imaging of the chest, abdomen and pelvis was performed following the standard protocol during bolus administration of intravenous contrast. CONTRAST:  143mL OMNIPAQUE IOHEXOL 300 MG/ML SOLN, additional enteric contrast COMPARISON:  CT abdomen pelvis, 12/07/2018, 11/05/2015 FINDINGS: CT CHEST FINDINGS Cardiovascular: No significant vascular findings. Normal heart size. Three-vessel coronary artery calcifications. No pericardial effusion. Mediastinum/Nodes: No enlarged mediastinal, hilar, or axillary lymph nodes. Thyroid is normal. Tracheostomy. Status post pull-through esophagectomy. Lungs/Pleura: Moderate left, small right pleural effusion with associated atelectasis or consolidation. There is extensive intermixed heterogeneous airspace disease and consolidation of the bilateral lungs with some degree of underlying scarring, particularly in the right lung, some of which is postoperative following thoracotomy and esophagectomy. Small, benign pulmonary nodule noted in the dependent right lung (series 5, image 109). Musculoskeletal: No chest wall mass or suspicious bone lesions identified. Multiple chronic fracture deformities of the right ribs and bridging callus of multiple levels. CT ABDOMEN PELVIS FINDINGS Hepatobiliary: No solid liver abnormality is seen. Sludge and/or tiny calcified gallstones in the gallbladder. No gallbladder wall thickening, or biliary dilatation. Pancreas: Unremarkable. No pancreatic ductal dilatation or surrounding inflammatory changes. Spleen: Normal in size without significant abnormality. Adrenals/Urinary Tract: Adrenal glands are unremarkable. Small nonobstructive bilateral renal calculi. Foley catheter in the urinary  bladder. The bladder is mildly thickened. Stomach/Bowel: Status post pull-through esophagectomy. Appendix is surgically absent. No evidence of bowel wall thickening, distention, or inflammatory changes. Rectal tube. Vascular/Lymphatic: Aortic atherosclerosis. No enlarged abdominal or pelvic lymph nodes. Reproductive: Mild prostatomegaly. Other: No abdominal wall hernia or abnormality. No abdominopelvic ascites. Musculoskeletal: Sacral decubitus ulcer open to bone (series 3, image 120). IMPRESSION: 1. Extensive intermixed heterogeneous airspace disease and consolidation of the bilateral lungs with some degree of underlying scarring, particularly in the right lung, some of which is postoperative following esophagectomy and pull-through. Findings are consistent with ongoing multifocal infection. 2. Moderate left, small right pleural effusions with associated atelectasis or consolidation. 3. Tracheostomy. 4. Sacral decubitus ulcer open to bone (series 3, image 120). MRI could be used to assess for bone marrow edema and osteomyelitis if desired. 5. Thickening of the urinary bladder, decompressed by Foley catheter, and likely due to chronic outlet obstruction. Correlate with urinalysis. 6. Sludge and/or tiny calcified gallstones in the gallbladder without CT findings to suggest acute cholecystitis. 7. Nonobstructive bilateral nephrolithiasis. 8. Status post gastric pull-through esophagectomy. 9. Coronary artery disease.  Aortic Atherosclerosis (ICD10-I70.0). Electronically Signed   By: Eddie Candle M.D.   On: 09/15/2019 15:18   [place] CHEST (PLEURAL EFFUSION)  Result Date: 09/17/2019 CLINICAL DATA:  History of COPD now with COVID-19 pneumonia and shortness of breath. Please perform chest ultrasound and ultrasound-guided thoracentesis as indicated. EXAM: CHEST ULTRASOUND COMPARISON:  Chest radiograph-09/16/2019; chest CT-09/15/2019 FINDINGS: Sonographic evaluation of the left chest demonstrates a trace left-sided pleural  effusion, too small to allow for safe ultrasound-guided thoracentesis. No thoracentesis attempted. IMPRESSION: Trace left-sided pleural effusion, too small to allow for safe ultrasound-guided thoracentesis. No thoracentesis attempted. Electronically Signed   By: Sandi Mariscal M.D.   On: 09/17/2019 09:31   CT ABDOMEN PELVIS W CONTRAST  Result Date: 09/15/2019 CLINICAL DATA:  Fever of unknown origin, COVID pneumonia 07/2019 EXAM: CT CHEST, ABDOMEN, AND PELVIS WITH CONTRAST TECHNIQUE: Multidetector CT imaging of the chest, abdomen and  pelvis was performed following the standard protocol during bolus administration of intravenous contrast. CONTRAST:  169mL OMNIPAQUE IOHEXOL 300 MG/ML SOLN, additional enteric contrast COMPARISON:  CT abdomen pelvis, 12/07/2018, 11/05/2015 FINDINGS: CT CHEST FINDINGS Cardiovascular: No significant vascular findings. Normal heart size. Three-vessel coronary artery calcifications. No pericardial effusion. Mediastinum/Nodes: No enlarged mediastinal, hilar, or axillary lymph nodes. Thyroid is normal. Tracheostomy. Status post pull-through esophagectomy. Lungs/Pleura: Moderate left, small right pleural effusion with associated atelectasis or consolidation. There is extensive intermixed heterogeneous airspace disease and consolidation of the bilateral lungs with some degree of underlying scarring, particularly in the right lung, some of which is postoperative following thoracotomy and esophagectomy. Small, benign pulmonary nodule noted in the dependent right lung (series 5, image 109). Musculoskeletal: No chest wall mass or suspicious bone lesions identified. Multiple chronic fracture deformities of the right ribs and bridging callus of multiple levels. CT ABDOMEN PELVIS FINDINGS Hepatobiliary: No solid liver abnormality is seen. Sludge and/or tiny calcified gallstones in the gallbladder. No gallbladder wall thickening, or biliary dilatation. Pancreas: Unremarkable. No pancreatic ductal  dilatation or surrounding inflammatory changes. Spleen: Normal in size without significant abnormality. Adrenals/Urinary Tract: Adrenal glands are unremarkable. Small nonobstructive bilateral renal calculi. Foley catheter in the urinary bladder. The bladder is mildly thickened. Stomach/Bowel: Status post pull-through esophagectomy. Appendix is surgically absent. No evidence of bowel wall thickening, distention, or inflammatory changes. Rectal tube. Vascular/Lymphatic: Aortic atherosclerosis. No enlarged abdominal or pelvic lymph nodes. Reproductive: Mild prostatomegaly. Other: No abdominal wall hernia or abnormality. No abdominopelvic ascites. Musculoskeletal: Sacral decubitus ulcer open to bone (series 3, image 120). IMPRESSION: 1. Extensive intermixed heterogeneous airspace disease and consolidation of the bilateral lungs with some degree of underlying scarring, particularly in the right lung, some of which is postoperative following esophagectomy and pull-through. Findings are consistent with ongoing multifocal infection. 2. Moderate left, small right pleural effusions with associated atelectasis or consolidation. 3. Tracheostomy. 4. Sacral decubitus ulcer open to bone (series 3, image 120). MRI could be used to assess for bone marrow edema and osteomyelitis if desired. 5. Thickening of the urinary bladder, decompressed by Foley catheter, and likely due to chronic outlet obstruction. Correlate with urinalysis. 6. Sludge and/or tiny calcified gallstones in the gallbladder without CT findings to suggest acute cholecystitis. 7. Nonobstructive bilateral nephrolithiasis. 8. Status post gastric pull-through esophagectomy. 9. Coronary artery disease.  Aortic Atherosclerosis (ICD10-I70.0). Electronically Signed   By: Eddie Candle M.D.   On: 09/15/2019 15:18   DG Chest Port 1 View  Result Date: 09/16/2019 CLINICAL DATA:  Shortness of breath. COVID-19 pneumonia. COPD. Hypertension. Diabetes. EXAM: PORTABLE CHEST 1 VIEW  COMPARISON:  CT of 1 day prior. Most recent plain film of 2 days prior. FINDINGS: Tracheostomy appropriately position. Volume loss in the right hemithorax with mediastinal shift to the right. Feeding tube extends beyond the inferior aspect of the film. Mild cardiomegaly. Mild interstitial edema. Small left pleural effusion versus pleural thickening, similar. No pneumothorax. Minimal improvement in bibasilar aeration, with patchy airspace disease remaining. Left axillary surgical clips. IMPRESSION: Improved bibasilar aeration. Persistent lower lobe predominant airspace opacities. Similar trace left pleural fluid or thickening. Cardiomegaly with persistent mild pulmonary venous congestion. Electronically Signed   By: Abigail Miyamoto M.D.   On: 09/16/2019 14:06   DG CHEST PORT 1 VIEW  Result Date: 09/14/2019 CLINICAL DATA:  Abnormal breathing patterns, check tracheostomy EXAM: PORTABLE CHEST 1 VIEW COMPARISON:  09/11/2019 FINDINGS: Tracheostomy and NG tube remain in place, unchanged. Airspace disease bilaterally, right greater than left. No significant change  since prior study. Cardiomegaly. Left PICC line remains in place, unchanged. IMPRESSION: Support devices including tracheostomy remain in stable position. Bilateral airspace disease, right greater than left, unchanged since prior study. Electronically Signed   By: Rolm Baptise M.D.   On: 09/14/2019 11:54   DG CHEST PORT 1 VIEW  Result Date: 09/11/2019 CLINICAL DATA:  Encounter for PICC line placement. EXAM: PORTABLE CHEST 1 VIEW COMPARISON:  September 10, 2019 FINDINGS: Left subclavian central venous line is identified with distal tip in the superior vena cava. There is no pneumothorax. Patchy consolidation are identified throughout the right lung and in the left mid and lung base unchanged. Tracheostomy tube is unchanged. The heart size is enlarged. IMPRESSION: 1. Left subclavian central venous line is identified with distal tip in the superior vena cava.  There is no pneumothorax. 2. Patchy consolidation of the right lung and in the left mid and lung base unchanged. Electronically Signed   By: Abelardo Diesel M.D.   On: 09/11/2019 12:15   DG CHEST PORT 1 VIEW  Result Date: 09/10/2019 CLINICAL DATA:  69 year old with chronic ventilator dependent respiratory failure, bedside nasogastric tube placement. EXAM: PORTABLE CHEST 1 VIEW COMPARISON:  09/09/2019 and earlier. FINDINGS: The nasogastric tube is looped in the pharynx with its tip in the hypopharynx. The feeding tube has been removed since yesterday. The tracheostomy tube tip is in satisfactory position below the thoracic inlet. Stable mild cardiac enlargement. Airspace opacities throughout the RIGHT lung and in the LEFT mid lung and LEFT base, unchanged. BILATERAL pleural effusions, LEFT greater than RIGHT, perhaps mildly improved since yesterday. Normal pulmonary vascularity without evidence of pulmonary edema. No new abnormalities. IMPRESSION: 1. The nasogastric tube is looped in the pharynx with its tip in the hypopharynx. 2. Stable pneumonia throughout the RIGHT lung and involving the LEFT mid lung and LEFT base. 3. BILATERAL pleural effusions, LEFT greater than RIGHT, perhaps mildly improved since yesterday. Electronically Signed   By: Evangeline Dakin M.D.   On: 09/10/2019 13:06   DG Chest Port 1 View  Result Date: 09/09/2019 CLINICAL DATA:  Acute respiratory failure/hypoxia. EXAM: PORTABLE CHEST 1 VIEW COMPARISON:  09/08/2019 FINDINGS: Tracheostomy tube in adequate position. Enteric tube courses into the region of the stomach and off the film as tip is not visualized. Lungs are adequately inflated with moderate bilateral patchy airspace opacification with slight improved aeration over the right upper lung and left midlung. Moderate size left pleural effusion and possible small amount right pleural fluid. Cardiomediastinal silhouette and remainder of the exam is unchanged. IMPRESSION: Moderate  bilateral airspace process with slight interval improved aeration over the right upper lung and left midlung. Findings likely due to multifocal infection. Moderate size left effusion and possible small amount right pleural fluid unchanged. Tubes and lines as described. Electronically Signed   By: Marin Olp M.D.   On: 09/09/2019 05:47   DG CHEST PORT 1 VIEW  Result Date: 09/08/2019 CLINICAL DATA:  Hypoxia. EXAM: PORTABLE CHEST 1 VIEW COMPARISON:  09/07/2019 FINDINGS: Tracheostomy tube in adequate position. Enteric tube courses into the region of the stomach and off the film as tip is not visualized. Lungs are adequately inflated demonstrate stable bilateral patchy airspace opacification with stable bilateral effusions. Cardiomediastinal silhouette and remainder of the exam is unchanged. IMPRESSION: Stable bilateral patchy airspace process and stable bilateral pleural effusions. Tubes and lines as described. Electronically Signed   By: Marin Olp M.D.   On: 09/08/2019 09:59   DG CHEST PORT 1 VIEW  Result Date: 09/07/2019 CLINICAL DATA:  Shortness of breath EXAM: PORTABLE CHEST 1 VIEW COMPARISON:  08/28/2019 FINDINGS: Tracheostomy, unchanged. Cardiomegaly, vascular congestion. Diffuse bilateral airspace disease and bilateral effusions are unchanged. No acute bony abnormality. IMPRESSION: Diffuse bilateral airspace disease with small bilateral effusions. No change since prior study. Electronically Signed   By: Rolm Baptise M.D.   On: 09/07/2019 08:59   DG CHEST PORT 1 VIEW  Result Date: 08/28/2019 CLINICAL DATA:  COVID positive pneumonia. EXAM: PORTABLE CHEST 1 VIEW COMPARISON:  08/26/2011 FINDINGS: Tracheostomy tube and feeding tube are unchanged. Stable enlarged cardiac silhouette. There is bilateral patchy airspace disease not improved. Potential small RIGHT effusion unchanged. IMPRESSION: 1. Stable support apparatus. 2. No interval change in diffuse bilateral lower lobe predominant airspace  disease. Electronically Signed   By: Suzy Bouchard M.D.   On: 08/28/2019 12:06   AM DG Chest Port 1 View  Result Date: 08/26/2019 CLINICAL DATA:  69 year old male with respiratory failure. Positive for COVID-19 last month. EXAM: PORTABLE CHEST 1 VIEW COMPARISON:  08/25/2019 portable chest and earlier. FINDINGS: Semiupright AP portable view at 0454 hours. Stable tracheostomy and visible feeding tube. Stable lung volumes and mediastinal contours. Coarse and confluent bilateral perihilar and lower lobe opacity remains not significantly changed from 08/24/2019. No superimposed pneumothorax. No areas of worsening ventilation. Stable visualized osseous structures. IMPRESSION: 1.  Stable lines and tubes. 2. Widespread bilateral pneumonia stable since 08/24/2019. Electronically Signed   By: Genevie Ann M.D.   On: 08/26/2019 07:57   AM DG Chest Port 1 View  Result Date: 08/25/2019 CLINICAL DATA:  Respiratory failure. EXAM: PORTABLE CHEST 1 VIEW COMPARISON:  08/24/2019 FINDINGS: Tracheostomy tube unchanged. Enteric tube courses into the region of the stomach and off the film as tip is not visualized. Lungs are adequately inflated demonstrate persistent stable consolidation over the right lung and left mid to lower lung likely multifocal pneumonia. Suggestion of small to moderate bilateral pleural effusions unchanged. Cardiomediastinal silhouette and remainder of the exam is unchanged. IMPRESSION: 1. Stable bilateral multifocal airspace process likely pneumonia. Bilateral pleural effusions unchanged 2. Tubes and lines as described. Electronically Signed   By: Marin Olp M.D.   On: 08/25/2019 07:42   AM DG Chest Port 1 View  Result Date: 08/24/2019 CLINICAL DATA:  Respiratory failure. EXAM: PORTABLE CHEST 1 VIEW COMPARISON:  Radiograph 08/22/2019 FINDINGS: Tracheostomy tube tip at the thoracic inlet. Weighted enteric tube tip below the diaphragm not included in the field of view. Slight worsening in bilateral  heterogeneous airspace disease, most prominent worsening in the left perihilar lung. Unchanged heart size and mediastinal contours. Bilateral pleural effusions are not significantly changed. No pneumothorax. Multiple remote right rib fractures. IMPRESSION: 1. Worsening bilateral airspace disease, most prominent in the left perihilar lung, likely pneumonia. 2. Unchanged bilateral pleural effusions. 3. Stable support apparatus. Electronically Signed   By: Keith Rake M.D.   On: 08/24/2019 05:53   DG Chest Port 1 View  Result Date: 08/22/2019 CLINICAL DATA:  Tracheostomy. EXAM: PORTABLE CHEST 1 VIEW COMPARISON:  09/09/2019. FINDINGS: Surgical clips again noted over the chest. Tracheostomy tube, feeding tube in stable position. Stable cardiomegaly. Diffuse bilateral pulmonary infiltrates/edema and bilateral pleural effusions again noted. IMPRESSION: 1.  Lines and tubes in stable position. 2. Diffuse bilateral pulmonary infiltrates/edema and bilateral pleural effusions again noted. No interim change. 3.  Stable cardiomegaly. Electronically Signed   By: Marcello Moores  Register   On: 08/22/2019 07:34   DG Abd Portable 1V  Result Date: 09/10/2019 CLINICAL  DATA:  69 year old male status post nasogastric tube placement EXAM: PORTABLE ABDOMEN - 1 VIEW COMPARISON:  Prior abdominal radiograph 09/05/2019 FINDINGS: The tip of the nasogastric tube projects over the mid abdomen, likely within the gastric antrum. The bowel gas pattern is nonspecific but not overtly obstructed. Interstitial and consolidative airspace opacities visualized in the lower lobes. IMPRESSION: The tip of the gastric tube is in the gastric antrum. Electronically Signed   By: Jacqulynn Cadet M.D.   On: 09/10/2019 14:09   DG Abd Portable 1V  Result Date: 09/05/2019 CLINICAL DATA:  Feeding tube placement. EXAM: PORTABLE ABDOMEN - 1 VIEW COMPARISON:  Chest x-ray 08/28/2019.  Abdomen 08/05/2019. FINDINGS: Feeding tube noted with tip well below left  hemidiaphragm, most likely in the duodenum. No bowel distention or free air. Bibasilar atelectasis/infiltrates and bilateral pleural effusions noted. Degenerative change thoracolumbar spine. IMPRESSION: . Feeding tube noted with tip well below the left hemidiaphragm, most likely in the duodenum. 2. Bilateral atelectasis/infiltrates and bilateral pleural effusions again noted. Electronically Signed   By: Marcello Moores  Register   On: 09/05/2019 14:48   DG [place] Dys Tube Plc W/Fl-No Rad  Result Date: 09/16/2019 CLINICAL DATA:  Pneumonia. EXAM: ABDOMEN - 1 VIEW; DG NASO G TUBE PLC W/FL-NO RAD COMPARISON:  CT of earlier in the day. FINDINGS: Single view of the abdomen, with the patient oblique . This confirms placement of a feeding tube into the mid duodenum. This was performed by the technologist using fluoroscopy and 15 cc of Omnipaque 300. IMPRESSION: Feeding tube terminating in the duodenum. Electronically Signed   By: Abigail Miyamoto M.D.   On: 09/16/2019 11:14   EEG adult  Result Date: 08/22/2019 Lora Havens, MD     08/22/2019 12:19 PM Patient Name: EESHAN GINTY Pena MRN: [id number] Epilepsy Attending: Lora Havens Referring Physician/Provider: Dr. Amie [place] Date:08/22/2019 Duration: 25.03 minutes Patient history: 69 year old male, Covid positive with altered mental status.  EEG to evaluate for seizures Level of alertness: Awake/lethargic AEDs during EEG study: None Technical aspects: This EEG study was done with scalp electrodes positioned according to the 10-20 International system of electrode placement. Electrical activity was acquired at a sampling rate of 500Hz  and reviewed with a high frequency filter of 70Hz  and a low frequency filter of 1Hz . EEG data were recorded continuously and digitally stored. Description: During awake state, no clear posterior dominant rhythm was seen.  EEG showed continuous generalized 2 to 5 Hz theta-delta slowing, at times with triphasic morphology.  Hyperventilation and  photic stimulation were not performed. Abnormality - Continuous slow, generalized IMPRESSION: This study is suggestive of moderate diffuse encephalopathy, nonspecific to etiology. No seizures or definite epileptiform discharges were seen throughout the recording. Priyanka O Yadav   [place] EKG SITE RITE  Result Date: 09/11/2019 If [facility] image not attached, placement could not be confirmed due to current cardiac rhythm.   Microbiology: Recent Results (from the past 240 hour(s))  Culture, blood (routine x 2)     Status: None   Collection Time: 09/13/19 12:47 PM   Specimen: BLOOD  Result Value Ref Range Status   Specimen Description BLOOD RIGHT ANTECUBITAL  Final   Special Requests   Final    BOTTLES DRAWN AEROBIC ONLY Blood Culture results may not be optimal due to an inadequate volume of blood received in culture bottles   Culture   Final    NO GROWTH 5 DAYS Performed at [facility], [place] [address], [place], [place] [address]  Report Status 09/18/2019 FINAL  Final  Culture, blood (routine x 2)     Status: None   Collection Time: 09/13/19 12:50 PM   Specimen: BLOOD RIGHT ARM  Result Value Ref Range Status   Specimen Description BLOOD RIGHT ARM  Final   Special Requests   Final    BOTTLES DRAWN AEROBIC ONLY Blood Culture results may not be optimal due to an inadequate volume of blood received in culture bottles   Culture   Final    NO GROWTH 5 DAYS Performed at [facility], [place] [address], [place], [place] [address]    Report Status 09/18/2019 FINAL  Final  Culture, Urine     Status: None   Collection Time: 09/13/19  2:00 PM   Specimen: Urine, Catheterized  Result Value Ref Range Status   Specimen Description URINE, CATHETERIZED  Final   Special Requests NONE  Final   Culture   Final    NO GROWTH Performed at [facility], [address], [place], [place] [address]    Report Status 09/14/2019 FINAL  Final  Culture, respiratory     Status: None    Collection Time: 09/14/19  2:46 PM   Specimen: Tracheal Aspirate  Result Value Ref Range Status   Specimen Description TRACHEAL ASPIRATE  Final   Special Requests NONE  Final   Gram Stain   Final    ABUNDANT WBC PRESENT,BOTH PMN AND MONONUCLEAR FEW GRAM POSITIVE COCCI MODERATE YEAST    Culture   Final    FEW PSEUDOMONAS AERUGINOSA FEW CANDIDA ALBICANS Standardized susceptibility testing for this organism is not available. Performed at [facility], [place] [address] [place], [place] [address]    Report Status 09/17/2019 FINAL  Final   Organism ID, Bacteria PSEUDOMONAS AERUGINOSA  Final      Susceptibility   Pseudomonas aeruginosa - MIC*    CEFTAZIDIME 8 SENSITIVE Sensitive     CIPROFLOXACIN >=4 RESISTANT Resistant     GENTAMICIN 4 SENSITIVE Sensitive     IMIPENEM >=16 RESISTANT Resistant     PIP/TAZO 8 SENSITIVE Sensitive     CEFEPIME 4 SENSITIVE Sensitive     * FEW PSEUDOMONAS AERUGINOSA  Aerobic Culture (superficial specimen)     Status: None   Collection Time: 09/15/19 11:22 AM   Specimen: Wound  Result Value Ref Range Status   Specimen Description WOUND SACRAL  Final   Special Requests Normal  Final   Gram Stain   Final    NO WBC SEEN MODERATE GRAM NEGATIVE RODS RARE GRAM POSITIVE COCCI Performed at [facility], [address], [place], [place] [address]    Culture ABUNDANT PSEUDOMONAS AERUGINOSA  Final   Report Status 09/17/2019 FINAL  Final   Organism ID, Bacteria PSEUDOMONAS AERUGINOSA  Final      Susceptibility   Pseudomonas aeruginosa - MIC*    CEFTAZIDIME >=64 RESISTANT Resistant     CIPROFLOXACIN 0.5 SENSITIVE Sensitive     GENTAMICIN 2 SENSITIVE Sensitive     IMIPENEM 2 SENSITIVE Sensitive     CEFEPIME >=32 RESISTANT Resistant     * ABUNDANT PSEUDOMONAS AERUGINOSA     Labs: Basic Metabolic Panel: Recent Labs  Lab 09/13/19 0434 09/14/19 0508 09/16/19 1514 2019/10/19 0739  NA 142 144 144 143  K 3.2* 3.6 4.1 3.6  CL 95* 101 106 103    CO2 35* 35* 29 32  GLUCOSE 361* 239* 270* 228*  BUN 53* 39* 37* 35*  CREATININE  0.76 0.55* 0.60* 0.58*  CALCIUM 10.4* 10.3 10.0 9.7  MG  --  2.4  --   --   PHOS  --  1.9*  --   --    Liver Function Tests: Recent Labs  Lab 09/14/19 0508 09/16/19 1514 2019-09-24 0739  AST  --  32 31  ALT  --  53* 53*  ALKPHOS  --  89 83  BILITOT  --  0.7 0.6  PROT  --  6.0* 5.8*  ALBUMIN 2.4* 2.3* 2.2*   No results for input(s): LIPASE, AMYLASE in the last 168 hours. No results for input(s): AMMONIA in the last 168 hours. CBC: Recent Labs  Lab 09/14/19 0508 09/16/19 1514 09/17/19 0533 September 24, 2019 0739  WBC 8.8 7.2 7.4 8.6  NEUTROABS 5.7  --   --  5.9  HGB 8.7* 8.2* 8.5* 7.8*  HCT 29.0* 27.6* 28.5* 26.2*  MCV 112.0* 114.5* 112.2* 113.4*  PLT 309 243 262 254   Cardiac Enzymes: No results for input(s): CKTOTAL, CKMB, CKMBINDEX, TROPONINI in the last 168 hours. D-Dimer No results for input(s): DDIMER in the last 72 hours. BNP: Invalid input(s): POCBNP CBG: Recent Labs  Lab 09/17/19 1952 09/17/19 2329 09/18/19 0346 09/18/19 0756 09/18/19 1206  GLUCAP 193* 191* 217* 141* 198*   Anemia work up No results for input(s): VITAMINB12, FOLATE, FERRITIN, TIBC, IRON, RETICCTPCT in the last 72 hours. Urinalysis    Component Value Date/Time   COLORURINE YELLOW 09/13/2019 [place] 09/13/2019 1345   LABSPEC 1.016 09/13/2019 1345   PHURINE 6.0 09/13/2019 1345   GLUCOSEU >=500 (A) 09/13/2019 1345   GLUCOSEU 500 03/25/2012 1101   HGBUR NEGATIVE 09/13/2019 1345   BILIRUBINUR NEGATIVE 09/13/2019 1345   BILIRUBINUR negative 10/31/2015 1514   BILIRUBINUR neg 01/19/2013 1125   KETONESUR NEGATIVE 09/13/2019 1345   PROTEINUR NEGATIVE 09/13/2019 1345   UROBILINOGEN >=8.0 10/31/2015 1514   UROBILINOGEN 1.0 03/25/2012 1101   NITRITE NEGATIVE 09/13/2019 1345   LEUKOCYTESUR TRACE (A) 09/13/2019 1345   Sepsis Labs Invalid input(s): PROCALCITONIN,  WBC,   LACTICIDVEN  SIGNED:  Nita Sells, MD  [facility] 09-24-2019, 4:26 PM Pager   If 7PM-7AM, please contact night-coverage [email or web address] Password [email or web address]

## 2019-10-17 NOTE — Progress Notes (Signed)
RT NOTES: Per palliative and RN, transitioned patient to room air per order for comfort care and withdrawal of life support.
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
## 2019-10-17 NOTE — Progress Notes (Signed)
I responded to support for daughter. I called her she said she was going through a lot right now. I offered her words of comfort. She asked that we visit when available. I an referring to on coming Chaplain for follow up.   Chaplain Resident Fidel Levy MA [phone]

## 2019-10-17 NOTE — Progress Notes (Signed)
Remaining Fentanyl gtt 98 mL wasted into stericycle witness by Hilary Hertz RN and Nicholes Calamity RN

## 2019-10-17 NOTE — Progress Notes (Signed)
Patient's time of death is 44. This RN and Trenda Moots RN both listened for heart and breath sounds independently for one minute. Both RNs agreed no breath sounds or heart beat was present. MD Nada Libman  and Pa Dellinger, notified.
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
## 2019-10-17 NOTE — Care Management Important Message (Signed)
Important Message  Patient Details  Name: [name] MRN: [id number] Date of Birth: 1951-08-05   Medicare Important Message Given:  Yes     Agastya Meister [address] September 21, 2019, 3:29 PM

## 2019-10-17 NOTE — Progress Notes (Signed)
Returned to bedside.    Spoke with Shauna Hugh and [name].  They have decided that they would like to completely focus on comfort and withdraw life prolonging measures.  However they requested I wait until after his sister has a chance to visit.    His sister arrived and visited at bedside.    A short while later [name] requested that I return as she was ready to withdraw life prolonging measures.  Additional comfort orders placed increasing the range of fentanyl and increasing anti-anxiety medication.  These steps are in order to wean oxygen to room air.  I anticipate his prognosis will be minutes to hours once he is on room air.  Florentina Jenny, PA-C Palliative Medicine Office:  [phone]  No charge note.

## 2019-10-17 DEATH — deceased

## 2019-11-04 ENCOUNTER — Ambulatory Visit: Payer: Medicare Other | Admitting: Internal Medicine

## 2020-04-23 ENCOUNTER — Ambulatory Visit: Payer: Medicare Other

## 2022-10-16 NOTE — Progress Notes (Signed)
This encounter was created in error - please disregard.
